# Patient Record
Sex: Male | Born: 1945 | Race: White | Hispanic: No | Marital: Married | Smoking: Former smoker
Health system: Southern US, Community
[De-identification: ages and names within clinical notes are randomized; demographics above are authoritative.]

## PROBLEM LIST (undated history)

## (undated) DIAGNOSIS — Z970 Presence of artificial eye: Secondary | ICD-10-CM

## (undated) DIAGNOSIS — Q646 Congenital diverticulum of bladder: Secondary | ICD-10-CM

## (undated) DIAGNOSIS — K759 Inflammatory liver disease, unspecified: Secondary | ICD-10-CM

## (undated) DIAGNOSIS — J309 Allergic rhinitis, unspecified: Secondary | ICD-10-CM

## (undated) DIAGNOSIS — C649 Malignant neoplasm of unspecified kidney, except renal pelvis: Secondary | ICD-10-CM

## (undated) DIAGNOSIS — L309 Dermatitis, unspecified: Secondary | ICD-10-CM

## (undated) DIAGNOSIS — Z87448 Personal history of other diseases of urinary system: Secondary | ICD-10-CM

## (undated) DIAGNOSIS — D649 Anemia, unspecified: Secondary | ICD-10-CM

## (undated) DIAGNOSIS — J189 Pneumonia, unspecified organism: Secondary | ICD-10-CM

## (undated) DIAGNOSIS — N281 Cyst of kidney, acquired: Secondary | ICD-10-CM

## (undated) DIAGNOSIS — Z8489 Family history of other specified conditions: Secondary | ICD-10-CM

## (undated) DIAGNOSIS — Z87442 Personal history of urinary calculi: Secondary | ICD-10-CM

## (undated) DIAGNOSIS — Z8719 Personal history of other diseases of the digestive system: Secondary | ICD-10-CM

## (undated) DIAGNOSIS — R51 Headache: Secondary | ICD-10-CM

## (undated) DIAGNOSIS — H5440 Blindness, one eye, unspecified eye: Secondary | ICD-10-CM

## (undated) DIAGNOSIS — T8859XA Other complications of anesthesia, initial encounter: Secondary | ICD-10-CM

## (undated) DIAGNOSIS — S42009A Fracture of unspecified part of unspecified clavicle, initial encounter for closed fracture: Secondary | ICD-10-CM

## (undated) DIAGNOSIS — T4145XA Adverse effect of unspecified anesthetic, initial encounter: Secondary | ICD-10-CM

## (undated) DIAGNOSIS — R519 Headache, unspecified: Secondary | ICD-10-CM

## (undated) DIAGNOSIS — M1990 Unspecified osteoarthritis, unspecified site: Secondary | ICD-10-CM

## (undated) DIAGNOSIS — J302 Other seasonal allergic rhinitis: Secondary | ICD-10-CM

## (undated) DIAGNOSIS — I10 Essential (primary) hypertension: Secondary | ICD-10-CM

## (undated) DIAGNOSIS — N201 Calculus of ureter: Secondary | ICD-10-CM

## (undated) HISTORY — PX: KNEE ARTHROSCOPY W/ MENISCECTOMY: SHX1879

## (undated) HISTORY — DX: Fracture of unspecified part of unspecified clavicle, initial encounter for closed fracture: S42.009A

## (undated) HISTORY — PX: FOOT FRACTURE SURGERY: SHX645

## (undated) HISTORY — PX: LUNG SURGERY: SHX703

## (undated) HISTORY — PX: CARPAL TUNNEL RELEASE: SHX101

## (undated) HISTORY — PX: LITHOTRIPSY: SUR834

## (undated) HISTORY — PX: EYE SURGERY: SHX253

## (undated) HISTORY — PX: HIP FRACTURE SURGERY: SHX118

## (undated) HISTORY — DX: Malignant neoplasm of unspecified kidney, except renal pelvis: C64.9

---

## 1993-10-03 HISTORY — PX: CERVICAL FUSION: SHX112

## 1998-08-20 ENCOUNTER — Ambulatory Visit (HOSPITAL_BASED_OUTPATIENT_CLINIC_OR_DEPARTMENT_OTHER): Admission: RE | Admit: 1998-08-20 | Discharge: 1998-08-20 | Payer: Self-pay | Admitting: General Surgery

## 1998-10-28 ENCOUNTER — Encounter: Payer: Self-pay | Admitting: Emergency Medicine

## 1998-10-28 ENCOUNTER — Emergency Department (HOSPITAL_COMMUNITY): Admission: EM | Admit: 1998-10-28 | Discharge: 1998-10-28 | Payer: Self-pay | Admitting: Emergency Medicine

## 1998-12-15 ENCOUNTER — Observation Stay (HOSPITAL_COMMUNITY): Admission: RE | Admit: 1998-12-15 | Discharge: 1998-12-16 | Payer: Self-pay | Admitting: Neurosurgery

## 1998-12-15 ENCOUNTER — Encounter: Payer: Self-pay | Admitting: Neurosurgery

## 1999-02-04 ENCOUNTER — Encounter: Payer: Self-pay | Admitting: Neurosurgery

## 1999-02-04 ENCOUNTER — Ambulatory Visit (HOSPITAL_COMMUNITY): Admission: RE | Admit: 1999-02-04 | Discharge: 1999-02-04 | Payer: Self-pay | Admitting: Neurosurgery

## 1999-06-28 ENCOUNTER — Ambulatory Visit (HOSPITAL_COMMUNITY): Admission: RE | Admit: 1999-06-28 | Discharge: 1999-06-28 | Payer: Self-pay | Admitting: Neurosurgery

## 1999-06-28 ENCOUNTER — Encounter: Payer: Self-pay | Admitting: Neurosurgery

## 1999-07-22 ENCOUNTER — Ambulatory Visit (HOSPITAL_COMMUNITY): Admission: RE | Admit: 1999-07-22 | Discharge: 1999-07-22 | Payer: Self-pay | Admitting: Neurosurgery

## 1999-07-22 ENCOUNTER — Encounter: Payer: Self-pay | Admitting: Neurosurgery

## 1999-09-21 ENCOUNTER — Ambulatory Visit (HOSPITAL_COMMUNITY): Admission: RE | Admit: 1999-09-21 | Discharge: 1999-09-21 | Payer: Self-pay | Admitting: Gastroenterology

## 2000-01-31 ENCOUNTER — Ambulatory Visit (HOSPITAL_COMMUNITY): Admission: RE | Admit: 2000-01-31 | Discharge: 2000-01-31 | Payer: Self-pay | Admitting: Orthopedic Surgery

## 2000-01-31 ENCOUNTER — Encounter: Payer: Self-pay | Admitting: Orthopedic Surgery

## 2000-02-16 ENCOUNTER — Encounter: Admission: RE | Admit: 2000-02-16 | Discharge: 2000-02-16 | Payer: Self-pay | Admitting: Urology

## 2000-02-16 ENCOUNTER — Encounter: Payer: Self-pay | Admitting: Urology

## 2000-06-09 ENCOUNTER — Encounter: Admission: RE | Admit: 2000-06-09 | Discharge: 2000-09-07 | Payer: Self-pay | Admitting: Anesthesiology

## 2001-03-07 ENCOUNTER — Encounter: Payer: Self-pay | Admitting: Sports Medicine

## 2001-03-07 ENCOUNTER — Encounter: Admission: RE | Admit: 2001-03-07 | Discharge: 2001-03-07 | Payer: Self-pay | Admitting: Family Medicine

## 2001-03-07 ENCOUNTER — Encounter: Admission: RE | Admit: 2001-03-07 | Discharge: 2001-03-07 | Payer: Self-pay | Admitting: Sports Medicine

## 2001-03-20 ENCOUNTER — Encounter: Admission: RE | Admit: 2001-03-20 | Discharge: 2001-03-20 | Payer: Self-pay | Admitting: Family Medicine

## 2001-03-27 ENCOUNTER — Encounter: Admission: RE | Admit: 2001-03-27 | Discharge: 2001-03-27 | Payer: Self-pay | Admitting: Family Medicine

## 2001-04-27 ENCOUNTER — Encounter: Admission: RE | Admit: 2001-04-27 | Discharge: 2001-04-27 | Payer: Self-pay | Admitting: Family Medicine

## 2003-01-03 ENCOUNTER — Encounter: Admission: RE | Admit: 2003-01-03 | Discharge: 2003-01-03 | Payer: Self-pay | Admitting: Family Medicine

## 2003-01-23 ENCOUNTER — Encounter: Admission: RE | Admit: 2003-01-23 | Discharge: 2003-01-23 | Payer: Self-pay | Admitting: Family Medicine

## 2003-02-27 ENCOUNTER — Ambulatory Visit (HOSPITAL_BASED_OUTPATIENT_CLINIC_OR_DEPARTMENT_OTHER): Admission: RE | Admit: 2003-02-27 | Discharge: 2003-02-28 | Payer: Self-pay | Admitting: Orthopedic Surgery

## 2003-02-27 HISTORY — PX: SHOULDER ARTHROSCOPY WITH OPEN ROTATOR CUFF REPAIR AND DISTAL CLAVICLE ACROMINECTOMY: SHX5683

## 2003-10-04 HISTORY — PX: NASAL SEPTUM SURGERY: SHX37

## 2003-11-27 ENCOUNTER — Encounter: Admission: RE | Admit: 2003-11-27 | Discharge: 2003-11-27 | Payer: Self-pay | Admitting: Sports Medicine

## 2003-11-27 ENCOUNTER — Encounter: Admission: RE | Admit: 2003-11-27 | Discharge: 2003-11-27 | Payer: Self-pay | Admitting: Family Medicine

## 2003-12-02 ENCOUNTER — Encounter: Admission: RE | Admit: 2003-12-02 | Discharge: 2003-12-02 | Payer: Self-pay | Admitting: Family Medicine

## 2004-01-01 ENCOUNTER — Encounter: Admission: RE | Admit: 2004-01-01 | Discharge: 2004-01-01 | Payer: Self-pay | Admitting: Family Medicine

## 2004-05-13 ENCOUNTER — Encounter: Admission: RE | Admit: 2004-05-13 | Discharge: 2004-05-13 | Payer: Self-pay | Admitting: Sports Medicine

## 2004-06-15 ENCOUNTER — Ambulatory Visit: Payer: Self-pay | Admitting: Family Medicine

## 2004-07-27 ENCOUNTER — Ambulatory Visit: Payer: Self-pay | Admitting: Family Medicine

## 2004-08-10 ENCOUNTER — Ambulatory Visit: Payer: Self-pay | Admitting: Family Medicine

## 2004-08-24 ENCOUNTER — Ambulatory Visit: Payer: Self-pay | Admitting: Family Medicine

## 2004-10-12 ENCOUNTER — Ambulatory Visit: Payer: Self-pay | Admitting: Family Medicine

## 2004-10-26 ENCOUNTER — Ambulatory Visit: Payer: Self-pay | Admitting: Family Medicine

## 2005-02-22 ENCOUNTER — Ambulatory Visit: Payer: Self-pay | Admitting: Family Medicine

## 2005-03-18 ENCOUNTER — Ambulatory Visit: Payer: Self-pay | Admitting: Family Medicine

## 2005-05-10 ENCOUNTER — Ambulatory Visit: Payer: Self-pay | Admitting: Family Medicine

## 2005-06-28 ENCOUNTER — Ambulatory Visit: Payer: Self-pay | Admitting: Sports Medicine

## 2005-09-13 ENCOUNTER — Ambulatory Visit: Payer: Self-pay | Admitting: Family Medicine

## 2005-12-27 ENCOUNTER — Ambulatory Visit: Payer: Self-pay | Admitting: Sports Medicine

## 2006-03-14 ENCOUNTER — Ambulatory Visit: Payer: Self-pay | Admitting: Family Medicine

## 2006-03-20 ENCOUNTER — Ambulatory Visit (HOSPITAL_COMMUNITY): Admission: RE | Admit: 2006-03-20 | Discharge: 2006-03-20 | Payer: Self-pay | Admitting: Sports Medicine

## 2006-03-22 ENCOUNTER — Ambulatory Visit: Payer: Self-pay | Admitting: Family Medicine

## 2006-05-24 ENCOUNTER — Ambulatory Visit: Payer: Self-pay | Admitting: Family Medicine

## 2006-06-26 ENCOUNTER — Ambulatory Visit: Payer: Self-pay | Admitting: Family Medicine

## 2006-08-15 ENCOUNTER — Ambulatory Visit: Payer: Self-pay | Admitting: Family Medicine

## 2006-11-30 DIAGNOSIS — J309 Allergic rhinitis, unspecified: Secondary | ICD-10-CM | POA: Insufficient documentation

## 2006-11-30 DIAGNOSIS — M899 Disorder of bone, unspecified: Secondary | ICD-10-CM | POA: Insufficient documentation

## 2006-11-30 DIAGNOSIS — M949 Disorder of cartilage, unspecified: Secondary | ICD-10-CM

## 2006-11-30 DIAGNOSIS — J45909 Unspecified asthma, uncomplicated: Secondary | ICD-10-CM

## 2006-11-30 DIAGNOSIS — M67919 Unspecified disorder of synovium and tendon, unspecified shoulder: Secondary | ICD-10-CM | POA: Insufficient documentation

## 2006-11-30 DIAGNOSIS — M719 Bursopathy, unspecified: Secondary | ICD-10-CM

## 2006-11-30 DIAGNOSIS — M479 Spondylosis, unspecified: Secondary | ICD-10-CM | POA: Insufficient documentation

## 2006-11-30 HISTORY — DX: Unspecified asthma, uncomplicated: J45.909

## 2007-02-28 ENCOUNTER — Telehealth (INDEPENDENT_AMBULATORY_CARE_PROVIDER_SITE_OTHER): Payer: Self-pay | Admitting: Family Medicine

## 2007-03-13 ENCOUNTER — Encounter (INDEPENDENT_AMBULATORY_CARE_PROVIDER_SITE_OTHER): Payer: Self-pay | Admitting: Family Medicine

## 2007-03-13 ENCOUNTER — Ambulatory Visit: Payer: Self-pay | Admitting: Family Medicine

## 2007-03-13 ENCOUNTER — Telehealth: Payer: Self-pay | Admitting: *Deleted

## 2007-03-13 ENCOUNTER — Encounter: Admission: RE | Admit: 2007-03-13 | Discharge: 2007-03-13 | Payer: Self-pay | Admitting: Sports Medicine

## 2007-03-13 DIAGNOSIS — M25579 Pain in unspecified ankle and joints of unspecified foot: Secondary | ICD-10-CM | POA: Insufficient documentation

## 2007-03-13 LAB — CONVERTED CEMR LAB
BUN: 15 mg/dL (ref 6–23)
CO2: 24 meq/L (ref 19–32)
Calcium: 8.7 mg/dL (ref 8.4–10.5)
Chloride: 110 meq/L (ref 96–112)
Creatinine, Ser: 0.71 mg/dL (ref 0.40–1.50)
Direct LDL: 106 mg/dL — ABNORMAL HIGH
Glucose, Bld: 75 mg/dL (ref 70–99)
HCT: 38.5 % — ABNORMAL LOW (ref 39.0–52.0)
Hemoglobin: 12.3 g/dL — ABNORMAL LOW (ref 13.0–17.0)
MCHC: 31.9 g/dL (ref 30.0–36.0)
MCV: 101 fL — ABNORMAL HIGH (ref 78.0–100.0)
Platelets: 370 10*3/uL (ref 150–400)
Potassium: 4.1 meq/L (ref 3.5–5.3)
RBC: 3.81 M/uL — ABNORMAL LOW (ref 4.22–5.81)
RDW: 13.7 % (ref 11.5–14.0)
Sodium: 143 meq/L (ref 135–145)
WBC: 9.6 10*3/uL (ref 4.0–10.5)

## 2007-03-14 ENCOUNTER — Encounter (INDEPENDENT_AMBULATORY_CARE_PROVIDER_SITE_OTHER): Payer: Self-pay | Admitting: Family Medicine

## 2007-03-21 ENCOUNTER — Encounter (INDEPENDENT_AMBULATORY_CARE_PROVIDER_SITE_OTHER): Payer: Self-pay | Admitting: Family Medicine

## 2007-03-21 ENCOUNTER — Ambulatory Visit: Payer: Self-pay | Admitting: Family Medicine

## 2007-03-22 ENCOUNTER — Encounter (INDEPENDENT_AMBULATORY_CARE_PROVIDER_SITE_OTHER): Payer: Self-pay | Admitting: Family Medicine

## 2007-03-22 ENCOUNTER — Ambulatory Visit (HOSPITAL_COMMUNITY): Admission: RE | Admit: 2007-03-22 | Discharge: 2007-03-22 | Payer: Self-pay | Admitting: Sports Medicine

## 2007-05-22 ENCOUNTER — Telehealth (INDEPENDENT_AMBULATORY_CARE_PROVIDER_SITE_OTHER): Payer: Self-pay | Admitting: Family Medicine

## 2007-06-05 ENCOUNTER — Ambulatory Visit: Payer: Self-pay | Admitting: Internal Medicine

## 2007-06-05 DIAGNOSIS — L821 Other seborrheic keratosis: Secondary | ICD-10-CM | POA: Insufficient documentation

## 2007-07-03 ENCOUNTER — Ambulatory Visit: Payer: Self-pay | Admitting: Family Medicine

## 2007-08-06 ENCOUNTER — Ambulatory Visit: Payer: Self-pay | Admitting: Family Medicine

## 2007-08-06 DIAGNOSIS — M818 Other osteoporosis without current pathological fracture: Secondary | ICD-10-CM | POA: Insufficient documentation

## 2007-08-06 DIAGNOSIS — M810 Age-related osteoporosis without current pathological fracture: Secondary | ICD-10-CM | POA: Insufficient documentation

## 2007-08-29 ENCOUNTER — Telehealth (INDEPENDENT_AMBULATORY_CARE_PROVIDER_SITE_OTHER): Payer: Self-pay | Admitting: Family Medicine

## 2007-10-10 ENCOUNTER — Ambulatory Visit: Payer: Self-pay | Admitting: Family Medicine

## 2007-10-11 ENCOUNTER — Encounter (INDEPENDENT_AMBULATORY_CARE_PROVIDER_SITE_OTHER): Payer: Self-pay | Admitting: Family Medicine

## 2007-10-12 ENCOUNTER — Telehealth: Payer: Self-pay | Admitting: *Deleted

## 2007-11-09 ENCOUNTER — Ambulatory Visit: Payer: Self-pay | Admitting: Family Medicine

## 2007-11-29 ENCOUNTER — Ambulatory Visit: Payer: Self-pay | Admitting: Family Medicine

## 2007-11-29 ENCOUNTER — Encounter (INDEPENDENT_AMBULATORY_CARE_PROVIDER_SITE_OTHER): Payer: Self-pay | Admitting: Family Medicine

## 2008-01-22 ENCOUNTER — Encounter: Admission: RE | Admit: 2008-01-22 | Discharge: 2008-01-22 | Payer: Self-pay | Admitting: Family Medicine

## 2008-01-22 ENCOUNTER — Ambulatory Visit: Payer: Self-pay | Admitting: Family Medicine

## 2008-01-25 ENCOUNTER — Telehealth: Payer: Self-pay | Admitting: *Deleted

## 2008-01-29 ENCOUNTER — Telehealth: Payer: Self-pay | Admitting: *Deleted

## 2008-01-31 ENCOUNTER — Telehealth: Payer: Self-pay | Admitting: *Deleted

## 2008-01-31 DIAGNOSIS — M47812 Spondylosis without myelopathy or radiculopathy, cervical region: Secondary | ICD-10-CM | POA: Insufficient documentation

## 2008-02-01 ENCOUNTER — Encounter (INDEPENDENT_AMBULATORY_CARE_PROVIDER_SITE_OTHER): Payer: Self-pay | Admitting: Family Medicine

## 2008-06-02 ENCOUNTER — Ambulatory Visit: Payer: Self-pay | Admitting: Family Medicine

## 2008-06-17 ENCOUNTER — Ambulatory Visit: Payer: Self-pay | Admitting: Family Medicine

## 2008-06-17 ENCOUNTER — Encounter: Payer: Self-pay | Admitting: Family Medicine

## 2008-06-17 DIAGNOSIS — G5600 Carpal tunnel syndrome, unspecified upper limb: Secondary | ICD-10-CM | POA: Insufficient documentation

## 2008-06-17 DIAGNOSIS — Z87448 Personal history of other diseases of urinary system: Secondary | ICD-10-CM | POA: Insufficient documentation

## 2008-06-17 DIAGNOSIS — Z87898 Personal history of other specified conditions: Secondary | ICD-10-CM

## 2008-06-17 HISTORY — DX: Personal history of other specified conditions: Z87.898

## 2008-07-03 ENCOUNTER — Ambulatory Visit (HOSPITAL_COMMUNITY): Admission: RE | Admit: 2008-07-03 | Discharge: 2008-07-03 | Payer: Self-pay | Admitting: Neurosurgery

## 2008-07-31 ENCOUNTER — Telehealth: Payer: Self-pay | Admitting: *Deleted

## 2008-08-18 ENCOUNTER — Encounter: Payer: Self-pay | Admitting: Family Medicine

## 2008-08-18 ENCOUNTER — Ambulatory Visit: Payer: Self-pay | Admitting: Family Medicine

## 2008-08-19 ENCOUNTER — Encounter: Payer: Self-pay | Admitting: Family Medicine

## 2008-08-19 LAB — CONVERTED CEMR LAB
BUN: 13 mg/dL (ref 6–23)
CO2: 23 meq/L (ref 19–32)
Calcium: 9.1 mg/dL (ref 8.4–10.5)
Chloride: 107 meq/L (ref 96–112)
Cholesterol: 156 mg/dL (ref 0–200)
Creatinine, Ser: 0.62 mg/dL (ref 0.40–1.50)
Glucose, Bld: 77 mg/dL (ref 70–99)
HDL: 47 mg/dL (ref >39)
LDL Cholesterol: 96 mg/dL (ref 0–99)
PSA: 1.33 ng/mL (ref 0.10–4.00)
Potassium: 4.3 meq/L (ref 3.5–5.3)
Sodium: 140 meq/L (ref 135–145)
Total CHOL/HDL Ratio: 3.3
Triglycerides: 63 mg/dL (ref <150)
VLDL: 13 mg/dL (ref 0–40)
Vit D, 1,25-Dihydroxy: 24 — ABNORMAL LOW (ref 30–89)

## 2008-08-21 ENCOUNTER — Ambulatory Visit (HOSPITAL_COMMUNITY): Admission: RE | Admit: 2008-08-21 | Discharge: 2008-08-21 | Payer: Self-pay | Admitting: Neurosurgery

## 2008-10-14 ENCOUNTER — Telehealth: Payer: Self-pay | Admitting: *Deleted

## 2008-11-14 ENCOUNTER — Encounter: Payer: Self-pay | Admitting: Family Medicine

## 2008-12-25 ENCOUNTER — Telehealth: Payer: Self-pay | Admitting: Family Medicine

## 2009-06-11 ENCOUNTER — Ambulatory Visit: Payer: Self-pay | Admitting: Family Medicine

## 2009-06-11 ENCOUNTER — Encounter: Payer: Self-pay | Admitting: Family Medicine

## 2009-06-11 DIAGNOSIS — E559 Vitamin D deficiency, unspecified: Secondary | ICD-10-CM

## 2009-06-11 DIAGNOSIS — L409 Psoriasis, unspecified: Secondary | ICD-10-CM | POA: Insufficient documentation

## 2009-06-11 HISTORY — DX: Vitamin D deficiency, unspecified: E55.9

## 2009-06-15 LAB — CONVERTED CEMR LAB: Vit D, 25-Hydroxy: 28 ng/mL — ABNORMAL LOW (ref 30–89)

## 2010-03-08 ENCOUNTER — Ambulatory Visit: Payer: Self-pay | Admitting: Family Medicine

## 2010-03-08 ENCOUNTER — Telehealth: Payer: Self-pay | Admitting: Family Medicine

## 2010-03-15 ENCOUNTER — Ambulatory Visit: Payer: Self-pay | Admitting: Family Medicine

## 2010-03-15 ENCOUNTER — Telehealth: Payer: Self-pay | Admitting: Family Medicine

## 2010-04-15 ENCOUNTER — Encounter: Payer: Self-pay | Admitting: *Deleted

## 2010-11-02 NOTE — Progress Notes (Signed)
Summary: refill  Phone Note Refill Request Call back at Home Phone [phone] Message from:  Patient  Refills Requested: Medication #1:  PROAIR HFA 108 (90 BASE) MCG/ACT  AERS 2 puffs 4 times daily as needed for shortness of breath.   Supply Requested: 6 months  Medication #2:  FLUTICASONE PROPIONATE 50 MCG/ACT SUSP Spray 1-2 spray into both nostrils twice a day.   Supply Requested: 6 months Initial call taken by: De Nurse,  December 25, 2008 11:18 AM  Follow-up for Phone Call        will forward message to MD. Follow-up by: Theresia Lo RN,  December 25, 2008 11:20 AM      Prescriptions: PROAIR HFA 108 (90 BASE) MCG/ACT  AERS (ALBUTEROL SULFATE) 2 puffs 4 times daily as needed for shortness of breath.  #4 x 1   Entered and Authorized by:   Ancil Boozer  MD   Signed by:   Ancil Boozer  MD on 12/25/2008   Method used:   [facility] to        [facility]  [address] [phone][facility] (retail[facility]       [address]       [place], [place]  [address]       Ph: [phone] or [phone]       Fax: [phone]   RxID:   [id number] FLUTICASONE PROPIONATE 50 MCG/ACT SUSP (FLUTICASONE PROPIONATE) Spray 1-2 spray into both nostrils twice a day.  #4 x 1   Entered and Authorized by:   Ancil Boozer  MD   Signed by:   Ancil Boozer  MD on 12/25/2008   Method used:   [facility]ronically to        [facility]  [address] [phone]* (retail)       [address]       [place], [place]  [address]       Ph: [phone] or [phone]       Fax: [phone]   RxID:   [id number]

## 2010-11-02 NOTE — Progress Notes (Signed)
Summary: Rx clarification  Phone Note From Pharmacy   Caller: [facility]  [address] [phone]* Summary of Call: Marylene Land [facility] [address] called with some questions about the Calcium with Vitamin D Rx they received.  She states the rx was written as SM calcium with Vitamin D 500/200 mg.  She says she is not sure what the SM means?  Does this mean small she asked.  Also she states the dosage that she has is 600/400 mg - she wants to know if this change in dosage would be ok and needs clarification on the SM abbreviation?  phone number [phone]. Initial call taken by: AMY MARTIN RN,  October 12, 2007 12:12 PM  Follow-up for Phone Call        Notified pharmacy.  Follow-up by: Rommie Dunn LPN,  October 15, 2007 9:50 AM      I am unaware of any SM on the script.  All pt needs is 1200 mg Ca and 800 International Units vit D daily for osteoporosis.    Maxwell Marion, MD

## 2010-11-02 NOTE — Miscellaneous (Signed)
Summary: call form pharmacy  Clinical Lists Changes  pharmacy called needing to verify an Rx that was sent in in Sept 2010 for Dovonex cream. they had misplaced the original . needed it for insurance audit. advised that RX was sent 06/11/2009 by Dr. Sandi Mealy  for Dovonex 0.005% apply to affected area twice daily. 60 gram   dispense #1 and 4 refills. Theresia Lo RN  April 15, 2010 9:26 AM

## 2010-11-02 NOTE — Assessment & Plan Note (Signed)
Summary: PFTs Rx Clinic   Vital Signs:  Patient Profile:   65 Years Old Male Height:     69.5 inches (175.26 cm) Weight:      153.8 pounds BMI:     22.47 Pulse rate:   107 / minute BP sitting:   128 / 83  (left arm)                 Asthma History:      He is now complaining of cough.  He denies nocturnal symptoms.  Immunizations:      Last Flu shot: Fluvax 3+ (07/04/2007)      Prior Medication List:  ADVAIR DISKUS 500-50 MCG/DOSE MISC (FLUTICASONE-SALMETEROL) Inhale 1 puff as directed twice a day ALBUTEROL 90 MCG/ACT AERS (ALBUTEROL) Inhale 2 puff using inhaler four times a day FLUTICASONE PROPIONATE 50 MCG/ACT SUSP (FLUTICASONE PROPIONATE) Spray 1 spray into both nostrils twice a day SM CALCIUM/VITAMIN D 500-200 MG-UNIT TABS (CALCIUM CARBONATE-VITAMIN D) Take 1 tablet by mouth twice a day ZYRTEC HIVES RELIEF 10 MG TABS (CETIRIZINE HCL) Take 1 tablet by mouth once a day PREDNISONE 5 MG  TABS (PREDNISONE) take 1 tab by mouth every other day BREATHERITE COLL SPACER ADULT   MISC (SPACER/AERO-HOLDING CHAMBERS) Use with inhaler at all times.  This helps to get more medicine in your lungs BONIVA 150 MG  TABS (IBANDRONATE SODIUM) 1 tab by mouth once monthly   Current Allergies (reviewed today): ! SULFA        Impression & Recommendations:  Problem # 1:  ASTHMA, UNSPECIFIED (ICD-493.90) Assessment: Deteriorated Previous smoker, 20 yph, quit in 1986.  Seen today to test for airway reversibility in hope of coming off from steriods for asthma care.  PFTs were noteworthy for moderate to severe obstruction with significant improvement in FVC an FEV1 after albuterol.  No change in Respiratory treatment plan today.    His updated medication list for this problem includes:    Advair Diskus 500-50 Mcg/dose Misc (Fluticasone-salmeterol) ..... Inhale 1 puff as directed twice a day    Albuterol 90 Mcg/act Aers (Albuterol) ..... Inhale 2 puff using inhaler four times a day  Prednisone 5 Mg Tabs (Prednisone) .Marland Kitchen... Take 1 tab by mouth every other day  Orders: Albuterol Sulfate Sol 1mg  unit dose (Z6109) PFT Baseline-Pre/Post Bronchodiolator (PFT Baseline-Pre/Pos)   Problem # 2:  OSTEOPENIA (ICD-733.90) Taking Boniva without complaint. Continues on prednisone at this time but is trying to taper off.  Willing to increase Calcium and Vitamin D to twice daily.   Consider Vitamin D level at next visit with primary MD.  His updated medication list for this problem includes:    Sm Calcium/vitamin D 500-200 Mg-unit Tabs (Calcium carbonate-vitamin d) .Marland Kitchen... Take 1 tablet by mouth twice a day    Boniva 150 Mg Tabs (Ibandronate sodium) .Marland Kitchen... 1 tab by mouth once monthly   Complete Medication List: 1)  Advair Diskus 500-50 Mcg/dose Misc (Fluticasone-salmeterol) .... Inhale 1 puff as directed twice a day 2)  Albuterol 90 Mcg/act Aers (Albuterol) .... Inhale 2 puff using inhaler four times a day 3)  Fluticasone Propionate 50 Mcg/act Susp (Fluticasone propionate) .... Spray 1-2 spray into both nostrils twice a day 4)  Sm Calcium/vitamin D 500-200 Mg-unit Tabs (Calcium carbonate-vitamin d) .... Take 1 tablet by mouth twice a day 5)  Zyrtec Hives Relief 10 Mg Tabs (Cetirizine hcl) .... Take 1 tablet by mouth once a day 6)  Prednisone 5 Mg Tabs (Prednisone) .... Take 1 tab by mouth  every other day 7)  Breatherite Coll Spacer Adult Misc (Spacer/aero-holding chambers) .... Use with inhaler at all times.  this helps to get more medicine in your lungs 8)  Boniva 150 Mg Tabs (Ibandronate sodium) .Marland Kitchen.. 1 tab by mouth once monthly  Asthma Management Plan:  Updated Asthma Medications:    Advair Diskus 500-50 Mcg/dose Misc (Fluticasone-salmeterol) ..... Inhale 1 puff as directed twice a day    Albuterol 90 Mcg/act Aers (Albuterol) ..... Inhale 2 puff using inhaler four times a day    Prednisone 5 Mg Tabs (Prednisone) .Marland Kitchen... Take 1 tab by mouth every other day    Patient Instructions:  1)  Follow up with Dr. Corliss Marcus in 2-4 weeks. 2)  Advair 500/50  one inhalation twice a day. 3)  Continue Flonase 1 spray each side twice daily.  Increase to 2 sprays each side as your seasonal allergies worsen.  4)  Continue to use your Albuterol prior to exercise. 5)  Continue to use your Albuterol as rescue.     ]    Pulmonary Function Test Date: 11/29/2007 Height (in.): 69.5 Gender: Male  Pre-Spirometry FVC    Value: 3.70 L/min   Pred: 4.11 L/min     % Pred: 89 % FEV1    Value: 1.90 L     Pred: 3.28 L     % Pred: 57 % FEV1/FVC  Value: 51 %     Pred: 80 %     % Pred: 64 % FEF 25-75  Value: 0.85 L/min   Pred: 3.33 L/min     % Pred: 25 %  Post-Spirometry FVC    Value: 4.71 L/min   Pred: 4.11 L/min     % Pred: 114 % FEV1    Value: 2.80 L     Pred: 3.28 L     % Pred: 85 % FEV1/FVC  Value: 59 %     Pred: 80 %     % Pred: 74 % FEF 25-75  Value: 1.23 L/min   Pred: 3.33 L/min     % Pred: 37 %  Comments: Effort - Exceptional   Evaluation: moderate obstruction with significant bronchodilator response  Recommendations: Continue current management plan.           Medication Administration  Medication # 1:    Medication: Albuterol Sulfate Sol 1mg  unit dose    Diagnosis: ASTHMA, UNSPECIFIED (ICD-493.90)    Route: inhaled    Exp Date: 04/02/2009    Lot #: [id number]    Mfr: Nephron    Comments: Pt. recieved 2.5mg /8mL vial    Patient tolerated medication without complications    Given by: Garen Grams LPN (November 29, 2007 10:06 AM)  Orders Added: 1)  Albuterol Sulfate Sol 1mg  unit dose [G2952] 2)  PFT Baseline-Pre/Post Bronchodiolator [PFT Baseline-Pre/Pos]

## 2010-11-02 NOTE — Miscellaneous (Signed)
  Clinical Lists Changes   This pt has an appt for a bone density scan on 03/22/07   please see red team box for hardcopy   03/21/07 Pt notified..........Marland Kitchen                gvasquez lpn

## 2010-11-02 NOTE — Progress Notes (Signed)
Summary: Triage  Phone Note Call from Patient Call back at Home Phone [phone]   Reason for Call: Talk to Nurse Summary of Call: pt seen last week for psorisis, not any better Initial call taken by: Knox Royalty,  March 15, 2010 9:35 AM  Follow-up for Phone Call        he is finished with the steroids wich helped but it is getting worse again. the infected areas are looking bad again. thinks he needs more prednisone & a different antibiotic. to come in now Follow-up by: Golden Circle RN,  March 15, 2010 9:42 AM      Vitals Entered By: Gladstone Pih (March 15, 2010 10:10 AM)

## 2010-11-02 NOTE — Letter (Signed)
Summary: Lipid Letter  [facility]  [address]   [place], [place] [address]   Phone: [phone]  Fax: [phone]    08/19/2008  Brian Leon [address] [place], [place]  [address]  Dear Brian Leon:  We have carefully reviewed your last lipid profile from 08/18/2008 and the results are noted below with a summary of recommendations for lipid management.    Cholesterol:       156     Goal: < 200   HDL "good" Cholesterol:   47     Goal: > 40   LDL "bad" Cholesterol:   96     Goal: < 130   Triglycerides:       63     Goal: < 150    Overall these are excellent numbers.  We also checked your kidney function, prostate test and vitamin D levels.  All of these were excellent except for your vitamin  D levels.  I am sending a prescription to your pharmacy for vitamin D for you to take once a week for the next month to help replete your vitamin D stores.  After this next month go back to taking 800 international units of vitamin D daily and we can recheck your levels at your next visit.    TLC Diet (Therapeutic Lifestyle Change): Saturated Fats & Transfatty acids should be kept < 7% of total calories ***Reduce Saturated Fats Polyunstaurated Fat can be up to 10% of total calories Monounsaturated Fat Fat can be up to 20% of total calories Total Fat should be no greater than 25-35% of total calories Carbohydrates should be 50-60% of total calories Protein should be approximately 15% of total calories Fiber should be at least 20-30 grams a day ***Increased fiber may help lower LDL Total Cholesterol should be < 200mg /day Consider adding plant stanol/sterols to diet (example: Benacol spread) ***A higher intake of unsaturated fat may reduce Triglycerides and Increase HDL    Adjunctive Measures (may lower LIPIDS and reduce risk of Heart Attack) include: Aerobic Exercise (20-30 minutes 3-4 times a week) Limit Alcohol Consumption Weight Reduction Aspirin 75-81 mg a day by mouth  (if not allergic or contraindicated) Vitamin E 400 IU a day by mouth Folic Acid 1mg  a day by mouth Dietary Fiber 20-30 grams a day by mouth     Current Medications: 1)    Advair Diskus 500-50 Mcg/dose Misc (Fluticasone-salmeterol) .... Inhale 1 puff as directed twice a day 2)    Fluticasone Propionate 50 Mcg/act Susp (Fluticasone propionate) .... Spray 1-2 spray into both nostrils twice a day.  last refill before visit with primary md 3)    Sm Calcium/vitamin D 500-200 Mg-unit Tabs (Calcium carbonate-vitamin d) .... Take 1 tablet by mouth twice a day 4)    Zyrtec Hives Relief 10 Mg Tabs (Cetirizine hcl) .... Take 1 tablet by mouth once a day 5)    Breatherite Coll Spacer Adult   Misc (Spacer/aero-holding chambers) .... Use with inhaler at all times.  this helps to get more medicine in your lungs 6)    Boniva 150 Mg  Tabs (Ibandronate sodium) .Marland Kitchen.. 1 tab by mouth once monthly 7)    Proair Hfa 108 (90 Base) Mcg/act  Aers (Albuterol sulfate) .... 2 puffs 4 times daily as needed for shortness of breath.  last refill before visit with primary md  If you have any questions, please call. We appreciate being able to work with you.   Sincerely,  [facility] Ancil Boozer  MD  Appended Document: Lipid Letter mailed

## 2010-11-02 NOTE — Progress Notes (Signed)
Summary: Returning Brian Leon's call  Phone Note Call from Patient Call back at Home Phone [phone]   Reason for Call: Talk to Nurse Summary of Call: pt is returning call to [facility] re: an appointment that was made Initial call taken by: ERIN LEVAN,  March 13, 2007 4:35 PM  Follow-up for Phone Call        Pt given information Follow-up by: [facility] CMA,  March 13, 2007 4:39 PM

## 2010-11-02 NOTE — Assessment & Plan Note (Signed)
Summary: asthma wp   Vital Signs:  Patient Profile:   65 Years Old Male Height:     69 inches (175.26 cm) Weight:      148.7 pounds Temp:     99.0 degrees F Pulse rate:   76 / minute BP sitting:   115 / 74  (left arm)  Pt. in pain?   no  Vitals Entered By: Jacki Cones RN (June 05, 2007 3:46 PM)                  Chief Complaint:  f/u asthma and bump on back.  History of Present Illness: Presents for asthma follow up on chronic pred 20 mg by mouth every other day for atleast 1-2 years, previously followed by Dr. Ilda Mori in pulm clinic here.  Pt states has not had any asthma flares in over 3-4 mos.  States using alb 3-4 times weekly and admits to nighttime coughing 1-2 nights per week.  No hx of previous intubations.  No known tobacco abuse.  Taking all meds withoun difficulty including, alb MDI, Advair 500/50 two times a day, flonase, zyrtec and pred 20 every other day.    Has OP sec to chronic steroid use.  Taking fosamax weekly.  Pt presents wanting to wean meds and ultimatley get off pred if possible.        Risk Factors:  Tobacco use:  quit    Year quit:  1986    Physical Exam  General:     Well-developed,well-nourished,in no acute distress; alert,appropriate and cooperative throughout examination Ears:     External ear exam shows no significant lesions or deformities.  Otoscopic examination reveals clear canals, tympanic membranes are intact bilaterally without bulging, retraction, inflammation or discharge. Hearing is grossly normal bilaterally. Nose:     External nasal examination shows no deformity or inflammation. Nasal mucosa are pink and moist without lesions or exudates. Neck:     No deformities, masses, or tenderness noted. Lungs:     No wheezing Heart:     Normal rate and regular rhythm. S1 and S2 normal without gallop, murmur, click, rub or other extra sounds.    Impression & Recommendations:  Problem # 1:  ASTHMA, UNSPECIFIED (ICD-493.90)  Assessment: Improved Mild/ Mod well controlled at current regimen.  See detailed pt instructions.  Last PFT FEV1 95 % predicted but FEV1/ FVC ratio 54% consistent with obstructive pattern as well.  May benefit repeat PFT in future once meds titrated.   The following medications were removed from the medication list:    Prednisone 5 Mg Tabs (Prednisone) .Marland Kitchen... 1 tab by mouth every other day.  keep scheduled follow up appt to address asthma    Prednisone 5 Mg Tabs (Prednisone) .Marland Kitchen... 3.5 tabs by mouth every other day for 2 weeks.    3 tabs by mouth for 2 weeks  His updated medication list for this problem includes:    Advair Diskus 500-50 Mcg/dose Misc (Fluticasone-salmeterol) ..... Inhale 1 puff as directed twice a day    Albuterol 90 Mcg/act Aers (Albuterol) ..... Inhale 2 puff using inhaler four times a day    Singulair 10 Mg Tabs (Montelukast sodium) .Marland Kitchen... Take 1 tablet by mouth once a day    Prednisone 5 Mg Tabs (Prednisone) .Marland Kitchen... Take 3.5 tabs by mouth every other day x 2 weeks  then decrease to 3 tabs by mouth every other day for 2 weeks.  Orders: [facility]- Est  Level 4 ([address]109)  Orders: [facility]- Est  Level 4 (11914)   Problem # 2:  OSTEOPENIA (ICD-733.90) Assessment: Deteriorated Osteoporosis T score -2.6 hip and -1.8 T- spine sec to chronic pred.  Cont Fosamax. Cont Ca/ Vit D 1200 mg 800 IU Vit D.   Attempt to wean pred as above.   His updated medication list for this problem includes:    Fosamax 70 Mg Tabs (Alendronate sodium) .Marland Kitchen... Take 1 tablet by mouth once a week    Sm Calcium/vitamin D 500-200 Mg-unit Tabs (Calcium carbonate-vitamin d) .Marland Kitchen... Take 1 tablet by mouth twice a day  Orders: [facility]- Est  Level 4 (78295)  Orders: [facility]- Est  Level 4 (62130)   Problem # 3:  RHINITIS, ALLERGIC (ICD-477.9) Assessment: Unchanged Stable His updated medication list for this problem includes:    Fluticasone Propionate 50 Mcg/act Susp (Fluticasone propionate) ..... Spray 1 spray into both nostrils  twice a day    Zyrtec Hives Relief 10 Mg Tabs (Cetirizine hcl) .Marland Kitchen... Take 1 tablet by mouth once a day  Orders: [facility]- Est  Level 4 (86578)   Problem # 4:  KERATOSIS, SEBORRHEIC NEC (ICD-702.19) Assessment: New small lesion on left back 0.5/0.5 cm.  Cont to follow.  No change in borders, no bleeding noticed.  Consider cryo in future if unchanged.   Orders: [facility]- Est  Level 4 (46962)   Problem # 5:  Preventive Health Care (ICD-V70.0) Inquire about colon CA screen on past.  Hgb borderline 12.3 in past.  Last LDL 115 03/2006.  Goal < 160.  Needs repeat next visit since > 1 yr.  Also gluc fasting 75 given chronic pred.    Complete Medication List: 1)  Advair Diskus 500-50 Mcg/dose Misc (Fluticasone-salmeterol) .... Inhale 1 puff as directed twice a day 2)  Albuterol 90 Mcg/act Aers (Albuterol) .... Inhale 2 puff using inhaler four times a day 3)  Fluticasone Propionate 50 Mcg/act Susp (Fluticasone propionate) .... Spray 1 spray into both nostrils twice a day 4)  Fosamax 70 Mg Tabs (Alendronate sodium) .... Take 1 tablet by mouth once a week 5)  Singulair 10 Mg Tabs (Montelukast sodium) .... Take 1 tablet by mouth once a day 6)  Sm Calcium/vitamin D 500-200 Mg-unit Tabs (Calcium carbonate-vitamin d) .... Take 1 tablet by mouth twice a day 7)  Zyrtec Hives Relief 10 Mg Tabs (Cetirizine hcl) .... Take 1 tablet by mouth once a day 8)  Prednisone 5 Mg Tabs (Prednisone) .... Take 3.5 tabs by mouth every other day x 2 weeks  then decrease to 3 tabs by mouth every other day for 2 weeks.   Patient Instructions: 1)  Follow up with primary physician in 3-4 weeks for asthma. 2)  Decrease prednisone dose as below.  DO NOT abruptly stop this medicine. 3)  Prednisone 5 mg take 3.5 tabs by mouth every other day x 2 weeks 4)  Then decrease to 5 mg tab 3 tabs by mouth every other day x 2 weeks. 5)  Return sooner for difficulty breathing or other concerns.   6)  Contiune all other meds as prescribed.       Prescriptions: PREDNISONE 5 MG  TABS (PREDNISONE) take 3.5 tabs by mouth every other day x 2 weeks  Then decrease to 3 tabs by mouth every other day for 2 weeks.  #90 x 0   Entered and Authorized by:   Glorianne Manchester MD   Signed by:   Glorianne Manchester MD on 06/05/2007   Method used:   Print then  Give to Patient   RxID:   [id number] PREDNISONE 5 MG  TABS (PREDNISONE) 3.5 tabs by mouth every other day for 2 weeks.    3 tabs by mouth for 2 weeks  #qs x 0   Entered and Authorized by:   Glorianne Manchester MD   Signed by:   Glorianne Manchester MD on 06/05/2007   Method used:   Print then Give to Patient   RxID:   [id number]   Appended Document: asthma wp Pt [facility] [address] [facility] we never gave him the rx at his appt on 9/2.  Appended Document: asthma wp Called in rx to pharmacy,  LMOVM informing pt.

## 2010-11-02 NOTE — Progress Notes (Signed)
Summary: triage  Phone Note Call from Patient Call back at Home Phone [phone]   Caller: Patient Summary of Call: Pt has rash and wondering if he can be seen today. Initial call taken by: Clydell Hakim,  March 08, 2010 10:40 AM  Follow-up for Phone Call        lm Follow-up by: Golden Circle RN,  March 08, 2010 12:04 PM  Additional Follow-up for Phone Call Additional follow up Details #1::        pt returned call. Additional Follow-up by: Clydell Hakim,  March 08, 2010 12:11 PM    Additional Follow-up for Phone Call Additional follow up Details #2::    has psorious very bad right now. did not like that last derm so he did not go back. states he has to have some relief or he will kill himself. worked in with Dr. Lafonda Mosses at 1:45 since she saw that pt this am... Follow-up by: Golden Circle RN,  March 08, 2010 12:19 PM

## 2010-11-02 NOTE — Assessment & Plan Note (Signed)
Summary: F/U VISIT/[facility]   Vital Signs:  Patient Profile:   65 Years Old Male Height:     69 inches (175.26 cm) Weight:      153 pounds Temp:     99.0 degrees F Pulse rate:   74 / minute BP sitting:   133 / 84  Pt. in pain?   no  Vitals Entered By: Jone Baseman CMA (August 06, 2007 10:40 AM)                  Chief Complaint:  f/u visit.  History of Present Illness: Presents for asthma follow up on chronic pred 20 mg by mouth every other day for atleast 1-2 years, previously followed by Dr. Ilda Mori in pulm clinic here.  Attempting slow pred wean since last visit 07/03/07.  Taking pred 5 mg 3 tabs by mouth every other day x 2 weeks, then decreased to Pred 5 mg 2.5 tabs every other day.   Asthma- using alb 3 times daily but not using spacer with med.  Advair 500/50 two times a day.  Night time symtpoms not bad at all per pts.  Awakened with wheezing twice weekly.  Also taking zyrtec daily. Not on singulair.  Has not felt any benefit from singulair in past.  Had annual eye exam ( on [address] [place].  pt states no evidence of glaucoma.  On chronic fosamax sec to OP sec to steroids.  Attmepted 5 mg 2 tabs every other day but unable to maintain this does sec to chronic pred.    Pt admits to increased itching since pred wean but also with dry skin noted on exam.  Recommend eucerin use.            Physical Exam  General:     Well-developed,well-nourished,in no acute distress; alert,appropriate and cooperative throughout examination Head:     Normocephalic and atraumatic without obvious abnormalities. No apparent alopecia or balding. Eyes:     No corneal or conjunctival inflammation noted. EOMI. Perrla. Funduscopic exam benign, without hemorrhages, exudates or papilledema. Vision grossly normal. Ears:     External ear exam shows no significant lesions or deformities.  Otoscopic examination reveals clear canals, tympanic membranes are intact bilaterally without bulging,  retraction, inflammation or discharge. Hearing is grossly normal bilaterally. Nose:     External nasal examination shows no deformity or inflammation. Nasal mucosa are pink and moist without lesions or exudates. Mouth:     Oral mucosa and oropharynx without lesions or exudates.  Teeth in good repair. Lungs:     Normal respiratory effort, chest expands symmetrically. Lungs are clear to auscultation, no crackles or wheezes. Heart:     Normal rate and regular rhythm. S1 and S2 normal without gallop, murmur, click, rub or other extra sounds. Skin:     SK on back smaleer < 0.3 cm    Impression & Recommendations:  Problem # 1:  ASTHMA, UNSPECIFIED (ICD-493.90) Assessment: Improved Cont slow pred wean see detailed pt instructions.  Mild/ Mod well controlled at current regimen.    Last PFT FEV1 95 % predicted but FEV1/ FVC ratio 54% consistent with obstructive pattern as well.  May benefit repeat PFT in future once meds titrated.  Very compliant pt.  Educated on importance of spacer use with MDI. Pt voiced understanding.  Can always call to be seen sooner if problems arise.  pt reassured.     The following medications were removed from the medication list:    Singulair 10  Mg Tabs (Montelukast sodium) .Marland Kitchen... Take 1 tablet by mouth once a day  His updated medication list for this problem includes:    Advair Diskus 500-50 Mcg/dose Misc (Fluticasone-salmeterol) ..... Inhale 1 puff as directed twice a day    Albuterol 90 Mcg/act Aers (Albuterol) ..... Inhale 2 puff using inhaler four times a day    Prednisone 5 Mg Tabs (Prednisone) .Marland Kitchen... Take 2.5  tabs by mouth every other day x 2 weeks  then decrease to 2  tabs by mouth every other day.  Orders: [facility]- Est  Level 4 (57846)    Problem # 2:  RHINITIS, ALLERGIC (ICD-477.9) Assessment: Unchanged Refill flonase/ Cont zyrtec His updated medication list for this problem includes:    Fluticasone Propionate 50 Mcg/act Susp (Fluticasone propionate)  ..... Spray 1 spray into both nostrils twice a day    Zyrtec Hives Relief 10 Mg Tabs (Cetirizine hcl) .Marland Kitchen... 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  Recommend sunscreen.  Pt works as Education administrator.   Orders: [facility]- Est  Level 4 (28413)   Problem # 4:  OTHER OSTEOPOROSIS (ICD-733.09) Assessment: Unchanged Osteoporosis T score -2.6 hip and -1.8 T- spine sec to chronic pred.  Cont Fosamax with Ca/ Vit D.    Attempt to wean pred as above.  Repeat screen in 1 yr at previous location for comparison 03/2008. [facility].   His updated medication list for this problem includes:    Fosamax 70 Mg Tabs (Alendronate sodium) .Marland Kitchen... Take 1 tablet by mouth once a week    Sm Calcium/vitamin D 500-200 Mg-unit Tabs (Calcium carbonate-vitamin d) .Marland Kitchen... Take 1 tablet by mouth twice a day  Orders: [facility]- Est  Level 4 (24401)   Problem # 5:  Preventive Health Care (ICD-V70.0) We still need colonoscopy reults and results from annual eye exam.  Asked pt to have results faxed again to day.  If do not have prior to next visit.  Sign consent for release of info for record completion etc.  Received flu shot last visit.    Complete Medication List: 1)  Advair Diskus 500-50 Mcg/dose Misc (Fluticasone-salmeterol) .... Inhale 1 puff as directed twice a day 2)  Albuterol 90 Mcg/act Aers (Albuterol) .... Inhale 2 puff using inhaler four times a day 3)  Fluticasone Propionate 50 Mcg/act Susp (Fluticasone propionate) .... Spray 1 spray into both nostrils twice a day 4)  Fosamax 70 Mg Tabs (Alendronate sodium) .... Take 1 tablet by mouth once a week 5)  Sm Calcium/vitamin D 500-200 Mg-unit Tabs (Calcium carbonate-vitamin d) .... Take 1 tablet by mouth twice a day 6)  Zyrtec Hives Relief 10 Mg Tabs (Cetirizine hcl) .... Take 1 tablet by mouth once a day  7)  Prednisone 5 Mg Tabs (Prednisone) .... Take 2.5  tabs by mouth every other day x 2 weeks  then decrease to 2  tabs by mouth every other day. 8)  Breatherite Coll Spacer Adult Misc (Spacer/aero-holding chambers) .... Use with inhaler at all times.  this helps to get more medicine in your lungs   Patient Instructions: 1)  Follow up with regular physician Renato GailsOkey Dupre) in 2 mos for asthma. 2)  Wean prednisone as discussed 2.5 tabs every other day for 2 weeks.  Then 2 tabs every other day. 3)  Cont  all other meds as prescribed 4)  Use spacer with your inhaler. 5)  Will refill. Advair/ flonase/ Pred throught the computer [facility]. 6)  Start eucerin for dry skin 7)  Have colonscopy results faxed to [place] @ [phone].    Prescriptions: FLUTICASONE PROPIONATE 50 MCG/ACT SUSP (FLUTICASONE PROPIONATE) Spray 1 spray into both nostrils twice a day  #1 canister x 2   Entered and Authorized by:   Glorianne Manchester MD   Signed by:   Glorianne Manchester MD on 08/06/2007   Method used:   Electronically sent to ...       [id number]       [address]       [place], [place]  [address]       Ph: [phone] or [phone]       Fax: [phone]   RxID:   [id number] BREATHERITE COLL SPACER ADULT   MISC (SPACER/AERO-HOLDING CHAMBERS) Use with inhaler at all times.  This helps to get more medicine in your lungs  #2 x 0   Entered and Authorized by:   Glorianne Manchester MD   Signed by:   Glorianne Manchester MD on 08/06/2007   Method used:   Electronically sent to ...       [id number]       [address]       [place], [place]  [address]       Ph: [phone] or [phone]       Fax: [phone]   RxID:   [id number] ADVAIR DISKUS 500-50 MCG/DOSE MISC (FLUTICASONE-SALMETEROL) Inhale 1 puff as directed twice a day  #1 x 1   Entered and Authorized by:   Glorianne Manchester MD   Signed by:   Glorianne Manchester MD on 08/06/2007   Method used:   Electronically sent to ...       [facility] [phone]*       [address]       [place], [place]  [address]       Ph: [phone] or [phone]       Fax: [phone]   RxID:   [id number] PREDNISONE 5 MG  TABS (PREDNISONE) take 2.5  tabs by mouth every other day x 2 weeks  Then decrease to 2  tabs by mouth every other day.  #31 x 1   Entered and Authorized by:   Glorianne Manchester MD   Signed by:   Glorianne Manchester MD on 08/06/2007   Method used:   Electronically sent to ...       [id number]       [address]       [place], [place]  [address]       Ph: [phone] or [phone]       Fax: [phone]   RxID:   [id number]  ]

## 2010-11-02 NOTE — Assessment & Plan Note (Signed)
Summary: psorisis is VERY bad per pt/[place]/alm   Vital Signs:  Patient profile:   65 year old male Height:      69.5 inches Weight:      140.13 pounds Temp:     98.3 degrees F oral Pulse rate:   74 / minute BP sitting:   135 / 77  (right arm)  Vitals Entered By: Terese Door (March 08, 2010 2:10 PM) CC: psorisis Is Patient Diabetic? No   Primary Care Provider:  Ancil Boozer  MD  CC:  psorisis.  History of Present Illness: 1.  rash--psoriasis for a long time.  seen by derm 2/10 and prescribed dovanex and clobetasol.  got some better after starting those, but  never completely resolved.  getting worse past 3 months.  very itchy.  scratching a lot.  never went back to derm.    Habits & Providers  Alcohol-Tobacco-Diet     Tobacco Status: quit > 6 months  Current Medications (verified): 1)  Proair Hfa 108 (90 Base) Mcg/act  Aers (Albuterol Sulfate) .... 2 Puffs 4 Times Daily As Needed For Shortness of Breath. 2)  Advair Diskus 500-50 Mcg/dose Misc (Fluticasone-Salmeterol) .... Inhale 1 Puff As Directed Twice A Day 3)  Fluticasone Propionate 50 Mcg/act Susp (Fluticasone Propionate) .... Spray 1-2 Spray Into Both Nostrils Twice A Day. 4)  Zyrtec Hives Relief 10 Mg Tabs (Cetirizine Hcl) .... Take 1 Tablet By Mouth Once A Day 5)  Breatherite Coll Spacer Adult   Misc (Spacer/aero-Holding Chambers) .... Use With Inhaler At All Times.  This Helps To Get More Medicine in Your Lungs 6)  Sm Calcium/vitamin D 500-200 Mg-Unit Tabs (Calcium Carbonate-Vitamin D) .... Take 1 Tablet By Mouth Twice A Day 7)  Clobetasol Propionate 0.05 % Crea (Clobetasol Propionate) .... Apply To Affected Area Two Times A Day.  Disp 60g 8)  Dovonex 0.005 % Soln (Calcipotriene) .... Apply To Affected Are Two Times A Day. Disp 60g 9)  Flomax 0.4 Mg Caps (Tamsulosin Hcl) .Marland Kitchen.. 1 By Mouth Once Daily For Prostate 10)  Prednisone 10 Mg Tabs (Prednisone) .... 3 Tabs By Mouth Daily For 7 Days 11)  Doxycycline Hyclate 100 Mg  Caps (Doxycycline Hyclate) .Marland Kitchen.. 1 Tab By Mouth Two Times A Day For 7 Days.  Allergies: 1)  ! Sulfa  Social History: Smoking Status:  quit > 6 months  Review of Systems  The patient denies fever and weight gain.   Derm:  Complains of itching and rash; denies hair loss.  Physical Exam  General:  Well-developed,well-nourished,in no acute distress; alert,appropriate and cooperative throughout examination Skin:  diffuse rash on legs arms, back.  red, with some mild scale.  lots of excoriated lesions.  no pus Additional Exam:  vital signs reviewed    Impression & Recommendations:  Problem # 1:  PSORIASIS (ICD-696.1) Assessment Deteriorated extensive.  examined and discussed with Dr. McDiarmid.  refer back to derm.  prednisone burst.  continue creams.  doxy for possible secondary infection Orders: Dermatology Referral (Derm[facility]) [facility]- Est Level  3 ([address])  Complete Medication List: 1)  Proair Hfa 108 (90 Base) Mcg/act Aers (Albuterol sulfate) .... 2 puffs 4 times daily as needed for shortness of breath. 2)  Advair Diskus 500-50 Mcg/dose Misc (Fluticasone-salmeterol) .... Inhale 1 puff as directed twice a day 3)  Fluticasone Propionate 50 Mcg/act Susp (Fluticasone propionate) .... Spray 1-2 spray into both nostrils twice a day. 4)  Zyrtec Hives Relief 10 Mg Tabs (Cetirizine hcl) .... Take 1 tablet by  mouth once a day 5)  Breatherite Coll Spacer Adult Misc (Spacer/aero-holding chambers) .... Use with inhaler at all times.  this helps to get more medicine in your lungs 6)  Sm Calcium/vitamin D 500-200 Mg-unit Tabs (Calcium carbonate-vitamin d) .... Take 1 tablet by mouth twice a day 7)  Clobetasol Propionate 0.05 % Crea (Clobetasol propionate) .... Apply to affected area two times a day.  disp 60g 8)  Dovonex 0.005 % Soln (Calcipotriene) .... Apply to affected are two times a day. disp 60g 9)  Flomax 0.4 Mg Caps (Tamsulosin hcl) .Marland Kitchen.. 1 by mouth once daily for prostate 10)  Prednisone 10 Mg  Tabs (Prednisone) .... 3 tabs by mouth daily for 7 days 11)  Doxycycline Hyclate 100 Mg Caps (Doxycycline hyclate) .Marland Kitchen.. 1 tab by mouth two times a day for 7 days.  Patient Instructions: 1)  It was nice to see you today. 2)  For your psoriasis, take the prednisone I prescribed you for the next 7 days. 3)  We will refer you to dermatology. 4)  Take the doxycycline I prescribed you in case of infection. 5)  Keep using your creams. 6)  You can take benedryl or hydroxyzine for itching Prescriptions: DOXYCYCLINE HYCLATE 100 MG CAPS (DOXYCYCLINE HYCLATE) 1 tab by mouth two times a day for 7 days.  #14 x 0   Entered and Authorized by:   Asher Muir MD   Signed by:   Asher Muir MD on 03/08/2010   Method used:   [facility]        [facility] [address] [phone][facility] (retail)       [address]       [place], [place]  [address]       Ph: [phone] or [phone]       Fax: [phone]   RxID:   [id number] PREDNISONE 10 MG TABS (PREDNISONE) 3 tabs by mouth daily for 7 days  #21 x 0   Entered and Authorized by:   Asher Muir MD   Signed by:   Asher Muir MD on 03/08/2010   Method used:   [facility]        [facility] [address] [phone]* (retail)       [address]       [place], [place]  [address]       Ph: [phone] or [phone]       Fax: [phone]   RxID:   [id number]

## 2010-11-02 NOTE — Progress Notes (Signed)
Summary: Prednisone  Phone Note Call from Patient   Summary of Call: pt has an appt on 6/10, but will run out of predisone before appt - has enough for 5 days [facility] Initial call taken by: Haydee Salter,  Feb 28, 2007 11:18 AM  Follow-up for Phone Call        Can we pull his chart? This is Dr.Crawfors patient so I am not familiar with him Follow-up by: Altamese Cabal MD,  Mar 01, 2007 9:32 AM  Additional Follow-up for Phone Call Additional follow up Details #1::        Renewed for 15 mg by mouth every other day .  No refills.  Please contact patient and inform Additional Follow-up by: Altamese Cabal MD,  Mar 01, 2007 9:44 AM   Additional Follow-up for Phone Call Additional follow up Details #2::    Phone call to pt -  left voicemail that Rx has been sent. Follow-up by: AMY MARTIN RN,  Mar 01, 2007 10:10 AM

## 2010-11-02 NOTE — Assessment & Plan Note (Signed)
Summary: [facility] FROM 02/20/07 [facility]   Vital Signs:  Patient Profile:   65 Years Old Male Height:     69 inches (175.26 cm) Weight:      152.7 pounds (69.41 kg) BMI:     22.63 Temp:     98.7 degrees F (37.06 degrees C) Pulse rate:   75 / minute BP sitting:   124 / 78  (left arm)  Pt. in pain?   no  Vitals Entered By: Tomasa Rand (March 13, 2007 1:56 PM)                Chief Complaint:  leg rash/ swollen ankle and asthma.  History of Present Illness: Rash lower ext- started in Feb after hotel stay.  Pruritic in nature starts as hair bump and has spread to uooer thigh and arms and lower back.  Attempted hydrocortisone cream that helps the itch , but does not remove the symptoms.  No drainage or blistering noted.  No pets.  Denies fever.    Right ankle swelling- 1 week ago.  No hx of trauma.  Fell off ladder > 20 yrs ago with injury to opposite  ankle.  No pop or crack always able to ambulate on ankle.  Swelling decreased with change in show to sneaker .  Taking ibuprofen and elevating foot.    Asthma- states had flare in april increased pred to 8 mg every other day x 1 week.  Now on 4 mg every other day.  Using alb 10 times weekly atleast 12 hr apart.  No nighttime symptoms.  No wheezing, cough symptoms improved.  FEV1 on spiro 08/2006 95% predicted.  Stable on alb MDI, advair 500/50 two times a day, flonase two times a day, zrtec and singulair.  Pt occassionally misses these doses.  No ED visits or intubations for asthma.    OP sec chronic steroid use. last DEXA T score -1.9.  03/2006.  Taking fosamax, but has missed last several doses.  States has hard time keepin track of meds.        Risk Factors:  Tobacco use:  never    Physical Exam  General:     Well-developed,well-nourished,in no acute distress; alert,appropriate and cooperative throughout examination Lungs:     Normal respiratory effort, chest expands symmetrically. Lungs are clear to auscultation, no crackles or  wheezes. Heart:     Normal rate and regular rhythm. S1 and S2 normal without gallop, murmur, click, rub or other extra sounds. Skin:     multiple papules with excoriation an erythema, some with atleast .5 cm of induration.     Foot/Ankle Exam  General:    mild swelling over lat malleolus right ankle  Gait:    Normal heel-toe gait pattern bilaterally.    Skin:    Intact with no erythema; no scarring.    Inspection:    plantigrade foot with normal alignment of leg, ankle, hindfoot, and foot.   Palpation:    non-tender to palpation over distal leg, medial and lateral ankle, distal achilles tendon, medial and lateral hindfoot, posterior heel, plantar heel, midfoot and forefoot bilaterally.   Vascular:    dorsalis pedis and posterior tibial pulses 2+ and symmetric, capillary refill < 2 seconds, normal hair pattern, no evidence of ischemia.     Impression & Recommendations:  Problem # 1:  SKIN RASH (ICD-782.1) Folliculitis- no systemic symptoms today.  Doxy 100 two times a day 7 days Orders: [facility]- Est  Level 4 ([address])  Problem # 2:  ASTHMA, UNSPECIFIED (ICD-493.90) mild well controlled.  Cont current meds alb mdi q 4 as needed, advair 500/50 two times a day and pred 4 qod.  Treat allergy component.  flonase two times a day, zyrtec 10 and sungulair 10.  Want to see pt controlled for 3 mos.  Then step down therapy Orders: [facility]- Est  Level 4 (40102)   Problem # 3:  RHINITIS, ALLERGIC (ICD-477.9) see asthma above. Orders: [facility]- Est  Level 4 (72536)   Problem # 4:  OSTEOPENIA (ICD-733.90) osteoporosis t score -1.9 03/2006.  Cont fosamax 70 mg weekly in upright position.  Repeat DEXA Cont Ca/ vit d 1000-1200 mg daily Orders: [facility]- Est  Level 4 (99214) Dexa scan (Dexa scan)   Problem # 5:  SCREENING FOR CARDIOVASCULAR CONDITION NEC (ICD-V81.2) Last LDL 115 goal < 160 with no risk factors 03/2006.  Repeat today. with CBC/BMET.  cOLON cA SCREEN 05/2006.   Orders: [facility]- Est  Level  4 ([address]) CBC-[facility] (47425) Basic Met-[facility] ([address]38-75643) Direct LDL-[facility] (32951-88416)   Other Orders: Diagnostic X-Ray/Fluoroscopy (Diagnostic X-Ray/Flu)   Patient Instructions: 1)  Please schedule a follow-up appointment in 3 months for asthma.  Call sooner for appt if needed. 2)  Take all meds as prescribed. 3)  We will set up DEXA scan for your bones to compare to previous study. 4)  Have x- ray of right foot performed within 1-2  days.  Attempt ACE wrap for ankle.  We will contact you with abnormal results. 5)  Take doxycycline 100 mg two times a day for 7 days for folliculitis.  Return if no improvement, fever or other concerns.

## 2010-11-02 NOTE — Letter (Signed)
Summary: Results Follow-up Letter  [facility]  [address]   [place], [place] [address]   Phone: [phone]  Fax:     03/14/2007  [address] [place], [place]  [address]  Dear Brian Leon,   The following are the results of your recent test(s):  Cholesterol LDL(Bad cholesterol):  106        Your goal is less than:    160     HDL (Good cholesterol):        Your goal is more than: _________________________________________________________ Other Tests: Normal chemistry panel.  Normal kidney function and glucose.  You do not have diabetes.  Your hemoglobin was low at 12.3 mg/dl.  Keep you scheduled appointment to discuss further.  Additional evaluation will be needed for this.  Seek medical advise if chest pain, short of breath, or syncope.  Foot x-rays revealed no evidence of fracture.     _________________________________________________________  Please call for an appointment Or _________________________________________________________ _________________________________________________________ _________________________________________________________  Sincerely,  Keshauna Degraffenreid Corliss Marcus MD [facility]           Appended Document: Results Follow-up Letter mailed to patient

## 2010-11-02 NOTE — Progress Notes (Signed)
Summary: Rx  Phone Note Refill Request Call back at Home Phone [phone]   proair and nasal [facility] [address], needs sent correctly this afternoon if possible w/enough refills to last him until his next f/u in 6months.  Initial call taken by: Haydee Salter,  July 31, 2008 3:08 PM  Follow-up for Phone Call        Called in Rx as before. Follow-up by: Jone Baseman CMA,  July 31, 2008 3:37 PM

## 2010-11-02 NOTE — Assessment & Plan Note (Signed)
Summary: F/U [facility]   Vital Signs:  Patient Profile:   65 Years Old Male Height:     69 inches (175.26 cm) Weight:      151.9 pounds BMI:     22.51 Temp:     99.2 degrees F Pulse rate:   82 / minute BP sitting:   113 / 72  Pt. in pain?   no  Vitals Entered By: Golden Circle RN (November 09, 2007 1:57 PM)                  PCP:  Glorianne Manchester MD  Chief Complaint:  weaning off prednisone.  History of Present Illness: Presents for asthma follow up on chronic pred 20 mg by mouth every other day for atleast 1-2 years, previously followed by Dr. Ilda Mori in pulm clinic here.  Attempting slow pred wean since last visit 07/03/07.  Taking pred 5 mg 1 by mouth every other day x 2 weeks now.   Asthma- using alb atleast bid with spacer.  Advair 500/50 two times a day.  Night time symtpoms not bad at all per pts.   Also taking zyrtec daily. Not on singulair.    Had annual eye exam ( on [address] [place].  pt states no evidence of glaucoma.  On chronic fosamax sec to OP sec to steroids.  States had productive cough about 2 weeks ago after attending inauguration and had to take 1.5 pred every other day during that time, but has since decreased back to Pred 5 mg 1 tab every other day.  Allergies-Has had allergy testing and shots in past, but states "shots did not help".  Pt is uncertain which allergist.  still using flonase and zyrtec  OP- stable on Boniva.  Had difficulty remembering fosamax last visit.  Seems to be doing better with monthly med.              Physical Exam  General:     Well-developed,well-nourished,in no acute distress; alert,appropriate and cooperative throughout examination Head:     Normocephalic and atraumatic without obvious abnormalities. No apparent alopecia or balding. Eyes:     No corneal or conjunctival inflammation noted. EOMI. Perrla. Funduscopic exam benign, without hemorrhages, exudates or papilledema. Vision grossly normal. Ears:  External ear exam shows no significant lesions or deformities.  Otoscopic examination reveals clear canals, tympanic membranes are intact bilaterally without bulging, retraction, inflammation or discharge. Hearing is grossly normal bilaterally. Nose:     External nasal examination shows no deformity or inflammation. Nasal mucosa are pink and moist without lesions or exudates. Mouth:     Oral mucosa and oropharynx without lesions or exudates.  Teeth in good repair. Lungs:     Normal respiratory effort, chest expands symmetrically. Lungs are clear to auscultation, no crackles or wheezes. Heart:     Normal rate and regular rhythm. S1 and S2 normal without gallop, murmur, click, rub or other extra sounds. Skin:     large pigmented nevi on upper back.  No change per pt.      Impression & Recommendations:  Problem # 1:  ASTHMA, UNSPECIFIED (ICD-493.90) Assessment: Improved Recheck PFT since pt doing very well with pred wean in pharm clinic.  Follow closely given this is flu/ cough season.  Pt very compliant with care and informed about dz process.  Cont slow pred wean see detailed pt instructions.  Mild/ Mod well controlled at current regimen.    Last PFT FEV1 95 % predicted but FEV1/FVC  ratio 54% consistent with obstructive pattern as well.  Well get pharm assist with Pred wean.  If pt able to take Pred 5 mg 1 tab every other day x 1.5 month, may be ready to come off.   Discuss with pharm and preceptor prior to final taper.    His updated medication list for this problem includes:    Advair Diskus 500-50 Mcg/dose Misc (Fluticasone-salmeterol) ..... Inhale 1 puff as directed twice a day    Albuterol 90 Mcg/act Aers (Albuterol) ..... Inhale 2 puff using inhaler four times a day    Prednisone 5 Mg Tabs (Prednisone) .Marland Kitchen... Take 1 tab by mouth every other day      Problem # 2:  OTHER OSTEOPOROSIS (ICD-733.09) Assessment: Unchanged  Osteoporosis T score -2.6 hip and -1.8 T- spine sec to chronic  pred.  Cont Fosamax with Ca/ Vit D.    Attempt to wean pred as above.  Repeat screen in 1 yr at previous location for comparison 03/2008. [facility].    His updated medication list for this problem includes:    Sm Calcium/vitamin D 500-200 Mg-unit Tabs (Calcium carbonate-vitamin d) .Marland Kitchen... Take 1 tablet by mouth twice a day    Boniva 150 Mg Tabs (Ibandronate sodium) .Marland Kitchen... 1 tab by mouth once monthly  Orders: [facility]- Est  Level 4 (24401)   Problem # 3:  RHINITIS, ALLERGIC (ICD-477.9) Assessment: Unchanged  His updated medication list for this problem includes:    Fluticasone Propionate 50 Mcg/act Susp (Fluticasone propionate) ..... Spray 1 spray into both nostrils twice a day    Zyrtec Hives Relief 10 Mg Tabs (Cetirizine hcl) .Marland Kitchen... Take 1 tablet by mouth once a day  Orders: [facility]- Est  Level 4 (02725)   Problem # 4:  Preventive Health Care (ICD-V70.0) UTD colon screen.  Received flu and pneumovax  Complete Medication List: 1)  Advair Diskus 500-50 Mcg/dose Misc (Fluticasone-salmeterol) .... Inhale 1 puff as directed twice a day 2)  Albuterol 90 Mcg/act Aers (Albuterol) .... Inhale 2 puff using inhaler four times a day 3)  Fluticasone Propionate 50 Mcg/act Susp (Fluticasone propionate) .... Spray 1 spray into both nostrils twice a day 4)  Sm Calcium/vitamin D 500-200 Mg-unit Tabs (Calcium carbonate-vitamin d) .... Take 1 tablet by mouth twice a day 5)  Zyrtec Hives Relief 10 Mg Tabs (Cetirizine hcl) .... Take 1 tablet by mouth once a day 6)  Prednisone 5 Mg Tabs (Prednisone) .... Take 1 tab by mouth every other day 7)  Breatherite Coll Spacer Adult Misc (Spacer/aero-holding chambers) .... Use with inhaler at all times.  this helps to get more medicine in your lungs 8)  Boniva 150 Mg Tabs (Ibandronate sodium) .Marland Kitchen.. 1 tab by mouth once monthly   Patient Instructions: 1)  Follow up with Dr. Corliss Marcus in 2 mos for asthma 2)  No changes made in meds today. 3)  Continue Pred 5 mg 1 tab every other  day. 4)  Have lung function test performed with pharmacy clinic within next 2-3 weeks. 5)  If difficulty breathing, wheezing, fever or other concerns return sooner.      Prescriptions: PREDNISONE 5 MG  TABS (PREDNISONE) take 1 tab by mouth every other day  #40 x 0   Entered and Authorized by:   Glorianne Manchester MD   Signed by:   Glorianne Manchester MD on 11/09/2007   Method used:   Print then Give to Patient   RxID:   [id number] PREDNISONE 5 MG  TABS (PREDNISONE)  take 1 tab by mouth every other day  #40 x 0   Entered and Authorized by:   Glorianne Manchester MD   Signed by:   Glorianne Manchester MD on 11/09/2007   Method used:   Print then Give to Patient   RxID:   [id number]  ]

## 2010-11-02 NOTE — Progress Notes (Signed)
Summary: question re: rx   Phone Note Call from Patient Call back at Home Phone [phone]   Reason for Call: Talk to Nurse Summary of Call: pt sts he is not to see his doctor for 3 months and was given an rx for prednisone but was only given a 20 day supply, pt needs to know why this happened and if he'll get enough until he is suppose to see MD? [facility] Initial call taken by: ERIN LEVAN,  May 22, 2007 2:21 PM  Follow-up for Phone Call        states he has been on prednisone for a long time ( Dr.Larry Slotnick who died last year) cannot be without them abruptly. Needs refill asap. The small amount ordered at last OV will not be enough. wants MD to order prednisone. message to MD Follow-up by: Golden Circle RN,  May 22, 2007 4:58 PM      Pt was seen by another provider on 6/18 and received short steroid course specifically for a rash.  That is why he only had a short term script.  However, I do agree that he has been on longterm Pred 4 every other day for > yr for chronic asthma.  Will refill Prednisone 4 mg by mouth every other day for 2 month supply until follow up visit.  Maxwell Marion, MD

## 2010-11-02 NOTE — Progress Notes (Signed)
Summary: Referral status  Phone Note Call from Patient Call back at Home Phone [phone]   Summary of Call: Pt is checking status of referral to neurosurgeon. Initial call taken by: Haydee Salter,  January 25, 2008 4:24 PM  Follow-up for Phone Call        Pt informed that Referral has been sent over.  [facility] contacted and they HAVE recieved the referral form. Follow-up by: Jone Baseman CMA,  January 25, 2008 4:28 PM         Appended Document: Referral status Checking status, wants [place] to call him today.

## 2010-11-02 NOTE — Consult Note (Signed)
Summary: Dr. Leticia Clas, Dermatology  Dr. Leticia Clas, Dermatology   Imported By: Haydee Salter 11/26/2008 14:58:46  _____________________________________________________________________  External Attachment:    Type:   Image     Comment:   External Document

## 2010-11-02 NOTE — Assessment & Plan Note (Signed)
Summary: FU/KH   Vital Signs:  Patient Profile:   65 Years Old Male Height:     69.5 inches (176.53 cm) Temp:     97.8 degrees F Pulse rate:   73 / minute BP sitting:   135 / 77  (left arm)  Pt. in pain?   yes    Location:   generalized joint discomfort    Intensity:   4    Type:       aching  Vitals Entered By: Theresia Lo RN (June 17, 2008 2:39 PM)              Is Patient Diabetic? No     PCP:  Judeth Cornfield Eugen Jeansonne  MD   History of Present Illness: [place]: doing well on medicines.  needs refills  Neck pain: turns out was carpal tunnel.  is being scheduled for release surgery on R wrist.  stable.  has been using bracing with some relief but starting to get weakness in the hand.  Asthma: off prednisone.  doing well.  using albuterol and advair.  taking albuterol as preventitive as well.  Osteopenia: taking boniva and calcium and vit D.  no pains at this point. off prednisone currently.  hard stools - was on colace for some time.  has been off for over a month and still doing well without.  hematuria: noticed blood in stool for 1.5 days and resolved.  never had an pain.  had been hiking when it occurred.  has occurred in the past and he states he was told it was nothing to worry about.    medications and past medical history reviewed with patient and no changes required except as noted.       Current Allergies: ! SULFA     Review of Systems      See HPI   Physical Exam  General:     Well-developed,well-nourished,in no acute distress; alert,appropriate and cooperative throughout examination Head:     Normocephalic and atraumatic without obvious abnormalities. No apparent alopecia or balding. Lungs:     Normal respiratory effort, chest expands symmetrically. occasional expiratory wheeze and rhonchi R>L.   Heart:     Normal rate and regular rhythm. S1 and S2 normal without gallop, murmur, click, rub or other extra sounds. Extremities:     no edema    Impression & Recommendations:  Problem # 1:  HEMATURIA, HX OF (ICD-V13.09) Assessment: New since this has resolved per patient and had no pain will check kidney function to make sure okay.  will also check psa.    Orders: [facility]- Est  Level 4 (81191)  Future Orders: Basic Met-[facility] ([id number]) ... 06/03/2009   Problem # 2:  BENIGN PROSTATIC HYPERTROPHY, HX OF (ICD-V13.8) Assessment: Unchanged check PSA at fasting lab visit.  Orders: [facility]- Est  Level 4 (08657)  Future Orders: PSA (Medicare)-[facility] ([id number]) ... 06/03/2009   Problem # 3:  Preventive Health Care (ICD-V70.0) Assessment: Unchanged given prescription for zoster vax will plan on cholesterol screen, psa screen in near future.  will also check fasting glucose given history of steroid use.  Problem # 4:  CARPAL TUNNEL SYNDROME, RIGHT (ICD-354.0) Assessment: Unchanged is working with neurosurgeon to get release date set for definitive treatment. Orders: [facility]- Est  Level 4 (29528)   Problem # 5:  OSTEOPENIA (ICD-733.90) Assessment: Unchanged off prednisone.  continue calcium, boniva, vit D.  check VitD levels.  His updated medication list for this problem includes:    Sm Calcium/vitamin D 500-200  Mg-unit Tabs (Calcium carbonate-vitamin d) .Marland Kitchen... Take 1 tablet by mouth twice a day    Boniva 150 Mg Tabs (Ibandronate sodium) .Marland Kitchen... 1 tab by mouth once monthly  Orders: [facility]- Est  Level 4 (62130)  Future Orders: Vit D, 25 [place]-[facility] ([id number]) ... 06/03/2009   Problem # 6:  RHINITIS, ALLERGIC (ICD-477.9) doing well currently.  needs refills on meds.  His updated medication list for this problem includes:    Fluticasone Propionate 50 Mcg/act Susp (Fluticasone propionate) ..... Spray 1-2 spray into both nostrils twice a day.  last refill before visit with primary md    Zyrtec Hives Relief 10 Mg Tabs (Cetirizine hcl) .Marland Kitchen... Take 1 tablet by mouth once a day  Orders: [facility]- Est  Level 4 (95284)   Problem # 7:  ASTHMA,  UNSPECIFIED (ICD-493.90) Assessment: Improved off prednisone.  needs refills of other meds.  doing well currently.  The following medications were removed from the medication list:    Prednisone 5 Mg Tabs (Prednisone) .Marland Kitchen... Take 1 tab by mouth every other day  His updated medication list for this problem includes:    Advair Diskus 500-50 Mcg/dose Misc (Fluticasone-salmeterol) ..... Inhale 1 puff as directed twice a day    Proair Hfa 108 (90 Base) Mcg/act Aers (Albuterol sulfate) .Marland Kitchen... 2 puffs 4 times daily as needed for shortness of breath.  last refill before visit with primary md  Orders: [facility]- Est  Level 4 (13244)   Complete Medication List: 1)  Advair Diskus 500-50 Mcg/dose Misc (Fluticasone-salmeterol) .... Inhale 1 puff as directed twice a day 2)  Fluticasone Propionate 50 Mcg/act Susp (Fluticasone propionate) .... Spray 1-2 spray into both nostrils twice a day.  last refill before visit with primary md 3)  Sm Calcium/vitamin D 500-200 Mg-unit Tabs (Calcium carbonate-vitamin d) .... Take 1 tablet by mouth twice a day 4)  Zyrtec Hives Relief 10 Mg Tabs (Cetirizine hcl) .... Take 1 tablet by mouth once a day 5)  Breatherite Coll Spacer Adult Misc (Spacer/aero-holding chambers) .... Use with inhaler at all times.  this helps to get more medicine in your lungs 6)  Boniva 150 Mg Tabs (Ibandronate sodium) .Marland Kitchen.. 1 tab by mouth once monthly 7)  Proair Hfa 108 (90 Base) Mcg/act Aers (Albuterol sulfate) .... 2 puffs 4 times daily as needed for shortness of breath.  last refill before visit with primary md  Other Orders: Future Orders: Lipid-[facility] ([id number]) ... 06/03/2009   Patient Instructions: 1)  Please follow up in about 6 months so we can make sure your breathing and everything is still going okay. 2)  Please make an appointment with the lab one morning to get fasting labs drawn.  We will plan on checking your cholesterol, prostate, kidney function, blood sugar and vitamin D level at  that time. 3)  Call here in early October to check to see if we have our flu shots yet and set up a nurse visit to get your flu shot. 4)  I will [facility] plenty of [facility] [address]. 5)  It was a pleasure to meet you today.   Prescriptions: PROAIR HFA 108 (90 BASE) MCG/ACT  AERS (ALBUTEROL SULFATE) 2 puffs 4 times daily as needed for shortness of breath.  last refill before visit with primary MD  #9 x 0   Entered and Authorized by:   Ancil Boozer  MD   Signed by:   Ancil Boozer  MD on 06/17/2008   Method used:  [facility]ronically to        [facility]  [address] [phone]* (retail)       [address]       [place], [place]  [address]       Ph: [phone] or [phone]       Fax: [phone]   RxID:   [id number] BONIVA 150 MG  TABS (IBANDRONATE SODIUM) 1 tab by mouth once monthly  #1 x 6   Entered and Authorized by:   Ancil Boozer  MD   Signed by:   Ancil Boozer  MD on 06/17/2008   Method used:   [facility]ically to        [facility]  [address] [phone]* (retail)       [address]       [place], [place]  [address]       Ph: [phone] or [phone]       Fax: [phone]   RxID:   [id number] FLUTICASONE PROPIONATE 50 MCG/ACT SUSP (FLUTICASONE PROPIONATE) Spray 1-2 spray into both nostrils twice a day.  last refill before visit with primary MD  #8 x 0   Entered and Authorized by:   Ancil Boozer  MD   Signed by:   Ancil Boozer  MD on 06/17/2008   Method used:   [facility]ically [facility]        [facility]  [address] [phone]* (retail)       [address]       [place], [place]  [address]       Ph: [phone] or [phone]       Fax: [phone]   RxID:   [id number] ADVAIR DISKUS 500-50 MCG/DOSE MISC (FLUTICASONE-SALMETEROL) Inhale 1 puff as directed twice a day  #1 x 6   Entered and Authorized by:   Ancil Boozer  MD   Signed by:   Ancil Boozer  MD on 06/17/2008   Method used:   [facility]ically to        [facility]   [address] [phone]* (retail)       [address]       [place], [place]  [address]       Ph: [phone] or [phone]       Fax: [phone]   RxID:   [id number]  ]  Vital Signs:  Patient Profile:   65 Years Old Male Height:     69.5 inches (176.53 cm) Temp:     97.8 degrees F Pulse rate:   73 / minute BP sitting:   135 / 77    Location:   generalized joint discomfort    Intensity:   4    Type:       aching

## 2010-11-02 NOTE — Miscellaneous (Signed)
Summary: Colonoscopy report  Clinical Lists Changes  Observations: Added new observation of COLONOSCOPY: abnormal (05/08/2006 18:04)       Preventive Care Screening  Colonoscopy:    Date:  05/08/2006    Results:  abnormal     Internal hemorrhoids and pandiverticulitis / 3 benign polyps removed no dysplasia identified. Seen by Dr. Loreta Ave.

## 2010-11-02 NOTE — Progress Notes (Signed)
Summary: Con't of prev phone note  Phone Note Outgoing Call   Call placed by: [facility] CMA,  January 31, 2008 4:41 PM Call placed to: Patient Summary of Call: I called pt to inform of MRI appt. ( see order) After MRI is performed and results recieved will fax to [facility] (Dr.Stein @ [phone])  to set up appt.  Pt informed and agreeable. Initial call taken by: [facility] CMA,  January 31, 2008 4:42 PM  Follow-up for Phone Call        Pt brought MRIs to Clinic.  Contacted Teineshia of [facility] she sts pt can bring MRI's to office now.  Pt informed and agreeable.  Dr.Reed-Crawford informed Follow-up by: Jone Baseman CMA,  Feb 01, 2008 11:41 AM

## 2010-11-02 NOTE — Assessment & Plan Note (Signed)
Summary: infection worse?/[place]/everhart   Vital Signs:  Patient profile:   65 year old male Height:      69.5 inches Weight:      144.4 pounds Temp:     98.2 degrees F oral Pulse rate:   65 / minute BP sitting:   133 / 77  (left arm) Cuff size:   regular  Vitals Entered By: Gladstone Pih (March 15, 2010 10:14 AM) CC: C/O out of Antibotic, psorisis better but not where pt wants it to be Is Patient Diabetic? No Pain Assessment Patient in pain? no        Primary Care Provider:  Ancil Boozer  MD  CC:  C/O out of Antibotic and psorisis better but not where pt wants it to be.  History of Present Illness: 1.  rash--psoriasis for a long time.  seen by derm 2/10 and prescribed dovanex and clobetasol.  got some better after starting those, but  never completely resolved.  getting worse past 3 months.  very itchy.  scratching a lot.  never went back to derm.   Following up from visit last week when givien prednisone and doxycycline.  pt states that the redness has gotten better bu it is still inflamed and is still nervous that skin is infected.  Pt denies fever, chills, nausea, vomiting, diarrhea or constipation.  Pt does have appointment scheduled for Aug 6th to see dermatolgy.  Pt thinks it may be necessary to be treated again with doxycycline due to him having this skin lesion.  Pt has never been hospitalized with skin infection.  Pt has not seen any pus or drainage from lesion and most of the lesions are non tender to touch and not warm.    Habits & Providers  Alcohol-Tobacco-Diet     Tobacco Status: quit     Tobacco Counseling: to quit use of tobacco products     Year Quit: 1985  Current Medications (verified): 1)  Proair Hfa 108 (90 Base) Mcg/act  Aers (Albuterol Sulfate) .... 2 Puffs 4 Times Daily As Needed For Shortness of Breath. 2)  Advair Diskus 500-50 Mcg/dose Misc (Fluticasone-Salmeterol) .... Inhale 1 Puff As Directed Twice A Day 3)  Fluticasone Propionate 50 Mcg/act Susp  (Fluticasone Propionate) .... Spray 1-2 Spray Into Both Nostrils Twice A Day. 4)  Zyrtec Hives Relief 10 Mg Tabs (Cetirizine Hcl) .... Take 1 Tablet By Mouth Once A Day 5)  Breatherite Coll Spacer Adult   Misc (Spacer/aero-Holding Chambers) .... Use With Inhaler At All Times.  This Helps To Get More Medicine in Your Lungs 6)  Sm Calcium/vitamin D 500-200 Mg-Unit Tabs (Calcium Carbonate-Vitamin D) .... Take 1 Tablet By Mouth Twice A Day 7)  Clobetasol Propionate 0.05 % Crea (Clobetasol Propionate) .... Apply To Affected Area Two Times A Day.  Disp 60g 8)  Dovonex 0.005 % Soln (Calcipotriene) .... Apply To Affected Are Two Times A Day. Disp 60g 9)  Flomax 0.4 Mg Caps (Tamsulosin Hcl) .Marland Kitchen.. 1 By Mouth Once Daily For Prostate 10)  Hydroxyzine Hcl 25 Mg Tabs (Hydroxyzine Hcl) .... Take 1 Tab By Mouth Three Times A Day  Allergies (verified): 1)  ! Sulfa  Past History:  Past medical, surgical, family and social histories (including risk factors) reviewed, and no changes noted (except as noted below).  Past Medical History: Reviewed history from 11/30/2006 and no changes required. 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  Social History: Reviewed history from 11/30/2006 and no changes required. divorced, 3 children.  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  Hx. Of alcohol abuse and tobacco abuse but quit 1985.; ; Hx of Prednisone Tx for Asthma  10mg  QOD X 10 yrs.; Ophtho scheduled 6/07 (?glaucoma 2/2 chronic pred use); ; [place],Mann--GI; Young--Pulmonary; Pool-Neurosurg.Smoking Status:  quit  Review of Systems       denies fever, chills, nausea, vomiting, diarrhea or constipation   Physical Exam  General:  Well-developed,well-nourished,in no acute distress; alert,appropriate and cooperative throughout examination Pt has alot of paint on her clothes Eyes:  PERRLA Mouth:  MMM Lungs:  Normal respiratory effort, chest expands symmetrically. Lungs are clear to auscultation, no crackles or wheezes. Heart:  Normal rate and regular rhythm. S1 and S2 normal without gallop, murmur, click, rub or other extra sounds. Abdomen:  NT, ND Pulses:  2+ in extrmeties  Extremities:  + 2 edema of lower extremities b/l Skin:  diffuse rash on feet, legs arms, back.  red, with some mild scale.  lots of excoriated lesions.  no pus none are colating together,  no warmth over them , non tender to palpation  Inguinal Nodes:  no lad   Impression & Recommendations:  Problem # 1:  PSORIASIS (ICD-696.1) Assessment Improved Pt was given another prescription to have on hand for pt to fill if any of the red flags pt was given occur, pt also is going to wear sunscreen if start taking them.  Pt was not given another rx for prednisone, will montior, if worsen pt should return earlier otherwise see again in 2 weeks. Continue creams given rx for hydroxizine to take three times a day to help with itch. Pt was reassured that nothing looked infected at this time.  Orders: [facility]- Est Level  3 (99213)  Complete Medication List: 1)  Proair Hfa 108 (90 Base) Mcg/act Aers (Albuterol sulfate) .... 2 puffs 4 times daily as needed for shortness of breath. 2)  Advair Diskus 500-50 Mcg/dose Misc (Fluticasone-salmeterol) .... Inhale 1 puff as directed twice a  day 3)  Fluticasone Propionate 50 Mcg/act Susp (Fluticasone propionate) .... Spray 1-2 spray into both nostrils twice a day. 4)  Zyrtec Hives Relief 10 Mg Tabs (Cetirizine hcl) .... Take 1 tablet by mouth once a day 5)  Breatherite Coll Spacer Adult Misc (Spacer/aero-holding chambers) .... Use with inhaler at all times.  this helps to get more medicine in your lungs 6)  Sm Calcium/vitamin D 500-200 Mg-unit Tabs (Calcium carbonate-vitamin d) .... Take 1 tablet by mouth twice a day 7)  Clobetasol Propionate 0.05 % Crea (Clobetasol propionate) .... Apply to affected area two times a day.  disp 60g 8)  Dovonex 0.005 % Soln (Calcipotriene) .... Apply to affected are two times a day. disp 60g 9)  Flomax 0.4 Mg Caps (Tamsulosin hcl) .Marland Kitchen.. 1 by mouth once daily for prostate 10)  Hydroxyzine Hcl 25 Mg Tabs (Hydroxyzine hcl) .... Take 1 tab by mouth three times a day  Patient  Instructions: 1)  Very nice to meet you 2)  I think you should continue to put the creams on to help with the inflammation.   3)  I will give you a prescription for doxycycline to have incase you start to become very nervous.  You should fill this and take it if skin becomes more red, warn, tender to palpation and hurts with movement. If take it use sunscreen when outside! 4)  I am also giving you another antihistamine to try to help with the itch.  Hydroxyzine take 1 tab  three times a day to help.  5)  I want to follow up with your doctor in 2 weeks.   6)    Prescriptions: HYDROXYZINE HCL 25 MG TABS (HYDROXYZINE HCL) take 1 tab by mouth three times a day  #90 x 3   Entered and Authorized by:   Antoine Primas DO   Signed by:   Antoine Primas DO on 03/15/2010   Method used:   [facility] to        [facility] [address] [phone]* (retail)       [address]       [place], [place]  [address]       Ph: [phone] or [phone]       Fax: [phone]   RxID:   [id number]

## 2010-11-02 NOTE — Progress Notes (Signed)
Summary: Mult. issues  Phone Note Call from Patient Call back at Home Phone [phone]   Summary of Call: is needing to speak with someone about his rx's, states the refills on them are not correct, also states he has another question for the rn, but would only tell her when she calls back. Initial call taken by: Haydee Salter,  October 14, 2008 2:38 PM  Follow-up for Phone Call        Pts refills were denied bc he needed an appt per Dr.Alm.  BUT MD told him to come back in 6 months @ the september appt so he is not quite due yet.  Also would like referral to Derm for his skin condition.  Pt sts " I trust Dr.Alm but this is not her specialty " Will forward to MD. Follow-up by: Jone Baseman CMA,  October 14, 2008 5:27 PM  Additional Follow-up for Phone Call Additional follow up Details #1::        refills have been made and I apologize for the error.  Please ask the patient to let us know his preference for dermatologist and we will make the referral though we will certainly be glad to take a look if he desires as well as we may be able to treat it at our office and save a copay Additional Follow-up by: Ancil Boozer  MD,  October 14, 2008 7:25 PM    Additional Follow-up for Phone Call Additional follow up Details #2::    LMOVM informing of med refill change and asked him to call back re: referral to Derm with more information Follow-up by: [facility] CMA,  October 15, 2008 11:58 AM  Additional Follow-up for Phone Call Additional follow up Details #3:: Details for Additional Follow-up Action Taken: LMOVM again     Pt returned call and informed of the above appt made...see order Additional Follow-up by: Lot Medford CMA,  October 16, 2008 11:00 AM  New/Updated Medications: FLUTICASONE PROPIONATE 50 MCG/ACT SUSP (FLUTICASONE PROPIONATE) Spray 1-2 spray into both nostrils twice a day. PROAIR HFA 108 (90 BASE) MCG/ACT  AERS (ALBUTEROL SULFATE) 2 puffs 4 times daily as  needed for shortness of breath.   Prescriptions: PROAIR HFA 108 (90 BASE) MCG/ACT  AERS (ALBUTEROL SULFATE) 2 puffs 4 times daily as needed for shortness of breath.  #4 x 1   Entered and Authorized by:   Ancil Boozer  MD   Signed by:   Ancil Boozer  MD on 10/14/2008   Method used:   [facility]        [facility] [address] [phone]* (retail)       [address]       [place], [place]  [address]       Ph: [phone] or [phone]       Fax: [phone]   RxID:   [id number] FLUTICASONE PROPIONATE 50 MCG/ACT SUSP (FLUTICASONE PROPIONATE) Spray 1-2 spray into both nostrils twice a day.  #4 x 1   Entered and Authorized by:   Ancil Boozer  MD   Signed by:   Ancil Boozer  MD on 10/14/2008   Method used:   [facility]        [facility] [address] [phone]* (retail)       [address]       [place], [place]  [address]       Ph: [phone] or [phone]       Fax: [phone]   RxID:  [id number]  

## 2010-11-02 NOTE — Assessment & Plan Note (Signed)
Summary: 6 mo f/u,df   Vital Signs:  Patient profile:   65 year old male Height:      69.5 inches Weight:      140 pounds BMI:     20.45 BSA:     1.79 Temp:     98.5 degrees F Pulse rate:   76 / minute BP sitting:   123 / 79  Vitals Entered By: Jone Baseman CMA (June 11, 2009 4:00 PM) CC: f/u asthma, vitamin D, BPH, psoriasis Is Patient Diabetic? No Pain Assessment Patient in pain? no        Primary Care Provider:  Ancil Boozer  MD  CC:  f/u asthma, vitamin D, BPH, and psoriasis.  History of Present Illness: asthma: doing well.  no longer on steroids.  taking meds as prescribed. needs refills.  rarely using albuterol.  vitamin D: s/p course.  isn't taking boniva.  is taking ca + D.  due for check to see if repleted or not  BPH: previously on avodart - stopped because caused sexual dysfunction.  curently waking up to urinate in night and sometimes cannot urinate.  psa checked 11/09 and was WNL.  psoriasis: saw Dr Margo Aye.  prescribed 2 creams which have helped.  would like refills.   Habits & Providers  Alcohol-Tobacco-Diet     Tobacco Status: never  Current Medications (verified): 1)  Proair Hfa 108 (90 Base) Mcg/act  Aers (Albuterol Sulfate) .... 2 Puffs 4 Times Daily As Needed For Shortness of Breath. 2)  Advair Diskus 500-50 Mcg/dose Misc (Fluticasone-Salmeterol) .... Inhale 1 Puff As Directed Twice A Day 3)  Fluticasone Propionate 50 Mcg/act Susp (Fluticasone Propionate) .... Spray 1-2 Spray Into Both Nostrils Twice A Day. 4)  Zyrtec Hives Relief 10 Mg Tabs (Cetirizine Hcl) .... Take 1 Tablet By Mouth Once A Day 5)  Breatherite Coll Spacer Adult   Misc (Spacer/aero-Holding Chambers) .... Use With Inhaler At All Times.  This Helps To Get More Medicine in Your Lungs 6)  Sm Calcium/vitamin D 500-200 Mg-Unit Tabs (Calcium Carbonate-Vitamin D) .... Take 1 Tablet By Mouth Twice A Day 7)  Clobetasol Propionate 0.05 % Crea (Clobetasol Propionate) .... Apply To  Affected Area Two Times A Day.  Disp 60g 8)  Dovonex 0.005 % Soln (Calcipotriene) .... Apply To Affected Are Two Times A Day. Disp 60g 9)  Flomax 0.4 Mg Caps (Tamsulosin Hcl) .Marland Kitchen.. 1 By Mouth Once Daily For Prostate  Allergies (verified): 1)  ! Sulfa  Past History:  Past medical, surgical, family and social histories (including risk factors) reviewed for relevance to current acute and chronic problems.  Past Medical History: Reviewed history from 11/30/2006 and no changes required. 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  Social History: Reviewed history from 11/30/2006 and no changes required. divorced, 3 children.  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  Hx. Of alcohol abuse and tobacco abuse but quit 1985.; ; Hx of  Prednisone Tx for Asthma  10mg  QOD X 10 yrs.; Ophtho scheduled 6/07 (?glaucoma 2/2 chronic pred use); ; [place],Mann--GI; Young--Pulmonary; Pool-Neurosurg.Smoking Status:  never  Review of Systems       per HPI  Physical Exam  General:  Well-developed,well-nourished,in no acute distress; alert,appropriate and cooperative throughout examination Lungs:  Normal respiratory effort, chest expands symmetrically. Lungs are clear to auscultation, no crackles or wheezes. Heart:  Normal rate and regular  rhythm. S1 and S2 normal without gallop, murmur, click, rub or other extra sounds. Skin:  Intact without suspicious lesions or rashes   Impression & Recommendations:  Problem # 1:  ASTHMA, UNSPECIFIED (ICD-493.90) Assessment Unchanged  refilled meds.  stable. f/u 1 yr or as needed.   His updated medication list for this problem includes:    Proair Hfa 108 (90 Base) Mcg/act Aers (Albuterol sulfate) .Marland Kitchen... 2 puffs 4 times daily as needed for shortness of breath.    Advair Diskus 500-50 Mcg/dose Misc (Fluticasone-salmeterol) ..... Inhale 1 puff as directed twice a day  Orders: [facility]- Est  Level 4 ([address]846)  Problem # 2:  PSORIASIS (ICD-696.1) Assessment: Unchanged refilled meds.  stable  Orders: [facility]- Est  Level 4 (96295)  Problem # 3:  VITAMIN D DEFICIENCY (ICD-268.9) Assessment: Unchanged f/u to see if repleted.   Orders: Vit D, 25 [facility] ([address]-24401) [facility]- Est  Level 4 (02725)  Problem # 4:  BENIGN PROSTATIC HYPERTROPHY, HX OF (ICD-V13.8) Assessment: Deteriorated start flomax. f/u symptoms Orders: [facility]- Est  Level 4 ([address]214)  Complete Medication List: 1)  Proair Hfa 108 (90 Base) Mcg/act Aers (Albuterol sulfate) .... 2 puffs 4 times daily as needed for shortness of breath. 2)  Advair Diskus 500-50 Mcg/dose Misc (Fluticasone-salmeterol) .... Inhale 1 puff as directed twice a day 3)  Fluticasone Propionate 50 Mcg/act Susp (Fluticasone propionate) .... Spray 1-2 spray into both nostrils twice a day. 4)  Zyrtec Hives Relief 10 Mg Tabs (Cetirizine hcl) .... Take 1 tablet by mouth once a day 5)  Breatherite Coll Spacer Adult Misc (Spacer/aero-holding chambers) .... Use with inhaler at all times.  this helps to get more medicine in your lungs 6)  Sm Calcium/vitamin D 500-200 Mg-unit Tabs (Calcium carbonate-vitamin d) .... Take 1 tablet by mouth twice a day 7)  Clobetasol Propionate 0.05 % Crea (Clobetasol propionate) .... Apply to affected area two times a day.  disp 60g 8)  Dovonex  0.005 % Soln (Calcipotriene) .... Apply to affected are two times a day. disp 60g 9)  Flomax 0.4 Mg Caps (Tamsulosin hcl) .Marland Kitchen.. 1 by mouth once daily for prostate  Patient Instructions: 1)  Please follow up once a year.   2)  I will call you if your vitamin D level is still low.  Prescriptions: FLOMAX 0.4 MG CAPS (TAMSULOSIN HCL) 1 by mouth once daily for prostate  #32 x 11   Entered and Authorized by:   Ancil Boozer  MD   Signed by:   Ancil Boozer  MD on 06/11/2009   Method used:   [facility]        [facility] [address] [phone]* (retail)       [address]       [place], [place]  [address]       Ph: [phone] or [phone]       Fax: [phone]   RxID:   [id number] DOVONEX 0.005 % SOLN (CALCIPOTRIENE) apply to affected are two times a day. Disp 60g  #1 x 4  Entered and Authorized by:   Ancil Boozer  MD   Signed by:   Ancil Boozer  MD on 06/11/2009   Method used:   [facility]ronically to        [facility] [address] [phone]* (retail)       [address]       [place], [place]  [address]       Ph: [phone] or [phone]       Fax: [phone]   RxID:   [id number] CLOBETASOL PROPIONATE 0.05 % CREA (CLOBETASOL PROPIONATE) apply to affected area two times a day.  Disp 60g  #1 x 4   Entered and Authorized by:   Ancil Boozer  MD   Signed by:   Ancil Boozer  MD on 06/11/2009   Method used:   [facility]ically [facility]        [facility] [address] [phone]* (retail)       [address]       [place], [place]  [address]       Ph: [phone] or [phone]       Fax: [phone]   RxID:   [id number] FLUTICASONE PROPIONATE 50 MCG/ACT SUSP (FLUTICASONE PROPIONATE) Spray 1-2 spray into both nostrils twice a day.  #1 x 11   Entered and Authorized by:   Ancil Boozer  MD   Signed by:   Ancil Boozer  MD on 06/11/2009   Method used:   [facility]ronically [facility]        [facility] [address] [phone]* (retail)       [address]       [place], [place]   [address]       Ph: [phone] or [phone]       Fax: [phone]   RxID:   [id number] ADVAIR DISKUS 500-50 MCG/DOSE MISC (FLUTICASONE-SALMETEROL) Inhale 1 puff as directed twice a day  #1 x 11   Entered and Authorized by:   Ancil Boozer  MD   Signed by:   Ancil Boozer  MD on 06/11/2009   Method used:   [facility]ronically to        [facility] [address] [phone]* (retail)       [address]       [place], [place]  [address]       Ph: [phone] or [phone]       Fax: [phone]   RxID:   [id number] PROAIR HFA 108 (90 BASE) MCG/ACT  AERS (ALBUTEROL SULFATE) 2 puffs 4 times daily as needed for shortness of breath.  #1 x 11   Entered and Authorized by:   Ancil Boozer  MD   Signed by:   Ancil Boozer  MD on 06/11/2009   Method used:   [facility]        [facility] [address] [phone]* (retail)       [address]       [place], [place]  [address]       Ph: [phone] or [phone]       Fax: [phone]   RxID:   [id number]   Prevention & Chronic Care Immunizations   Influenza vaccine: Fluvax 3+  (07/04/2007)   Influenza vaccine due: 07/03/2008    Tetanus booster: 03/09/2006: Done.   Tetanus booster due: 03/09/2016    Pneumococcal vaccine: Not documented    H. zoster vaccine: Not documented  Colorectal Screening   Hemoccult: Not documented   Hemoccult due: Not Indicated  Colonoscopy: abnormal  (05/08/2006)   Colonoscopy due: 05/08/2016  Other Screening   PSA: 1.33  (08/18/2008)   Smoking status: never  (06/11/2009)  Lipids   Total Cholesterol: 156  (08/18/2008)   LDL: 96  (08/18/2008)   LDL Direct: 106  (03/13/2007)   HDL: 47  (08/18/2008)   Triglycerides: 63  (08/18/2008)

## 2010-11-02 NOTE — Assessment & Plan Note (Signed)
Summary: f/u visit [facility]   Vital Signs:  Patient Profile:   65 Years Old Male Height:     69 inches (175.26 cm) Weight:      151 pounds Temp:     99.9 degrees F Pulse rate:   93 / minute BP sitting:   119 / 80  Pt. in pain?   no  Vitals Entered By: Jone Baseman CMA (July 03, 2007 1:58 PM)                  Chief Complaint:  f/u asthma.  History of Present Illness: Presents for asthma follow up on chronic pred 20 mg by mouth every other day for atleast 1-2 years, previously followed by Dr. Ilda Mori in pulm clinic here.  Attempting slow pred wean since last visit 9/2.  Was taking Pred 5 mg 3.5 tabs by mouth every other day x 2 weeks and not Pred 5 mg 3 tabs by mouth every other day.    Asthma- States feels allergies are flaring some with decrease in pred.  Breathing has been ok.  No acture flares.  States feels more itchy with weather change.   States using alb with MDI < 2days weekly.  Admits to nighttime cough < 2 month.          Physical Exam  General:     Well-developed,well-nourished,in no acute distress; alert,appropriate and cooperative throughout examination Eyes:     No corneal or conjunctival inflammation noted. EOMI. Perrla. Funduscopic exam benign, without hemorrhages, exudates or papilledema. Vision grossly normal. Ears:     External ear exam shows no significant lesions or deformities.  Otoscopic examination reveals clear canals, tympanic membranes are intact bilaterally without bulging, retraction, inflammation or discharge. Hearing is grossly normal bilaterally. Nose:     External nasal examination shows no deformity or inflammation. Nasal mucosa are pink and moist without lesions or exudates. Lungs:     Normal respiratory effort, chest expands symmetrically. Lungs are clear to auscultation, no crackles or wheezes. Heart:     Normal rate and regular rhythm. S1 and S2 normal without gallop, murmur, click, rub or other extra sounds.    Impression &  Recommendations:  Problem # 1:  ASTHMA, UNSPECIFIED (ICD-493.90) Assessment: Unchanged Cont slow pred wean see detailed pt instructions.  Mild/ Mod well controlled at current regimen.    Last PFT FEV1 95 % predicted but FEV1/ FVC ratio 54% consistent with obstructive pattern as well.  May benefit repeat PFT in future once meds titrated.    His updated medication list for this problem includes:    Advair Diskus 500-50 Mcg/dose Misc (Fluticasone-salmeterol) ..... Inhale 1 puff as directed twice a day    Albuterol 90 Mcg/act Aers (Albuterol) ..... Inhale 2 puff using inhaler four times a day    Singulair 10 Mg Tabs (Montelukast sodium) .Marland Kitchen... Take 1 tablet by mouth once a day    Prednisone 5 Mg Tabs (Prednisone) .Marland Kitchen... Take 3  tabs by mouth every other day x 2 weeks  then decrease to 2.5  tabs by mouth every other day for 2 weeks.    Orders: [facility]- Est  Level 4 (04540)   Problem # 2:  OSTEOPENIA (ICD-733.90) Assessment: Unchanged Osteoporosis T score -2.6 hip and -1.8 T- spine sec to chronic pred.  Cont Fosamax.  Attempt to wean pred as above.    His updated medication list for this problem includes:    Fosamax 70 Mg Tabs (Alendronate sodium) .Marland Kitchen... Take 1 tablet by  mouth once a week    Sm Calcium/vitamin D 500-200 Mg-unit Tabs (Calcium carbonate-vitamin d) .Marland Kitchen... Take 1 tablet by mouth twice a day  Orders: [facility]- Est  Level 4 (62130)  Orders: [facility]- Est  Level 4 (86578)  Orders: [facility]- Est  Level 4 (46962)   Problem # 3:  Preventive Health Care (ICD-V70.0) Had colonoscopy per pt but we do not have these records.  Asked pt to contact their office and have results faxed to [place].  FLU shot given today.    Complete Medication List: 1)  Advair Diskus 500-50 Mcg/dose Misc (Fluticasone-salmeterol) .... Inhale 1 puff as directed twice a day 2)  Albuterol 90 Mcg/act Aers (Albuterol) .... Inhale 2 puff using inhaler four times a day 3)  Fluticasone Propionate 50 Mcg/act Susp (Fluticasone propionate)  .... Spray 1 spray into both nostrils twice a day 4)  Fosamax 70 Mg Tabs (Alendronate sodium) .... Take 1 tablet by mouth once a week 5)  Singulair 10 Mg Tabs (Montelukast sodium) .... Take 1 tablet by mouth once a day 6)  Sm Calcium/vitamin D 500-200 Mg-unit Tabs (Calcium carbonate-vitamin d) .... Take 1 tablet by mouth twice a day 7)  Zyrtec Hives Relief 10 Mg Tabs (Cetirizine hcl) .... Take 1 tablet by mouth once a day 8)  Prednisone 5 Mg Tabs (Prednisone) .... Take 3  tabs by mouth every other day x 2 weeks  then decrease to 2.5  tabs by mouth every other day for 2 weeks.   Patient Instructions: 1)  Follow up with primary physician ( [facility]) in 4 weeks for asthma. 2)  Stay at Prednisone 5 mg 3 tabs by mouth every other day for 2 more weeks.   3)  Then may attempt to decrease to Pred 5 mg 2.5 tabs every other day. 4)  Return sooner for other concerns. 5)  Request for copy of colonoscopy to be faxed to [place] @ [phone].    ]

## 2010-11-02 NOTE — Miscellaneous (Signed)
Summary: preventive care update  Clinical Lists Changes  Observations: Added new observation of HEMOCULTDUE: Not Indicated (06/17/2008 8:55) Added new observation of FLEXSIGDUE: Not Indicated (06/17/2008 8:55)     Flex Sig Next Due:  Not Indicated Hemoccult Next Due:  Not Indicated

## 2010-12-08 ENCOUNTER — Ambulatory Visit: Payer: Self-pay | Admitting: Family Medicine

## 2010-12-13 ENCOUNTER — Ambulatory Visit (INDEPENDENT_AMBULATORY_CARE_PROVIDER_SITE_OTHER): Payer: Self-pay | Admitting: Family Medicine

## 2010-12-13 ENCOUNTER — Encounter: Payer: Self-pay | Admitting: Family Medicine

## 2010-12-13 VITALS — BP 112/78 | HR 72 | Temp 98.4°F | Wt 142.8 lb

## 2010-12-13 DIAGNOSIS — K4090 Unilateral inguinal hernia, without obstruction or gangrene, not specified as recurrent: Secondary | ICD-10-CM | POA: Insufficient documentation

## 2010-12-13 DIAGNOSIS — J45909 Unspecified asthma, uncomplicated: Secondary | ICD-10-CM

## 2010-12-13 DIAGNOSIS — L408 Other psoriasis: Secondary | ICD-10-CM

## 2010-12-13 DIAGNOSIS — J309 Allergic rhinitis, unspecified: Secondary | ICD-10-CM

## 2010-12-13 HISTORY — DX: Unilateral inguinal hernia, without obstruction or gangrene, not specified as recurrent: K40.90

## 2010-12-13 LAB — HEPATIC FUNCTION PANEL
ALT: 17 U/L (ref 10–40)
AST: 18 U/L (ref 14–40)
Alkaline Phosphatase: 87 U/L (ref 25–125)
Bilirubin, Direct: 0.1 mg/dL
Bilirubin, Total: 0.7 mg/dL

## 2010-12-13 MED ORDER — FLUTICASONE PROPIONATE 50 MCG/ACT NA SUSP: 1.0000 | Freq: Every day | NASAL | Status: DC

## 2010-12-13 MED ORDER — FLUTICASONE-SALMETEROL 500-50 MCG/DOSE IN AEPB: 1.0000 | INHALATION_SPRAY | Freq: Two times a day (BID) | RESPIRATORY_TRACT | Status: DC

## 2010-12-13 MED ORDER — ALBUTEROL SULFATE HFA 108 (90 BASE) MCG/ACT IN AERS: 2.0000 | INHALATION_SPRAY | Freq: Four times a day (QID) | RESPIRATORY_TRACT | Status: DC | PRN

## 2010-12-13 MED ORDER — MOMETASONE FURO-FORMOTEROL FUM 200-5 MCG/ACT IN AERO: 2.0000 | INHALATION_SPRAY | Freq: Two times a day (BID) | RESPIRATORY_TRACT | Status: DC

## 2010-12-13 NOTE — Progress Notes (Signed)
  Subjective:    Patient ID: Brian Leon, male    DOB: 09-26-1946, 65 y.o.   MRN: [id number]  HPI Asthma-on advair and albuterol, using albuterol QID for any symptoms of chest tightness but only using aedvair intermittently Zyrtec did not help him much so stopped taking, still taking flonase  Hernia-  Noticed bulge on lower right side 3 weeks ago.  Has some aching.  No changes in BMs, not very painfull.  Feels that it is fuller when he has been standing for some time.   Review of Systems    denies, CP, palpitations, HA, changes in vision Objective:   Physical Exam    Vital signs reviewed General appearance - alert, well appearing, and in no distress and oriented to person, place, and time Chest - clear to auscultation, no wheezes, rales or rhonchi, symmetric air entry, no tachypnea, retractions or cyanosis Heart - normal rate, regular rhythm, normal S1, S2, no murmurs, rubs, clicks or gallops Abdomen - soft, nontender, nondistended, no masses or organomegaly---hernia exam did not reveal a clear bulge, no bulge was elicited with cough.  Pt examined standing with RN present     Assessment & Plan:

## 2010-12-13 NOTE — Assessment & Plan Note (Signed)
Deteriorated- reviewed how pt takes meds and discovered that he is not aware that advair is a daily med- worked with Scientific laboratory technician to educate pt.  Will have pt RTC in one month after using advair daily to see if improved.  Pt has tried singulair with no benefit.  Next step would be to switch to dulera--> then to add spiriva if still necessary.

## 2010-12-13 NOTE — Assessment & Plan Note (Addendum)
New.  Unable to elicit today however hx consistent with hernia.  Pt desires surgery for this.  Will refer to [facility] for evaluation and treatment

## 2010-12-13 NOTE — Patient Instructions (Signed)
It was nice to meet you today Please come back in 1 month to see how you are doing Please take the advair every day without fail, twice a day That should decrease the chest tightness and your need for albuterol We can see how you are doing when you return I have sent in your referral to the surgeon, they will call you with an appt

## 2010-12-15 ENCOUNTER — Encounter: Payer: Self-pay | Admitting: Family Medicine

## 2010-12-20 ENCOUNTER — Ambulatory Visit: Payer: Self-pay | Admitting: Family Medicine

## 2010-12-22 ENCOUNTER — Encounter (HOSPITAL_BASED_OUTPATIENT_CLINIC_OR_DEPARTMENT_OTHER)
Admission: RE | Admit: 2010-12-22 | Discharge: 2010-12-22 | Disposition: A | Discharge status: Home / Self Care | Payer: Medicare Other | Source: Ambulatory Visit | Attending: General Surgery | Admitting: General Surgery

## 2010-12-22 ENCOUNTER — Other Ambulatory Visit: Payer: Self-pay | Admitting: Psychology

## 2010-12-22 ENCOUNTER — Ambulatory Visit
Admission: RE | Admit: 2010-12-22 | Discharge: 2010-12-22 | Disposition: A | Discharge status: Home / Self Care | Payer: Medicare Other | Source: Ambulatory Visit | Attending: Psychology | Admitting: Psychology

## 2010-12-22 DIAGNOSIS — Z01811 Encounter for preprocedural respiratory examination: Secondary | ICD-10-CM

## 2010-12-22 LAB — CBC
HCT: 40.9 % (ref 39.0–52.0)
Hemoglobin: 13.7 g/dL (ref 13.0–17.0)
MCH: 32 pg (ref 26.0–34.0)
MCHC: 33.5 g/dL (ref 30.0–36.0)
MCV: 95.6 fL (ref 78.0–100.0)
Platelets: 361 10*3/uL (ref 150–400)
RBC: 4.28 MIL/uL (ref 4.22–5.81)
RDW: 12.6 % (ref 11.5–15.5)
WBC: 10.1 10*3/uL (ref 4.0–10.5)

## 2010-12-22 LAB — BASIC METABOLIC PANEL
BUN: 9 mg/dL (ref 6–23)
CO2: 24 mEq/L (ref 19–32)
Calcium: 8.8 mg/dL (ref 8.4–10.5)
Chloride: 107 mEq/L (ref 96–112)
Creatinine, Ser: 0.72 mg/dL (ref 0.4–1.5)
GFR calc Af Amer: >60 mL/min (ref >60)
GFR calc non Af Amer: >60 mL/min (ref >60)
Glucose, Bld: 92 mg/dL (ref 70–99)
Potassium: 3.9 mEq/L (ref 3.5–5.1)
Sodium: 136 mEq/L (ref 135–145)

## 2010-12-22 LAB — DIFFERENTIAL
Basophils Absolute: 0.1 10*3/uL (ref 0.0–0.1)
Basophils Relative: 1 % (ref 0–1)
Eosinophils Absolute: 2 10*3/uL — ABNORMAL HIGH (ref 0.0–0.7)
Eosinophils Relative: 20 % — ABNORMAL HIGH (ref 0–5)
Lymphocytes Relative: 14 % (ref 12–46)
Lymphs Abs: 1.4 10*3/uL (ref 0.7–4.0)
Monocytes Absolute: 0.9 10*3/uL (ref 0.1–1.0)
Monocytes Relative: 9 % (ref 3–12)
Neutro Abs: 5.7 10*3/uL (ref 1.7–7.7)
Neutrophils Relative %: 57 % (ref 43–77)

## 2010-12-24 ENCOUNTER — Ambulatory Visit (HOSPITAL_BASED_OUTPATIENT_CLINIC_OR_DEPARTMENT_OTHER)
Admission: RE | Admit: 2010-12-24 | Discharge: 2010-12-24 | Disposition: A | Discharge status: Home / Self Care | Payer: Medicare Other | Source: Ambulatory Visit | Attending: General Surgery | Admitting: General Surgery

## 2010-12-24 DIAGNOSIS — Z01810 Encounter for preprocedural cardiovascular examination: Secondary | ICD-10-CM | POA: Insufficient documentation

## 2010-12-24 DIAGNOSIS — Z01812 Encounter for preprocedural laboratory examination: Secondary | ICD-10-CM | POA: Insufficient documentation

## 2010-12-24 DIAGNOSIS — J45909 Unspecified asthma, uncomplicated: Secondary | ICD-10-CM | POA: Insufficient documentation

## 2010-12-24 DIAGNOSIS — K4090 Unilateral inguinal hernia, without obstruction or gangrene, not specified as recurrent: Secondary | ICD-10-CM | POA: Insufficient documentation

## 2010-12-24 HISTORY — PX: INGUINAL HERNIA REPAIR: SUR1180

## 2011-01-05 NOTE — Op Note (Signed)
NAMESTEPAN, Brian Leon              ACCOUNT NO.:  [id number]  MEDICAL RECORD NO.:  [id number]            PATIENT TYPE:  LOCATION:                                 FACILITY:  PHYSICIAN:  Cherylynn Ridges, M.D.         DATE OF BIRTH:  DATE OF PROCEDURE:  12/24/2010 DATE OF DISCHARGE:                              OPERATIVE REPORT   PREOPERATIVE DIAGNOSIS:  Indirect right inguinal hernia.  POSTOPERATIVE DIAGNOSIS:  Indirect right inguinal hernia.  PROCEDURE:  Drexel Iha type of right inguinal hernia repair.  No mesh.  SURGEON:  Cherylynn Ridges, MD  ANESTHESIA:  General with a laryngeal airway.  ESTIMATED BLOOD LOSS:  Less than 20 mL.  COMPLICATIONS:  No complications.  CONDITION:  Stable.  FINDINGS:  Small indirect right inguinal hernia.  INDICATIONS FOR OPERATION:  The patient is a 65 year old gentleman with a non-right inguinal hernia who comes in now for repair.  OPERATION:  The patient was taken to the operating room, placed on table in supine position.  After an adequate general laryngeal airway anesthetic was administered, he was prepped and draped in usual sterile manner exposing his right inguinal area.  After a proper time-out was performed identifying the patient and the procedure to be performed, we went ahead and made a right transverse curvilinear incision about 5 cm long down to the subcutaneous tissue.  A subcutaneous vein was ligated with 3-0 Vicryl.  We went down to the external oblique fascia, which was then split along its fibers down through the superficial ring.  We mobilized the spermatic cord using a Penrose drain then subsequently placed it up on a work bench with the Penrose drain underneath.  We dissected out the indirect sac on the anterior medial aspect of the cord, isolated down to its base, twisted in and then subsequently excised the excess sac after suture ligating it at the base with 2 suture ligatures of 0-Ethibond suture.  Once that was done, we  placed some plicating stitches in the floor.  No mesh was used.  0-Ethibond suture was used to bring the conjoined tendon down to the reflected portion of the inguinal ligament.  These were interrupted stitches.  A total of four stitches were used.  A relaxing incision was made on the internal oblique muscle up along the confluence with the external oblique.  The ilioinguinal nerve was noted during the process, was kept out of the field and was not injured or damaged or entrapped in the process.  After we performed a relaxing incision, we placed the cord back in the canal reapproximating the external oblique fascia using running 3-0 Vicryl suture.  We then reapproximated the Scarpa fascia using interrupted 3-0 Vicryl and the skin was closed using running subcuticular stitch of 4-0 Monocryl.  All needle counts, sponge counts, and instrument counts were correct.  Dermabond, Steri-Strips, and Tegaderm used to complete the dressing.     Cherylynn Ridges, M.D.     JOW/MEDQ  D:  12/24/2010  T:  12/25/2010  Job:  [id number]  Electronically Signed by Jimmye Norman M.D. on 01/05/2011 05:25:13 PM

## 2011-01-21 ENCOUNTER — Encounter: Payer: Self-pay | Admitting: Family Medicine

## 2011-01-21 ENCOUNTER — Ambulatory Visit (INDEPENDENT_AMBULATORY_CARE_PROVIDER_SITE_OTHER): Payer: Medicare Other | Admitting: Family Medicine

## 2011-01-21 DIAGNOSIS — J309 Allergic rhinitis, unspecified: Secondary | ICD-10-CM

## 2011-01-21 DIAGNOSIS — K4090 Unilateral inguinal hernia, without obstruction or gangrene, not specified as recurrent: Secondary | ICD-10-CM

## 2011-01-21 DIAGNOSIS — J45909 Unspecified asthma, uncomplicated: Secondary | ICD-10-CM

## 2011-01-21 NOTE — Assessment & Plan Note (Signed)
Post op from simple repair.  Some pain but no complications.  To follow with surgeon.

## 2011-01-21 NOTE — Assessment & Plan Note (Signed)
He thinks this is mostly the reason he uses the albuterol.  He is trying to treat the "rattle" he feels with allergies.  He has singulair at home he is willing to try again.  He will try singulair for 2 weeks and if no improvement try zyrtec for 2 weeks.  Will continue zyrtec if helpful.

## 2011-01-21 NOTE — Progress Notes (Signed)
  Subjective:    Patient ID: Brian Leon, male    DOB: 27-Mar-1946, 65 y.o.   MRN: [id number]  HPI  Asthma- now taking adviar BID and feels much improved.  Previous used albuterol 4x/day, now using BID.  Used to have night time symptoms, now has no night time symptoms.  Uses it for coughing spells, which are triggered by seasonal allergies  Seasonal allergies- on flonase.  No itchy eye symptoms or nasal drainage.  Hernia- had simple repair, no mesh.  On cellcept for dermatitis per derm.  Has f/u with surgeon in 2 weeks and derm in one month.  Pain is improved but still local tenderness.      Review of Systems No CP, HA.  Some SOB with coughing    Objective:   Physical Exam Vital signs reviewed General appearance - alert, well appearing, and in no distress and oriented to person, place, and time Chest - clear to auscultation, no wheezes, rales or rhonchi, symmetric air entry, no tachypnea, retractions or cyanosis Heart - normal rate, regular rhythm, normal S1, S2, no murmurs, rubs, clicks or gallops Abdomen - soft, nontender except over surgical incision without evidence of infection or poor healing, nondistended, no masses or organomegaly   skin-  Irritation and excoriation on bilateral lower extremities below knee    Assessment & Plan:

## 2011-01-21 NOTE — Patient Instructions (Signed)
It was nice to see you today Please keep taking the advair twice a day every day Please try the singulair for 2 weeks, if no improvement, you can stop it If you stop the singulair, start the zyrtec 10mg  once a day and see if you get some improvement Please come back in 3 months if you have steady improvement or sooner if you get worse

## 2011-01-21 NOTE — Assessment & Plan Note (Signed)
Using advair with much improvement.  Still using albuterol for symptoms of allergies.  Discussed this at length.  He will try allergy meds and see if he can decrease his albuterol use.  Could switch to dulera if not improved.

## 2011-02-08 ENCOUNTER — Encounter (INDEPENDENT_AMBULATORY_CARE_PROVIDER_SITE_OTHER): Payer: Self-pay | Admitting: General Surgery

## 2011-02-15 NOTE — Op Note (Signed)
NAMEBARBARA, Brian Leon                ACCOUNT NO.:  [id number]   MEDICAL RECORD NO.:  [id number]          PATIENT TYPE:  AMB   LOCATION:  SDS                          FACILITY:  [facility]   PHYSICIAN:  Danae Orleans. Venetia Maxon, M.D.  DATE OF BIRTH:  [birth year]   DATE OF PROCEDURE:  08/21/2008  DATE OF DISCHARGE:  08/21/2008                               OPERATIVE REPORT   PREOPERATIVE DIAGNOSIS:  Left carpal tunnel syndrome.   POSTOPERATIVE DIAGNOSIS:  Left carpal tunnel syndrome.   PROCEDURE:  Left carpal tunnel release.   SURGEON:  Danae Orleans. Venetia Maxon, MD   ANESTHETIC:  Monitored sedation with local lidocaine.   COMPLICATIONS:  None.   DISPOSITION:  Recovery.   ESTIMATED BLOOD LOSS:  Minimal.   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  He now comes in  for left carpal tunnel release.   PROCEDURE:  [name] was brought to the operating room.  Following  satisfactory and uncomplicated establishment of intravenous sedation,  left hand and forearm were prepped and draped in the usual sterile  fashion with Betadine scrub and paint and sterile stockinette.  Incision  was marked in line with the first to fourth ray from the distal palmar  crease about 3 cm into the palm.  It was infiltrated with local  lidocaine.  Incision was made with 15 blade, flexor retinaculum was  incised sharply, and the carpal tunnel was released.  This was done more  proximally into the volar forearm and more distally into the palm and it  was felt that the carpal tunnel was well decompressed.  The incision was  then closed with interrupted 3-0 nylon vertical mattress stitches.  Sterile occlusive dressing was placed along with hand wrap with Kerlix  and Kling.  The patient was taken to recovery and tolerated the  procedure well.      Danae Orleans. Venetia Maxon, M.D.  Electronically Signed     JDS/MEDQ  D:  08/21/2008  T:  08/22/2008  Job:  [id number]

## 2011-02-15 NOTE — Op Note (Signed)
Brian Leon, KARNES                ACCOUNT NO.:  [id number]   MEDICAL RECORD NO.:  [id number]          PATIENT TYPE:  OIB   LOCATION:  3537                         FACILITY:  [facility]   PHYSICIAN:  Danae Orleans. Venetia Maxon, M.D.  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   PROCEDURES:  1. Right ulnar nerve decompression.  2. Right carpal tunnel release.   SURGEON:  Danae Orleans. Venetia Maxon, MD   ANESTHESIA:  General with LMA and local with lidocaine.   COMPLICATIONS:  None.   DISPOSITION:  Recovery.   INDICATIONS:  Woodard Perrell is a 65 year old man with right carpal tunnel  syndrome and right ulnar neuropathy demonstrated on EMG nerve conduction  velocities.  He has abductor digiti quinti weakness.  He was elected to  perform a right ulnar nerve decompression and right carpal tunnel  release.   PROCEDURE:  Mr. Dupuy is brought to the operating room.  Following  satisfactory and uncomplicated induction of laryngeal mask anesthesia,  his right hand, forearm, elbow, and upper arm were prepped with Betadine  scrub and paint.  A sterile stockinette was placed.  The right upper  extremity was placed on the extremity table.  The right medial elbow was  marked with a curvilinear incision just overlying the medial epicondyle  and the right carpal tunnel was marked in line with the fourth ray from  the distal wrist crease into the volar palm over a distance about 3 cm.  Initially, the ulnar nerve decompression was performed.  Using a 10  blade, the skin was opened.  Subcutaneous tissues were carefully  dissected.  The nerve was identified just proximal to the olecranon  fossa, and the exposure of the nerve was carried  between the 2 heads of  the flexor carpi ulnaris.  There appeared to be the septum, more  proximally was opened and the nerve was felt to be free at this point  and the course of the nerve was palpated as it went between the heads of  flexor carpi ulnaris and there appeared to be decompressed well at this  point as well.  The ulnar nerve was decompressed of investing fascia  overlying the cubital fossa.  The nerve did not appear to sublux.  It  was not felt to be necessary to dig to transpose the nerve, and there  did not appear to be any residual bony compression of the nerve.  The  wound was then irrigated and closed with 2-0 Vicryl sutures and 3-0  Vicryl subcuticular stitch.  Later, a Benzoin and Steri-Strips, Telfa  gauze, fluff, and Kerlix wrap dressings were placed.  Attention was then  turned to the right carpal tunnel release.  Incision was made with 15  blade, carried through the flexor retinaculum.  The carpal tunnel was  decompressed as they  extended distally into the palm and also more  proximally to the volar forearm, and the carpal tunnel was felt to be  well decompressed.  Hemostasis was assured with bipolar cautery.  The  wound was closed with 3-0 vertical mattress nylon stitches in  interrupted fashion and  dressed with bacitracin, Telfa gauze, fluff, and Kerlix wrap.  The  patient was extubated in the operating room and taken to the recovery in  stable and satisfactory condition having tolerating the operation well.  Counts were correct at the end of the case.      Danae Orleans. Venetia Maxon, M.D.  Electronically Signed     JDS/MEDQ  D:  07/03/2008  T:  07/03/2008  Job:  [id number]

## 2011-02-18 NOTE — Op Note (Signed)
   Brian Leon, Brian Leon                          ACCOUNT NO.:  [id number]   MEDICAL RECORD NO.:  [id number]                   PATIENT TYPE:  AMB   LOCATION:  DSC                                  FACILITY:  [facility]   PHYSICIAN:  Rodney A. Chaney Malling, M.D.           DATE OF BIRTH:  12-15-1945   DATE OF PROCEDURE:  DATE OF DISCHARGE:                                 OPERATIVE REPORT   NO DICTATION FOR THIS REPORT.                                               Rodney A. Chaney Malling, M.D.    RAM/MEDQ  D:  02/27/2003  T:  02/27/2003  Job:  [id number]

## 2011-02-18 NOTE — Procedures (Signed)
[facility]  Patient:    Brian Leon, Brian Leon                       MRN: [id number] Proc. Date: 06/09/00 Adm. Date:  [id number] Attending:  Thyra Breed CC:         Julio Sicks, M.D.   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  PROCEDURE:  Cervical epidural steroid injection.  SURGEON:  Thyra Breed, M.D.  INDICATIONS:  The patient is a very pleasant 65 year old gentleman who is sent to [place] by Dr. Jordan Likes for a cervical epidural steroid injection.  The patient stated that he was in his usual state of health up until about six months ago. He had had previous surgery on his neck two to three years ago, and had done well up until six months ago, when he developed the gradual onset of some numbness and tingling in his left upper extremity involving predominantly the hands today.  He describes more fourth and fifth digit involvement, but in general, has been more first, second, and third.  Associated with this, he has had some muscular pains in his left upper extremity.  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  He has been taking Vioxx which was initially helping, but the dose has been increased to 50 mg per day.  He describes stated as mild weakness in his left upper extremity with some achiness is the primary character of his pain.  He denied any bowel or bladder incontinence.  CURRENT MEDICATIONS:  Vioxx,  Albuterol, and prednisone, theophylline, and AeroBid.  ALLERGIES:  ________.  PAST SURGICAL HISTORY:  Significant for lumbar surgery by Dr. Jordan Likes several years ago and neck surgery two to three years ago.  He has also had his right eye enucleated.  He has had arthroscopy of his right knee and a diaphragmatic hernia repair.  SOCIAL HISTORY:  The patient quit smoking 15 years ago, as well as quit drinking.  He has a history of alcoholism.  He works as a Education administrator.  FAMILY HISTORY:  Positive for alcoholism and cirrhosis.  ACTIVE MEDICAL PROBLEMS:  Include asthma which is steroid dependent, renal calculi, diverticulitis, and prostatic enlargement.  REVIEW OF SYSTEMS:  General negative.  Head - see HPI.  Eyes - see past surgical history.  Nose, mouth, and throat with recurrent sinusitis.  Ears negative.  Pulmonary - see past surgical history.  Cardiovascular negative. GI - see active medical problems.  GU - see active medical problems. Musculoskeletal and neurologic - see HPI.  Hematologic negative.  Cutaneous negative.  Endocrine negative.  Psychiatric - see past medical history. Allergy and immunologic positive for pollen allergies and asthma.  PHYSICAL EXAMINATION:  Blood pressure is 133/73, heart rate 61, respiratory rate 10, O2 saturations 98%.  Pain level is 3 out of 10.  In general, this is a pleasant male in no acute distress.  He has no apparent hair thinning.  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  Left demonstrates intact extraocular movements with conjunctivae and sclerae clear.  Nose pink and nares clear. The oropharynx is free of lesions.  Neck demonstrates crepitus on range of motion, but fairly good range of motion overall.  He had negative Spurlings sign.  Carotids were 2+ and symmetric without bruits.  Lungs were clear. Heart was regular rate and rhythm.  Abdomen, genitalia, and rectal exams were not performed.  Straight leg raise signs are negative and gait is  intact. Extremities with no cyanosis, clubbing, or edema with evidence of previous surgical interventions with well-healed incisional scars.  Radial pulse and dorsalis pedis pulses are 2+ and symmetric with no edema.  Neurologic - the patient was oriented x 4.  Cranial nerves 2-12 were grossly intact.  Deep tendon reflexes were symmetric in the upper and lower extremities with downgoing toes.  There was no clonus.  Motor was intact except for intrinsic muscles of the left hand, especially with pincher grasped of the thumb to the little finger on the left side.  Sensory was significant for attenuated pinprick over the lateral aspect of the left hand dorsum.  Vibratory sense intact and coordination intact.  IMPRESSION: 1. Neck pain with radiation to the left upper extremity consistent with a    probable underlying cervical radiculopathy on the basis of cervical    spondylosis, status post fusion at C5-6 with no evidence of neuroforaminal    stenosis at that level. 2. Other medical problems including asthma, diverticulitis, history of    alcoholism, prostate enlargement, and kidney stones.  DISPOSITION:  I discussed the potential risks, benefits, and limitations of a cervical epidural steroid injection in detail with the patient as well as side effects of corticosteroids.  He is interested in proceeding with this.  DESCRIPTION OF PROCEDURE:  After informed consent was obtained, the patient was placed in the left lateral decubitus position and monitored.  His neck was prepped with Betadine x 3.  The skin level was raised at the C7-T1 interspace with 1% lidocaine.  A 20-gauge Tuohy needle was introduced to the cervical epidural space to a loss of resistance to preservative-free normal saline. There is no blood nor CSF.  Forty milligrams of Medrol of 4 ml of preservative-free normal saline was gently injected.  The needle was flushed with preservative-free normal saline and removed  intact.  POSTPROCEDURE CONDITION:  Stable.  DISCHARGE INSTRUCTIONS: 1. Resume previous diet.  2. Limitation and activities per instruction sheet. 3. Continue on current medications. 4. The patient plans to follow up with Dr. Jordan Likes.  I advised him that I could    see him back in six months to consider repeating epidural steroid    injection, but in general, we do not do a series of three as the literature    states that a series of three offers no advantage over one, and is probably    not indicated to do a series of three based on these findings. DD:  06/09/00 TD:  06/11/00 Job: [id number]

## 2011-02-18 NOTE — Op Note (Signed)
NAMEJAYDIN, Brian Leon                          ACCOUNT NO.:  [id number]   MEDICAL RECORD NO.:  [id number]                   PATIENT TYPE:  AMB   LOCATION:  DSC                                  FACILITY:  [facility]   PHYSICIAN:  Rodney A. Chaney Malling, M.D.           DATE OF BIRTH:  Jun 04, 1946   DATE OF PROCEDURE:  02/27/2003  DATE OF DISCHARGE:                                 OPERATIVE REPORT   PREOPERATIVE DIAGNOSIS:  Massive tear, rotator cuff, right shoulder with  retraction.   POSTOPERATIVE DIAGNOSIS:  Massive tear, rotator cuff, right shoulder with  retraction.   OPERATION:  Diagnostic arthroscopy right shoulder, open acromioplasty right  shoulder, and repair of massive rotator cuff tear using three Mitek anchors.   SURGEON:  Lenard Galloway. Chaney Malling, M.D.   ANESTHESIA:  General.   PROCEDURE:  The patient was placed on the operating table in a semi-sitting  position.  The right shoulder and upper extremity were prepped with DuraPrep  and draped out in the usual manner.  The scope was introduced into the  posterior portal and a careful examination of the shoulder was undertaken.  There was some irregularity of the humeral head and there was also some  grade 2 cartilage changes about the anterior portion of the glenoid.  There  was a partial tear of the anterior labral rim but not complete detachment.  Superiorly, there was marked synovitis and fraying of the rotator cuff and  there was a massive tear.  From the articular side of the joint, one could  look and see the subacromial area.  The rotator cuff was totally retracted.  The arthroscope was then placed in the subacromial space and, again, the  humeral head was totally exposed with massive retraction of the rotator  cuff.  The arthroscope was then removed.   A saber cut incision was made over the anterolateral aspect of the right  shoulder.  Skin edges were retracted.  Bleeders were coagulated.  The  deltoid fibers were released  off the anterior aspect of the acromion only.  A Neer acromioplasty was done, this gave excellent access to the shoulder  joint.  The supraspinatus cuff could be seen to be totally avulsed from the  humeral head and retracted proximally.  Fortunately, the rotator cuff could  be mobilized and brought down to its anatomic position.  The area of  attachment to the humeral head was then debrided with a rongeur to give [place]  bleeding bone.  The cuff was retracted distally with the arm abducted.  Three Mitek staples were inserted to the appropriate level and sutured  through the cuff until the cuff was pulled down into an almost anatomic  position.  An excellent repair was achieved with excellent stability.  A  great deal of time was spent doing this procedure but this was a fairly  complicated procedure.  Throughout the procedure, the shoulder was irrigated  with copious amounts of antibiotic solution.  There was good range of motion  of the shoulder.  Once this was accomplished, the deltoid was reattached  with 0 Vicryl, 2-0 Vicryl was used to close the subcutaneous tissue, and the  skin was closed with stainless steel staples.  A sterile dressing was  applied.  Again, this was a very complicated surgical procedure and took  extra time and precision.                                               Rodney A. Chaney Malling, M.D.    RAM/MEDQ  D:  02/27/2003  T:  02/27/2003  Job:  [id number]

## 2011-04-27 ENCOUNTER — Other Ambulatory Visit: Payer: Self-pay | Admitting: Family Medicine

## 2011-04-28 NOTE — Telephone Encounter (Signed)
Refill request

## 2011-05-09 ENCOUNTER — Other Ambulatory Visit: Payer: Self-pay | Admitting: Family Medicine

## 2011-05-09 NOTE — Telephone Encounter (Signed)
Refill request

## 2011-07-04 LAB — COMPREHENSIVE METABOLIC PANEL
ALT: 15
AST: 17
Albumin: 3.6
Alkaline Phosphatase: 85
BUN: 9
CO2: 26
Calcium: 8.7
Chloride: 109
Creatinine, Ser: 0.73
GFR calc Af Amer: >60
GFR calc non Af Amer: >60
Glucose, Bld: 92
Potassium: 4.1
Sodium: 139
Total Bilirubin: 0.8
Total Protein: 5.8 — ABNORMAL LOW

## 2011-07-04 LAB — CBC
HCT: 38.8 — ABNORMAL LOW
Hemoglobin: 13.2
MCHC: 34
MCV: 97.3
Platelets: 326
RBC: 3.99 — ABNORMAL LOW
RDW: 12.3
WBC: 8.7

## 2011-07-04 LAB — BILIRUBIN, DIRECT: Bilirubin, Direct: 0.1

## 2011-07-05 LAB — BASIC METABOLIC PANEL
BUN: 11
CO2: 27
Calcium: 9.1
Chloride: 109
Creatinine, Ser: 0.66
GFR calc Af Amer: >60
GFR calc non Af Amer: >60
Glucose, Bld: 91
Potassium: 4.3
Sodium: 142

## 2011-07-05 LAB — HEPATIC FUNCTION PANEL
ALT: 16
AST: 15
Albumin: 3.9
Alkaline Phosphatase: 86
Bilirubin, Direct: 0.2
Indirect Bilirubin: 0.9
Total Bilirubin: 1.1
Total Protein: 6.5

## 2011-07-05 LAB — CBC
HCT: 38.2 — ABNORMAL LOW
Hemoglobin: 12.9 — ABNORMAL LOW
MCHC: 33.8
MCV: 97.5
Platelets: 340
RBC: 3.91 — ABNORMAL LOW
RDW: 12.9
WBC: 9.9

## 2011-07-22 ENCOUNTER — Ambulatory Visit (INDEPENDENT_AMBULATORY_CARE_PROVIDER_SITE_OTHER): Payer: Medicare Other | Admitting: Family Medicine

## 2011-07-22 DIAGNOSIS — J45909 Unspecified asthma, uncomplicated: Secondary | ICD-10-CM

## 2011-07-22 MED ORDER — ALBUTEROL SULFATE HFA 108 (90 BASE) MCG/ACT IN AERS: 1.0000 | INHALATION_SPRAY | RESPIRATORY_TRACT | Status: DC | PRN

## 2011-07-22 MED ORDER — FLUTICASONE-SALMETEROL 500-50 MCG/DOSE IN AEPB: 1.0000 | INHALATION_SPRAY | Freq: Two times a day (BID) | RESPIRATORY_TRACT | Status: DC

## 2011-07-22 NOTE — Progress Notes (Signed)
  Subjective:    Patient ID: Brian Leon, male    DOB: [birth year], 65 y.o.   MRN: [id number]  HPI Patient is here for evaluation of his asthma. Patient is known to have secondary medical problems including psoriasis and osteopenia. Psoriasis has been managed at [facility].  Patient was seen previously seen in April this year by Dr. Hulen Luster with noted asthma exacerbation. Patient was taking 4 times a day albuterol the time as well as intermittent use of his Advair.   Today patient states that he's been religious in his Advair use,using this twice daily  and his overall respiratory symptoms are much improved. Patient does report however, that he's had a recent viral URI that he was using albuterol to 3 times an hour for respiratory relief. Today patient states that his respiratory symptoms are much improved. Cough and increased work of work of breathing is all but resolved.  Review of Systems See HPI     Objective:   Physical Exam Gen: up in chair, NAD HEENT: NCAT, EOMI, TMs clear bilaterally CV: RRR, no murmurs auscultated PULM: good overall air mov't, faint wheezes in apices, mild cough with deep inspiration ABD: S/NT/+ bowel sounds  EXT: 2+ peripheral pulses   SKIN: noted psoriatic plaques and AKs diffusely on anterior forearms  Assessment & Plan:

## 2011-07-22 NOTE — Assessment & Plan Note (Addendum)
Improved with twice a day Advair. Patient still in recovery phase for viral URI. Peak flow 290/550 of predicted. Would like to reassess respiratory status and to 4 weeks once viral URI has completely resolved. I suspect patient may benefit from a long-acting anticholinergic. Would also like to set up patient for outpatient PFTs. Will followup in 2 weeks.

## 2011-07-22 NOTE — Patient Instructions (Signed)

## 2011-08-05 ENCOUNTER — Encounter: Payer: Self-pay | Admitting: Family Medicine

## 2011-08-05 ENCOUNTER — Ambulatory Visit (INDEPENDENT_AMBULATORY_CARE_PROVIDER_SITE_OTHER): Payer: Medicare Other | Admitting: Family Medicine

## 2011-08-05 DIAGNOSIS — Z23 Encounter for immunization: Secondary | ICD-10-CM

## 2011-08-05 DIAGNOSIS — J45909 Unspecified asthma, uncomplicated: Secondary | ICD-10-CM

## 2011-08-05 MED ORDER — FLUTICASONE-SALMETEROL 500-50 MCG/DOSE IN AEPB: 1.0000 | INHALATION_SPRAY | Freq: Two times a day (BID) | RESPIRATORY_TRACT | Status: DC

## 2011-08-05 NOTE — Patient Instructions (Signed)
It was good to see you today I am glad that you are doing better Continue on your current respiratory regimen I would like for you to have formal lung studies here. Make an appt for this as your leave today.  Come back to see me in 2 months,  Call with any questions, God Bless, Doree Albee MD

## 2011-08-05 NOTE — Progress Notes (Signed)
  Subjective:    Patient ID: Brian Leon, male    DOB: 1946/04/17, 65 y.o.   MRN: [id number]  HPI Pt is here for general follow up visit on URI. Pt with baseline hx/op severe Asthma/COPD as well as severe psoriasis.  From a medication standpoint. Pt is on Advair 500/50 for lung disease. Pt is on methotrexate and cyclosporine for psoriatic disease. Pt also has an appt with [facility] in the next 2 weeks per pt.   Pt states that respiratory status is markedly improved. Prior bronchitis is all but resolved.  No cough or increased WOB. No fevers. For his respiratory disease, pt states that he was on oral prednisone for this in the past. Last PFTs were 2 years ago which showed mod-severe obstructive disease with marked albuterol induced reversibility. Pt states that he has never had a formal pulmonology referral.   Review of Systems See HPI     Objective:   Physical Exam Gen: up in chair, NAD HEENT: NCAT, EOMI, TMs clear bilaterally CV: RRR, no murmurs auscultated PULM: good overall air movement, no wheezes, rales, rhoncii SKIN: psoriatic plaques and SKs diffusely Assessment & Plan:

## 2011-08-06 NOTE — Assessment & Plan Note (Addendum)
Bronchitis clinically resolved. Will continue pt on current advair regimen. WIll also set up pt for formal PFTs .Pt with noted mod-sever obstructive lung disease in 2009. May consider formal referral to pulmonology in setting of concominant use immunosuppresant meds and severe psoriasis.

## 2011-08-31 DIAGNOSIS — L2081 Atopic neurodermatitis: Secondary | ICD-10-CM | POA: Insufficient documentation

## 2011-09-06 ENCOUNTER — Other Ambulatory Visit: Payer: Self-pay | Admitting: Family Medicine

## 2011-09-06 NOTE — Telephone Encounter (Signed)
Refill request

## 2011-10-31 DIAGNOSIS — L2089 Other atopic dermatitis: Secondary | ICD-10-CM | POA: Diagnosis not present

## 2011-10-31 DIAGNOSIS — Z79899 Other long term (current) drug therapy: Secondary | ICD-10-CM | POA: Diagnosis not present

## 2011-11-30 DIAGNOSIS — Z79899 Other long term (current) drug therapy: Secondary | ICD-10-CM | POA: Diagnosis not present

## 2011-11-30 DIAGNOSIS — L2089 Other atopic dermatitis: Secondary | ICD-10-CM | POA: Diagnosis not present

## 2011-12-05 ENCOUNTER — Other Ambulatory Visit: Payer: Self-pay | Admitting: Family Medicine

## 2011-12-05 NOTE — Telephone Encounter (Signed)
Refill request

## 2012-01-25 DIAGNOSIS — L570 Actinic keratosis: Secondary | ICD-10-CM | POA: Diagnosis not present

## 2012-01-25 DIAGNOSIS — L2089 Other atopic dermatitis: Secondary | ICD-10-CM | POA: Diagnosis not present

## 2012-01-25 DIAGNOSIS — Z79899 Other long term (current) drug therapy: Secondary | ICD-10-CM | POA: Diagnosis not present

## 2012-02-09 ENCOUNTER — Ambulatory Visit (INDEPENDENT_AMBULATORY_CARE_PROVIDER_SITE_OTHER): Payer: Medicare Other | Admitting: Family Medicine

## 2012-02-09 ENCOUNTER — Ambulatory Visit (HOSPITAL_COMMUNITY)
Admission: RE | Admit: 2012-02-09 | Discharge: 2012-02-09 | Disposition: A | Discharge status: Home / Self Care | Payer: Medicare Other | Source: Ambulatory Visit | Attending: Family Medicine | Admitting: Family Medicine

## 2012-02-09 ENCOUNTER — Encounter: Payer: Self-pay | Admitting: Family Medicine

## 2012-02-09 DIAGNOSIS — R509 Fever, unspecified: Secondary | ICD-10-CM | POA: Diagnosis not present

## 2012-02-09 DIAGNOSIS — R05 Cough: Secondary | ICD-10-CM | POA: Diagnosis not present

## 2012-02-09 DIAGNOSIS — R059 Cough, unspecified: Secondary | ICD-10-CM | POA: Diagnosis not present

## 2012-02-09 DIAGNOSIS — J45909 Unspecified asthma, uncomplicated: Secondary | ICD-10-CM | POA: Diagnosis not present

## 2012-02-09 DIAGNOSIS — J449 Chronic obstructive pulmonary disease, unspecified: Secondary | ICD-10-CM | POA: Diagnosis not present

## 2012-02-09 DIAGNOSIS — J309 Allergic rhinitis, unspecified: Secondary | ICD-10-CM

## 2012-02-09 DIAGNOSIS — R319 Hematuria, unspecified: Secondary | ICD-10-CM | POA: Insufficient documentation

## 2012-02-09 DIAGNOSIS — J4489 Other specified chronic obstructive pulmonary disease: Secondary | ICD-10-CM | POA: Insufficient documentation

## 2012-02-09 LAB — POCT URINALYSIS DIPSTICK
Bilirubin, UA: NEGATIVE
Glucose, UA: NEGATIVE
Ketones, UA: 15
Leukocytes, UA: NEGATIVE
Nitrite, UA: NEGATIVE
Protein, UA: 30
Spec Grav, UA: 1.02
Urobilinogen, UA: 0.2
pH, UA: 7

## 2012-02-09 LAB — POCT UA - MICROSCOPIC ONLY: Epithelial cells, urine per micros: <5

## 2012-02-09 MED ORDER — FLUTICASONE-SALMETEROL 500-50 MCG/DOSE IN AEPB: 1.0000 | INHALATION_SPRAY | Freq: Two times a day (BID) | RESPIRATORY_TRACT | Status: DC

## 2012-02-09 MED ORDER — FLUTICASONE PROPIONATE 50 MCG/ACT NA SUSP: 1.0000 | Freq: Every day | NASAL | Status: DC

## 2012-02-09 NOTE — Assessment & Plan Note (Signed)
Refilled advair.

## 2012-02-09 NOTE — Assessment & Plan Note (Addendum)
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 He has not taken methotrexate in 10 days and has not taken cyclosporine since illness began. Plan to not take utnil >24 hours without fever.   Patient is very well appearing and states overall illness is getting better. Believe this is likely a viral illness. Given immunocompromised state, will obtain CXR (given cough), UA, and C Diff sample. CXR has already been completed and only shows COPD/asthma changes without focal consolidation. URinalysis with 1+ bacteria but no leukocytes/nitrities. Ordered urine culture but sample emptied before obtained. Patient left C Diff sample.  These results continue to point towards viral illness. Patient to follow up if not continuing to improve by Monday or if worsens.   Precepted case with Dr. Deirdre Priest. Updated patient by phone about CXR not showing PNA and urine not showing infection.

## 2012-02-09 NOTE — Patient Instructions (Signed)
Dear Lorretta Harp,   It was great to see you today. Thank you for coming to clinic. I am sorry you aren't feeling well. Please read below regarding the issues that we discussed.   1. We think you likely have a viral illness as you suspected. Since you are on medications that weaken your immune system, we are going to be more cautious and do the following: chest x-ray, urine sample, and poop sample. I will call if any of these are abnormal. 2. I would suspect you should continue to feel better if this is a virus. If you do not feel better by Monday or start to feel "debiltated" again, I want you to come see [place] or go to an urgent care or emergency room.   Please follow up in clinic in as needed . Please call earlier if you have any questions or concerns.   Sincerely,  Dr. Tana Conch

## 2012-02-09 NOTE — Assessment & Plan Note (Signed)
Patient with a history of hematuria noted in problem list. Incidentally noted on UA today. Will forward to PCP for advisement as I am unfamiliar with patient history in this area.

## 2012-02-09 NOTE — Assessment & Plan Note (Signed)
Refilled flonase  

## 2012-02-09 NOTE — Progress Notes (Signed)
  Subjective:    Patient ID: Brian Leon, male    DOB: 11-29-1945, 66 y.o.   MRN: [id number]  HPI  Starting Sunday-fever up to 102 and chills which patient describes as constant through Tuesday. He felt miserable during this time and "debilitated". Cough and some chest tightness more than usual. Some green phlegm during this time. No body aches. Diarrhea with 7-8 small bowel movements per day. No blood. Started Fri-Saturday. No antibiotics within last month. Slight/minimal stomach discomfort. No blood in stool.   Woke up Tuesday-no fever for first time. Later in day got a fever again to 101. Overall felt better. Today, still having fevers but not debilitating. Says he feels much better today overall.   ROS-no nausea, vomiting, slightly decreased PO intake, no recent antibiotic use (4 months ago), no skin breakdown or rash, no new medications or increases in medications. Denies dysuria/polyuria.    Review of Systems -See HPI  Past Medical History-smoking status noted: former smoker 27 years ago. Patient with a type of dermatitis managed by [facility]. On immunosuppressant medications.   Reviewed problem list.  Medications- reviewed and updated Chief complaint-noted      Objective:   Physical Exam  Constitutional: He is oriented to person, place, and time. No distress.       Well appearing gentleman. Thin, elderly and appears slightly older than stated age.   HENT:  Head: Normocephalic and atraumatic.  Right Ear: Tympanic membrane normal.  Left Ear: Tympanic membrane normal.  Nose: Nose normal.  Mouth/Throat: Oropharynx is clear and moist. No oropharyngeal exudate.  Eyes: Conjunctivae and EOM are normal. Pupils are equal, round, and reactive to light.       Eye exam for left eye. 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   Neck: Normal range of motion. Neck supple.  Cardiovascular: Normal rate and regular rhythm.  Exam reveals no gallop and no friction rub.   No murmur heard. Pulmonary/Chest:  Effort normal and breath sounds normal. He has no wheezes. He has no rales.  Abdominal: Soft. Bowel sounds are normal.  Musculoskeletal: Normal range of motion. He exhibits no edema.  Neurological: He is alert and oriented to person, place, and time.  Skin: Skin is warm and dry. No rash noted. He is not diaphoretic.       No areas with erythema/induration/infected appearance. Examined from knees down and chest up. Several SKs noted in various areas. Some plaques noted as well which patient states are unchanged.        Assessment & Plan:

## 2012-02-10 LAB — CLOSTRIDIUM DIFFICILE EIA: CDIFTX: NEGATIVE

## 2012-02-14 DIAGNOSIS — Z79899 Other long term (current) drug therapy: Secondary | ICD-10-CM | POA: Diagnosis not present

## 2012-03-28 DIAGNOSIS — Z79899 Other long term (current) drug therapy: Secondary | ICD-10-CM | POA: Diagnosis not present

## 2012-03-28 DIAGNOSIS — D485 Neoplasm of uncertain behavior of skin: Secondary | ICD-10-CM | POA: Diagnosis not present

## 2012-03-28 DIAGNOSIS — B079 Viral wart, unspecified: Secondary | ICD-10-CM | POA: Diagnosis not present

## 2012-03-28 DIAGNOSIS — L2089 Other atopic dermatitis: Secondary | ICD-10-CM | POA: Diagnosis not present

## 2012-06-28 DIAGNOSIS — Z85828 Personal history of other malignant neoplasm of skin: Secondary | ICD-10-CM | POA: Insufficient documentation

## 2012-06-28 DIAGNOSIS — L255 Unspecified contact dermatitis due to plants, except food: Secondary | ICD-10-CM | POA: Diagnosis not present

## 2012-06-28 DIAGNOSIS — L570 Actinic keratosis: Secondary | ICD-10-CM | POA: Insufficient documentation

## 2012-06-28 DIAGNOSIS — L259 Unspecified contact dermatitis, unspecified cause: Secondary | ICD-10-CM | POA: Diagnosis not present

## 2012-06-28 DIAGNOSIS — C44319 Basal cell carcinoma of skin of other parts of face: Secondary | ICD-10-CM | POA: Diagnosis not present

## 2012-06-28 DIAGNOSIS — D485 Neoplasm of uncertain behavior of skin: Secondary | ICD-10-CM | POA: Diagnosis not present

## 2012-06-28 DIAGNOSIS — Z79899 Other long term (current) drug therapy: Secondary | ICD-10-CM | POA: Diagnosis not present

## 2012-07-09 DIAGNOSIS — Z23 Encounter for immunization: Secondary | ICD-10-CM | POA: Diagnosis not present

## 2012-07-23 DIAGNOSIS — C44319 Basal cell carcinoma of skin of other parts of face: Secondary | ICD-10-CM | POA: Diagnosis not present

## 2012-08-01 ENCOUNTER — Other Ambulatory Visit: Payer: Self-pay | Admitting: Family Medicine

## 2012-09-11 DIAGNOSIS — K9429 Other complications of gastrostomy: Secondary | ICD-10-CM | POA: Diagnosis not present

## 2012-09-11 DIAGNOSIS — L570 Actinic keratosis: Secondary | ICD-10-CM | POA: Diagnosis not present

## 2012-09-11 DIAGNOSIS — Z85828 Personal history of other malignant neoplasm of skin: Secondary | ICD-10-CM | POA: Diagnosis not present

## 2012-11-26 ENCOUNTER — Other Ambulatory Visit: Payer: Self-pay | Admitting: Family Medicine

## 2012-12-13 ENCOUNTER — Encounter: Payer: Self-pay | Admitting: Family Medicine

## 2012-12-13 ENCOUNTER — Ambulatory Visit: Payer: Medicare Other | Admitting: Family Medicine

## 2012-12-13 ENCOUNTER — Ambulatory Visit (INDEPENDENT_AMBULATORY_CARE_PROVIDER_SITE_OTHER): Payer: Medicare Other | Admitting: Family Medicine

## 2012-12-13 VITALS — BP 138/87 | HR 72 | Temp 98.2°F | Ht 69.5 in | Wt 140.0 lb

## 2012-12-13 DIAGNOSIS — J45909 Unspecified asthma, uncomplicated: Secondary | ICD-10-CM | POA: Diagnosis not present

## 2012-12-13 MED ORDER — LEVOFLOXACIN 500 MG PO TABS: 500.0000 mg | ORAL_TABLET | Freq: Every day | ORAL | Status: DC

## 2012-12-13 MED ORDER — PREDNISONE 50 MG PO TABS: ORAL_TABLET | ORAL | Status: DC

## 2012-12-13 MED ORDER — ALBUTEROL SULFATE HFA 108 (90 BASE) MCG/ACT IN AERS: 2.0000 | INHALATION_SPRAY | Freq: Four times a day (QID) | RESPIRATORY_TRACT | Status: DC | PRN

## 2012-12-13 NOTE — Patient Instructions (Signed)
Use the Albuterol if you need it.  Use it before bed.  Levaquin is the antibiotic, one pill a day.  Steroid dose pack.    Don't stop the Advair.

## 2012-12-13 NOTE — Assessment & Plan Note (Signed)
Treating his asthma exacerbation. Steroid burst plus refill for his albuterol. As his most recent chest x-ray in 2013 showed changes of COPD, treating as a COPD flare. Treating him with Levaquin as antibiotic. Follow-up in middle to late next week if no improvement.

## 2012-12-13 NOTE — Progress Notes (Signed)
Brian Leon is a 67 y.o. male who presents to [facility] today with complaints of cough and fatigue:  1.  Cough:  Patient has had increasing cough and shortness of breath for past week or so.  Productive of thick green sputum for the past several days.He has also had general malaise for past 2-3 days.  Subjective fever at home day before yesterday.  Worried that this will become PNA as he chronically takes cyclosporine and methotrexate. He does not have an albuterol inhaler at home but does have diagnosed asthma corroborated with PFTs in 2009.  No chills. No weight loss. No hemoptysis.  The following portions of the patient's history were reviewed and updated as appropriate: allergies, current medications, past medical history, family and social history, and problem list.  Patient is a nonsmoker.    Past Medical History  Diagnosis Date  . Allergy   . Asthma     PFT 2009 showed mod to severe with good reversibility with albuterol  . Osteoporosis   . Psoriasis    No past surgical history on file.  Medications reviewed. Current Outpatient Prescriptions  Medication Sig Dispense Refill  . ADVAIR DISKUS 500-50 MCG/DOSE AEPB TAKE 1 PUFF INTO THE LUNGS TWICE A DAY  60 each  2  . cycloSPORINE modified (NEORAL/GENGRAF) 100 MG capsule       . fluticasone (FLONASE) 50 MCG/ACT nasal spray PLACE 1 SPRAY INTO THE NOSE DAILY.  16 g  2  . methotrexate (RHEUMATREX) 2.5 MG tablet Take 15 mg by mouth once a week.       . triamcinolone (KENALOG) 0.1 % cream       . VENTOLIN HFA 108 (90 BASE) MCG/ACT inhaler INHALE 1 PUFF INTO THE LUNGS EVERY 4 (FOUR) HOURS AS NEEDED FOR WHEEZING.  8.5 g  0   No current facility-administered medications for this visit.    ROS as above otherwise neg.  No chest pain, palpitations, SOB, Fever, Chills, Abd pain, N/V/D.  Physical Exam:  BP 138/87  Pulse 72  Temp(Src) 98.2 F (36.8 C) (Oral)  Ht 5' 9.5" (1.765 m)  Wt 140 lb (63.504 kg)  BMI 20.39 kg/m2 Gen:  Alert,  cooperative patient who appears stated age in no acute distress.  Vital signs reviewed. HEENT: EOMI,  MMM Pulm:  Some scattered wheezes bilateral bases. No crackles noted. Cardiac:  Regular rate and rhythm without murmur auscultated.  Good S1/S2. Abd:  Soft/nondistended/nontender.  Good bowel sounds throughout all four quadrants.  No masses noted.  Exts: Non edematous BL  LE, warm and well perfused.   No results found for this or any previous visit (from the past 72 hour(s)).

## 2012-12-14 ENCOUNTER — Telehealth: Payer: Self-pay | Admitting: Family Medicine

## 2012-12-14 MED ORDER — PREDNISONE 10 MG PO TABS: ORAL_TABLET | ORAL | Status: DC

## 2012-12-14 NOTE — Telephone Encounter (Signed)
Please check with ordering provider.

## 2012-12-14 NOTE — Telephone Encounter (Signed)
Done

## 2012-12-14 NOTE — Telephone Encounter (Signed)
Patient is calling because [facility] would not fill the Prednisone the way it was written.  He thinks that it is because they want each dose to have the specific day next to it.  He would appreciate this being fixed today.

## 2012-12-14 NOTE — Telephone Encounter (Signed)
Left message on voicemail. Brian Leon  

## 2013-01-22 ENCOUNTER — Other Ambulatory Visit: Payer: Self-pay | Admitting: Family Medicine

## 2013-02-11 DIAGNOSIS — L2089 Other atopic dermatitis: Secondary | ICD-10-CM | POA: Diagnosis not present

## 2013-02-11 DIAGNOSIS — Z79899 Other long term (current) drug therapy: Secondary | ICD-10-CM | POA: Diagnosis not present

## 2013-02-11 DIAGNOSIS — A630 Anogenital (venereal) warts: Secondary | ICD-10-CM | POA: Diagnosis not present

## 2013-05-03 DIAGNOSIS — Z79899 Other long term (current) drug therapy: Secondary | ICD-10-CM | POA: Diagnosis not present

## 2013-05-03 DIAGNOSIS — L2089 Other atopic dermatitis: Secondary | ICD-10-CM | POA: Diagnosis not present

## 2013-05-18 ENCOUNTER — Other Ambulatory Visit: Payer: Self-pay | Admitting: Family Medicine

## 2013-06-14 ENCOUNTER — Other Ambulatory Visit: Payer: Self-pay | Admitting: Family Medicine

## 2013-06-21 DIAGNOSIS — L280 Lichen simplex chronicus: Secondary | ICD-10-CM | POA: Diagnosis not present

## 2013-06-21 DIAGNOSIS — Z79899 Other long term (current) drug therapy: Secondary | ICD-10-CM | POA: Diagnosis not present

## 2013-06-21 DIAGNOSIS — L570 Actinic keratosis: Secondary | ICD-10-CM | POA: Diagnosis not present

## 2013-06-21 DIAGNOSIS — L2089 Other atopic dermatitis: Secondary | ICD-10-CM | POA: Diagnosis not present

## 2013-08-26 DIAGNOSIS — L280 Lichen simplex chronicus: Secondary | ICD-10-CM | POA: Diagnosis not present

## 2013-08-26 DIAGNOSIS — L2089 Other atopic dermatitis: Secondary | ICD-10-CM | POA: Diagnosis not present

## 2013-08-26 DIAGNOSIS — L299 Pruritus, unspecified: Secondary | ICD-10-CM | POA: Diagnosis not present

## 2013-08-26 DIAGNOSIS — Z79899 Other long term (current) drug therapy: Secondary | ICD-10-CM | POA: Diagnosis not present

## 2013-10-14 DIAGNOSIS — L299 Pruritus, unspecified: Secondary | ICD-10-CM | POA: Diagnosis not present

## 2013-10-14 DIAGNOSIS — L280 Lichen simplex chronicus: Secondary | ICD-10-CM | POA: Diagnosis not present

## 2013-10-14 DIAGNOSIS — Z79899 Other long term (current) drug therapy: Secondary | ICD-10-CM | POA: Diagnosis not present

## 2013-10-14 DIAGNOSIS — Z85828 Personal history of other malignant neoplasm of skin: Secondary | ICD-10-CM | POA: Diagnosis not present

## 2013-10-14 DIAGNOSIS — L2089 Other atopic dermatitis: Secondary | ICD-10-CM | POA: Diagnosis not present

## 2013-11-18 DIAGNOSIS — L280 Lichen simplex chronicus: Secondary | ICD-10-CM | POA: Diagnosis not present

## 2013-11-18 DIAGNOSIS — Z79899 Other long term (current) drug therapy: Secondary | ICD-10-CM | POA: Diagnosis not present

## 2013-11-18 DIAGNOSIS — L2089 Other atopic dermatitis: Secondary | ICD-10-CM | POA: Diagnosis not present

## 2013-11-26 ENCOUNTER — Encounter (HOSPITAL_COMMUNITY): Payer: Self-pay | Admitting: Emergency Medicine

## 2013-11-26 ENCOUNTER — Encounter (HOSPITAL_COMMUNITY): Payer: Medicare Other | Admitting: Anesthesiology

## 2013-11-26 ENCOUNTER — Emergency Department (HOSPITAL_COMMUNITY): Payer: Medicare Other | Admitting: Anesthesiology

## 2013-11-26 ENCOUNTER — Encounter (HOSPITAL_COMMUNITY)
Admission: EM | Disposition: A | Discharge status: Home / Self Care | Payer: Self-pay | Source: Home / Self Care | Attending: Emergency Medicine

## 2013-11-26 ENCOUNTER — Ambulatory Visit: Payer: Medicare Other | Admitting: Family Medicine

## 2013-11-26 ENCOUNTER — Emergency Department (HOSPITAL_COMMUNITY): Payer: Medicare Other

## 2013-11-26 ENCOUNTER — Ambulatory Visit (HOSPITAL_COMMUNITY)
Admission: EM | Admit: 2013-11-26 | Discharge: 2013-11-27 | Disposition: A | Discharge status: Home / Self Care | Payer: Medicare Other | Attending: Urology | Admitting: Urology

## 2013-11-26 DIAGNOSIS — N1330 Unspecified hydronephrosis: Secondary | ICD-10-CM | POA: Diagnosis not present

## 2013-11-26 DIAGNOSIS — R109 Unspecified abdominal pain: Secondary | ICD-10-CM | POA: Insufficient documentation

## 2013-11-26 DIAGNOSIS — N289 Disorder of kidney and ureter, unspecified: Secondary | ICD-10-CM | POA: Diagnosis not present

## 2013-11-26 DIAGNOSIS — N210 Calculus in bladder: Secondary | ICD-10-CM | POA: Diagnosis not present

## 2013-11-26 DIAGNOSIS — N132 Hydronephrosis with renal and ureteral calculous obstruction: Secondary | ICD-10-CM | POA: Diagnosis present

## 2013-11-26 DIAGNOSIS — I10 Essential (primary) hypertension: Secondary | ICD-10-CM | POA: Diagnosis not present

## 2013-11-26 DIAGNOSIS — N201 Calculus of ureter: Secondary | ICD-10-CM | POA: Insufficient documentation

## 2013-11-26 DIAGNOSIS — Z85828 Personal history of other malignant neoplasm of skin: Secondary | ICD-10-CM | POA: Diagnosis not present

## 2013-11-26 DIAGNOSIS — L408 Other psoriasis: Secondary | ICD-10-CM | POA: Insufficient documentation

## 2013-11-26 DIAGNOSIS — N323 Diverticulum of bladder: Secondary | ICD-10-CM | POA: Diagnosis not present

## 2013-11-26 DIAGNOSIS — Z87891 Personal history of nicotine dependence: Secondary | ICD-10-CM | POA: Diagnosis not present

## 2013-11-26 DIAGNOSIS — R112 Nausea with vomiting, unspecified: Secondary | ICD-10-CM | POA: Diagnosis not present

## 2013-11-26 DIAGNOSIS — J45909 Unspecified asthma, uncomplicated: Secondary | ICD-10-CM | POA: Insufficient documentation

## 2013-11-26 DIAGNOSIS — R339 Retention of urine, unspecified: Secondary | ICD-10-CM | POA: Diagnosis not present

## 2013-11-26 DIAGNOSIS — M810 Age-related osteoporosis without current pathological fracture: Secondary | ICD-10-CM | POA: Diagnosis not present

## 2013-11-26 HISTORY — DX: Other complications of anesthesia, initial encounter: T88.59XA

## 2013-11-26 HISTORY — PX: CYSTOSCOPY W/ URETERAL STENT PLACEMENT: SHX1429

## 2013-11-26 HISTORY — PX: HOLMIUM LASER APPLICATION: SHX5852

## 2013-11-26 HISTORY — DX: Essential (primary) hypertension: I10

## 2013-11-26 HISTORY — DX: Adverse effect of unspecified anesthetic, initial encounter: T41.45XA

## 2013-11-26 LAB — URINALYSIS, ROUTINE W REFLEX MICROSCOPIC
Bilirubin Urine: NEGATIVE
Glucose, UA: NEGATIVE mg/dL
Ketones, ur: 15 mg/dL — AB
Nitrite: NEGATIVE
Protein, ur: 100 mg/dL — AB
Specific Gravity, Urine: 1.017 (ref 1.005–1.030)
Urobilinogen, UA: 0.2 mg/dL (ref 0.0–1.0)
pH: 7.5 (ref 5.0–8.0)

## 2013-11-26 LAB — URINE MICROSCOPIC-ADD ON

## 2013-11-26 LAB — BASIC METABOLIC PANEL
BUN: 13 mg/dL (ref 6–23)
CO2: 23 mEq/L (ref 19–32)
Calcium: 9.6 mg/dL (ref 8.4–10.5)
Chloride: 106 mEq/L (ref 96–112)
Creatinine, Ser: 0.89 mg/dL (ref 0.50–1.35)
GFR calc Af Amer: >90 mL/min (ref >90)
GFR calc non Af Amer: 85 mL/min — ABNORMAL LOW (ref >90)
Glucose, Bld: 115 mg/dL — ABNORMAL HIGH (ref 70–99)
Potassium: 3.9 mEq/L (ref 3.7–5.3)
Sodium: 145 mEq/L (ref 137–147)

## 2013-11-26 LAB — CBC WITH DIFFERENTIAL/PLATELET
Basophils Absolute: 0.1 10*3/uL (ref 0.0–0.1)
Basophils Relative: 1 % (ref 0–1)
Eosinophils Absolute: 1.5 10*3/uL — ABNORMAL HIGH (ref 0.0–0.7)
Eosinophils Relative: 9 % — ABNORMAL HIGH (ref 0–5)
HCT: 42.6 % (ref 39.0–52.0)
Hemoglobin: 14.8 g/dL (ref 13.0–17.0)
Lymphocytes Relative: 13 % (ref 12–46)
Lymphs Abs: 2.2 10*3/uL (ref 0.7–4.0)
MCH: 32.6 pg (ref 26.0–34.0)
MCHC: 34.7 g/dL (ref 30.0–36.0)
MCV: 93.8 fL (ref 78.0–100.0)
Monocytes Absolute: 1.8 10*3/uL — ABNORMAL HIGH (ref 0.1–1.0)
Monocytes Relative: 10 % (ref 3–12)
Neutro Abs: 11.7 10*3/uL — ABNORMAL HIGH (ref 1.7–7.7)
Neutrophils Relative %: 68 % (ref 43–77)
Platelets: 401 10*3/uL — ABNORMAL HIGH (ref 150–400)
RBC: 4.54 MIL/uL (ref 4.22–5.81)
RDW: 12.8 % (ref 11.5–15.5)
WBC: 17.2 10*3/uL — ABNORMAL HIGH (ref 4.0–10.5)

## 2013-11-26 SURGERY — CYSTOSCOPY, WITH RETROGRADE PYELOGRAM AND URETERAL STENT INSERTION
Anesthesia: General | Site: Ureter | Laterality: Left

## 2013-11-26 MED ORDER — ONDANSETRON HCL 4 MG/2ML IJ SOLN
4.0000 mg | Freq: Once | INTRAMUSCULAR | Status: AC
  Administered 2013-11-26: 4 mg via INTRAVENOUS
  Filled 2013-11-26: qty 2

## 2013-11-26 MED ORDER — MORPHINE SULFATE 4 MG/ML IJ SOLN
4.0000 mg | Freq: Once | INTRAMUSCULAR | Status: AC
  Administered 2013-11-26: 4 mg via INTRAVENOUS
  Filled 2013-11-26: qty 1

## 2013-11-26 MED ORDER — PROPOFOL 10 MG/ML IV BOLUS
INTRAVENOUS | Status: DC | PRN
  Administered 2013-11-26: 200 mg via INTRAVENOUS

## 2013-11-26 MED ORDER — KCL IN DEXTROSE-NACL 10-5-0.45 MEQ/L-%-% IV SOLN
INTRAVENOUS | Status: DC
  Administered 2013-11-26: 22:00:00 via INTRAVENOUS
  Filled 2013-11-26 (×3): qty 1000

## 2013-11-26 MED ORDER — MOMETASONE FURO-FORMOTEROL FUM 200-5 MCG/ACT IN AERO
2.0000 | INHALATION_SPRAY | Freq: Two times a day (BID) | RESPIRATORY_TRACT | Status: DC
  Filled 2013-11-26: qty 8.8

## 2013-11-26 MED ORDER — GENTAMICIN IN SALINE 1.6-0.9 MG/ML-% IV SOLN: 80.0000 mg | Freq: Once | INTRAVENOUS | Status: DC

## 2013-11-26 MED ORDER — ONDANSETRON HCL 4 MG/2ML IJ SOLN
INTRAMUSCULAR | Status: DC | PRN
  Administered 2013-11-26: 4 mg via INTRAVENOUS

## 2013-11-26 MED ORDER — CYCLOSPORINE MODIFIED (NEORAL) 100 MG PO CAPS
100.0000 mg | ORAL_CAPSULE | Freq: Two times a day (BID) | ORAL | Status: DC
  Administered 2013-11-26: 100 mg via ORAL
  Filled 2013-11-26 (×3): qty 1

## 2013-11-26 MED ORDER — IOHEXOL 300 MG/ML  SOLN
100.0000 mL | Freq: Once | INTRAMUSCULAR | Status: AC | PRN
  Administered 2013-11-26: 100 mL via INTRAVENOUS

## 2013-11-26 MED ORDER — HYDROMORPHONE HCL PF 1 MG/ML IJ SOLN
0.5000 mg | INTRAMUSCULAR | Status: DC | PRN
  Administered 2013-11-27 (×2): 1 mg via INTRAVENOUS
  Filled 2013-11-26 (×2): qty 1

## 2013-11-26 MED ORDER — SODIUM CHLORIDE 0.9 % IV BOLUS (SEPSIS)
1000.0000 mL | Freq: Once | INTRAVENOUS | Status: AC
  Administered 2013-11-26: 1000 mL via INTRAVENOUS

## 2013-11-26 MED ORDER — ALBUTEROL SULFATE HFA 108 (90 BASE) MCG/ACT IN AERS: 1.0000 | INHALATION_SPRAY | Freq: Four times a day (QID) | RESPIRATORY_TRACT | Status: DC | PRN

## 2013-11-26 MED ORDER — ALBUTEROL SULFATE (2.5 MG/3ML) 0.083% IN NEBU: 2.5000 mg | INHALATION_SOLUTION | Freq: Four times a day (QID) | RESPIRATORY_TRACT | Status: DC | PRN

## 2013-11-26 MED ORDER — MIDAZOLAM HCL 2 MG/2ML IJ SOLN
INTRAMUSCULAR | Status: AC
  Filled 2013-11-26: qty 2

## 2013-11-26 MED ORDER — DEXAMETHASONE SODIUM PHOSPHATE 10 MG/ML IJ SOLN
INTRAMUSCULAR | Status: DC | PRN
  Administered 2013-11-26: 10 mg via INTRAVENOUS

## 2013-11-26 MED ORDER — SODIUM CHLORIDE 0.9 % IV SOLN
INTRAVENOUS | Status: DC | PRN
  Administered 2013-11-26: 19:00:00 via INTRAVENOUS

## 2013-11-26 MED ORDER — SODIUM CHLORIDE 0.9 % IR SOLN
Status: DC | PRN
  Administered 2013-11-26: 3000 mL

## 2013-11-26 MED ORDER — IOHEXOL 300 MG/ML  SOLN
INTRAMUSCULAR | Status: DC | PRN
  Administered 2013-11-26: 10 mL via URETHRAL

## 2013-11-26 MED ORDER — PROPOFOL 10 MG/ML IV BOLUS
INTRAVENOUS | Status: AC
  Filled 2013-11-26: qty 20

## 2013-11-26 MED ORDER — LIDOCAINE HCL 2 % EX GEL
CUTANEOUS | Status: AC
  Filled 2013-11-26: qty 10

## 2013-11-26 MED ORDER — GLYCOPYRROLATE 0.2 MG/ML IJ SOLN
INTRAMUSCULAR | Status: DC | PRN
  Administered 2013-11-26: 0.2 mg via INTRAVENOUS

## 2013-11-26 MED ORDER — KETOROLAC TROMETHAMINE 30 MG/ML IJ SOLN
30.0000 mg | Freq: Once | INTRAMUSCULAR | Status: AC
  Administered 2013-11-26: 30 mg via INTRAVENOUS
  Filled 2013-11-26: qty 1

## 2013-11-26 MED ORDER — FENTANYL CITRATE 0.05 MG/ML IJ SOLN
INTRAMUSCULAR | Status: AC
  Filled 2013-11-26: qty 5

## 2013-11-26 MED ORDER — 0.9 % SODIUM CHLORIDE (POUR BTL) OPTIME
TOPICAL | Status: DC | PRN
  Administered 2013-11-26: 1000 mL

## 2013-11-26 MED ORDER — GENTAMICIN SULFATE 40 MG/ML IJ SOLN
5.0000 mg/kg | INTRAVENOUS | Status: AC
  Administered 2013-11-26: 320 mg via INTRAVENOUS
  Filled 2013-11-26: qty 8

## 2013-11-26 MED ORDER — BELLADONNA ALKALOIDS-OPIUM 16.2-60 MG RE SUPP
RECTAL | Status: AC
  Filled 2013-11-26: qty 1

## 2013-11-26 MED ORDER — ONDANSETRON HCL 4 MG/2ML IJ SOLN: 4.0000 mg | INTRAMUSCULAR | Status: DC | PRN

## 2013-11-26 MED ORDER — FENTANYL CITRATE 0.05 MG/ML IJ SOLN
INTRAMUSCULAR | Status: DC | PRN
  Administered 2013-11-26 (×2): 100 ug via INTRAVENOUS

## 2013-11-26 MED ORDER — HYDROMORPHONE HCL PF 1 MG/ML IJ SOLN: 0.2500 mg | INTRAMUSCULAR | Status: DC | PRN

## 2013-11-26 MED ORDER — LACTATED RINGERS IV SOLN
INTRAVENOUS | Status: DC
  Administered 2013-11-26: 20:00:00 via INTRAVENOUS

## 2013-11-26 MED ORDER — DOCUSATE SODIUM 100 MG PO CAPS
100.0000 mg | ORAL_CAPSULE | Freq: Two times a day (BID) | ORAL | Status: DC
  Administered 2013-11-26: 100 mg via ORAL
  Filled 2013-11-26 (×3): qty 1

## 2013-11-26 MED ORDER — IOHEXOL 300 MG/ML  SOLN
25.0000 mL | Freq: Once | INTRAMUSCULAR | Status: AC | PRN
  Administered 2013-11-26: 25 mL via ORAL

## 2013-11-26 MED ORDER — SENNA 8.6 MG PO TABS
1.0000 | ORAL_TABLET | Freq: Two times a day (BID) | ORAL | Status: DC
  Administered 2013-11-26: 8.6 mg via ORAL
  Filled 2013-11-26: qty 1

## 2013-11-26 MED ORDER — LIDOCAINE HCL (CARDIAC) 10 MG/ML IV SOLN
INTRAVENOUS | Status: DC | PRN
  Administered 2013-11-26: 50 mg via INTRAVENOUS

## 2013-11-26 MED ORDER — OXYCODONE HCL 5 MG PO TABS: 5.0000 mg | ORAL_TABLET | ORAL | Status: DC | PRN

## 2013-11-26 MED ORDER — FLUTICASONE PROPIONATE 50 MCG/ACT NA SUSP
1.0000 | Freq: Every day | NASAL | Status: DC
  Administered 2013-11-26: 1 via NASAL
  Filled 2013-11-26: qty 16

## 2013-11-26 MED ORDER — PHENYLEPHRINE HCL 10 MG/ML IJ SOLN
INTRAMUSCULAR | Status: DC | PRN
  Administered 2013-11-26 (×2): 80 ug via INTRAVENOUS

## 2013-11-26 MED ORDER — EPHEDRINE SULFATE 50 MG/ML IJ SOLN
INTRAMUSCULAR | Status: DC | PRN
  Administered 2013-11-26: 10 mg via INTRAVENOUS

## 2013-11-26 MED ORDER — DIATRIZOATE MEGLUMINE 30 % UR SOLN
URETHRAL | Status: DC | PRN
  Administered 2013-11-26: 300 mL via URETHRAL

## 2013-11-26 MED ORDER — MIDAZOLAM HCL 5 MG/5ML IJ SOLN
INTRAMUSCULAR | Status: DC | PRN
  Administered 2013-11-26: 2 mg via INTRAVENOUS

## 2013-11-26 MED ORDER — PROMETHAZINE HCL 25 MG/ML IJ SOLN: 6.2500 mg | INTRAMUSCULAR | Status: DC | PRN

## 2013-11-26 SURGICAL SUPPLY — 28 items
BAG URINE DRAINAGE (UROLOGICAL SUPPLIES) ×4 IMPLANT
BASKET LASER NITINOL 1.9FR (BASKET) IMPLANT
BASKET STNLS GEMINI 4WIRE 3FR (BASKET) IMPLANT
BASKET ZERO TIP NITINOL 2.4FR (BASKET) IMPLANT
CATH INTERMIT  6FR 70CM (CATHETERS) ×4 IMPLANT
CLOTH BEACON ORANGE TIMEOUT ST (SAFETY) ×4 IMPLANT
DRAPE CAMERA CLOSED 9X96 (DRAPES) ×4 IMPLANT
ELECT REM PT RETURN 9FT ADLT (ELECTROSURGICAL)
ELECTRODE REM PT RTRN 9FT ADLT (ELECTROSURGICAL) IMPLANT
FIBER LASER FLEXIVA 200 (UROLOGICAL SUPPLIES) ×4 IMPLANT
FIBER LASER FLEXIVA 365 (UROLOGICAL SUPPLIES) IMPLANT
GLOVE BIOGEL M STRL SZ7.5 (GLOVE) ×4 IMPLANT
GLOVE BIOGEL PI IND STRL 7.5 (GLOVE) ×2 IMPLANT
GLOVE BIOGEL PI INDICATOR 7.5 (GLOVE) ×2
GLOVE SURG SS PI 7.5 STRL IVOR (GLOVE) ×4 IMPLANT
GOWN L4 XXLG W/PAP TWL (GOWN DISPOSABLE) ×4 IMPLANT
GOWN STRL REUS W/TWL LRG LVL3 (GOWN DISPOSABLE) ×4 IMPLANT
GUIDEWIRE ANG ZIPWIRE 038X150 (WIRE) ×4 IMPLANT
GUIDEWIRE STR DUAL SENSOR (WIRE) ×4 IMPLANT
IV NS 1000ML (IV SOLUTION) ×6
IV NS 1000ML BAXH (IV SOLUTION) ×6 IMPLANT
IV NS IRRIG 3000ML ARTHROMATIC (IV SOLUTION) ×4 IMPLANT
PACK CYSTO (CUSTOM PROCEDURE TRAY) ×4 IMPLANT
STENT CONTOUR 6FRX24X.038 (STENTS) ×4 IMPLANT
STENT CONTOUR 6FRX26X.038 (STENTS) ×4 IMPLANT
SYRINGE 10CC LL (SYRINGE) IMPLANT
SYRINGE IRR TOOMEY STRL 70CC (SYRINGE) IMPLANT
TUBE FEEDING 8FR 16IN STR KANG (MISCELLANEOUS) ×4 IMPLANT

## 2013-11-26 NOTE — ED Notes (Signed)
Pt states unable to given urine sample at this time

## 2013-11-26 NOTE — ED Notes (Signed)
Bed: WA04 Expected date:  Expected time:  Means of arrival:  Comments: [facility] Pt being sent from [facility]

## 2013-11-26 NOTE — Anesthesia Preprocedure Evaluation (Signed)
Anesthesia Evaluation  Patient identified by MRN, date of birth, ID band Patient awake    Reviewed: Allergy & Precautions, H&P , NPO status , Patient's Chart, lab work & pertinent test results  Airway Mallampati: II TM Distance: >3 FB Neck ROM: Full    Dental  (+) Teeth Intact, Caps, Dental Advisory Given   Pulmonary neg pulmonary ROS, asthma , former smoker,  breath sounds clear to auscultation  Pulmonary exam normal       Cardiovascular hypertension, negative cardio ROS  Rhythm:Regular Rate:Normal     Neuro/Psych negative neurological ROS  negative psych ROS   GI/Hepatic negative GI ROS, Neg liver ROS,   Endo/Other  negative endocrine ROS  Renal/GU negative Renal ROS  negative genitourinary   Musculoskeletal negative musculoskeletal ROS (+)   Abdominal   Peds  Hematology negative hematology ROS (+)   Anesthesia Other Findings   Reproductive/Obstetrics                           Anesthesia Physical Anesthesia Plan  ASA: II and emergent  Anesthesia Plan: General   Post-op Pain Management:    Induction: Intravenous  Airway Management Planned: LMA  Additional Equipment:   Intra-op Plan:   Post-operative Plan: Extubation in OR  Informed Consent: I have reviewed the patients History and Physical, chart, labs and discussed the procedure including the risks, benefits and alternatives for the proposed anesthesia with the patient or authorized representative who has indicated his/her understanding and acceptance.   Dental advisory given  Plan Discussed with: CRNA  Anesthesia Plan Comments:         Anesthesia Quick Evaluation

## 2013-11-26 NOTE — ED Notes (Signed)
Pt transported to OR

## 2013-11-26 NOTE — H&P (Signed)
Brian Leon is an 68 y.o. male.    Chief Complaint: Bilateral Ureteral Stones, BLadder Stones, Rt Renal Cystic Mass  HPI:   1 - Bilateral Ureteral Stones with Left Severe Hydronephrosis - CT with left distal ureteral stones (1.2, 1.6cm) and severe hydro, as well as Rt distal stone (no hydro). Cr 0.85. WBC 17k. No fever, tachycardia, or sig bacteruria.  2 - Bladder Stone -   3 - Bilateral Paraureteral Diverticulae - Incidental bilateral paraureteral diverticulae by ER CT 11/2013.  4 - Rt Cystic Renal Mass - Pt with 5cm Rt lower-mid BIIF renal cyst with thick wall and calcifications incidental on ER CT 11/2013.   PMH sig for severe psoriasis (on cyclosporine), eye surgery, right inguinal hernia repair. No CV disease. No strong blood thinners.   Today Brian Leon is seen as ER consult for above.   Past Medical History  Diagnosis Date  . Allergy   . Asthma     PFT 2009 showed mod to severe with good reversibility with albuterol  . Osteoporosis   . Psoriasis   . Skin cancer   . Hypertension     Past Surgical History  Procedure Laterality Date  . Hernia repair      Family History  Problem Relation Age of Onset  . Cancer Mother    Social History:  reports that he quit smoking about 29 years ago. He has never used smokeless tobacco. He reports that he does not drink alcohol. His drug history is not on file.  Allergies:  Allergies  Allergen Reactions  . Sulfa Drugs Cross Reactors Other (See Comments)    Flu like symptoms     (Not in a hospital admission)  Results for orders placed during the hospital encounter of 11/26/13 (from the past 48 hour(s))  CBC WITH DIFFERENTIAL     Status: Abnormal   Collection Time    11/26/13 12:48 PM      Result Value Ref Range   WBC 17.2 (*) 4.0 - 10.5 K/uL   RBC 4.54  4.22 - 5.81 MIL/uL   Hemoglobin 14.8  13.0 - 17.0 g/dL   HCT 42.6  39.0 - 52.0 %   MCV 93.8  78.0 - 100.0 fL   MCH 32.6  26.0 - 34.0 pg   MCHC 34.7  30.0 - 36.0 g/dL   RDW  12.8  11.5 - 15.5 %   Platelets 401 (*) 150 - 400 K/uL   Neutrophils Relative % 68  43 - 77 %   Neutro Abs 11.7 (*) 1.7 - 7.7 K/uL   Lymphocytes Relative 13  12 - 46 %   Lymphs Abs 2.2  0.7 - 4.0 K/uL   Monocytes Relative 10  3 - 12 %   Monocytes Absolute 1.8 (*) 0.1 - 1.0 K/uL   Eosinophils Relative 9 (*) 0 - 5 %   Eosinophils Absolute 1.5 (*) 0.0 - 0.7 K/uL   Basophils Relative 1  0 - 1 %   Basophils Absolute 0.1  0.0 - 0.1 K/uL  BASIC METABOLIC PANEL     Status: Abnormal   Collection Time    11/26/13 12:48 PM      Result Value Ref Range   Sodium 145  137 - 147 mEq/L   Potassium 3.9  3.7 - 5.3 mEq/L   Chloride 106  96 - 112 mEq/L   CO2 23  19 - 32 mEq/L   Glucose, Bld 115 (*) 70 - 99 mg/dL   BUN 13  6 -  23 mg/dL   Creatinine, Ser 0.89  0.50 - 1.35 mg/dL   Calcium 9.6  8.4 - 10.5 mg/dL   GFR calc non Af Amer 85 (*) >90 mL/min   GFR calc Af Amer >90  >90 mL/min   Comment: (NOTE)     The eGFR has been calculated using the CKD EPI equation.     This calculation has not been validated in all clinical situations.     eGFR's persistently <90 mL/min signify possible Chronic Kidney     Disease.  URINALYSIS, ROUTINE W REFLEX MICROSCOPIC     Status: Abnormal   Collection Time    11/26/13  1:32 PM      Result Value Ref Range   Color, Urine YELLOW  YELLOW   APPearance CLEAR  CLEAR   Specific Gravity, Urine 1.017  1.005 - 1.030   pH 7.5  5.0 - 8.0   Glucose, UA NEGATIVE  NEGATIVE mg/dL   Hgb urine dipstick MODERATE (*) NEGATIVE   Bilirubin Urine NEGATIVE  NEGATIVE   Ketones, ur 15 (*) NEGATIVE mg/dL   Protein, ur 100 (*) NEGATIVE mg/dL   Urobilinogen, UA 0.2  0.0 - 1.0 mg/dL   Nitrite NEGATIVE  NEGATIVE   Leukocytes, UA MODERATE (*) NEGATIVE  URINE MICROSCOPIC-ADD ON     Status: Abnormal   Collection Time    11/26/13  1:32 PM      Result Value Ref Range   Squamous Epithelial / LPF RARE  RARE   WBC, UA 21-50  <3 WBC/hpf   RBC / HPF 11-20  <3 RBC/hpf   Bacteria, UA FEW (*)  RARE   Ct Abdomen Pelvis W Contrast  11/26/2013   CLINICAL DATA:  EMESIS ABDOMINAL PAIN  EXAM: CT ABDOMEN AND PELVIS WITH CONTRAST  TECHNIQUE: Multidetector CT imaging of the abdomen and pelvis was performed using the standard protocol following bolus administration of intravenous contrast.  CONTRAST:  134m OMNIPAQUE IOHEXOL 300 MG/ML  SOLN  COMPARISON:  None.  FINDINGS: Areas of ill-defined increased density projects within the posterior and lateral aspects of the left lung base. There is prominence of the interstitial markings.  Evaluation of the left kidney demonstrates moderate hydronephrosis with severe hydroureter. Within the distal left ureter just proximal to the vesicoureteral junction calculi appreciated. The smaller more proximal calculus measures 1 cm in diameter. This distal to this area a second 1.6 cm calculus is appreciated. A 7.5 mm calculus projects within the base of the urinary bladder on the left. A small bladder diverticulum is appreciated within the anterior aspect of the urinary bladder on the left. A small 3.7 mm low attenuating focus projects along the posterior cortical aspect of the kidney. This finding is too small for characterization. Left kidney is otherwise unremarkable.  Evaluation of the right kidney demonstrates minimal pelviectasis without evidence of hydronephrosis nor hydroureter. Within the distal right ureter just proximal to the vesicoureteral junction a 1.2 cm calculus is appreciated. A 5.7 x 6.1 cm cystic lesion is appreciated within the posterior lateral lower pole of the right kidney. This finding demonstrates Hounsfield units of 15 on immediate contrast imaging and 15 on delayed imaging. There is no definitive enhancing soft tissue component. The walls of the cyst are thickened and demonstrate areas of lobulation. Areas of calcification are also identified within the walls.  A 2.1 cm calculus projects within the central posterior base of the urinary bladder. A small  anterior diverticulum is identified within the urinary bladder.  The liver, spleen,  adrenals, pancreas are unremarkable.  There is no evidence of bowel obstruction, enteritis, colitis, diverticulitis, nor appendicitis.  There is no evidence of abdominal aortic aneurysm. Atherosclerotic calcifications identified within the aorta. The celiac, SMA, IMA, portal vein, SMV are opacified.  There is no evidence of abdominal or pelvic free fluid, loculated fluid collections, masses or adenopathy. Diverticulosis is appreciated within the sigmoid colon without evidence of diverticulitis.  There is no evidence of abdominal wall nor inguinal hernia.  Evaluation osseous structures demonstrates no evidence of aggressive appearing osseous lesions.  IMPRESSION: 1. A 1.6 and 1 cm calculus is identified within the distal left ureter with associated moderate to severe obstructive uropathy. 2. Calculi within the urinary bladder 3. A 1.2 cm distal right ureteral calculus without evidence of associated obstructive uropathy 4. Complex appearing cystic mass within the right kidney with indeterminate characteristics, Bosniak IIF surveillance evaluation in 4-6 months with triphasic CT is recommended. 5. Atherosclerotic calcifications are identified within the aorta. 6. Areas of increased density within the lung bases differential considerations are areas of atelectasis versus infiltrate. Correlation with patient's history is recommended and in the absence of clinical signs and symptoms of infection further evaluation with nonemergent dedicated chest CT is recommended. 7. Sigmoid diverticulosis without evidence of diverticulitis.   Electronically Signed   By: Margaree Mackintosh M.D.   On: 11/26/2013 15:55    Review of Systems  Constitutional: Negative for fever, chills and malaise/fatigue.  HENT: Negative.   Eyes: Negative.   Respiratory: Negative.   Cardiovascular: Negative.   Gastrointestinal: Positive for nausea and vomiting.   Genitourinary: Positive for flank pain.  Musculoskeletal: Negative.   Skin: Negative.   Neurological: Negative.   Endo/Heme/Allergies: Negative.   Psychiatric/Behavioral: Negative.     Blood pressure 146/91, pulse 83, temperature 97.2 F (36.2 C), temperature source Oral, height _0  (1.778 m), weight 63.504 kg (140 lb), SpO2 95.00%. Physical Exam  Constitutional: He is oriented to person, place, and time. He appears well-developed and well-nourished.  HENT:  Head: Normocephalic.  Eyes: EOM are normal. Pupils are equal, round, and reactive to light.  Neck: Normal range of motion. Neck supple.  Cardiovascular: Normal rate.   Respiratory: Effort normal.  GI: Soft.  Genitourinary: Penis normal.  Mild left CVAT  Musculoskeletal: Normal range of motion.  Neurological: He is alert and oriented to person, place, and time.  Skin: Skin is warm and dry.  Psychiatric: He has a normal mood and affect. His behavior is normal. Judgment and thought content normal.     Assessment/Plan  1 - Bilateral Ureteral Stones with Left Severe Hydronephrosis -Significant left obstruction and impending right obstruction from very large volume stone disease. Will need multi-stage surgical approach to achieve stone free status. Would rec begin with cysto, bilat retrogrades, cytolithalopexy, rt ureteroscopic stone manipulation, bilateral stents today followed by left ureteroscopic stone manipulation in several weeks.  Given appearance of left ureter, he may well even require percutaneues drainage / approach if unable to establish retrograde continuity.   He wants to proceed with cysto, bilat stents, cystolithalopexy, rt ureteroscopic stone manipulation today, will need at least one more stage surgery to get stone free.  We discussed ureteroscopic stone manipulation with basketing and laser-lithotripsy with cystolithalopexy in detail.  We discussed risks including bleeding, infection, damage to kidney / ureter   bladder, rarely loss of kidney. We discussed anesthetic risks and rare but serious surgical complications including DVT, PE, MI, and mortality. We specifically addressed that in 5-10% of cases a  staged approach is required with stenting followed by re-attempt ureteroscopy if anatomy unfavorable. The patient voiced understanding and wises to proceed.   2 - Bladder Stone - Unclear if primary or secondary for prior ureteral, either way, will address as per above.   3 - Bilateral Paraureteral Diverticulae - will assess further at surgery.   4 - Rt Cystic Renal Mass - Mass certainly warrants f/u, preferably with MRI in 20mo as best sensitivity in cystic masses.  Frimet Durfee 11/26/2013, 4:39 PM

## 2013-11-26 NOTE — Brief Op Note (Signed)
11/26/2013  8:37 PM  PATIENT:  Brian Leon  68 y.o. male  PRE-OPERATIVE DIAGNOSIS:  URETRAL STONES WITH BILATERAL STENT PLACEMENT  POST-OPERATIVE DIAGNOSIS:  * No post-op diagnosis entered *  PROCEDURE:  Procedure(s) with comments: CYSTOSCOPY WITH RETROGRADE PYELOGRAM/URETERAL BILATERAL STENT PLACEMENT (Bilateral) - ureter HOLMIUM LASER APPLICATION (Bilateral) - ureter  SURGEON:  Surgeon(s) and Role:    * Alexis Frock, MD - Primary  PHYSICIAN ASSISTANT:   ASSISTANTS: none   ANESTHESIA:   general  EBL:     BLOOD ADMINISTERED:none  DRAINS: none   LOCAL MEDICATIONS USED:  NONE  SPECIMEN:  No Specimen  DISPOSITION OF SPECIMEN:  N/A  COUNTS:  YES  TOURNIQUET:  * No tourniquets in log *  DICTATION: .Other Dictation: Dictation Number [id number]  PLAN OF CARE: Admit for overnight observation  PATIENT DISPOSITION:  PACU - hemodynamically stable.   Delay start of Pharmacological VTE agent (>24hrs) due to surgical blood loss or risk of bleeding: not applicable

## 2013-11-26 NOTE — Transfer of Care (Signed)
Immediate Anesthesia Transfer of Care Note  Patient: Brian Leon  Procedure(s) Performed: Procedure(s): CYSTOSCOPY WITH BILATERAL  RETROGRADE Justin Mend  Wyvonnia Dusky BILATERAL STENT PLACEMENT  /CYSTOGRAM / LEFT  URETER1ST STAGE URETEROSCOPY WITH LASER (Bilateral) HOLMIUM LASER APPLICATION (Left)  Patient Location: PACU  Anesthesia Type:General  Level of Consciousness: awake, alert , oriented and patient cooperative  Airway & Oxygen Therapy: Patient Spontanous Breathing and Patient connected to face mask oxygen  Post-op Assessment: Report given to PACU RN, Post -op Vital signs reviewed and stable and Patient moving all extremities X 4  Post vital signs: stable  Complications: No apparent anesthesia complications

## 2013-11-26 NOTE — ED Provider Notes (Signed)
Medical screening examination/treatment/procedure(s) were performed by non-physician practitioner and as supervising physician I was immediately available for consultation/collaboration.  EKG Interpretation   None        Threasa Beards, MD 11/26/13 [phone]

## 2013-11-26 NOTE — ED Notes (Signed)
Notified [facility] for transportation to [facility] ED

## 2013-11-26 NOTE — ED Notes (Signed)
Pt reports vomiting for 3 hours, LLQ abdominal pain. Also constipation for several weeks. Dry heaving at current.

## 2013-11-26 NOTE — ED Provider Notes (Signed)
Patient stable on arrival to [facility] ED. Dr. Tresa Moore evaluated, will take to OR. Will give pain meds at this time, keep NPO.  Ephraim Hamburger, MD 11/26/13 [phone]

## 2013-11-26 NOTE — ED Provider Notes (Signed)
CSN: [id number]     Arrival date & time 11/26/13  1221 History   First MD Initiated Contact with Patient 11/26/13 1224     Chief Complaint  Patient presents with  . Emesis  . Abdominal Pain     (Consider location/radiation/quality/duration/timing/severity/associated sxs/prior Treatment) HPI Comments: Patient is a 68 yo M PMHx significant for osteoporosis, asthma, psoriasis presenting to the ED for acute onset of moderate throbbing non-radiating LLQ abdominal pain with associated nausea and non-bloody non-bilious emesis that began this morning. Patient states prior to onset of pain he felt constipated despite trying an enema this AM with minimal relief. He denies any alleviating or aggravating factors. Patient's abdominal surgical history includes hernia repair.    Past Medical History  Diagnosis Date  . Allergy   . Asthma     PFT 2009 showed mod to severe with good reversibility with albuterol  . Osteoporosis   . Psoriasis   . Skin cancer   . Hypertension    Past Surgical History  Procedure Laterality Date  . Hernia repair     Family History  Problem Relation Age of Onset  . Cancer Mother    History  Substance Use Topics  . Smoking status: Former Smoker    Quit date: 12/13/1983  . Smokeless tobacco: Never Used  . Alcohol Use: No    Review of Systems  Constitutional: Positive for chills. Negative for fever.  Respiratory: Negative for shortness of breath.   Cardiovascular: Negative for chest pain.  Gastrointestinal: Positive for nausea, vomiting, abdominal pain and constipation. Negative for diarrhea, blood in stool, anal bleeding and rectal pain.  Genitourinary: Positive for urgency (baseline) and frequency (baseline). Negative for hematuria, decreased urine volume, discharge, penile swelling, scrotal swelling, penile pain and testicular pain.  All other systems reviewed and are negative.      Allergies  Sulfa drugs cross reactors  Home Medications   Current  Outpatient Rx  Name  Route  Sig  Dispense  Refill  . albuterol (PROVENTIL HFA;VENTOLIN HFA) 108 (90 BASE) MCG/ACT inhaler   Inhalation   Inhale 1-2 puffs into the lungs every 6 (six) hours as needed for wheezing or shortness of breath.         . cycloSPORINE modified (NEORAL/GENGRAF) 100 MG capsule   Oral   Take 100 mg by mouth 2 (two) times daily.          . fluocinonide cream (LIDEX) 0.05 %   Topical   Apply 1 application topically 2 (two) times daily.         . fluticasone (FLONASE) 50 MCG/ACT nasal spray   Each Nare   Place 1 spray into both nostrils daily.         . Fluticasone-Salmeterol (ADVAIR) 500-50 MCG/DOSE AEPB   Inhalation   Inhale 1 puff into the lungs 2 (two) times daily.         Marland Kitchen leflunomide (ARAVA) 10 MG tablet   Oral   Take 10 mg by mouth daily.          BP 154/93  Pulse 52  Temp(Src) 97.2 F (36.2 C) (Oral)  Ht 5\' 10"  (1.778 m)  Wt 140 lb (63.504 kg)  BMI 20.09 kg/m2  SpO2 97% Physical Exam  Constitutional: He is oriented to person, place, and time. He appears well-developed and well-nourished. No distress.  HENT:  Head: Normocephalic and atraumatic.  Right Ear: External ear normal.  Left Ear: External ear normal.  Nose: Nose normal.  Mouth/Throat: Oropharynx is  clear and moist. No oropharyngeal exudate.  Eyes: Conjunctivae are normal.  Neck: Neck supple.  Cardiovascular: Normal rate, regular rhythm, normal heart sounds and intact distal pulses.   Pulmonary/Chest: Effort normal and breath sounds normal. No respiratory distress.  Abdominal: Soft. Bowel sounds are normal. He exhibits no distension. There is tenderness in the left lower quadrant. There is no rigidity, no rebound and no guarding.  Musculoskeletal: Normal range of motion. He exhibits no edema.  Lymphadenopathy:    He has no cervical adenopathy.  Neurological: He is alert and oriented to person, place, and time.  Skin: Skin is warm and dry. He is not diaphoretic.    ED  Course  Procedures (including critical care time) Medications  ketorolac (TORADOL) 30 MG/ML injection 30 mg (not administered)  ondansetron (ZOFRAN) injection 4 mg (not administered)  morphine 4 MG/ML injection 4 mg (4 mg Intravenous Given 11/26/13 1343)  ondansetron (ZOFRAN) injection 4 mg (4 mg Intravenous Given 11/26/13 1343)  sodium chloride 0.9 % bolus 1,000 mL (1,000 mLs Intravenous New Bag/Given 11/26/13 1344)  iohexol (OMNIPAQUE) 300 MG/ML solution 25 mL (25 mLs Oral Contrast Given 11/26/13 1402)  iohexol (OMNIPAQUE) 300 MG/ML solution 100 mL (100 mLs Intravenous Contrast Given 11/26/13 1511)    Labs Review Labs Reviewed  CBC WITH DIFFERENTIAL - Abnormal; Notable for the following:    WBC 17.2 (*)    Platelets 401 (*)    Neutro Abs 11.7 (*)    Monocytes Absolute 1.8 (*)    Eosinophils Relative 9 (*)    Eosinophils Absolute 1.5 (*)    All other components within normal limits  BASIC METABOLIC PANEL - Abnormal; Notable for the following:    Glucose, Bld 115 (*)    GFR calc non Af Amer 85 (*)    All other components within normal limits  URINALYSIS, ROUTINE W REFLEX MICROSCOPIC - Abnormal; Notable for the following:    Hgb urine dipstick MODERATE (*)    Ketones, ur 15 (*)    Protein, ur 100 (*)    Leukocytes, UA MODERATE (*)    All other components within normal limits  URINE MICROSCOPIC-ADD ON - Abnormal; Notable for the following:    Bacteria, UA FEW (*)    All other components within normal limits  URINE CULTURE   Imaging Review Ct Abdomen Pelvis W Contrast  11/26/2013   CLINICAL DATA:  EMESIS ABDOMINAL PAIN  EXAM: CT ABDOMEN AND PELVIS WITH CONTRAST  TECHNIQUE: Multidetector CT imaging of the abdomen and pelvis was performed using the standard protocol following bolus administration of intravenous contrast.  CONTRAST:  134mL OMNIPAQUE IOHEXOL 300 MG/ML  SOLN  COMPARISON:  None.  FINDINGS: Areas of ill-defined increased density projects within the posterior and lateral  aspects of the left lung base. There is prominence of the interstitial markings.  Evaluation of the left kidney demonstrates moderate hydronephrosis with severe hydroureter. Within the distal left ureter just proximal to the vesicoureteral junction calculi appreciated. The smaller more proximal calculus measures 1 cm in diameter. This distal to this area a second 1.6 cm calculus is appreciated. A 7.5 mm calculus projects within the base of the urinary bladder on the left. A small bladder diverticulum is appreciated within the anterior aspect of the urinary bladder on the left. A small 3.7 mm low attenuating focus projects along the posterior cortical aspect of the kidney. This finding is too small for characterization. Left kidney is otherwise unremarkable.  Evaluation of the right kidney demonstrates minimal pelviectasis without  evidence of hydronephrosis nor hydroureter. Within the distal right ureter just proximal to the vesicoureteral junction a 1.2 cm calculus is appreciated. A 5.7 x 6.1 cm cystic lesion is appreciated within the posterior lateral lower pole of the right kidney. This finding demonstrates Hounsfield units of 15 on immediate contrast imaging and 15 on delayed imaging. There is no definitive enhancing soft tissue component. The walls of the cyst are thickened and demonstrate areas of lobulation. Areas of calcification are also identified within the walls.  A 2.1 cm calculus projects within the central posterior base of the urinary bladder. A small anterior diverticulum is identified within the urinary bladder.  The liver, spleen, adrenals, pancreas are unremarkable.  There is no evidence of bowel obstruction, enteritis, colitis, diverticulitis, nor appendicitis.  There is no evidence of abdominal aortic aneurysm. Atherosclerotic calcifications identified within the aorta. The celiac, SMA, IMA, portal vein, SMV are opacified.  There is no evidence of abdominal or pelvic free fluid, loculated fluid  collections, masses or adenopathy. Diverticulosis is appreciated within the sigmoid colon without evidence of diverticulitis.  There is no evidence of abdominal wall nor inguinal hernia.  Evaluation osseous structures demonstrates no evidence of aggressive appearing osseous lesions.  IMPRESSION: 1. A 1.6 and 1 cm calculus is identified within the distal left ureter with associated moderate to severe obstructive uropathy. 2. Calculi within the urinary bladder 3. A 1.2 cm distal right ureteral calculus without evidence of associated obstructive uropathy 4. Complex appearing cystic mass within the right kidney with indeterminate characteristics, Bosniak IIF surveillance evaluation in 4-6 months with triphasic CT is recommended. 5. Atherosclerotic calcifications are identified within the aorta. 6. Areas of increased density within the lung bases differential considerations are areas of atelectasis versus infiltrate. Correlation with patient's history is recommended and in the absence of clinical signs and symptoms of infection further evaluation with nonemergent dedicated chest CT is recommended. 7. Sigmoid diverticulosis without evidence of diverticulitis.   Electronically Signed   By: Margaree Mackintosh M.D.   On: 11/26/2013 15:55    EKG Interpretation   None       MDM   Final diagnoses:  Ureteral calculus, right    Filed Vitals:   11/26/13 1535  BP: 154/93  Pulse:   Temp:     Afebrile, NAD, non-toxic appearing, AAOx4.   Abdomen soft, tender in LLQ, no distention, guarding, rebound, or rigidity. Leukocytosis noted at 17.2. CT abd/pelv w/ contrast ordered for further evaluation of LLQ abdominal pain. UA returned with many leukocytes and rbcs, and few bacteria. BMP wnl. CT abd/pelv revealed multiple renal calculus, with 1.6cm and 1cm renal ureteral calculus noted at L distal ureter with moderate to severe obstructive uropathy and 1.2 cm right distal ureteral calculus w/o evidence of obstruction. Will  consult urology given leukocytosis, UA results, and several 1+ cm stones.   4:16 PM Discussed with Dr. Tresa Moore who will see patient in ED at [facility] for further management.   Will transfer to [facility] ED for Dr. Tresa Moore. EMTALA filled out. Pain and symptoms managed before transfer. Patient d/w with Dr. Canary Brim, agrees with plan.    Harlow Mares, PA-C 11/26/13 1625

## 2013-11-27 ENCOUNTER — Encounter (HOSPITAL_COMMUNITY): Payer: Self-pay | Admitting: Urology

## 2013-11-27 LAB — URINE CULTURE
Colony Count: 65000
Special Requests: NORMAL

## 2013-11-27 MED ORDER — TRAMADOL HCL 50 MG PO TABS: 50.0000 mg | ORAL_TABLET | Freq: Four times a day (QID) | ORAL | Status: DC | PRN

## 2013-11-27 MED ORDER — CEPHALEXIN 500 MG PO CAPS: 500.0000 mg | ORAL_CAPSULE | Freq: Two times a day (BID) | ORAL | Status: DC

## 2013-11-27 MED ORDER — SENNOSIDES-DOCUSATE SODIUM 8.6-50 MG PO TABS: 1.0000 | ORAL_TABLET | Freq: Two times a day (BID) | ORAL | Status: DC

## 2013-11-27 MED ORDER — TAMSULOSIN HCL 0.4 MG PO CAPS: 0.4000 mg | ORAL_CAPSULE | Freq: Every day | ORAL | Status: DC

## 2013-11-27 NOTE — Op Note (Signed)
NAMETRUST, LEH NO.:  [id number]  MEDICAL RECORD NO.:  [id number]  LOCATION:  0623                         FACILITY:  [facility]  PHYSICIAN:  Alexis Frock, MD     DATE OF BIRTH:  20-Jul-1946  DATE OF PROCEDURE: 11/26/2013 DATE OF DISCHARGE:                              OPERATIVE REPORT   DIAGNOSES: 1. Left greater than right large ureteral stones. 2. Bladder stone. 3. Severe left hydronephrosis with refractory flank pain, nausea,     vomiting.  PROCEDURES: 1. Cystoscopy with bilateral retrograde pyelogram interpretation. 2. Insertion of bilateral ureteral stents, left 6 x 24, right 6 x 26,     no tether. 3. Left first stage ureteroscopic stone manipulation with laser     Lithotripsy. 4. Cystogram with interpretation  ESTIMATED BLOOD LOSS:  Nil.  COMPLICATIONS:  None.  FINDINGS: 1. Bilateral periureteral diverticulum. 2. Massive left hydroureteronephrosis to distal large left ureteral     stones. 3. Large bladder stone. 4. Right unremarkable retrograde pyelogram. 5. Complete impaction of left distal ureteral stone at the level     ureteral orifice necessitating laser fragmentation to obtain wire     access to the left kidney in retrograde fashion. 6. Unremarkable cystogram, no evidence of bladder perforation. 7. Retained enteric contrast and delayed left nephrogram.  INDICATION:  Brian Leon is a pleasant 68 year old gentleman with history of prior nephrolithiasis status post shockwave lithotripsy Several times.  He presented to [facility] ER earlier today with refractory nausea, vomiting, and left flank pain.  He was found on CT imaging to have left greater than right distal ureteral stones, bilateral periureteral diverticula, large bladder stone, severe left hydroureteronephrosis as well as a right cystic renal mass.  He is transferred to the [facility] Emergency Room for urgent urologic consultation and evaluation.  The patient was found to  have refractory flank pain with nausea and emesis.  Options were discussed including bilateral stenting as it means to bilateral renal drainage given that he has bilateral impending obstruction and he wished to proceed.  We mentioned that he would likely require a multi-stage approach to his large volume urolithiasis.  We did mention that we would try to make some progress on his stone burden today if it appeared safe.  Informed consent was obtained and placed in medical record.  PROCEDURE IN DETAIL:  The patient being, Brian Leon, procedure being cystoscopy with bilateral pyelograms, bilateral stents and possible ureteroscopic stone manipulation was confirmed, procedure was carried out.  Time-out was performed.  Intravenous antibiotics were administered.  General LMA anesthesia induced.  The patient was placed into a low lithotomy position.  Sterile field was created prepping the patient's penis, perineum, proximal thighs using iodine x3.  Next, cystourethroscopy was performed using 22-French rigid scope with 12- degree offset lens.  Inspection of the anterior and posterior urethra unremarkable.  Inspection of urinary bladder revealed large bladder stone and bilateral periureteral diverticula.  There was a large crowning stone at the left ureteral orifice.  Attention was directed at the right side.  The right ureteral orifice was cannulated with a 6- French Foley catheter and right retrograde pyelogram obtained.  Right retrograde pyelogram  demonstrated a single right ureter, single system, right kidney.  There was a questionable filling defect in the right distal ureter representing possible right distal ureteral stone. It was very difficult to tell whether this represented a stone in the actual ureter versus a stone in periureteral diverticula.  A 0.038 Glidewire was advanced at the level of the upper pole over which a new 6 x 26 double-J stent was placed.  Good proximal distal curl  was noted. The area of the periureteral diverticulum was carefully inspected and no stone was noted, therefore, suggesting that likely the right filling defect, where the right side of the stone was impacted in the ureter. Attention was directed to the left side.  Attempt was made to cannulate the left ureteral orifice using multiple angulations with an angled tip Glidewire; however, due to distal stone impaction, this was not successful.  As such, holmium laser energy was used with settings of 0.5 joules and 5 hertz fragmenting the distal crowning aspect of the distal stone, thus allowing what appeared to be a visual tract lateral to the stone but within the ureteral lumen.  The 0.038 Glidewire was successfully navigated through this to the level of the upper pole. Notably, the patient had a significant delayed left nephrogram on the right side which corroborated adequate wire placement within the ureter and coiled in the upper pole.  Next, a 6 x 24 double-J stent was placed over this using cystoscopic and fluoroscopic guidance and good proximal distal curl was noted.  There was prompt efflux of contrast run into the distal end of the stent.  There was some contrast material within the peritoneal cavity.  It was very difficult to discern whether this was within the bowel given that the patient was given oral contrast versus this could possibly represent a small bladder perforation.  Very careful visual inspection was performed.  There was no evidence visually of the bladder perforation.  Nevertheless, it was felt that cystogram was warranted to help verify.  As such, 300 mL of Cystografin were instilled via cystoscope and there was normal bladder contour noted.  Normal bladder contour was noted.  There was no evidence of defect of perforation.  Bladder was emptied per cystoscope and again there was no pooling of contrast, this further ruled out any evidence of perforation. As such, the  bladder was emptied per cystoscope and procedure was then terminated.  The patient tolerated the procedure well.  There were no immediate periprocedural complications.  The patient was taken to postanesthesia care unit in stable condition.          ______________________________ Alexis Frock, MD     TM/MEDQ  D:  11/26/2013  T:  11/27/2013  Job:  [id number]

## 2013-11-27 NOTE — Discharge Summary (Signed)
Physician Discharge Summary  Patient ID: Brian Leon MRN: [id number] DOB/AGE: Sep 28, 1946 68 y.o.  Admit date: 11/26/2013 Discharge date: 11/27/2013  Admission Diagnoses: Bilateral Ureteral Stones, Bladder Stones, Hydronephrosis  Discharge Diagnoses:  Bilateral Ureteral Stones, Bladder Stones, Hydronephrosis, Urinary Retention  Discharged Condition: good  Hospital Course:    1 - Bilateral Ureteral Stones with Left Severe Hydronephrosis - CT with left distal ureteral stones (1.2, 1.6cm) and severe hydro, as well as Rt distal stone (no hydro). Cr 0.85. WBC 17k. No fever, tachycardia, or sig bacteruria. Taked to OR 2/214 for urgent cysto, bilateral stents, left first stage ureteroscopic stone manipulation.  2 - Bladder Stone - 2-3 cm bladder stone on imaging and corroborated by cysto 2/24, admits to some modest baseline LUTS.   3 - Bilateral Paraureteral Diverticulae - Incidental bilateral paraureteral diverticulae by ER CT 11/2013.   4 - Rt Cystic Renal Mass - Pt with 5cm Rt lower-mid BIIF renal cyst with thick wall and calcifications incidental on ER CT 11/2013. Will need repeat imaging in about 60mos.  5 - Urinary Retention - Pt developed retention after cysto / ureteroscopy, foley placed with 800cc immediate efflux. Has moderate bilobar prostatic hypertrophy, not on any baseline meds. Started tamsulosin at DC with plan for trial of void in abott 2 days in Urology office.   6 - Disposition - By POD 1, the day of discharge, pt ambulatory, pain controlled, foley working well, and felt to be adequate for discharge.     Consults: None  Significant Diagnostic Studies: radiology: bilateral retrograde pyelograms  Treatments: surgery: 2/214 for urgent cysto, bilateral stents, left first stage ureteroscopic stone manipulation.   Discharge Exam: Blood pressure 140/92, pulse 88, temperature 97.4 F (36.3 C), temperature source Oral, resp. rate 18, height 5\' 10"  (1.778 m), weight 64.819 kg  (142 lb 14.4 oz), SpO2 99.00%. 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 Neck: no adenopathy and thyroid not enlarged, symmetric, no tenderness/mass/nodules Back: symmetric, no curvature. ROM normal. No CVA tenderness. Resp: no labored breathing Cardio: regular rate and rhythm, S1, S2 normal, no murmur, click, rub or gallop GI: soft, non-tender; bowel sounds normal; no masses,  no organomegaly Male genitalia: normal, foley c/d/i with very light pink urine. Extremities: extremities normal, atraumatic, no cyanosis or edema Pulses: 2+ and symmetric Skin: Skin color, texture, turgor normal. No rashes or lesions Lymph nodes: Cervical, supraclavicular, and axillary nodes normal. Neurologic: Grossly normal  Disposition: 01-Home or Self Care     Medication List         albuterol 108 (90 BASE) MCG/ACT inhaler  Commonly known as:  PROVENTIL HFA;VENTOLIN HFA  Inhale 1-2 puffs into the lungs every 6 (six) hours as needed for wheezing or shortness of breath.     cephALEXin 500 MG capsule  Commonly known as:  KEFLEX  Take 1 capsule (500 mg total) by mouth 2 (two) times daily. X 3 days to prevent catheter related infection     cycloSPORINE modified 100 MG capsule  Commonly known as:  NEORAL  Take 100 mg by mouth 2 (two) times daily.     fluocinonide cream 0.05 %  Commonly known as:  LIDEX  Apply 1 application topically 2 (two) times daily.     fluticasone 50 MCG/ACT nasal spray  Commonly known as:  FLONASE  Place 1 spray into both nostrils daily.     Fluticasone-Salmeterol 500-50 MCG/DOSE Aepb  Commonly  known as:  ADVAIR  Inhale 1 puff into the lungs 2 (two) times daily.     leflunomide 10 MG tablet  Commonly known as:  ARAVA  Take 10 mg by mouth daily.     senna-docusate 8.6-50 MG per tablet  Commonly known as:  Senokot-S  Take 1 tablet by mouth 2  (two) times daily. While taking pain meds to prevent constipation     tamsulosin 0.4 MG Caps capsule  Commonly known as:  FLOMAX  Take 1 capsule (0.4 mg total) by mouth daily. For Urination.     traMADol 50 MG tablet  Commonly known as:  ULTRAM  Take 1 tablet (50 mg total) by mouth every 6 (six) hours as needed for moderate pain. Post-operatively           Follow-up Information   Follow up with Alexis Frock, MD. (We will call to arrange catheter removal / nurse visit later this week. We will also contact you to arrange next stage surgery in about 2 weeks.)    Specialty:  Urology   Contact information:   [facility]  [place] [address] [phone]       Signed: Alexis Frock 11/27/2013, 6:52 AM

## 2013-11-27 NOTE — Progress Notes (Addendum)
Pt was complaining of unable to void and pressure on his bladder. 45ml was scanned. Oncall N.P. Called and ordered a catheter. Catheter was inserted aseptically done. 529ml of urine was collected and few small clots was found. Pt. was relieved. Will continue to monitor.

## 2013-11-27 NOTE — Discharge Instructions (Signed)
1 - You may have urinary urgency (bladder spasms) and bloody urine on / off with stent in place. This is normal. ° °2 - Call MD or go to ER for fever >102, severe pain / nausea / vomiting not relieved by medications, or acute change in medical status ° °

## 2013-11-28 NOTE — Anesthesia Postprocedure Evaluation (Signed)
Anesthesia Post Note  Patient: Brian Leon  Procedure(s) Performed: Procedure(s) (LRB): CYSTOSCOPY WITH BILATERAL  RETROGRADE PYELOGRAM  Wyvonnia Dusky BILATERAL STENT PLACEMENT  /CYSTOGRAM / LEFT  URETER1ST STAGE URETEROSCOPY WITH LASER (Bilateral) HOLMIUM LASER APPLICATION (Left)  Anesthesia type: General  Patient location: PACU  Post pain: Pain level controlled  Post assessment: Post-op Vital signs reviewed  Last Vitals:  Filed Vitals:   11/27/13 0540  BP: 140/92  Pulse: 88  Temp: 36.3 C  Resp: 18    Post vital signs: Reviewed  Level of consciousness: sedated  Complications: No apparent anesthesia complications

## 2013-11-29 DIAGNOSIS — R339 Retention of urine, unspecified: Secondary | ICD-10-CM | POA: Diagnosis not present

## 2013-11-29 DIAGNOSIS — R82998 Other abnormal findings in urine: Secondary | ICD-10-CM | POA: Diagnosis not present

## 2013-11-29 DIAGNOSIS — N200 Calculus of kidney: Secondary | ICD-10-CM | POA: Diagnosis not present

## 2013-11-29 DIAGNOSIS — R350 Frequency of micturition: Secondary | ICD-10-CM | POA: Diagnosis not present

## 2013-12-02 ENCOUNTER — Other Ambulatory Visit: Payer: Self-pay | Admitting: Urology

## 2013-12-12 ENCOUNTER — Encounter (HOSPITAL_BASED_OUTPATIENT_CLINIC_OR_DEPARTMENT_OTHER): Payer: Self-pay | Admitting: *Deleted

## 2013-12-13 ENCOUNTER — Encounter (HOSPITAL_BASED_OUTPATIENT_CLINIC_OR_DEPARTMENT_OTHER): Payer: Self-pay | Admitting: *Deleted

## 2013-12-16 ENCOUNTER — Encounter (HOSPITAL_BASED_OUTPATIENT_CLINIC_OR_DEPARTMENT_OTHER): Payer: Self-pay | Admitting: *Deleted

## 2013-12-16 NOTE — Progress Notes (Signed)
NPO AFTER MN. ARRIVE AT 0700. NEEDS ISTAT 8.  WILL DO ADVAIR AM DOS AND MAY TAKE TRAMADOL IF NEEDED W/ SIPS OF WATER.

## 2013-12-17 NOTE — Anesthesia Preprocedure Evaluation (Addendum)
Anesthesia Evaluation  Patient identified by MRN, date of birth, ID band Patient awake    Reviewed: Allergy & Precautions, H&P , NPO status , Patient's Chart, lab work & pertinent test results  Airway Mallampati: II TM Distance: >3 FB Neck ROM: full    Dental  (+) Caps, Dental Advisory Given Bridge upper front:   Pulmonary asthma , COPDformer smoker,  PFT 2009 moderate to severe with good reversibility breath sounds clear to auscultation  Pulmonary exam normal       Cardiovascular hypertension, Rhythm:regular Rate:Normal     Neuro/Psych Cervical fusion negative neurological ROS  negative psych ROS   GI/Hepatic negative GI ROS, Neg liver ROS,   Endo/Other  negative endocrine ROS  Renal/GU negative Renal ROS  negative genitourinary   Musculoskeletal   Abdominal   Peds  Hematology negative hematology ROS (+)   Anesthesia Other Findings   Reproductive/Obstetrics negative OB ROS                          Anesthesia Physical Anesthesia Plan  ASA: III  Anesthesia Plan: General   Post-op Pain Management:    Induction: Intravenous  Airway Management Planned: LMA  Additional Equipment:   Intra-op Plan:   Post-operative Plan:   Informed Consent: I have reviewed the patients History and Physical, chart, labs and discussed the procedure including the risks, benefits and alternatives for the proposed anesthesia with the patient or authorized representative who has indicated his/her understanding and acceptance.   Dental Advisory Given  Plan Discussed with: CRNA and Surgeon  Anesthesia Plan Comments:         Anesthesia Quick Evaluation

## 2013-12-18 ENCOUNTER — Ambulatory Visit (HOSPITAL_BASED_OUTPATIENT_CLINIC_OR_DEPARTMENT_OTHER): Payer: Medicare Other | Admitting: Anesthesiology

## 2013-12-18 ENCOUNTER — Encounter (HOSPITAL_BASED_OUTPATIENT_CLINIC_OR_DEPARTMENT_OTHER): Payer: Self-pay | Admitting: *Deleted

## 2013-12-18 ENCOUNTER — Encounter (HOSPITAL_BASED_OUTPATIENT_CLINIC_OR_DEPARTMENT_OTHER): Payer: Medicare Other | Admitting: Anesthesiology

## 2013-12-18 ENCOUNTER — Encounter (HOSPITAL_COMMUNITY): Payer: Self-pay | Admitting: Emergency Medicine

## 2013-12-18 ENCOUNTER — Ambulatory Visit (HOSPITAL_BASED_OUTPATIENT_CLINIC_OR_DEPARTMENT_OTHER)
Admission: RE | Admit: 2013-12-18 | Discharge: 2013-12-18 | Disposition: A | Discharge status: Home / Self Care | Payer: Medicare Other | Source: Ambulatory Visit | Attending: Urology | Admitting: Urology

## 2013-12-18 ENCOUNTER — Emergency Department (HOSPITAL_COMMUNITY)
Admission: EM | Admit: 2013-12-18 | Discharge: 2013-12-18 | Disposition: A | Discharge status: Home / Self Care | Payer: Medicare Other | Attending: Emergency Medicine | Admitting: Emergency Medicine

## 2013-12-18 ENCOUNTER — Encounter (HOSPITAL_BASED_OUTPATIENT_CLINIC_OR_DEPARTMENT_OTHER)
Admission: RE | Disposition: A | Discharge status: Home / Self Care | Payer: Self-pay | Source: Ambulatory Visit | Attending: Urology

## 2013-12-18 DIAGNOSIS — L408 Other psoriasis: Secondary | ICD-10-CM | POA: Insufficient documentation

## 2013-12-18 DIAGNOSIS — Z79899 Other long term (current) drug therapy: Secondary | ICD-10-CM | POA: Insufficient documentation

## 2013-12-18 DIAGNOSIS — M129 Arthropathy, unspecified: Secondary | ICD-10-CM | POA: Insufficient documentation

## 2013-12-18 DIAGNOSIS — J4489 Other specified chronic obstructive pulmonary disease: Secondary | ICD-10-CM | POA: Insufficient documentation

## 2013-12-18 DIAGNOSIS — J449 Chronic obstructive pulmonary disease, unspecified: Secondary | ICD-10-CM | POA: Insufficient documentation

## 2013-12-18 DIAGNOSIS — I10 Essential (primary) hypertension: Secondary | ICD-10-CM | POA: Insufficient documentation

## 2013-12-18 DIAGNOSIS — Q619 Cystic kidney disease, unspecified: Secondary | ICD-10-CM | POA: Insufficient documentation

## 2013-12-18 DIAGNOSIS — N1330 Unspecified hydronephrosis: Secondary | ICD-10-CM | POA: Diagnosis not present

## 2013-12-18 DIAGNOSIS — N323 Diverticulum of bladder: Secondary | ICD-10-CM | POA: Insufficient documentation

## 2013-12-18 DIAGNOSIS — M810 Age-related osteoporosis without current pathological fracture: Secondary | ICD-10-CM | POA: Diagnosis not present

## 2013-12-18 DIAGNOSIS — N289 Disorder of kidney and ureter, unspecified: Secondary | ICD-10-CM | POA: Diagnosis not present

## 2013-12-18 DIAGNOSIS — N201 Calculus of ureter: Secondary | ICD-10-CM | POA: Diagnosis not present

## 2013-12-18 DIAGNOSIS — R339 Retention of urine, unspecified: Secondary | ICD-10-CM | POA: Diagnosis not present

## 2013-12-18 DIAGNOSIS — Z87891 Personal history of nicotine dependence: Secondary | ICD-10-CM | POA: Diagnosis not present

## 2013-12-18 DIAGNOSIS — Z981 Arthrodesis status: Secondary | ICD-10-CM | POA: Insufficient documentation

## 2013-12-18 DIAGNOSIS — N210 Calculus in bladder: Secondary | ICD-10-CM | POA: Diagnosis not present

## 2013-12-18 DIAGNOSIS — Z872 Personal history of diseases of the skin and subcutaneous tissue: Secondary | ICD-10-CM | POA: Insufficient documentation

## 2013-12-18 DIAGNOSIS — IMO0002 Reserved for concepts with insufficient information to code with codable children: Secondary | ICD-10-CM | POA: Insufficient documentation

## 2013-12-18 DIAGNOSIS — Q649 Congenital malformation of urinary system, unspecified: Secondary | ICD-10-CM | POA: Insufficient documentation

## 2013-12-18 DIAGNOSIS — N200 Calculus of kidney: Secondary | ICD-10-CM | POA: Diagnosis not present

## 2013-12-18 DIAGNOSIS — Z87448 Personal history of other diseases of urinary system: Secondary | ICD-10-CM | POA: Insufficient documentation

## 2013-12-18 HISTORY — DX: Congenital diverticulum of bladder: Q64.6

## 2013-12-18 HISTORY — DX: Cyst of kidney, acquired: N28.1

## 2013-12-18 HISTORY — PX: CYSTOSCOPY WITH LITHOLAPAXY: SHX1425

## 2013-12-18 HISTORY — DX: Allergic rhinitis, unspecified: J30.9

## 2013-12-18 HISTORY — PX: CYSTOSCOPY WITH RETROGRADE PYELOGRAM, URETEROSCOPY AND STENT PLACEMENT: SHX5789

## 2013-12-18 HISTORY — DX: Calculus of ureter: N20.1

## 2013-12-18 HISTORY — DX: Blindness, one eye, unspecified eye: H54.40

## 2013-12-18 HISTORY — PX: HOLMIUM LASER APPLICATION: SHX5852

## 2013-12-18 HISTORY — DX: Unspecified osteoarthritis, unspecified site: M19.90

## 2013-12-18 LAB — POCT I-STAT, CHEM 8
BUN: 10 mg/dL (ref 6–23)
Calcium, Ion: 1.11 mmol/L — ABNORMAL LOW (ref 1.13–1.30)
Chloride: 100 mEq/L (ref 96–112)
Creatinine, Ser: 1.2 mg/dL (ref 0.50–1.35)
Glucose, Bld: 82 mg/dL (ref 70–99)
HCT: 38 % — ABNORMAL LOW (ref 39.0–52.0)
Hemoglobin: 12.9 g/dL — ABNORMAL LOW (ref 13.0–17.0)
Potassium: 4.4 mEq/L (ref 3.7–5.3)
Sodium: 136 mEq/L — ABNORMAL LOW (ref 137–147)
TCO2: 28 mmol/L (ref 0–100)

## 2013-12-18 LAB — URINALYSIS, ROUTINE W REFLEX MICROSCOPIC
Bilirubin Urine: NEGATIVE
Glucose, UA: NEGATIVE mg/dL
Ketones, ur: NEGATIVE mg/dL
Nitrite: NEGATIVE
Protein, ur: 100 mg/dL — AB
Specific Gravity, Urine: 1.007 (ref 1.005–1.030)
Urobilinogen, UA: 0.2 mg/dL (ref 0.0–1.0)
pH: 7 (ref 5.0–8.0)

## 2013-12-18 LAB — BASIC METABOLIC PANEL
BUN: 11 mg/dL (ref 6–23)
CO2: 22 mEq/L (ref 19–32)
Calcium: 9.1 mg/dL (ref 8.4–10.5)
Chloride: 97 mEq/L (ref 96–112)
Creatinine, Ser: 1.12 mg/dL (ref 0.50–1.35)
GFR calc Af Amer: 76 mL/min — ABNORMAL LOW (ref >90)
GFR calc non Af Amer: 65 mL/min — ABNORMAL LOW (ref >90)
Glucose, Bld: 148 mg/dL — ABNORMAL HIGH (ref 70–99)
Potassium: 4.4 mEq/L (ref 3.7–5.3)
Sodium: 134 mEq/L — ABNORMAL LOW (ref 137–147)

## 2013-12-18 LAB — URINE MICROSCOPIC-ADD ON

## 2013-12-18 SURGERY — CYSTOSCOPY, WITH BLADDER CALCULUS LITHOLAPAXY
Anesthesia: General | Site: Ureter

## 2013-12-18 MED ORDER — LACTATED RINGERS IV SOLN
INTRAVENOUS | Status: DC
  Filled 2013-12-18: qty 1000

## 2013-12-18 MED ORDER — DEXAMETHASONE SODIUM PHOSPHATE 4 MG/ML IJ SOLN
INTRAMUSCULAR | Status: DC | PRN
  Administered 2013-12-18: 8 mg via INTRAVENOUS

## 2013-12-18 MED ORDER — SODIUM CHLORIDE 0.9 % IR SOLN
Status: DC | PRN
  Administered 2013-12-18: 13000 mL

## 2013-12-18 MED ORDER — EPHEDRINE SULFATE 50 MG/ML IJ SOLN
INTRAMUSCULAR | Status: DC | PRN
  Administered 2013-12-18 (×4): 10 mg via INTRAVENOUS

## 2013-12-18 MED ORDER — TRAMADOL HCL 50 MG PO TABS
ORAL_TABLET | ORAL | Status: AC
  Filled 2013-12-18: qty 1

## 2013-12-18 MED ORDER — MIDAZOLAM HCL 5 MG/5ML IJ SOLN
INTRAMUSCULAR | Status: DC | PRN
  Administered 2013-12-18 (×2): 1 mg via INTRAVENOUS

## 2013-12-18 MED ORDER — KETOROLAC TROMETHAMINE 30 MG/ML IJ SOLN
INTRAMUSCULAR | Status: AC
  Filled 2013-12-18: qty 1

## 2013-12-18 MED ORDER — TAMSULOSIN HCL 0.4 MG PO CAPS
ORAL_CAPSULE | ORAL | Status: AC
  Filled 2013-12-18: qty 1

## 2013-12-18 MED ORDER — TAMSULOSIN HCL 0.4 MG PO CAPS
0.4000 mg | ORAL_CAPSULE | Freq: Every day | ORAL | Status: DC
  Administered 2013-12-18: 0.4 mg via ORAL
  Filled 2013-12-18: qty 1

## 2013-12-18 MED ORDER — GLYCOPYRROLATE 0.2 MG/ML IJ SOLN
INTRAMUSCULAR | Status: DC | PRN
  Administered 2013-12-18: 0.2 mg via INTRAVENOUS

## 2013-12-18 MED ORDER — FENTANYL CITRATE 0.05 MG/ML IJ SOLN
INTRAMUSCULAR | Status: AC
  Filled 2013-12-18: qty 6

## 2013-12-18 MED ORDER — URELLE 81 MG PO TABS
ORAL_TABLET | ORAL | Status: AC
  Filled 2013-12-18: qty 1

## 2013-12-18 MED ORDER — ONDANSETRON HCL 4 MG/2ML IJ SOLN
INTRAMUSCULAR | Status: DC | PRN
  Administered 2013-12-18: 4 mg via INTRAVENOUS

## 2013-12-18 MED ORDER — MIDAZOLAM HCL 2 MG/2ML IJ SOLN
INTRAMUSCULAR | Status: AC
  Filled 2013-12-18: qty 2

## 2013-12-18 MED ORDER — PROPOFOL 10 MG/ML IV BOLUS
INTRAVENOUS | Status: DC | PRN
  Administered 2013-12-18: 160 mg via INTRAVENOUS

## 2013-12-18 MED ORDER — TRAMADOL HCL 50 MG PO TABS: 50.0000 mg | ORAL_TABLET | Freq: Four times a day (QID) | ORAL | Status: DC | PRN

## 2013-12-18 MED ORDER — LACTATED RINGERS IV SOLN
INTRAVENOUS | Status: DC
  Administered 2013-12-18: 07:00:00 via INTRAVENOUS
  Filled 2013-12-18: qty 1000

## 2013-12-18 MED ORDER — KETOROLAC TROMETHAMINE 30 MG/ML IJ SOLN
INTRAMUSCULAR | Status: DC | PRN
  Administered 2013-12-18: 30 mg via INTRAVENOUS

## 2013-12-18 MED ORDER — TAMSULOSIN HCL 0.4 MG PO CAPS: 0.4000 mg | ORAL_CAPSULE | Freq: Every day | ORAL | Status: DC

## 2013-12-18 MED ORDER — IOHEXOL 300 MG/ML  SOLN
INTRAMUSCULAR | Status: DC | PRN
  Administered 2013-12-18: 10 mL

## 2013-12-18 MED ORDER — URELLE 81 MG PO TABS
1.0000 | ORAL_TABLET | Freq: Four times a day (QID) | ORAL | Status: DC
  Administered 2013-12-18: 81 mg via ORAL
  Filled 2013-12-18: qty 1

## 2013-12-18 MED ORDER — LACTATED RINGERS IV SOLN
INTRAVENOUS | Status: DC | PRN
  Administered 2013-12-18 (×3): via INTRAVENOUS

## 2013-12-18 MED ORDER — GENTAMICIN IN SALINE 1.6-0.9 MG/ML-% IV SOLN
80.0000 mg | INTRAVENOUS | Status: DC
  Filled 2013-12-18: qty 50

## 2013-12-18 MED ORDER — TRAMADOL HCL 50 MG PO TABS
50.0000 mg | ORAL_TABLET | Freq: Four times a day (QID) | ORAL | Status: DC | PRN
  Administered 2013-12-18 (×2): 50 mg via ORAL
  Filled 2013-12-18: qty 1

## 2013-12-18 MED ORDER — GENTAMICIN SULFATE 40 MG/ML IJ SOLN
92.2500 mg | INTRAVENOUS | Status: DC | PRN
  Administered 2013-12-18: 310 mg via INTRAVENOUS

## 2013-12-18 MED ORDER — FENTANYL CITRATE 0.05 MG/ML IJ SOLN
25.0000 ug | INTRAMUSCULAR | Status: DC | PRN
  Administered 2013-12-18: 25 ug via INTRAVENOUS
  Filled 2013-12-18: qty 1

## 2013-12-18 MED ORDER — FENTANYL CITRATE 0.05 MG/ML IJ SOLN
INTRAMUSCULAR | Status: DC | PRN
  Administered 2013-12-18: 25 ug via INTRAVENOUS
  Administered 2013-12-18: 50 ug via INTRAVENOUS
  Administered 2013-12-18 (×5): 25 ug via INTRAVENOUS

## 2013-12-18 MED ORDER — GENTAMICIN SULFATE 40 MG/ML IJ SOLN
5.0000 mg/kg | Freq: Once | INTRAMUSCULAR | Status: DC
  Filled 2013-12-18: qty 7.75

## 2013-12-18 MED ORDER — LIDOCAINE HCL (CARDIAC) 20 MG/ML IV SOLN
INTRAVENOUS | Status: DC | PRN
  Administered 2013-12-18: 80 mg via INTRAVENOUS

## 2013-12-18 MED ORDER — BELLADONNA ALKALOIDS-OPIUM 16.2-60 MG RE SUPP
RECTAL | Status: AC
  Filled 2013-12-18: qty 1

## 2013-12-18 MED ORDER — FENTANYL CITRATE 0.05 MG/ML IJ SOLN
INTRAMUSCULAR | Status: AC
  Filled 2013-12-18: qty 2

## 2013-12-18 SURGICAL SUPPLY — 46 items
BAG DRAIN URO-CYSTO SKYTR STRL (DRAIN) ×4 IMPLANT
BASKET LASER NITINOL 1.9FR (BASKET) ×8 IMPLANT
BASKET STNLS GEMINI 4WIRE 3FR (BASKET) IMPLANT
BASKET ZERO TIP NITINOL 2.4FR (BASKET) IMPLANT
CANISTER SUCT LVC 12 LTR MEDI- (MISCELLANEOUS) ×8 IMPLANT
CATH INTERMIT  6FR 70CM (CATHETERS) ×4 IMPLANT
CATH URET 5FR 28IN CONE TIP (BALLOONS)
CATH URET 5FR 28IN OPEN ENDED (CATHETERS) IMPLANT
CATH URET 5FR 70CM CONE TIP (BALLOONS) IMPLANT
CLOTH BEACON ORANGE TIMEOUT ST (SAFETY) ×4 IMPLANT
DRAPE CAMERA CLOSED 9X96 (DRAPES) ×4 IMPLANT
ELECT REM PT RETURN 9FT ADLT (ELECTROSURGICAL)
ELECTRODE REM PT RTRN 9FT ADLT (ELECTROSURGICAL) IMPLANT
FIBER LASER FLEXIVA 200 (UROLOGICAL SUPPLIES) IMPLANT
FIBER LASER FLEXIVA 365 (UROLOGICAL SUPPLIES) ×4 IMPLANT
GLOVE BIO SURGEON STRL SZ7.5 (GLOVE) ×4 IMPLANT
GLOVE BIOGEL M 6.5 STRL (GLOVE) ×4 IMPLANT
GLOVE BIOGEL PI IND STRL 6.5 (GLOVE) ×4 IMPLANT
GLOVE BIOGEL PI INDICATOR 6.5 (GLOVE) ×4
GOWN PREVENTION PLUS LG XLONG (DISPOSABLE) IMPLANT
GOWN STRL REIN XL XLG (GOWN DISPOSABLE) IMPLANT
GOWN STRL REUS W/TWL LRG LVL3 (GOWN DISPOSABLE) ×4 IMPLANT
GOWN STRL REUS W/TWL XL LVL3 (GOWN DISPOSABLE) ×4 IMPLANT
GUIDEWIRE 0.038 PTFE COATED (WIRE) IMPLANT
GUIDEWIRE ANG ZIPWIRE 038X150 (WIRE) ×4 IMPLANT
GUIDEWIRE STR DUAL SENSOR (WIRE) ×4 IMPLANT
IV NS 1000ML (IV SOLUTION) ×26
IV NS 1000ML BAXH (IV SOLUTION) ×26 IMPLANT
IV NS IRRIG 3000ML ARTHROMATIC (IV SOLUTION) IMPLANT
KIT BALLIN UROMAX 15FX10 (LABEL) IMPLANT
KIT BALLN UROMAX 15FX4 (MISCELLANEOUS) IMPLANT
KIT BALLN UROMAX 26 75X4 (MISCELLANEOUS)
PACK CYSTOSCOPY (CUSTOM PROCEDURE TRAY) ×4 IMPLANT
SET HIGH PRES BAL DIL (LABEL)
SET IRRIG Y TYPE TUR BLADDER L (SET/KITS/TRAYS/PACK) ×4 IMPLANT
SHEATH ACCESS URETERAL 38CM (SHEATH) ×4 IMPLANT
SHEATH URET ACCESS 12FR/35CM (UROLOGICAL SUPPLIES) IMPLANT
SHEATH URET ACCESS 12FR/55CM (UROLOGICAL SUPPLIES) IMPLANT
STENT 6X24 (STENTS) IMPLANT
STENT 6X26 (STENTS) IMPLANT
STENT URET 6FRX24 CONTOUR (STENTS) ×4 IMPLANT
STENT URET 6FRX26 CONTOUR (STENTS) ×4 IMPLANT
SYR 20CC LL (SYRINGE) ×4 IMPLANT
SYRINGE 10CC LL (SYRINGE) ×4 IMPLANT
SYRINGE IRR TOOMEY STRL 70CC (SYRINGE) ×4 IMPLANT
TUBE FEEDING 8FR 16IN STR KANG (MISCELLANEOUS) ×4 IMPLANT

## 2013-12-18 NOTE — Anesthesia Procedure Notes (Addendum)
Procedure Name: LMA Insertion Date/Time: 12/18/2013 8:33 AM Performed by: Justice Rocher Pre-anesthesia Checklist: Patient identified, Emergency Drugs available, Suction available and Patient being monitored Patient Re-evaluated:Patient Re-evaluated prior to inductionOxygen Delivery Method: Circle System Utilized Preoxygenation: Pre-oxygenation with 100% oxygen Intubation Type: IV induction Ventilation: Mask ventilation without difficulty LMA: LMA inserted LMA Size: 4.0 Number of attempts: 1 Airway Equipment and Method: bite block Placement Confirmation: positive ETCO2 Tube secured with: Tape Dental Injury: Teeth and Oropharynx as per pre-operative assessment  Comments: Frontal dental bridge / caps all intact with placement of LMA carefully. No complications. Taped BBS equal + ETCO2. - LB

## 2013-12-18 NOTE — ED Notes (Signed)
Pt reports urinary retention occurred before with prior stent placement. Foley catheter in place and patient would like to go.

## 2013-12-18 NOTE — Progress Notes (Signed)
Voided x 2, 30 ml and the 50 ml pink clear urine, Dr. Tresa Moore paged and okayed to be discharged home.

## 2013-12-18 NOTE — Discharge Instructions (Signed)
Return to the ED with any concerns including decreased urine in foley bag, fever/chills, abdominal pain, vomiting, decreased level of alertness/lethargy, or any other alarming symptoms

## 2013-12-18 NOTE — ED Notes (Signed)
Pt c/o urinary retention since 0800 this morning.  Had a surgery this morning for kidney stones.

## 2013-12-18 NOTE — Brief Op Note (Signed)
12/18/2013  10:28 AM  PATIENT:  Brian Leon  68 y.o. male  PRE-OPERATIVE DIAGNOSIS:  BILATERAL URETERAL STONES, BLADDER STONE  POST-OPERATIVE DIAGNOSIS:  BILATERAL URETERAL STONES, BLADDER STONE  PROCEDURE:  Procedure(s): CYSTOSCOPY WITH LITHOLAPAXY BLADDER STONE/ SECOND STAGE (N/A) CYSTOSCOPY WITH RETROGRADE PYELOGRAM, URETEROSCOPY AND STENT EXCHANGE/ SECOND STAGE (Bilateral) HOLMIUM LASER APPLICATION (Bilateral)  SURGEON:  Surgeon(s) and Role:    * Alexis Frock, MD - Primary  PHYSICIAN ASSISTANT:   ASSISTANTS: none   ANESTHESIA:   general  EBL:  Total I/O In: 1000 [I.V.:1000] Out: -   BLOOD ADMINISTERED:none  DRAINS: none   LOCAL MEDICATIONS USED:  NONE  SPECIMEN:  Source of Specimen:  1 - Left Ureteral Stone, 2 - Bladder Stone  DISPOSITION OF SPECIMEN:  [facility] for compositional analysis  COUNTS:  YES  TOURNIQUET:  * No tourniquets in log *  DICTATION: .Other Dictation: Dictation Number [id number]  PLAN OF CARE: Discharge to home after PACU  PATIENT DISPOSITION:  PACU - hemodynamically stable.   Delay start of Pharmacological VTE agent (>24hrs) due to surgical blood loss or risk of bleeding: not applicable

## 2013-12-18 NOTE — Anesthesia Postprocedure Evaluation (Signed)
  Anesthesia Post-op Note  Patient: Brian Leon  Procedure(s) Performed: Procedure(s) (LRB): CYSTOSCOPY WITH LITHOLAPAXY BLADDER STONE/ SECOND STAGE (N/A) CYSTOSCOPY WITH RETROGRADE PYELOGRAM, URETEROSCOPY AND STENT EXCHANGE/ SECOND STAGE (Bilateral) HOLMIUM LASER APPLICATION (Bilateral)  Patient Location: PACU  Anesthesia Type: General  Level of Consciousness: awake and alert   Airway and Oxygen Therapy: Patient Spontanous Breathing  Post-op Pain: mild  Post-op Assessment: Post-op Vital signs reviewed, Patient's Cardiovascular Status Stable, Respiratory Function Stable, Patent Airway and No signs of Nausea or vomiting  Last Vitals:  Filed Vitals:   12/18/13 1130  BP: 147/77  Pulse: 79  Temp:   Resp: 15    Post-op Vital Signs: stable   Complications: No apparent anesthesia complications
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
## 2013-12-18 NOTE — Transfer of Care (Signed)
Immediate Anesthesia Transfer of Care Note  Patient: Brian Leon  Procedure(s) Performed: Procedure(s) (LRB): CYSTOSCOPY WITH LITHOLAPAXY BLADDER STONE/ SECOND STAGE (N/A) CYSTOSCOPY WITH RETROGRADE PYELOGRAM, URETEROSCOPY AND STENT EXCHANGE/ SECOND STAGE (Bilateral) HOLMIUM LASER APPLICATION (Bilateral)  Patient Location: PACU  Anesthesia Type: General  Level of Consciousness: awake, sedated, patient cooperative and responds to stimulation  Airway & Oxygen Therapy: Patient Spontanous Breathing and Patient connected to face mask oxygen  Post-op Assessment: Report given to PACU RN, Post -op Vital signs reviewed and stable and Patient moving all extremities  Post vital signs: Reviewed and stable  Complications: No apparent anesthesia complications

## 2013-12-18 NOTE — Discharge Instructions (Signed)
1 - You may have urinary urgency (bladder spasms) and bloody urine on / off with stent in place. This is normal. ° °2 - Call MD or go to ER for fever >102, severe pain / nausea / vomiting not relieved by medications, or acute change in medical status °[facility] °[phone] °Post Ureteroscopy With or Without Stent Instructions ° °Definitions: ° °Ureter: The duct that transports urine from the kidney to the bladder. °Stent:   A plastic hollow tube that is placed into the ureter, from the kidney to the                 bladder to prevent the ureter from swelling shut. ° °GENERAL INSTRUCTIONS: ° °Despite the fact that no skin incisions were used, the area around the ureter and bladder is raw and irritated. The stent is a foreign body which will further irritate the bladder wall. This irritation is manifested by increased frequency of urination, both day and night, and by an increase in the urge to urinate. In some, the urge to urinate is present almost always. Sometimes the urge is strong enough that you may not be able to stop yourself from urinating. The only real cure is to remove the stent and then give time for the bladder wall to heal which can't be done until the danger of the ureter swelling shut has passed, which varies. ° °You may see some blood in your urine while the stent is in place and a few days afterwards. Do not be alarmed, even if the urine was clear for a while. Get off your feet and drink lots of fluids until clearing occurs. If you start to pass clots or don't improve, call us. ° °DIET: °You may return to your normal diet immediately. Because of the raw surface of your bladder, alcohol, spicy foods, acid type foods and drinks with caffeine may cause irritation or frequency and should be used in moderation. To keep your urine flowing freely and to avoid constipation, drink plenty of fluids during the day ( 8-10 glasses ). °Tip: Avoid cranberry juice because it is very  acidic. ° °ACTIVITY: °Your physical activity doesn't need to be restricted. However, if you are very active, you may see some blood in your urine. We suggest that you reduce your activity under these circumstances until the bleeding has stopped. ° °BOWELS: °It is important to keep your bowels regular during the postoperative period. Straining with bowel movements can cause bleeding. A bowel movement every other day is reasonable. Use a mild laxative if needed, such as Milk of Magnesia 2-3 tablespoons, or 2 Dulcolax tablets. Call if you continue to have problems. If you have been taking narcotics for pain, before, during or after your surgery, you may be constipated. Take a laxative if necessary. ° ° °MEDICATION: °You should resume your pre-surgery medications unless told not to. In addition you will often be given an antibiotic to prevent infection. These should be taken as prescribed until the bottles are finished unless you are having an unusual reaction to one of the drugs. ° °PROBLEMS YOU SHOULD REPORT TO US: °· Fevers over 100.5 Fahrenheit. °· Heavy bleeding, or clots ( See above notes about blood in urine ). °· Inability to urinate. °· Drug reactions ( hives, rash, nausea, vomiting, diarrhea ). °· Severe burning or pain with urination that is not improving. ° °FOLLOW-UP: °You will need a follow-up appointment to monitor your progress. Call for this appointment at the number listed above.   Usually the first appointment will be about three to fourteen days after your surgery.      Post Anesthesia Home Care Instructions  Activity: Get plenty of rest for the remainder of the day. A responsible adult should stay with you for 24 hours following the procedure.  For the next 24 hours, DO NOT: -Drive a car -Paediatric nurse -Drink alcoholic beverages -Take any medication unless instructed by your physician -Make any legal decisions or sign important papers.  Meals: Start with liquid foods such as  gelatin or soup. Progress to regular foods as tolerated. Avoid greasy, spicy, heavy foods. If nausea and/or vomiting occur, drink only clear liquids until the nausea and/or vomiting subsides. Call your physician if vomiting continues.  Special Instructions/Symptoms: Your throat may feel dry or sore from the anesthesia or the breathing tube placed in your throat during surgery. If this causes discomfort, gargle with warm salt water. The discomfort should disappear within 24 hours.

## 2013-12-18 NOTE — ED Notes (Signed)
Pt refused VS  

## 2013-12-19 LAB — URINE CULTURE

## 2013-12-19 NOTE — Op Note (Deleted)
NAMEGREGG, WINCHELL NO.:  [id number]  MEDICAL RECORD NO.:  [id number]  LOCATION:  5409                         FACILITY:  [facility]  PHYSICIAN:  Alexis Frock, MD     DATE OF BIRTH:  1945-10-18  DATE OF PROCEDURE: 12/18/2013 DATE OF DISCHARGE:                                OPERATIVE REPORT   PREOPERATIVE DIAGNOSES: 1. Bilateral large ureteral stones. 2. Large bladder stone.  POSTOPERATIVE DIAGNOSES: 1. Bilateral large ureteral stones. 2. Large bladder stone. 3. Large bilateral periureteral diverticulum.  PROCEDURE: 1. Cystoscopy with bilateral retrograde pyelogram interpretation. 2. Exchange of bilateral ureteral stents, left 6 x 26 double-J, right 6x24 JJ. 3. Bilateral second-stage ureteroscopy with laser lithotripsy. 4. Cystolithalopexy stone >2.5cm.  ESTIMATED BLOOD LOSS:  Nil.  COMPLICATIONS:  None.  SPECIMENS: 1. Left ureteral stone fragment for compositional analysis. 2. Bladder stone fragments.  FINDINGS: 1 - Large bladder stone, estimated 4cm 2 - Left > Right Ureteral Stones 3 - Bilateral large stones in paraureteral diverticulae with very narrow neck, only accessible via ureteroscopic technique.  INDICATIONS:  Mr. Nicoson is a pleasant 68 year old gentleman with history of recurrent nephrolithiasis.  He underwent urgent cystoscopy with bilateral stenting in first stage left ureteroscopic stone manipulation on November 26, 2013, for bilateral ureteral stones and refractory pain.  He also had a very large bladder stone as well.  He presents today for second-stage procedure.  We discussed previously that he very well may require multistage approach including even third stage giving and is very large volume of stones.  Informed consent was obtained and placed in medical record.  PROCEDURE IN DETAIL:  The patient being, Erick Stranahan, procedure being bilateral ureteroscopic stimulation and cystolitholapaxy was confirmed. Procedure was carried  out.  Time-out was performed.  Intravenous antibiotics were administered.  General LMA anesthesia was introduced. The patient was into a low lithotomy position.  Sterile field was created, prepping the patient's penis, perineum, proximal thighs using iodine x3.  Next, cystourethroscopy was performed using a 22-French rigid scope with 12-degree offset lens.  Inspection of the bladder revealed a large dominant bladder stone posterior inferior bladder. Distal end of bilateral stents were seen in situ.  The distal on the left stent was grasped, brought to the level of the urethral meatus through which a 0.03 Glidewire was advanced to the level of the upper pole.  It was exchanged for a 6-French Foley catheter.  Left retrograde pyelogram demonstrated  left ureter, single system, left kidney.  There was a very large tortuosity and dilation consistent with known chronic left hydronephrosis, large filling defect in distal ureter consistent with large volume distal stone.  The zip wire was once again advanced, set aside as a safety wire.  Next, semi-rigid ureteroscopy was on the distal left ureter alongside a separate Sensor working wire and distal ureter the very large stone in question was indeed found. Holmium laser energy applied to the stone using settings of 0.5 joules and 10 hertz fragmenting into pieces approximately 4 mm or less in diameter.  These were sequentially grasped and brought to the level of the urinary bladder.  A separate component was then brought out its entirety and  set aside for compositional analysis.  Semi-rigid ureteroscopy was then performed at the distal 2/3 left ureter nodes with additional calcifications or mucosal abnormalities were found.  Again the left ureter was quite tortuous and dilated consistent with chronic hydronephrosis to verify no residual left renal stone fragments.  The semi-rigid ureteroscope was exchanged for the 38-cm 12/14 ureteral access sheath  over the sensor working wire to level the proximal ureter and flexible digital ureteroscopy was performed of the left kidney. Systematic inspection revealed complete resolution of all stone fragments.  There again were chronic changes of left kidney consistent with chronic hydronephrosis.  There was some proteinaceous debris, but no residual stones found on this location.  The access sheath was removed under continuous ureteroscopic vision, no mucosal abnormalities were found.  A new 6 x 24 stent was placed on the left side using cystoscopic and fluoroscopic guidance.  Attention was then directed at the cystolitholapaxy.  Using the [employer] 26-French continuous flow resectoscope sheath and a 365 nanometer laser fiber, holmium laser energy was applied to the large dominant bladder stone, the settings of 4 joules and 5 hertz fragmenting the stone into pieces approximately 6-8 mm in diameter or less.  It was then irrigated via the resectoscope sheath and set aside and labeled bladder stone.  Inspection following these maneuvers revealed complete resolution of all visible bladder stone.  Attention was then directed at the right ureteroscopy.  The distal right ureteral stent was grasped, brought to the level of the urethral meatus through which a 0.03 Glidewire was advanced at the level of the upper pole and set aside as a safety wire.  Next, semi-rigid ureteroscopy was performed of the distal two-thirds of the right ureter alongside a separate Sensor working wire.  Several small stone fragments were encountered, grasped and brought out in the entirety, set aside for compositional analysis.  Inspection was performed up to the proximal third.  The semi-rigid ureteroscope was exchanged for the 12/14 38-cm ureteral access sheath over the sensor working wire, which was placed under continuous fluoroscopy to level of proximal ureter.  Next, a flexible digital ureteroscopy was performed of the proximal  right ureter and right kidney.  No additional fragments, mucosal abnormalities were found.  The access sheath was removed under continuous ureteroscopic vision and a new 626 double-J stent was placed on the right side using cystoscopic and fluoroscopic guidance.  Final fluoroscopic images revealed what appeared to be persistence of distal stones on both sides. This was worrisome for possible bilateral ureteral duplication versus large periureteral diverticulum, which had not been previously completely visualized.  Using a semi-rigid ureteroscope, and fluoroscopic guidance indeed a fold lateral to each ureteral orifice was encountered and there appeared to be a separate large calcification within the periureteral diverticula on each side.  Retrograde pyelogram was performed via the semi-rigid ureteroscope to help confirm, no ureteral duplication and this was not detected.  Also an angle tipped Glidewire was used to cannulate this pocket and again no additional complete patent ureters were found again suggesting bilateral large periureteral diverticulum with stone within them.  The necks of these were sufficiently small such that none of the ureteroscope would provide sufficient access to these areas.  At this point, the patient had been in lithotomy position for greater than two hours.  It was clearly felt that given these new findings of residual large volume stone that a third-stage procedure would clearly be warranted and be the safest way to proceed.  As such, the  bladder was emptied per cystoscope.  Procedure was then terminated.  We will plan for third-stage procedure in several weeks addressing the stones in the periureteral diverticulum likely with a ureteroscopic technique bilaterally.  The patient tolerated the procedure well.  There were no immediate periprocedural complications.  The patient was taken to postanesthesia care unit in stable condition.           ______________________________ Alexis Frock, MD     TM/MEDQ  D:  12/18/2013  T:  12/18/2013  Job:  [id number]

## 2013-12-19 NOTE — Op Note (Signed)
NAMEGREGG, WINCHELL NO.:  [id number]  MEDICAL RECORD NO.:  [id number]  LOCATION:  5409                         FACILITY:  [facility]  PHYSICIAN:  Alexis Frock, MD     DATE OF BIRTH:  1945-10-18  DATE OF PROCEDURE: 12/18/2013 DATE OF DISCHARGE:                                OPERATIVE REPORT   PREOPERATIVE DIAGNOSES: 1. Bilateral large ureteral stones. 2. Large bladder stone.  POSTOPERATIVE DIAGNOSES: 1. Bilateral large ureteral stones. 2. Large bladder stone. 3. Large bilateral periureteral diverticulum.  PROCEDURE: 1. Cystoscopy with bilateral retrograde pyelogram interpretation. 2. Exchange of bilateral ureteral stents, left 6 x 26 double-J, right 6x24 JJ. 3. Bilateral second-stage ureteroscopy with laser lithotripsy. 4. Cystolithalopexy stone >2.5cm.  ESTIMATED BLOOD LOSS:  Nil.  COMPLICATIONS:  None.  SPECIMENS: 1. Left ureteral stone fragment for compositional analysis. 2. Bladder stone fragments.  FINDINGS: 1 - Large bladder stone, estimated 4cm 2 - Left > Right Ureteral Stones 3 - Bilateral large stones in paraureteral diverticulae with very narrow neck, only accessible via ureteroscopic technique.  INDICATIONS:  Mr. Nicoson is a pleasant 68 year old gentleman with history of recurrent nephrolithiasis.  He underwent urgent cystoscopy with bilateral stenting in first stage left ureteroscopic stone manipulation on November 26, 2013, for bilateral ureteral stones and refractory pain.  He also had a very large bladder stone as well.  He presents today for second-stage procedure.  We discussed previously that he very well may require multistage approach including even third stage giving and is very large volume of stones.  Informed consent was obtained and placed in medical record.  PROCEDURE IN DETAIL:  The patient being, Erick Stranahan, procedure being bilateral ureteroscopic stimulation and cystolitholapaxy was confirmed. Procedure was carried  out.  Time-out was performed.  Intravenous antibiotics were administered.  General LMA anesthesia was introduced. The patient was into a low lithotomy position.  Sterile field was created, prepping the patient's penis, perineum, proximal thighs using iodine x3.  Next, cystourethroscopy was performed using a 22-French rigid scope with 12-degree offset lens.  Inspection of the bladder revealed a large dominant bladder stone posterior inferior bladder. Distal end of bilateral stents were seen in situ.  The distal on the left stent was grasped, brought to the level of the urethral meatus through which a 0.03 Glidewire was advanced to the level of the upper pole.  It was exchanged for a 6-French Foley catheter.  Left retrograde pyelogram demonstrated  left ureter, single system, left kidney.  There was a very large tortuosity and dilation consistent with known chronic left hydronephrosis, large filling defect in distal ureter consistent with large volume distal stone.  The zip wire was once again advanced, set aside as a safety wire.  Next, semi-rigid ureteroscopy was on the distal left ureter alongside a separate Sensor working wire and distal ureter the very large stone in question was indeed found. Holmium laser energy applied to the stone using settings of 0.5 joules and 10 hertz fragmenting into pieces approximately 4 mm or less in diameter.  These were sequentially grasped and brought to the level of the urinary bladder.  A separate component was then brought out its entirety and  set aside for compositional analysis.  Semi-rigid ureteroscopy was then performed at the distal 2/3 left ureter nodes with additional calcifications or mucosal abnormalities were found.  Again the left ureter was quite tortuous and dilated consistent with chronic hydronephrosis to verify no residual left renal stone fragments.  The semi-rigid ureteroscope was exchanged for the 38-cm 12/14 ureteral access sheath  over the sensor working wire to level the proximal ureter and flexible digital ureteroscopy was performed of the left kidney. Systematic inspection revealed complete resolution of all stone fragments.  There again were chronic changes of left kidney consistent with chronic hydronephrosis.  There was some proteinaceous debris, but no residual stones found on this location.  The access sheath was removed under continuous ureteroscopic vision, no mucosal abnormalities were found.  A new 6 x 24 stent was placed on the left side using cystoscopic and fluoroscopic guidance.  Attention was then directed at the cystolitholapaxy.  Using the ACMI 26-French continuous flow resectoscope sheath and a 365 nanometer laser fiber, holmium laser energy was applied to the large dominant bladder stone, the settings of 4 joules and 5 hertz fragmenting the stone into pieces approximately 6-8 mm in diameter or less.  It was then irrigated via the resectoscope sheath and set aside and labeled bladder stone.  Inspection following these maneuvers revealed complete resolution of all visible bladder stone.  Attention was then directed at the right ureteroscopy.  The distal right ureteral stent was grasped, brought to the level of the urethral meatus through which a 0.03 Glidewire was advanced at the level of the upper pole and set aside as a safety wire.  Next, semi-rigid ureteroscopy was performed of the distal two-thirds of the right ureter alongside a separate Sensor working wire.  Several small stone fragments were encountered, grasped and brought out in the entirety, set aside for compositional analysis.  Inspection was performed up to the proximal third.  The semi-rigid ureteroscope was exchanged for the 12/14 38-cm ureteral access sheath over the sensor working wire, which was placed under continuous fluoroscopy to level of proximal ureter.  Next, a flexible digital ureteroscopy was performed of the proximal  right ureter and right kidney.  No additional fragments, mucosal abnormalities were found.  The access sheath was removed under continuous ureteroscopic vision and a new 626 double-J stent was placed on the right side using cystoscopic and fluoroscopic guidance.  Final fluoroscopic images revealed what appeared to be persistence of distal stones on both sides. This was worrisome for possible bilateral ureteral duplication versus large periureteral diverticulum, which had not been previously completely visualized.  Using a semi-rigid ureteroscope, and fluoroscopic guidance indeed a fold lateral to each ureteral orifice was encountered and there appeared to be a separate large calcification within the periureteral diverticula on each side.  Retrograde pyelogram was performed via the semi-rigid ureteroscope to help confirm, no ureteral duplication and this was not detected.  Also an angle tipped Glidewire was used to cannulate this pocket and again no additional complete patent ureters were found again suggesting bilateral large periureteral diverticulum with stone within them.  The necks of these were sufficiently small such that none of the ureteroscope would provide sufficient access to these areas.  At this point, the patient had been in lithotomy position for greater than two hours.  It was clearly felt that given these new findings of residual large volume stone that a third-stage procedure would clearly be warranted and be the safest way to proceed.  As such, the  bladder was emptied per cystoscope.  Procedure was then terminated.  We will plan for third-stage procedure in several weeks addressing the stones in the periureteral diverticulum likely with a ureteroscopic technique bilaterally.  The patient tolerated the procedure well.  There were no immediate periprocedural complications.  The patient was taken to postanesthesia care unit in stable condition.           ______________________________ Alexis Frock, MD     TM/MEDQ  D:  12/18/2013  T:  12/18/2013  Job:  [id number]

## 2013-12-20 ENCOUNTER — Encounter (HOSPITAL_BASED_OUTPATIENT_CLINIC_OR_DEPARTMENT_OTHER): Payer: Self-pay | Admitting: Urology

## 2013-12-23 ENCOUNTER — Other Ambulatory Visit: Payer: Self-pay | Admitting: Urology

## 2013-12-23 DIAGNOSIS — N200 Calculus of kidney: Secondary | ICD-10-CM | POA: Diagnosis not present

## 2013-12-23 DIAGNOSIS — Q649 Congenital malformation of urinary system, unspecified: Secondary | ICD-10-CM | POA: Diagnosis not present

## 2013-12-23 DIAGNOSIS — R339 Retention of urine, unspecified: Secondary | ICD-10-CM | POA: Diagnosis not present

## 2013-12-23 DIAGNOSIS — N210 Calculus in bladder: Secondary | ICD-10-CM | POA: Diagnosis not present

## 2013-12-31 ENCOUNTER — Encounter (HOSPITAL_BASED_OUTPATIENT_CLINIC_OR_DEPARTMENT_OTHER): Payer: Self-pay | Admitting: *Deleted

## 2013-12-31 NOTE — Progress Notes (Addendum)
NPO AFTER MN. ARRIVE AT 0915.  NEEDS ISTAT 8.  WILL DO ADVAIR INHALER AM DOS W/ SIPS OF WATER.

## 2014-01-08 ENCOUNTER — Encounter (HOSPITAL_BASED_OUTPATIENT_CLINIC_OR_DEPARTMENT_OTHER): Payer: Medicare Other | Admitting: Anesthesiology

## 2014-01-08 ENCOUNTER — Ambulatory Visit (HOSPITAL_BASED_OUTPATIENT_CLINIC_OR_DEPARTMENT_OTHER): Payer: Medicare Other | Admitting: Anesthesiology

## 2014-01-08 ENCOUNTER — Encounter (HOSPITAL_BASED_OUTPATIENT_CLINIC_OR_DEPARTMENT_OTHER)
Admission: RE | Disposition: A | Discharge status: Home / Self Care | Payer: Self-pay | Source: Ambulatory Visit | Attending: Urology

## 2014-01-08 ENCOUNTER — Encounter (HOSPITAL_BASED_OUTPATIENT_CLINIC_OR_DEPARTMENT_OTHER): Payer: Self-pay

## 2014-01-08 ENCOUNTER — Ambulatory Visit (HOSPITAL_BASED_OUTPATIENT_CLINIC_OR_DEPARTMENT_OTHER)
Admission: RE | Admit: 2014-01-08 | Discharge: 2014-01-08 | Disposition: A | Discharge status: Home / Self Care | Payer: Medicare Other | Source: Ambulatory Visit | Attending: Urology | Admitting: Urology

## 2014-01-08 DIAGNOSIS — Z87891 Personal history of nicotine dependence: Secondary | ICD-10-CM | POA: Insufficient documentation

## 2014-01-08 DIAGNOSIS — N201 Calculus of ureter: Secondary | ICD-10-CM | POA: Insufficient documentation

## 2014-01-08 DIAGNOSIS — N1330 Unspecified hydronephrosis: Secondary | ICD-10-CM | POA: Insufficient documentation

## 2014-01-08 DIAGNOSIS — J449 Chronic obstructive pulmonary disease, unspecified: Secondary | ICD-10-CM | POA: Insufficient documentation

## 2014-01-08 DIAGNOSIS — N289 Disorder of kidney and ureter, unspecified: Secondary | ICD-10-CM | POA: Insufficient documentation

## 2014-01-08 DIAGNOSIS — L408 Other psoriasis: Secondary | ICD-10-CM | POA: Diagnosis not present

## 2014-01-08 DIAGNOSIS — J4489 Other specified chronic obstructive pulmonary disease: Secondary | ICD-10-CM | POA: Insufficient documentation

## 2014-01-08 DIAGNOSIS — N200 Calculus of kidney: Secondary | ICD-10-CM | POA: Diagnosis not present

## 2014-01-08 DIAGNOSIS — Z981 Arthrodesis status: Secondary | ICD-10-CM | POA: Insufficient documentation

## 2014-01-08 DIAGNOSIS — I10 Essential (primary) hypertension: Secondary | ICD-10-CM | POA: Insufficient documentation

## 2014-01-08 HISTORY — PX: CYSTOSCOPY W/ URETERAL STENT REMOVAL: SHX1430

## 2014-01-08 HISTORY — PX: CYSTOSCOPY WITH RETROGRADE PYELOGRAM, URETEROSCOPY AND STENT PLACEMENT: SHX5789

## 2014-01-08 HISTORY — DX: Personal history of other diseases of urinary system: Z87.448

## 2014-01-08 HISTORY — PX: HOLMIUM LASER APPLICATION: SHX5852

## 2014-01-08 LAB — POCT I-STAT, CHEM 8
BUN: 15 mg/dL (ref 6–23)
Calcium, Ion: 1.03 mmol/L — ABNORMAL LOW (ref 1.13–1.30)
Chloride: 106 mEq/L (ref 96–112)
Creatinine, Ser: 1 mg/dL (ref 0.50–1.35)
Glucose, Bld: 89 mg/dL (ref 70–99)
HCT: 33 % — ABNORMAL LOW (ref 39.0–52.0)
Hemoglobin: 11.2 g/dL — ABNORMAL LOW (ref 13.0–17.0)
Potassium: 3.6 mEq/L — ABNORMAL LOW (ref 3.7–5.3)
Sodium: 140 mEq/L (ref 137–147)
TCO2: 23 mmol/L (ref 0–100)

## 2014-01-08 SURGERY — CYSTOURETEROSCOPY, WITH RETROGRADE PYELOGRAM AND STENT INSERTION
Anesthesia: General | Site: Ureter | Laterality: Bilateral

## 2014-01-08 MED ORDER — FENTANYL CITRATE 0.05 MG/ML IJ SOLN
INTRAMUSCULAR | Status: AC
  Filled 2014-01-08: qty 6

## 2014-01-08 MED ORDER — LIDOCAINE HCL (CARDIAC) 20 MG/ML IV SOLN
INTRAVENOUS | Status: DC | PRN
  Administered 2014-01-08: 60 mg via INTRAVENOUS

## 2014-01-08 MED ORDER — LACTATED RINGERS IV SOLN
INTRAVENOUS | Status: DC | PRN
  Administered 2014-01-08 (×2): via INTRAVENOUS

## 2014-01-08 MED ORDER — FENTANYL CITRATE 0.05 MG/ML IJ SOLN
INTRAMUSCULAR | Status: DC | PRN
  Administered 2014-01-08 (×4): 25 ug via INTRAVENOUS

## 2014-01-08 MED ORDER — LACTATED RINGERS IV SOLN
INTRAVENOUS | Status: DC
  Administered 2014-01-08: 10:00:00 via INTRAVENOUS
  Filled 2014-01-08: qty 1000

## 2014-01-08 MED ORDER — KETOROLAC TROMETHAMINE 30 MG/ML IJ SOLN
INTRAMUSCULAR | Status: DC | PRN
  Administered 2014-01-08: 30 mg via INTRAVENOUS

## 2014-01-08 MED ORDER — GENTAMICIN IN SALINE 1.6-0.9 MG/ML-% IV SOLN
80.0000 mg | INTRAVENOUS | Status: DC
  Filled 2014-01-08: qty 50

## 2014-01-08 MED ORDER — DEXTROSE 5 % IV SOLN
5.0000 mg/kg | INTRAVENOUS | Status: DC
  Administered 2014-01-08: 290.5 mg via INTRAVENOUS
  Filled 2014-01-08: qty 7.25

## 2014-01-08 MED ORDER — CEPHALEXIN 500 MG PO CAPS: 500.0000 mg | ORAL_CAPSULE | Freq: Two times a day (BID) | ORAL | Status: DC

## 2014-01-08 MED ORDER — MIDAZOLAM HCL 2 MG/2ML IJ SOLN
INTRAMUSCULAR | Status: AC
  Filled 2014-01-08: qty 2

## 2014-01-08 MED ORDER — FLUCONAZOLE IN SODIUM CHLORIDE 200-0.9 MG/100ML-% IV SOLN
INTRAVENOUS | Status: DC | PRN
Start: 2014-01-08 — End: 2014-01-08
  Administered 2014-01-08: 100 mg via INTRAVENOUS

## 2014-01-08 MED ORDER — BELLADONNA ALKALOIDS-OPIUM 16.2-60 MG RE SUPP
RECTAL | Status: AC
  Filled 2014-01-08: qty 1

## 2014-01-08 MED ORDER — DEXAMETHASONE SODIUM PHOSPHATE 4 MG/ML IJ SOLN
INTRAMUSCULAR | Status: DC | PRN
Start: 2014-01-08 — End: 2014-01-08
  Administered 2014-01-08: 10 mg via INTRAVENOUS

## 2014-01-08 MED ORDER — EPHEDRINE SULFATE 50 MG/ML IJ SOLN
INTRAMUSCULAR | Status: DC | PRN
Start: 2014-01-08 — End: 2014-01-08
  Administered 2014-01-08: 10 mg via INTRAVENOUS

## 2014-01-08 MED ORDER — SENNOSIDES-DOCUSATE SODIUM 8.6-50 MG PO TABS: 1.0000 | ORAL_TABLET | Freq: Two times a day (BID) | ORAL | Status: DC

## 2014-01-08 MED ORDER — PROPOFOL 10 MG/ML IV BOLUS
INTRAVENOUS | Status: DC | PRN
  Administered 2014-01-08: 160 mg via INTRAVENOUS
  Administered 2014-01-08: 40 mg via INTRAVENOUS

## 2014-01-08 MED ORDER — ONDANSETRON HCL 4 MG/2ML IJ SOLN
INTRAMUSCULAR | Status: DC | PRN
  Administered 2014-01-08: 4 mg via INTRAVENOUS

## 2014-01-08 MED ORDER — IOHEXOL 350 MG/ML SOLN
INTRAVENOUS | Status: DC | PRN
  Administered 2014-01-08: 10 mL

## 2014-01-08 MED ORDER — OXYCODONE HCL 5 MG PO TABS: 5.0000 mg | ORAL_TABLET | ORAL | Status: DC | PRN

## 2014-01-08 MED ORDER — SODIUM CHLORIDE 0.9 % IR SOLN
Status: DC | PRN
  Administered 2014-01-08: 4000 mL

## 2014-01-08 SURGICAL SUPPLY — 40 items
BAG DRAIN URO-CYSTO SKYTR STRL (DRAIN) ×3 IMPLANT
BASKET LASER NITINOL 1.9FR (BASKET) ×3 IMPLANT
BASKET STNLS GEMINI 4WIRE 3FR (BASKET) IMPLANT
BASKET ZERO TIP NITINOL 2.4FR (BASKET) IMPLANT
CANISTER SUCT LVC 12 LTR MEDI- (MISCELLANEOUS) ×3 IMPLANT
CATH COUDE FOLEY 2W 5CC 18FR (CATHETERS) ×3 IMPLANT
CATH INTERMIT  6FR 70CM (CATHETERS) ×3 IMPLANT
CATH URET 5FR 28IN CONE TIP (BALLOONS)
CATH URET 5FR 28IN OPEN ENDED (CATHETERS) IMPLANT
CATH URET 5FR 70CM CONE TIP (BALLOONS) IMPLANT
CLOTH BEACON ORANGE TIMEOUT ST (SAFETY) ×3 IMPLANT
DRAPE CAMERA CLOSED 9X96 (DRAPES) ×3 IMPLANT
ELECT REM PT RETURN 9FT ADLT (ELECTROSURGICAL)
ELECTRODE REM PT RTRN 9FT ADLT (ELECTROSURGICAL) IMPLANT
FIBER LASER FLEXIVA 200 (UROLOGICAL SUPPLIES) ×3 IMPLANT
FIBER LASER FLEXIVA 365 (UROLOGICAL SUPPLIES) IMPLANT
GLOVE BIO SURGEON STRL SZ7.5 (GLOVE) ×3 IMPLANT
GLOVE BIOGEL M STER SZ 6 (GLOVE) ×3 IMPLANT
GLOVE BIOGEL PI IND STRL 6.5 (GLOVE) ×2 IMPLANT
GLOVE BIOGEL PI INDICATOR 6.5 (GLOVE) ×4
GOWN PREVENTION PLUS LG XLONG (DISPOSABLE) ×6 IMPLANT
GOWN STRL REIN XL XLG (GOWN DISPOSABLE) ×3 IMPLANT
GOWN STRL REUS W/TWL LRG LVL3 (GOWN DISPOSABLE) ×3 IMPLANT
GOWN STRL REUS W/TWL XL LVL3 (GOWN DISPOSABLE) ×3 IMPLANT
GUIDEWIRE 0.038 PTFE COATED (WIRE) IMPLANT
GUIDEWIRE ANG ZIPWIRE 038X150 (WIRE) ×3 IMPLANT
GUIDEWIRE STR DUAL SENSOR (WIRE) ×3 IMPLANT
IV NS 1000ML (IV SOLUTION) ×8
IV NS 1000ML BAXH (IV SOLUTION) ×4 IMPLANT
IV NS IRRIG 3000ML ARTHROMATIC (IV SOLUTION) IMPLANT
KIT BALLIN UROMAX 15FX10 (LABEL) IMPLANT
KIT BALLN UROMAX 15FX4 (MISCELLANEOUS) IMPLANT
KIT BALLN UROMAX 26 75X4 (MISCELLANEOUS)
PACK CYSTOSCOPY (CUSTOM PROCEDURE TRAY) ×3 IMPLANT
SET HIGH PRES BAL DIL (LABEL)
SHEATH URET ACCESS 12FR/35CM (UROLOGICAL SUPPLIES) IMPLANT
SHEATH URET ACCESS 12FR/55CM (UROLOGICAL SUPPLIES) IMPLANT
SYRINGE 10CC LL (SYRINGE) ×3 IMPLANT
SYRINGE IRR TOOMEY STRL 70CC (SYRINGE) ×3 IMPLANT
TUBE FEEDING 8FR 16IN STR KANG (MISCELLANEOUS) IMPLANT
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
## 2014-01-08 NOTE — Anesthesia Preprocedure Evaluation (Signed)
Anesthesia Evaluation  Patient identified by MRN, date of birth, ID band Patient awake    Reviewed: Allergy & Precautions, H&P , NPO status , Patient's Chart, lab work & pertinent test results  History of Anesthesia Complications (+) history of anesthetic complications  Airway Mallampati: II TM Distance: >3 FB Neck ROM: full    Dental  (+) Caps, Dental Advisory Given Bridge upper front:   Pulmonary asthma , COPD COPD inhaler, former smoker,  PFT 2009 moderate to severe with good reversibility breath sounds clear to auscultation  Pulmonary exam normal       Cardiovascular hypertension, Rhythm:regular Rate:Normal     Neuro/Psych Cervical fusion negative neurological ROS  negative psych ROS   GI/Hepatic negative GI ROS, Neg liver ROS,   Endo/Other  negative endocrine ROS  Renal/GU Renal disease     Musculoskeletal   Abdominal   Peds  Hematology negative hematology ROS (+)   Anesthesia Other Findings   Reproductive/Obstetrics                           Anesthesia Physical  Anesthesia Plan  ASA: III  Anesthesia Plan: General   Post-op Pain Management:    Induction: Intravenous  Airway Management Planned: LMA  Additional Equipment:   Intra-op Plan:   Post-operative Plan: Extubation in OR  Informed Consent: I have reviewed the patients History and Physical, chart, labs and discussed the procedure including the risks, benefits and alternatives for the proposed anesthesia with the patient or authorized representative who has indicated his/her understanding and acceptance.   Dental Advisory Given  Plan Discussed with: CRNA and Surgeon  Anesthesia Plan Comments:         Anesthesia Quick Evaluation

## 2014-01-08 NOTE — Brief Op Note (Signed)
01/08/2014  11:10 AM  PATIENT:  Lacey P [place]  68 y.o. male  PRE-OPERATIVE DIAGNOSIS:  LARGE VOLUME BILATERAL URETERAL STONES  POST-OPERATIVE DIAGNOSIS:  LARGE VOLUME BILATERAL URETERAL STONES  PROCEDURE:  Procedure(s): CYSTOSCOPY WITH RETROGRADE PYELOGRAM, 3RD STAGE URETEROSCOPY WITH STONE EXTRACTION (Bilateral) HOLMIUM LASER APPLICATION (Bilateral) CYSTOSCOPY WITH STENT REMOVAL (Bilateral)  SURGEON:  Surgeon(s) and Role:    * Alexis Frock, MD - Primary  PHYSICIAN ASSISTANT:   ASSISTANTS: none   ANESTHESIA:   general  EBL:  Total I/O In: 1000 [I.V.:1000] Out: -   BLOOD ADMINISTERED:none  DRAINS: 57F foley to straight drain   LOCAL MEDICATIONS USED:  NONE  SPECIMEN:  Source of Specimen:  Rt and Lt Paraureteral Stones  DISPOSITION OF SPECIMEN:  given to patient  COUNTS:  YES  TOURNIQUET:  * No tourniquets in log *  DICTATION: .Other Dictation: Dictation Number [id number]  PLAN OF CARE: Discharge to home after PACU  PATIENT DISPOSITION:  PACU - hemodynamically stable.   Delay start of Pharmacological VTE agent (>24hrs) due to surgical blood loss or risk of bleeding: yes

## 2014-01-08 NOTE — Anesthesia Procedure Notes (Signed)
Procedure Name: LMA Insertion Date/Time: 01/08/2014 9:58 AM Performed by: Justice Rocher Pre-anesthesia Checklist: Patient identified, Emergency Drugs available, Suction available and Patient being monitored Patient Re-evaluated:Patient Re-evaluated prior to inductionOxygen Delivery Method: Circle System Utilized Preoxygenation: Pre-oxygenation with 100% oxygen Intubation Type: IV induction Ventilation: Mask ventilation without difficulty LMA: LMA inserted LMA Size: 4.0 Number of attempts: 1 Airway Equipment and Method: bite block Placement Confirmation: positive ETCO2 Tube secured with: Tape Dental Injury: Teeth and Oropharynx as per pre-operative assessment

## 2014-01-08 NOTE — Discharge Instructions (Signed)
1 - You may have urinary urgency (bladder spasms) and bloody urine on / off with catheter in place. This is normal.  2 - Call MD or go to ER for fever >102, severe pain / nausea / vomiting not relieved by medications, or acute change in medical status   Post Anesthesia Home Care Instructions  Activity: Get plenty of rest for the remainder of the day. A responsible adult should stay with you for 24 hours following the procedure.  For the next 24 hours, DO NOT: -Drive a car -Paediatric nurse -Drink alcoholic beverages -Take any medication unless instructed by your physician -Make any legal decisions or sign important papers.  Meals: Start with liquid foods such as gelatin or soup. Progress to regular foods as tolerated. Avoid greasy, spicy, heavy foods. If nausea and/or vomiting occur, drink only clear liquids until the nausea and/or vomiting subsides. Call your physician if vomiting continues.  Special Instructions/Symptoms: Your throat may feel dry or sore from the anesthesia or the breathing tube placed in your throat during surgery. If this causes discomfort, gargle with warm salt water. The discomfort should disappear within 24 hours.

## 2014-01-08 NOTE — Transfer of Care (Signed)
Immediate Anesthesia Transfer of Care Note  Patient: Brian Leon  Procedure(s) Performed: Procedure(s) (LRB): CYSTOSCOPY WITH RETROGRADE PYELOGRAM, 3RD STAGE URETEROSCOPY WITH STONE EXTRACTION (Bilateral) HOLMIUM LASER APPLICATION (Bilateral) CYSTOSCOPY WITH STENT REMOVAL (Bilateral)  Patient Location: PACU  Anesthesia Type: General  Level of Consciousness: awake, sedated, patient cooperative and responds to stimulation  Airway & Oxygen Therapy: Patient Spontanous Breathing and Patient connected to face mask oxygen  Post-op Assessment: Report given to PACU RN, Post -op Vital signs reviewed and stable and Patient moving all extremities  Post vital signs: Reviewed and stable  Complications: No apparent anesthesia complications

## 2014-01-08 NOTE — Anesthesia Postprocedure Evaluation (Signed)
Anesthesia Post Note  Patient: Brian Leon  Procedure(s) Performed: Procedure(s) (LRB): CYSTOSCOPY WITH RETROGRADE PYELOGRAM, 3RD STAGE URETEROSCOPY WITH STONE EXTRACTION (Bilateral) HOLMIUM LASER APPLICATION (Bilateral) CYSTOSCOPY WITH STENT REMOVAL (Bilateral)  Anesthesia type: General  Patient location: PACU  Post pain: Pain level controlled  Post assessment: Post-op Vital signs reviewed  Last Vitals: BP 157/86  Pulse 82  Temp(Src) 36.3 C (Oral)  Resp 18  Wt 128 lb (58.06 kg)  SpO2 98%  Post vital signs: Reviewed  Level of consciousness: sedated  Complications: No apparent anesthesia complications

## 2014-01-09 NOTE — Op Note (Signed)
Brian Leon, JENTZ NO.:  [id number]  MEDICAL RECORD NO.:  [id number]  LOCATION:                                 FACILITY:  PHYSICIAN:  Alexis Frock, MD     DATE OF BIRTH:  1946/03/29  DATE OF PROCEDURE:  01/08/2014 DATE OF DISCHARGE:  01/08/2014                              OPERATIVE REPORT   DIAGNOSIS:  Massive volume, bilateral ureteral and renal stones.  PROCEDURES: 1. Third stage bilateral ureteroscopy with laser lithotripsy. 2. Intraoperative fluoroscopy with interpretation. 3. Removal of bilateral ureteral stents.  ESTIMATED BLOOD LOSS:  Nil.  COMPLICATIONS:  None.  SPECIMEN:  Bilateral periureteral diverticular stones given the patient.  FINDINGS: 1. Very large, ovoid bilateral periureteral stones each end     diverticulum just lateral to the ureteral orifices with very narrow     neck, total stone volume approximately 3 cm total. 2. Otherwise unremarkable urinary bladder.  INDICATIONS:  Mr. Begue is an unfortunate 68 year old gentleman with recent history of massive volume bilateral urolithiasis with renal stones and ureteral stones.  He underwent first-stage procedure on November 26, 2013, at which point, left ureteral stone was addressed and bilateral stents were placed.  He had bilateral obstruction.  He underwent second-stage procedure on December 18, 2013, and remaining component of his bilateral ureteral and renal stones were addressed.  On spot fluoroscopic imaging at the conclusion of his final procedure, it appeared that he had still large volume distal ureteral stones.  Further investigation revealed large volume bilateral periureteral diverticulum with a very narrow neck and it was felt that third-stage procedure was warranted to get him stone free, and he wished to proceed.  Informed consent was obtained and placed in the medical record.  PROCEDURE IN DETAIL:  The patient being, [employer], was verified. Procedure being  third-stage bilateral ureteroscopic stone manipulation was confirmed.  Procedure was carried out.  Time-out was performed. Intravenous antibiotics were administered.  General LMA anesthesia was introduced.  The patient was placed into a low lithotomy position, and sterile field was created by prepping and draping the patient's penis, perineum, and proximal thighs using iodine x3.  Next, cystourethroscopy was performed using a 22-French rigid scope with 12-degree offset lens. Inspection of the urinary bladder revealed no calcifications, the distal end of stents were seen in situ.  There were no papillary lesions.  The neck of the periureteral diverticulum was visible bilaterally; however, could not be entered via the cystoscope and so the ureteroscopic technique would clearly be warranted.  The in situ ureteral stents were left and at this point, act as markers and provide rigidity to the ureters while working in the periureteral diverticulum.  There was somewhat cloudy appearance of the urine and an additional 100 mL of Diflucan was given at this point, however, there was no erythema at the bladder that was concerning for significant infection.  An 8-French feeding tube was placed in the urinary bladder for pressure release and a semi-rigid ureteroscope was used first on the right side to inspect the right periurethral diverticulum, indeed there was a large cavity with significant stone, approximately 1.5 cm or greater in diameter, this was apparently  much too large for simple basketing given the caliber of the neck of the diverticulum.  As such, holmium laser energy was applied to the stone, fragmenting it approximately four smaller pieces, which were then grasped with escape basket and brought to the level of the urinary bladder.  Next, using a 28-French resectoscope sheath, these individual fragments were irrigated out and set aside to given to the patient.  Attention was then directed  at the left side. Again using semi-rigid ureteroscope with 8-French feeding tube in the urinary bladder for pressure release, the left periureteral diverticulum was carefully inspected and even larger stone was found in this location, again appeared to be much too large for simple basketing.  As such, holmium laser energy was applied to the stone using settings of 10 hertz and 1 joule, fragmenting the stone approximately 3-4 smaller pieces, which were then grasped and brought to the level of the urinary bladder, which were again irrigated via the 28-French resectoscope sheath.  Following these maneuvers, spot fluoroscopic imaging revealed complete resolution of all visible urolithiasis.  The ureters themselves were uninjured.  There was no evidence of bladder perforation.  It was felt that the present ureteral stents could safely be removed and as such, using rigid graspers, they were both grasped and brought out in their entirety sequentially and found to be completely intact.  The patient has a significant history of postop urinary retention after his most two recent procedures.  Given he required bilateral procedures and the use of 28-French cystoscope sheath, it was felt that the safest way to proceed would be to place a Foley catheter at this point and allow for decreased filling of urinary tract and trial void in several days. As such, a new 18-French Coude Foley catheter was placed per urethra to straight drain, 10 mL sterile water in the balloon.  The procedure was then terminated.  The patient tolerated the procedure well.  There were no immediate periprocedural complications.  The patient was taken to the postanesthesia care unit in stable condition.          ______________________________ Alexis Frock, MD     TM/MEDQ  D:  01/08/2014  T:  01/08/2014  Job:  [id number]

## 2014-01-10 ENCOUNTER — Encounter (HOSPITAL_BASED_OUTPATIENT_CLINIC_OR_DEPARTMENT_OTHER): Payer: Self-pay | Admitting: Urology

## 2014-01-10 DIAGNOSIS — R339 Retention of urine, unspecified: Secondary | ICD-10-CM | POA: Diagnosis not present

## 2014-01-23 DIAGNOSIS — N200 Calculus of kidney: Secondary | ICD-10-CM | POA: Diagnosis not present

## 2014-01-23 DIAGNOSIS — N281 Cyst of kidney, acquired: Secondary | ICD-10-CM | POA: Diagnosis not present

## 2014-01-23 DIAGNOSIS — Q649 Congenital malformation of urinary system, unspecified: Secondary | ICD-10-CM | POA: Diagnosis not present

## 2014-01-23 DIAGNOSIS — N210 Calculus in bladder: Secondary | ICD-10-CM | POA: Diagnosis not present

## 2014-01-28 DIAGNOSIS — Q649 Congenital malformation of urinary system, unspecified: Secondary | ICD-10-CM | POA: Diagnosis not present

## 2014-01-28 DIAGNOSIS — N281 Cyst of kidney, acquired: Secondary | ICD-10-CM | POA: Diagnosis not present

## 2014-01-28 DIAGNOSIS — N200 Calculus of kidney: Secondary | ICD-10-CM | POA: Diagnosis not present

## 2014-01-28 DIAGNOSIS — N210 Calculus in bladder: Secondary | ICD-10-CM | POA: Diagnosis not present

## 2014-01-30 DIAGNOSIS — Z79899 Other long term (current) drug therapy: Secondary | ICD-10-CM | POA: Diagnosis not present

## 2014-01-30 DIAGNOSIS — L299 Pruritus, unspecified: Secondary | ICD-10-CM | POA: Diagnosis not present

## 2014-01-30 DIAGNOSIS — L2089 Other atopic dermatitis: Secondary | ICD-10-CM | POA: Diagnosis not present

## 2014-01-30 DIAGNOSIS — L280 Lichen simplex chronicus: Secondary | ICD-10-CM | POA: Diagnosis not present

## 2014-01-30 DIAGNOSIS — L570 Actinic keratosis: Secondary | ICD-10-CM | POA: Diagnosis not present

## 2014-02-03 DIAGNOSIS — N200 Calculus of kidney: Secondary | ICD-10-CM | POA: Diagnosis not present

## 2014-02-04 DIAGNOSIS — N200 Calculus of kidney: Secondary | ICD-10-CM | POA: Diagnosis not present

## 2014-02-19 ENCOUNTER — Encounter: Payer: Self-pay | Admitting: Family Medicine

## 2014-02-19 ENCOUNTER — Ambulatory Visit (INDEPENDENT_AMBULATORY_CARE_PROVIDER_SITE_OTHER): Payer: Medicare Other | Admitting: Family Medicine

## 2014-02-19 VITALS — BP 168/90 | HR 63 | Temp 99.0°F | Ht 70.0 in | Wt 138.5 lb

## 2014-02-19 DIAGNOSIS — I10 Essential (primary) hypertension: Secondary | ICD-10-CM

## 2014-02-19 DIAGNOSIS — Z0000 Encounter for general adult medical examination without abnormal findings: Secondary | ICD-10-CM | POA: Diagnosis not present

## 2014-02-19 DIAGNOSIS — Z1211 Encounter for screening for malignant neoplasm of colon: Secondary | ICD-10-CM | POA: Insufficient documentation

## 2014-02-19 DIAGNOSIS — J45909 Unspecified asthma, uncomplicated: Secondary | ICD-10-CM | POA: Diagnosis not present

## 2014-02-19 HISTORY — DX: Encounter for screening for malignant neoplasm of colon: Z12.11

## 2014-02-19 LAB — LIPID PANEL
Cholesterol: 207 mg/dL — ABNORMAL HIGH (ref 0–200)
HDL: 39 mg/dL — ABNORMAL LOW (ref >39)
LDL Cholesterol: 147 mg/dL — ABNORMAL HIGH (ref 0–99)
Total CHOL/HDL Ratio: 5.3 Ratio
Triglycerides: 104 mg/dL (ref <150)
VLDL: 21 mg/dL (ref 0–40)

## 2014-02-19 MED ORDER — HYDROCHLOROTHIAZIDE 12.5 MG PO CAPS: 12.5000 mg | ORAL_CAPSULE | Freq: Two times a day (BID) | ORAL | Status: DC

## 2014-02-19 MED ORDER — ALBUTEROL SULFATE HFA 108 (90 BASE) MCG/ACT IN AERS: 1.0000 | INHALATION_SPRAY | Freq: Four times a day (QID) | RESPIRATORY_TRACT | Status: DC | PRN

## 2014-02-19 MED ORDER — FLUTICASONE PROPIONATE 50 MCG/ACT NA SUSP: 1.0000 | Freq: Every day | NASAL | Status: DC

## 2014-02-19 MED ORDER — FLUTICASONE-SALMETEROL 500-50 MCG/DOSE IN AEPB: 1.0000 | INHALATION_SPRAY | Freq: Two times a day (BID) | RESPIRATORY_TRACT | Status: DC

## 2014-02-19 NOTE — Assessment & Plan Note (Signed)
Patient controlled when on medications of Advair and Flonase. Will refill both today. Including albuterol rescue inhaler

## 2014-02-19 NOTE — Assessment & Plan Note (Addendum)
Patiently currently up-to-date on colonoscopy. All Immunizations are up-to-date except for shingles vaccination; patient currently on cyclosporine and cannot have vaccination today. Will consider after he discontinues cyclosporine. All lab work is up-to-date with the exception of lipids. Will obtain a cholesterol panel today.

## 2014-02-19 NOTE — Progress Notes (Signed)
   Subjective:    Patient ID: ARTH NICASTRO, male    DOB: [birth year], 68 y.o.   MRN: [id number]  HPI Brian Leon is a 68 y.o. male with complaints of HTN, ankle swelling, and medication refill for asthma.  New onset hypertension: Patient has a skin condition causing him to be on cyclosporine and thalamine. Since the start of cyclosporine his blood pressure has increased. He has 4 elevated blood pressure readings over the last couple months. He likely will be on cyclosporine for at least a year. Patient denies chest pain, shortness of breath, headache, palpitations or dizziness. He has noticed some mild lower extremity swelling.   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 He is on cyclosporine and now thalidomide which she has noticed some improvement on. States most of his labs are taken every month and likely will not need repeat labs today  Review of Systems Negative, with the exception of above mentioned in HPI    Objective:   Physical Exam BP 168/90  Pulse 63  Temp(Src) 99 F (37.2 C) (Oral)  Ht 5\' 10"  (1.778 m)  Wt 138 lb 8 oz (62.823 kg)  BMI 19.87 kg/m2 Gen: Pleasant, thin male, no acute distress nontoxic in appearance. HEENT: AT. [place]. Bilateral tympanic membranes are normal. Bilateral eyes without injections or icterus. MMM. Bilateral nares without erythema or swelling. Throat without erythema or exudates.  CV: RRR, murmurs clicks gallops or rubs Chest: CTAB, no wheeze or crackles Abd: Soft. thin. NTND. BS present. No Masses palpated.  Ext: No erythema. Trace edema bilateral LE. Multiple nodules over shins Skin: No purpura or petechiae. Multiple excoriated nodules over shins, elbows neck and scalp. Neuro: Normal gait. PERLA. EOMi. Alert. Grossly intact.

## 2014-02-19 NOTE — Patient Instructions (Signed)
Sodium-Controlled Diet Sodium is a mineral. It is found in many foods. Sodium may be found naturally or added during the making of a food. The most common form of sodium is salt, which is made up of sodium and chloride. Reducing your sodium intake involves changing your eating habits. The following guidelines will help you reduce the sodium in your diet:  Stop using the salt shaker.  Use salt sparingly in cooking and baking.  Substitute with sodium-free seasonings and spices.  Do not use a salt substitute (potassium chloride) without your caregiver's permission.  Include a variety of fresh, unprocessed foods in your diet.  Limit the use of processed and convenience foods that are high in sodium. USE THE FOLLOWING FOODS SPARINGLY: Breads/Starches  Commercial bread stuffing, commercial pancake or waffle mixes, coating mixes. Waffles. Croutons. Prepared (boxed or frozen) potato, rice, or noodle mixes that contain salt or sodium. Salted [place] fries or hash browns. Salted popcorn, breads, crackers, chips, or snack foods. Vegetables  Vegetables canned with salt or prepared in cream, butter, or cheese sauces. Sauerkraut. Tomato or vegetable juices canned with salt.  Fresh vegetables are allowed if rinsed thoroughly. Fruit  Fruit is okay to eat. Meat and Meat Substitutes  Salted or smoked meats, such as bacon or Canadian bacon, chipped or corned beef, hot dogs, salt pork, luncheon meats, pastrami, ham, or sausage. Canned or smoked fish, poultry, or meat. Processed cheese or cheese spreads, blue or Roquefort cheese. Battered or frozen fish products. Prepared spaghetti sauce. Baked beans. Reuben sandwiches. Salted nuts. Caviar. Milk  Limit buttermilk to 1 cup per week. Soups and Combination Foods  Bouillon cubes, canned or dried soups, broth, consomm. Convenience (frozen or packaged) dinners with more than 600 mg sodium. Pot pies, pizza, Asian food, fast food cheeseburgers, and specialty  sandwiches. Desserts and Sweets  Regular (salted) desserts, pie, commercial fruit snack pies, commercial snack cakes, canned puddings.  Eat desserts and sweets in moderation. Fats and Oils  Gravy mixes or canned gravy. No more than 1 to 2 tbs of salad dressing. Chip dips.  Eat fats and oils in moderation. Beverages  See those listed under the vegetables and milk groups. Condiments  Ketchup, mustard, meat sauces, salsa, regular (salted) and lite soy sauce or mustard. Dill pickles, olives, meat tenderizer. Prepared horseradish or pickle relish. Dutch-processed cocoa. Baking powder or baking soda used medicinally. Worcestershire sauce. "Light" salt. Salt substitute, unless approved by your caregiver. Document Released: 03/11/2002 Document Revised: 12/12/2011 Document Reviewed: 10/12/2009 [facility] Patient Information 2014 [place], [place].   Reading Food Labels Foods that are in packaging or containers will often have a Nutrition Facts panel on its side or back. The Nutrition Facts panel provides the nutritional value of the food. This information is helpful when determining healthy food choices. By reading food labels, you will find out the serving size of a food and how many servings the package has. You will also find information about the calorie and fat content, as well as the amount of carbohydrate, and vitamins and minerals. Food labels are a great reference for you to use to learn about the food you are eating. BREAKING DOWN THE FOOD LABEL Serving Size: The serving size is an amount of food and is often listed in cups, weight, or units. All of the nutrition information about the food is listed according to the serving size. If you double the serving size, you must double the amounts on the label.  Servings per [employer] or Package: The number of  servings in the container is listed here.  Calories: The number of calories in one serving is listed here. Everyone needs a different amount of  calories each day. Having calories listed on the label is helpful information for people who would like to keep track of the number of calories they eat to stay at a healthy weight. Calories from Fat:  The number of calories that come from fat in one serving are listed here.  NUTRIENTS THAT ARE LISTED ON THE FOOD LABEL.   Percent Daily Value: The food label helps you know if you are getting the amounts of nutrients you need each day by the percent daily value. It tells you how much of your daily values of each nutrient are provided by one serving of the food. The percent daily value is based on a 2000 calorie diet. You may need more or less than 2000 calories each day.  Total Fat: The total amount of fat in one serving is listed here. The number is shown in grams (g). This information is important for people who want to keep track of the amount of fat in their diet. Foods with high amounts of fat usually have higher calories and may lead to weight gain.  Saturated Fat:  The amount of saturated fat in one serving is listed here. It is also shown in grams. Saturated fat is one type of fat that is found in food. It increases the amount of blood cholesterol more than other types of fat found in food. So saturated fat should be limited in the diet to less than 7 percent of total calories each day for most people. This means that if a person eats 2000 calories each day, they should eat less than 140 calories from saturated fat.  Trans Fat: The amount of trans fat in one serving is listed here. It is also shown in grams. Trans fat is another type of fat that is found in food. It should also be limited to less than 2 grams per day because it increases blood cholesterol.  Cholesterol: The amount of cholesterol in one serving is listed here. It is shown in milligrams (mg). Cholesterol should be limited to no more than 200 mg each day.  Sodium: The amount of sodium in one serving is listed here. It is shown in  milligrams. American Heart Association recommends that sodium should be limited to 1500mg /day. This recommended level of sodium was recently lowered from 2400mg /day.  Total Carbohydrate: The amount of carbohydrate in one serving is listed here. It is shown in grams. This information is important for people with diabetes because they need to manage the amount of carbohydrate they eat. Carbohydrate changes the amount of glucose or sugar in the blood and diabetics do not want that amount to be too high or too low.  Dietary Fiber:  The amount of dietary fiber in one serving is listed here. It is shown in grams. Fiber is a type of carbohydrate. Most people should eat 25 grams of dietary fiber each day.  Sugars: The amount of sugar in one serving is listed here. It is shown in grams. Sugars are also a type of carbohydrate. This value includes both naturally occurring sugars from fruit and milk and added sugars such as honey or table sugar.  Protein: The amount of protein in one serving is listed here. It is shown in grams.  Vitamins and Minerals: Food labels list vitamin A, vitamin C, calcium and iron. They are all shown  as a percent of the daily need one serving of the food provides. For example, if 15% is listed next to iron it means that one serving of that food will give you 15% of the total amount of iron you need for one day.  Calories per Gram: Some food labels will list the number of calories that are in each gram or protein, carbohydrate and fat. Protein has four calories per gram, carbohydrate has four calories per gram, and fat has 9 calories per gram.  Ingredients: Food labels will list each ingredient in the food. The first ingredient listed is the ingredient that the food has the most of. The ingredients are listed in the order of their amount from highest to lowest.  Contains: Food labels may also include this portion of the label as a food allergen warning. Listed here are ingredients that  can cause allergies in some people. Examples of ingredients that are listed are wheat, dairy, eggs, soy and nuts. If a person knows that are allergic to one of these ingredients they will know not to eat the food in the container. Information from www.eatright.Samuel [employer]che Nutritional Analysis Database, ADA Nutrition Care Manual. Document Released: 09/19/2005 Document Revised: 12/12/2011 Document Reviewed: 02/02/2009 [facility] Patient Information 2014 [place], [place].

## 2014-02-19 NOTE — Assessment & Plan Note (Addendum)
Patient with 4 document of elevated blood pressures above goal.  Start HCTZ 12.5 twice a day Salt controlled diet information and food reading label Nurse appointment for BP check after he gets home from [place]. Will decide then upon time of next appt.

## 2014-02-20 ENCOUNTER — Telehealth: Payer: Self-pay | Admitting: Family Medicine

## 2014-02-20 ENCOUNTER — Encounter: Payer: Self-pay | Admitting: Family Medicine

## 2014-02-20 NOTE — Telephone Encounter (Signed)
Sent patient a copy of his labs today. He is to discuss statin use, with use of cyclosporin and thalidomide. CV 10 year risk score 30%.

## 2014-03-10 DIAGNOSIS — L299 Pruritus, unspecified: Secondary | ICD-10-CM | POA: Diagnosis not present

## 2014-03-10 DIAGNOSIS — Z79899 Other long term (current) drug therapy: Secondary | ICD-10-CM | POA: Diagnosis not present

## 2014-03-10 DIAGNOSIS — L280 Lichen simplex chronicus: Secondary | ICD-10-CM | POA: Diagnosis not present

## 2014-03-11 ENCOUNTER — Telehealth: Payer: Self-pay | Admitting: Family Medicine

## 2014-03-11 ENCOUNTER — Ambulatory Visit (INDEPENDENT_AMBULATORY_CARE_PROVIDER_SITE_OTHER): Payer: Medicare Other | Admitting: *Deleted

## 2014-03-11 VITALS — BP 138/78

## 2014-03-11 DIAGNOSIS — Z136 Encounter for screening for cardiovascular disorders: Secondary | ICD-10-CM

## 2014-03-11 DIAGNOSIS — Z0130 Encounter for examination of blood pressure without abnormal findings: Secondary | ICD-10-CM

## 2014-03-11 NOTE — Telephone Encounter (Signed)
Relayed message,patient voiced understanding.[place]

## 2014-03-11 NOTE — Telephone Encounter (Signed)
Please call Brian Leon and tell him I would like to see him in 3 months for hypertension. We will stay where we are with current medications. Continue to watch salt in the diet. Thanks.

## 2014-03-11 NOTE — Progress Notes (Signed)
   Pt in nurse clinic for blood pressure check.  Blood pressure 138/78 manually.  Pt denies any other symptoms today. Pt took medications as prescribed.  Derl Barrow, RN

## 2014-03-19 ENCOUNTER — Other Ambulatory Visit: Payer: Self-pay | Admitting: *Deleted

## 2014-03-19 MED ORDER — FLUTICASONE-SALMETEROL 500-50 MCG/DOSE IN AEPB: 1.0000 | INHALATION_SPRAY | Freq: Two times a day (BID) | RESPIRATORY_TRACT | Status: DC

## 2014-03-20 MED ORDER — FLUTICASONE PROPIONATE 50 MCG/ACT NA SUSP: 1.0000 | Freq: Every day | NASAL | Status: DC

## 2014-03-26 DIAGNOSIS — L280 Lichen simplex chronicus: Secondary | ICD-10-CM | POA: Diagnosis not present

## 2014-03-26 DIAGNOSIS — L281 Prurigo nodularis: Secondary | ICD-10-CM | POA: Insufficient documentation

## 2014-03-26 DIAGNOSIS — Z79899 Other long term (current) drug therapy: Secondary | ICD-10-CM | POA: Diagnosis not present

## 2014-03-26 DIAGNOSIS — Z85828 Personal history of other malignant neoplasm of skin: Secondary | ICD-10-CM | POA: Diagnosis not present

## 2014-04-24 ENCOUNTER — Other Ambulatory Visit: Payer: Self-pay | Admitting: Family Medicine

## 2014-05-06 DIAGNOSIS — L570 Actinic keratosis: Secondary | ICD-10-CM | POA: Diagnosis not present

## 2014-05-06 DIAGNOSIS — Z79899 Other long term (current) drug therapy: Secondary | ICD-10-CM | POA: Diagnosis not present

## 2014-05-06 DIAGNOSIS — L280 Lichen simplex chronicus: Secondary | ICD-10-CM | POA: Diagnosis not present

## 2014-05-12 ENCOUNTER — Other Ambulatory Visit: Payer: Self-pay | Admitting: Family Medicine

## 2014-05-22 ENCOUNTER — Other Ambulatory Visit: Payer: Self-pay | Admitting: Family Medicine

## 2014-06-11 DIAGNOSIS — L280 Lichen simplex chronicus: Secondary | ICD-10-CM | POA: Diagnosis not present

## 2014-06-11 DIAGNOSIS — Z79899 Other long term (current) drug therapy: Secondary | ICD-10-CM | POA: Diagnosis not present

## 2014-07-06 ENCOUNTER — Other Ambulatory Visit: Payer: Self-pay | Admitting: Family Medicine

## 2014-07-09 DIAGNOSIS — L570 Actinic keratosis: Secondary | ICD-10-CM | POA: Diagnosis not present

## 2014-07-09 DIAGNOSIS — Z85828 Personal history of other malignant neoplasm of skin: Secondary | ICD-10-CM | POA: Diagnosis not present

## 2014-07-09 DIAGNOSIS — L209 Atopic dermatitis, unspecified: Secondary | ICD-10-CM | POA: Diagnosis not present

## 2014-07-09 DIAGNOSIS — Z79899 Other long term (current) drug therapy: Secondary | ICD-10-CM | POA: Diagnosis not present

## 2014-07-09 DIAGNOSIS — L281 Prurigo nodularis: Secondary | ICD-10-CM | POA: Diagnosis not present

## 2014-07-09 DIAGNOSIS — L298 Other pruritus: Secondary | ICD-10-CM | POA: Diagnosis not present

## 2014-07-09 DIAGNOSIS — L853 Xerosis cutis: Secondary | ICD-10-CM | POA: Diagnosis not present

## 2014-07-16 ENCOUNTER — Other Ambulatory Visit: Payer: Self-pay | Admitting: Family Medicine

## 2014-07-28 DIAGNOSIS — N281 Cyst of kidney, acquired: Secondary | ICD-10-CM | POA: Diagnosis not present

## 2014-07-28 DIAGNOSIS — N29 Other disorders of kidney and ureter in diseases classified elsewhere: Secondary | ICD-10-CM | POA: Diagnosis not present

## 2014-07-28 DIAGNOSIS — N200 Calculus of kidney: Secondary | ICD-10-CM | POA: Diagnosis not present

## 2014-07-29 ENCOUNTER — Other Ambulatory Visit: Payer: Self-pay | Admitting: Urology

## 2014-07-29 DIAGNOSIS — D495 Neoplasm of unspecified behavior of other genitourinary organs: Secondary | ICD-10-CM | POA: Diagnosis not present

## 2014-07-29 DIAGNOSIS — N200 Calculus of kidney: Secondary | ICD-10-CM | POA: Diagnosis not present

## 2014-07-29 DIAGNOSIS — R339 Retention of urine, unspecified: Secondary | ICD-10-CM | POA: Diagnosis not present

## 2014-08-06 DIAGNOSIS — L281 Prurigo nodularis: Secondary | ICD-10-CM | POA: Diagnosis not present

## 2014-08-06 DIAGNOSIS — L209 Atopic dermatitis, unspecified: Secondary | ICD-10-CM | POA: Diagnosis not present

## 2014-08-06 DIAGNOSIS — Z79899 Other long term (current) drug therapy: Secondary | ICD-10-CM | POA: Diagnosis not present

## 2014-08-14 ENCOUNTER — Other Ambulatory Visit: Payer: Self-pay | Admitting: Family Medicine

## 2014-08-20 ENCOUNTER — Encounter: Payer: Self-pay | Admitting: Family Medicine

## 2014-08-20 ENCOUNTER — Ambulatory Visit (INDEPENDENT_AMBULATORY_CARE_PROVIDER_SITE_OTHER): Payer: Medicare Other | Admitting: Family Medicine

## 2014-08-20 VITALS — BP 134/83 | HR 80 | Temp 98.4°F | Ht 69.5 in | Wt 143.0 lb

## 2014-08-20 DIAGNOSIS — J4530 Mild persistent asthma, uncomplicated: Secondary | ICD-10-CM

## 2014-08-20 DIAGNOSIS — S298XXA Other specified injuries of thorax, initial encounter: Secondary | ICD-10-CM | POA: Diagnosis not present

## 2014-08-20 DIAGNOSIS — I10 Essential (primary) hypertension: Secondary | ICD-10-CM

## 2014-08-20 MED ORDER — OXYCODONE HCL 5 MG PO TABS: 5.0000 mg | ORAL_TABLET | ORAL | Status: DC | PRN

## 2014-08-20 NOTE — Assessment & Plan Note (Signed)
Negative exam today for concerns. Percocet 5 mg immediate release given for discomfort. She advised this is a one-time medication prescription only, and is  for acute pain only.

## 2014-08-20 NOTE — Assessment & Plan Note (Signed)
Patient well controlled on Advair, with little need of albuterol rescue inhaler. Continue current therapy Follow-up as needed

## 2014-08-20 NOTE — Patient Instructions (Signed)
Continue taking your HCTZ as directed twice a day. Continue to watch the salt content in her diet. These have your doctor forwarded your lab use to our clinic so that we may keep record of the blood work you have completed, and therefore no other blood work we need to draw. I will prescribe you Percocet medication for your rib pain, if your pain becomes worse or you have trouble breathing, please make an appointment to see [place] right away or go to your nearest urgent care. It was a pleasure seeing you again.

## 2014-08-20 NOTE — Assessment & Plan Note (Signed)
Patient continues to have hypertension with use of cyclosporine. Appears to be controlled on low-dose HCTZ. Patient to continue 12.5 mg of HCTZ twice a day. He needs to call monitor his blood pressures frequently once off cyclosporine and to assess need of medication. Follow-up 6 months
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
## 2014-08-21 ENCOUNTER — Ambulatory Visit (INDEPENDENT_AMBULATORY_CARE_PROVIDER_SITE_OTHER): Payer: Medicare Other | Admitting: Family Medicine

## 2014-08-21 ENCOUNTER — Encounter: Payer: Self-pay | Admitting: Family Medicine

## 2014-08-21 VITALS — BP 123/71 | HR 80 | Temp 98.1°F | Ht 69.5 in | Wt 143.0 lb

## 2014-08-21 DIAGNOSIS — N63 Unspecified lump in unspecified breast: Secondary | ICD-10-CM | POA: Insufficient documentation

## 2014-08-21 NOTE — Assessment & Plan Note (Signed)
Patient presents today with palpable right breast mass just beneath the nipple. Mild tenderness associated with exam. Patient has no family history of breast cancer to his knowledge. Will obtain mammogram of right breast, call patient with results once becomes available.

## 2014-08-21 NOTE — Patient Instructions (Signed)
I have scheduled you and mammogram to be completed on the area, this could be just fatty tissue. It will need to be evaluated.  I will call you once the results are available.

## 2014-08-21 NOTE — Progress Notes (Signed)
   Subjective:    Patient ID: Brian Leon, male    DOB: 1946-03-06, 68 y.o.   MRN: [id number]  HPI  Right breast mass: Patient presents to family medicine clinic today with a right breast mass just below his nipple. This approximately one month code he noticed it being mildly "tingly "numb. Approximately 1 week after the numbness appeared, he noticed a breast mass. He states that he has not noticed any type of masses or swelling in his left breast. He has no family of breast cancer to his knowledge.  Past Medical History  Diagnosis Date  . Asthma     PFT 2009 showed mod to severe with good reversibility with albuterol  . Osteoporosis   . Psoriasis   . Hypertension   . COPD (chronic obstructive pulmonary disease)   . Diverticulosis of sigmoid colon   . Renal cyst, right     COMPLEX  . Bilateral ureteral calculi   . Blind right eye     WEARS PROSTHESIS  . Allergic rhinitis   . Paraureteric diverticulum     BILATERAL  . Arthritis   . Complication of anesthesia     "woke up before hernia surgery complete" 2012  . History of bladder stone    Allergies  Allergen Reactions  . Acetaminophen Other (See Comments)    "upsets my stomach"  . Sulfa Drugs Cross Reactors Other (See Comments)    Flu like symptoms   Former smoker   Review of Systems Per history of present illness    Objective:   Physical Exam BP 123/71 mmHg  Pulse 80  Temp(Src) 98.1 F (36.7 C) (Oral)  Ht 5' 9.5" (1.765 m)  Wt 143 lb (64.864 kg)  BMI 20.82 kg/m2 Gen: Well-developed, well-nourished, Caucasian male, no acute distress, nontoxic in appearance, thin Neck: Supple, no lymphadenopathy. Chest: No erythema, no swelling. Approximately 7-8 cm surgical scar located beneath right breast. Approximately 2 cm mobile, mildly tender breast mass beneath right areola. No axillary lymphadenopathy noted.     Assessment & Plan:

## 2014-08-26 DIAGNOSIS — N200 Calculus of kidney: Secondary | ICD-10-CM | POA: Diagnosis not present

## 2014-08-26 DIAGNOSIS — Q646 Congenital diverticulum of bladder: Secondary | ICD-10-CM | POA: Diagnosis not present

## 2014-08-26 DIAGNOSIS — D495 Neoplasm of unspecified behavior of other genitourinary organs: Secondary | ICD-10-CM | POA: Diagnosis not present

## 2014-08-27 ENCOUNTER — Other Ambulatory Visit: Payer: Self-pay | Admitting: Family Medicine

## 2014-09-01 ENCOUNTER — Other Ambulatory Visit: Payer: Self-pay | Admitting: Family Medicine

## 2014-09-01 ENCOUNTER — Telehealth: Payer: Self-pay | Admitting: *Deleted

## 2014-09-01 DIAGNOSIS — N63 Unspecified lump in unspecified breast: Secondary | ICD-10-CM

## 2014-09-01 MED ORDER — ALBUTEROL SULFATE HFA 108 (90 BASE) MCG/ACT IN AERS: 1.0000 | INHALATION_SPRAY | Freq: Four times a day (QID) | RESPIRATORY_TRACT | Status: DC | PRN

## 2014-09-01 NOTE — Telephone Encounter (Signed)
Pt states that he never heard about his referral to get a mammogram.  I see where this is ordered but has not been scheduled.  Will forward to Dr. Dierdre Highman team to schedule.  Also pt needs a refill on his albuterol. [place], Salome Spotted

## 2014-09-01 NOTE — Telephone Encounter (Signed)
Made appointment at the breast center for Friday 12/11 @ 10am arrive at 9:45am. Patient notified and agreed to appointment

## 2014-09-12 ENCOUNTER — Ambulatory Visit
Admission: RE | Admit: 2014-09-12 | Discharge: 2014-09-12 | Disposition: A | Discharge status: Home / Self Care | Payer: Medicare Other | Source: Ambulatory Visit | Attending: Family Medicine | Admitting: Family Medicine

## 2014-09-12 DIAGNOSIS — N62 Hypertrophy of breast: Secondary | ICD-10-CM | POA: Diagnosis not present

## 2014-09-12 DIAGNOSIS — N63 Unspecified lump in unspecified breast: Secondary | ICD-10-CM

## 2014-10-01 ENCOUNTER — Encounter (HOSPITAL_COMMUNITY)
Admission: RE | Admit: 2014-10-01 | Discharge: 2014-10-01 | Disposition: A | Discharge status: Home / Self Care | Payer: Medicare Other | Source: Ambulatory Visit | Attending: Urology | Admitting: Urology

## 2014-10-01 ENCOUNTER — Other Ambulatory Visit: Payer: Self-pay

## 2014-10-01 ENCOUNTER — Ambulatory Visit (HOSPITAL_COMMUNITY)
Admission: RE | Admit: 2014-10-01 | Discharge: 2014-10-01 | Disposition: A | Discharge status: Home / Self Care | Payer: Medicare Other | Source: Ambulatory Visit | Attending: Anesthesiology | Admitting: Anesthesiology

## 2014-10-01 ENCOUNTER — Encounter (HOSPITAL_COMMUNITY): Payer: Self-pay

## 2014-10-01 DIAGNOSIS — Z981 Arthrodesis status: Secondary | ICD-10-CM | POA: Insufficient documentation

## 2014-10-01 DIAGNOSIS — I10 Essential (primary) hypertension: Secondary | ICD-10-CM | POA: Diagnosis not present

## 2014-10-01 DIAGNOSIS — Z87891 Personal history of nicotine dependence: Secondary | ICD-10-CM | POA: Diagnosis not present

## 2014-10-01 DIAGNOSIS — N281 Cyst of kidney, acquired: Secondary | ICD-10-CM | POA: Insufficient documentation

## 2014-10-01 DIAGNOSIS — J45909 Unspecified asthma, uncomplicated: Secondary | ICD-10-CM | POA: Diagnosis not present

## 2014-10-01 DIAGNOSIS — Z01818 Encounter for other preprocedural examination: Secondary | ICD-10-CM | POA: Insufficient documentation

## 2014-10-01 DIAGNOSIS — J449 Chronic obstructive pulmonary disease, unspecified: Secondary | ICD-10-CM | POA: Insufficient documentation

## 2014-10-01 HISTORY — DX: Inflammatory liver disease, unspecified: K75.9

## 2014-10-01 HISTORY — DX: Dermatitis, unspecified: L30.9

## 2014-10-01 HISTORY — DX: Other seasonal allergic rhinitis: J30.2

## 2014-10-01 HISTORY — DX: Personal history of urinary calculi: Z87.442

## 2014-10-01 LAB — BASIC METABOLIC PANEL
Anion gap: 8 (ref 5–15)
BUN: 18 mg/dL (ref 6–23)
CO2: 30 mmol/L (ref 19–32)
Calcium: 9.2 mg/dL (ref 8.4–10.5)
Chloride: 99 mEq/L (ref 96–112)
Creatinine, Ser: 1.12 mg/dL (ref 0.50–1.35)
GFR calc Af Amer: 75 mL/min — ABNORMAL LOW (ref >90)
GFR calc non Af Amer: 65 mL/min — ABNORMAL LOW (ref >90)
Glucose, Bld: 93 mg/dL (ref 70–99)
Potassium: 3.3 mmol/L — ABNORMAL LOW (ref 3.5–5.1)
Sodium: 137 mmol/L (ref 135–145)

## 2014-10-01 LAB — CBC
HCT: 33.9 % — ABNORMAL LOW (ref 39.0–52.0)
Hemoglobin: 11.2 g/dL — ABNORMAL LOW (ref 13.0–17.0)
MCH: 31.7 pg (ref 26.0–34.0)
MCHC: 33 g/dL (ref 30.0–36.0)
MCV: 96 fL (ref 78.0–100.0)
Platelets: 386 10*3/uL (ref 150–400)
RBC: 3.53 MIL/uL — ABNORMAL LOW (ref 4.22–5.81)
RDW: 13.8 % (ref 11.5–15.5)
WBC: 7.5 10*3/uL (ref 4.0–10.5)

## 2014-10-01 NOTE — Progress Notes (Signed)
Blood Bank stated tube of blood for type and screen had incorrect labelling per Blood Bank.  New order put in for type and screen in EPIC .  Patient called and was informed he could remove blue bracelet and that new tube of blood would be obtained day of surgery and new bracelet placed on day of surgery.  Patient voiced understanding.

## 2014-10-01 NOTE — Patient Instructions (Signed)
Rizwan KALADIN NOSEWORTHY  10/01/2014   Your procedure is scheduled on: Wednesday 10/08/14  Report to [facility]  Entrance and follow signs to               [place] at 10:45 AM.  Call this number if you have problems the morning of surgery [phone]   Remember: Please bring inhaler on day of surgery.   Do not eat food or drink liquids :After Midnight.     Take these medicines the morning of surgery with A SIP OF WATER: inhaler, flonase                               You may not have any metal on your body including hair pins and              piercings  Do not wear jewelry, make-up, lotions, powders or perfumes.             Do not wear nail polish.  Do not shave  48 hours prior to surgery.              Men may shave face and neck.  Do not bring valuables to the hospital. [place].  Contacts, dentures or bridgework may not be worn into surgery.  Leave suitcase in the car. After surgery it may be brought to your room.   _____________________________________________________________________             [facility] - Preparing for Surgery Before surgery, you can play an important role.  Because skin is not sterile, your skin needs to be as free of germs as possible.  You can reduce the number of germs on your skin by washing with CHG (chlorahexidine gluconate) soap before surgery.  CHG is an antiseptic cleaner which kills germs and bonds with the skin to continue killing germs even after washing. Please DO NOT use if you have an allergy to CHG or antibacterial soaps.  If your skin becomes reddened/irritated stop using the CHG and inform your nurse when you arrive at Short Stay. Do not shave (including legs and underarms) for at least 48 hours prior to the first CHG shower.  You may shave your face/neck. Please follow these instructions carefully:  1.  Shower with CHG Soap the night before surgery and the  morning of  Surgery.  2.  If you choose to wash your hair, wash your hair first as usual with your  normal  shampoo.  3.  After you shampoo, rinse your hair and body thoroughly to remove the  shampoo.                            4.  Use CHG as you would any other liquid soap.  You can apply chg directly  to the skin and wash                       Gently with a scrungie or clean washcloth.  5.  Apply the CHG Soap to your body ONLY FROM THE NECK DOWN.   Do not use on face/ open  Wound or open sores. Avoid contact with eyes, ears mouth and genitals (private parts).                       Wash face,  Genitals (private parts) with your normal soap.             6.  Wash thoroughly, paying special attention to the area where your surgery  will be performed.  7.  Thoroughly rinse your body with warm water from the neck down.  8.  DO NOT shower/wash with your normal soap after using and rinsing off  the CHG Soap.                9.  Pat yourself dry with a clean towel.            10.  Wear clean pajamas.            11.  Place clean sheets on your bed the night of your first shower and do not  sleep with pets. Day of Surgery : Do not apply any lotions/deodorants the morning of surgery.  Please wear clean clothes to the hospital/surgery center.  FAILURE TO FOLLOW THESE INSTRUCTIONS MAY RESULT IN THE CANCELLATION OF YOUR SURGERY PATIENT SIGNATURE_________________________________  NURSE SIGNATURE__________________________________  ________________________________________________________________________

## 2014-10-01 NOTE — Progress Notes (Signed)
BMP results done 10/01/2014 faxed via EPIC to Dr Tresa Moore.

## 2014-10-06 DIAGNOSIS — L209 Atopic dermatitis, unspecified: Secondary | ICD-10-CM | POA: Diagnosis not present

## 2014-10-06 DIAGNOSIS — L281 Prurigo nodularis: Secondary | ICD-10-CM | POA: Diagnosis not present

## 2014-10-08 ENCOUNTER — Inpatient Hospital Stay (HOSPITAL_COMMUNITY): Payer: Medicare Other | Admitting: Anesthesiology

## 2014-10-08 ENCOUNTER — Inpatient Hospital Stay (HOSPITAL_COMMUNITY)
Admission: RE | Admit: 2014-10-08 | Discharge: 2014-10-10 | DRG: 661 | Disposition: A | Discharge status: Home / Self Care | Payer: Medicare Other | Source: Ambulatory Visit | Attending: Urology | Admitting: Urology

## 2014-10-08 ENCOUNTER — Encounter (HOSPITAL_COMMUNITY): Payer: Self-pay | Admitting: *Deleted

## 2014-10-08 ENCOUNTER — Encounter (HOSPITAL_COMMUNITY)
Admission: RE | Disposition: A | Discharge status: Home / Self Care | Payer: Self-pay | Source: Ambulatory Visit | Attending: Urology

## 2014-10-08 DIAGNOSIS — C641 Malignant neoplasm of right kidney, except renal pelvis: Secondary | ICD-10-CM | POA: Diagnosis not present

## 2014-10-08 DIAGNOSIS — Z882 Allergy status to sulfonamides status: Secondary | ICD-10-CM

## 2014-10-08 DIAGNOSIS — J45909 Unspecified asthma, uncomplicated: Secondary | ICD-10-CM | POA: Diagnosis present

## 2014-10-08 DIAGNOSIS — F1290 Cannabis use, unspecified, uncomplicated: Secondary | ICD-10-CM | POA: Diagnosis present

## 2014-10-08 DIAGNOSIS — L409 Psoriasis, unspecified: Secondary | ICD-10-CM | POA: Diagnosis present

## 2014-10-08 DIAGNOSIS — L309 Dermatitis, unspecified: Secondary | ICD-10-CM | POA: Diagnosis present

## 2014-10-08 DIAGNOSIS — C649 Malignant neoplasm of unspecified kidney, except renal pelvis: Secondary | ICD-10-CM | POA: Diagnosis not present

## 2014-10-08 DIAGNOSIS — I10 Essential (primary) hypertension: Secondary | ICD-10-CM | POA: Diagnosis present

## 2014-10-08 DIAGNOSIS — Z9889 Other specified postprocedural states: Secondary | ICD-10-CM | POA: Diagnosis not present

## 2014-10-08 DIAGNOSIS — M810 Age-related osteoporosis without current pathological fracture: Secondary | ICD-10-CM | POA: Diagnosis present

## 2014-10-08 DIAGNOSIS — N2889 Other specified disorders of kidney and ureter: Secondary | ICD-10-CM | POA: Diagnosis present

## 2014-10-08 DIAGNOSIS — Z888 Allergy status to other drugs, medicaments and biological substances status: Secondary | ICD-10-CM | POA: Diagnosis not present

## 2014-10-08 DIAGNOSIS — R338 Other retention of urine: Secondary | ICD-10-CM | POA: Diagnosis present

## 2014-10-08 DIAGNOSIS — M1990 Unspecified osteoarthritis, unspecified site: Secondary | ICD-10-CM | POA: Diagnosis present

## 2014-10-08 DIAGNOSIS — N289 Disorder of kidney and ureter, unspecified: Secondary | ICD-10-CM | POA: Diagnosis present

## 2014-10-08 DIAGNOSIS — H5441 Blindness, right eye, normal vision left eye: Secondary | ICD-10-CM | POA: Diagnosis present

## 2014-10-08 DIAGNOSIS — K759 Inflammatory liver disease, unspecified: Secondary | ICD-10-CM | POA: Diagnosis present

## 2014-10-08 DIAGNOSIS — Z87891 Personal history of nicotine dependence: Secondary | ICD-10-CM

## 2014-10-08 DIAGNOSIS — Z885 Allergy status to narcotic agent status: Secondary | ICD-10-CM

## 2014-10-08 DIAGNOSIS — N401 Enlarged prostate with lower urinary tract symptoms: Secondary | ICD-10-CM | POA: Diagnosis present

## 2014-10-08 DIAGNOSIS — Z87442 Personal history of urinary calculi: Secondary | ICD-10-CM

## 2014-10-08 DIAGNOSIS — N281 Cyst of kidney, acquired: Secondary | ICD-10-CM | POA: Diagnosis not present

## 2014-10-08 HISTORY — PX: ROBOTIC ASSITED PARTIAL NEPHRECTOMY: SHX6087

## 2014-10-08 LAB — ABO/RH: ABO/RH(D): O POS

## 2014-10-08 LAB — HEMOGLOBIN AND HEMATOCRIT, BLOOD
HCT: 31.1 % — ABNORMAL LOW (ref 39.0–52.0)
Hemoglobin: 10.3 g/dL — ABNORMAL LOW (ref 13.0–17.0)

## 2014-10-08 SURGERY — ROBOTIC ASSITED PARTIAL NEPHRECTOMY
Anesthesia: General | Laterality: Right

## 2014-10-08 MED ORDER — FINASTERIDE 5 MG PO TABS
5.0000 mg | ORAL_TABLET | Freq: Every day | ORAL | Status: DC
  Administered 2014-10-08 – 2014-10-10 (×3): 5 mg via ORAL
  Filled 2014-10-08 (×3): qty 1

## 2014-10-08 MED ORDER — HYDROCHLOROTHIAZIDE 12.5 MG PO CAPS
12.5000 mg | ORAL_CAPSULE | Freq: Two times a day (BID) | ORAL | Status: DC
  Administered 2014-10-08 – 2014-10-10 (×4): 12.5 mg via ORAL
  Filled 2014-10-08 (×5): qty 1

## 2014-10-08 MED ORDER — LACTATED RINGERS IV SOLN: INTRAVENOUS | Status: DC

## 2014-10-08 MED ORDER — DEXAMETHASONE SODIUM PHOSPHATE 10 MG/ML IJ SOLN
INTRAMUSCULAR | Status: DC | PRN
  Administered 2014-10-08: 10 mg via INTRAVENOUS

## 2014-10-08 MED ORDER — PROPOFOL 10 MG/ML IV BOLUS
INTRAVENOUS | Status: AC
  Filled 2014-10-08: qty 20

## 2014-10-08 MED ORDER — LACTATED RINGERS IV SOLN
INTRAVENOUS | Status: DC | PRN
  Administered 2014-10-08: 13:00:00 via INTRAVENOUS

## 2014-10-08 MED ORDER — SODIUM CHLORIDE 0.9 % IJ SOLN
INTRAMUSCULAR | Status: AC
  Filled 2014-10-08: qty 20

## 2014-10-08 MED ORDER — CEFAZOLIN SODIUM-DEXTROSE 2-3 GM-% IV SOLR
INTRAVENOUS | Status: AC
  Filled 2014-10-08: qty 50

## 2014-10-08 MED ORDER — DEXAMETHASONE SODIUM PHOSPHATE 10 MG/ML IJ SOLN
INTRAMUSCULAR | Status: AC
  Filled 2014-10-08: qty 1

## 2014-10-08 MED ORDER — METOCLOPRAMIDE HCL 5 MG/ML IJ SOLN
INTRAMUSCULAR | Status: AC
  Filled 2014-10-08: qty 2

## 2014-10-08 MED ORDER — HYDROMORPHONE HCL 1 MG/ML IJ SOLN
0.5000 mg | INTRAMUSCULAR | Status: DC | PRN
  Administered 2014-10-08: 0.5 mg via INTRAVENOUS
  Administered 2014-10-08 – 2014-10-10 (×9): 1 mg via INTRAVENOUS
  Filled 2014-10-08 (×10): qty 1

## 2014-10-08 MED ORDER — ALBUTEROL SULFATE (2.5 MG/3ML) 0.083% IN NEBU: 2.5000 mg | INHALATION_SOLUTION | Freq: Four times a day (QID) | RESPIRATORY_TRACT | Status: DC | PRN

## 2014-10-08 MED ORDER — HYDROMORPHONE HCL 1 MG/ML IJ SOLN
0.2500 mg | INTRAMUSCULAR | Status: DC | PRN
  Administered 2014-10-08 (×2): 0.5 mg via INTRAVENOUS

## 2014-10-08 MED ORDER — FLUTICASONE PROPIONATE 50 MCG/ACT NA SUSP
1.0000 | Freq: Every day | NASAL | Status: DC
  Filled 2014-10-08: qty 16

## 2014-10-08 MED ORDER — GLYCOPYRROLATE 0.2 MG/ML IJ SOLN
INTRAMUSCULAR | Status: AC
  Filled 2014-10-08: qty 1

## 2014-10-08 MED ORDER — ONDANSETRON HCL 4 MG/2ML IJ SOLN
INTRAMUSCULAR | Status: AC
  Filled 2014-10-08: qty 2

## 2014-10-08 MED ORDER — HYDROMORPHONE HCL 2 MG PO TABS
2.0000 mg | ORAL_TABLET | ORAL | Status: DC | PRN
  Administered 2014-10-09 – 2014-10-10 (×4): 2 mg via ORAL
  Filled 2014-10-08 (×4): qty 1

## 2014-10-08 MED ORDER — GLYCOPYRROLATE 0.2 MG/ML IJ SOLN
INTRAMUSCULAR | Status: DC | PRN
  Administered 2014-10-08: 0.6 mg via INTRAVENOUS

## 2014-10-08 MED ORDER — HYDROMORPHONE HCL 1 MG/ML IJ SOLN
INTRAMUSCULAR | Status: AC
  Filled 2014-10-08: qty 1

## 2014-10-08 MED ORDER — CETYLPYRIDINIUM CHLORIDE 0.05 % MT LIQD
7.0000 mL | Freq: Two times a day (BID) | OROMUCOSAL | Status: DC
  Administered 2014-10-08 – 2014-10-09 (×2): 7 mL via OROMUCOSAL

## 2014-10-08 MED ORDER — MANNITOL 25 % IV SOLN
25.0000 g | Freq: Once | INTRAVENOUS | Status: AC
  Administered 2014-10-08 (×2): 12.5 g via INTRAVENOUS
  Filled 2014-10-08: qty 100

## 2014-10-08 MED ORDER — DEXTROSE-NACL 5-0.45 % IV SOLN
INTRAVENOUS | Status: DC
  Administered 2014-10-08 – 2014-10-09 (×3): via INTRAVENOUS

## 2014-10-08 MED ORDER — GLYCOPYRROLATE 0.2 MG/ML IJ SOLN
INTRAMUSCULAR | Status: AC
  Filled 2014-10-08: qty 3

## 2014-10-08 MED ORDER — CISATRACURIUM BESYLATE (PF) 10 MG/5ML IV SOLN
INTRAVENOUS | Status: DC | PRN
  Administered 2014-10-08: 4 mg via INTRAVENOUS
  Administered 2014-10-08: 9 mg via INTRAVENOUS
  Administered 2014-10-08: 2 mg via INTRAVENOUS

## 2014-10-08 MED ORDER — BUPIVACAINE LIPOSOME 1.3 % IJ SUSP
20.0000 mL | Freq: Once | INTRAMUSCULAR | Status: AC
  Administered 2014-10-08: 20 mL
  Filled 2014-10-08: qty 20

## 2014-10-08 MED ORDER — PROPOFOL 10 MG/ML IV BOLUS
INTRAVENOUS | Status: DC | PRN
  Administered 2014-10-08: 160 mg via INTRAVENOUS

## 2014-10-08 MED ORDER — HYDROMORPHONE HCL 1 MG/ML IJ SOLN
INTRAMUSCULAR | Status: DC | PRN
  Administered 2014-10-08 (×2): 1 mg via INTRAVENOUS

## 2014-10-08 MED ORDER — MOMETASONE FURO-FORMOTEROL FUM 200-5 MCG/ACT IN AERO
2.0000 | INHALATION_SPRAY | Freq: Two times a day (BID) | RESPIRATORY_TRACT | Status: DC
  Filled 2014-10-08: qty 8.8

## 2014-10-08 MED ORDER — CISATRACURIUM BESYLATE 20 MG/10ML IV SOLN
INTRAVENOUS | Status: AC
  Filled 2014-10-08: qty 10

## 2014-10-08 MED ORDER — PNEUMOCOCCAL VAC POLYVALENT 25 MCG/0.5ML IJ INJ
0.5000 mL | INJECTION | INTRAMUSCULAR | Status: DC
  Filled 2014-10-08 (×2): qty 0.5

## 2014-10-08 MED ORDER — LACTATED RINGERS IR SOLN
Status: DC | PRN
  Administered 2014-10-08: 1000 mL

## 2014-10-08 MED ORDER — NEOSTIGMINE METHYLSULFATE 10 MG/10ML IV SOLN
INTRAVENOUS | Status: DC | PRN
  Administered 2014-10-08: 4 mg via INTRAVENOUS

## 2014-10-08 MED ORDER — FENTANYL CITRATE 0.05 MG/ML IJ SOLN
INTRAMUSCULAR | Status: AC
  Filled 2014-10-08: qty 5

## 2014-10-08 MED ORDER — CYCLOSPORINE MODIFIED (NEORAL) 100 MG PO CAPS
100.0000 mg | ORAL_CAPSULE | Freq: Two times a day (BID) | ORAL | Status: DC
  Administered 2014-10-08 – 2014-10-10 (×2): 100 mg via ORAL
  Filled 2014-10-08 (×5): qty 1

## 2014-10-08 MED ORDER — HYDROMORPHONE HCL 2 MG/ML IJ SOLN
INTRAMUSCULAR | Status: AC
  Filled 2014-10-08: qty 1

## 2014-10-08 MED ORDER — ONDANSETRON HCL 4 MG/2ML IJ SOLN
INTRAMUSCULAR | Status: DC | PRN
  Administered 2014-10-08: 4 mg via INTRAVENOUS

## 2014-10-08 MED ORDER — METOCLOPRAMIDE HCL 5 MG/ML IJ SOLN
INTRAMUSCULAR | Status: DC | PRN
  Administered 2014-10-08: 10 mg via INTRAVENOUS

## 2014-10-08 MED ORDER — CEFAZOLIN SODIUM-DEXTROSE 2-3 GM-% IV SOLR
2.0000 g | INTRAVENOUS | Status: AC
  Administered 2014-10-08: 2 g via INTRAVENOUS

## 2014-10-08 MED ORDER — LACTATED RINGERS IV SOLN
INTRAVENOUS | Status: DC
  Administered 2014-10-08: 1000 mL via INTRAVENOUS
  Administered 2014-10-08 (×2): via INTRAVENOUS

## 2014-10-08 MED ORDER — FENTANYL CITRATE 0.05 MG/ML IJ SOLN
INTRAMUSCULAR | Status: DC | PRN
  Administered 2014-10-08 (×2): 100 ug via INTRAVENOUS
  Administered 2014-10-08: 150 ug via INTRAVENOUS

## 2014-10-08 MED ORDER — SUFENTANIL CITRATE 50 MCG/ML IV SOLN
INTRAVENOUS | Status: AC
  Filled 2014-10-08: qty 1

## 2014-10-08 SURGICAL SUPPLY — 72 items
APPLICATOR SURGIFLO ENDO (HEMOSTASIS) ×3 IMPLANT
CABLE HIGH FREQUENCY MONO STRZ (ELECTRODE) IMPLANT
CHLORAPREP W/TINT 26ML (MISCELLANEOUS) ×3 IMPLANT
CLIP LIGATING HEM O LOK PURPLE (MISCELLANEOUS) ×6 IMPLANT
CLIP LIGATING HEMO LOK XL GOLD (MISCELLANEOUS) IMPLANT
CLIP LIGATING HEMO O LOK GREEN (MISCELLANEOUS) ×9 IMPLANT
CLIP SUT LAPRA TY ABSORB (SUTURE) ×9 IMPLANT
CORDS BIPOLAR (ELECTRODE) IMPLANT
COVER SURGICAL LIGHT HANDLE (MISCELLANEOUS) IMPLANT
CUTTER ECHEON FLEX ENDO 45 340 (ENDOMECHANICALS) IMPLANT
DECANTER SPIKE VIAL GLASS SM (MISCELLANEOUS) ×3 IMPLANT
DRAIN CHANNEL 15F RND FF 3/16 (WOUND CARE) IMPLANT
DRAPE ARM DVNC X/XI (DISPOSABLE) ×4 IMPLANT
DRAPE COLUMN DVNC XI (DISPOSABLE) ×1 IMPLANT
DRAPE DA VINCI XI ARM (DISPOSABLE) ×8
DRAPE DA VINCI XI COLUMN (DISPOSABLE) ×2
DRAPE INCISE IOBAN 66X45 STRL (DRAPES) ×3 IMPLANT
DRAPE LAPAROSCOPIC ABDOMINAL (DRAPES) ×3 IMPLANT
DRAPE SHEET LG 3/4 BI-LAMINATE (DRAPES) ×6 IMPLANT
DRAPE TABLE BACK 44X90 PK DISP (DRAPES) IMPLANT
DRAPE WARM FLUID 44X44 (DRAPE) ×3 IMPLANT
ELECT REM PT RETURN 9FT ADLT (ELECTROSURGICAL) ×3
ELECTRODE REM PT RTRN 9FT ADLT (ELECTROSURGICAL) ×1 IMPLANT
EVACUATOR SILICONE 100CC (DRAIN) ×3 IMPLANT
GLOVE BIO SURGEON STRL SZ 6.5 (GLOVE) ×2 IMPLANT
GLOVE BIO SURGEONS STRL SZ 6.5 (GLOVE) ×1
GLOVE BIOGEL M STRL SZ7.5 (GLOVE) ×9 IMPLANT
GOWN STRL REUS W/TWL LRG LVL3 (GOWN DISPOSABLE) ×9 IMPLANT
GOWN STRL REUS W/TWL XL LVL3 (GOWN DISPOSABLE) ×3 IMPLANT
HEMOSTAT SURGICEL 4X8 (HEMOSTASIS) ×3 IMPLANT
KIT ACCESSORY DA VINCI DISP (KITS)
KIT ACCESSORY DVNC DISP (KITS) IMPLANT
KIT BASIN OR (CUSTOM PROCEDURE TRAY) ×3 IMPLANT
LIQUID BAND (GAUZE/BANDAGES/DRESSINGS) ×3 IMPLANT
LOOP VESSEL MAXI BLUE (MISCELLANEOUS) ×3 IMPLANT
MARKER SKIN SURG 5.25 VIO NS (MISCELLANEOUS) ×3 IMPLANT
NEEDLE INSUFFLATION 14GA 120MM (NEEDLE) ×3 IMPLANT
NS IRRIG 1000ML POUR BTL (IV SOLUTION) ×3 IMPLANT
PENCIL BUTTON HOLSTER BLD 10FT (ELECTRODE) ×3 IMPLANT
POSITIONER SURGICAL ARM (MISCELLANEOUS) ×3 IMPLANT
POUCH SPECIMEN RETRIEVAL 10MM (ENDOMECHANICALS) ×3 IMPLANT
RELOAD WH ECHELON 45 (STAPLE) IMPLANT
SCISSORS LAP 5X35 DISP (ENDOMECHANICALS) ×3 IMPLANT
SEAL CANN UNIV 5-8 DVNC XI (MISCELLANEOUS) ×4 IMPLANT
SEAL XI 5MM-8MM UNIVERSAL (MISCELLANEOUS) ×8
SET TUBE IRRIG SUCTION NO TIP (IRRIGATION / IRRIGATOR) ×3 IMPLANT
SOLUTION ANTI FOG 6CC (MISCELLANEOUS) ×3 IMPLANT
SOLUTION ELECTROLUBE (MISCELLANEOUS) ×3 IMPLANT
SPONGE LAP 4X18 X RAY DECT (DISPOSABLE) IMPLANT
SURGIFLO W/THROMBIN 8M KIT (HEMOSTASIS) IMPLANT
SUT ETHILON 3 0 PS 1 (SUTURE) ×3 IMPLANT
SUT MNCRL AB 4-0 PS2 18 (SUTURE) ×6 IMPLANT
SUT PDS AB 1 CT1 27 (SUTURE) ×9 IMPLANT
SUT V-LOC BARB 180 2/0GR6 GS22 (SUTURE)
SUT VIC AB 1 CT1 27 (SUTURE) ×2
SUT VIC AB 1 CT1 27XBRD ANTBC (SUTURE) ×1 IMPLANT
SUT VIC AB 1 CT1 36 (SUTURE) ×15 IMPLANT
SUT VIC AB 2-0 SH 27 (SUTURE) ×4
SUT VIC AB 2-0 SH 27X BRD (SUTURE) ×2 IMPLANT
SUT VLOC BARB 180 ABS3/0GR12 (SUTURE) ×15
SUTURE V-LC BRB 180 2/0GR6GS22 (SUTURE) IMPLANT
SUTURE VLOC BRB 180 ABS3/0GR12 (SUTURE) ×5 IMPLANT
TOWEL OR 17X26 10 PK STRL BLUE (TOWEL DISPOSABLE) ×6 IMPLANT
TOWEL OR NON WOVEN STRL DISP B (DISPOSABLE) ×6 IMPLANT
TRAY FOLEY CATH 14FRSI W/METER (CATHETERS) ×3 IMPLANT
TRAY LAPAROSCOPIC (CUSTOM PROCEDURE TRAY) ×3 IMPLANT
TROCAR 12M 150ML BLUNT (TROCAR) IMPLANT
TROCAR BLADELESS OPT 5 75 (ENDOMECHANICALS) ×3 IMPLANT
TROCAR UNIVERSAL OPT 12M 100M (ENDOMECHANICALS) ×3 IMPLANT
TROCAR XCEL 12X100 BLDLESS (ENDOMECHANICALS) ×3 IMPLANT
TUBING INSUFFLATION 10FT LAP (TUBING) ×3 IMPLANT
WATER STERILE IRR 1500ML POUR (IV SOLUTION) IMPLANT

## 2014-10-08 NOTE — Transfer of Care (Signed)
Immediate Anesthesia Transfer of Care Note  Patient: Brian Leon  Procedure(s) Performed: Procedure(s) (LRB): ROBOTIC ASSITED PARTIAL NEPHRECTOMY  (Right)  Patient Location: PACU  Anesthesia Type: General  Level of Consciousness: sedated, patient cooperative and responds to stimulation  Airway & Oxygen Therapy: Patient Spontanous Breathing and Patient connected to face mask oxgen  Post-op Assessment: Report given to PACU RN and Post -op Vital signs reviewed and stable  Post vital signs: Reviewed and stable  Complications: No apparent anesthesia complications

## 2014-10-08 NOTE — Discharge Instructions (Signed)

## 2014-10-08 NOTE — Brief Op Note (Signed)
10/08/2014  5:00 PM  PATIENT:  Brian Leon  69 y.o. male  PRE-OPERATIVE DIAGNOSIS:  LARGE RIGHT RENAL MASS  POST-OPERATIVE DIAGNOSIS:  LARGE RIGHT RENAL MASS  PROCEDURE:  Procedure(s): ROBOTIC ASSITED PARTIAL NEPHRECTOMY  (Right)  SURGEON:  Surgeon(s) and Role:    * Alexis Frock, MD - Primary  PHYSICIAN ASSISTANT:   ASSISTANTS: 1 - Clemetine Marker PA, 2 - Park Liter MD   ANESTHESIA:   general  EBL:  Total I/O In: 2200 [I.V.:2200] Out: 200 [Urine:200]  BLOOD ADMINISTERED:none  DRAINS: 1 - JP to bulb suction, 2- foley to straight drain   LOCAL MEDICATIONS USED:  MARCAINE     SPECIMEN:  Source of Specimen:  Rt renal mass  DISPOSITION OF SPECIMEN:  PATHOLOGY  COUNTS:  YES  TOURNIQUET:  * No tourniquets in log *  DICTATION: .Other Dictation: Dictation Number [id number]  PLAN OF CARE: Admit to inpatient   PATIENT DISPOSITION:  PACU - hemodynamically stable.   Delay start of Pharmacological VTE agent (>24hrs) due to surgical blood loss or risk of bleeding: yes

## 2014-10-08 NOTE — Anesthesia Procedure Notes (Signed)
Procedure Name: Intubation Date/Time: 10/08/2014 1:48 PM Performed by: Johnathan Hausen A Pre-anesthesia Checklist: Patient identified, Timeout performed, Emergency Drugs available, Suction available and Patient being monitored Patient Re-evaluated:Patient Re-evaluated prior to inductionOxygen Delivery Method: Circle system utilized and Simple face mask Preoxygenation: Pre-oxygenation with 100% oxygen Intubation Type: IV induction Ventilation: Mask ventilation without difficulty Laryngoscope Size: Mac and 4 Grade View: Grade II Tube type: Oral Tube size: 8.0 mm Number of attempts: 1 Placement Confirmation: ETT inserted through vocal cords under direct vision,  breath sounds checked- equal and bilateral and positive ETCO2 Secured at: 22 cm Tube secured with: Tape Dental Injury: Teeth and Oropharynx as per pre-operative assessment

## 2014-10-08 NOTE — Progress Notes (Signed)
MEDICATION RELATED CONSULT NOTE - INITIAL   Pharmacy Consult for Assistance with cyclosporine dose adjustment and monitoring after partial nephrectomy Indication: Severe psoriasis  Allergies  Allergen Reactions  . Acetaminophen Other (See Comments)    "upsets my stomach"  . Oxycodone Nausea Only  . Oxycodone-Acetaminophen Nausea Only  . Sulfa Drugs Cross Reactors Other (See Comments)    Flu like symptoms    Patient Measurements:   Recent weight: 64 kg on 10/02/15  Vital Signs: Temp: 98 F (36.7 C) (01/06 1715) Temp Source: Oral (01/06 1050) BP: 149/85 mmHg (01/06 1730) Pulse Rate: 96 (01/06 1735) Intake/Output from previous day:   Intake/Output from this shift: Total I/O In: 2600 [I.V.:2600] Out: 200 [Urine:200]  Labs: No results for input(s): WBC, HGB, HCT, PLT, APTT, CREATININE, LABCREA, CREATININE, CREAT24HRUR, MG, PHOS, ALBUMIN, PROT, ALBUMIN, AST, ALT, ALKPHOS, BILITOT, BILIDIR, IBILI in the last 72 hours. Estimated Creatinine Clearance: 55.9 mL/min (by C-G formula based on Cr of 1.12).   Microbiology: No results found for this or any previous visit (from the past 720 hour(s)).  Medical History: Past Medical History  Diagnosis Date  . Asthma     PFT 2009 showed mod to severe with good reversibility with albuterol  . Osteoporosis   . Hypertension   . Renal cyst, right     COMPLEX  . Bilateral ureteral calculi   . Blind right eye     WEARS PROSTHESIS  . Allergic rhinitis   . Paraureteric diverticulum     BILATERAL  . Arthritis   . History of bladder stone   . Seasonal allergies   . Eczema   . History of kidney stones   . Hepatitis     hx of  . Complication of anesthesia     "woke up before hernia surgery complete" 2012    Medications:  Prescriptions prior to admission  Medication Sig Dispense Refill Last Dose  . albuterol (PROVENTIL HFA;VENTOLIN HFA) 108 (90 BASE) MCG/ACT inhaler Inhale 1-2 puffs into the lungs every 6 (six) hours as needed for  wheezing or shortness of breath. 8.5 g 2 Past Week at Unknown time  . cycloSPORINE modified (NEORAL/GENGRAF) 100 MG capsule Take 100 mg by mouth 2 (two) times daily.    10/07/2014 at 2200  . finasteride (PROSCAR) 5 MG tablet Take 5 mg by mouth daily.   10/07/2014 at 1000  . fluticasone (FLONASE) 50 MCG/ACT nasal spray Place 1 spray into both nostrils daily.   10/08/2014 at 0900  . Fluticasone-Salmeterol (ADVAIR) 500-50 MCG/DOSE AEPB Inhale 1 puff into the lungs 2 (two) times daily. 60 each 11 10/08/2014 at 0900  . hydrochlorothiazide (MICROZIDE) 12.5 MG capsule Take 12.5 mg by mouth 2 (two) times daily.   10/07/2014 at 2200  . trifluoperazine (STELAZINE) 1 MG tablet Take 1-2 mg by mouth as needed.    10/07/2014 at 2200  . ADVAIR DISKUS 500-50 MCG/DOSE AEPB INHALE 1 PUFF INTO THE LUNGS TWICE A DAY (Patient not taking: Reported on 09/24/2014) 60 each 2   . fluticasone (FLONASE) 50 MCG/ACT nasal spray PLACE 1 SPRAY INTO BOTH NOSTRILS DAILY. (Patient not taking: Reported on 09/24/2014) 16 g 6   . hydrochlorothiazide (MICROZIDE) 12.5 MG capsule TAKE 1 CAPSULE (12.5 MG TOTAL) BY MOUTH 2 (TWO) TIMES DAILY. (Patient not taking: Reported on 09/24/2014) 60 capsule 1   . oxyCODONE (OXY IR/ROXICODONE) 5 MG immediate release tablet Take 1 tablet (5 mg total) by mouth every 4 (four) hours as needed for severe pain. (Patient not taking:  Reported on 09/24/2014) 15 tablet 0   . tamsulosin (FLOMAX) 0.4 MG CAPS capsule Take 1 capsule (0.4 mg total) by mouth daily. For Urination. (Patient not taking: Reported on 09/24/2014) 30 capsule 11 Taking    Assessment: 69 yo male s/p partial nephrectomy on 10/08/14.  He takes modified cyclosporine (Neoral brand) 100mg  PO BID for severe psoriasis.  Pharmacy has been consulted by Urology to assist with dosing modification after surgery.  Because the terminal half life is 8.4 hours (range: 5-18 hours) - would not make any adjustments immediately. Recommendation would be to recheck level at new  steady state (within 5 days) and decrease dose if necessary.  Goal of Therapy:  General range of 100-400 ng/mL Avoidance of toxicity   Plan:   Continue current dosage of cyclosporine (Neoral brand) 100mg  BID  Obtain cyclosporine level in AM to establish baseline  Recheck level at new post-op steady state within 5 days and decrease dose if necessary - can be made in 25mg  increments  It is also recommended to increase the frequency of blood pressure monitoring after each alteration in dosage of cyclosporine.  Peggyann Juba, PharmD, BCPS Pager: [phone] 10/08/2014,5:39 PM
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
## 2014-10-08 NOTE — Anesthesia Preprocedure Evaluation (Signed)
Anesthesia Evaluation  Patient identified by MRN, date of birth, ID band Patient awake    Reviewed: Allergy & Precautions, NPO status , Patient's Chart, lab work & pertinent test results  History of Anesthesia Complications Negative for: history of anesthetic complications  Airway Mallampati: II  TM Distance: >3 FB Neck ROM: Full    Dental  (+) Teeth Intact, Implants,    Pulmonary asthma , neg sleep apnea, neg recent URI, former smoker,  breath sounds clear to auscultation        Cardiovascular hypertension, Pt. on medications - Past MI, - CHF and - DOE Rhythm:Regular     Neuro/Psych  Neuromuscular disease negative psych ROS   GI/Hepatic negative GI ROS, (+) Hepatitis -, Unspecified  Endo/Other  negative endocrine ROS  Renal/GU Renal disease     Musculoskeletal   Abdominal   Peds  Hematology  (+) anemia ,   Anesthesia Other Findings   Reproductive/Obstetrics                             Anesthesia Physical Anesthesia Plan  ASA: III  Anesthesia Plan: General   Post-op Pain Management:    Induction: Intravenous  Airway Management Planned: Oral ETT  Additional Equipment: None  Intra-op Plan:   Post-operative Plan: Extubation in OR  Informed Consent: I have reviewed the patients History and Physical, chart, labs and discussed the procedure including the risks, benefits and alternatives for the proposed anesthesia with the patient or authorized representative who has indicated his/her understanding and acceptance.   Dental advisory given  Plan Discussed with: CRNA and Surgeon  Anesthesia Plan Comments:         Anesthesia Quick Evaluation

## 2014-10-09 ENCOUNTER — Encounter (HOSPITAL_COMMUNITY): Payer: Self-pay | Admitting: Urology

## 2014-10-09 LAB — BASIC METABOLIC PANEL
Anion gap: 7 (ref 5–15)
BUN: 25 mg/dL — ABNORMAL HIGH (ref 6–23)
CO2: 27 mmol/L (ref 19–32)
Calcium: 8.5 mg/dL (ref 8.4–10.5)
Chloride: 103 mEq/L (ref 96–112)
Creatinine, Ser: 1.5 mg/dL — ABNORMAL HIGH (ref 0.50–1.35)
GFR calc Af Amer: 53 mL/min — ABNORMAL LOW (ref >90)
GFR calc non Af Amer: 45 mL/min — ABNORMAL LOW (ref >90)
Glucose, Bld: 190 mg/dL — ABNORMAL HIGH (ref 70–99)
Potassium: 4.2 mmol/L (ref 3.5–5.1)
Sodium: 137 mmol/L (ref 135–145)

## 2014-10-09 LAB — PREPARE RBC (CROSSMATCH)

## 2014-10-09 LAB — CYCLOSPORINE LEVEL: Cyclosporine: 110 ng/mL — ABNORMAL LOW (ref 140–330)

## 2014-10-09 LAB — HEMOGLOBIN AND HEMATOCRIT, BLOOD
HCT: 26.4 % — ABNORMAL LOW (ref 39.0–52.0)
Hemoglobin: 8.9 g/dL — ABNORMAL LOW (ref 13.0–17.0)

## 2014-10-09 MED ORDER — ONDANSETRON HCL 4 MG/2ML IJ SOLN
4.0000 mg | INTRAMUSCULAR | Status: DC | PRN
  Administered 2014-10-09 (×3): 4 mg via INTRAVENOUS
  Filled 2014-10-09 (×3): qty 2

## 2014-10-09 NOTE — Care Management Note (Addendum)
    Page 1 of 1   10/10/2014     1:54:41 PM CARE MANAGEMENT NOTE 10/10/2014  Patient:  Brian Leon, Brian Leon   Account Number:  [id number]  Date Initiated:  10/09/2014  Documentation initiated by:  Dessa Phi  Subjective/Objective Assessment:   69 y/o m admitted w/renal mass.     Action/Plan:   From home.   Anticipated DC Date:  10/10/2014   Anticipated DC Plan:  [place]  CM consult      Choice offered to / List presented to:             Status of service:  In process, will continue to follow Medicare Important Message given?  YES (If response is "NO", the following Medicare IM given date fields will be blank) Date Medicare IM given:  10/10/2014 Medicare IM given by:  [facility] Date Additional Medicare IM given:   Additional Medicare IM given by:    Discharge Disposition:    Per UR Regulation:  Reviewed for med. necessity/level of care/duration of stay  If discussed at [place] of Stay Meetings, dates discussed:    Comments:  10/09/14 Dessa Phi RN BSN NCM [phone] Pod#1 Partial nephrectomy.No anticipated d/c needs.

## 2014-10-09 NOTE — Op Note (Signed)
NAMEESMERALDA, BLANFORD NO.:  [id number]  MEDICAL RECORD NO.:  [id number]  LOCATION:  67                         FACILITY:  [facility]  PHYSICIAN:  Alexis Frock, MD     DATE OF BIRTH:  [birth year]  DATE OF PROCEDURE: 10/08/2014 DATE OF DISCHARGE:                              OPERATIVE REPORT   DIAGNOSIS: 1. Large right renal mass. 2. History of nephrolithiasis.  PROCEDURE:  Robotic-assisted laparoscopic right partial nephrectomy.  ESTIMATED BLOOD LOSS:  100 mL.  ASSISTANTS: 1. Clemetine Marker, PA. 2. Jeneen Rinks "Jed" Glo Herring, MD.  DRAINS:  Jackson-Pratt drain to bulb suction.  FINDINGS: 1. Single artery, single vein, right renovascular anatomy. 2. Right mixed cystic solid inferior lateral renal mass.  INDICATION:  Mr. Becvar is a pleasant 69 year old gentleman with history of multiple urologic problems including outlet obstruction, significant bilateral recurrent nephrolithiasis who was found on axial imaging to have a complex right cystic renal mass.  This was initially managed with surveillance; however, on followup imaging, he was found to have significant interval growth as well as more enhancement of nodules within the mass. This was worrisome for cystic renal cell carcinoma.  He was stone free on most recent imaging.  Options were discussed with the patient including further surveillance versus ablative therapies versus surgical extirpation with and without nephron sparing and given his history nephrolithiasis bilaterally, we agreed to proceed with attempt at robotic partial nephrectomy versus radical if it does not look feasible.  Informed consent was obtained and placed in medical record.  PROCEDURE IN DETAIL:  The patient being Camden Guillette was verified. Procedure being right robotic partial versus radical nephrectomy was confirmed.  Procedure was carried out.  Time-out was performed. Intravenous antibiotics administered.  General endotracheal  anesthesia was introduced. The patient was placed into a right side up full flank position applying 20 degrees stable flexion, superior arm elevator, bean bag, and compression devices.  Bottom leg was bent, top leg was straight.  He was further fashioned on the operative table using 3-inch tape over foam padding.  A sterile field was created by prepping and draping the patient's entire right flank using chlorhexidine gluconate.  Next, a high-flow low pressure pneumoperitoneum was obtained using Veress technique in the right lower quadrant having passed the aspiration and drop test.  Next, a 12-mm robotic camera port was placed in a paramedian location, approximately 4 fingerbreadths superior lateral to the umbilicus.  Laparoscopic examination of peritoneal cavity revealed no significant adhesions and no visceral injury.  Liver was quite large. Next, a 5-mm subxiphoid port was placed through which a self-locking retractor was used to the place the liver in gentle superior traction to raise it off the anterior superior surface of the kidney.  An 8 mm robotic port was placed in the paramedian subxiphoid location superiorly.  An 8-mm robotic port in the inferior paramedian location and possible handbreadth inferior to the umbilicus, an 8-mm robotic port in a right far lateral position approximately 3 fingerbreadths superior to the anterior iliac spine, and a 12 mm assistant port in the midline 2 fingerbreadths above the umbilicus, and a 44-YJ assistant port in the midline 2 fingerbreadths below the umbilicus.  Robot was docked and passed through electronic checks.  Initial attention was directed to development of retroperitoneum.  Incision was made lateral to the ascending colon from the area of the internal ring towards the area of hepatic flexure.  The colon was carefully swept medially and lower pole of the kidney was found and placed on gentle lateral traction.  There was clear large mass  associated with this.  Section medial to this down the ureter which was also positively identified and placed on gentle lateral traction with the kidney.  The duodenum was encountered and Kocherized medially.  It was exposed with inferior vena cava which was carefully followed superiorly to the level of the right renal vein which was a Singleton in the right renal artery which was early branching but also Singleton, both these were marked with vessel loops.  The area of the presumed tumor was then de-fatted circumferentially allowing better demarcation of the tumor and normal parenchymal junction. The mass did appear amenable to attempt to the partial nephrectomy.  Mannitol was given intravenously.  The artery was doubly clamped and under warm ischemia.  Partial nephrectomy was performed using cold scissors giving what appeared to be a rim of normal renal parenchyma with the cystic renal mass specimen.  This was quite aggressive resection and resulted in significant inferior lateral renal defect.  First layer renography was performed using running 3-0 V-loc over-sewing several obvious injuries to the renal collecting system and several small obvious vessels; this was performed x2.  The artery was unclamped for total warm ischemia time of 20 minutes. Additionally renography was performed by placing a bolster into the partial nephrectomy bed and placing 1 parenchymal apposition suture in an interrupted fashion sandwiched between Hem-o-lok and Lapra-Ty x4 resulted in excellent hemostasis. Next, parenchymal apposition around the bolster and needles were all removed.  Sponge and needle counts were correct.  The Gerota's fascia was reapproximated using a running 3-0 V-loc x2 after Surgiflo was applied to the renal artery bed which again appeared to be of excellent hemostasis.  The ureter was inspected and found to be uninjured, thus loops were removed.  A closed suction drain was brought through  the previous right lateral most robotic port site near area of peritoneal cavity.  The specimen was placed into an EndoCatch bag for later retrieval.  Robot was then undocked.  Specimen retrieved by connecting the 2 previous 12 mm midline assistant port sites removing the partial nephrectomy specimen and setting it aside for pathology. This site was closed with fascia using figure-of-eight PDS x6 and Scarpa's using running Vicryl.  All incision sites were infiltrated with dilute lyophilized Marcaine and closed with skin using subcuticular Monocryl followed by Dermabond.  Procedure was terminated. The patient tolerated procedure well.  There were no immediate periprocedural complications. The patient was taken to the Postanesthesia Care Unit in stable condition.          ______________________________ Alexis Frock, MD     TM/MEDQ  D:  10/08/2014  T:  10/09/2014  Job:  [id number]

## 2014-10-09 NOTE — Progress Notes (Signed)
1 Day Post-Op  Subjective:   1 - Rt Cystic Renal Mass - s/p right robotic partial nephrectomy 10/08/14 for increasing size enhancing cystic / nodular renal mass. Non stented. Path pending. JP in place.  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 Now s/p partial nephrecotmy. Pre-op Cr 1.1 / GFR 65. POD 1 Cr 1.5 / GFR 45. Pharmacy consult assisting with dose modification and DC planning.   Today Brian Leon is w/o complaints. Foley removed this AM. One episode small emesis last PM, no nausea at present.   Objective: Vital signs in last 24 hours: Temp:  [97.5 F (36.4 C)-98.9 F (37.2 C)] 98.9 F (37.2 C) (01/07 0504) Pulse Rate:  [69-131] 72 (01/07 0504) Resp:  [5-20] 20 (01/07 0504) BP: (124-157)/(61-108) 124/73 mmHg (01/07 0504) SpO2:  [93 %-100 %] 97 % (01/07 0504) Weight:  [64.4 kg (141 lb 15.6 oz)] 64.4 kg (141 lb 15.6 oz) (01/06 1851)    Intake/Output from previous day: 01/06 0701 - 01/07 0700 In: 4489.6 [I.V.:4489.6] Out: 1070 [Urine:875; Drains:195] Intake/Output this shift:    General appearance: alert, cooperative and appears stated age Head: Normocephalic, without obvious abnormality, atraumatic Nose: Nares normal. Septum midline. Mucosa normal. No drainage or sinus tenderness. Throat: lips, mucosa, and tongue normal; teeth and gums normal Back: symmetric, no curvature. ROM normal. No CVA tenderness. Resp: non-labored on room air Cardio: Nl rate GI: soft, non-tender; bowel sounds normal; no masses,  no organomegaly Male genitalia: normal Extremities: extremities normal, atraumatic, no cyanosis or edema Pulses: 2+ and symmetric Skin: Skin color, texture, turgor normal. No rashes or lesions Lymph nodes: Cervical, supraclavicular, and axillary nodes normal. Neurologic: Grossly normal Incision/Wound: recent port / extraction sites c/d/i. Mild ecchymoses as expected w/o hematoma. JP with scant serosanguinous output.  Lab  Results:   Recent Labs  10/08/14 1730 10/09/14 0400  HGB 10.3* 8.9*  HCT 31.1* 26.4*   BMET  Recent Labs  10/09/14 0400  NA 137  K 4.2  CL 103  CO2 27  GLUCOSE 190*  BUN 25*  CREATININE 1.50*  CALCIUM 8.5   PT/INR No results for input(s): LABPROT, INR in the last 72 hours. ABG No results for input(s): PHART, HCO3 in the last 72 hours.  Invalid input(s): PCO2, PO2  Studies/Results: No results found.  Anti-infectives: Anti-infectives    Start     Dose/Rate Route Frequency Ordered Stop   10/08/14 1054  ceFAZolin (ANCEF) IVPB 2 g/50 mL premix     2 g100 mL/hr over 30 Minutes Intravenous 30 min pre-op 10/08/14 1054 10/08/14 1335      Assessment/Plan:  1 - Rt Cystic Renal Mass - Doing well POD 1. Ambulated, Adv to reg diet. T+C 2 u pRBC to have avail, but hgb drop slight and at this point appropriate and no s/s anemia. Recheck Hgb tomorrow.   2 - Renal Insufficiency / Cyclosporine Use - continue daily BMP, appreciate pharmacy help for in house dosing / monitoring and for DC planning.  [facility], [place]bold 10/09/2014

## 2014-10-10 LAB — BASIC METABOLIC PANEL
Anion gap: 8 (ref 5–15)
BUN: 19 mg/dL (ref 6–23)
CO2: 26 mmol/L (ref 19–32)
Calcium: 8.4 mg/dL (ref 8.4–10.5)
Chloride: 96 mEq/L (ref 96–112)
Creatinine, Ser: 1.38 mg/dL — ABNORMAL HIGH (ref 0.50–1.35)
GFR calc Af Amer: 58 mL/min — ABNORMAL LOW (ref >90)
GFR calc non Af Amer: 50 mL/min — ABNORMAL LOW (ref >90)
Glucose, Bld: 106 mg/dL — ABNORMAL HIGH (ref 70–99)
Potassium: 2.9 mmol/L — ABNORMAL LOW (ref 3.5–5.1)
Sodium: 130 mmol/L — ABNORMAL LOW (ref 135–145)

## 2014-10-10 LAB — HEMOGLOBIN AND HEMATOCRIT, BLOOD
HCT: 23 % — ABNORMAL LOW (ref 39.0–52.0)
Hemoglobin: 7.9 g/dL — ABNORMAL LOW (ref 13.0–17.0)

## 2014-10-10 LAB — CREATININE, FLUID (PLEURAL, PERITONEAL, JP DRAINAGE): Creat, Fluid: 1.4 mg/dL

## 2014-10-10 MED ORDER — HYDROMORPHONE HCL 2 MG PO TABS: 2.0000 mg | ORAL_TABLET | Freq: Four times a day (QID) | ORAL | Status: DC | PRN

## 2014-10-10 MED ORDER — POTASSIUM CHLORIDE CRYS ER 20 MEQ PO TBCR
20.0000 meq | EXTENDED_RELEASE_TABLET | Freq: Two times a day (BID) | ORAL | Status: DC
  Administered 2014-10-10: 20 meq via ORAL
  Filled 2014-10-10: qty 1

## 2014-10-10 MED ORDER — SENNOSIDES-DOCUSATE SODIUM 8.6-50 MG PO TABS: 1.0000 | ORAL_TABLET | Freq: Two times a day (BID) | ORAL | Status: DC

## 2014-10-10 NOTE — Anesthesia Postprocedure Evaluation (Signed)
  Anesthesia Post-op Note  Patient: Brian Leon  Procedure(s) Performed: Procedure(s): ROBOTIC ASSITED PARTIAL NEPHRECTOMY  (Right)  Patient Location: PACU  Anesthesia Type:General  Level of Consciousness: awake  Airway and Oxygen Therapy: Patient Spontanous Breathing and Patient connected to nasal cannula oxygen  Post-op Pain: mild  Post-op Assessment: Post-op Vital signs reviewed, Patient's Cardiovascular Status Stable, Respiratory Function Stable, Patent Airway, No signs of Nausea or vomiting and Pain level controlled  Post-op Vital Signs: Reviewed and stable  Last Vitals:  Filed Vitals:   10/10/14 0518  BP: 116/69  Pulse: 83  Temp: 37.1 C  Resp: 18    Complications: No apparent anesthesia complications

## 2014-10-10 NOTE — Progress Notes (Addendum)
MEDICATION RELATED CONSULT NOTE - INITIAL   Pharmacy Consult for Assistance with cyclosporine dose adjustment and monitoring after partial nephrectomy Indication: Severe psoriasis  Allergies  Allergen Reactions  . Acetaminophen Other (See Comments)    "upsets my stomach"  . Oxycodone Nausea Only  . Oxycodone-Acetaminophen Nausea Only  . Sulfa Drugs Cross Reactors Other (See Comments)    Flu like symptoms    Patient Measurements: Height: 5\' 9"  (175.3 cm) Weight: 141 lb 15.6 oz (64.4 kg) IBW/kg (Calculated) : 70.7 Recent weight: 64 kg on 10/02/15  Vital Signs: Temp: 98.7 F (37.1 C) (01/08 0518) Temp Source: Oral (01/08 0518) BP: 116/69 mmHg (01/08 0518) Pulse Rate: 83 (01/08 0518) Intake/Output from previous day: 01/07 0701 - 01/08 0700 In: 1911.3 [P.O.:1040; I.V.:871.3] Out: 5732 [Urine:1725; Drains:105] Intake/Output from this shift: Total I/O In: -  Out: 795 [Urine:775; Drains:20]  Labs:  Recent Labs  10/08/14 1730 10/09/14 0400 10/10/14 0400  HGB 10.3* 8.9* 7.9*  HCT 31.1* 26.4* 23.0*  CREATININE  --  1.50* 1.38*   Estimated Creatinine Clearance: 45.4 mL/min (by C-G formula based on Cr of 1.38).   Microbiology: No results found for this or any previous visit (from the past 720 hour(s)).  Medical History: Past Medical History  Diagnosis Date  . Asthma     PFT 2009 showed mod to severe with good reversibility with albuterol  . Osteoporosis   . Hypertension   . Renal cyst, right     COMPLEX  . Bilateral ureteral calculi   . Blind right eye     WEARS PROSTHESIS  . Allergic rhinitis   . Paraureteric diverticulum     BILATERAL  . Arthritis   . History of bladder stone   . Seasonal allergies   . Eczema   . History of kidney stones   . Hepatitis     hx of  . Complication of anesthesia     "woke up before hernia surgery complete" 2012    Medications:  Prescriptions prior to admission  Medication Sig Dispense Refill Last Dose  . albuterol  (PROVENTIL HFA;VENTOLIN HFA) 108 (90 BASE) MCG/ACT inhaler Inhale 1-2 puffs into the lungs every 6 (six) hours as needed for wheezing or shortness of breath. 8.5 g 2 Past Week at Unknown time  . cycloSPORINE modified (NEORAL/GENGRAF) 100 MG capsule Take 100 mg by mouth 2 (two) times daily.    10/07/2014 at 2200  . finasteride (PROSCAR) 5 MG tablet Take 5 mg by mouth daily.   10/07/2014 at 1000  . fluticasone (FLONASE) 50 MCG/ACT nasal spray Place 1 spray into both nostrils daily.   10/08/2014 at 0900  . Fluticasone-Salmeterol (ADVAIR) 500-50 MCG/DOSE AEPB Inhale 1 puff into the lungs 2 (two) times daily. 60 each 11 10/08/2014 at 0900  . hydrochlorothiazide (MICROZIDE) 12.5 MG capsule Take 12.5 mg by mouth 2 (two) times daily.   10/07/2014 at 2200  . trifluoperazine (STELAZINE) 1 MG tablet Take 1-2 mg by mouth as needed.    10/07/2014 at 2200  . ADVAIR DISKUS 500-50 MCG/DOSE AEPB INHALE 1 PUFF INTO THE LUNGS TWICE A DAY (Patient not taking: Reported on 09/24/2014) 60 each 2   . fluticasone (FLONASE) 50 MCG/ACT nasal spray PLACE 1 SPRAY INTO BOTH NOSTRILS DAILY. (Patient not taking: Reported on 09/24/2014) 16 g 6   . hydrochlorothiazide (MICROZIDE) 12.5 MG capsule TAKE 1 CAPSULE (12.5 MG TOTAL) BY MOUTH 2 (TWO) TIMES DAILY. (Patient not taking: Reported on 09/24/2014) 60 capsule 1   . oxyCODONE (OXY  IR/ROXICODONE) 5 MG immediate release tablet Take 1 tablet (5 mg total) by mouth every 4 (four) hours as needed for severe pain. (Patient not taking: Reported on 09/24/2014) 15 tablet 0   . tamsulosin (FLOMAX) 0.4 MG CAPS capsule Take 1 capsule (0.4 mg total) by mouth daily. For Urination. (Patient not taking: Reported on 09/24/2014) 30 capsule 11 Taking    Assessment: 69 yo male s/p partial nephrectomy on 10/08/14.  He takes modified cyclosporine (Neoral brand) 100 mg PO BID for severe psoriasis.  Pharmacy has been consulted by Urology to assist with dosing modification after surgery.  1/7: Baseline CsA level 110 is  wnl based on standard reference ranges, although slightly low for this institution's normal range of 140-330.  Note that this level was drawn after 6 hrs, as opposed to a true trough drawn at 10-12 hrs.  True level is likely to be slightly lower than determined.   Goal of Therapy:  General range of 100-400 ng/mL Avoidance of toxicity   Plan:   Continue current dosage of cyclosporine (Neoral brand) 100mg  BID. As baseline serum CsA level is low-normal and clearance is expected to decrease due to nephrectomy, would not make any adjustments at this time  Recommend rechecking level at new steady state (approx 10/13/14) and adjust dose as necessary - can be made in 25 mg increments  It is also recommended to increase the frequency of blood pressure monitoring after each alteration in dosage of cyclosporine.  Reuel Boom, PharmD Pager: [phone] 10/10/2014, 8:16 AM   ADDENDUM: patient noted to have refused both doses 1/7; will need to establish whether or not patient will continue medication before beginning count to steady state concentration (~5 days)

## 2014-10-10 NOTE — Discharge Summary (Signed)
Physician Discharge Summary  Patient ID: Brian Leon MRN: [id number] DOB/AGE: Aug 14, 1946 69 y.o.  Admit date: 10/08/2014 Discharge date: 10/10/2014  Admission Diagnoses: Rt Renal Mass  Discharge Diagnoses:  Active Problems:   Renal mass   Discharged Condition: good  Hospital Course:    1 - Rt Cystic Renal Mass - s/p right robotic partial nephrectomy 10/08/14 for increasing size enhancing cystic / nodular renal mass. Non stented. Path pending. JP removed POD 2 as output minimal and Cr same as serum 24 hours after foley removed. Dc Hgb 7.9 (10 pre-op) and no s/s anemia.   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 Now s/p partial nephrecotmy. Pre-op Cr 1.1 / GFR 65. POD 2 / discharge Cr 1.4 / GFR 50. Pharmacy consult assisting with dose modification and DC planning and recs DC on home dose as levels remain on low side.  Today Brian Leon is w/o complaints. Foley removed this AM. One episode small emesis last PM, no nausea at present.  Consults: pharmacy  Significant Diagnostic Studies: labs: hgb, cr, as per above.  Treatments: surgery:  right robotic partial nephrectomy 10/08/14  Discharge Exam: Blood pressure 140/79, pulse 82, temperature 99.4 F (37.4 C), temperature source Oral, resp. rate 18, height 5\' 9"  (1.753 m), weight 64.4 kg (141 lb 15.6 oz), SpO2 98 %. General appearance: alert, cooperative and appears stated age Head: Normocephalic, without obvious abnormality, atraumatic Nose: Nares normal. Septum midline. Mucosa normal. No drainage or sinus tenderness. Throat: lips, mucosa, and tongue normal; teeth and gums normal Neck: supple, symmetrical, trachea midline Back: symmetric, no curvature. ROM normal. No CVA tenderness. Resp: non-labored on room air Cardio: Nl rate GI: soft, non-tender; bowel sounds normal; no masses,  no organomegaly Male genitalia: normal Extremities: extremities normal, atraumatic, no cyanosis or  edema Pulses: 2+ and symmetric Skin: Skin color, texture, turgor normal. No rashes or lesions Lymph nodes: Cervical, supraclavicular, and axillary nodes normal. Neurologic: Grossly normal Incision/Wound: Recent port sites / extraction sites c/d/i. Mild skn bruising, no flank hematomas.   Disposition: 01-Home or Self Care     Medication List    ASK your doctor about these medications        albuterol 108 (90 BASE) MCG/ACT inhaler  Commonly known as:  PROVENTIL HFA;VENTOLIN HFA  Inhale 1-2 puffs into the lungs every 6 (six) hours as needed for wheezing or shortness of breath.     cycloSPORINE modified 100 MG capsule  Commonly known as:  NEORAL  Take 100 mg by mouth 2 (two) times daily.     finasteride 5 MG tablet  Commonly known as:  PROSCAR  Take 5 mg by mouth daily.     fluticasone 50 MCG/ACT nasal spray  Commonly known as:  FLONASE  Place 1 spray into both nostrils daily.     fluticasone 50 MCG/ACT nasal spray  Commonly known as:  FLONASE  PLACE 1 SPRAY INTO BOTH NOSTRILS DAILY.     Fluticasone-Salmeterol 500-50 MCG/DOSE Aepb  Commonly known as:  ADVAIR  Inhale 1 puff into the lungs 2 (two) times daily.     ADVAIR DISKUS 500-50 MCG/DOSE Aepb  Generic drug:  Fluticasone-Salmeterol  INHALE 1 PUFF INTO THE LUNGS TWICE A DAY     hydrochlorothiazide 12.5 MG capsule  Commonly known as:  MICROZIDE  Take 12.5 mg by mouth 2 (two) times daily.     hydrochlorothiazide 12.5 MG capsule  Commonly known as:  MICROZIDE  TAKE 1 CAPSULE (  12.5 MG TOTAL) BY MOUTH 2 (TWO) TIMES DAILY.     oxyCODONE 5 MG immediate release tablet  Commonly known as:  Oxy IR/ROXICODONE  Take 1 tablet (5 mg total) by mouth every 4 (four) hours as needed for severe pain.     tamsulosin 0.4 MG Caps capsule  Commonly known as:  FLOMAX  Take 1 capsule (0.4 mg total) by mouth daily. For Urination.     trifluoperazine 1 MG tablet  Commonly known as:  STELAZINE  Take 1-2 mg by mouth as needed.            Follow-up Information    Follow up with Alexis Frock, MD On 10/23/2014.   Specialty:  Urology   Why:  at 9:30   Contact information:   [place] [address] [phone]       Signed: Alexis Frock 10/10/2014, 4:37 PM

## 2014-10-12 LAB — TYPE AND SCREEN
ABO/RH(D): O POS
Antibody Screen: NEGATIVE
Unit division: 0
Unit division: 0

## 2014-10-23 DIAGNOSIS — R339 Retention of urine, unspecified: Secondary | ICD-10-CM | POA: Diagnosis not present

## 2014-10-23 DIAGNOSIS — C641 Malignant neoplasm of right kidney, except renal pelvis: Secondary | ICD-10-CM | POA: Diagnosis not present

## 2014-10-26 ENCOUNTER — Other Ambulatory Visit: Payer: Self-pay | Admitting: Family Medicine

## 2014-11-12 DIAGNOSIS — L821 Other seborrheic keratosis: Secondary | ICD-10-CM | POA: Diagnosis not present

## 2014-11-12 DIAGNOSIS — L209 Atopic dermatitis, unspecified: Secondary | ICD-10-CM | POA: Diagnosis not present

## 2014-11-12 DIAGNOSIS — L281 Prurigo nodularis: Secondary | ICD-10-CM | POA: Diagnosis not present

## 2014-11-12 DIAGNOSIS — Z85828 Personal history of other malignant neoplasm of skin: Secondary | ICD-10-CM | POA: Diagnosis not present

## 2014-11-12 DIAGNOSIS — L570 Actinic keratosis: Secondary | ICD-10-CM | POA: Diagnosis not present

## 2014-11-12 DIAGNOSIS — Z79899 Other long term (current) drug therapy: Secondary | ICD-10-CM | POA: Diagnosis not present

## 2014-11-19 ENCOUNTER — Other Ambulatory Visit: Payer: Self-pay | Admitting: Family Medicine

## 2014-11-25 ENCOUNTER — Ambulatory Visit (INDEPENDENT_AMBULATORY_CARE_PROVIDER_SITE_OTHER): Payer: Medicare Other | Admitting: Family Medicine

## 2014-11-25 DIAGNOSIS — N200 Calculus of kidney: Secondary | ICD-10-CM | POA: Diagnosis not present

## 2014-11-25 DIAGNOSIS — I10 Essential (primary) hypertension: Secondary | ICD-10-CM | POA: Diagnosis not present

## 2014-11-25 DIAGNOSIS — M79602 Pain in left arm: Secondary | ICD-10-CM | POA: Insufficient documentation

## 2014-11-25 DIAGNOSIS — C641 Malignant neoplasm of right kidney, except renal pelvis: Secondary | ICD-10-CM | POA: Diagnosis not present

## 2014-11-25 DIAGNOSIS — R600 Localized edema: Secondary | ICD-10-CM | POA: Diagnosis not present

## 2014-11-25 DIAGNOSIS — D6489 Other specified anemias: Secondary | ICD-10-CM

## 2014-11-25 DIAGNOSIS — M25519 Pain in unspecified shoulder: Secondary | ICD-10-CM | POA: Insufficient documentation

## 2014-11-25 DIAGNOSIS — N5201 Erectile dysfunction due to arterial insufficiency: Secondary | ICD-10-CM | POA: Diagnosis not present

## 2014-11-25 DIAGNOSIS — N210 Calculus in bladder: Secondary | ICD-10-CM | POA: Diagnosis not present

## 2014-11-25 LAB — CBC WITH DIFFERENTIAL/PLATELET
Basophils Absolute: 0 10*3/uL (ref 0.0–0.1)
Basophils Relative: 0 % (ref 0–1)
Eosinophils Absolute: 0.7 10*3/uL (ref 0.0–0.7)
Eosinophils Relative: 7 % — ABNORMAL HIGH (ref 0–5)
HCT: 33.2 % — ABNORMAL LOW (ref 39.0–52.0)
Hemoglobin: 11.2 g/dL — ABNORMAL LOW (ref 13.0–17.0)
Lymphocytes Relative: 19 % (ref 12–46)
Lymphs Abs: 1.8 10*3/uL (ref 0.7–4.0)
MCH: 31.7 pg (ref 26.0–34.0)
MCHC: 33.7 g/dL (ref 30.0–36.0)
MCV: 94.1 fL (ref 78.0–100.0)
MPV: 8.6 fL (ref 8.6–12.4)
Monocytes Absolute: 0.8 10*3/uL (ref 0.1–1.0)
Monocytes Relative: 8 % (ref 3–12)
Neutro Abs: 6.3 10*3/uL (ref 1.7–7.7)
Neutrophils Relative %: 66 % (ref 43–77)
Platelets: 404 10*3/uL — ABNORMAL HIGH (ref 150–400)
RBC: 3.53 MIL/uL — ABNORMAL LOW (ref 4.22–5.81)
RDW: 13.2 % (ref 11.5–15.5)
WBC: 9.6 10*3/uL (ref 4.0–10.5)

## 2014-11-25 LAB — COMPLETE METABOLIC PANEL WITH GFR
ALT: 8 U/L (ref 0–53)
AST: 13 U/L (ref 0–37)
Albumin: 4.2 g/dL (ref 3.5–5.2)
Alkaline Phosphatase: 76 U/L (ref 39–117)
BUN: 17 mg/dL (ref 6–23)
CO2: 25 mEq/L (ref 19–32)
Calcium: 9.5 mg/dL (ref 8.4–10.5)
Chloride: 97 mEq/L (ref 96–112)
Creat: 1.4 mg/dL — ABNORMAL HIGH (ref 0.50–1.35)
GFR, Est African American: 58 mL/min — ABNORMAL LOW
GFR, Est Non African American: 51 mL/min — ABNORMAL LOW
Glucose, Bld: 82 mg/dL (ref 70–99)
Potassium: 3.3 mEq/L — ABNORMAL LOW (ref 3.5–5.3)
Sodium: 134 mEq/L — ABNORMAL LOW (ref 135–145)
Total Bilirubin: 0.5 mg/dL (ref 0.2–1.2)
Total Protein: 6.7 g/dL (ref 6.0–8.3)

## 2014-11-25 MED ORDER — DICLOFENAC SODIUM 1 % TD GEL: 4.0000 g | Freq: Four times a day (QID) | TRANSDERMAL | Status: DC | PRN

## 2014-11-25 MED ORDER — OXYCODONE-ACETAMINOPHEN 5-325 MG PO TABS: 1.0000 | ORAL_TABLET | Freq: Three times a day (TID) | ORAL | Status: DC | PRN

## 2014-11-25 MED ORDER — OXYCODONE HCL 5 MG PO CAPS
5.0000 mg | ORAL_CAPSULE | Freq: Three times a day (TID) | ORAL | Status: DC | PRN
Start: 2014-11-25 — End: 2016-03-01

## 2014-11-25 NOTE — Assessment & Plan Note (Addendum)
Patient endorses pain at several sites along his left arm. Physical exam is unremarkable except for mild tenderness over the lateral epicondyle.  Given patient's CKD and partial nephrectomy, I advised him to not continue to use over-the-counter NSAIDs. Etiology is unclear at this time.  Will treat with topical Voltaren.  I also gave the patient a prescription for oxycodone 5 mg # (which he states has worked in the past)

## 2014-11-25 NOTE — Assessment & Plan Note (Signed)
No historical findings or physical exam findings to suggest heart failure. Significant anemia, however, was noted upon review of recent labs. This in addition to NSAID use is likely the etiology/ Hb was 7.9 in January 2016. Checking CBC and CMP today. Patient to follow up closely with PCP regarding anemia.

## 2014-11-25 NOTE — Progress Notes (Signed)
   Subjective:    Patient ID: Brian Leon, male    DOB: 02-02-1946, 69 y.o.   MRN: [id number]  HPI 69 year old male with past medical history of Osteoporosis presents for a same-day appointment with complaints of left arm pain.  1) Left arm pain  Patient reports that he has been experiencing left arm pain for the past 7 days.  He states that the pain is located in his elbow, tricep area, wrist and posterior shoulder.  He reports that the pain is mild to moderate and occasionally gets more severe.  Pain is particularly bothersome at night and interferes with his ability to sleep.  He does not recall any recent fall, trauma, injury. No change in his physical activity. No recent heavy lifting.  He has been using frequent NSAIDs (Advil) for the pain with mild improvement.    2) LE edema  Patient reports increasing lower extremity swelling for the past week.  Of note, he had a recent partial nephrectomy in January.   LE edema worse at the end of the day.  Patient denies shortness of breath, PND, orthopnea.  No calf tenderness or swelling.   PMH reviewed.  Patient w/ recent partial nephrectomy.   Social Hx - former smoker.  Review of Systems Per HPI    Objective:   Physical Exam  Exam: General: well-appearing thin gentleman in no acute distress.  Shoulder: Left Inspection reveals no abnormalities, atrophy or asymmetry. Palpation is normal with no tenderness over AC joint or bicipital groove. ROM is full in all planes. Rotator cuff strength normal throughout. No signs of impingement. No painful arc and no drop arm sign.  Wrist: Left Inspection normal with no visible erythema or swelling. ROM smooth and normal with good flexion and extension and ulnar/radial deviation that is symmetrical with opposite wrist.  Elbow: Left Unremarkable to inspection. Range of motion full pronation, supination, flexion, extension. Stable to varus, valgus stress. Slightly tender to  palpation over the lateral epicondyle.     Assessment & Plan:  See Problem List

## 2014-11-25 NOTE — Patient Instructions (Signed)
It was nice to see you today.  Use the topical medication as prescribed for your pain. If it worsens or fails to improve please follow-up with your primary.  We will let you know about your lab results. In the meantime please be sure to eat iron rich foods and elevate her legs when at rest.  Take care  Dr. Lacinda Axon

## 2014-11-26 ENCOUNTER — Encounter: Payer: Self-pay | Admitting: Family Medicine

## 2014-11-27 ENCOUNTER — Telehealth: Payer: Self-pay | Admitting: Family Medicine

## 2014-11-27 NOTE — Telephone Encounter (Signed)
Called patient. No answer. What are his concerns?

## 2014-11-27 NOTE — Telephone Encounter (Signed)
Pt missed the doctors call and is returning it. Brian Leon

## 2014-11-27 NOTE — Telephone Encounter (Signed)
Pt seen Dr. Lacinda Axon 2 days ago and was told by Dr. Lacinda Axon that if he wasn't feeling better to call him and let him know. Well he is not feeling better and would like to speak to Dr. Lacinda Axon. jw

## 2014-11-28 MED ORDER — PREDNISONE 50 MG PO TABS: 50.0000 mg | ORAL_TABLET | Freq: Every day | ORAL | Status: DC

## 2014-11-28 NOTE — Telephone Encounter (Signed)
Spoke with patient.  Concerns addressed.

## 2014-11-28 NOTE — Telephone Encounter (Signed)
Pt calling again, would like to speak with Dr. Lacinda Axon, did not want to give details, just said his arm isn't any better.

## 2014-12-02 ENCOUNTER — Encounter: Payer: Self-pay | Admitting: Family Medicine

## 2014-12-02 ENCOUNTER — Ambulatory Visit (INDEPENDENT_AMBULATORY_CARE_PROVIDER_SITE_OTHER): Payer: Medicare Other | Admitting: Family Medicine

## 2014-12-02 VITALS — BP 128/69 | HR 64 | Temp 97.8°F | Ht 69.0 in | Wt 139.6 lb

## 2014-12-02 DIAGNOSIS — M79602 Pain in left arm: Secondary | ICD-10-CM | POA: Diagnosis not present

## 2014-12-02 MED ORDER — NORTRIPTYLINE HCL 50 MG PO CAPS: 50.0000 mg | ORAL_CAPSULE | Freq: Every day | ORAL | Status: DC

## 2014-12-02 NOTE — Assessment & Plan Note (Signed)
Etiology unclear. I discussed this with attending Dr. Nori Riis. She also evaluated the patient and did not have an explanation for his complaints. Will refer to Orthopedics. Patient started on Nortriptyline for pain (also will help with sleep).

## 2014-12-02 NOTE — Patient Instructions (Signed)
It was nice to see you today.  Use the Nortriptyline as indicated.  Follow up closely with Dr. Raoul Pitch.  Take care  Dr. Lacinda Axon

## 2014-12-02 NOTE — Progress Notes (Signed)
   Subjective:    Patient ID: Brian Leon, male    DOB: 06-25-1946, 69 y.o.   MRN: [id number]  HPI 69 year old male presents for follow up regarding L arm pain.  1) Left arm pain  Patient continues to report elbow, wrist, hand, tricep and shoulder pain.  He has had little improvement with pain medication (Oxycodone), topical anti-inflammatory (Voltaren), Prednisone.  Today, he states he continues to have severe pain that is particularly troublesome at night and interferes with sleep.  No known exacerbating factors. No relieving factors (as above).  No reports of numbness, tingling.   He is requesting pain medication today.  Review of Systems Per HPI     Objective:   Physical Exam Filed Vitals:   12/02/14 1525  BP: 128/69  Pulse: 64  Temp: 97.8 F (36.6 C)  Exam: General: anxious appearing male, NAD.   Shoulder: Left Inspection reveals no abnormalities, atrophy or asymmetry. Palpation is normal with no tenderness. Exam unchanged from prior.  Wrist: Left Inspection normal with no visible erythema or swelling. ROM smooth and normal with good flexion and extension and ulnar/radial deviation that is symmetrical with opposite wrist. Stable to varus/valgus stress. Exam unchanged from prior.  Elbow: Left Unremarkable to inspection. Nontender to palpation. Exam unchanged from prior.     Assessment & Plan:  See Problem List

## 2014-12-08 ENCOUNTER — Encounter: Payer: Self-pay | Admitting: Family Medicine

## 2014-12-08 ENCOUNTER — Ambulatory Visit (INDEPENDENT_AMBULATORY_CARE_PROVIDER_SITE_OTHER): Payer: Medicare Other | Admitting: Family Medicine

## 2014-12-08 VITALS — BP 134/80 | HR 83 | Temp 98.3°F | Ht 69.0 in | Wt 138.5 lb

## 2014-12-08 DIAGNOSIS — R208 Other disturbances of skin sensation: Secondary | ICD-10-CM

## 2014-12-08 DIAGNOSIS — R200 Anesthesia of skin: Secondary | ICD-10-CM

## 2014-12-08 MED ORDER — GABAPENTIN 300 MG PO CAPS
300.0000 mg | ORAL_CAPSULE | Freq: Two times a day (BID) | ORAL | Status: DC
Start: 2014-12-08 — End: 2015-01-05

## 2014-12-08 NOTE — Progress Notes (Signed)
    Subjective    Brian Leon is a 69 y.o. male that presents for an office visit.   1. Left arm numbness: Pain improved. Now has some tingling/numbness in hand that is mostly in thumb, index and middle. Has a history of cervical fusion for headaches about 25 years ago. Has a history of carpal tunnel with surgery 3 years ago by a neurosurgeon. No alleviating or aggravating factors.  History  Substance Use Topics  . Smoking status: Former Smoker -- 20 years    Types: Cigarettes    Quit date: 12/13/1983  . Smokeless tobacco: Never Used  . Alcohol Use: No     Comment: quit drinking 30 years ago    Allergies  Allergen Reactions  . Acetaminophen Other (See Comments)    "upsets my stomach"  . Oxycodone Nausea Only  . Oxycodone-Acetaminophen Nausea Only  . Sulfa Drugs Cross Reactors Other (See Comments)    Flu like symptoms    No orders of the defined types were placed in this encounter.    ROS  Per HPI   Objective   BP 134/80 mmHg  Pulse 83  Temp(Src) 98.3 F (36.8 C) (Oral)  Ht 5\' 9"  (1.753 m)  Wt 138 lb 8 oz (62.823 kg)  BMI 20.44 kg/m2  General: Fair appearing, appears stated age Musculoskeletal: No tenderness along left arm Neuro:  Left upper extremity: brachial, triceps and biceps reflexes not illicited. Positive Tinnel. Negative Phalen's. 4/5 UE strength, 4/5 grip strength  Right upper extremity: brachial and biceps reflexes not illicited 2+ triceps reflex. 5/5 UE and grip strength  Assessment and Plan    Left hand numbness: probably carpal tunnel, but with cervical neck history, could be a more proximal lesion  Referral to neurology for nerve conduction testing  Cock up splint qHS  Gabapentin 300mg  BID  Follow-up with PCP

## 2014-12-08 NOTE — Patient Instructions (Addendum)
Thank you for coming to see me today. It was a pleasure. Today we talked about:   Left hand numbness: this seems like a median nerve issue. Since I am unsure if this is from your neck or your wrist, I will refer you to neurology to get nerve conduction testing. This will help in the diagnosis. We can also try gabapentin which is a nerve pain medication. This may make you a little sleepy so I would try it at home to see how it affects you. Take Gabapentin 300mg  (1 capsule) once today, then take it twice per day starting tomorrow.  Please make an appointment to see Dr. Raoul Pitch in 2-4 weeks for follow-up.  If you have any questions or concerns, please do not hesitate to call the office at [phone].  Sincerely,  Cordelia Poche, MD

## 2014-12-18 ENCOUNTER — Other Ambulatory Visit: Payer: Self-pay | Admitting: Family Medicine

## 2014-12-18 DIAGNOSIS — N5201 Erectile dysfunction due to arterial insufficiency: Secondary | ICD-10-CM | POA: Diagnosis not present

## 2014-12-22 ENCOUNTER — Telehealth: Payer: Self-pay | Admitting: Family Medicine

## 2014-12-22 NOTE — Telephone Encounter (Signed)
Pt called and was checking the status of his referral to a Neuro doctor. jw

## 2014-12-22 NOTE — Telephone Encounter (Signed)
Spoke with patient and he had spoken with [facility] and his appointment is 01/01/2015

## 2014-12-30 ENCOUNTER — Encounter: Payer: Self-pay | Admitting: Family Medicine

## 2014-12-30 ENCOUNTER — Ambulatory Visit (INDEPENDENT_AMBULATORY_CARE_PROVIDER_SITE_OTHER): Payer: Medicare Other | Admitting: Family Medicine

## 2014-12-30 VITALS — BP 128/82 | HR 84 | Temp 98.1°F | Ht 69.0 in | Wt 141.1 lb

## 2014-12-30 DIAGNOSIS — K5900 Constipation, unspecified: Secondary | ICD-10-CM | POA: Diagnosis not present

## 2014-12-30 DIAGNOSIS — I10 Essential (primary) hypertension: Secondary | ICD-10-CM | POA: Diagnosis not present

## 2014-12-30 DIAGNOSIS — M79602 Pain in left arm: Secondary | ICD-10-CM | POA: Diagnosis not present

## 2014-12-30 MED ORDER — ALBUTEROL SULFATE HFA 108 (90 BASE) MCG/ACT IN AERS: 1.0000 | INHALATION_SPRAY | Freq: Four times a day (QID) | RESPIRATORY_TRACT | Status: DC | PRN

## 2014-12-30 MED ORDER — POLYETHYLENE GLYCOL 3350 17 G PO PACK: 17.0000 g | PACK | Freq: Every day | ORAL | Status: DC

## 2014-12-30 NOTE — Assessment & Plan Note (Signed)
Patient experiencing constipation since partial nephrectomy and morphine use postoperatively. Has been taking Colace 3 times a day for the last few weeks, and now it is working "too well for him." - Have prescribed him Miralax for him to use daily, and adjust according to the consistency of his bowel movements. Follow-up as needed

## 2014-12-30 NOTE — Assessment & Plan Note (Signed)
Pain seems to be mildly improved, he has a neurologist appointment in 2 days. He is taking his gabapentin 300 mg twice a day. He has stopped using the nortriptyline, but he states he will restart it. Follow-up after neurologist if needed.

## 2014-12-30 NOTE — Patient Instructions (Addendum)
Use Miralax, a cap a day, to help with constipation.  You can increase or decrease this amount depending on your BM. Hypertension: Take your BP 3-4 times a week and record these readings. If you are dizzy, feel weak or have chest pain, please DC the medication and be seen immediately. If your BP is below 100 on the top number, stop your medication and make an appointment.  Watch the salt content in your diet, this can elevate your BP Make an appointment to follow up on chronic issues for 3 months.  Low-Sodium Eating Plan Sodium raises blood pressure and causes water to be held in the body. Getting less sodium from food will help lower your blood pressure, reduce any swelling, and protect your heart, liver, and kidneys. We get sodium by adding salt (sodium chloride) to food. Most of our sodium comes from canned, boxed, and frozen foods. Restaurant foods, fast foods, and pizza are also very high in sodium. Even if you take medicine to lower your blood pressure or to reduce fluid in your body, getting less sodium from your food is important. WHAT IS MY PLAN? Most people should limit their sodium intake to 2,300 mg a day. Your health care provider recommends that you limit your sodium intake to __________ a day.  WHAT DO I NEED TO KNOW ABOUT THIS EATING PLAN? For the low-sodium eating plan, you will follow these general guidelines:  Choose foods with a % Daily Value for sodium of less than 5% (as listed on the food label).   Use salt-free seasonings or herbs instead of table salt or sea salt.   Check with your health care provider or pharmacist before using salt substitutes.   Eat fresh foods.  Eat more vegetables and fruits.  Limit canned vegetables. If you do use them, rinse them well to decrease the sodium.   Limit cheese to 1 oz (28 g) per day.   Eat lower-sodium products, often labeled as "lower sodium" or "no salt added."  Avoid foods that contain monosodium glutamate (MSG). MSG is  sometimes added to [place] food and some canned foods.  Check food labels (Nutrition Facts labels) on foods to learn how much sodium is in one serving.  Eat more home-cooked food and less restaurant, buffet, and fast food.  When eating at a restaurant, ask that your food be prepared with less salt or none, if possible.  HOW DO I READ FOOD LABELS FOR SODIUM INFORMATION? The Nutrition Facts label lists the amount of sodium in one serving of the food. If you eat more than one serving, you must multiply the listed amount of sodium by the number of servings. Food labels may also identify foods as:  Sodium free--Less than 5 mg in a serving.  Very low sodium--35 mg or less in a serving.  Low sodium--140 mg or less in a serving.  Light in sodium--50% less sodium in a serving. For example, if a food that usually has 300 mg of sodium is changed to become light in sodium, it will have 150 mg of sodium.  Reduced sodium--25% less sodium in a serving. For example, if a food that usually has 400 mg of sodium is changed to reduced sodium, it will have 300 mg of sodium. WHAT FOODS CAN I EAT? Grains Low-sodium cereals, including oats, puffed wheat and rice, and shredded wheat cereals. Low-sodium crackers. Unsalted rice and pasta. Lower-sodium bread.  Vegetables Frozen or fresh vegetables. Low-sodium or reduced-sodium canned vegetables. Low-sodium or reduced-sodium tomato  sauce and paste. Low-sodium or reduced-sodium tomato and vegetable juices.  Fruits Fresh, frozen, and canned fruit. Fruit juice.  Meat and Other Protein Products Low-sodium canned tuna and salmon. Fresh or frozen meat, poultry, seafood, and fish. Lamb. Unsalted nuts. Dried beans, peas, and lentils without added salt. Unsalted canned beans. Homemade soups without salt. Eggs.  Dairy Milk. Soy milk. Ricotta cheese. Low-sodium or reduced-sodium cheeses. Yogurt.  Condiments Fresh and dried herbs and spices. Salt-free seasonings.  Onion and garlic powders. Low-sodium varieties of mustard and ketchup. Lemon juice.  Fats and Oils Reduced-sodium salad dressings. Unsalted butter.  Other Unsalted popcorn and pretzels.  The items listed above may not be a complete list of recommended foods or beverages. Contact your dietitian for more options. WHAT FOODS ARE NOT RECOMMENDED? Grains Instant hot cereals. Bread stuffing, pancake, and biscuit mixes. Croutons. Seasoned rice or pasta mixes. Noodle soup cups. Boxed or frozen macaroni and cheese. Self-rising flour. Regular salted crackers. Vegetables Regular canned vegetables. Regular canned tomato sauce and paste. Regular tomato and vegetable juices. Frozen vegetables in sauces. Salted french fries. Olives. Angie Fava. Relishes. Sauerkraut. Salsa. Meat and Other Protein Products Salted, canned, smoked, spiced, or pickled meats, seafood, or fish. Bacon, ham, sausage, hot dogs, corned beef, chipped beef, and packaged luncheon meats. Salt pork. Jerky. Pickled herring. Anchovies, regular canned tuna, and sardines. Salted nuts. Dairy Processed cheese and cheese spreads. Cheese curds. Blue cheese and cottage cheese. Buttermilk.  Condiments Onion and garlic salt, seasoned salt, table salt, and sea salt. Canned and packaged gravies. Worcestershire sauce. Tartar sauce. Barbecue sauce. Teriyaki sauce. Soy sauce, including reduced sodium. Steak sauce. Fish sauce. Oyster sauce. Cocktail sauce. Horseradish. Regular ketchup and mustard. Meat flavorings and tenderizers. Bouillon cubes. Hot sauce. Tabasco sauce. Marinades. Taco seasonings. Relishes. Fats and Oils Regular salad dressings. Salted butter. Margarine. Ghee. Bacon fat.  Other Potato and tortilla chips. Corn chips and puffs. Salted popcorn and pretzels. Canned or dried soups. Pizza. Frozen entrees and pot pies.  The items listed above may not be a complete list of foods and beverages to avoid. Contact your dietitian for more  information. Document Released: 03/11/2002 Document Revised: 09/24/2013 Document Reviewed: 07/24/2013 [facility] Patient Information 2015 [place], [place]. This information is not intended to replace advice given to you by your health care provider. Make sure you discuss any questions you have with your health care provider.

## 2014-12-30 NOTE — Progress Notes (Signed)
   Subjective:    Patient ID: Brian Leon, male    DOB: 1946-06-26, 69 y.o.   MRN: [id number]  HPI   Left arm pain: Patient states that his left arm pain is mildly improved. He is still experiencing some numbness in his thumb and index fingers. He has taken his gabapentin 2 times a day. He had stopped the nortriptyline.  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 He has been taking Colace 3 times a day, and now is working too well. He is asking if there is something that he can take daily to keep his bowel movements are regular. Without Colace use, he states that he was going maybe once every 5 days. He is no longer taking any narcotic medications.  Hypertension: Patient states that he has tapered down his cyclosporine. Therefore he had started tapering down his HCTZ, now only takes it 1 time a day instead of twice a day. His blood pressure has been out goal at home. He denies any symptoms of low or high blood pressures at this time. No dizziness, changes in vision or chest pain.  Former smoker  Past Medical History  Diagnosis Date  . Asthma     PFT 2009 showed mod to severe with good reversibility with albuterol  . Osteoporosis   . Hypertension   . Renal cyst, right     COMPLEX  . Bilateral ureteral calculi   . Blind right eye     WEARS PROSTHESIS  . Allergic rhinitis   . Paraureteric diverticulum     BILATERAL  . Arthritis   . History of bladder stone   . Seasonal allergies   . Eczema   . History of kidney stones   . Hepatitis     hx of  . Complication of anesthesia     "woke up before hernia surgery complete" 2012    Allergies  Allergen Reactions  . Acetaminophen Other (See Comments)    "upsets my stomach"  . Oxycodone Nausea Only  . Oxycodone-Acetaminophen Nausea Only  . Sulfa Drugs Cross Reactors Other (See Comments)    Flu like symptoms    Review of Systems Per HPI    Objective:   Physical Exam BP 128/82 mmHg  Pulse 84   Temp(Src) 98.1 F (36.7 C) (Oral)  Ht 5\' 9"  (1.753 m)  Wt 141 lb 1.6 oz (64.003 kg)  BMI 20.83 kg/m2 Gen: NAD. Nontoxic in appearance, well-developed, well-nourished Caucasian male. HEENT: AT. [place]. Bilateral eyes with mild injections, no icterus. CV: RRR  Chest: CTAB, no wheeze or crackles Neuro: Negative Tinel sign at wrist and left elbow.    Assessment & Plan:

## 2014-12-30 NOTE — Assessment & Plan Note (Addendum)
Patient's BP is controlled today. He is now only taking HCTZ QD, instead of twice a day. He is being tapered off of his cyclosporine, which is likely the cause of his hypertension. Patient was instructed to take his blood pressure 3-4 times a week and record these. If his blood pressure is below 154 systolic or he becomes symptomatic she is to stop his medications and be seen immediately. Encouraged him to maintain a low-salt diet, AVS on low-sodium diet supplied. Follow-up in 3 months

## 2015-01-01 ENCOUNTER — Encounter: Payer: Self-pay | Admitting: Neurology

## 2015-01-01 ENCOUNTER — Ambulatory Visit (INDEPENDENT_AMBULATORY_CARE_PROVIDER_SITE_OTHER): Payer: Medicare Other | Admitting: Neurology

## 2015-01-01 ENCOUNTER — Encounter (INDEPENDENT_AMBULATORY_CARE_PROVIDER_SITE_OTHER): Payer: Self-pay | Admitting: Radiology

## 2015-01-01 DIAGNOSIS — G5601 Carpal tunnel syndrome, right upper limb: Secondary | ICD-10-CM

## 2015-01-01 DIAGNOSIS — M5010 Cervical disc disorder with radiculopathy, unspecified cervical region: Secondary | ICD-10-CM

## 2015-01-01 DIAGNOSIS — Z0289 Encounter for other administrative examinations: Secondary | ICD-10-CM

## 2015-01-01 DIAGNOSIS — M79602 Pain in left arm: Secondary | ICD-10-CM

## 2015-01-01 NOTE — Procedures (Signed)
     HISTORY:  Brian Leon is a 69 year old gentleman with a history of cervical spine surgery that was done approximately 30 years ago. The patient has a two-week history of onset of left hand numbness and discomfort that is associated with pain that goes up to the shoulder level. The patient denies any actual neck pain. He is being evaluated for a possible neuropathy or a cervical radiculopathy. The patient has a history of bilateral carpal tunnel syndrome surgery.   NERVE CONDUCTION STUDIES:  Nerve conduction studies were performed on both upper extremities. The distal motor latencies for the median nerves were prolonged on the right, normal on the left, with normal motor amplitudes for these nerves bilaterally. The distal motor latencies and motor amplitudes for the ulnar nerves were within normal limits bilaterally. The F wave latencies and nerve conduction velocities for the median and ulnar nerves were normal bilaterally. The sensory latencies for the median nerves were prolonged bilaterally, normal for the ulnar nerves and for the radial nerves bilaterally.  EMG STUDIES:  EMG study was performed on the left upper extremity:  The first dorsal interosseous muscle reveals 2 to 4 K units with full recruitment. No fibrillations or positive waves were noted. The abductor pollicis brevis muscle reveals 2 to 4 K units with full recruitment. No fibrillations or positive waves were noted. The extensor indicis proprius muscle reveals 1 to 3 K units with full recruitment. No fibrillations or positive waves were noted. The pronator teres muscle reveals 2 to 6 K units with decreased recruitment. No fibrillations or positive waves were noted. The biceps muscle reveals 1 to 2 K units with full recruitment. No fibrillations or positive waves were noted. The triceps muscle reveals 2 to 7 K units with moderately decreased recruitment. No fibrillations or positive waves were noted. The anterior deltoid  muscle reveals 2 to 3 K units with full recruitment. No fibrillations or positive waves were noted. The cervical paraspinal muscles were tested at 2 levels. No abnormalities of insertional activity were seen at the lower level tested. At the upper level, 2+ positive waves were noted. There was good relaxation.   IMPRESSION:  Nerve conduction studies done on both upper extremities shows evidence of a mild right carpal tunnel syndrome. There is evidence of a borderline left carpal tunnel syndrome. Otherwise no significant abnormalities were seen. On EMG evaluation of the left upper extremity, there is evidence of a primarily chronic C7 radiculopathy. Active denervation is seen in the left cervical paraspinal muscles, however, suggesting an acute component to the radiculopathy as well.  Jill Alexanders MD 01/01/2015 4:26 PM  [facility] [address] [place], [place] [address]  Phone [phone] Fax [phone]

## 2015-01-05 ENCOUNTER — Other Ambulatory Visit: Payer: Self-pay | Admitting: *Deleted

## 2015-01-05 ENCOUNTER — Telehealth: Payer: Self-pay | Admitting: Family Medicine

## 2015-01-05 MED ORDER — GABAPENTIN 300 MG PO CAPS: 300.0000 mg | ORAL_CAPSULE | Freq: Two times a day (BID) | ORAL | Status: DC

## 2015-01-05 NOTE — Telephone Encounter (Signed)
Called patient and he stated that neurology faxed over reports from tests and was wanting to know if we have received yet and what should he do next.

## 2015-01-05 NOTE — Telephone Encounter (Signed)
Would like to speak to someone about his recent neurology appt. He wants to know "What are we going to do about it." / thanks [employer], ASA

## 2015-01-06 NOTE — Telephone Encounter (Signed)
Patient needs to follow up with Dr. Raoul Pitch once she is back.

## 2015-01-08 NOTE — Telephone Encounter (Signed)
Pt calling back, is very upset because no one has called him, advised pt he needs an appt with Dr. Raoul Pitch per Dr. Lacinda Axon, but Dr. Raoul Pitch is booked up until 4/28. Pt said he wants Dr. Lacinda Axon to call him because he cannot wait until Monday, he needs to know what to do.

## 2015-01-10 NOTE — Telephone Encounter (Signed)
I have seen this patient a few times prior regarding his arm pain. Our interactions have been very difficult.  Our last interaction did not go particularly well.  Dr. Nori Riis evaluated the patient with me and he stated that if I didn't give him any pain medication he would "get it on the street".  I do not feel comfortable talking with him given our previous encounters.  You can inform him of the following results which are directly from the neurologist.  Nerve conduction studies done on both upper extremities shows evidence of a mild right carpal tunnel syndrome. There is evidence of a borderline left carpal tunnel syndrome. Otherwise no significant abnormalities were seen. On EMG evaluation of the left upper extremity, there is evidence of a primarily chronic C7 radiculopathy. Active denervation is seen in the left cervical paraspinal muscles, however, suggesting an acute component to the radiculopathy as well.  His problem is with the Left arm and as indicated this is from radiculopathy.   He needs to follow up with his PCP.

## 2015-01-12 ENCOUNTER — Telehealth: Payer: Self-pay | Admitting: Family Medicine

## 2015-01-12 DIAGNOSIS — M5412 Radiculopathy, cervical region: Secondary | ICD-10-CM

## 2015-01-12 NOTE — Telephone Encounter (Signed)
Called and spoke with patient today about his nerve conduction study. Discussed result in full detail with patient, patient has a C7 chronic radiculopathy with an active denervation seen at the left cervical paraspinal muscle, suggesting an acute component to his radiculopathy. In addition he did show evidence of having mild right carpal tunnel and borderline left carpal tunnel syndrome as well. Patient has a history of C6-C7 cervical fusion. He has had carpal tunnel release in the past. Patient is tolerating his renally dosed gabapentin, and feels it is taking the edge off of his pain. - Continue gabapentin - Cervical spine MRI ordered, patient will be contacted tomorrow from our office to set this up. - Placed neurosurgery referral, urgently, for Dr. Vertell Limber at [facility] neurosurgery. Patient has seen him in the distant past. Howard Pouch DO PGY3 [facility]   [place] team, please call and schedule MRI as soon as possible, order has been placed. Also neurosurgery referral has been placed, please call as soon as possible to push this through not emergency,  but urgently if possible.

## 2015-01-12 NOTE — Telephone Encounter (Signed)
Pt would like to speak to Dr. Raoul Pitch about his test results on his shoulder. jw

## 2015-01-13 NOTE — Telephone Encounter (Signed)
LVM for patient to call back to find out from him where and when he would like to get MRI done

## 2015-01-13 NOTE — Telephone Encounter (Signed)
Pt called back. Says he will do it anywhere and as soon as possible

## 2015-01-13 NOTE — Telephone Encounter (Signed)
Informed patient Set up appointment at [place] ap[facility] Friday 29th@ 5 pm arrive at 4:45pm  Gave patient [facility] number to see if he can get an appointment there sooner.   Called GI and they need to speak with patient directly to schedule

## 2015-01-30 ENCOUNTER — Ambulatory Visit (HOSPITAL_COMMUNITY)
Admission: RE | Admit: 2015-01-30 | Discharge: 2015-01-30 | Disposition: A | Discharge status: Home / Self Care | Payer: Medicare Other | Source: Ambulatory Visit | Attending: Family Medicine | Admitting: Family Medicine

## 2015-01-30 DIAGNOSIS — M9971 Connective tissue and disc stenosis of intervertebral foramina of cervical region: Secondary | ICD-10-CM | POA: Diagnosis not present

## 2015-01-30 DIAGNOSIS — M4722 Other spondylosis with radiculopathy, cervical region: Secondary | ICD-10-CM | POA: Diagnosis not present

## 2015-01-30 DIAGNOSIS — Z981 Arthrodesis status: Secondary | ICD-10-CM | POA: Diagnosis not present

## 2015-01-30 DIAGNOSIS — M4802 Spinal stenosis, cervical region: Secondary | ICD-10-CM | POA: Diagnosis not present

## 2015-01-30 DIAGNOSIS — M5412 Radiculopathy, cervical region: Secondary | ICD-10-CM

## 2015-02-03 ENCOUNTER — Encounter: Payer: Self-pay | Admitting: Family Medicine

## 2015-02-03 ENCOUNTER — Telehealth: Payer: Self-pay | Admitting: Family Medicine

## 2015-02-03 NOTE — Telephone Encounter (Signed)
Will to MD for MRI results. Jazmin Hartsell,CMA

## 2015-02-03 NOTE — Telephone Encounter (Signed)
Pt called and would like to get his MRI results and also some prednisone in case his shoulder acts up since he will be going out of town. Sports administrator

## 2015-02-03 NOTE — Telephone Encounter (Signed)
Patient's MRI results have been mailed to him, since he had his neuro surgery referral placed on his last appointment and the MRI was ordered so that the neurosurgeon would have the information at his appointment. Briefly, as details would be explained to him by his neurosurgeon:  he has multiple levels of progressive degeneration, multiple disc bulges and foraminal stenosis, including at  the level he feels the symptoms in his arm. Again, the surgeon will go into detail explanation with him at his appointment. Patient requested prednisone pack to be prescribed in the event that he may have pain while he's on vacation. It would not be appropriate to prescribe a prednisone burst, without being evaluated prior for flare. Please check on his neurosurgical referral as well as it appears it has gone through, but I do not see where an appointment has been scheduled as of yet. Please make sure we can facilitate with scheduling this appointment is done as soon as possible.

## 2015-02-04 ENCOUNTER — Telehealth: Payer: Self-pay | Admitting: Family Medicine

## 2015-02-04 NOTE — Telephone Encounter (Signed)
Will forward to referral coordinator. Brian Leon, Brian Leon

## 2015-02-04 NOTE — Telephone Encounter (Signed)
Pt wants to talk to dr Raoul Pitch about his results  Is leaving town on monday

## 2015-02-04 NOTE — Telephone Encounter (Signed)
Will forward to [facility] to check status of referral.  I don't schedule referrals and don't have access to the referral work que.  Derl Barrow, RN

## 2015-02-04 NOTE — Telephone Encounter (Signed)
Received another message from patient that he is leaving Monday and [place] the results of his MRI. Message was generated yesterday for patient to be called with brief results to MRI, as the MRI was only ordered from [place] so his neurosurgeon would have it at the time of his appointment. Patient had not been call back as requested. He is leaving for [place] on Monday for one month. I called patient back, discussed briefly his MRI results, explained that his MRI results has been mailed to him a few days ago and he will have a copy for his records. Explained that his MRI will be explained in more detail by his neurosurgeon, once he has an appointment with them. It appears his insurance/referral has gone through, but I do not see where an appointment is made, and patient states that he has not been called back with an appointment.  - I will forward this to the referral coordinator so that hopefully she can get this scheduled before Friday. The appointment will have to be after he gets back from [place], which he states he will be gone for 30 days.  - Patient also asking for prophylactic prednisone in the event of flare while in [place]. As I explained this would not be appropriate for [place] to do, he states that he's having a mild flare now. I have asked him to make same-day appointment tomorrow to address this issue.

## 2015-02-05 ENCOUNTER — Ambulatory Visit (INDEPENDENT_AMBULATORY_CARE_PROVIDER_SITE_OTHER): Payer: Medicare Other | Admitting: Family Medicine

## 2015-02-05 VITALS — BP 141/87 | HR 60 | Temp 97.7°F | Wt 139.0 lb

## 2015-02-05 DIAGNOSIS — M5412 Radiculopathy, cervical region: Secondary | ICD-10-CM

## 2015-02-05 MED ORDER — PREDNISONE 50 MG PO TABS: 50.0000 mg | ORAL_TABLET | Freq: Every day | ORAL | Status: DC

## 2015-02-05 NOTE — Patient Instructions (Addendum)
Take prednisone 50mg  daily for 5 days Follow up if not getting better See neurosurgeon as soon as you are able Follow up with Dr. Raoul Pitch after you return Enjoy [place]!  Be well, Dr. Ardelia Mems

## 2015-02-05 NOTE — Progress Notes (Signed)
Patient ID: Brian Leon, male   DOB: [birth year], 69 y.o.   MRN: [id number]  HPI:  Pt presents for a same day appointment to discuss flare of L arm pain.  Feels sore in neck radiating down to L arm along back of arm. Previously has improved with course of prednisone during prior flares and requests prednisone rx today. No fevers. Normal ROM of neck. Leaving town soon for [place]. No weakness. Planning to f/u with neurosurgery after he returns.  ROS: See HPI  [place]: hx HTN, allergic rhinitis, asthma, constipation, BPH  PHYSICAL EXAM: BP 141/87 mmHg  Pulse 60  Temp(Src) 97.7 F (36.5 C) (Oral)  Wt 139 lb (63.05 kg) Gen: NAD HEENT: NCAT, full ROM of neck. No deformity Ext: full strength bilat UE. Sensation intact to light touch over bilat UE. Gait normal  ASSESSMENT/PLAN:  1. Radicular L arm pain flare: pt requesting steroid treatment, has worked for him in the past. No red flags. Will rx prednisone. I printed off his MRI results to give him since he had not yet received the copy in the mail. He will f/u with neurosurgery and with PCP after he returns from [place]: F/u with PCP & NSG after returns from [place]. Ardelia Mems, [place]

## 2015-02-16 ENCOUNTER — Other Ambulatory Visit: Payer: Self-pay | Admitting: Family Medicine

## 2015-03-16 ENCOUNTER — Other Ambulatory Visit: Payer: Self-pay | Admitting: Family Medicine

## 2015-03-27 DIAGNOSIS — M5412 Radiculopathy, cervical region: Secondary | ICD-10-CM | POA: Diagnosis not present

## 2015-03-27 DIAGNOSIS — M5020 Other cervical disc displacement, unspecified cervical region: Secondary | ICD-10-CM | POA: Insufficient documentation

## 2015-03-27 DIAGNOSIS — M5022 Other cervical disc displacement, mid-cervical region: Secondary | ICD-10-CM | POA: Diagnosis not present

## 2015-03-27 DIAGNOSIS — M5416 Radiculopathy, lumbar region: Secondary | ICD-10-CM | POA: Insufficient documentation

## 2015-03-27 DIAGNOSIS — R030 Elevated blood-pressure reading, without diagnosis of hypertension: Secondary | ICD-10-CM | POA: Diagnosis not present

## 2015-03-27 DIAGNOSIS — M542 Cervicalgia: Secondary | ICD-10-CM | POA: Diagnosis not present

## 2015-03-27 HISTORY — DX: Other cervical disc displacement, unspecified cervical region: M50.20

## 2015-05-05 DIAGNOSIS — M5032 Other cervical disc degeneration, mid-cervical region: Secondary | ICD-10-CM | POA: Diagnosis not present

## 2015-05-05 DIAGNOSIS — M4722 Other spondylosis with radiculopathy, cervical region: Secondary | ICD-10-CM | POA: Diagnosis not present

## 2015-05-05 DIAGNOSIS — Z981 Arthrodesis status: Secondary | ICD-10-CM | POA: Diagnosis not present

## 2015-05-05 DIAGNOSIS — M5012 Cervical disc disorder with radiculopathy, mid-cervical region: Secondary | ICD-10-CM | POA: Diagnosis not present

## 2015-05-05 DIAGNOSIS — M5022 Other cervical disc displacement, mid-cervical region: Secondary | ICD-10-CM | POA: Diagnosis not present

## 2015-05-07 ENCOUNTER — Other Ambulatory Visit: Payer: Self-pay | Admitting: Family Medicine

## 2015-05-07 ENCOUNTER — Other Ambulatory Visit: Payer: Self-pay | Admitting: *Deleted

## 2015-05-07 MED ORDER — HYDROCHLOROTHIAZIDE 12.5 MG PO CAPS: ORAL_CAPSULE | ORAL | Status: DC

## 2015-05-11 ENCOUNTER — Other Ambulatory Visit: Payer: Self-pay | Admitting: *Deleted

## 2015-05-12 MED ORDER — FLUTICASONE-SALMETEROL 500-50 MCG/DOSE IN AEPB: INHALATION_SPRAY | RESPIRATORY_TRACT | Status: DC

## 2015-05-13 DIAGNOSIS — L2081 Atopic neurodermatitis: Secondary | ICD-10-CM | POA: Diagnosis not present

## 2015-05-13 DIAGNOSIS — Z79899 Other long term (current) drug therapy: Secondary | ICD-10-CM | POA: Diagnosis not present

## 2015-05-13 DIAGNOSIS — Z85828 Personal history of other malignant neoplasm of skin: Secondary | ICD-10-CM | POA: Diagnosis not present

## 2015-05-13 DIAGNOSIS — L281 Prurigo nodularis: Secondary | ICD-10-CM | POA: Diagnosis not present

## 2015-05-13 DIAGNOSIS — L570 Actinic keratosis: Secondary | ICD-10-CM | POA: Diagnosis not present

## 2015-05-20 DIAGNOSIS — M5412 Radiculopathy, cervical region: Secondary | ICD-10-CM | POA: Diagnosis not present

## 2015-05-20 DIAGNOSIS — M542 Cervicalgia: Secondary | ICD-10-CM | POA: Diagnosis not present

## 2015-06-11 ENCOUNTER — Ambulatory Visit (HOSPITAL_COMMUNITY)
Admission: RE | Admit: 2015-06-11 | Discharge: 2015-06-11 | Disposition: A | Discharge status: Home / Self Care | Payer: Medicare Other | Source: Ambulatory Visit | Attending: Urology | Admitting: Urology

## 2015-06-11 ENCOUNTER — Other Ambulatory Visit: Payer: Self-pay | Admitting: Urology

## 2015-06-11 DIAGNOSIS — C641 Malignant neoplasm of right kidney, except renal pelvis: Secondary | ICD-10-CM

## 2015-06-11 DIAGNOSIS — J449 Chronic obstructive pulmonary disease, unspecified: Secondary | ICD-10-CM | POA: Diagnosis not present

## 2015-06-11 DIAGNOSIS — Z87891 Personal history of nicotine dependence: Secondary | ICD-10-CM | POA: Insufficient documentation

## 2015-06-11 DIAGNOSIS — J45909 Unspecified asthma, uncomplicated: Secondary | ICD-10-CM | POA: Insufficient documentation

## 2015-06-11 DIAGNOSIS — Z905 Acquired absence of kidney: Secondary | ICD-10-CM | POA: Insufficient documentation

## 2015-06-11 DIAGNOSIS — I10 Essential (primary) hypertension: Secondary | ICD-10-CM | POA: Insufficient documentation

## 2015-06-11 DIAGNOSIS — N2889 Other specified disorders of kidney and ureter: Secondary | ICD-10-CM | POA: Diagnosis not present

## 2015-06-11 DIAGNOSIS — Z9889 Other specified postprocedural states: Secondary | ICD-10-CM | POA: Diagnosis not present

## 2015-06-18 DIAGNOSIS — N200 Calculus of kidney: Secondary | ICD-10-CM | POA: Diagnosis not present

## 2015-06-18 DIAGNOSIS — N3943 Post-void dribbling: Secondary | ICD-10-CM | POA: Diagnosis not present

## 2015-06-18 DIAGNOSIS — C641 Malignant neoplasm of right kidney, except renal pelvis: Secondary | ICD-10-CM | POA: Diagnosis not present

## 2015-06-18 DIAGNOSIS — N401 Enlarged prostate with lower urinary tract symptoms: Secondary | ICD-10-CM | POA: Diagnosis not present

## 2015-06-18 DIAGNOSIS — Q646 Congenital diverticulum of bladder: Secondary | ICD-10-CM | POA: Diagnosis not present

## 2015-07-15 DIAGNOSIS — M542 Cervicalgia: Secondary | ICD-10-CM | POA: Diagnosis not present

## 2015-07-15 DIAGNOSIS — M5022 Other cervical disc displacement, mid-cervical region, unspecified level: Secondary | ICD-10-CM | POA: Diagnosis not present

## 2015-07-15 DIAGNOSIS — M5412 Radiculopathy, cervical region: Secondary | ICD-10-CM | POA: Diagnosis not present

## 2015-07-15 DIAGNOSIS — R030 Elevated blood-pressure reading, without diagnosis of hypertension: Secondary | ICD-10-CM | POA: Diagnosis not present

## 2015-08-19 DIAGNOSIS — L298 Other pruritus: Secondary | ICD-10-CM | POA: Diagnosis not present

## 2015-08-19 DIAGNOSIS — Z79899 Other long term (current) drug therapy: Secondary | ICD-10-CM | POA: Diagnosis not present

## 2015-08-19 DIAGNOSIS — L2081 Atopic neurodermatitis: Secondary | ICD-10-CM | POA: Diagnosis not present

## 2015-08-19 DIAGNOSIS — Z85828 Personal history of other malignant neoplasm of skin: Secondary | ICD-10-CM | POA: Diagnosis not present

## 2015-08-19 DIAGNOSIS — Z872 Personal history of diseases of the skin and subcutaneous tissue: Secondary | ICD-10-CM | POA: Diagnosis not present

## 2015-08-19 DIAGNOSIS — L281 Prurigo nodularis: Secondary | ICD-10-CM | POA: Diagnosis not present

## 2015-11-09 ENCOUNTER — Other Ambulatory Visit: Payer: Self-pay | Admitting: *Deleted

## 2015-11-09 MED ORDER — FLUTICASONE-SALMETEROL 500-50 MCG/DOSE IN AEPB: INHALATION_SPRAY | RESPIRATORY_TRACT | Status: DC

## 2015-11-18 DIAGNOSIS — I10 Essential (primary) hypertension: Secondary | ICD-10-CM | POA: Diagnosis not present

## 2015-11-18 DIAGNOSIS — L2081 Atopic neurodermatitis: Secondary | ICD-10-CM | POA: Diagnosis not present

## 2015-11-18 DIAGNOSIS — Z79899 Other long term (current) drug therapy: Secondary | ICD-10-CM | POA: Diagnosis not present

## 2015-11-18 DIAGNOSIS — L281 Prurigo nodularis: Secondary | ICD-10-CM | POA: Diagnosis not present

## 2015-11-18 DIAGNOSIS — L570 Actinic keratosis: Secondary | ICD-10-CM | POA: Diagnosis not present

## 2015-12-22 ENCOUNTER — Other Ambulatory Visit: Payer: Self-pay | Admitting: Urology

## 2015-12-22 ENCOUNTER — Ambulatory Visit (HOSPITAL_COMMUNITY)
Admission: RE | Admit: 2015-12-22 | Discharge: 2015-12-22 | Disposition: A | Discharge status: Home / Self Care | Payer: Medicare Other | Source: Ambulatory Visit | Attending: Urology | Admitting: Urology

## 2015-12-22 ENCOUNTER — Other Ambulatory Visit (HOSPITAL_COMMUNITY): Payer: Medicare Other

## 2015-12-22 DIAGNOSIS — N202 Calculus of kidney with calculus of ureter: Secondary | ICD-10-CM | POA: Diagnosis not present

## 2015-12-22 DIAGNOSIS — N281 Cyst of kidney, acquired: Secondary | ICD-10-CM | POA: Diagnosis not present

## 2015-12-22 DIAGNOSIS — C641 Malignant neoplasm of right kidney, except renal pelvis: Secondary | ICD-10-CM | POA: Diagnosis not present

## 2015-12-22 DIAGNOSIS — J45909 Unspecified asthma, uncomplicated: Secondary | ICD-10-CM | POA: Diagnosis not present

## 2016-01-04 DIAGNOSIS — C641 Malignant neoplasm of right kidney, except renal pelvis: Secondary | ICD-10-CM | POA: Diagnosis not present

## 2016-01-04 DIAGNOSIS — Z0000 Encounter for general adult medical examination without abnormal findings: Secondary | ICD-10-CM | POA: Diagnosis not present

## 2016-01-04 DIAGNOSIS — N200 Calculus of kidney: Secondary | ICD-10-CM | POA: Diagnosis not present

## 2016-01-04 DIAGNOSIS — N210 Calculus in bladder: Secondary | ICD-10-CM | POA: Diagnosis not present

## 2016-01-04 DIAGNOSIS — N5201 Erectile dysfunction due to arterial insufficiency: Secondary | ICD-10-CM | POA: Diagnosis not present

## 2016-01-21 ENCOUNTER — Other Ambulatory Visit: Payer: Self-pay | Admitting: *Deleted

## 2016-01-21 MED ORDER — FLUTICASONE-SALMETEROL 500-50 MCG/DOSE IN AEPB: INHALATION_SPRAY | RESPIRATORY_TRACT | Status: DC

## 2016-01-21 NOTE — Telephone Encounter (Signed)
Will approve one refill. Patient requires a follow-up visit for future refills.

## 2016-01-22 NOTE — Telephone Encounter (Signed)
LVM for pt to call the office. If he calls, please schedule him an appt for further refills. Ottis Stain, CMA

## 2016-02-02 NOTE — Telephone Encounter (Signed)
Contacted pt and scheduled an appointment for 03/01/2016. Katharina Caper, April D, [place]

## 2016-03-01 ENCOUNTER — Ambulatory Visit (INDEPENDENT_AMBULATORY_CARE_PROVIDER_SITE_OTHER): Payer: Medicare Other | Admitting: Family Medicine

## 2016-03-01 ENCOUNTER — Other Ambulatory Visit: Payer: Self-pay | Admitting: Family Medicine

## 2016-03-01 ENCOUNTER — Encounter: Payer: Self-pay | Admitting: Family Medicine

## 2016-03-01 VITALS — BP 137/67 | HR 60 | Temp 98.2°F | Ht 70.0 in | Wt 145.2 lb

## 2016-03-01 DIAGNOSIS — I10 Essential (primary) hypertension: Secondary | ICD-10-CM

## 2016-03-01 DIAGNOSIS — Z23 Encounter for immunization: Secondary | ICD-10-CM | POA: Diagnosis not present

## 2016-03-01 DIAGNOSIS — Z1159 Encounter for screening for other viral diseases: Secondary | ICD-10-CM

## 2016-03-01 DIAGNOSIS — R739 Hyperglycemia, unspecified: Secondary | ICD-10-CM

## 2016-03-01 LAB — CBC WITH DIFFERENTIAL/PLATELET
Basophils Absolute: 0 cells/uL (ref 0–200)
Basophils Relative: 0 %
Eosinophils Absolute: 1416 cells/uL — ABNORMAL HIGH (ref 15–500)
Eosinophils Relative: 12 %
HCT: 36.6 % — ABNORMAL LOW (ref 38.5–50.0)
Hemoglobin: 11.9 g/dL — ABNORMAL LOW (ref 13.2–17.1)
Lymphocytes Relative: 13 %
Lymphs Abs: 1534 cells/uL (ref 850–3900)
MCH: 30.3 pg (ref 27.0–33.0)
MCHC: 32.5 g/dL (ref 32.0–36.0)
MCV: 93.1 fL (ref 80.0–100.0)
MPV: 9.2 fL (ref 7.5–12.5)
Monocytes Absolute: 944 cells/uL (ref 200–950)
Monocytes Relative: 8 %
Neutro Abs: 7906 cells/uL — ABNORMAL HIGH (ref 1500–7800)
Neutrophils Relative %: 67 %
Platelets: 475 10*3/uL — ABNORMAL HIGH (ref 140–400)
RBC: 3.93 MIL/uL — ABNORMAL LOW (ref 4.20–5.80)
RDW: 13.3 % (ref 11.0–15.0)
WBC: 11.8 10*3/uL — ABNORMAL HIGH (ref 3.8–10.8)

## 2016-03-01 LAB — COMPLETE METABOLIC PANEL WITH GFR
ALT: 10 U/L (ref 9–46)
AST: 12 U/L (ref 10–35)
Albumin: 4 g/dL (ref 3.6–5.1)
Alkaline Phosphatase: 105 U/L (ref 40–115)
BUN: 20 mg/dL (ref 7–25)
CO2: 25 mmol/L (ref 20–31)
Calcium: 9.4 mg/dL (ref 8.6–10.3)
Chloride: 105 mmol/L (ref 98–110)
Creat: 1.03 mg/dL (ref 0.70–1.18)
GFR, Est African American: 84 mL/min (ref >=60)
GFR, Est Non African American: 72 mL/min (ref >=60)
Glucose, Bld: 87 mg/dL (ref 65–99)
Potassium: 4.3 mmol/L (ref 3.5–5.3)
Sodium: 139 mmol/L (ref 135–146)
Total Bilirubin: 0.5 mg/dL (ref 0.2–1.2)
Total Protein: 7 g/dL (ref 6.1–8.1)

## 2016-03-01 LAB — TSH: TSH: 1.22 mIU/L (ref 0.40–4.50)

## 2016-03-01 LAB — POCT GLYCOSYLATED HEMOGLOBIN (HGB A1C): Hemoglobin A1C: 5.3

## 2016-03-01 MED ORDER — HYDROCHLOROTHIAZIDE 12.5 MG PO CAPS: 12.5000 mg | ORAL_CAPSULE | Freq: Every day | ORAL | Status: DC

## 2016-03-01 MED ORDER — FLUTICASONE-SALMETEROL 500-50 MCG/DOSE IN AEPB: INHALATION_SPRAY | RESPIRATORY_TRACT | Status: DC

## 2016-03-01 MED ORDER — PNEUMOCOCCAL 13-VAL CONJ VACC IM SUSP: 0.5000 mL | INTRAMUSCULAR | Status: DC

## 2016-03-01 MED ORDER — FLUTICASONE PROPIONATE 50 MCG/ACT NA SUSP: 2.0000 | Freq: Every day | NASAL | Status: DC

## 2016-03-01 MED ORDER — ALBUTEROL SULFATE HFA 108 (90 BASE) MCG/ACT IN AERS: 1.0000 | INHALATION_SPRAY | Freq: Four times a day (QID) | RESPIRATORY_TRACT | Status: DC | PRN

## 2016-03-01 MED ORDER — POLYETHYLENE GLYCOL 3350 17 G PO PACK: 17.0000 g | PACK | Freq: Every day | ORAL | Status: DC

## 2016-03-01 NOTE — Addendum Note (Signed)
Addended by: Londell Moh T on: 03/01/2016 05:16 PM   Modules accepted: Orders, SmartSet

## 2016-03-01 NOTE — Addendum Note (Signed)
Addended by: Londell Moh T on: 03/01/2016 04:58 PM   Modules accepted: Orders, SmartSet
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
## 2016-03-01 NOTE — Patient Instructions (Signed)
Thank you for coming to see me today. It was a pleasure. Today we talked about:   I am getting lab work. Please expect a letter in the mail.  Please make an appointment to see your new doctor for follow-up.  If you have any questions or concerns, please do not hesitate to call the office at [phone].  Sincerely,  Cordelia Poche, MD

## 2016-03-02 LAB — HEPATITIS C ANTIBODY: HCV Ab: NEGATIVE

## 2016-03-07 ENCOUNTER — Other Ambulatory Visit: Payer: Self-pay | Admitting: *Deleted

## 2016-03-07 LAB — PATHOLOGIST SMEAR REVIEW

## 2016-03-08 MED ORDER — FLUTICASONE PROPIONATE 50 MCG/ACT NA SUSP: 2.0000 | Freq: Every day | NASAL | Status: DC

## 2016-03-08 MED ORDER — FLUTICASONE-SALMETEROL 500-50 MCG/DOSE IN AEPB: INHALATION_SPRAY | RESPIRATORY_TRACT | Status: DC

## 2016-03-09 ENCOUNTER — Encounter: Payer: Self-pay | Admitting: Family Medicine

## 2016-03-09 DIAGNOSIS — D649 Anemia, unspecified: Secondary | ICD-10-CM

## 2016-03-11 DIAGNOSIS — K432 Incisional hernia without obstruction or gangrene: Secondary | ICD-10-CM | POA: Diagnosis not present

## 2016-03-31 ENCOUNTER — Ambulatory Visit: Payer: Self-pay | Admitting: General Surgery

## 2016-05-03 DIAGNOSIS — Z872 Personal history of diseases of the skin and subcutaneous tissue: Secondary | ICD-10-CM | POA: Diagnosis not present

## 2016-05-03 DIAGNOSIS — Z79899 Other long term (current) drug therapy: Secondary | ICD-10-CM | POA: Diagnosis not present

## 2016-05-03 DIAGNOSIS — L281 Prurigo nodularis: Secondary | ICD-10-CM | POA: Diagnosis not present

## 2016-05-03 DIAGNOSIS — Z85828 Personal history of other malignant neoplasm of skin: Secondary | ICD-10-CM | POA: Diagnosis not present

## 2016-05-03 DIAGNOSIS — L209 Atopic dermatitis, unspecified: Secondary | ICD-10-CM | POA: Diagnosis not present

## 2016-05-03 DIAGNOSIS — L2081 Atopic neurodermatitis: Secondary | ICD-10-CM | POA: Diagnosis not present

## 2016-05-09 ENCOUNTER — Other Ambulatory Visit: Payer: Self-pay | Admitting: *Deleted

## 2016-05-09 DIAGNOSIS — K432 Incisional hernia without obstruction or gangrene: Secondary | ICD-10-CM | POA: Diagnosis not present

## 2016-05-09 MED ORDER — FLUTICASONE PROPIONATE 50 MCG/ACT NA SUSP: 2.0000 | Freq: Every day | NASAL | 12 refills | Status: DC

## 2016-05-25 DIAGNOSIS — M542 Cervicalgia: Secondary | ICD-10-CM | POA: Diagnosis not present

## 2016-05-25 DIAGNOSIS — M5412 Radiculopathy, cervical region: Secondary | ICD-10-CM | POA: Diagnosis not present

## 2016-05-25 DIAGNOSIS — M5022 Other cervical disc displacement, mid-cervical region, unspecified level: Secondary | ICD-10-CM | POA: Diagnosis not present

## 2016-06-01 DIAGNOSIS — M5022 Other cervical disc displacement, mid-cervical region, unspecified level: Secondary | ICD-10-CM | POA: Diagnosis not present

## 2016-06-01 DIAGNOSIS — M4802 Spinal stenosis, cervical region: Secondary | ICD-10-CM | POA: Diagnosis not present

## 2016-06-30 DIAGNOSIS — S62346A Nondisplaced fracture of base of fifth metacarpal bone, right hand, initial encounter for closed fracture: Secondary | ICD-10-CM | POA: Diagnosis not present

## 2016-06-30 DIAGNOSIS — M79641 Pain in right hand: Secondary | ICD-10-CM | POA: Diagnosis not present

## 2016-06-30 DIAGNOSIS — S01111A Laceration without foreign body of right eyelid and periocular area, initial encounter: Secondary | ICD-10-CM | POA: Diagnosis not present

## 2016-06-30 DIAGNOSIS — S62316A Displaced fracture of base of fifth metacarpal bone, right hand, initial encounter for closed fracture: Secondary | ICD-10-CM | POA: Diagnosis not present

## 2016-06-30 DIAGNOSIS — S80211A Abrasion, right knee, initial encounter: Secondary | ICD-10-CM | POA: Diagnosis not present

## 2016-06-30 DIAGNOSIS — S0081XA Abrasion of other part of head, initial encounter: Secondary | ICD-10-CM | POA: Diagnosis not present

## 2016-07-01 ENCOUNTER — Telehealth: Payer: Self-pay | Admitting: Family Medicine

## 2016-07-01 ENCOUNTER — Encounter: Payer: Self-pay | Admitting: Family Medicine

## 2016-07-01 NOTE — Telephone Encounter (Signed)
Pt also stated it is ok to leave a voicemail. Thanks! ep

## 2016-07-01 NOTE — Telephone Encounter (Signed)
[place], broke [place]. Went to ER there, needs a referral for ortho. Please call [phone] to reach pt. Please advise. Thanks! ep

## 2016-07-04 ENCOUNTER — Other Ambulatory Visit: Payer: Self-pay | Admitting: Family Medicine

## 2016-07-04 ENCOUNTER — Telehealth: Payer: Self-pay | Admitting: Family Medicine

## 2016-07-04 DIAGNOSIS — S6990XD Unspecified injury of unspecified wrist, hand and finger(s), subsequent encounter: Secondary | ICD-10-CM

## 2016-07-04 DIAGNOSIS — S6991XS Unspecified injury of right wrist, hand and finger(s), sequela: Secondary | ICD-10-CM

## 2016-07-04 NOTE — Telephone Encounter (Signed)
Sister called to check the status of the referral for her brother to see ortho. [place]'t work. Please call his sister at [phone] with the date and time of the appointment. jw

## 2016-07-04 NOTE — Telephone Encounter (Signed)
This was done this morning  

## 2016-07-04 NOTE — Telephone Encounter (Signed)
Referral placed.

## 2016-07-05 NOTE — Telephone Encounter (Signed)
LVM for pt to call back to inform him of below. Zimmerman Rumple, April D, CMA  

## 2016-07-06 NOTE — Telephone Encounter (Signed)
This is being handled in another phone note. Zimmerman Rumple, Lavonda Thal D, CMA  

## 2016-07-08 DIAGNOSIS — S62316A Displaced fracture of base of fifth metacarpal bone, right hand, initial encounter for closed fracture: Secondary | ICD-10-CM | POA: Diagnosis not present

## 2016-07-11 DIAGNOSIS — Z6821 Body mass index (BMI) 21.0-21.9, adult: Secondary | ICD-10-CM | POA: Diagnosis not present

## 2016-07-11 DIAGNOSIS — M542 Cervicalgia: Secondary | ICD-10-CM | POA: Diagnosis not present

## 2016-07-11 DIAGNOSIS — M4712 Other spondylosis with myelopathy, cervical region: Secondary | ICD-10-CM | POA: Diagnosis not present

## 2016-07-11 DIAGNOSIS — I10 Essential (primary) hypertension: Secondary | ICD-10-CM | POA: Diagnosis not present

## 2016-07-11 DIAGNOSIS — M5023 Other cervical disc displacement, cervicothoracic region: Secondary | ICD-10-CM | POA: Diagnosis not present

## 2016-07-11 DIAGNOSIS — M5412 Radiculopathy, cervical region: Secondary | ICD-10-CM | POA: Diagnosis not present

## 2016-07-11 DIAGNOSIS — M5022 Other cervical disc displacement, mid-cervical region, unspecified level: Secondary | ICD-10-CM | POA: Diagnosis not present

## 2016-07-12 ENCOUNTER — Other Ambulatory Visit: Payer: Self-pay | Admitting: Neurosurgery

## 2016-07-14 ENCOUNTER — Inpatient Hospital Stay (HOSPITAL_COMMUNITY): Admission: RE | Admit: 2016-07-14 | Payer: Medicare Other | Source: Ambulatory Visit

## 2016-07-15 ENCOUNTER — Other Ambulatory Visit: Payer: Self-pay

## 2016-07-15 ENCOUNTER — Encounter (HOSPITAL_COMMUNITY): Payer: Self-pay

## 2016-07-15 ENCOUNTER — Encounter (HOSPITAL_COMMUNITY)
Admission: RE | Admit: 2016-07-15 | Discharge: 2016-07-15 | Disposition: A | Discharge status: Home / Self Care | Payer: Medicare Other | Source: Ambulatory Visit | Attending: Neurosurgery | Admitting: Neurosurgery

## 2016-07-15 DIAGNOSIS — Z79899 Other long term (current) drug therapy: Secondary | ICD-10-CM | POA: Insufficient documentation

## 2016-07-15 DIAGNOSIS — Z01812 Encounter for preprocedural laboratory examination: Secondary | ICD-10-CM | POA: Insufficient documentation

## 2016-07-15 DIAGNOSIS — Z01818 Encounter for other preprocedural examination: Secondary | ICD-10-CM | POA: Diagnosis not present

## 2016-07-15 DIAGNOSIS — Z888 Allergy status to other drugs, medicaments and biological substances status: Secondary | ICD-10-CM | POA: Diagnosis not present

## 2016-07-15 DIAGNOSIS — Z882 Allergy status to sulfonamides status: Secondary | ICD-10-CM | POA: Diagnosis not present

## 2016-07-15 DIAGNOSIS — M4802 Spinal stenosis, cervical region: Secondary | ICD-10-CM | POA: Insufficient documentation

## 2016-07-15 DIAGNOSIS — Z0183 Encounter for blood typing: Secondary | ICD-10-CM | POA: Diagnosis not present

## 2016-07-15 DIAGNOSIS — R29898 Other symptoms and signs involving the musculoskeletal system: Secondary | ICD-10-CM | POA: Diagnosis not present

## 2016-07-15 HISTORY — DX: Headache: R51

## 2016-07-15 HISTORY — DX: Pneumonia, unspecified organism: J18.9

## 2016-07-15 HISTORY — DX: Family history of other specified conditions: Z84.89

## 2016-07-15 HISTORY — DX: Anemia, unspecified: D64.9

## 2016-07-15 HISTORY — DX: Personal history of other diseases of the digestive system: Z87.19

## 2016-07-15 HISTORY — DX: Headache, unspecified: R51.9

## 2016-07-15 HISTORY — DX: Presence of artificial eye: Z97.0

## 2016-07-15 LAB — TYPE AND SCREEN
ABO/RH(D): O POS
Antibody Screen: NEGATIVE

## 2016-07-15 LAB — CBC
HCT: 36.2 % — ABNORMAL LOW (ref 39.0–52.0)
Hemoglobin: 11.6 g/dL — ABNORMAL LOW (ref 13.0–17.0)
MCH: 30.7 pg (ref 26.0–34.0)
MCHC: 32 g/dL (ref 30.0–36.0)
MCV: 95.8 fL (ref 78.0–100.0)
Platelets: 435 10*3/uL — ABNORMAL HIGH (ref 150–400)
RBC: 3.78 MIL/uL — ABNORMAL LOW (ref 4.22–5.81)
RDW: 13.1 % (ref 11.5–15.5)
WBC: 14.3 10*3/uL — ABNORMAL HIGH (ref 4.0–10.5)

## 2016-07-15 LAB — BASIC METABOLIC PANEL
Anion gap: 8 (ref 5–15)
BUN: 15 mg/dL (ref 6–20)
CO2: 26 mmol/L (ref 22–32)
Calcium: 9.6 mg/dL (ref 8.9–10.3)
Chloride: 104 mmol/L (ref 101–111)
Creatinine, Ser: 1.23 mg/dL (ref 0.61–1.24)
GFR calc Af Amer: >60 mL/min (ref >60)
GFR calc non Af Amer: 57 mL/min — ABNORMAL LOW (ref >60)
Glucose, Bld: 96 mg/dL (ref 65–99)
Potassium: 3.7 mmol/L (ref 3.5–5.1)
Sodium: 138 mmol/L (ref 135–145)

## 2016-07-15 LAB — SURGICAL PCR SCREEN
MRSA, PCR: NEGATIVE
Staphylococcus aureus: POSITIVE — AB

## 2016-07-15 LAB — ABO/RH: ABO/RH(D): O POS

## 2016-07-15 NOTE — Progress Notes (Signed)
Mupirocin Ointment Rx called into [facility] on [address] for positive PCR of Staph. Pt notified and voiced understanding.

## 2016-07-15 NOTE — Pre-Procedure Instructions (Signed)
Brian Leon  07/15/2016     Your procedure is scheduled on Tuesday, July 19, 2016 at 10:40 AM.   Report to [facility] Entrance "A" Admitting Office at 7:40 AM.   Call this number if you have problems the morning of surgery: [phone]   Questions prior to day of surgery, please call [phone] between 8 & 4 PM.   Remember:  Do not eat food or drink liquids after midnight Monday, 07/18/16.  Take these medicines the morning of surgery with A SIP OF WATER: Cyclosporine, Advair inhaler, Flonase, Albuterol inhaler - if needed (bring this inhaler with you morning of surgery)  Do not use Aspirin products or NSAIDS (Ibuprofen, Aleve, etc.) prior to surgery.   Do not wear jewelry.  Do not wear lotions, powders, or cologne.  Men may shave face and neck.  Do not bring valuables to the hospital.  [facility] is not responsible for any belongings or valuables.  Contacts, dentures or bridgework may not be worn into surgery.  Leave your suitcase in the car.  After surgery it may be brought to your room.  For patients admitted to the hospital, discharge time will be determined by your treatment team.  Special instructions:  [facility] - Preparing for Surgery  Before surgery, you can play an important role.  Because skin is not sterile, your skin needs to be as free of germs as possible.  You can reduce the number of germs on you skin by washing with CHG (chlorahexidine gluconate) soap before surgery.  CHG is an antiseptic cleaner which kills germs and bonds with the skin to continue killing germs even after washing.  Please DO NOT use if you have an allergy to CHG or antibacterial soaps.  If your skin becomes reddened/irritated stop using the CHG and inform your nurse when you arrive at Short Stay.  Do not shave (including legs and underarms) for at least 48 hours prior to the first CHG shower.  You may shave your face.  Please follow these instructions carefully:   1.   Shower with CHG Soap the night before surgery and the                    morning of Surgery.  2.  If you choose to wash your hair, wash your hair first as usual with your       normal shampoo.  3.  After you shampoo, rinse your hair and body thoroughly to remove the shampoo.  4.  Use CHG as you would any other liquid soap.  You can apply chg directly       to the skin and wash gently with scrungie or a clean washcloth.  5.  Apply the CHG Soap to your body ONLY FROM THE NECK DOWN.        Do not use on open wounds or open sores.  Avoid contact with your eyes, ears, mouth and genitals (private parts).  Wash genitals (private parts) with your normal soap.  6.  Wash thoroughly, paying special attention to the area where your surgery        will be performed.  7.  Thoroughly rinse your body with warm water from the neck down.  8.  DO NOT shower/wash with your normal soap after using and rinsing off       the CHG Soap.  9.  Pat yourself dry with a clean towel.  10.  Wear clean pajamas.            11.  Place clean sheets on your bed the night of your first shower and do not        sleep with pets.  Day of Surgery  Do not apply any lotions the morning of surgery.  Please wear clean clothes to the hospital.   Please read over the following fact sheets that you were given. Pain Booklet, Coughing and Deep Breathing, MRSA Information and Surgical Site Infection Prevention

## 2016-07-15 NOTE — Progress Notes (Signed)
Pt denies cardiac history, chest pain or sob. Pt's PCP is [facility].

## 2016-07-18 MED ORDER — CEFAZOLIN SODIUM-DEXTROSE 2-4 GM/100ML-% IV SOLN
2.0000 g | INTRAVENOUS | Status: AC
  Administered 2016-07-19: 2 g via INTRAVENOUS
  Filled 2016-07-18: qty 100

## 2016-07-19 ENCOUNTER — Inpatient Hospital Stay (HOSPITAL_COMMUNITY): Payer: Medicare Other | Admitting: Anesthesiology

## 2016-07-19 ENCOUNTER — Inpatient Hospital Stay (HOSPITAL_COMMUNITY): Payer: Medicare Other

## 2016-07-19 ENCOUNTER — Encounter (HOSPITAL_COMMUNITY): Payer: Self-pay | Admitting: *Deleted

## 2016-07-19 ENCOUNTER — Encounter (HOSPITAL_COMMUNITY)
Admission: RE | Disposition: A | Discharge status: Home / Self Care | Payer: Self-pay | Source: Ambulatory Visit | Attending: Neurosurgery

## 2016-07-19 ENCOUNTER — Inpatient Hospital Stay (HOSPITAL_COMMUNITY)
Admission: RE | Admit: 2016-07-19 | Discharge: 2016-07-20 | DRG: 460 | Disposition: A | Discharge status: Home / Self Care | Payer: Medicare Other | Source: Ambulatory Visit | Attending: Neurosurgery | Admitting: Neurosurgery

## 2016-07-19 DIAGNOSIS — M4712 Other spondylosis with myelopathy, cervical region: Secondary | ICD-10-CM | POA: Diagnosis not present

## 2016-07-19 DIAGNOSIS — J45909 Unspecified asthma, uncomplicated: Secondary | ICD-10-CM | POA: Diagnosis present

## 2016-07-19 DIAGNOSIS — Z419 Encounter for procedure for purposes other than remedying health state, unspecified: Secondary | ICD-10-CM

## 2016-07-19 DIAGNOSIS — M4804 Spinal stenosis, thoracic region: Secondary | ICD-10-CM | POA: Diagnosis present

## 2016-07-19 DIAGNOSIS — D649 Anemia, unspecified: Secondary | ICD-10-CM | POA: Diagnosis not present

## 2016-07-19 DIAGNOSIS — M5001 Cervical disc disorder with myelopathy,  high cervical region: Secondary | ICD-10-CM | POA: Diagnosis present

## 2016-07-19 DIAGNOSIS — I10 Essential (primary) hypertension: Secondary | ICD-10-CM | POA: Diagnosis present

## 2016-07-19 DIAGNOSIS — M4722 Other spondylosis with radiculopathy, cervical region: Secondary | ICD-10-CM | POA: Diagnosis present

## 2016-07-19 DIAGNOSIS — M4802 Spinal stenosis, cervical region: Secondary | ICD-10-CM | POA: Diagnosis present

## 2016-07-19 DIAGNOSIS — M4803 Spinal stenosis, cervicothoracic region: Secondary | ICD-10-CM | POA: Diagnosis not present

## 2016-07-19 DIAGNOSIS — M5104 Intervertebral disc disorders with myelopathy, thoracic region: Secondary | ICD-10-CM | POA: Diagnosis present

## 2016-07-19 DIAGNOSIS — Z981 Arthrodesis status: Secondary | ICD-10-CM | POA: Diagnosis not present

## 2016-07-19 DIAGNOSIS — Z87891 Personal history of nicotine dependence: Secondary | ICD-10-CM | POA: Diagnosis not present

## 2016-07-19 DIAGNOSIS — M4323 Fusion of spine, cervicothoracic region: Secondary | ICD-10-CM | POA: Diagnosis not present

## 2016-07-19 DIAGNOSIS — M542 Cervicalgia: Secondary | ICD-10-CM | POA: Diagnosis not present

## 2016-07-19 HISTORY — PX: ANTERIOR CERVICAL DECOMP/DISCECTOMY FUSION: SHX1161

## 2016-07-19 HISTORY — DX: Other spondylosis with radiculopathy, cervical region: M47.22

## 2016-07-19 SURGERY — ANTERIOR CERVICAL DECOMPRESSION/DISCECTOMY FUSION 3 LEVEL/HARDWARE REMOVAL
Anesthesia: General | Site: Spine Cervical | Laterality: Right

## 2016-07-19 MED ORDER — SODIUM CHLORIDE 0.9% FLUSH: 3.0000 mL | Freq: Two times a day (BID) | INTRAVENOUS | Status: DC

## 2016-07-19 MED ORDER — ROCURONIUM BROMIDE 100 MG/10ML IV SOLN
INTRAVENOUS | Status: DC | PRN
  Administered 2016-07-19: 50 mg via INTRAVENOUS

## 2016-07-19 MED ORDER — MOMETASONE FURO-FORMOTEROL FUM 200-5 MCG/ACT IN AERO
2.0000 | INHALATION_SPRAY | Freq: Two times a day (BID) | RESPIRATORY_TRACT | Status: DC
  Filled 2016-07-19: qty 8.8

## 2016-07-19 MED ORDER — ONDANSETRON HCL 4 MG/2ML IJ SOLN
4.0000 mg | INTRAMUSCULAR | Status: DC | PRN
  Administered 2016-07-19: 4 mg via INTRAVENOUS
  Filled 2016-07-19: qty 2

## 2016-07-19 MED ORDER — FINASTERIDE 5 MG PO TABS
5.0000 mg | ORAL_TABLET | Freq: Every day | ORAL | Status: DC
  Administered 2016-07-19: 5 mg via ORAL
  Filled 2016-07-19: qty 1

## 2016-07-19 MED ORDER — DOCUSATE SODIUM 100 MG PO CAPS
100.0000 mg | ORAL_CAPSULE | Freq: Two times a day (BID) | ORAL | Status: DC
  Administered 2016-07-19: 100 mg via ORAL
  Filled 2016-07-19: qty 1

## 2016-07-19 MED ORDER — 0.9 % SODIUM CHLORIDE (POUR BTL) OPTIME
TOPICAL | Status: DC | PRN
  Administered 2016-07-19: 1000 mL

## 2016-07-19 MED ORDER — SODIUM CHLORIDE 0.9% FLUSH: 3.0000 mL | INTRAVENOUS | Status: DC | PRN

## 2016-07-19 MED ORDER — SUCCINYLCHOLINE CHLORIDE 200 MG/10ML IV SOSY
PREFILLED_SYRINGE | INTRAVENOUS | Status: AC
  Filled 2016-07-19: qty 20

## 2016-07-19 MED ORDER — HYDROMORPHONE HCL 1 MG/ML IJ SOLN
0.2500 mg | INTRAMUSCULAR | Status: DC | PRN
  Administered 2016-07-19: 0.5 mg via INTRAVENOUS

## 2016-07-19 MED ORDER — PHENYLEPHRINE HCL 10 MG/ML IJ SOLN
INTRAMUSCULAR | Status: DC | PRN
  Administered 2016-07-19: 80 ug via INTRAVENOUS
  Administered 2016-07-19 (×2): 40 ug via INTRAVENOUS

## 2016-07-19 MED ORDER — CHLORHEXIDINE GLUCONATE CLOTH 2 % EX PADS: 6.0000 | MEDICATED_PAD | Freq: Once | CUTANEOUS | Status: DC

## 2016-07-19 MED ORDER — DEXAMETHASONE SODIUM PHOSPHATE 4 MG/ML IJ SOLN
INTRAMUSCULAR | Status: DC | PRN
  Administered 2016-07-19: 10 mg via INTRAVENOUS

## 2016-07-19 MED ORDER — FENTANYL CITRATE (PF) 100 MCG/2ML IJ SOLN
INTRAMUSCULAR | Status: AC
  Filled 2016-07-19: qty 2

## 2016-07-19 MED ORDER — ROCURONIUM BROMIDE 10 MG/ML (PF) SYRINGE
PREFILLED_SYRINGE | INTRAVENOUS | Status: AC
  Filled 2016-07-19: qty 10

## 2016-07-19 MED ORDER — KCL IN DEXTROSE-NACL 20-5-0.45 MEQ/L-%-% IV SOLN: INTRAVENOUS | Status: DC

## 2016-07-19 MED ORDER — LIDOCAINE-EPINEPHRINE 1 %-1:100000 IJ SOLN
INTRAMUSCULAR | Status: DC | PRN
  Administered 2016-07-19: 3.5 mL

## 2016-07-19 MED ORDER — HYDROMORPHONE HCL 1 MG/ML IJ SOLN
INTRAMUSCULAR | Status: AC
  Filled 2016-07-19: qty 1

## 2016-07-19 MED ORDER — THROMBIN 5000 UNITS EX SOLR
CUTANEOUS | Status: AC
  Filled 2016-07-19: qty 5000

## 2016-07-19 MED ORDER — PROPOFOL 10 MG/ML IV BOLUS
INTRAVENOUS | Status: DC | PRN
  Administered 2016-07-19: 120 mg via INTRAVENOUS

## 2016-07-19 MED ORDER — LIDOCAINE HCL (CARDIAC) 20 MG/ML IV SOLN
INTRAVENOUS | Status: DC | PRN
  Administered 2016-07-19: 60 mg via INTRAVENOUS

## 2016-07-19 MED ORDER — HYDROCHLOROTHIAZIDE 12.5 MG PO CAPS
12.5000 mg | ORAL_CAPSULE | Freq: Two times a day (BID) | ORAL | Status: DC
  Administered 2016-07-19: 12.5 mg via ORAL
  Filled 2016-07-19: qty 1

## 2016-07-19 MED ORDER — POLYETHYLENE GLYCOL 3350 17 G PO PACK: 17.0000 g | PACK | Freq: Every day | ORAL | Status: DC | PRN

## 2016-07-19 MED ORDER — MENTHOL 3 MG MT LOZG: 1.0000 | LOZENGE | OROMUCOSAL | Status: DC | PRN

## 2016-07-19 MED ORDER — METHOCARBAMOL 500 MG PO TABS
500.0000 mg | ORAL_TABLET | Freq: Four times a day (QID) | ORAL | Status: DC | PRN
  Administered 2016-07-19 – 2016-07-20 (×2): 500 mg via ORAL
  Filled 2016-07-19 (×3): qty 1

## 2016-07-19 MED ORDER — LIDOCAINE-EPINEPHRINE 1 %-1:100000 IJ SOLN
INTRAMUSCULAR | Status: AC
  Filled 2016-07-19: qty 1

## 2016-07-19 MED ORDER — BUPIVACAINE HCL (PF) 0.5 % IJ SOLN
INTRAMUSCULAR | Status: DC | PRN
  Administered 2016-07-19: 3.5 mL

## 2016-07-19 MED ORDER — ALBUTEROL SULFATE (2.5 MG/3ML) 0.083% IN NEBU: 3.0000 mL | INHALATION_SOLUTION | Freq: Four times a day (QID) | RESPIRATORY_TRACT | Status: DC | PRN

## 2016-07-19 MED ORDER — THROMBIN 5000 UNITS EX SOLR
OROMUCOSAL | Status: DC | PRN
  Administered 2016-07-19: 13:00:00 via TOPICAL

## 2016-07-19 MED ORDER — LACTATED RINGERS IV SOLN
INTRAVENOUS | Status: DC
  Administered 2016-07-19 (×3): via INTRAVENOUS

## 2016-07-19 MED ORDER — ONDANSETRON HCL 4 MG/2ML IJ SOLN
INTRAMUSCULAR | Status: DC | PRN
  Administered 2016-07-19: 4 mg via INTRAVENOUS

## 2016-07-19 MED ORDER — HYDROMORPHONE HCL 1 MG/ML IJ SOLN
0.5000 mg | INTRAMUSCULAR | Status: DC | PRN
  Administered 2016-07-19 (×2): 1 mg via INTRAVENOUS
  Filled 2016-07-19 (×2): qty 1

## 2016-07-19 MED ORDER — SUGAMMADEX SODIUM 200 MG/2ML IV SOLN
INTRAVENOUS | Status: DC | PRN
  Administered 2016-07-19: 200 mg via INTRAVENOUS

## 2016-07-19 MED ORDER — CEFAZOLIN IN D5W 1 GM/50ML IV SOLN
1.0000 g | Freq: Three times a day (TID) | INTRAVENOUS | Status: AC
  Administered 2016-07-19 – 2016-07-20 (×2): 1 g via INTRAVENOUS
  Filled 2016-07-19 (×2): qty 50

## 2016-07-19 MED ORDER — THROMBIN 20000 UNITS EX SOLR
CUTANEOUS | Status: AC
  Filled 2016-07-19: qty 20000

## 2016-07-19 MED ORDER — MIDAZOLAM HCL 5 MG/5ML IJ SOLN
INTRAMUSCULAR | Status: DC | PRN
  Administered 2016-07-19 (×2): 1 mg via INTRAVENOUS

## 2016-07-19 MED ORDER — FAMOTIDINE 20 MG PO TABS
20.0000 mg | ORAL_TABLET | Freq: Two times a day (BID) | ORAL | Status: DC
  Administered 2016-07-19: 20 mg via ORAL
  Filled 2016-07-19: qty 1

## 2016-07-19 MED ORDER — DEXTROSE 5 % IV SOLN
INTRAVENOUS | Status: DC | PRN
  Administered 2016-07-19: 20 ug/min via INTRAVENOUS

## 2016-07-19 MED ORDER — ONDANSETRON HCL 4 MG/2ML IJ SOLN
4.0000 mg | Freq: Once | INTRAMUSCULAR | Status: AC
  Administered 2016-07-19: 4 mg via INTRAVENOUS

## 2016-07-19 MED ORDER — ONDANSETRON HCL 4 MG/2ML IJ SOLN
INTRAMUSCULAR | Status: AC
Start: 2016-07-19 — End: 2016-07-20
  Filled 2016-07-19: qty 2

## 2016-07-19 MED ORDER — LIDOCAINE 2% (20 MG/ML) 5 ML SYRINGE
INTRAMUSCULAR | Status: AC
  Filled 2016-07-19: qty 10

## 2016-07-19 MED ORDER — OXYCODONE HCL 5 MG PO TABS
5.0000 mg | ORAL_TABLET | ORAL | Status: DC | PRN
  Administered 2016-07-19 – 2016-07-20 (×3): 10 mg via ORAL
  Filled 2016-07-19 (×3): qty 2

## 2016-07-19 MED ORDER — BISACODYL 10 MG RE SUPP: 10.0000 mg | Freq: Every day | RECTAL | Status: DC | PRN

## 2016-07-19 MED ORDER — FLUTICASONE PROPIONATE 50 MCG/ACT NA SUSP
2.0000 | Freq: Every day | NASAL | Status: DC
  Filled 2016-07-19: qty 16

## 2016-07-19 MED ORDER — FENTANYL CITRATE (PF) 100 MCG/2ML IJ SOLN
INTRAMUSCULAR | Status: DC | PRN
  Administered 2016-07-19: 100 ug via INTRAVENOUS
  Administered 2016-07-19: 50 ug via INTRAVENOUS
  Administered 2016-07-19: 100 ug via INTRAVENOUS
  Administered 2016-07-19 (×3): 50 ug via INTRAVENOUS
  Administered 2016-07-19: 100 ug via INTRAVENOUS

## 2016-07-19 MED ORDER — METHOCARBAMOL 1000 MG/10ML IJ SOLN
500.0000 mg | Freq: Four times a day (QID) | INTRAVENOUS | Status: DC | PRN
  Filled 2016-07-19: qty 5

## 2016-07-19 MED ORDER — THROMBIN 20000 UNITS EX SOLR
CUTANEOUS | Status: DC | PRN
  Administered 2016-07-19: 13:00:00 via TOPICAL

## 2016-07-19 MED ORDER — SUGAMMADEX SODIUM 200 MG/2ML IV SOLN
INTRAVENOUS | Status: AC
  Filled 2016-07-19: qty 2

## 2016-07-19 MED ORDER — PHENYLEPHRINE 40 MCG/ML (10ML) SYRINGE FOR IV PUSH (FOR BLOOD PRESSURE SUPPORT)
PREFILLED_SYRINGE | INTRAVENOUS | Status: AC
  Filled 2016-07-19: qty 10

## 2016-07-19 MED ORDER — PHENOL 1.4 % MT LIQD
1.0000 | OROMUCOSAL | Status: DC | PRN
  Administered 2016-07-19: 1 via OROMUCOSAL
  Filled 2016-07-19: qty 177

## 2016-07-19 MED ORDER — FENTANYL CITRATE (PF) 100 MCG/2ML IJ SOLN
INTRAMUSCULAR | Status: AC
Start: 2016-07-19 — End: 2016-07-19
  Filled 2016-07-19: qty 4

## 2016-07-19 MED ORDER — CYCLOSPORINE MODIFIED (NEORAL) 100 MG PO CAPS
100.0000 mg | ORAL_CAPSULE | Freq: Every day | ORAL | Status: DC
  Filled 2016-07-19: qty 1

## 2016-07-19 MED ORDER — GLYCOPYRROLATE 0.2 MG/ML IJ SOLN
INTRAMUSCULAR | Status: DC | PRN
  Administered 2016-07-19: 0.4 mg via INTRAVENOUS

## 2016-07-19 MED ORDER — ALUM & MAG HYDROXIDE-SIMETH 200-200-20 MG/5ML PO SUSP: 30.0000 mL | Freq: Four times a day (QID) | ORAL | Status: DC | PRN

## 2016-07-19 MED ORDER — MIDAZOLAM HCL 2 MG/2ML IJ SOLN
INTRAMUSCULAR | Status: AC
  Filled 2016-07-19: qty 2

## 2016-07-19 MED ORDER — FAMOTIDINE IN NACL 20-0.9 MG/50ML-% IV SOLN: 20.0000 mg | Freq: Two times a day (BID) | INTRAVENOUS | Status: DC

## 2016-07-19 MED ORDER — FLEET ENEMA 7-19 GM/118ML RE ENEM: 1.0000 | ENEMA | Freq: Once | RECTAL | Status: DC | PRN

## 2016-07-19 SURGICAL SUPPLY — 69 items
BIT DRILL 14X2.5XNS TI ANT (BIT) ×1 IMPLANT
BIT DRILL AVIATOR 14 (BIT) ×1
BIT DRILL AVIATOR 14MM (BIT) ×1
BIT DRILL NEURO 2X3.1 SFT TUCH (MISCELLANEOUS) ×1 IMPLANT
BIT DRL 14X2.5XNS TI ANT (BIT) ×1
BUR BARREL STRAIGHT FLUTE 4.0 (BURR) ×6 IMPLANT
CANISTER SUCT 3000ML PPV (MISCELLANEOUS) ×3 IMPLANT
COVER MAYO STAND STRL (DRAPES) ×3 IMPLANT
DERMABOND ADVANCED (GAUZE/BANDAGES/DRESSINGS) ×2
DERMABOND ADVANCED .7 DNX12 (GAUZE/BANDAGES/DRESSINGS) ×1 IMPLANT
DRAIN JACKSON PRT FLT 10 (DRAIN) ×3 IMPLANT
DRAPE HALF SHEET 40X57 (DRAPES) ×6 IMPLANT
DRAPE LAPAROTOMY 100X72 PEDS (DRAPES) ×3 IMPLANT
DRAPE MICROSCOPE LEICA (MISCELLANEOUS) ×3 IMPLANT
DRAPE POUCH INSTRU U-SHP 10X18 (DRAPES) ×3 IMPLANT
DRILL NEURO 2X3.1 SOFT TOUCH (MISCELLANEOUS) ×3
DRSG OPSITE POSTOP 3X4 (GAUZE/BANDAGES/DRESSINGS) ×3 IMPLANT
DURAPREP 6ML APPLICATOR 50/CS (WOUND CARE) ×3 IMPLANT
ELECT COATED BLADE 2.86 ST (ELECTRODE) ×3 IMPLANT
ELECT REM PT RETURN 9FT ADLT (ELECTROSURGICAL) ×3
ELECTRODE REM PT RTRN 9FT ADLT (ELECTROSURGICAL) ×1 IMPLANT
EVACUATOR SILICONE 100CC (DRAIN) ×3 IMPLANT
GAUZE SPONGE 4X4 12PLY STRL (GAUZE/BANDAGES/DRESSINGS) IMPLANT
GLOVE BIO SURGEON STRL SZ8 (GLOVE) ×6 IMPLANT
GLOVE BIOGEL PI IND STRL 7.0 (GLOVE) ×2 IMPLANT
GLOVE BIOGEL PI IND STRL 7.5 (GLOVE) ×3 IMPLANT
GLOVE BIOGEL PI IND STRL 8 (GLOVE) ×4 IMPLANT
GLOVE BIOGEL PI IND STRL 8.5 (GLOVE) ×2 IMPLANT
GLOVE BIOGEL PI INDICATOR 7.0 (GLOVE) ×4
GLOVE BIOGEL PI INDICATOR 7.5 (GLOVE) ×6
GLOVE BIOGEL PI INDICATOR 8 (GLOVE) ×8
GLOVE BIOGEL PI INDICATOR 8.5 (GLOVE) ×4
GLOVE ECLIPSE 7.5 STRL STRAW (GLOVE) ×6 IMPLANT
GLOVE ECLIPSE 8.0 STRL XLNG CF (GLOVE) ×9 IMPLANT
GLOVE SS N UNI LF 6.5 STRL (GLOVE) ×9 IMPLANT
GOWN STRL REUS W/ TWL LRG LVL3 (GOWN DISPOSABLE) ×1 IMPLANT
GOWN STRL REUS W/ TWL XL LVL3 (GOWN DISPOSABLE) ×2 IMPLANT
GOWN STRL REUS W/TWL 2XL LVL3 (GOWN DISPOSABLE) ×6 IMPLANT
GOWN STRL REUS W/TWL LRG LVL3 (GOWN DISPOSABLE) ×2
GOWN STRL REUS W/TWL XL LVL3 (GOWN DISPOSABLE) ×4
HALTER HD/CHIN CERV TRACTION D (MISCELLANEOUS) ×3 IMPLANT
HEMOSTAT POWDER KIT SURGIFOAM (HEMOSTASIS) ×3 IMPLANT
KIT BASIN OR (CUSTOM PROCEDURE TRAY) ×3 IMPLANT
KIT ROOM TURNOVER OR (KITS) ×3 IMPLANT
NEEDLE HYPO 25X1 1.5 SAFETY (NEEDLE) ×3 IMPLANT
NEEDLE SPNL 22GX3.5 QUINCKE BK (NEEDLE) ×3 IMPLANT
NS IRRIG 1000ML POUR BTL (IV SOLUTION) ×3 IMPLANT
PACK LAMINECTOMY NEURO (CUSTOM PROCEDURE TRAY) ×3 IMPLANT
PAD ARMBOARD 7.5X6 YLW CONV (MISCELLANEOUS) ×9 IMPLANT
PEEK SPACER AVS AS 6X14X16 4D (Cage) ×6 IMPLANT
PIN DISTRACTION 14MM (PIN) ×6 IMPLANT
PLATE AVIATOR ASSY 1LVL SZ 16 (Plate) ×3 IMPLANT
PLATE AVIATOR ASSY 2LVL SZ 30 (Plate) ×3 IMPLANT
PUTTY DBX 1CC (Putty) ×6 IMPLANT
PUTTY DBX 1CC DEPUY (Putty) ×2 IMPLANT
RUBBERBAND STERILE (MISCELLANEOUS) ×6 IMPLANT
SCREW AVIAT VAR SLFTAP 4.35X14 (Screw) ×6 IMPLANT
SCREW AVIATOR VAR SELFTAP 4X14 (Screw) ×9 IMPLANT
SPACER AVS AS 12X14X5X0 (Orthopedic Implant) ×3 IMPLANT
SPACER CERV AVS 14X16X5MM 4D (Spacer) IMPLANT
SPONGE INTESTINAL PEANUT (DISPOSABLE) ×3 IMPLANT
SPONGE SURGIFOAM ABS GEL 100 (HEMOSTASIS) ×3 IMPLANT
STAPLER SKIN PROX WIDE 3.9 (STAPLE) ×3 IMPLANT
SUT VIC AB 3-0 SH 8-18 (SUTURE) ×6 IMPLANT
TOWEL OR 17X24 6PK STRL BLUE (TOWEL DISPOSABLE) ×3 IMPLANT
TOWEL OR 17X26 10 PK STRL BLUE (TOWEL DISPOSABLE) ×3 IMPLANT
TRAP SPECIMEN MUCOUS 40CC (MISCELLANEOUS) ×3 IMPLANT
TRAY FOLEY W/METER SILVER 16FR (SET/KITS/TRAYS/PACK) ×3 IMPLANT
WATER STERILE IRR 1000ML POUR (IV SOLUTION) ×3 IMPLANT

## 2016-07-19 NOTE — Progress Notes (Signed)
Awake, alert, conversant.  MAEW with full strength, bilateral D/B/T/HI.  Doing well.

## 2016-07-19 NOTE — H&P (Signed)
  Patient ID:   [id number] Patient: Brian Leon  Date of Birth: Dec 11, 1945 Visit Type: Office Visit   Date: 07/11/2016 10:45 AM Provider: Marchia Meiers. Vertell Limber MD   This 70 year old male presents for neck pain.  History of Present Illness: 1.  neck pain    Patient returns to review his MRI. He continues to have neck and shoulder pain with numbness in his hands.   Physical Exam: Bilateral hand intrinsics weakness, deltoid strength is normal bilaterally  06/01/16 MRI: Interval C6-7 ACDF with improved thecal sac patency. Stable to improved C7 foraminal patency. Stable postoperative appearance of C5-6 other than C5 level hardware removal. Other cervical levels are stable since 2016, including multifactorial spinal stenosis with up to severe foraminal stenosis at C3-4 and C4-5, and up to severe foraminal stenosis at C7-T1. No spinal cord signal abnormality.      Medical/Surgical/Interim History Reviewed, no change.  Last detailed document date:05/25/2016.   PAST MEDICAL HISTORY, SURGICAL HISTORY, FAMILY HISTORY, SOCIAL HISTORY AND REVIEW OF SYSTEMS  05/25/2016, which I have signed.  Family History: Reviewed, no changes.  Last detailed document: 05/25/2016.   Social History: Tobacco use reviewed. Reviewed, no changes. Last detailed document date: 05/25/2016.      MEDICATIONS(added, continued or stopped this visit): Started Medication Directions Instruction Stopped   Advair Diskus 500 mcg-50 mcg/dose powder for inhalation inhale 1 puff by inhalation route 2 times every day in the morning and evening approximately 12 hours apart     cyclosporine 100 mg capsule Take as directed     gabapentin 300 mg capsule take 1 capsule by oral route 2 times every day     hydrochlorothiazide 12.5 mg tablet Take as directed       ALLERGIES: Ingredient Reaction Medication Name Comment  SULFA (SULFONAMIDE ANTIBIOTICS) Unknown    ACETAMINOPHEN Nausea Tylenol       Vitals Date Temp F BP Pulse Ht  In Wt Lb BMI BSA Pain Score  07/11/2016  131/81 66 69    3/10      IMPRESSION The patient's MRI reveals spinal stenosis and severe foraminal stenosis at C3-4, C4-5 and severe foraminal stenosis at C7-T1. He also has degeneration and disc height loss at those levels. These findings are causing multilevel nerve compression. On confrontational testing, he has bilateral hand intrinsics weakness due to compression of the C7 nerve, but his bilateral deltoid strength is normal. Since he is in miserable pain and is weak in his hands, I recommend a C3-4, C4-5, and C7-T1 ACDF for alleviation of his neck and bilateral arm symptoms.   Schedule C3-4, C4-5, and C7-T1 ACDF with exploration of fusion C 67 level. Nurse education given. Fitted for soft cervical collar.              Provider:  Marchia Meiers. Vertell Limber MD  07/11/2016 11:23 AM Dictation edited by: Johnella Moloney    CC Providers: [facility] [address] [place], [place] [id number]              Electronically signed by Marchia Meiers. Vertell Limber MD on 07/11/2016 11:54 AM

## 2016-07-19 NOTE — Anesthesia Postprocedure Evaluation (Signed)
Anesthesia Post Note  Patient: Brian Leon  Procedure(s) Performed: Procedure(s) (LRB): Cervical Three-Four, Cervical Four-Five, Cervical Seven-Thoracic One Anterior cervical decompression/diskectomy/fusion with  removal of Cervical Six-Cervical Seven Plate (Right)  Patient location during evaluation: PACU Anesthesia Type: General Level of consciousness: awake and alert Pain management: pain level controlled Vital Signs Assessment: post-procedure vital signs reviewed and stable Respiratory status: spontaneous breathing, nonlabored ventilation and respiratory function stable Cardiovascular status: blood pressure returned to baseline and stable Postop Assessment: no signs of nausea or vomiting Anesthetic complications: no    Last Vitals:  Vitals:   07/19/16 1615 07/19/16 1630  BP: 132/77 122/82  Pulse: 84 82  Resp: 13 19  Temp:      Last Pain:  Vitals:   07/19/16 0921  TempSrc:   PainSc: 4                  [name],W. EDMOND

## 2016-07-19 NOTE — Interval H&P Note (Signed)
History and Physical Interval Note:  07/19/2016 11:16 AM  Brian Leon  has presented today for surgery, with the diagnosis of Stenosis of cervical spine with myelopathy  The various methods of treatment have been discussed with the patient and family. After consideration of risks, benefits and other options for treatment, the patient has consented to  Procedure(s) with comments: Right sided C3-4 C4-5 C7-T1 Anterior cervical decompression/diskectomy/fusion with exploration and possible removal of nuvasive plate C6-7 (Right) - Right sided C3-4 C4-5 C7-T1 Anterior cervical decompression/diskectomy/fusion with exploration and possible removal of nuvasive plate C6-7 as a surgical intervention .  The patient's history has been reviewed, patient examined, no change in status, stable for surgery.  I have reviewed the patient's chart and labs.  Questions were answered to the patient's satisfaction.     [name] D

## 2016-07-19 NOTE — Anesthesia Preprocedure Evaluation (Addendum)
Anesthesia Evaluation  Patient identified by MRN, date of birth, ID band Patient awake    Reviewed: Allergy & Precautions, H&P , NPO status , Patient's Chart, lab work & pertinent test results  Airway Mallampati: II  TM Distance: >3 FB Neck ROM: Full    Dental no notable dental hx. (+) Teeth Intact, Dental Advisory Given   Pulmonary asthma , former smoker,    Pulmonary exam normal breath sounds clear to auscultation       Cardiovascular hypertension, Pt. on medications  Rhythm:Regular Rate:Normal     Neuro/Psych  Headaches, negative psych ROS   GI/Hepatic negative GI ROS, Neg liver ROS,   Endo/Other  negative endocrine ROS  Renal/GU Renal disease  negative genitourinary   Musculoskeletal  (+) Arthritis , Osteoarthritis,    Abdominal   Peds  Hematology negative hematology ROS (+) anemia ,   Anesthesia Other Findings   Reproductive/Obstetrics negative OB ROS                            Anesthesia Physical Anesthesia Plan  ASA: II  Anesthesia Plan: General   Post-op Pain Management:    Induction: Intravenous  Airway Management Planned: Oral ETT  Additional Equipment:   Intra-op Plan:   Post-operative Plan: Extubation in OR  Informed Consent: I have reviewed the patients History and Physical, chart, labs and discussed the procedure including the risks, benefits and alternatives for the proposed anesthesia with the patient or authorized representative who has indicated his/her understanding and acceptance.   Dental advisory given  Plan Discussed with: CRNA  Anesthesia Plan Comments:         Anesthesia Quick Evaluation

## 2016-07-19 NOTE — Brief Op Note (Signed)
07/19/2016  3:43 PM  PATIENT:  Brian Leon  69 y.o. male  PRE-OPERATIVE DIAGNOSIS:  Stenosis of cervical spine with myelopathy, herniated cervical disc, cervicalgia  C 34, C 45, C 7 T 1 levels  POST-OPERATIVE DIAGNOSIS: Stenosis of cervical spine with myelopathy, herniated cervical disc, cervicalgia  C 34, C 45, C 7 T 1 levels   PROCEDURE:  Procedure(s) with comments: Cervical Three-Four, Cervical Four-Five, Cervical Seven-Thoracic One Anterior cervical decompression/diskectomy/fusion with  removal of Cervical Six-Cervical Seven Plate (Right) - Right sided C3-4 C4-5 C7-T1 Anterior cervical decompression/diskectomy/fusion with exploration and possible removal of nuvasive plate C6-7 with exploration of C 67 fusion  SURGEON:  Surgeon(s) and Role:    * Erline Levine, MD - Primary  PHYSICIAN ASSISTANT: Ditty, MD  ASSISTANTS: Poteat, RN   ANESTHESIA:   general  EBL:  Total I/O In: 1600 [I.V.:1600] Out: 100 [Blood:100]  BLOOD ADMINISTERED:none  DRAINS: (10) Jackson-Pratt drain(s) with closed bulb suction in the prevertebral space   LOCAL MEDICATIONS USED:  MARCAINE    and LIDOCAINE   SPECIMEN:  No Specimen  DISPOSITION OF SPECIMEN:  N/A  COUNTS:  YES  TOURNIQUET:  * No tourniquets in log *  DICTATION: Patient was brought to operating room and following the smooth and uncomplicated induction of general endotracheal anesthesia his head was placed on a horseshoe head holder he was placed in 5 pounds of Holter traction and his anterior neck was prepped and draped in usual sterile fashion. An incision was made on the right side of midline after infiltrating the skin and subcutaneous tissues with local lidocaine. The platysmal layer was incised and subplatysmal dissection was performed exposing the anterior border sternocleidomastoid muscle. Using blunt dissection the carotid sheath was kept lateral and trachea and esophagus kept medial exposing the anterior cervical spine. The  previously placed plate was identified and removed.  The previous site of fusion was inspected and there appeared to be solid arthrodesis without suggestion of pseudoarthrosis. Longus colli muscles were taken down and self-retaining Shadowline retractor was placed.  The interspace at C 7 T 1 was incised and a thorough discectomy was performed. Distraction pins were placed. Uncinate spurs and central spondylitic ridges were drilled down with a high-speed drill. The spinal cord dura and both C 8 nerve roots were widely decompressed. Hemostasis was assured. After trial sizing a 6 mm peek interbody cage was selected and packed with local autograft and DBM. The graft was tamped into position and countersunk appropriately. A 16 mm Aviator plate was affixed to the cervical spine at C 7 - T 1 levels with 14 mm screws.  Exposure was then carried cephalad to the C 34 and C 45 levels.  A bent spinal needle was placed it was felt to be the C 34 level and this was found to be C 23.  Subsequent X ray showed a needle at C 34 and C 45 levels and this was confirmed on intraoperative x-ray. Longus coli muscles were taken down from the anterior cervical spine using electrocautery and key elevator and self-retaining retractor was placed. The interspace at C 34 was incised and a thorough discectomy was performed. Distraction pins were placed. Uncinate spurs and central spondylitic ridges were drilled down with a high-speed drill. The spinal cord dura and both C4 nerve roots were widely decompressed. Hemostasis was assured. After trial sizing a 5 mm peek interbody cage was selected and packed with autograft and DBM. The graft was tamped into position and countersunk appropriately. The  retractor was moved and the interspace at C 45 was incised and a thorough discectomy was performed. Distraction pins were placed. Uncinate spurs and central spondylitic ridges were drilled down with a high-speed drill. The spinal cord dura and both C 5  nerve roots were widely decompressed. Hemostasis was assured. After trial sizing a 6 mm peek interbody cage was selected and packed in a similar fashion. The graft was tamped into position and countersunk appropriately. A 30 mm [employer]ryker Aviator anterior cervical plate was affixed to the cervical spine with 14 mm variable-angle screws 2 at C3, 2 at C4, 2 at C5. All screws were well-positioned and locking mechanisms were engaged. Soft tissues were inspected and found to be in good repair. The wound was irrigated. A final x-ray was obtained with good visualization to C 7 with the interbody grafts well visualized. The platysma layer was closed with 3-0 Vicryl stitches and the skin was reapproximated with 3-0 Vicryl subcuticular stitches. The wound was dressed with Dermabond and an occlusive dressing. Counts were correct at the end of the case. Patient was extubated and taken to recovery in stable and satisfactory condition.    PLAN OF CARE: Admit to inpatient   PATIENT DISPOSITION:  PACU - hemodynamically stable.   Delay start of Pharmacological VTE agent (>24hrs) due to surgical blood loss or risk of bleeding: yes

## 2016-07-19 NOTE — Anesthesia Procedure Notes (Signed)
Procedure Name: Intubation Date/Time: 07/19/2016 11:58 AM Performed by: Roderic Palau Pre-anesthesia Checklist: Patient identified Patient Re-evaluated:Patient Re-evaluated prior to inductionOxygen Delivery Method: Circle system utilized and Simple face mask Preoxygenation: Pre-oxygenation with 100% oxygen Intubation Type: IV induction Ventilation: Mask ventilation without difficulty Laryngoscope Size: Miller and 3 Grade View: Grade I Tube type: Oral Tube size: 7.5 mm Number of attempts: 1 Airway Equipment and Method: Stylet Placement Confirmation: ETT inserted through vocal cords under direct vision,  positive ETCO2 and breath sounds checked- equal and bilateral Secured at: 22 cm Tube secured with: Tape Dental Injury: Teeth and Oropharynx as per pre-operative assessment

## 2016-07-19 NOTE — Transfer of Care (Signed)
Immediate Anesthesia Transfer of Care Note  Patient: Brian Leon  Procedure(s) Performed: Procedure(s) with comments: Cervical Three-Four, Cervical Four-Five, Cervical Seven-Thoracic One Anterior cervical decompression/diskectomy/fusion with  removal of Cervical Six-Cervical Seven Plate (Right) - Right sided C3-4 C4-5 C7-T1 Anterior cervical decompression/diskectomy/fusion with exploration and possible removal of nuvasive plate C6-7  Patient Location: PACU  Anesthesia Type:General  Level of Consciousness: awake, alert  and oriented  Airway & Oxygen Therapy: Patient Spontanous Breathing and Patient connected to nasal cannula oxygen  Post-op Assessment: Report given to RN, Post -op Vital signs reviewed and stable and Patient moving all extremities X 4  Post vital signs: Reviewed and stable  Last Vitals:  Vitals:   07/19/16 0835 07/19/16 1549  BP: (!) 143/80   Pulse: 72   Resp: 18   Temp: 36.8 C (P) 36.4 C    Last Pain:  Vitals:   07/19/16 0921  TempSrc:   PainSc: 4       Patients Stated Pain Goal: 4 ([id number])  Complications: No apparent anesthesia complications

## 2016-07-19 NOTE — Op Note (Signed)
07/19/2016  3:43 PM  PATIENT:  Brian Leon  70 y.o. male  PRE-OPERATIVE DIAGNOSIS:  Stenosis of cervical spine with myelopathy, herniated cervical disc, cervicalgia  C 34, C 45, C 7 T 1 levels  POST-OPERATIVE DIAGNOSIS: Stenosis of cervical spine with myelopathy, herniated cervical disc, cervicalgia  C 34, C 45, C 7 T 1 levels   PROCEDURE:  Procedure(s) with comments: Cervical Three-Four, Cervical Four-Five, Cervical Seven-Thoracic One Anterior cervical decompression/diskectomy/fusion with  removal of Cervical Six-Cervical Seven Plate (Right) - Right sided C3-4 C4-5 C7-T1 Anterior cervical decompression/diskectomy/fusion with exploration and possible removal of nuvasive plate C6-7 with exploration of C 67 fusion  SURGEON:  Surgeon(s) and Role:    * Erline Levine, MD - Primary  PHYSICIAN ASSISTANT: Ditty, MD  ASSISTANTS: Poteat, RN   ANESTHESIA:   general  EBL:  Total I/O In: 1600 [I.V.:1600] Out: 100 [Blood:100]  BLOOD ADMINISTERED:none  DRAINS: (10) Jackson-Pratt drain(s) with closed bulb suction in the prevertebral space   LOCAL MEDICATIONS USED:  MARCAINE    and LIDOCAINE   SPECIMEN:  No Specimen  DISPOSITION OF SPECIMEN:  N/A  COUNTS:  YES  TOURNIQUET:  * No tourniquets in log *  DICTATION: Patient was brought to operating room and following the smooth and uncomplicated induction of general endotracheal anesthesia his head was placed on a horseshoe head holder he was placed in 5 pounds of Holter traction and his anterior neck was prepped and draped in usual sterile fashion. An incision was made on the right side of midline after infiltrating the skin and subcutaneous tissues with local lidocaine. The platysmal layer was incised and subplatysmal dissection was performed exposing the anterior border sternocleidomastoid muscle. Using blunt dissection the carotid sheath was kept lateral and trachea and esophagus kept medial exposing the anterior cervical spine. The  previously placed plate was identified and removed.  The previous site of fusion was inspected and there appeared to be solid arthrodesis without suggestion of pseudoarthrosis. Longus colli muscles were taken down and self-retaining Shadowline retractor was placed.  The interspace at C 7 T 1 was incised and a thorough discectomy was performed. Distraction pins were placed. Uncinate spurs and central spondylitic ridges were drilled down with a high-speed drill. The spinal cord dura and both C 8 nerve roots were widely decompressed. Hemostasis was assured. After trial sizing a 6 mm peek interbody cage was selected and packed with local autograft and DBM. The graft was tamped into position and countersunk appropriately. A 16 mm Aviator plate was affixed to the cervical spine at C 7 - T 1 levels with 14 mm screws.  Exposure was then carried cephalad to the C 34 and C 45 levels.  A bent spinal needle was placed it was felt to be the C 34 level and this was found to be C 23.  Subsequent X ray showed a needle at C 34 and C 45 levels and this was confirmed on intraoperative x-ray. Longus coli muscles were taken down from the anterior cervical spine using electrocautery and key elevator and self-retaining retractor was placed. The interspace at C 34 was incised and a thorough discectomy was performed. Distraction pins were placed. Uncinate spurs and central spondylitic ridges were drilled down with a high-speed drill. The spinal cord dura and both C4 nerve roots were widely decompressed. Hemostasis was assured. After trial sizing a 5 mm peek interbody cage was selected and packed with autograft and DBM. The graft was tamped into position and countersunk appropriately. The  retractor was moved and the interspace at C 45 was incised and a thorough discectomy was performed. Distraction pins were placed. Uncinate spurs and central spondylitic ridges were drilled down with a high-speed drill. The spinal cord dura and both C 5  nerve roots were widely decompressed. Hemostasis was assured. After trial sizing a 6 mm peek interbody cage was selected and packed in a similar fashion. The graft was tamped into position and countersunk appropriately. A 30 mm Stryker Aviator anterior cervical plate was affixed to the cervical spine with 14 mm variable-angle screws 2 at C3, 2 at C4, 2 at C5. All screws were well-positioned and locking mechanisms were engaged. Soft tissues were inspected and found to be in good repair. The wound was irrigated. A final x-ray was obtained with good visualization to C 7 with the interbody grafts well visualized. The platysma layer was closed with 3-0 Vicryl stitches and the skin was reapproximated with 3-0 Vicryl subcuticular stitches. The wound was dressed with Dermabond and an occlusive dressing. Counts were correct at the end of the case. Patient was extubated and taken to recovery in stable and satisfactory condition.    PLAN OF CARE: Admit to inpatient   PATIENT DISPOSITION:  PACU - hemodynamically stable.   Delay start of Pharmacological VTE agent (>24hrs) due to surgical blood loss or risk of bleeding: yes

## 2016-07-20 MED ORDER — METHOCARBAMOL 500 MG PO TABS: 500.0000 mg | ORAL_TABLET | Freq: Four times a day (QID) | ORAL | 1 refills | Status: DC | PRN

## 2016-07-20 MED ORDER — ONDANSETRON HCL 4 MG PO TABS
4.0000 mg | ORAL_TABLET | ORAL | Status: DC | PRN
  Administered 2016-07-20: 4 mg via ORAL
  Filled 2016-07-20: qty 1

## 2016-07-20 MED ORDER — OXYCODONE HCL 5 MG PO TABS: 5.0000 mg | ORAL_TABLET | ORAL | 0 refills | Status: DC | PRN

## 2016-07-20 NOTE — Discharge Summary (Signed)
Physician Discharge Summary  Patient ID: KATRON SCHERZER MRN: [id number] DOB/AGE: [birth year] 70 y.o.  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  He was doing well postoperatively and was discharged home on POD 1.  Consults: None  Significant Diagnostic Studies: None  Treatments: surgery: Patient underwent exploration of fusion C 67 with ACDF C 34, C 45, C 7 T 1 levels  Discharge Exam: Blood pressure 128/67, pulse 68, temperature 98.5 F (36.9 C), resp. rate 18, height 5\' 9"  (1.753 m), weight 67.8 kg (149 lb 8 oz), SpO2 99 %. Neurologic: Alert and oriented X 3, normal strength and tone. Normal symmetric reflexes. Normal coordination and gait Wound:CDI  Disposition: Home     Medication List    TAKE these medications   albuterol 108 (90 Base) MCG/ACT inhaler Commonly known as:  PROVENTIL HFA;VENTOLIN HFA Inhale 1-2 puffs into the lungs every 6 (six) hours as needed for wheezing or shortness of breath.   cycloSPORINE modified 100 MG capsule Commonly known as:  NEORAL Take 100 mg by mouth daily.   finasteride 5 MG tablet Commonly known as:  PROSCAR Take 5 mg by mouth daily.   fluticasone 50 MCG/ACT nasal spray Commonly known as:  FLONASE Place 2 sprays into both nostrils daily. What changed:  how much to take   Fluticasone-Salmeterol 500-50 MCG/DOSE Aepb Commonly known as:  ADVAIR DISKUS INHALE 1 PUFF INTO THE LUNGS TWICE A DAY   hydrochlorothiazide 12.5 MG capsule Commonly known as:  MICROZIDE Take 1 capsule (12.5 mg total) by mouth daily. TAKE 1 CAPSULE BY MOUTH  2 TIMES DAILY.   methocarbamol 500 MG tablet Commonly known as:  ROBAXIN Take 1 tablet (500 mg total) by mouth every 6 (six) hours as needed for muscle spasms.   oxyCODONE 5 MG immediate release tablet Commonly known as:  Oxy IR/ROXICODONE Take 1-2 tablets (5-10 mg total) by mouth every 4 (four) hours as needed for moderate pain or severe pain.   polyethylene glycol packet Commonly known as:  MIRALAX Take 17 g by mouth daily. What changed:  when to take this  reasons to take this        Signed: Peggyann Shoals, MD 07/20/2016, 8:38 AM

## 2016-07-20 NOTE — Progress Notes (Signed)
Patient alert and oriented, mae's well, voiding adequate amount of urine, swallowing without difficulty, no c/o pain. Patient discharged home with family. Script and discharged instructions given to patient. Patient and family stated understanding of d/c instructions given and has an appointment with MD. 

## 2016-07-20 NOTE — Evaluation (Signed)
Occupational Therapy Evaluation Patient Details Name: Brian Leon MRN: [id number] DOB: 12/24/1945 Today's Date: 07/20/2016    History of Present Illness Patient is a 70 yo male s/p Cervical Three-Four, Cervical Four-Five, Cervical Seven-Thoracic One Anterior cervical decompression/diskectomy/fusion with  removal of Cervical Six-Cervical Seven Plate (Right).   Clinical Impression   Patient evaluated by Occupational Therapy with no further acute OT needs identified. All education has been completed and the patient has no further questions. See below for any follow-up Occupational Therapy or equipment needs. OT to sign off. Thank you for referral.      Follow Up Recommendations  No OT follow up    Equipment Recommendations  None recommended by OT    Recommendations for Other Services       Precautions / Restrictions Precautions Precautions: Cervical Required Braces or Orthoses: Cervical Brace Cervical Brace: Hard collar;At all times Restrictions Weight Bearing Restrictions: No      Mobility Bed Mobility Overal bed mobility: Independent             General bed mobility comments: recevied in chair  Transfers Overall transfer level: Modified independent Equipment used: None             General transfer comment: increased time to coem to standing, no assist or cues needed    Balance Overall balance assessment: No apparent balance deficits (not formally assessed)                                          ADL Overall ADL's : Modified independent                                              Vision     Perception     Praxis      Pertinent Vitals/Pain Pain Assessment: 0-10 Pain Score: 5  Pain Location: neck Pain Descriptors / Indicators: Operative site guarding Pain Intervention(s): Limited activity within patient's tolerance;Premedicated before session;Repositioned;Monitored during session     Hand  Dominance Right   Extremity/Trunk Assessment Upper Extremity Assessment Upper Extremity Assessment: Generalized weakness   Lower Extremity Assessment Lower Extremity Assessment: Defer to PT evaluation   Cervical / Trunk Assessment Cervical / Trunk Assessment: Other exceptions (s/p surg)   Communication Communication Communication: No difficulties   Cognition Arousal/Alertness: Awake/alert Behavior During Therapy: WFL for tasks assessed/performed Overall Cognitive Status: Within Functional Limits for tasks assessed                     General Comments       Exercises       Shoulder Instructions      Home Living Family/patient expects to be discharged to:: Private residence Living Arrangements: Spouse/significant other Available Help at Discharge: Family;Available 24 hours/day Type of Home: House Home Access: Stairs to enter [employer] of Steps: 11 Entrance Stairs-Rails: Left;Right Home Layout: Two level;Bed/bath upstairs     Bathroom Shower/Tub: Teacher, early years/pre: Standard     Home Equipment: None          Prior Functioning/Environment Level of Independence: Independent                 OT Problem List:     OT Treatment/Interventions:  OT Goals(Current goals can be found in the care plan section)    OT Frequency:     Barriers to D/C:            Co-evaluation              End of Session Equipment Utilized During Treatment: Cervical collar Nurse Communication: Mobility status;Precautions  Activity Tolerance: Patient tolerated treatment well Patient left: in chair;with call bell/phone within reach   Time: 0739-0758 OT Time Calculation (min): 19 min Charges:  OT General Charges $OT Visit: 1 Procedure OT Evaluation $OT Eval Moderate Complexity: 1 Procedure G-Codes:    Parke Poisson B 16-Aug-2016, 8:35 AM   Jeri Modena   OTR/L [phone] Office: [phone] .

## 2016-07-20 NOTE — Evaluation (Signed)
Physical Therapy Evaluation Patient Details Name: Brian Leon MRN: [id number] DOB: 09/14/1946 Today's Date: 07/20/2016   History of Present Illness  Patient is a 70 yo male s/p Cervical Three-Four, Cervical Four-Five, Cervical Seven-Thoracic One Anterior cervical decompression/diskectomy/fusion with  removal of Cervical Six-Cervical Seven Plate (Right).  Clinical Impression  Patient seen for mobility assessment and education s/p spinal surgery. Pt receptive to education, mobilizing well, performed stair negotiaton without difficulty. No further acute PT needs, will sign off.    Follow Up Recommendations No PT follow up;Supervision - Intermittent    Equipment Recommendations  None recommended by PT    Recommendations for Other Services       Precautions / Restrictions Precautions Precautions: Cervical Required Braces or Orthoses: Cervical Brace Cervical Brace: Hard collar;At all times Restrictions Weight Bearing Restrictions: No      Mobility  Bed Mobility               General bed mobility comments: recevied in chair  Transfers Overall transfer level: Modified independent Equipment used: None             General transfer comment: increased time to coem to standing, no assist or cues needed  Ambulation/Gait Ambulation/Gait assistance: Independent Ambulation Distance (Feet): 180 Feet Assistive device: None Gait Pattern/deviations: WFL(Within Functional Limits)   Gait velocity interpretation: at or above normal speed for age/gender General Gait Details: steady with ambulation  Stairs Stairs: Yes Stairs assistance: Supervision Stair Management: One rail Left;Step to pattern Number of Stairs: 12 General stair comments: VCs for sequencing and technique for blind negotation  Wheelchair Mobility    Modified Rankin (Stroke Patients Only)       Balance Overall balance assessment: No apparent balance deficits (not formally assessed)                                            Pertinent Vitals/Pain Pain Assessment: 0-10 Pain Score: 5  Pain Location: neck Pain Descriptors / Indicators: Operative site guarding Pain Intervention(s): Limited activity within patient's tolerance;Premedicated before session;Repositioned;Monitored during session    Home Living Family/patient expects to be discharged to:: Private residence Living Arrangements: Spouse/significant other Available Help at Discharge: Family;Available 24 hours/day Type of Home: House Home Access: Stairs to enter Entrance Stairs-Rails: Chemical engineer of Steps: 11 Home Layout: Two level;Bed/bath upstairs Home Equipment: None      Prior Function Level of Independence: Independent               Hand Dominance   Dominant Hand: Right    Extremity/Trunk Assessment               Lower Extremity Assessment: Overall WFL for tasks assessed      Cervical / Trunk Assessment:  (s/p cervical surgery)  Communication   Communication: No difficulties  Cognition Arousal/Alertness: Awake/alert Behavior During Therapy: WFL for tasks assessed/performed Overall Cognitive Status: Within Functional Limits for tasks assessed                      General Comments      Exercises     Assessment/Plan    PT Assessment Patent does not need any further PT services  PT Problem List            PT Treatment Interventions      PT Goals (Current goals can be found in the Care  Plan section)  Acute Rehab PT Goals PT Goal Formulation: All assessment and education complete, DC therapy    Frequency     Barriers to discharge        Co-evaluation               End of Session Equipment Utilized During Treatment: Cervical collar Activity Tolerance: Patient tolerated treatment well Patient left: in bed (sitting EOB) Nurse Communication: Mobility status    Functional Assessment Tool Used: clinical judgement Functional  Limitation: Mobility: Walking and moving around Mobility: Walking and Moving Around Current Status [id number]): At least 1 percent but less than 20 percent impaired, limited or restricted Mobility: Walking and Moving Around Goal Status [phone]): At least 1 percent but less than 20 percent impaired, limited or restricted Mobility: Walking and Moving Around Discharge Status [phone]): At least 1 percent but less than 20 percent impaired, limited or restricted    Time: 0758-0812 PT Time Calculation (min) (ACUTE ONLY): 14 min   Charges:   PT Evaluation $PT Eval Low Complexity: 1 Procedure     PT G Codes:   PT G-Codes **NOT FOR INPATIENT CLASS** Functional Assessment Tool Used: clinical judgement Functional Limitation: Mobility: Walking and moving around Mobility: Walking and Moving Around Current Status [id number]): At least 1 percent but less than 20 percent impaired, limited or restricted Mobility: Walking and Moving Around Goal Status [phone]): At least 1 percent but less than 20 percent impaired, limited or restricted Mobility: Walking and Moving Around Discharge Status [phone]): At least 1 percent but less than 20 percent impaired, limited or restricted    Duncan Dull 07/20/2016, 8:29 AM Alben Deeds, PT DPT  [phone]

## 2016-07-20 NOTE — Progress Notes (Signed)
Subjective: Patient reports doing well  Objective: Vital signs in last 24 hours: Temp:  [97.6 F (36.4 C)-98.5 F (36.9 C)] 98.5 F (36.9 C) (10/18 0743) Pulse Rate:  [58-97] 68 (10/18 0743) Resp:  [13-18] 18 (10/18 0743) BP: (122-155)/(67-96) 128/67 (10/18 0743) SpO2:  [92 %-99 %] 99 % (10/18 0743)  Intake/Output from previous day: 10/17 0701 - 10/18 0700 In: 1600 [I.V.:1600] Out: 160 [Drains:60; Blood:100] Intake/Output this shift: No intake/output data recorded.  Physical Exam: Strength full bilateral upper extremities  Lab Results: No results for input(s): WBC, HGB, HCT, PLT in the last 72 hours. BMET No results for input(s): NA, K, CL, CO2, GLUCOSE, BUN, CREATININE, CALCIUM in the last 72 hours.  Studies/Results: Dg Cervical Spine Complete  Result Date: 07/19/2016 CLINICAL DATA:  ACDF C3 through C5. EXAM: CERVICAL SPINE - COMPLETE 4+ VIEW COMPARISON:  Cervical MRI 06/01/2016 FINDINGS: Based on the prior MRI, there is interbody bone graft at C5-6. Anterior plate and interbody bone graft at C7-T1 Image number 1 reveals a needle overlying the C2-3 disc space from an anterior approach. Image number 2 reveals needles overlying the C3-4 and C4-5 disc spaces from anterior approach Image number 3 demonstrates interbody bone graft at C3-4 and C4-5. Anterior plate C3 through C5 without screws placed Image number 4 demonstrates ACDF with anterior plate and screws and interbody bone plug in good position at C3-4 and C4-5. No change in the fusion at C6-7 and C7-T1 IMPRESSION: ACDF C3 through C5 in the operating room. Electronically Signed   By: Franchot Gallo M.D.   On: 07/19/2016 15:50    Assessment/Plan: Patient doing well.  Discharge home.    LOS: 1 day    Peggyann Shoals, MD 07/20/2016, 8:36 AM

## 2016-07-20 NOTE — Discharge Instructions (Signed)

## 2016-07-21 ENCOUNTER — Encounter (HOSPITAL_COMMUNITY): Payer: Self-pay | Admitting: Neurosurgery

## 2016-07-29 DIAGNOSIS — S62316D Displaced fracture of base of fifth metacarpal bone, right hand, subsequent encounter for fracture with routine healing: Secondary | ICD-10-CM | POA: Diagnosis not present

## 2016-08-10 DIAGNOSIS — M5023 Other cervical disc displacement, cervicothoracic region: Secondary | ICD-10-CM | POA: Diagnosis not present

## 2016-08-10 DIAGNOSIS — M5412 Radiculopathy, cervical region: Secondary | ICD-10-CM | POA: Diagnosis not present

## 2016-08-10 DIAGNOSIS — M542 Cervicalgia: Secondary | ICD-10-CM | POA: Diagnosis not present

## 2016-08-10 DIAGNOSIS — M5022 Other cervical disc displacement, mid-cervical region, unspecified level: Secondary | ICD-10-CM | POA: Diagnosis not present

## 2016-08-10 DIAGNOSIS — Z681 Body mass index (BMI) 19 or less, adult: Secondary | ICD-10-CM | POA: Diagnosis not present

## 2016-09-13 ENCOUNTER — Other Ambulatory Visit: Payer: Self-pay | Admitting: *Deleted

## 2016-09-13 MED ORDER — FLUTICASONE-SALMETEROL 500-50 MCG/DOSE IN AEPB: INHALATION_SPRAY | RESPIRATORY_TRACT | 5 refills | Status: DC

## 2016-09-19 DIAGNOSIS — M542 Cervicalgia: Secondary | ICD-10-CM | POA: Diagnosis not present

## 2016-09-19 DIAGNOSIS — I10 Essential (primary) hypertension: Secondary | ICD-10-CM | POA: Diagnosis not present

## 2016-09-19 DIAGNOSIS — M4712 Other spondylosis with myelopathy, cervical region: Secondary | ICD-10-CM | POA: Diagnosis not present

## 2016-09-19 DIAGNOSIS — M5022 Other cervical disc displacement, mid-cervical region, unspecified level: Secondary | ICD-10-CM | POA: Diagnosis not present

## 2016-09-19 DIAGNOSIS — M19019 Primary osteoarthritis, unspecified shoulder: Secondary | ICD-10-CM | POA: Diagnosis not present

## 2016-10-06 DIAGNOSIS — M7581 Other shoulder lesions, right shoulder: Secondary | ICD-10-CM | POA: Diagnosis not present

## 2016-10-06 DIAGNOSIS — M7582 Other shoulder lesions, left shoulder: Secondary | ICD-10-CM | POA: Diagnosis not present

## 2016-10-26 ENCOUNTER — Telehealth: Payer: Self-pay | Admitting: Family Medicine

## 2016-10-26 ENCOUNTER — Other Ambulatory Visit: Payer: Self-pay | Admitting: Family Medicine

## 2016-10-26 DIAGNOSIS — Z20828 Contact with and (suspected) exposure to other viral communicable diseases: Secondary | ICD-10-CM

## 2016-10-26 MED ORDER — OSELTAMIVIR PHOSPHATE 75 MG PO CAPS: 75.0000 mg | ORAL_CAPSULE | Freq: Every day | ORAL | 0 refills | Status: DC

## 2016-10-26 NOTE — Telephone Encounter (Signed)
Spoke to patient.  Tamiflu 75mg  daily sent in.  See my separate note for detail.

## 2016-10-26 NOTE — Progress Notes (Signed)
Patient reports that he was exposed to flu in last 24 hours.  He reports he is on Cyclosporine and is concerned about his exposure.  He would like to initiate prx treatment.  Denies fevers, chills, myalgia, cough.  He has NOT had his flu shot this year.  We discussed possible side effects of medication.  He voices good understanding.  Will treat with Tamiflu per the CDC guidelines once daily x7 days.  Meds ordered this encounter  Medications  . oseltamivir (TAMIFLU) 75 MG capsule    Sig: Take 1 capsule (75 mg total) by mouth daily.    Dispense:  7 capsule    Refill:  0    Influenza prophylaxis   Pamela Intrieri M. Lajuana Ripple, DO PGY-3, [facility]

## 2016-10-26 NOTE — Telephone Encounter (Signed)
Pt was exposed to the flu yesterday  and hasnt had a flu shot. What should he do? Please advise

## 2016-11-01 DIAGNOSIS — Z23 Encounter for immunization: Secondary | ICD-10-CM | POA: Diagnosis not present

## 2016-11-10 DIAGNOSIS — M7581 Other shoulder lesions, right shoulder: Secondary | ICD-10-CM | POA: Diagnosis not present

## 2016-11-10 DIAGNOSIS — M7582 Other shoulder lesions, left shoulder: Secondary | ICD-10-CM | POA: Diagnosis not present

## 2016-11-16 DIAGNOSIS — L298 Other pruritus: Secondary | ICD-10-CM | POA: Diagnosis not present

## 2016-11-16 DIAGNOSIS — L308 Other specified dermatitis: Secondary | ICD-10-CM | POA: Diagnosis not present

## 2016-11-16 DIAGNOSIS — Z85820 Personal history of malignant melanoma of skin: Secondary | ICD-10-CM | POA: Diagnosis not present

## 2016-11-16 DIAGNOSIS — L281 Prurigo nodularis: Secondary | ICD-10-CM | POA: Diagnosis not present

## 2016-11-16 DIAGNOSIS — Z79899 Other long term (current) drug therapy: Secondary | ICD-10-CM | POA: Diagnosis not present

## 2016-11-16 DIAGNOSIS — L570 Actinic keratosis: Secondary | ICD-10-CM | POA: Diagnosis not present

## 2016-11-30 DIAGNOSIS — M4712 Other spondylosis with myelopathy, cervical region: Secondary | ICD-10-CM | POA: Diagnosis not present

## 2016-11-30 DIAGNOSIS — M19019 Primary osteoarthritis, unspecified shoulder: Secondary | ICD-10-CM | POA: Diagnosis not present

## 2016-11-30 DIAGNOSIS — M25519 Pain in unspecified shoulder: Secondary | ICD-10-CM | POA: Diagnosis not present

## 2016-11-30 DIAGNOSIS — R030 Elevated blood-pressure reading, without diagnosis of hypertension: Secondary | ICD-10-CM | POA: Diagnosis not present

## 2016-11-30 DIAGNOSIS — M542 Cervicalgia: Secondary | ICD-10-CM | POA: Diagnosis not present

## 2017-01-23 ENCOUNTER — Ambulatory Visit (INDEPENDENT_AMBULATORY_CARE_PROVIDER_SITE_OTHER): Payer: Medicare Other | Admitting: Student

## 2017-01-23 ENCOUNTER — Encounter: Payer: Self-pay | Admitting: Student

## 2017-01-23 VITALS — BP 118/78 | HR 84 | Temp 99.3°F | Wt 150.0 lb

## 2017-01-23 DIAGNOSIS — M25571 Pain in right ankle and joints of right foot: Secondary | ICD-10-CM | POA: Diagnosis present

## 2017-01-23 NOTE — Assessment & Plan Note (Signed)
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 Exam with tenderness to palpation over peroneal tendons below his lateral malleolus with plantar flexion and resisted abduction at ankle joint. No tenderness over bony parts. Can't rule out osteoarthritis. No signs of septic joints or gout. Recommended using ibuprofen 600 mg up to 3 times a day as needed for pain. Also recommended ankle sleeve with good fitting shoe. Also recommended icing after walking for up to 15 minutes. Follow-up in 2-3 weeks if no improvement in his pain. We will consider getting an x-ray or referral to sport medicine.  I also encouraged him to follow-up with PCP on his bilateral leg swelling.

## 2017-01-23 NOTE — Progress Notes (Signed)
Subjective:    Brian Leon is a 71 y.o. old male here for ankle pain on SDA  HPI Right ankle pain: for three weeks. No specific inciting factor. He reports walking daily. He was unable to walk due to pain recently. Pain is over the lateral aspect of his ankle below his lateral malleolus. Pain is achy and worse with walking. Denies injury or trauma. However he reports history of hip fracture in 1992. He had external fixation. He also reports bilateral leg swelling. He has tried ibuprofen which has helped with the pain. He denies fever or chills.   PMH/Problem List: has VITAMIN D DEFICIENCY; CARPAL TUNNEL SYNDROME, RIGHT; RHINITIS, ALLERGIC; Asthma, chronic; PSORIASIS; SPONDYLOSIS, CERVICAL; OSTEOARTHRITIS OF SPINE, NOS; OTHER OSTEOPOROSIS; OSTEOPENIA; BENIGN PROSTATIC HYPERTROPHY, HX OF; Inguinal hernia; Ureteral stone with hydronephrosis; Essential hypertension, benign; Healthcare maintenance; Breast mass in male; Renal mass; Left arm pain; Lower extremity edema; Constipation; Cervical radiculopathy at C7; Herniated cervical disc without myelopathy; and Acute right ankle pain on his problem list.   has a past medical history of Allergic rhinitis; Anemia; Arthritis; Asthma; Bilateral ureteral calculi; Blind right eye; Cancer ([place]); Complication of anesthesia; Eczema; Family history of adverse reaction to anesthesia; Headache; Hepatitis; History of bladder stone; History of irritable bowel syndrome; History of kidney stones; Hypertension; Osteoporosis; Paraureteric diverticulum; Pneumonia; Prosthetic eye globe; Renal cyst, right; and Seasonal allergies.  FH:  Family History  Problem Relation Age of Onset  . Cancer Mother     [facility] Social History  Substance Use Topics  . Smoking status: Former Smoker    Years: 20.00    Types: Cigarettes    Quit date: 12/13/1983  . Smokeless tobacco: Never Used  . Alcohol use No     Comment: quit drinking 30 years ago    Review of Systems  Constitutional: Negative  for chills, diaphoresis, fatigue, fever and unexpected weight change.  Respiratory: Negative for cough and shortness of breath.   Cardiovascular: Positive for leg swelling. Negative for chest pain.  Gastrointestinal: Negative for abdominal pain and blood in stool.  Genitourinary: Negative for dysuria.  Musculoskeletal: Positive for arthralgias.  Skin: Negative for color change.  Hematological: Negative for adenopathy. Does not bruise/bleed easily.      Objective:     Vitals:   01/23/17 1422  BP: 118/78  Pulse: 84  Temp: 99.3 F (37.4 C)  TempSrc: Oral  SpO2: 97%  Weight: 150 lb (68 kg)    Physical Exam GEN: appears well, no apparent distress. HEM: negative for cervical or periauricular lymphadenopathies CVS: RRR, nl S1&S2,  1+ edema bilaterally,  2+ DP & PT pulses bilaterally RESP: speaks in full sentence, no IWOB, good air movement bilaterally, CTAB GI: BS present & normal, soft, NTND MSK:  Right Ankle:  No visible erythema or deformity. Trace edema bilaterally. Significant valgus deformity bilaterally with both feet point laterally.   Some Tenderness to palpation over peroneal tendons below his lateral malleolus with plantar flexion and resisted abduction at ankle joint  Range of motion is full in all directions.  Strength is 5/5 in all directions (dorsal & plantar flexion, abduction & adduction, eversion and inversion). Light sensation intact in all dermatomes.  Stable lateral and medial ligaments; squeeze test unremarkable  Able to walk, no obvious pronation or supination.    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 Exam with tenderness to palpation over peroneal tendons below his lateral malleolus with plantar flexion  and resisted abduction at ankle joint. No tenderness over bony parts. Can't rule out osteoarthritis. No signs of septic joints or gout. Recommended using ibuprofen 600  mg up to 3 times a day as needed for pain. Also recommended ankle sleeve with good fitting shoe. Also recommended icing after walking for up to 15 minutes. Follow-up in 2-3 weeks if no improvement in his pain. We will consider getting an x-ray or referral to sport medicine.  I also encouraged him to follow-up with PCP on his bilateral leg swelling.   Return if symptoms worsen or fail to improve.  Mercy Riding, MD 01/23/17 Pager: [phone]

## 2017-01-23 NOTE — Patient Instructions (Signed)
It was great seeing you today! We have addressed the following issues today 1. Ankle pain: This is likely due to inflammation of the tendons (peroneal tendinitis). You can continue Advil 600 mg 3 times a day as needed for pain. I also recommend an ankle sleeve with a good fitting shoe. You can continue walking with pain being to guide. I recommend icing for at least 15-20 minutes after walking. If no improvement with the above measures in the next 3-4 weeks, I recommend coming back for evaluation.  2.  Leg swelling: I recommend you follow up with your primary care doctor on this.    If we did any lab work today, and the results require attention, either me or my nurse will get in touch with you. If everything is normal, you will get a letter in mail and a message via . If you don't hear from [place] in two weeks, please give [place] a call. Otherwise, we look forward to seeing you again at your next visit. If you have any questions or concerns before then, please call the clinic at [phone].  Please bring all your medications to every doctors visit  Sign up for My Chart to have easy access to your labs results, and communication with your Primary care physician.    Please check-out at the front desk before leaving the clinic.    Take Care,   Dr. Cyndia Skeeters  Peroneal Tendinopathy Peroneal tendinopathy is irritation of the tendons that pass behind your ankle (peroneal tendons). These tendons attach muscles in your foot to a bone on the side of your foot and underneath the arch of your foot. This condition can cause your peroneal tendons to get bigger and swell. What are the causes? This condition may be caused by:  Putting stress on your ankle over and over again (overuse injury).  A sudden injury that puts stress on your tendons, such as an ankle sprain. What increases the risk? This condition is more likely to develop in:  People who have high arches.  Athletes who play sports that involve  putting stress on the ankle over and over again. These sports include:  Running.  Dancing.  Soccer.  Basketball. What are the signs or symptoms? Symptoms of this condition can start suddenly or develop gradually. Symptoms include:  Pain in the back of the ankle, on the side of the foot, or in the arch of the foot.  Pain that gets worse with activity and better with rest.  Swelling.  Warmth.  Weakness in your foot or ankle. How is this diagnosed? This condition may be diagnosed based on:  Your symptoms.  Your medical history.  A physical exam.  Imaging tests, such as:  An X-ray or CT scan to check for bone injury.  MRI or ultrasound to check for muscle or tendon injury. During your physical exam, your health care provider may move your foot and ankle and test the strength of your leg muscles. How is this treated? This condition may be treated by:  Keeping your body weight off your ankle for several days.  Returning gradually to full activity gradually.  Putting ice on your ankle to reduce swelling.  Taking an anti-inflammatory pain medicine (NSAID).  Having medicine injected into your tendon to reduce swelling.  Wearing a removable boot or brace for ankle support.  Doing range-of-motion exercises and strengthening exercises (physical therapy) when pain and swelling improve. If the condition does not improve with treatment, or if a tendon  or muscle is damaged, surgery may be needed. Follow these instructions at home: If you have a boot or brace:   Wear it as told by your health care provider. Remove it only as told by your health care provider.  Loosen it if your toes tingle, become numb, or turn cold and blue.  Do not let it get wet if it is not waterproof.  Keep it clean. Managing pain, stiffness, and swelling   If directed, apply ice to the injured area:  Put ice in a plastic bag.  Place a towel between your skin and the bag.  Leave the ice on  for 20 minutes, 2-3 times a day.  Take over-the-counter and prescription medicines only as told by your health care provider.  Raise (elevate) your ankle above the level of your heart when resting if you have swelling. Activity   Do not use your ankle to support (bear) your full body weight until your health care provider says that you can.  Do not do activities that make pain or swelling worse.  Return to your normal activities as told by your health care provider. General instructions   Keep all follow-up visits as told by your health care provider. This is important. How is this prevented?  Wear supportive footwear that is appropriate for your athletic activity.  Avoid athletic activities that cause swelling or pain in your ankle or foot.  See your health care provider if you have pain or swelling that does not improve after a few days of rest.  Stop training if you develop pain or swelling.  If you start a new athletic activity, start gradually to build up your strength, endurance, and flexibility. Contact a health care provider if:  Your symptoms get worse.  Your symptoms do not improve in 2-4 weeks.  You develop new, unexplained symptoms. This information is not intended to replace advice given to you by your health care provider. Make sure you discuss any questions you have with your health care provider. Document Released: 09/19/2005 Document Revised: 05/24/2016 Document Reviewed: 08/08/2015 Elsevier Interactive Patient Education  2017 [employer].

## 2017-01-25 DIAGNOSIS — M5412 Radiculopathy, cervical region: Secondary | ICD-10-CM | POA: Diagnosis not present

## 2017-01-25 DIAGNOSIS — R1310 Dysphagia, unspecified: Secondary | ICD-10-CM | POA: Diagnosis not present

## 2017-01-25 DIAGNOSIS — M542 Cervicalgia: Secondary | ICD-10-CM | POA: Diagnosis not present

## 2017-01-25 DIAGNOSIS — M19019 Primary osteoarthritis, unspecified shoulder: Secondary | ICD-10-CM | POA: Diagnosis not present

## 2017-01-25 DIAGNOSIS — M4712 Other spondylosis with myelopathy, cervical region: Secondary | ICD-10-CM | POA: Diagnosis not present

## 2017-02-09 DIAGNOSIS — M7581 Other shoulder lesions, right shoulder: Secondary | ICD-10-CM | POA: Diagnosis not present

## 2017-02-09 DIAGNOSIS — M7582 Other shoulder lesions, left shoulder: Secondary | ICD-10-CM | POA: Diagnosis not present

## 2017-03-21 ENCOUNTER — Encounter: Payer: Self-pay | Admitting: Family Medicine

## 2017-03-21 ENCOUNTER — Ambulatory Visit (INDEPENDENT_AMBULATORY_CARE_PROVIDER_SITE_OTHER): Payer: Medicare Other | Admitting: Family Medicine

## 2017-03-21 VITALS — BP 112/62 | HR 69 | Temp 98.6°F | Wt 141.0 lb

## 2017-03-21 DIAGNOSIS — R05 Cough: Secondary | ICD-10-CM

## 2017-03-21 DIAGNOSIS — R059 Cough, unspecified: Secondary | ICD-10-CM

## 2017-03-21 MED ORDER — GUAIFENESIN-CODEINE 100-10 MG/5ML PO SYRP: 5.0000 mL | ORAL_SOLUTION | Freq: Three times a day (TID) | ORAL | 0 refills | Status: DC | PRN

## 2017-03-21 MED ORDER — AZITHROMYCIN 250 MG PO TABS: ORAL_TABLET | ORAL | 0 refills | Status: AC

## 2017-03-21 NOTE — Progress Notes (Signed)
    Subjective:  Brian Leon is a 71 y.o. male who presents to the [facility] today with a chief complaint of cough.   HPI:  Cough Symptoms started about 2.5 weeks. Symptoms include rhinorrhea, mild diarrhea, watery eyes, some vomiting. Patient was recently in [place] for his vacation. Took advil which helped some with the fever. Feeling better today. Cough is keeping the patient awake at night. Max temperature of 101F.   ROS: Per HPI  PMH: Smoking history reviewed.   Objective:  Physical Exam: BP 112/62   Pulse 69   Temp 98.6 F (37 C) (Oral)   Wt 141 lb (64 kg)   SpO2 99%   BMI 20.82 kg/m   Gen: NAD, resting comfortably CV: RRR with no murmurs appreciated Pulm: NWOB, Occasional ronchi noted with no wheezes. MSK: no edema, cyanosis, or clubbing noted Skin: warm, dry Neuro: grossly normal, moves all extremities Psych: Normal affect and thought content  Assessment/Plan:  Cough Given that he has had symptoms for over 2 weeks, it is reasonable to treat him with a course of antibiotics. Will give him a zpac. Given the severity of cough that is keeping him up at night, will also send in guaifenesin with codeine. Return precautions reviewed. Follow up as needed.   Algis Greenhouse. Jerline Pain, [place] Resident PGY-3 03/21/2017 10:44 AM

## 2017-03-28 ENCOUNTER — Telehealth: Payer: Self-pay | Admitting: *Deleted

## 2017-03-28 ENCOUNTER — Ambulatory Visit (INDEPENDENT_AMBULATORY_CARE_PROVIDER_SITE_OTHER): Payer: Medicare Other | Admitting: Family Medicine

## 2017-03-28 ENCOUNTER — Encounter: Payer: Self-pay | Admitting: Family Medicine

## 2017-03-28 ENCOUNTER — Telehealth: Payer: Self-pay | Admitting: Family Medicine

## 2017-03-28 ENCOUNTER — Ambulatory Visit
Admission: RE | Admit: 2017-03-28 | Discharge: 2017-03-28 | Disposition: A | Discharge status: Home / Self Care | Payer: Medicare Other | Source: Ambulatory Visit | Attending: Family Medicine | Admitting: Family Medicine

## 2017-03-28 VITALS — BP 108/68 | HR 69 | Temp 97.9°F | Ht 69.0 in | Wt 144.2 lb

## 2017-03-28 DIAGNOSIS — R0600 Dyspnea, unspecified: Secondary | ICD-10-CM

## 2017-03-28 DIAGNOSIS — R5383 Other fatigue: Secondary | ICD-10-CM

## 2017-03-28 DIAGNOSIS — R05 Cough: Secondary | ICD-10-CM | POA: Diagnosis not present

## 2017-03-28 DIAGNOSIS — R0609 Other forms of dyspnea: Secondary | ICD-10-CM

## 2017-03-28 DIAGNOSIS — R1900 Intra-abdominal and pelvic swelling, mass and lump, unspecified site: Secondary | ICD-10-CM

## 2017-03-28 DIAGNOSIS — M2550 Pain in unspecified joint: Secondary | ICD-10-CM

## 2017-03-28 LAB — POCT SEDIMENTATION RATE: POCT SED RATE: 53 mm/hr — AB (ref 0–22)

## 2017-03-28 MED ORDER — ALBUTEROL SULFATE HFA 108 (90 BASE) MCG/ACT IN AERS: 1.0000 | INHALATION_SPRAY | Freq: Four times a day (QID) | RESPIRATORY_TRACT | 0 refills | Status: DC | PRN

## 2017-03-28 MED ORDER — FLUTICASONE-SALMETEROL 500-50 MCG/DOSE IN AEPB: INHALATION_SPRAY | RESPIRATORY_TRACT | 4 refills | Status: DC

## 2017-03-28 MED ORDER — FLUTICASONE PROPIONATE 50 MCG/ACT NA SUSP: 2.0000 | Freq: Every day | NASAL | 4 refills | Status: DC

## 2017-03-28 NOTE — Progress Notes (Signed)
Subjective: CC: hand pain HPI: Brian Leon is a 71 y.o. male presenting to clinic today for:  1. Hand pain Patient reports bilateral hand pain at the MCP and ulnar aspects of hands.  He reports that sometimes he has difficulty buttoning his shirt.  He saw Dr Alfonso Ramus and had shoulder injections in May before he went to [place].  He is getting associated tingling in the hands.  He had bilateral carpal tunnel surgery in the past.  He reports malaise and decreased ability to get out of a chair.  He reports feeling achy.  He did have a cold w/ cough in [place].  no imaging was done there.  He did see a physician there and had labs done.  He was told he had anemia.  He notes that this lasted for about 3 weeks.  He has not yet returned to baseline. Denies temporal headache, visual changes.  He notes he has intermittent headaches/ neck aches at the base of his skull which has been chronic since his cervical spine surgeries.    2. Left flank/ abdominal mass Reports has been there about 6 months.  He denies pain or enlargement.  Had partial nephrectomy on the right.  He notes he has abdominal mesh in place.  Has h/o inguinal hernia repair as well.  Social Hx reviewed. MedHx, medications and allergies reviewed.  Please see EMR. ROS: Per HPI  Objective: Office vital signs reviewed. BP 108/68   Pulse 69   Temp 97.9 F (36.6 C) (Oral)   Ht 5\' 9"  (1.753 m)   Wt 144 lb 3.2 oz (65.4 kg)   SpO2 97%   BMI 21.29 kg/m   Physical Examination:  General: Awake, alert, frail looking male, No acute distress HEENT: right eye with deformity, MMM; no JVD Cardio: regular rate and rhythm, S1S2 heard, no murmurs appreciated Pulm: clear to auscultation bilaterally, no wheezes, rhonchi or rales; normal work of breathing on room air GI: soft, non-tender, non-distended, bowel sounds present x4, no hepatomegaly, no splenomegaly; left anterio/flank with baseball sized mass that reduces in the supine position.   Area is nontender/ nonfluctuant.  No palpable muscular defect. Well healed midline abdominal scar. Extremities: warm, well perfused, No edema, cyanosis or clubbing; +2 pulses bilaterally MSK: no joint swelling appreciated in the hands. No erythema or deformity.  Has full AROM.  Left bicep with marble sized well circumscribed rubbery mass.  Nontender. No surrounding fluctuance or erythema.  Ambulating independently.  5/5 UE and LE strength.  Results for orders placed or performed in visit on 03/28/17 (from the past 24 hour(s))  POCT SEDIMENTATION RATE     Status: Abnormal   Collection Time: 03/28/17 10:15 AM  Result Value Ref Range   POCT SED RATE 53 (A) 0 - 22 mm/hr   Assessment/ Plan: 71 y.o. male   1. Polyarthralgia.  Associated intermittent numbness in hands.  Could definitely be OA.  No swelling or deformity to suggest RA but will evaluate further for autoimmune component in the setting of associated fatigue, DOE and sensation of girdle weakness.  DDx SLE, Polymyalgia rheumatica, RA, OA.  Nothing on exam to suggest septic joints.  ?viral mediated process, given recent travel to [place].  He had a cold there.  CXR ordered to evaluate further.  Additionally, nothing on exam or in hx to suggest temporal arteritis.  - ANA - POCT SEDIMENTATION RATE - Rheumatoid factor - C-reactive protein - Basic metabolic panel - Will contact with results.  2. Fatigue, unspecified type. ?post viral vs immune d/o.  Could consider TSH at next visit if other labs unremarkable and patient having persistent symptoms.  Some concern w/ report of DOE.  Could also consider w/u for CHF though no evidence of CHF/ fluid overload on exam.  Cardiopulmonary exam unremarkable. - ANA - POCT SEDIMENTATION RATE - Rheumatoid factor - C-reactive protein - Basic metabolic panel - CBC  3. Dyspnea on exertion, see above - DG Chest 2 View; Future  4. Left flank mass.  ?incisional hernia vs spigellian hernia (though somewhat  lateral to be this) vs soft tissue mass. - CT ABDOMEN WO CONTRAST; Future.  Scheduled for 04/03/17. - Will contact with results.  Patient to follow up in 1 week for review of labs and imaging study.   Janora Norlander, DO PGY-3, [facility]

## 2017-03-28 NOTE — Telephone Encounter (Signed)
Thanks!  Referral placed

## 2017-03-28 NOTE — Telephone Encounter (Signed)
LVM for pt to call back to give him the below information, if he calls back please inform him of this. Brian Leon, April D, [place]

## 2017-03-28 NOTE — Telephone Encounter (Signed)
Dr. Kieth Brightly w/[facility] is the doctor who did his hernia surgery. (he said you needed to know)

## 2017-03-28 NOTE — Addendum Note (Signed)
Addended by: Janora Norlander on: 03/28/2017 02:44 PM   Modules accepted: Orders

## 2017-03-28 NOTE — Telephone Encounter (Signed)
-----   Message from Janora Norlander, DO sent at 03/28/2017  1:36 PM EDT ----- CXR unremarkable.  Please call patient to inform.  Will contact with results of labs once they come back.

## 2017-03-28 NOTE — Patient Instructions (Addendum)
I will contact you will the results of your labs.  If anything is abnormal, I will call you.  Otherwise, expect a copy to be mailed to you.

## 2017-03-29 ENCOUNTER — Telehealth: Payer: Self-pay | Admitting: Family Medicine

## 2017-03-29 LAB — CBC
Hematocrit: 34.1 % — ABNORMAL LOW (ref 37.5–51.0)
Hemoglobin: 10.5 g/dL — ABNORMAL LOW (ref 13.0–17.7)
MCH: 29.5 pg (ref 26.6–33.0)
MCHC: 30.8 g/dL — ABNORMAL LOW (ref 31.5–35.7)
MCV: 96 fL (ref 79–97)
Platelets: 544 10*3/uL — ABNORMAL HIGH (ref 150–379)
RBC: 3.56 x10E6/uL — ABNORMAL LOW (ref 4.14–5.80)
RDW: 16.8 % — ABNORMAL HIGH (ref 12.3–15.4)
WBC: 11.1 10*3/uL — ABNORMAL HIGH (ref 3.4–10.8)

## 2017-03-29 LAB — BASIC METABOLIC PANEL
BUN/Creatinine Ratio: 18 (ref 10–24)
BUN: 17 mg/dL (ref 8–27)
CO2: 23 mmol/L (ref 20–29)
Calcium: 9.4 mg/dL (ref 8.6–10.2)
Chloride: 107 mmol/L — ABNORMAL HIGH (ref 96–106)
Creatinine, Ser: 0.95 mg/dL (ref 0.76–1.27)
GFR calc Af Amer: 91 mL/min/{1.73_m2} (ref >59)
GFR calc non Af Amer: 79 mL/min/{1.73_m2} (ref >59)
Glucose: 72 mg/dL (ref 65–99)
Potassium: 4.4 mmol/L (ref 3.5–5.2)
Sodium: 141 mmol/L (ref 134–144)

## 2017-03-29 LAB — RHEUMATOID FACTOR: Rhuematoid fact SerPl-aCnc: <10 IU/mL (ref 0.0–13.9)

## 2017-03-29 LAB — ANA: Anti Nuclear Antibody(ANA): NEGATIVE

## 2017-03-29 LAB — C-REACTIVE PROTEIN: CRP: 14.7 mg/L — ABNORMAL HIGH (ref 0.0–4.9)

## 2017-03-29 NOTE — Telephone Encounter (Signed)
I attempted to reach patient this morning regarding labs so far.  I am still awaiting ANA result but CRP, ESR are elevated, suggesting an inflammatory/possible autoimmune process.  I also see that his WBC is slightly elevated and that his Platelets are very elevated.  It seems as though he has had a thrombocytosis in the past, and for this reason, I recommend a referral to hematology.  I would like him to schedule an appointment with our office for complete review of his labs and referrals to hematology and possibly rheumatology.  This appointment should be sometime in the next week.  His new PCP is Dr Juanito Doom, who I will send a message to as well.   Ashly M. Lajuana Ripple, DO PGY-3, [facility]

## 2017-03-31 ENCOUNTER — Encounter: Payer: Self-pay | Admitting: Family Medicine

## 2017-04-03 ENCOUNTER — Ambulatory Visit (HOSPITAL_COMMUNITY): Payer: Medicare Other

## 2017-04-11 ENCOUNTER — Ambulatory Visit (HOSPITAL_COMMUNITY)
Admission: RE | Admit: 2017-04-11 | Discharge: 2017-04-11 | Disposition: A | Discharge status: Home / Self Care | Payer: Medicare Other | Source: Ambulatory Visit | Attending: Family Medicine | Admitting: Family Medicine

## 2017-04-11 DIAGNOSIS — I700 Atherosclerosis of aorta: Secondary | ICD-10-CM | POA: Insufficient documentation

## 2017-04-11 DIAGNOSIS — N281 Cyst of kidney, acquired: Secondary | ICD-10-CM | POA: Insufficient documentation

## 2017-04-11 DIAGNOSIS — R1900 Intra-abdominal and pelvic swelling, mass and lump, unspecified site: Secondary | ICD-10-CM | POA: Diagnosis not present

## 2017-04-11 DIAGNOSIS — M4856XA Collapsed vertebra, not elsewhere classified, lumbar region, initial encounter for fracture: Secondary | ICD-10-CM | POA: Diagnosis not present

## 2017-04-11 DIAGNOSIS — R590 Localized enlarged lymph nodes: Secondary | ICD-10-CM | POA: Insufficient documentation

## 2017-04-11 NOTE — Telephone Encounter (Signed)
Patient has an appointment on 04-12-17 with new PCP. Cas Tracz,CMA

## 2017-04-12 ENCOUNTER — Encounter: Payer: Self-pay | Admitting: Family Medicine

## 2017-04-12 ENCOUNTER — Ambulatory Visit (INDEPENDENT_AMBULATORY_CARE_PROVIDER_SITE_OTHER): Payer: Medicare Other | Admitting: Family Medicine

## 2017-04-12 VITALS — BP 124/78 | HR 75 | Temp 98.9°F | Ht 69.0 in | Wt 147.0 lb

## 2017-04-12 DIAGNOSIS — M2550 Pain in unspecified joint: Secondary | ICD-10-CM | POA: Insufficient documentation

## 2017-04-12 DIAGNOSIS — R0600 Dyspnea, unspecified: Secondary | ICD-10-CM

## 2017-04-12 DIAGNOSIS — R609 Edema, unspecified: Secondary | ICD-10-CM | POA: Diagnosis not present

## 2017-04-12 DIAGNOSIS — D473 Essential (hemorrhagic) thrombocythemia: Secondary | ICD-10-CM

## 2017-04-12 DIAGNOSIS — D75839 Thrombocytosis, unspecified: Secondary | ICD-10-CM | POA: Insufficient documentation

## 2017-04-12 DIAGNOSIS — R5383 Other fatigue: Secondary | ICD-10-CM | POA: Diagnosis not present

## 2017-04-12 DIAGNOSIS — R600 Localized edema: Secondary | ICD-10-CM | POA: Diagnosis not present

## 2017-04-12 DIAGNOSIS — D649 Anemia, unspecified: Secondary | ICD-10-CM | POA: Diagnosis not present

## 2017-04-12 HISTORY — DX: Thrombocytosis, unspecified: D75.839

## 2017-04-12 NOTE — Assessment & Plan Note (Signed)
Possibly secondary to anemia. However patient also has polyarthralgia, lower extremity edema, and self-reported worsening dyspnea from baseline. No signs of depression on today's exam -Check TSH, BNP, and anemia panel

## 2017-04-12 NOTE — Assessment & Plan Note (Addendum)
New problem as of a few months ago which is associated with fatigue, weakness, headaches, subjective fevers. ANA and rheumatoid factor negative. Elevated CRP and ESR. Unclear etiology. Concerning for worsening osteoarthritis (all osteoarthritis typically does not cause the other symptoms he is having) versus autoimmune process (although slightly unusual that ANA and rheumatoid factor are negative). Discussed patient with Dr. Nori Riis on the phone. The plan is as follows; -We will draw a few more autoimmune labs including C3 and C4,  SPEP, antiphospholipid panel -Continue Advil when necessary -We will make more solid plan once the follow-up labs return, may need to refer to rheumatology

## 2017-04-12 NOTE — Progress Notes (Signed)
Subjective:    Patient ID: Brian Leon , male   DOB: 01-20-1946 , 71 y.o..   MRN: [id number]  HPI  Brian Leon is A 71 year old male with history of psoriasis, cervical spine fusion, hypertension here for  Chief Complaint  Patient presents with  . Follow Up Labs   1. Follow up for polyarthralgia and fatigue: Patient noted to have polyarthralgia and fatigue and previous visit on 03/28/17. This has been going on for a couple months now with associated tingling in his hands. Difficulty buttoning his shirt and weakness. Autoimmune labs were drawn and showed elevated ESR and CRP. ANA and rheumatic factor were both negative. Patient notes that he thinks that his symptoms are getting worse. He also endorses edema of his upper and lower extremities. Also endorses some shortness of breath or so than usual. He does have baseline asthma the patient notes that he is unable to walk hills like he was before. No shortness of breath at baseline. Also endorsing intermittent headaches and subjective fevers. He has tried Advil for the headaches but that doesn't seem to help much. Denies any chest pain, palpitations.  Review of Systems: Per HPI.   Past Medical History: Patient Active Problem List   Diagnosis Date Noted  . Polyarthralgia 04/12/2017  . Thrombocytosis ([place]) 04/12/2017  . Anemia 04/12/2017  . Fatigue 04/12/2017  . Acute right ankle pain 01/23/2017  . Herniated cervical disc without myelopathy 07/19/2016  . Cervical radiculopathy at C7 01/12/2015  . Constipation 12/30/2014  . Left arm pain 11/25/2014  . Lower extremity edema 11/25/2014  . Renal mass 10/08/2014  . Breast mass in male 08/21/2014  . Essential hypertension, benign 02/19/2014  . Healthcare maintenance 02/19/2014  . Ureteral stone with hydronephrosis 11/26/2013  . Inguinal hernia 12/13/2010  . VITAMIN D DEFICIENCY 06/11/2009  . PSORIASIS 06/11/2009  . CARPAL TUNNEL SYNDROME, RIGHT 06/17/2008  . BENIGN PROSTATIC  HYPERTROPHY, HX OF 06/17/2008  . SPONDYLOSIS, CERVICAL 01/31/2008  . OTHER OSTEOPOROSIS 08/06/2007  . RHINITIS, ALLERGIC 11/30/2006  . Asthma, chronic 11/30/2006  . OSTEOARTHRITIS OF SPINE, NOS 11/30/2006  . OSTEOPENIA 11/30/2006    Medications: reviewed  Current Outpatient Prescriptions  Medication Sig Dispense Refill  . albuterol (PROVENTIL HFA;VENTOLIN HFA) 108 (90 Base) MCG/ACT inhaler Inhale 1-2 puffs into the lungs every 6 (six) hours as needed for wheezing or shortness of breath. 25.5 g 0  . cycloSPORINE modified (NEORAL/GENGRAF) 100 MG capsule Take 100 mg by mouth daily.     . finasteride (PROSCAR) 5 MG tablet Take 5 mg by mouth daily.    . fluticasone (FLONASE) 50 MCG/ACT nasal spray Place 2 sprays into both nostrils daily. 48 g 4  . Fluticasone-Salmeterol (ADVAIR DISKUS) 500-50 MCG/DOSE AEPB INHALE 1 PUFF INTO THE LUNGS TWICE A DAY 180 each 4   No current facility-administered medications for this visit.     Social Hx:  reports that he quit smoking about 33 years ago. His smoking use included Cigarettes. He quit after 20.00 years of use. He has never used smokeless tobacco.   Objective:   BP 124/78   Pulse 75   Temp 98.9 F (37.2 C) (Oral)   Ht '5\' 9"'  (1.753 m)   Wt 147 lb (66.7 kg)   SpO2 98%   BMI 21.71 kg/m  Physical Exam  Gen: NAD, alert, cooperative with exam, well-appearing, slender HEENT: NCAT, PERRL, clear conjunctiva, oropharynx clear, supple neck Cardiac: Regular rate and rhythm, normal S1/S2, no murmur, capillary refill brisk, 2+ pitting  edema from knee down bilaterally  Respiratory: Clear to auscultation bilaterally, no wheezes, non-labored breathing Neurological: Antalgic gait, alert and oriented 3, 4 out of 5 strength throughout in bilateral upper extremities, 5 out of 5 strength in bilateral lower extremities MSK: Joint tenderness to palpation in bilateral wrists, no bony protrusions noted in hand joints Psych: good insight, normal mood and  affect  Assessment & Plan:  Polyarthralgia New problem as of a few months ago which is associated with fatigue, weakness, headaches, subjective fevers. ANA and rheumatoid factor negative. Elevated CRP and ESR. Unclear etiology. Concerning for worsening osteoarthritis (all osteoarthritis typically does not cause the other symptoms he is having) versus autoimmune process (although slightly unusual that ANA and rheumatoid factor are negative). Discussed patient with Dr. Nori Riis on the phone. The plan is as follows; -We will draw a few more autoimmune labs including C3 and C4,  SPEP, antiphospholipid panel -Continue Advil when necessary -We will make more solid plan once the follow-up labs return, may need to refer to rheumatology    Lower extremity edema Unclear etiology, has coincided with polyarthralgia as above. Less likely just dependent edema. Differentials include autoimmune process versus renal pathology (although creatinine reassuringly normal) versus cirrhosis versus new CHF (did endorse some increased shortness of breath that is more than his baseline asthma) -We will order CMP, TSH, BNP, and autoimmune labs as mentioned previously -Discussed compression stockings -Follow-up after labs return  Thrombocytosis ([place]) Platelets elevated to 544 on recent labs. Unclear etiology. -We will order repeat CMP as well as peripheral smear -May need referral to hematology based off of results   Anemia Hemoglobin 10.5 with MCV of 96. Looks like normocytic anemia and possibly macrocytic based off of borderline high MCV. No bloody urine or stools. Patient is overdue for his colonoscopy. -Order anemia panel -Follow-up after results -Can consider doing colonoscopy versus stool card in future  Fatigue Possibly secondary to anemia. However patient also has polyarthralgia, lower extremity edema, and self-reported worsening dyspnea from baseline. No signs of depression on today's exam -Check TSH, BNP, and  anemia panel  Orders Placed This Encounter  Procedures  . TSH    Standing Status:   Future    Standing Expiration Date:   04/12/2018  . Hepatic Function Panel    Standing Status:   Future    Standing Expiration Date:   04/12/2018  . CK    Standing Status:   Future    Standing Expiration Date:   04/12/2018  . Save smear    Standing Status:   Future    Standing Expiration Date:   04/12/2018  . C3 and C4    Standing Status:   Future    Standing Expiration Date:   04/12/2018  . Protein electrophoresis, serum    Standing Status:   Future    Standing Expiration Date:   04/12/2018  . Brain natriuretic peptide    Standing Status:   Future    Standing Expiration Date:   04/12/2018  . Anemia panel    Standing Status:   Future    Standing Expiration Date:   04/12/2018  . Antiphospholipid Syndrome Diagnostic Panel    Standing Status:   Future    Standing Expiration Date:   04/12/2018    Smitty Cords, MD [place], PGY-3

## 2017-04-12 NOTE — Assessment & Plan Note (Signed)
Hemoglobin 10.5 with MCV of 96. Looks like normocytic anemia and possibly macrocytic based off of borderline high MCV. No bloody urine or stools. Patient is overdue for his colonoscopy. -Order anemia panel -Follow-up after results -Can consider doing colonoscopy versus stool card in future

## 2017-04-12 NOTE — Assessment & Plan Note (Signed)
Unclear etiology, has coincided with polyarthralgia as above. Less likely just dependent edema. Differentials include autoimmune process versus renal pathology (although creatinine reassuringly normal) versus cirrhosis versus new CHF (did endorse some increased shortness of breath that is more than his baseline asthma) -We will order CMP, TSH, BNP, and autoimmune labs as mentioned previously -Discussed compression stockings -Follow-up after labs return

## 2017-04-12 NOTE — Assessment & Plan Note (Addendum)
Platelets elevated to 544 on recent labs. Unclear etiology. -We will order repeat CMP as well as peripheral smear -May need referral to hematology based off of results

## 2017-04-13 ENCOUNTER — Other Ambulatory Visit: Payer: Medicare Other

## 2017-04-13 DIAGNOSIS — D649 Anemia, unspecified: Secondary | ICD-10-CM | POA: Diagnosis not present

## 2017-04-13 DIAGNOSIS — R0600 Dyspnea, unspecified: Secondary | ICD-10-CM | POA: Diagnosis not present

## 2017-04-13 DIAGNOSIS — M2550 Pain in unspecified joint: Secondary | ICD-10-CM | POA: Diagnosis not present

## 2017-04-13 DIAGNOSIS — D473 Essential (hemorrhagic) thrombocythemia: Secondary | ICD-10-CM

## 2017-04-13 DIAGNOSIS — D75839 Thrombocytosis, unspecified: Secondary | ICD-10-CM

## 2017-04-13 DIAGNOSIS — R609 Edema, unspecified: Secondary | ICD-10-CM | POA: Diagnosis not present

## 2017-04-15 LAB — ANEMIA PANEL
Ferritin: 105 ng/mL (ref 30–400)
Folate, Hemolysate: 347.1 ng/mL
Folate, RBC: 1078 ng/mL (ref >498)
Hematocrit: 32.2 % — ABNORMAL LOW (ref 37.5–51.0)
Iron Saturation: 12 % — ABNORMAL LOW (ref 15–55)
Iron: 29 ug/dL — ABNORMAL LOW (ref 38–169)
Retic Ct Pct: 1.1 % (ref 0.6–2.6)
Total Iron Binding Capacity: 240 ug/dL — ABNORMAL LOW (ref 250–450)
UIBC: 211 ug/dL (ref 111–343)
Vitamin B-12: 265 pg/mL (ref 232–1245)

## 2017-04-15 LAB — TSH: TSH: 1.51 u[IU]/mL (ref 0.450–4.500)

## 2017-04-15 LAB — HEPATIC FUNCTION PANEL
ALT: 11 IU/L (ref 0–44)
AST: 15 IU/L (ref 0–40)
Albumin: 3.6 g/dL (ref 3.5–4.8)
Alkaline Phosphatase: 109 IU/L (ref 39–117)
Bilirubin Total: 0.3 mg/dL (ref 0.0–1.2)
Bilirubin, Direct: 0.08 mg/dL (ref 0.00–0.40)

## 2017-04-15 LAB — PROTEIN ELECTROPHORESIS, SERUM
A/G Ratio: 0.9 (ref 0.7–1.7)
Albumin ELP: 3 g/dL (ref 2.9–4.4)
Alpha 1: 0.3 g/dL (ref 0.0–0.4)
Alpha 2: 0.8 g/dL (ref 0.4–1.0)
Beta: 0.9 g/dL (ref 0.7–1.3)
Gamma Globulin: 1.5 g/dL (ref 0.4–1.8)
Globulin, Total: 3.4 g/dL (ref 2.2–3.9)
Total Protein: 6.4 g/dL (ref 6.0–8.5)

## 2017-04-15 LAB — C3 AND C4
Complement C3, Serum: 114 mg/dL (ref 82–167)
Complement C4, Serum: 25 mg/dL (ref 14–44)

## 2017-04-15 LAB — CK: Total CK: 38 U/L (ref 24–204)

## 2017-04-15 LAB — BRAIN NATRIURETIC PEPTIDE: BNP: 55.8 pg/mL (ref 0.0–100.0)

## 2017-04-19 ENCOUNTER — Other Ambulatory Visit: Payer: Self-pay | Admitting: Family Medicine

## 2017-04-19 ENCOUNTER — Telehealth: Payer: Self-pay | Admitting: Family Medicine

## 2017-04-19 DIAGNOSIS — D72829 Elevated white blood cell count, unspecified: Secondary | ICD-10-CM

## 2017-04-19 DIAGNOSIS — D75839 Thrombocytosis, unspecified: Secondary | ICD-10-CM

## 2017-04-19 DIAGNOSIS — R5383 Other fatigue: Secondary | ICD-10-CM

## 2017-04-19 DIAGNOSIS — D509 Iron deficiency anemia, unspecified: Secondary | ICD-10-CM

## 2017-04-19 DIAGNOSIS — R9431 Abnormal electrocardiogram [ECG] [EKG]: Secondary | ICD-10-CM

## 2017-04-19 DIAGNOSIS — R911 Solitary pulmonary nodule: Secondary | ICD-10-CM

## 2017-04-19 DIAGNOSIS — D473 Essential (hemorrhagic) thrombocythemia: Secondary | ICD-10-CM

## 2017-04-19 DIAGNOSIS — M7989 Other specified soft tissue disorders: Secondary | ICD-10-CM

## 2017-04-19 NOTE — Telephone Encounter (Signed)
Will forward to Dr. Juanito Doom. Jazmin Hartsell,CMA

## 2017-04-19 NOTE — Telephone Encounter (Signed)
Called patient back. Discussed his lab results that have come back so far:   Additionally, I have discussed the patient's lab findings and imaging with Dr. Nori Riis prior to calling patient.  1. Iron deficiency anemia: Hemoglobin of 10.1 with a decreased iron of 29 and a decreased total iron binding capacity of 240. Offered to send an prescription for iron supplementation but he would like to buy his own as he wants to make sure it is vegan 2. Normal thyroid, C3-C4, CK, BNP 3. Would like to get an echocardiogram to rule out any cardiac pathology for his swelling 4. Would also like to get a CT scan of his chest to further characterize a 8.6 mm nodular density in the right middle lobe of his lung 5. CT scan of the abdomen was showing a Fatty lesion on his abdomen that may represent a lipoma versus liposarcoma. He has an appointment already with general surgery in early August for this. 6. Some labs are still pending for this patient and will follow-up with them 7. Patient is nervous about his health, understandably so. Explained to patient that we are still doing more work up as we're unsure what is going on with him.  Smitty Cords, MD [place], PGY-3

## 2017-04-19 NOTE — Telephone Encounter (Signed)
Pt would like PCP to call with lab results, pt states he is in a lot of pain. ep

## 2017-04-20 NOTE — Progress Notes (Signed)
CT scheduled for 04/26/2017 with an arrival time of 4:15pm. I will call Echo lab tomorrow to schedule the Echo. Ottis Stain, CMA

## 2017-04-20 NOTE — Addendum Note (Signed)
Addended by: Carlyle Dolly on: 04/20/2017 05:45 PM   Modules accepted: Orders

## 2017-04-20 NOTE — Telephone Encounter (Signed)
Additionally, will refer patient to hematology for thrombocytosis, leukocytosis, and anemia. Discussed this with him on the phone.   Smitty Cords, MD [place], PGY-3

## 2017-04-21 NOTE — Progress Notes (Signed)
Spoke to pt. Informed him of both the CT appt and the Echo appt. Ottis Stain, CMA

## 2017-04-21 NOTE — Progress Notes (Signed)
Echo scheduled at [facility], 7/26 at 1:00 pt will need to go to admissions to register. Ottis Stain, CMA

## 2017-04-22 LAB — PATHOLOGIST SMEAR REVIEW
Basophils Absolute: 0 10*3/uL (ref 0.0–0.2)
Basos: 0 %
EOS (ABSOLUTE): 1.7 10*3/uL — ABNORMAL HIGH (ref 0.0–0.4)
Eos: 18 %
Hematocrit: 31.8 % — ABNORMAL LOW (ref 37.5–51.0)
Hemoglobin: 10.1 g/dL — ABNORMAL LOW (ref 13.0–17.7)
Immature Grans (Abs): 0 10*3/uL (ref 0.0–0.1)
Immature Granulocytes: 0 %
Lymphocytes Absolute: 1.6 10*3/uL (ref 0.7–3.1)
Lymphs: 16 %
MCH: 29.5 pg (ref 26.6–33.0)
MCHC: 31.8 g/dL (ref 31.5–35.7)
MCV: 93 fL (ref 79–97)
Monocytes Absolute: 0.8 10*3/uL (ref 0.1–0.9)
Monocytes: 9 %
Neutrophils Absolute: 5.4 10*3/uL (ref 1.4–7.0)
Neutrophils: 57 %
Platelets: 529 10*3/uL — ABNORMAL HIGH (ref 150–379)
RBC: 3.42 x10E6/uL — ABNORMAL LOW (ref 4.14–5.80)
RDW: 15.7 % — ABNORMAL HIGH (ref 12.3–15.4)
WBC: 9.5 10*3/uL (ref 3.4–10.8)

## 2017-04-24 ENCOUNTER — Telehealth: Payer: Self-pay | Admitting: Family Medicine

## 2017-04-24 MED ORDER — PREDNISONE 10 MG PO TABS: 10.0000 mg | ORAL_TABLET | Freq: Every day | ORAL | 0 refills | Status: DC

## 2017-04-24 NOTE — Telephone Encounter (Signed)
Called patient to check in and see how he is doing. He is still having multiple joint pains and states they haven't changed since I saw him last. There are concerns for possible polymyalgia rheumatica based over elevated ESR and CRP. Discussed this with patient. Suggested starting a low dose Prednisone of 10 mg daily and seeing if that helps him. He was agreeable to that. Asked patient to follow up with me after his CT chest, echo, and appt with his nephrologist so we can go over his results as well as see how his symptoms are doing. This should be in about 3 weeks. He was agreeable and appreciative of the call.   Smitty Cords, MD [place], PGY-3

## 2017-04-25 DIAGNOSIS — N200 Calculus of kidney: Secondary | ICD-10-CM | POA: Diagnosis not present

## 2017-04-25 DIAGNOSIS — C641 Malignant neoplasm of right kidney, except renal pelvis: Secondary | ICD-10-CM | POA: Diagnosis not present

## 2017-04-25 DIAGNOSIS — K5730 Diverticulosis of large intestine without perforation or abscess without bleeding: Secondary | ICD-10-CM | POA: Diagnosis not present

## 2017-04-25 LAB — ANTIPHOSPHOLIPID SYNDROME COMP
APTT: 29.2 s
Anticardiolipin Ab, IgA: <10 [APL'U]
Anticardiolipin Ab, IgG: <10 [GPL'U]
Anticardiolipin Ab, IgM: 62 [MPL'U] — ABNORMAL HIGH
Antiphosphatidylserine IgG: 10 {GPS'U}
Antiphosphatidylserine IgM: >80 {MPS'U} — ABNORMAL HIGH
Antiprothrombin Antibody, IgG: 0 G units
Beta-2 Glycoprotein I, IgA: <10 SAU
Beta-2 Glycoprotein I, IgG: <10 SGU
Beta-2 Glycoprotein I, IgM: 27 SMU
DRVVT Screen Seconds: 38.4 s
Hexagonal Phospholipid Neutral: 9 s
Platelet Neutralization: 1.4 s

## 2017-04-26 ENCOUNTER — Ambulatory Visit (HOSPITAL_COMMUNITY)
Admission: RE | Admit: 2017-04-26 | Discharge: 2017-04-26 | Disposition: A | Discharge status: Home / Self Care | Payer: Medicare Other | Source: Ambulatory Visit | Attending: Family Medicine | Admitting: Family Medicine

## 2017-04-26 DIAGNOSIS — R911 Solitary pulmonary nodule: Secondary | ICD-10-CM | POA: Diagnosis not present

## 2017-04-26 DIAGNOSIS — R918 Other nonspecific abnormal finding of lung field: Secondary | ICD-10-CM | POA: Diagnosis not present

## 2017-04-26 DIAGNOSIS — I2510 Atherosclerotic heart disease of native coronary artery without angina pectoris: Secondary | ICD-10-CM | POA: Insufficient documentation

## 2017-04-26 DIAGNOSIS — I700 Atherosclerosis of aorta: Secondary | ICD-10-CM | POA: Insufficient documentation

## 2017-04-26 DIAGNOSIS — I712 Thoracic aortic aneurysm, without rupture: Secondary | ICD-10-CM | POA: Diagnosis not present

## 2017-04-27 ENCOUNTER — Ambulatory Visit (HOSPITAL_COMMUNITY)
Admission: RE | Admit: 2017-04-27 | Discharge: 2017-04-27 | Disposition: A | Discharge status: Home / Self Care | Payer: Medicare Other | Source: Ambulatory Visit | Attending: Family Medicine | Admitting: Family Medicine

## 2017-04-27 ENCOUNTER — Telehealth: Payer: Self-pay | Admitting: Family Medicine

## 2017-04-27 DIAGNOSIS — I340 Nonrheumatic mitral (valve) insufficiency: Secondary | ICD-10-CM | POA: Diagnosis not present

## 2017-04-27 DIAGNOSIS — M7989 Other specified soft tissue disorders: Secondary | ICD-10-CM

## 2017-04-27 DIAGNOSIS — R5383 Other fatigue: Secondary | ICD-10-CM

## 2017-04-27 DIAGNOSIS — R9431 Abnormal electrocardiogram [ECG] [EKG]: Secondary | ICD-10-CM | POA: Diagnosis not present

## 2017-04-27 DIAGNOSIS — I700 Atherosclerosis of aorta: Secondary | ICD-10-CM | POA: Diagnosis not present

## 2017-04-27 NOTE — Progress Notes (Signed)
  Echocardiogram 2D Echocardiogram has been performed.  Brian Leon 04/27/2017, 1:40 PM

## 2017-04-27 NOTE — Telephone Encounter (Signed)
Called patient to discuss CT scan of chest and echo. He was appreciative of the call.

## 2017-05-10 DIAGNOSIS — D171 Benign lipomatous neoplasm of skin and subcutaneous tissue of trunk: Secondary | ICD-10-CM | POA: Diagnosis not present

## 2017-05-18 ENCOUNTER — Ambulatory Visit (INDEPENDENT_AMBULATORY_CARE_PROVIDER_SITE_OTHER): Payer: Medicare Other | Admitting: Family Medicine

## 2017-05-18 ENCOUNTER — Encounter: Payer: Self-pay | Admitting: Family Medicine

## 2017-05-18 VITALS — BP 124/58 | HR 81 | Temp 98.7°F | Ht 70.0 in | Wt 148.4 lb

## 2017-05-18 DIAGNOSIS — M353 Polymyalgia rheumatica: Secondary | ICD-10-CM

## 2017-05-18 DIAGNOSIS — R911 Solitary pulmonary nodule: Secondary | ICD-10-CM | POA: Diagnosis not present

## 2017-05-18 DIAGNOSIS — D509 Iron deficiency anemia, unspecified: Secondary | ICD-10-CM | POA: Diagnosis not present

## 2017-05-18 DIAGNOSIS — I712 Thoracic aortic aneurysm, without rupture, unspecified: Secondary | ICD-10-CM

## 2017-05-18 MED ORDER — FERROUS SULFATE 325 (65 FE) MG PO TABS: 325.0000 mg | ORAL_TABLET | Freq: Every day | ORAL | 2 refills | Status: DC

## 2017-05-18 MED ORDER — PREDNISONE 10 MG PO TABS: 10.0000 mg | ORAL_TABLET | Freq: Every day | ORAL | 3 refills | Status: DC

## 2017-05-18 NOTE — Patient Instructions (Signed)
Polymyalgia Rheumatica Polymyalgia rheumatica (PMR) is an inflammatory disorder that causes aching and stiffness in your muscles and joints. Sometimes, PMR leads to a more dangerous condition (temporal arteritis or giant cell arteritis), which can cause vision loss. What are the causes? The exact cause of PMR is not known. What increases the risk? This condition is more likely to develop in:  Females.  People who are 73 years of age or older.  Caucasians.  What are the signs or symptoms?  Pain and stiffness are the main symptoms of PMR. Symptoms may start slowly or suddenly. The symptoms:  May be worse after inactivity and in the morning.  May affect your: ? Hips, buttocks, and thighs. ? Neck, arms, and shoulders. This can make it hard to raise your arms above your head. ? Hands and wrists.  Other symptoms include:  Fever.  Tiredness.  Weakness.  Decreased appetite. This may lead to weight loss.  How is this diagnosed? This condition is diagnosed with a medical history and physical exam. You may need to see a health care provider who specializes in diseases of the joint, muscles, and bones (rheumatologist). You may also have tests, including:  Blood tests.  X-rays.  How is this treated? PMR usually goes away without treatment, but it may take years for that to happen. In the meantime, your health care provider may recommend low-dose steroids to help manage your symptoms of pain and stiffness. Regular exercise and rest will also help your symptoms. Follow these instructions at home:  Take over-the-counter and prescription medicines only as told by your health care provider.  Make sure to get enough rest and sleep.  Eat a healthy and nutritious diet.  Try to exercise most days of the week. Ask your health care provider what type of exercise is best for you.  Keep all follow-up visits as told by your health are provider. This is important. Contact a health care  provider if:  Your symptoms are not controlled with medicine.  You have side effects from steroids. These may include: ? Weight gain. ? Swelling. ? Insomnia. ? Mood changes. ? Bruising. ? High blood sugar readings, if you have diabetes. ? Higher than normal blood pressure readings, if you monitor your blood pressure. Get help right away if:  You develop symptoms of temporal arteritis, such as: ? A change in vision. ? Severe headache. ? Scalp pain. ? Jaw pain. This information is not intended to replace advice given to you by your health care provider. Make sure you discuss any questions you have with your health care provider. Document Released: 10/27/2004 Document Revised: 02/25/2016 Document Reviewed: 04/01/2015 Elsevier Interactive Patient Education  2018 Elsevier Inc.  

## 2017-05-18 NOTE — Progress Notes (Signed)
Subjective:    Patient ID: Brian Leon , male   DOB: Oct 21, 1945 , 71 y.o..   MRN: [id number]  HPI  Brian Leon is a 71 yo male with PMH of history of psoriasis, cervical spine fusion, hypertension here for  Chief Complaint  Patient presents with  . go over test results    1. Follow up for polyarthralgia and fatigue: Possibly polymyalgia rheumatica. He was started on Prednisone a couple weeks ago and states it changed his life for the better. He notes that his symptoms are mostly resolved and he is back to his old self again. No fevers, chills, leg swelling, joint aches.   Review of Systems: Per HPI.   Past Medical History: Patient Active Problem List   Diagnosis Date Noted  . Polymyalgia rheumatica ([place]) 05/21/2017  . Thoracic aortic aneurysm ([place]) 05/21/2017  . Lung nodule 05/21/2017  . Polyarthralgia 04/12/2017  . Thrombocytosis ([place]) 04/12/2017  . Anemia 04/12/2017  . Fatigue 04/12/2017  . Acute right ankle pain 01/23/2017  . Herniated cervical disc without myelopathy 07/19/2016  . Cervical radiculopathy at C7 01/12/2015  . Constipation 12/30/2014  . Left arm pain 11/25/2014  . Lower extremity edema 11/25/2014  . Renal mass 10/08/2014  . Breast mass in male 08/21/2014  . Essential hypertension, benign 02/19/2014  . Healthcare maintenance 02/19/2014  . Ureteral stone with hydronephrosis 11/26/2013  . Inguinal hernia 12/13/2010  . VITAMIN D DEFICIENCY 06/11/2009  . PSORIASIS 06/11/2009  . CARPAL TUNNEL SYNDROME, RIGHT 06/17/2008  . BENIGN PROSTATIC HYPERTROPHY, HX OF 06/17/2008  . SPONDYLOSIS, CERVICAL 01/31/2008  . OTHER OSTEOPOROSIS 08/06/2007  . RHINITIS, ALLERGIC 11/30/2006  . Asthma, chronic 11/30/2006  . OSTEOARTHRITIS OF SPINE, NOS 11/30/2006  . OSTEOPENIA 11/30/2006    Medications: reviewed   Social Hx:  reports that he quit smoking about 33 years ago. His smoking use included Cigarettes. He quit after 20.00 years of use. He has never used  smokeless tobacco.   Objective:   BP (!) 124/58   Pulse 81   Temp 98.7 F (37.1 C) (Oral)   Ht '5\' 10"'  (1.778 m)   Wt 148 lb 6.4 oz (67.3 kg)   SpO2 95%   BMI 21.29 kg/m  Physical Exam  Gen: NAD, alert, cooperative with exam, well-appearing Psych: good insight, normal mood and affect  Assessment & Plan:  Polymyalgia rheumatica (HCC) Previous elevated CRP and ESR in June when shoulder, hip, and knee pain with LE edema and fatigue were present. All symptoms resolved with prednisone 10 mg daily suggests that he has PMR. Other autoimmune work up has been unrevealing thus far with the exception of an elevated Antiphosphatidylserine IgM >80 (unsure of the relevance of this lab). Discussed with patient the possibility of seeing a rheumatology specialist and he was interested in this. Referral to rheumatology placed. Will continue 10 mg of Prednisone daily for now as it is helping patient. May consider tapering down in the next 3 months or per rheumatology recommendations.   Anemia Iron deficiency anemia. Patient has been taking ferrous sulfate for ~1 month. Refilled this medication for him. Can consider rechecking iron next month.   Thoracic aortic aneurysm (HCC) Incidental finding on 7.25.18 chest CT showing 4.3 cm ascending thoracic aortic aneurysm. Needs follow up chest CT in 1 year  Lung nodule Incidental finding on 04/26/17 Chest CT. 6 mm left upper lobe nodule is noted. Non-contrast chest CT at 6-12 months is recommended.   Orders Placed This Encounter  Procedures  .  Ambulatory referral to Rheumatology    Referral Priority:   Routine    Referral Type:   Consultation    Referral Reason:   Specialty Services Required    Requested Specialty:   Rheumatology    Number of Visits Requested:   1   Meds ordered this encounter  Medications  . predniSONE (DELTASONE) 10 MG tablet    Sig: Take 1 tablet (10 mg total) by mouth daily with breakfast.    Dispense:  30 tablet    Refill:  3  .  ferrous sulfate 325 (65 FE) MG tablet    Sig: Take 1 tablet (325 mg total) by mouth daily.    Dispense:  30 tablet    Refill:  2    Smitty Cords, MD [place], PGY-3

## 2017-05-21 DIAGNOSIS — I712 Thoracic aortic aneurysm, without rupture, unspecified: Secondary | ICD-10-CM

## 2017-05-21 DIAGNOSIS — M353 Polymyalgia rheumatica: Secondary | ICD-10-CM | POA: Insufficient documentation

## 2017-05-21 DIAGNOSIS — R911 Solitary pulmonary nodule: Secondary | ICD-10-CM | POA: Insufficient documentation

## 2017-05-21 HISTORY — DX: Thoracic aortic aneurysm, without rupture, unspecified: I71.20

## 2017-05-21 HISTORY — DX: Polymyalgia rheumatica: M35.3

## 2017-05-21 HISTORY — DX: Solitary pulmonary nodule: R91.1

## 2017-05-21 HISTORY — DX: Thoracic aortic aneurysm, without rupture: I71.2

## 2017-05-21 NOTE — Assessment & Plan Note (Signed)
Incidental finding on 04/26/17 Chest CT. 6 mm left upper lobe nodule is noted. Non-contrast chest CT at 6-12 months is recommended.

## 2017-05-21 NOTE — Assessment & Plan Note (Signed)
Iron deficiency anemia. Patient has been taking ferrous sulfate for ~1 month. Refilled this medication for him. Can consider rechecking iron next month.

## 2017-05-21 NOTE — Assessment & Plan Note (Signed)
Previous elevated CRP and ESR in June when shoulder, hip, and knee pain with LE edema and fatigue were present. All symptoms resolved with prednisone 10 mg daily suggests that he has PMR. Other autoimmune work up has been unrevealing thus far with the exception of an elevated Antiphosphatidylserine IgM >80 (unsure of the relevance of this lab). Discussed with patient the possibility of seeing a rheumatology specialist and he was interested in this. Referral to rheumatology placed. Will continue 10 mg of Prednisone daily for now as it is helping patient. May consider tapering down in the next 3 months or per rheumatology recommendations.

## 2017-05-21 NOTE — Assessment & Plan Note (Signed)
Incidental finding on 7.25.18 chest CT showing 4.3 cm ascending thoracic aortic aneurysm. Needs follow up chest CT in 1 year

## 2017-05-25 DIAGNOSIS — Z23 Encounter for immunization: Secondary | ICD-10-CM | POA: Diagnosis not present

## 2017-07-06 DIAGNOSIS — N5201 Erectile dysfunction due to arterial insufficiency: Secondary | ICD-10-CM | POA: Diagnosis not present

## 2017-07-06 DIAGNOSIS — N200 Calculus of kidney: Secondary | ICD-10-CM | POA: Diagnosis not present

## 2017-07-06 DIAGNOSIS — Q646 Congenital diverticulum of bladder: Secondary | ICD-10-CM | POA: Diagnosis not present

## 2017-07-06 DIAGNOSIS — C641 Malignant neoplasm of right kidney, except renal pelvis: Secondary | ICD-10-CM | POA: Diagnosis not present

## 2017-07-06 DIAGNOSIS — N210 Calculus in bladder: Secondary | ICD-10-CM | POA: Diagnosis not present

## 2017-08-09 DIAGNOSIS — L280 Lichen simplex chronicus: Secondary | ICD-10-CM | POA: Diagnosis not present

## 2017-08-09 DIAGNOSIS — L570 Actinic keratosis: Secondary | ICD-10-CM | POA: Diagnosis not present

## 2017-08-09 DIAGNOSIS — L2081 Atopic neurodermatitis: Secondary | ICD-10-CM | POA: Diagnosis not present

## 2017-08-09 DIAGNOSIS — Z79899 Other long term (current) drug therapy: Secondary | ICD-10-CM | POA: Diagnosis not present

## 2017-08-09 DIAGNOSIS — L281 Prurigo nodularis: Secondary | ICD-10-CM | POA: Diagnosis not present

## 2017-08-09 DIAGNOSIS — L209 Atopic dermatitis, unspecified: Secondary | ICD-10-CM | POA: Diagnosis not present

## 2017-08-17 ENCOUNTER — Other Ambulatory Visit: Payer: Self-pay | Admitting: Family Medicine

## 2017-08-18 ENCOUNTER — Telehealth: Payer: Self-pay | Admitting: Family Medicine

## 2017-08-18 NOTE — Telephone Encounter (Signed)
We will likely be able to do that for him. He will need to schedule an appointment to be seen first.   [facility] team please call patient and ask him to schedule an appt with [facility] at his earliest convenience. Thank you.   Smitty Cords, MD [place], PGY-3

## 2017-08-18 NOTE — Telephone Encounter (Signed)
Pt called and said the prednisone doesn't seem to be working very well anymore. He said some of the pains have started to come back. He wants to know if he could get a bigger dosage or take a bigger quantity. Please advise.

## 2017-08-21 ENCOUNTER — Other Ambulatory Visit: Payer: Self-pay | Admitting: Family Medicine

## 2017-08-23 NOTE — Telephone Encounter (Signed)
Tried to contact pt to inform him of this and phone only rang.  In reviewing pt appointments he has an upcoming appointment on 08/29/2017 with Dr. Juanito Doom.  Just remind him to be sure to mention this about his prednisone at that visit as well so that they can figure out what they need to do for this medication. Katharina Caper, April D, [place]

## 2017-08-29 ENCOUNTER — Ambulatory Visit (INDEPENDENT_AMBULATORY_CARE_PROVIDER_SITE_OTHER): Payer: Medicare Other | Admitting: Family Medicine

## 2017-08-29 ENCOUNTER — Other Ambulatory Visit: Payer: Self-pay

## 2017-08-29 ENCOUNTER — Encounter: Payer: Self-pay | Admitting: Family Medicine

## 2017-08-29 VITALS — BP 132/78 | HR 79 | Temp 98.8°F | Ht 70.0 in | Wt 150.8 lb

## 2017-08-29 DIAGNOSIS — Z1211 Encounter for screening for malignant neoplasm of colon: Secondary | ICD-10-CM

## 2017-08-29 DIAGNOSIS — D509 Iron deficiency anemia, unspecified: Secondary | ICD-10-CM | POA: Insufficient documentation

## 2017-08-29 DIAGNOSIS — M353 Polymyalgia rheumatica: Secondary | ICD-10-CM

## 2017-08-29 MED ORDER — PREDNISONE 10 MG PO TABS: 10.0000 mg | ORAL_TABLET | Freq: Every day | ORAL | 3 refills | Status: DC

## 2017-08-29 MED ORDER — ESOMEPRAZOLE MAGNESIUM 20 MG PO PACK: 20.0000 mg | PACK | Freq: Every day | ORAL | 6 refills | Status: DC

## 2017-08-29 NOTE — Assessment & Plan Note (Signed)
Not taking ferrous sulfate as prescribed.  Does endorse some intermittent numbness tingling of fingers and toes.  No hematochezia and stools.  He is due for his colonoscopy. -Continue ferrous sulfate as prescribed -Recheck anemia panel today -Referral for GI for colonoscopy placed

## 2017-08-29 NOTE — Assessment & Plan Note (Addendum)
Symptoms mostly controlled with prednisone 10 mg daily which was started around late July 2018.  For the last couple weeks has had some slight joint pain in the shoulders and neck.  Discussed risks and benefits of increasing prednisone dose versus trying NSAIDs or Tylenol.  Patient would like to try NSAIDs as needed for pain instead of go up on prednisone. -Continue prednisone 10 mg daily -Start PPI for gastric protection while on chronic steroids -Ibuprofen as needed, he will schedule an appoint with me if he is needing to take ibuprofen daily -Establish care with rheumatology on November 17, 2016 -Follow-up with me in 3 months after seeing rheumatology

## 2017-08-29 NOTE — Patient Instructions (Signed)
Thank you for coming in today, it was so nice to see you! Today we talked about:    We will keep you on the same dose of Prednisone for now, 10 mg daily. If you find that you are needing lots of ibuprofen daily please let me know. You can take up to 800 mg of Ibuprofen twice a day if needed for pain  We are going to recheck your iron, your blood count, and your electrolytes today  Try to take a multivitamin with B12 daily. Take iron as prescribed  I have placed a referral for the GI doctor for a colonoscopy, someone will call you and schedule that.   Please follow up with me in 3 months.   If we ordered any tests today, you will be notified via telephone of any abnormalities. If everything is normal you will get a letter in the mail.   If you have any questions or concerns, please do not hesitate to call the office at [phone]. You can also message me directly via MyChart.   Sincerely,  Smitty Cords, MD

## 2017-08-29 NOTE — Progress Notes (Addendum)
Subjective:    Patient ID: Brian Leon , male   DOB: 12-26-1945 , 71 y.o..   MRN: [id number]  HPI  Brian Leon is a 71 yo male with PMH of history of psoriasis, cervical spine fusion, hypertensionhere for  Chief Complaint  Patient presents with  . Polymyalgia rheumatica    1.  Polymyalgia rheumatica: Patient is here for follow-up today for his polymyalgia rheumatica.  He was last seen in August.  He has been taking 10 mg of prednisone daily which she states initially helped his joint pains for a couple months but then his pain started to return around 2 weeks ago slightly.  Patient notes that his pain is mostly in his neck and shoulders.  He has been taking 600 mg of Ibuprofen about 3 times a week which provides little relief.  He has been referred to rheumatology has an appointment on November 17, 2016.  Patient also endorses some slight numbness and tingling in his fingers and toes which is intermittent in nature.  He notes that this is been going on for a year or so now and he thought it was normal.  2.  Iron deficiency anemia: Patient notes that he has not been faithfully taking his iron supplementation as it caused him some constipation.  He denies any dizziness, lightheadedness, headaches.  Denies any hematochezia.  Review of Systems: Per HPI.   Past Medical History: Patient Active Problem List   Diagnosis Date Noted  . Iron deficiency anemia 08/29/2017  . Polymyalgia rheumatica ([place]) 05/21/2017  . Thoracic aortic aneurysm ([place]) 05/21/2017  . Lung nodule 05/21/2017  . Polyarthralgia 04/12/2017  . Thrombocytosis ([place]) 04/12/2017  . Anemia 04/12/2017  . Fatigue 04/12/2017  . Acute right ankle pain 01/23/2017  . Herniated cervical disc without myelopathy 07/19/2016  . Cervical radiculopathy at C7 01/12/2015  . Constipation 12/30/2014  . Left arm pain 11/25/2014  . Lower extremity edema 11/25/2014  . Renal mass 10/08/2014  . Breast mass in male 08/21/2014  . Healthcare  maintenance 02/19/2014  . Ureteral stone with hydronephrosis 11/26/2013  . Inguinal hernia 12/13/2010  . VITAMIN D DEFICIENCY 06/11/2009  . PSORIASIS 06/11/2009  . CARPAL TUNNEL SYNDROME, RIGHT 06/17/2008  . BENIGN PROSTATIC HYPERTROPHY, HX OF 06/17/2008  . SPONDYLOSIS, CERVICAL 01/31/2008  . OTHER OSTEOPOROSIS 08/06/2007  . RHINITIS, ALLERGIC 11/30/2006  . Asthma, chronic 11/30/2006  . OSTEOARTHRITIS OF SPINE, NOS 11/30/2006  . OSTEOPENIA 11/30/2006    Medications: reviewed   Social Hx:  reports that he quit smoking about 33 years ago. His smoking use included cigarettes. He quit after 20.00 years of use. he has never used smokeless tobacco.   Objective:   BP 132/78   Pulse 79   Temp 98.8 F (37.1 C) (Oral)   Ht 5\' 10"  (1.778 m)   Wt 150 lb 12.8 oz (68.4 kg)   SpO2 96%   BMI 21.64 kg/m  Physical Exam  Gen: NAD, alert, cooperative with exam, well-appearing Psych: good insight, normal mood and affect  Assessment & Plan:  Polymyalgia rheumatica ([place]) Symptoms mostly controlled with prednisone 10 mg daily which was started around late July 2018.  For the last couple weeks has had some slight joint pain in the shoulders and neck.  Discussed risks and benefits of increasing prednisone dose versus trying NSAIDs or Tylenol.  Patient would like to try NSAIDs as needed for pain instead of go up on prednisone. -Continue prednisone 10 mg daily -Start PPI for gastric protection while  on chronic steroids -Ibuprofen as needed, he will schedule an appoint with me if he is needing to take ibuprofen daily -Establish care with rheumatology on November 17, 2016 -Follow-up with me in 3 months after seeing rheumatology  Iron deficiency anemia Not taking ferrous sulfate as prescribed.  Does endorse some intermittent numbness tingling of fingers and toes.  No hematochezia and stools.  He is due for his colonoscopy. -Continue ferrous sulfate as prescribed -Recheck anemia panel today -Referral  for GI for colonoscopy placed  Orders Placed This Encounter  Procedures  . CBC with Differential  . Anemia panel  . Basic Metabolic Panel  . Ambulatory referral to Gastroenterology    Referral Priority:   Routine    Referral Type:   Consultation    Referral Reason:   Specialty Services Required    Number of Visits Requested:   1   Meds ordered this encounter  Medications  . predniSONE (DELTASONE) 10 MG tablet    Sig: Take 1 tablet (10 mg total) by mouth daily with breakfast.    Dispense:  30 tablet    Refill:  3  . esomeprazole (NEXIUM) 20 MG packet    Sig: Take 20 mg by mouth daily before breakfast.    Dispense:  30 each    Refill:  6    Smitty Cords, MD [place], PGY-3

## 2017-08-30 ENCOUNTER — Other Ambulatory Visit: Payer: Self-pay | Admitting: Family Medicine

## 2017-08-30 ENCOUNTER — Encounter: Payer: Self-pay | Admitting: Family Medicine

## 2017-08-30 LAB — BASIC METABOLIC PANEL
BUN/Creatinine Ratio: 19 (ref 10–24)
BUN: 19 mg/dL (ref 8–27)
CO2: 21 mmol/L (ref 20–29)
Calcium: 9.2 mg/dL (ref 8.6–10.2)
Chloride: 106 mmol/L (ref 96–106)
Creatinine, Ser: 1.02 mg/dL (ref 0.76–1.27)
GFR calc Af Amer: 84 mL/min/{1.73_m2} (ref >59)
GFR calc non Af Amer: 73 mL/min/{1.73_m2} (ref >59)
Glucose: 102 mg/dL — ABNORMAL HIGH (ref 65–99)
Potassium: 4 mmol/L (ref 3.5–5.2)
Sodium: 142 mmol/L (ref 134–144)

## 2017-08-30 LAB — CBC WITH DIFFERENTIAL/PLATELET
Basophils Absolute: 0 10*3/uL (ref 0.0–0.2)
Basos: 0 %
EOS (ABSOLUTE): 0.1 10*3/uL (ref 0.0–0.4)
Eos: 1 %
Hemoglobin: 12.5 g/dL — ABNORMAL LOW (ref 13.0–17.7)
Immature Grans (Abs): 0.1 10*3/uL (ref 0.0–0.1)
Immature Granulocytes: 1 %
Lymphocytes Absolute: 1.3 10*3/uL (ref 0.7–3.1)
Lymphs: 11 %
MCH: 31.9 pg (ref 26.6–33.0)
MCHC: 33.1 g/dL (ref 31.5–35.7)
MCV: 96 fL (ref 79–97)
Monocytes Absolute: 0.8 10*3/uL (ref 0.1–0.9)
Monocytes: 7 %
Neutrophils Absolute: 9.4 10*3/uL — ABNORMAL HIGH (ref 1.4–7.0)
Neutrophils: 80 %
Platelets: 361 10*3/uL (ref 150–379)
RBC: 3.92 x10E6/uL — ABNORMAL LOW (ref 4.14–5.80)
RDW: 14.3 % (ref 12.3–15.4)
WBC: 11.7 10*3/uL — ABNORMAL HIGH (ref 3.4–10.8)

## 2017-08-30 LAB — ANEMIA PANEL
Ferritin: 81 ng/mL (ref 30–400)
Folate, Hemolysate: 384 ng/mL
Folate, RBC: 1016 ng/mL (ref >498)
Hematocrit: 37.8 % (ref 37.5–51.0)
Iron Saturation: 37 % (ref 15–55)
Iron: 94 ug/dL (ref 38–169)
Retic Ct Pct: 1.2 % (ref 0.6–2.6)
Total Iron Binding Capacity: 257 ug/dL (ref 250–450)
UIBC: 163 ug/dL (ref 111–343)
Vitamin B-12: 247 pg/mL (ref 232–1245)

## 2017-08-30 MED ORDER — PANTOPRAZOLE SODIUM 20 MG PO TBEC: 20.0000 mg | DELAYED_RELEASE_TABLET | Freq: Every day | ORAL | 3 refills | Status: DC

## 2017-10-08 NOTE — Progress Notes (Signed)
   Subjective:    Patient ID: Brian Leon , male   DOB: June 28, 1946 , 72 y.o..   MRN: [id number]  HPI  Brian Leon is here for  Chief Complaint  Patient presents with  . Shoulder Weakness    leg and back    1.  Polymyalgia rheumatica; patient notes that over the last week his symptoms have been returning.  This includes insidious pain in his back, knees, right ankle, and shoulders.  The shoulders are where he is experiencing most of his pain and it is worse with movement.  He has tried taking 800 mg ibuprofen tablets which did not provide much relief.  He notes that he has had decreased grip strength as well.  He has had some trouble going up and down the stairs at times.  Denies any falls.  He endorses that he is more tired but sleeping the same as before.  His rheumatologist rescheduled his appointment to May 2019  Review of Systems: Per HPI.   Medications: reviewed   Social Hx:  reports that he quit smoking about 33 years ago. His smoking use included cigarettes. He quit after 20.00 years of use. he has never used smokeless tobacco.   Objective:   BP 136/72   Pulse 67   Temp 99 F (37.2 C) (Oral)   Ht 5\' 10"  (1.778 m)   Wt 148 lb 6.4 oz (67.3 kg)   SpO2 98%   BMI 21.29 kg/m  Physical Exam  Gen: NAD, alert, cooperative with exam, well-appearing MSK: Range of motion intact throughout, 4-5 grip strength bilaterally but otherwise 5 out of 5 strength in bilateral upper upper extremities, 4-5 hip flexion of right hip but otherwise 5 out of 5 strength in bilateral lower extremities.  No edema Neurological: no gross deficits.  Sensation grossly intact.  Normal gait.  2+ reflexes throughout Psych: good insight, normal mood and affect  Assessment & Plan:  Polymyalgia rheumatica (HCC) Symptoms returning.  Was supposed to see rheumatology in February but appointment was pushed back to the summer - Increase prednisone to 20 mg daily -Continue daily PPI for gastric protection while  on chronic steroid -Ibuprofen as needed - Will place second referral to rheumatology in the event that there is another rheumatologist I can see him sooner than this summer, this was per patient request -Follow-up in 2 weeks if no improvement otherwise we will see in 3 months  Orders Placed This Encounter  Procedures  . Pneumococcal polysaccharide vaccine 23-valent greater than or equal to 2yo subcutaneous/IM  . Ambulatory referral to Rheumatology    Referral Priority:   Routine    Referral Type:   Consultation    Referral Reason:   Specialty Services Required    Requested Specialty:   Rheumatology    Number of Visits Requested:   1   Meds ordered this encounter  Medications  . predniSONE (DELTASONE) 10 MG tablet    Sig: Take 2 tablets (20 mg total) by mouth daily with breakfast.    Dispense:  60 tablet    Refill:  3  . Tdap (BOOSTRIX) 5-2.5-18.5 LF-MCG/0.5 injection    Sig: Inject 0.5 mLs into the muscle once for 1 dose.    Dispense:  0.5 mL    Refill:  0    Smitty Cords, MD [place], PGY-3

## 2017-10-09 ENCOUNTER — Ambulatory Visit (INDEPENDENT_AMBULATORY_CARE_PROVIDER_SITE_OTHER): Payer: Medicare Other | Admitting: Family Medicine

## 2017-10-09 ENCOUNTER — Encounter: Payer: Self-pay | Admitting: Family Medicine

## 2017-10-09 ENCOUNTER — Other Ambulatory Visit: Payer: Self-pay

## 2017-10-09 VITALS — BP 136/72 | HR 67 | Temp 99.0°F | Ht 70.0 in | Wt 148.4 lb

## 2017-10-09 DIAGNOSIS — M353 Polymyalgia rheumatica: Secondary | ICD-10-CM

## 2017-10-09 DIAGNOSIS — M069 Rheumatoid arthritis, unspecified: Secondary | ICD-10-CM

## 2017-10-09 DIAGNOSIS — Z23 Encounter for immunization: Secondary | ICD-10-CM

## 2017-10-09 MED ORDER — PREDNISONE 10 MG PO TABS: 20.0000 mg | ORAL_TABLET | Freq: Every day | ORAL | 3 refills | Status: DC

## 2017-10-09 MED ORDER — TETANUS-DIPHTH-ACELL PERTUSSIS 5-2.5-18.5 LF-MCG/0.5 IM SUSP: 0.5000 mL | Freq: Once | INTRAMUSCULAR | 0 refills | Status: AC

## 2017-10-09 NOTE — Patient Instructions (Signed)
Thank you for coming in today, it was so nice to see you! Today we talked about:    Joint pains and muscle weakness coming back: We have increased your Prednisone to 20 mg daily. Come back in 1 month if no improvement, if you find improvement with that you can see me in 3 months  I have placed another referral for rheumatology, we will see if there is someone else in the area who can see you sooner  You will need repeat CT Chest scan in July 2019 to follow up on a small lung nodule and your aorta diameter  You received a pneumonia vaccine today  Take the tetanus vaccine to any pharmacy to get this   If you have any questions or concerns, please do not hesitate to call the office at [phone]. You can also message me directly via MyChart.   Sincerely,  Smitty Cords, MD

## 2017-10-10 NOTE — Assessment & Plan Note (Signed)
Symptoms returning.  Was supposed to see rheumatology in February but appointment was pushed back to the summer - Increase prednisone to 20 mg daily -Continue daily PPI for gastric protection while on chronic steroid -Ibuprofen as needed - Will place second referral to rheumatology in the event that there is another rheumatologist I can see him sooner than this summer, this was per patient request -Follow-up in 2 weeks if no improvement otherwise we will see in 3 months

## 2017-11-10 ENCOUNTER — Ambulatory Visit: Payer: Self-pay | Admitting: Rheumatology

## 2017-11-15 DIAGNOSIS — Z85828 Personal history of other malignant neoplasm of skin: Secondary | ICD-10-CM | POA: Diagnosis not present

## 2017-11-15 DIAGNOSIS — L209 Atopic dermatitis, unspecified: Secondary | ICD-10-CM | POA: Diagnosis not present

## 2017-11-15 DIAGNOSIS — L570 Actinic keratosis: Secondary | ICD-10-CM | POA: Diagnosis not present

## 2017-11-15 DIAGNOSIS — Z79899 Other long term (current) drug therapy: Secondary | ICD-10-CM | POA: Diagnosis not present

## 2017-11-15 DIAGNOSIS — L281 Prurigo nodularis: Secondary | ICD-10-CM | POA: Diagnosis not present

## 2017-11-15 DIAGNOSIS — L2081 Atopic neurodermatitis: Secondary | ICD-10-CM | POA: Diagnosis not present

## 2017-11-17 ENCOUNTER — Ambulatory Visit: Payer: Self-pay | Admitting: Rheumatology

## 2017-11-27 DIAGNOSIS — Z7952 Long term (current) use of systemic steroids: Secondary | ICD-10-CM | POA: Insufficient documentation
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
## 2017-11-30 ENCOUNTER — Encounter: Payer: Self-pay | Admitting: Family Medicine

## 2017-11-30 ENCOUNTER — Other Ambulatory Visit: Payer: Self-pay

## 2017-11-30 ENCOUNTER — Ambulatory Visit (INDEPENDENT_AMBULATORY_CARE_PROVIDER_SITE_OTHER): Payer: Medicare Other | Admitting: Family Medicine

## 2017-11-30 VITALS — BP 128/64 | HR 92 | Temp 99.5°F | Ht 70.0 in | Wt 146.4 lb

## 2017-11-30 DIAGNOSIS — L0390 Cellulitis, unspecified: Secondary | ICD-10-CM

## 2017-11-30 MED ORDER — CEPHALEXIN 500 MG PO CAPS: 500.0000 mg | ORAL_CAPSULE | Freq: Two times a day (BID) | ORAL | 0 refills | Status: AC

## 2017-11-30 NOTE — Patient Instructions (Signed)
Thank you for coming in today, it was so nice to see you! Today we talked about:    The wounds on your arms may take longer than expected to heal because of the prednisone. Continue to try and protect your skin while working by wearing long sleeve shirts.   Please take the Keflex antibiotic 1 pill in the morning and 1 at night for the next 7 days  Please come back if you are having fevers, chills, the wounds look more red, inflamed, or painful  If you have any questions or concerns, please do not hesitate to call the office at [phone]. You can also message me directly via MyChart.   Sincerely,  Smitty Cords, MD

## 2017-11-30 NOTE — Progress Notes (Signed)
Subjective:    Patient ID: Brian Leon , male   DOB: [birth year] , 72 y.o..   MRN: [id number]  HPI  Brian Leon is here for  Chief Complaint  Patient presents with  . Wound Infection    1. Wound infection: Patient is being seen today for scratches on his arms that he thinks have gotten infected.  He notes that he has a large one on his right elbow that had some pus drainage yesterday.  Today there has been no drainage.  He notes that he was working on a project with wiring and when the wires scratched his skin it caused him to itch.  He denies any fevers chills, nausea, vomiting.  No change in appetite.   Review of Systems: Per HPI.   Past Medical History: Patient Active Problem List   Diagnosis Date Noted  . On prednisone therapy 11/27/2017  . Iron deficiency anemia 08/29/2017  . Polymyalgia rheumatica ([place]) 05/21/2017  . Thoracic aortic aneurysm ([place]) 05/21/2017  . Lung nodule 05/21/2017  . Polyarthralgia 04/12/2017  . Thrombocytosis ([place]) 04/12/2017  . Anemia 04/12/2017  . Fatigue 04/12/2017  . Acute right ankle pain 01/23/2017  . Osteoarthritis of spine with radiculopathy, 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  . Cervical radiculopathy at C7 01/12/2015  . Constipation 12/30/2014  . Left arm pain 11/25/2014  . Lower extremity edema 11/25/2014  . Renal mass 10/08/2014  . Breast mass in male 08/21/2014  . Healthcare maintenance 02/19/2014  . Ureteral stone with hydronephrosis 11/26/2013  . Inguinal hernia 12/13/2010  . Vitamin D deficiency 06/11/2009  . Psoriasis 06/11/2009  . CARPAL TUNNEL SYNDROME, RIGHT 06/17/2008  . BENIGN PROSTATIC HYPERTROPHY, HX OF 06/17/2008  . SPONDYLOSIS, CERVICAL 01/31/2008  . OTHER OSTEOPOROSIS 08/06/2007  . RHINITIS, ALLERGIC 11/30/2006  . Asthma, chronic 11/30/2006  . OSTEOARTHRITIS OF SPINE, NOS 11/30/2006  . OSTEOPENIA 11/30/2006    Medications: reviewed   Social Hx:  reports that he quit smoking about 33 years ago. His smoking use  included cigarettes. He quit after 20.00 years of use. he has never used smokeless tobacco.   Objective:   BP 128/64   Pulse 92   Temp 99.5 F (37.5 C) (Oral)   Ht 5\' 10"  (1.778 m)   Wt 146 lb 6.4 oz (66.4 kg)   SpO2 95%   BMI 21.01 kg/m  Physical Exam  Gen: NAD, alert, cooperative with exam, well-appearing Skin: Multiple areas of solar lentigo, 1cm wound on R elbow, no drainage, no surrounding erythema, induration or fluctuance.  Multiple other 2-57mm scratches on left arm that do not appear to have any drainage, erythema, induration or fluctuance Neurological: no gross deficits.  Psych: good insight, normal mood and affect      Assessment & Plan:   1. Cellulitis: Mild cellulitis on right elbow, no abscess.  Other areas of wounds on arms that patient is concerned about do not appear infected.  Discussed that it is expected with chronic prednisone use to have poor wound healing when he injures his skin.  Patient expressed good understanding. -Keflex 500 mg twice daily for 7 days - Discussed washing the wounds with soap and water daily -Return precautions discussed    Meds ordered this encounter  Medications  . cephALEXin (KEFLEX) 500 MG capsule    Sig: Take 1 capsule (500 mg total) by mouth 2 (two) times daily for 7 days.    Dispense:  14 capsule    Refill:  0    Smitty Cords, MD  [place], PGY-3

## 2017-12-06 ENCOUNTER — Ambulatory Visit (INDEPENDENT_AMBULATORY_CARE_PROVIDER_SITE_OTHER): Payer: Medicare Other | Admitting: Rheumatology

## 2017-12-06 ENCOUNTER — Encounter: Payer: Self-pay | Admitting: Rheumatology

## 2017-12-06 VITALS — BP 164/89 | HR 70 | Resp 17 | Ht 70.0 in | Wt 149.0 lb

## 2017-12-06 DIAGNOSIS — Z9889 Other specified postprocedural states: Secondary | ICD-10-CM

## 2017-12-06 DIAGNOSIS — M5030 Other cervical disc degeneration, unspecified cervical region: Secondary | ICD-10-CM | POA: Diagnosis not present

## 2017-12-06 DIAGNOSIS — Z970 Presence of artificial eye: Secondary | ICD-10-CM

## 2017-12-06 DIAGNOSIS — Z7952 Long term (current) use of systemic steroids: Secondary | ICD-10-CM

## 2017-12-06 DIAGNOSIS — Z872 Personal history of diseases of the skin and subcutaneous tissue: Secondary | ICD-10-CM | POA: Diagnosis not present

## 2017-12-06 DIAGNOSIS — M353 Polymyalgia rheumatica: Secondary | ICD-10-CM | POA: Diagnosis not present

## 2017-12-06 DIAGNOSIS — E559 Vitamin D deficiency, unspecified: Secondary | ICD-10-CM | POA: Diagnosis not present

## 2017-12-06 DIAGNOSIS — Z8679 Personal history of other diseases of the circulatory system: Secondary | ICD-10-CM

## 2017-12-06 DIAGNOSIS — I712 Thoracic aortic aneurysm, without rupture, unspecified: Secondary | ICD-10-CM

## 2017-12-06 DIAGNOSIS — Z79899 Other long term (current) drug therapy: Secondary | ICD-10-CM | POA: Diagnosis not present

## 2017-12-06 DIAGNOSIS — R911 Solitary pulmonary nodule: Secondary | ICD-10-CM

## 2017-12-06 DIAGNOSIS — D473 Essential (hemorrhagic) thrombocythemia: Secondary | ICD-10-CM

## 2017-12-06 DIAGNOSIS — R899 Unspecified abnormal finding in specimens from other organs, systems and tissues: Secondary | ICD-10-CM | POA: Diagnosis not present

## 2017-12-06 DIAGNOSIS — Z8719 Personal history of other diseases of the digestive system: Secondary | ICD-10-CM

## 2017-12-06 DIAGNOSIS — Z87442 Personal history of urinary calculi: Secondary | ICD-10-CM | POA: Diagnosis not present

## 2017-12-06 DIAGNOSIS — Z862 Personal history of diseases of the blood and blood-forming organs and certain disorders involving the immune mechanism: Secondary | ICD-10-CM

## 2017-12-06 DIAGNOSIS — D75839 Thrombocytosis, unspecified: Secondary | ICD-10-CM

## 2017-12-06 HISTORY — DX: Presence of artificial eye: Z97.0

## 2017-12-08 ENCOUNTER — Ambulatory Visit: Payer: Self-pay | Admitting: Rheumatology

## 2017-12-08 NOTE — Progress Notes (Signed)
Please call in vitamin D 50,000 units twice a week for 3 months.  Recheck labs in 3 months.  I will discuss rest of the labs at follow-up visit.

## 2017-12-11 ENCOUNTER — Telehealth: Payer: Self-pay | Admitting: *Deleted

## 2017-12-11 DIAGNOSIS — E559 Vitamin D deficiency, unspecified: Secondary | ICD-10-CM

## 2017-12-11 MED ORDER — VITAMIN D (ERGOCALCIFEROL) 1.25 MG (50000 UNIT) PO CAPS: 50000.0000 [IU] | ORAL_CAPSULE | ORAL | 0 refills | Status: DC

## 2017-12-11 NOTE — Telephone Encounter (Signed)
-----   Message from Bo Merino, MD sent at 12/08/2017  3:06 PM EST ----- Please call in vitamin D 50,000 units twice a week for 3 months.  Recheck labs in 3 months.  I will discuss rest of the labs at follow-up visit.

## 2017-12-12 LAB — LUPUS ANTICOAGULANT EVAL W/ REFLEX
PTT LA SCREEN: 35 s (ref <=40)
dRVVT Screen: 37 s (ref <=45)

## 2017-12-12 LAB — IGG, IGA, IGM
IgG (Immunoglobin G), Serum: 706 mg/dL (ref 694–1618)
IgM, Serum: 217 mg/dL (ref 48–271)
Immunoglobulin A: 109 mg/dL (ref 81–463)

## 2017-12-12 LAB — QUANTIFERON-TB GOLD PLUS
Mitogen-NIL: >10 IU/mL
NIL: 0.03 IU/mL
QuantiFERON-TB Gold Plus: NEGATIVE
TB1-NIL: 0 IU/mL
TB2-NIL: <0 IU/mL

## 2017-12-12 LAB — BASIC METABOLIC PANEL WITH GFR
BUN/Creatinine Ratio: 15 (calc) (ref 6–22)
BUN: 18 mg/dL (ref 7–25)
CO2: 27 mmol/L (ref 20–32)
Calcium: 9.3 mg/dL (ref 8.6–10.3)
Chloride: 107 mmol/L (ref 98–110)
Creat: 1.2 mg/dL — ABNORMAL HIGH (ref 0.70–1.18)
GFR, Est African American: 69 mL/min/{1.73_m2} (ref >=60)
GFR, Est Non African American: 60 mL/min/{1.73_m2} (ref >=60)
Glucose, Bld: 88 mg/dL (ref 65–99)
Potassium: 4.1 mmol/L (ref 3.5–5.3)
Sodium: 141 mmol/L (ref 135–146)

## 2017-12-12 LAB — SJOGRENS SYNDROME-A EXTRACTABLE NUCLEAR ANTIBODY: SSA (Ro) (ENA) Antibody, IgG: <1 AI

## 2017-12-12 LAB — CBC WITH DIFFERENTIAL/PLATELET
Basophils Absolute: 32 cells/uL (ref 0–200)
Basophils Relative: 0.3 %
Eosinophils Absolute: 119 cells/uL (ref 15–500)
Eosinophils Relative: 1.1 %
HCT: 35.4 % — ABNORMAL LOW (ref 38.5–50.0)
Hemoglobin: 12.2 g/dL — ABNORMAL LOW (ref 13.2–17.1)
Lymphs Abs: 1879 cells/uL (ref 850–3900)
MCH: 33.3 pg — ABNORMAL HIGH (ref 27.0–33.0)
MCHC: 34.5 g/dL (ref 32.0–36.0)
MCV: 96.7 fL (ref 80.0–100.0)
MPV: 9.3 fL (ref 7.5–12.5)
Monocytes Relative: 8.7 %
Neutro Abs: 7830 cells/uL — ABNORMAL HIGH (ref 1500–7800)
Neutrophils Relative %: 72.5 %
Platelets: 345 10*3/uL (ref 140–400)
RBC: 3.66 10*6/uL — ABNORMAL LOW (ref 4.20–5.80)
RDW: 12.3 % (ref 11.0–15.0)
Total Lymphocyte: 17.4 %
WBC mixed population: 940 cells/uL (ref 200–950)
WBC: 10.8 10*3/uL (ref 3.8–10.8)

## 2017-12-12 LAB — HIV ANTIBODY (ROUTINE TESTING W REFLEX): HIV 1&2 Ab, 4th Generation: NONREACTIVE

## 2017-12-12 LAB — PHOSPHATIDYLSERINE IGG, IGA, IGM
PHOSPHATIDYLSERINE AB (IGA): <20 U/mL (ref <20)
Phosphatidylserine (IgG): <10 U/mL (ref <10)
Phosphatidylserine (IgM): 51 U/mL — ABNORMAL HIGH (ref <25)

## 2017-12-12 LAB — CARDIOLIPIN ANTIBODIES, IGG, IGM, IGA
Anticardiolipin IgA: <11 [APL'U]
Anticardiolipin IgG: <14 [GPL'U]
Anticardiolipin IgM: 47 [MPL'U] — ABNORMAL HIGH

## 2017-12-12 LAB — ANTI-DNA ANTIBODY, DOUBLE-STRANDED: ds DNA Ab: <1 IU/mL

## 2017-12-12 LAB — HEPATITIS B CORE ANTIBODY, IGM: Hep B C IgM: NONREACTIVE

## 2017-12-12 LAB — SJOGRENS SYNDROME-B EXTRACTABLE NUCLEAR ANTIBODY: SSB (La) (ENA) Antibody, IgG: <1 AI

## 2017-12-12 LAB — HEPATITIS B SURFACE ANTIGEN: Hepatitis B Surface Ag: NONREACTIVE

## 2017-12-12 LAB — SEDIMENTATION RATE: Sed Rate: 6 mm/h (ref 0–20)

## 2017-12-12 LAB — VITAMIN D 25 HYDROXY (VIT D DEFICIENCY, FRACTURES): Vit D, 25-Hydroxy: 11 ng/mL — ABNORMAL LOW (ref 30–100)

## 2017-12-12 LAB — CYCLIC CITRUL PEPTIDE ANTIBODY, IGG: Cyclic Citrullin Peptide Ab: <16 UNITS

## 2017-12-12 LAB — RNP ANTIBODY: Ribonucleic Protein(ENA) Antibody, IgG: <1 AI

## 2017-12-12 LAB — ANTI-SMITH ANTIBODY: ENA SM Ab Ser-aCnc: <1 AI

## 2017-12-25 ENCOUNTER — Other Ambulatory Visit: Payer: Self-pay | Admitting: Family Medicine

## 2018-01-04 DIAGNOSIS — Z872 Personal history of diseases of the skin and subcutaneous tissue: Secondary | ICD-10-CM | POA: Insufficient documentation

## 2018-01-04 DIAGNOSIS — R899 Unspecified abnormal finding in specimens from other organs, systems and tissues: Secondary | ICD-10-CM | POA: Insufficient documentation

## 2018-01-04 DIAGNOSIS — Z7952 Long term (current) use of systemic steroids: Secondary | ICD-10-CM | POA: Insufficient documentation

## 2018-01-04 DIAGNOSIS — M5030 Other cervical disc degeneration, unspecified cervical region: Secondary | ICD-10-CM | POA: Insufficient documentation

## 2018-01-04 DIAGNOSIS — Z8719 Personal history of other diseases of the digestive system: Secondary | ICD-10-CM | POA: Insufficient documentation

## 2018-01-04 HISTORY — DX: Long term (current) use of systemic steroids: Z79.52
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
## 2018-01-10 ENCOUNTER — Encounter: Payer: Self-pay | Admitting: Rheumatology

## 2018-01-10 ENCOUNTER — Ambulatory Visit (INDEPENDENT_AMBULATORY_CARE_PROVIDER_SITE_OTHER): Payer: Medicare Other | Admitting: Rheumatology

## 2018-01-10 VITALS — BP 137/88 | HR 98 | Resp 14 | Wt 147.0 lb

## 2018-01-10 DIAGNOSIS — Z8719 Personal history of other diseases of the digestive system: Secondary | ICD-10-CM | POA: Diagnosis not present

## 2018-01-10 DIAGNOSIS — Z9889 Other specified postprocedural states: Secondary | ICD-10-CM | POA: Diagnosis not present

## 2018-01-10 DIAGNOSIS — Z970 Presence of artificial eye: Secondary | ICD-10-CM

## 2018-01-10 DIAGNOSIS — M5030 Other cervical disc degeneration, unspecified cervical region: Secondary | ICD-10-CM

## 2018-01-10 DIAGNOSIS — Z872 Personal history of diseases of the skin and subcutaneous tissue: Secondary | ICD-10-CM

## 2018-01-10 DIAGNOSIS — E559 Vitamin D deficiency, unspecified: Secondary | ICD-10-CM

## 2018-01-10 DIAGNOSIS — M353 Polymyalgia rheumatica: Secondary | ICD-10-CM | POA: Diagnosis not present

## 2018-01-10 DIAGNOSIS — I712 Thoracic aortic aneurysm, without rupture, unspecified: Secondary | ICD-10-CM

## 2018-01-10 DIAGNOSIS — Z7952 Long term (current) use of systemic steroids: Secondary | ICD-10-CM | POA: Diagnosis not present

## 2018-01-10 DIAGNOSIS — D473 Essential (hemorrhagic) thrombocythemia: Secondary | ICD-10-CM

## 2018-01-10 DIAGNOSIS — N132 Hydronephrosis with renal and ureteral calculous obstruction: Secondary | ICD-10-CM | POA: Diagnosis not present

## 2018-01-10 DIAGNOSIS — R899 Unspecified abnormal finding in specimens from other organs, systems and tissues: Secondary | ICD-10-CM

## 2018-01-10 DIAGNOSIS — I10 Essential (primary) hypertension: Secondary | ICD-10-CM

## 2018-01-10 DIAGNOSIS — R911 Solitary pulmonary nodule: Secondary | ICD-10-CM

## 2018-01-10 DIAGNOSIS — D75839 Thrombocytosis, unspecified: Secondary | ICD-10-CM

## 2018-01-10 MED ORDER — PREDNISONE 5 MG PO TABS: ORAL_TABLET | ORAL | 0 refills | Status: DC

## 2018-01-10 NOTE — Patient Instructions (Signed)
Please reduce prednisone to 15 mg p.o. daily for 1 month Reduce prednisone to 10 mg p.o. daily for the second month Reduce prednisone by 1 mg every month thereafter until you reach 5 mg p.o. daily

## 2018-02-01 ENCOUNTER — Other Ambulatory Visit: Payer: Self-pay | Admitting: Family Medicine

## 2018-02-07 ENCOUNTER — Other Ambulatory Visit: Payer: Self-pay | Admitting: Rheumatology

## 2018-02-13 ENCOUNTER — Other Ambulatory Visit: Payer: Self-pay | Admitting: Family Medicine

## 2018-02-27 ENCOUNTER — Encounter: Payer: Self-pay | Admitting: Internal Medicine

## 2018-02-27 ENCOUNTER — Ambulatory Visit (INDEPENDENT_AMBULATORY_CARE_PROVIDER_SITE_OTHER): Payer: Medicare Other | Admitting: Internal Medicine

## 2018-02-27 VITALS — BP 126/80 | HR 94 | Temp 98.2°F | Wt 139.0 lb

## 2018-02-27 DIAGNOSIS — R058 Other specified cough: Secondary | ICD-10-CM | POA: Insufficient documentation

## 2018-02-27 DIAGNOSIS — R05 Cough: Secondary | ICD-10-CM | POA: Diagnosis present

## 2018-02-27 MED ORDER — BENZONATATE 100 MG PO CAPS: 100.0000 mg | ORAL_CAPSULE | Freq: Three times a day (TID) | ORAL | 0 refills | Status: DC | PRN

## 2018-02-27 MED ORDER — ALBUTEROL SULFATE HFA 108 (90 BASE) MCG/ACT IN AERS
1.0000 | INHALATION_SPRAY | Freq: Four times a day (QID) | RESPIRATORY_TRACT | 0 refills | Status: DC | PRN
Start: 2018-02-27 — End: 2018-06-01

## 2018-02-27 NOTE — Progress Notes (Signed)
   [facility] Kerrin Mo, MD Phone: [phone]  Reason For Visit: SDA for  Cough   #Cough  Patient states that while in [place] he became very ill.  He had cough, fevers, chills.  He was feeling terrible.  He states he had pneumonia before and this felt like pneumonia.  He went to see a physician over there in [place].  The physician prescribed Augmentin, and expectorant.  Patient took Augmentin for 7 days.  He states that on Thursday he returned from [place] and was still feeling a little bit under the weather, therefore made an appointment. He is now doing much better. He was able to go outside and do his lawn yesterday. Patient's states at this point he only has a cough. Patient does have a hx of asthma and immunosuppression. No wheezing or SOB since Friday. Has not needed his albuterol inhaler for the last two days.   Past Medical History Reviewed problem list.  Medications- reviewed and updated No additions to family history Social history- patient is a  non smoker  Objective: BP 126/80   Pulse 94   Temp 98.2 F (36.8 C) (Oral)   Wt 139 lb (63 kg)   SpO2 98%   BMI 19.94 kg/m  Gen: NAD, alert, cooperative with exam HEENT: Normal    Neck: No masses palpated. No lymphadenopathy    Nose: nasal turbinates moist    Throat: moist mucus membranes, no erythema Cardio: regular rate and rhythm, S1S2 heard, no murmurs appreciated Pulm: clear to auscultation bilaterally, no wheezes, rhonchi or rales Skin: dry, intact, no rashes or lesions   Assessment/Plan: See problem based a/p  Post-viral cough syndrome Post infectious cough - patient looks good today, normal physical exam  - benzonatate (TESSALON) 100 MG capsule; Take 1 capsule (100 mg total) by mouth 3 (three) times daily as needed for cough.  Dispense: 20 capsule; Refill: 0 - albuterol (PROVENTIL HFA;VENTOLIN HFA) 108 (90 Base) MCG/ACT inhaler; Inhale 1-2 puffs into the lungs every 6 (six) hours as  needed for wheezing or shortness of breath.  Dispense: 25.5 g; Refill: 0 - Follow up as needed

## 2018-02-27 NOTE — Assessment & Plan Note (Addendum)
Post infectious cough - patient looks good today, normal physical exam, no complaints - feels close to his normal self.  - benzonatate (TESSALON) 100 MG capsule; Take 1 capsule (100 mg total) by mouth 3 (three) times daily as needed for cough.  Dispense: 20 capsule; Refill: 0 - albuterol (PROVENTIL HFA;VENTOLIN HFA) 108 (90 Base) MCG/ACT inhaler; Inhale 1-2 puffs into the lungs every 6 (six) hours as needed for wheezing or shortness of breath.  Dispense: 25.5 g; Refill: 0 - Follow up as needed

## 2018-02-27 NOTE — Patient Instructions (Signed)
I am happy to see your doing better. Please follow up if things change.

## 2018-03-14 ENCOUNTER — Telehealth: Payer: Self-pay | Admitting: Rheumatology

## 2018-03-14 ENCOUNTER — Ambulatory Visit (INDEPENDENT_AMBULATORY_CARE_PROVIDER_SITE_OTHER): Payer: Medicare Other | Admitting: Family Medicine

## 2018-03-14 ENCOUNTER — Other Ambulatory Visit: Payer: Self-pay

## 2018-03-14 ENCOUNTER — Encounter: Payer: Self-pay | Admitting: Family Medicine

## 2018-03-14 VITALS — BP 118/68 | HR 69 | Temp 98.6°F | Ht 70.0 in | Wt 140.6 lb

## 2018-03-14 DIAGNOSIS — R918 Other nonspecific abnormal finding of lung field: Secondary | ICD-10-CM

## 2018-03-14 DIAGNOSIS — D473 Essential (hemorrhagic) thrombocythemia: Secondary | ICD-10-CM

## 2018-03-14 DIAGNOSIS — R911 Solitary pulmonary nodule: Secondary | ICD-10-CM

## 2018-03-14 DIAGNOSIS — R1900 Intra-abdominal and pelvic swelling, mass and lump, unspecified site: Secondary | ICD-10-CM | POA: Insufficient documentation

## 2018-03-14 DIAGNOSIS — L03115 Cellulitis of right lower limb: Secondary | ICD-10-CM | POA: Diagnosis not present

## 2018-03-14 DIAGNOSIS — I712 Thoracic aortic aneurysm, without rupture, unspecified: Secondary | ICD-10-CM

## 2018-03-14 DIAGNOSIS — D509 Iron deficiency anemia, unspecified: Secondary | ICD-10-CM | POA: Diagnosis not present

## 2018-03-14 DIAGNOSIS — D75839 Thrombocytosis, unspecified: Secondary | ICD-10-CM

## 2018-03-14 HISTORY — DX: Intra-abdominal and pelvic swelling, mass and lump, unspecified site: R19.00

## 2018-03-14 MED ORDER — DOXYCYCLINE HYCLATE 100 MG PO TABS: 100.0000 mg | ORAL_TABLET | Freq: Two times a day (BID) | ORAL | 0 refills | Status: AC

## 2018-03-14 NOTE — Patient Instructions (Addendum)
Thank you for coming in today, it was so nice to see you! Today we talked about:    Skin infection; we are going to put you on a antibiotic called doxycycline.  Please take 1 pill every morning and every night for the next 5 days.  We will need to see you in 1 week to make sure that the healing is going in the right direction  We have scheduled you for a CT chest to monitor the nodule on your lung and the aortic aneurysm your your heart.   Dr. Kieth Brightly at [facility]: [phone] you can call to schedule an appointment.   Please follow up in 1 week . You can schedule this appointment at the front desk before you leave or call the clinic.  If we ordered any tests today, you will be notified via telephone of any abnormalities. If everything is normal you will get a letter in the mail.   If you have any questions or concerns, please do not hesitate to call the office at [phone]. You can also message me directly via MyChart.   Sincerely,  Smitty Cords, MD

## 2018-03-14 NOTE — Telephone Encounter (Signed)
Patient would like to know when he is due for repeat Vit D lab levels? Patient also has a question about decreasing Prednisone. Patient thinks he is to decrease by 1 mg each time, but has 5mg  tabs only. Please call to advise.
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
## 2018-03-14 NOTE — Telephone Encounter (Signed)
Attempted to contact the patient and left message for patient to call the office.  

## 2018-03-15 LAB — CBC
Hematocrit: 35.9 % — ABNORMAL LOW (ref 37.5–51.0)
Hemoglobin: 11.8 g/dL — ABNORMAL LOW (ref 13.0–17.7)
MCH: 33.2 pg — ABNORMAL HIGH (ref 26.6–33.0)
MCHC: 32.9 g/dL (ref 31.5–35.7)
MCV: 101 fL — ABNORMAL HIGH (ref 79–97)
Platelets: 359 10*3/uL (ref 150–450)
RBC: 3.55 x10E6/uL — ABNORMAL LOW (ref 4.14–5.80)
RDW: 14.1 % (ref 12.3–15.4)
WBC: 12.5 10*3/uL — ABNORMAL HIGH (ref 3.4–10.8)

## 2018-03-15 LAB — BASIC METABOLIC PANEL
BUN/Creatinine Ratio: 18 (ref 10–24)
BUN: 19 mg/dL (ref 8–27)
CO2: 25 mmol/L (ref 20–29)
Calcium: 9.6 mg/dL (ref 8.6–10.2)
Chloride: 107 mmol/L — ABNORMAL HIGH (ref 96–106)
Creatinine, Ser: 1.06 mg/dL (ref 0.76–1.27)
GFR calc Af Amer: 80 mL/min/{1.73_m2} (ref >59)
GFR calc non Af Amer: 69 mL/min/{1.73_m2} (ref >59)
Glucose: 77 mg/dL (ref 65–99)
Potassium: 4.3 mmol/L (ref 3.5–5.2)
Sodium: 144 mmol/L (ref 134–144)

## 2018-03-15 MED ORDER — PREDNISONE 1 MG PO TABS: 4.0000 mg | ORAL_TABLET | Freq: Every day | ORAL | 0 refills | Status: DC

## 2018-03-15 NOTE — Addendum Note (Signed)
Addended by: Carole Binning on: 03/15/2018 01:07 PM   Modules accepted: Orders

## 2018-03-15 NOTE — Telephone Encounter (Signed)
Reviewed prednisone taper schedule with patient and he verbalized understanding. Patient will have Vitamin D rechecked on 03/19/18.

## 2018-03-19 ENCOUNTER — Ambulatory Visit (HOSPITAL_COMMUNITY): Payer: Medicare Other

## 2018-03-19 ENCOUNTER — Telehealth: Payer: Self-pay | Admitting: *Deleted

## 2018-03-19 NOTE — Telephone Encounter (Signed)
Pt calls nurse line.  Wants to let the provider know  that he has finished his abx and does not think that his skin is any better.  He has an appt on wed am. Ruthella Kirchman, Salome Spotted, [place]

## 2018-03-21 ENCOUNTER — Ambulatory Visit (INDEPENDENT_AMBULATORY_CARE_PROVIDER_SITE_OTHER): Payer: Medicare Other | Admitting: Family Medicine

## 2018-03-21 ENCOUNTER — Other Ambulatory Visit: Payer: Self-pay

## 2018-03-21 ENCOUNTER — Encounter: Payer: Self-pay | Admitting: Family Medicine

## 2018-03-21 DIAGNOSIS — L0390 Cellulitis, unspecified: Secondary | ICD-10-CM | POA: Insufficient documentation

## 2018-03-21 DIAGNOSIS — L03119 Cellulitis of unspecified part of limb: Secondary | ICD-10-CM | POA: Diagnosis present

## 2018-03-21 NOTE — Progress Notes (Signed)
   Subjective:    Patient ID: Brian Leon , male   DOB: 1946-08-25 , 72 y.o..   MRN: [id number]  HPI  Brian Leon is here for   1. Skin infection follow-up: Patient notes that since taking the doxycycline his lesions have gotten better.  He notes that he still has some areas of wounds that are healing but overall the itchiness, redness and pain is better.  He denies any fevers.  He continues to take prednisone chronically.  Review of Systems: Per HPI.  Past Medical History: Patient Active Problem List   Diagnosis Date Noted  . Abdominal mass 03/14/2018  . Long term (current) use of systemic steroids 01/04/2018  . Abnormal laboratory test 01/04/2018  . History of IBS 01/04/2018  . History of bilateral carpal tunnel release 12/06/2017  . 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 On prednisone therapy 11/27/2017  . Iron deficiency anemia 08/29/2017  . Polymyalgia rheumatica ([place]) 05/21/2017  . Thoracic aortic aneurysm ([place]) 05/21/2017  . Lung nodule 05/21/2017  . Polyarthralgia 04/12/2017  . Thrombocytosis ([place]) 04/12/2017  . Osteoarthritis of spine with radiculopathy, 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  . Cervical radiculopathy at C7 01/12/2015  . Breast mass in male 08/21/2014  . Vitamin D deficiency 06/11/2009  . Psoriasis 06/11/2009  . BENIGN PROSTATIC HYPERTROPHY, HX OF 06/17/2008  . RHINITIS, ALLERGIC 11/30/2006  . Asthma, chronic 11/30/2006    Medications: reviewed   Social Hx:  reports that he quit smoking about 34 years ago. His smoking use included cigarettes. He quit after 20.00 years of use. He has never used smokeless tobacco.   Objective:   BP 110/78   Pulse 73   Temp 98.2 F (36.8 C) (Oral)   Wt 138 lb (62.6 kg)   SpO2 97%   BMI 19.80 kg/m  Physical Exam  Gen: NAD, alert, cooperative with exam, well-appearing Skin: multiple scabs on arms, 1x1cm open ulcerative lesion on right calf with no significant erythema or drainage Psych: good insight, normal mood and  affect       Assessment & Plan:   1. Cellulitis of leg, right: Improved after 5-day course of doxycycline.  Continues to have an ulcerative lesion about the same size as before but erythema is now resolved.  Discussed with patient that we could potentially send him to wound care, he states he will be fine which is using triple antibiotic ointment and Band-Aids at this time.  Complicated wound healing secondary to chronic prednisone. -Continue using triple antibiotic ointment on the lesion and covering with Band-Aid -Discussed potential further wound care if he continues not to heal properly -Return precautions discussed   Smitty Cords, MD [place], PGY-3

## 2018-03-26 ENCOUNTER — Other Ambulatory Visit: Payer: Self-pay | Admitting: Rheumatology

## 2018-03-27 NOTE — Telephone Encounter (Signed)
Patient advised we will need to recheck vitamin d before we can refill his vitamin d.

## 2018-04-12 ENCOUNTER — Other Ambulatory Visit: Payer: Self-pay | Admitting: Rheumatology

## 2018-04-12 ENCOUNTER — Other Ambulatory Visit: Payer: Self-pay

## 2018-04-12 DIAGNOSIS — E559 Vitamin D deficiency, unspecified: Secondary | ICD-10-CM | POA: Diagnosis not present

## 2018-04-12 NOTE — Telephone Encounter (Signed)
Patient is decreasing to 8 mg.

## 2018-04-12 NOTE — Telephone Encounter (Signed)
Last Visit: 01/10/18 Next Visit: 05/02/18  Left message for patient to call the office.

## 2018-04-13 ENCOUNTER — Telehealth: Payer: Self-pay | Admitting: *Deleted

## 2018-04-13 LAB — VITAMIN D 25 HYDROXY (VIT D DEFICIENCY, FRACTURES): Vit D, 25-Hydroxy: 68 ng/mL (ref 30–100)

## 2018-04-13 MED ORDER — VITAMIN D (ERGOCALCIFEROL) 1.25 MG (50000 UNIT) PO CAPS: 50000.0000 [IU] | ORAL_CAPSULE | ORAL | 1 refills | Status: DC

## 2018-04-13 NOTE — Progress Notes (Signed)
Vitamin D is normal now.  Patient can take 50,000 units once a month or 2000 units daily over-the-counter.

## 2018-04-13 NOTE — Telephone Encounter (Signed)
-----   Message from Bo Merino, MD sent at 04/13/2018 10:34 AM EDT ----- Vitamin D is normal now.  Patient can take 50,000 units once a month or 2000 units daily over-the-counter.

## 2018-04-15 ENCOUNTER — Other Ambulatory Visit: Payer: Self-pay | Admitting: Family Medicine
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
## 2018-04-20 ENCOUNTER — Other Ambulatory Visit: Payer: Self-pay | Admitting: *Deleted

## 2018-04-20 MED ORDER — FLUTICASONE-SALMETEROL 500-50 MCG/DOSE IN AEPB: INHALATION_SPRAY | RESPIRATORY_TRACT | 4 refills | Status: DC

## 2018-04-24 DIAGNOSIS — L2081 Atopic neurodermatitis: Secondary | ICD-10-CM | POA: Diagnosis not present

## 2018-04-24 DIAGNOSIS — Z79899 Other long term (current) drug therapy: Secondary | ICD-10-CM | POA: Diagnosis not present

## 2018-04-24 DIAGNOSIS — L280 Lichen simplex chronicus: Secondary | ICD-10-CM | POA: Diagnosis not present

## 2018-04-29 ENCOUNTER — Other Ambulatory Visit: Payer: Self-pay | Admitting: Family Medicine

## 2018-05-02 ENCOUNTER — Ambulatory Visit (INDEPENDENT_AMBULATORY_CARE_PROVIDER_SITE_OTHER): Payer: Medicare Other | Admitting: Rheumatology

## 2018-05-02 ENCOUNTER — Encounter: Payer: Self-pay | Admitting: Rheumatology

## 2018-05-02 VITALS — BP 136/88 | HR 72 | Resp 14 | Ht 69.0 in | Wt 137.0 lb

## 2018-05-02 DIAGNOSIS — Z970 Presence of artificial eye: Secondary | ICD-10-CM

## 2018-05-02 DIAGNOSIS — M353 Polymyalgia rheumatica: Secondary | ICD-10-CM | POA: Diagnosis not present

## 2018-05-02 DIAGNOSIS — Z872 Personal history of diseases of the skin and subcutaneous tissue: Secondary | ICD-10-CM

## 2018-05-02 DIAGNOSIS — N132 Hydronephrosis with renal and ureteral calculous obstruction: Secondary | ICD-10-CM | POA: Diagnosis not present

## 2018-05-02 DIAGNOSIS — Z8719 Personal history of other diseases of the digestive system: Secondary | ICD-10-CM

## 2018-05-02 DIAGNOSIS — I712 Thoracic aortic aneurysm, without rupture, unspecified: Secondary | ICD-10-CM

## 2018-05-02 DIAGNOSIS — Z7952 Long term (current) use of systemic steroids: Secondary | ICD-10-CM

## 2018-05-02 DIAGNOSIS — M5030 Other cervical disc degeneration, unspecified cervical region: Secondary | ICD-10-CM | POA: Diagnosis not present

## 2018-05-02 DIAGNOSIS — Z9889 Other specified postprocedural states: Secondary | ICD-10-CM | POA: Diagnosis not present

## 2018-05-02 DIAGNOSIS — D473 Essential (hemorrhagic) thrombocythemia: Secondary | ICD-10-CM | POA: Diagnosis not present

## 2018-05-02 DIAGNOSIS — R911 Solitary pulmonary nodule: Secondary | ICD-10-CM

## 2018-05-02 DIAGNOSIS — E559 Vitamin D deficiency, unspecified: Secondary | ICD-10-CM | POA: Diagnosis not present

## 2018-05-02 DIAGNOSIS — D75839 Thrombocytosis, unspecified: Secondary | ICD-10-CM

## 2018-05-02 DIAGNOSIS — I10 Essential (primary) hypertension: Secondary | ICD-10-CM | POA: Diagnosis not present

## 2018-05-02 MED ORDER — PREDNISONE 5 MG PO TABS: ORAL_TABLET | ORAL | 0 refills | Status: DC

## 2018-05-02 NOTE — Patient Instructions (Signed)
Alendronate tablets  What is this medicine?  ALENDRONATE (a LEN droe nate) slows calcium loss from bones. It helps to make normal healthy bone and to slow bone loss in people with Paget's disease and osteoporosis. It may be used in others at risk for bone loss.  This medicine may be used for other purposes; ask your health care provider or pharmacist if you have questions.  COMMON BRAND NAME(S): Fosamax  What should I tell my health care provider before I take this medicine?  They need to know if you have any of these conditions:  -dental disease  -esophagus, stomach, or intestine problems, like acid reflux or GERD  -kidney disease  -low blood calcium  -low vitamin D  -problems sitting or standing 30 minutes  -trouble swallowing  -an unusual or allergic reaction to alendronate, other medicines, foods, dyes, or preservatives  -pregnant or trying to get pregnant  -breast-feeding  How should I use this medicine?  You must take this medicine exactly as directed or you will lower the amount of the medicine you absorb into your body or you may cause yourself harm. Take this medicine by mouth first thing in the morning, after you are up for the day. Do not eat or drink anything before you take your medicine. Swallow the tablet with a full glass (6 to 8 fluid ounces) of plain water. Do not take this medicine with any other drink. Do not chew or crush the tablet. After taking this medicine, do not eat breakfast, drink, or take any medicines or vitamins for at least 30 minutes. Sit or stand up for at least 30 minutes after you take this medicine; do not lie down. Do not take your medicine more often than directed.  Talk to your pediatrician regarding the use of this medicine in children. Special care may be needed.  Overdosage: If you think you have taken too much of this medicine contact a poison control center or emergency room at once.  NOTE: This medicine is only for you. Do not share this medicine with others.  What if I  miss a dose?  If you miss a dose, do not take it later in the day. Continue your normal schedule starting the next morning. Do not take double or extra doses.  What may interact with this medicine?  -aluminum hydroxide  -antacids  -aspirin  -calcium supplements  -drugs for inflammation like ibuprofen, naproxen, and others  -iron supplements  -magnesium supplements  -vitamins with minerals  This list may not describe all possible interactions. Give your health care provider a list of all the medicines, herbs, non-prescription drugs, or dietary supplements you use. Also tell them if you smoke, drink alcohol, or use illegal drugs. Some items may interact with your medicine.  What should I watch for while using this medicine?  Visit your doctor or health care professional for regular checks ups. It may be some time before you see benefit from this medicine. Do not stop taking your medicine except on your doctor's advice. Your doctor or health care professional may order blood tests and other tests to see how you are doing.  You should make sure you get enough calcium and vitamin D while you are taking this medicine, unless your doctor tells you not to. Discuss the foods you eat and the vitamins you take with your health care professional.  Some people who take this medicine have severe bone, joint, and/or muscle pain. This medicine may also increase your risk   for a broken thigh bone. Tell your doctor right away if you have pain in your upper leg or groin. Tell your doctor if you have any pain that does not go away or that gets worse.  This medicine can make you more sensitive to the sun. If you get a rash while taking this medicine, sunlight may cause the rash to get worse. Keep out of the sun. If you cannot avoid being in the sun, wear protective clothing and use sunscreen. Do not use sun lamps or tanning beds/booths.  What side effects may I notice from receiving this medicine?  Side effects that you should report to  your doctor or health care professional as soon as possible:  -allergic reactions like skin rash, itching or hives, swelling of the face, lips, or tongue  -black or tarry stools  -bone, muscle or joint pain  -changes in vision  -chest pain  -heartburn or stomach pain  -jaw pain, especially after dental work  -pain or trouble when swallowing  -redness, blistering, peeling or loosening of the skin, including inside the mouth  Side effects that usually do not require medical attention (report to your doctor or health care professional if they continue or are bothersome):  -changes in taste  -diarrhea or constipation  -eye pain or itching  -headache  -nausea or vomiting  -stomach gas or fullness  This list may not describe all possible side effects. Call your doctor for medical advice about side effects. You may report side effects to FDA at 1-800-FDA-1088.  Where should I keep my medicine?  Keep out of the reach of children.  Store at room temperature of 15 and 30 degrees C (59 and 86 degrees F). Throw away any unused medicine after the expiration date.  NOTE: This sheet is a summary. It may not cover all possible information. If you have questions about this medicine, talk to your doctor, pharmacist, or health care provider.   2018 Elsevier/Gold Standard (2011-03-18 08:56:09)

## 2018-05-07 ENCOUNTER — Ambulatory Visit (INDEPENDENT_AMBULATORY_CARE_PROVIDER_SITE_OTHER): Payer: Medicare Other | Admitting: Student in an Organized Health Care Education/Training Program

## 2018-05-07 ENCOUNTER — Other Ambulatory Visit: Payer: Self-pay

## 2018-05-07 VITALS — BP 108/66 | HR 68 | Temp 98.5°F | Ht 70.0 in | Wt 135.4 lb

## 2018-05-07 DIAGNOSIS — W540XXA Bitten by dog, initial encounter: Secondary | ICD-10-CM

## 2018-05-07 DIAGNOSIS — L539 Erythematous condition, unspecified: Secondary | ICD-10-CM | POA: Diagnosis not present

## 2018-05-07 DIAGNOSIS — S91012A Laceration without foreign body, left ankle, initial encounter: Secondary | ICD-10-CM | POA: Diagnosis not present

## 2018-05-07 DIAGNOSIS — S91011A Laceration without foreign body, right ankle, initial encounter: Secondary | ICD-10-CM

## 2018-05-07 DIAGNOSIS — Z23 Encounter for immunization: Secondary | ICD-10-CM | POA: Diagnosis not present

## 2018-05-07 MED ORDER — MUPIROCIN 2 % EX OINT: 1.0000 "application " | TOPICAL_OINTMENT | Freq: Two times a day (BID) | CUTANEOUS | 0 refills | Status: DC

## 2018-05-07 MED ORDER — DOXYCYCLINE HYCLATE 100 MG PO TABS: 100.0000 mg | ORAL_TABLET | Freq: Two times a day (BID) | ORAL | 0 refills | Status: DC

## 2018-05-07 NOTE — Patient Instructions (Signed)
It was a pleasure seeing you today in our clinic.   Our clinic's number is [phone]. Please call with questions or concerns about what we discussed today.  Be well, Dr. Charley Miske   

## 2018-05-08 ENCOUNTER — Other Ambulatory Visit: Payer: Self-pay | Admitting: General Surgery

## 2018-05-08 DIAGNOSIS — D171 Benign lipomatous neoplasm of skin and subcutaneous tissue of trunk: Secondary | ICD-10-CM

## 2018-05-10 ENCOUNTER — Ambulatory Visit
Admission: RE | Admit: 2018-05-10 | Discharge: 2018-05-10 | Disposition: A | Discharge status: Home / Self Care | Payer: Medicare Other | Source: Ambulatory Visit | Attending: Rheumatology | Admitting: Rheumatology

## 2018-05-10 DIAGNOSIS — Z7952 Long term (current) use of systemic steroids: Secondary | ICD-10-CM

## 2018-05-10 DIAGNOSIS — M85832 Other specified disorders of bone density and structure, left forearm: Secondary | ICD-10-CM | POA: Diagnosis not present

## 2018-05-10 DIAGNOSIS — M810 Age-related osteoporosis without current pathological fracture: Secondary | ICD-10-CM | POA: Diagnosis not present

## 2018-05-10 NOTE — Progress Notes (Signed)
DXA c/w Op. Please call in Fosamax 70 mg po q wk.Call in 90 d rx1

## 2018-05-11 ENCOUNTER — Telehealth: Payer: Self-pay | Admitting: Rheumatology

## 2018-05-11 MED ORDER — ALENDRONATE SODIUM 70 MG PO TABS: 70.0000 mg | ORAL_TABLET | ORAL | 1 refills | Status: DC

## 2018-05-11 NOTE — Telephone Encounter (Signed)
Patient was returning Brian Leon's call, but does not know what it was concerning. Please call to advise.

## 2018-05-11 NOTE — Telephone Encounter (Signed)
Returned patient's call and advised patient of bone density results. Patient verbalized understanding and fosamax has been sent to the pharmacy.

## 2018-05-12 NOTE — Progress Notes (Signed)
   CC: Laceration of the right ankle  HPI: Brian Leon is a 72 y.o. male on chronic steroids for polymyalgia rheumatica, presenting with a laceration of the right ankle.  Patient presents for a laceration of his right ankle that has been present for the last 3 weeks.  Patient reports that injured his ankle on a rusty screw.  He has been cleaning the wound with alcohol, and placing antibiotic cream over the injury.  He has noticed that the injury is not healing, which he attributed to chronic steroid taper for history of polymyalgia rheumatica. He has noticed erythema and warmth surrounding the lesion over the past several days. He does endorse yellow drainage from the laceration. No fevers No hx of diabetes. His tetanus vaccine is outdated.  Review of Symptoms:  See HPI for ROS.   CC, SH/smoking status, and VS noted.  Objective: BP 108/66   Pulse 68   Temp 98.5 F (36.9 C) (Oral)   Ht 5\' 10"  (1.778 m)   Wt 135 lb 6.4 oz (61.4 kg)   SpO2 95%   BMI 19.43 kg/m  GEN: NAD, alert, cooperative, and pleasant. RESPIRATORY: comfortable work of breathing CV: regular rate noted GI: soft, non-distended SKIN: right ankle: +2-3cm long shallow laceration (no puncture wound) with yellow granulation tissue. 2-3cm surrounding erythema and warmth.  NEURO: peripheral sensation intact PSYCH: alert and oriented, appropriate affect   Assessment and plan:  Laceration  Patient at higher risk for cellulitis given chronic steroid use. There is mild surrounding erythema today. Will treat with doxycycline and bactroban.  - nonstick bandage with ace wrap applied in the office - discontinue cleaning with alcohol - doxycycline (VIBRA-TABS) 100 MG tablet; Take 1 tablet (100 mg total) by mouth 2 (two) times daily.  Dispense: 20 tablet; Refill: 0 - mupirocin ointment (BACTROBAN) 2 %; Apply 1 application topically 2 (two) times daily.  Dispense: 22 g; Refill: 0 - Tdap vaccine  - follow up in 1 week or sooner if  symptoms worsen or fail to improve  Orders Placed This Encounter  Procedures  . Tdap vaccine greater than or equal to 7yo IM    Meds ordered this encounter  Medications  . doxycycline (VIBRA-TABS) 100 MG tablet    Sig: Take 1 tablet (100 mg total) by mouth 2 (two) times daily.    Dispense:  20 tablet    Refill:  0  . mupirocin ointment (BACTROBAN) 2 %    Sig: Apply 1 application topically 2 (two) times daily.    Dispense:  22 g    Refill:  0     Everrett Coombe, MD,MS,  PGY3 05/12/2018 10:25 AM

## 2018-05-14 ENCOUNTER — Other Ambulatory Visit: Payer: Self-pay

## 2018-05-14 ENCOUNTER — Ambulatory Visit (INDEPENDENT_AMBULATORY_CARE_PROVIDER_SITE_OTHER): Payer: Medicare Other | Admitting: Student in an Organized Health Care Education/Training Program

## 2018-05-14 ENCOUNTER — Encounter: Payer: Self-pay | Admitting: Student in an Organized Health Care Education/Training Program

## 2018-05-14 VITALS — BP 130/76 | HR 66 | Temp 99.1°F | Ht 70.0 in | Wt 137.2 lb

## 2018-05-14 DIAGNOSIS — S91011A Laceration without foreign body, right ankle, initial encounter: Secondary | ICD-10-CM

## 2018-05-14 NOTE — Patient Instructions (Signed)
Please schedule follow up to be seen in 3 weeks  Our clinic's number is [phone]. Please call with questions or concerns about what we discussed today.  Be well, Dr. Burr Medico

## 2018-05-14 NOTE — Progress Notes (Signed)
   CC: right leg laceration  HPI: Brian Leon is a 72 y.o. male presenting for follow up right shin laceration.  He does have poor wound healing due to chronic steroid use in setting of polymyalgia rheumatica.   Since his last visit he has taken doxycycline, 2.5 days left in his 10 day course. He has been using mupirocin ointment and nonstick dressing with ACE wrap since his last visit.  He endorses continued pain over the shin but decreased redness and warmth. He continues to see granulation tissue and the wound is still open.  Tetanus shot given at last visit.  Review of Symptoms:  See HPI for ROS.   CC, SH/smoking status, and VS noted.  Objective: BP 130/76   Pulse 66   Temp 99.1 F (37.3 C) (Oral)   Ht 5\' 10"  (1.778 m)   Wt 137 lb 3.2 oz (62.2 kg)   SpO2 97%   BMI 19.69 kg/m  GEN: NAD, alert, cooperative, and pleasant. RESPIRATORY: comfortable work of breathing CV: RRR, SKIN: right ankle: +2-3cm long shallow laceration (no puncture wound) with yellow granulation tissue. NO surrunding erythema and warmth. o NEURO: II-XII grossly intact PSYCH: AAOx3, appropriate affect   Assessment and plan:  Right lower extremity laceration - complete 10 day doxycycline course - leg does NOT look infected at today's visit - open wound continues to exhibit slow healing - continue bactroban - follow up in 3 weeks to ensure continued healing - return precautions for cellulitis were discussed  Everrett Coombe, MD,MS,  PGY3 05/14/2018 3:15 PM

## 2018-05-18 ENCOUNTER — Ambulatory Visit
Admission: RE | Admit: 2018-05-18 | Discharge: 2018-05-18 | Disposition: A | Discharge status: Home / Self Care | Payer: Medicare Other | Source: Ambulatory Visit | Attending: General Surgery | Admitting: General Surgery

## 2018-05-18 DIAGNOSIS — K5730 Diverticulosis of large intestine without perforation or abscess without bleeding: Secondary | ICD-10-CM | POA: Diagnosis not present

## 2018-05-18 DIAGNOSIS — D171 Benign lipomatous neoplasm of skin and subcutaneous tissue of trunk: Secondary | ICD-10-CM

## 2018-05-18 MED ORDER — IOPAMIDOL (ISOVUE-300) INJECTION 61%
100.0000 mL | Freq: Once | INTRAVENOUS | Status: AC | PRN
  Administered 2018-05-18: 100 mL via INTRAVENOUS

## 2018-05-31 ENCOUNTER — Telehealth: Payer: Self-pay | Admitting: Rheumatology

## 2018-05-31 MED ORDER — PREDNISONE 1 MG PO TABS: 2.0000 mg | ORAL_TABLET | Freq: Every day | ORAL | 0 refills | Status: DC

## 2018-05-31 NOTE — Telephone Encounter (Signed)
Patient is down to Prednisone 7mg , and needs rx for 1mg  sent into [facility] on [address].

## 2018-05-31 NOTE — Telephone Encounter (Signed)
Last Visit: 05/02/18 Next Visit: 08/02/18  Okay to refill per Dr. Estanislado Pandy

## 2018-06-01 ENCOUNTER — Other Ambulatory Visit: Payer: Self-pay | Admitting: Internal Medicine

## 2018-06-01 DIAGNOSIS — R058 Other specified cough: Secondary | ICD-10-CM

## 2018-06-01 DIAGNOSIS — R05 Cough: Secondary | ICD-10-CM

## 2018-06-03 ENCOUNTER — Other Ambulatory Visit: Payer: Self-pay | Admitting: Rheumatology

## 2018-06-05 ENCOUNTER — Encounter: Payer: Self-pay | Admitting: Family Medicine

## 2018-06-05 ENCOUNTER — Other Ambulatory Visit: Payer: Self-pay

## 2018-06-05 ENCOUNTER — Ambulatory Visit (INDEPENDENT_AMBULATORY_CARE_PROVIDER_SITE_OTHER): Payer: Medicare Other | Admitting: Family Medicine

## 2018-06-05 VITALS — BP 136/74 | HR 78 | Temp 98.2°F | Ht 70.0 in | Wt 136.0 lb

## 2018-06-05 DIAGNOSIS — L03119 Cellulitis of unspecified part of limb: Secondary | ICD-10-CM

## 2018-06-05 DIAGNOSIS — Z23 Encounter for immunization: Secondary | ICD-10-CM

## 2018-06-05 NOTE — Patient Instructions (Addendum)
Keep up the good wound care. You can come back in 3-4 weeks to make sure this continues to do well.  Things to watch out for are increasing redness, fever, pus-like drainage, worsening pain. I see none of these signs today.

## 2018-06-05 NOTE — Telephone Encounter (Signed)
Last Visit: 05/02/18 Next Visit: 08/02/18  Okay to refill per Dr. Estanislado Pandy

## 2018-06-05 NOTE — Progress Notes (Signed)
    Subjective:  Brian Leon is a 72 y.o. male who presents to the [facility] today for follow up R shin laceration.   HPI:  Patient has a history of poor wound healing due to chronic steroid use in the setting of polymyalgia rheumatica.  He has been seen for multiple leg lacerations over the last 2 months.  Is here for follow-up today of his right shin wound and another one on his right calf that he was last seen for this on 05/14/2018. He finished his antibiotics couple of weeks ago.  He states that he has been noticing improved pain and swelling since.  No fever or chills.  No redness or increased warmth.  No significant drainage.  Has been using warm water and soap to clean it twice a day.  He has been using Vaseline to keep a moist wound environment and has been keeping the areas covered.   ROS: Per HPI   Objective:  Physical Exam: BP 136/74   Pulse 78   Temp 98.2 F (36.8 C) (Oral)   Ht 5\' 10"  (1.778 m)   Wt 136 lb (61.7 kg)   SpO2 97%   BMI 19.51 kg/m   Gen: NAD, resting comfortably Pulm: NWOB on room air MSK: no edema, cyanosis, or clubbing noted Skin: 1 x 2 cm wound on right shin with yellow granulation tissue without surrounding erythema or warmth.  Another very shallow thin laceration on right upper calf approximately 2 3 cm in length showing good wound healing.  No drainage. Neuro: grossly normal, moves all extremities Psych: Normal affect and thought content   Assessment/Plan:  Cellulitis Doing well off of antibiotics.  He has completed a 10-day course of doxycycline earlier this month.  He does have a history of poor wound healing because of chronic steroids for polymyalgia rheumatica.  His wounds appear to be healing well without any signs or symptoms of infection today.  Discussed follow-up in 3 to 4 weeks to monitor for continued healing.  Reinforced good wound care techniques at home.   Bufford Lope, DO PGY-3, [facility] 06/05/2018 1:42 PM

## 2018-06-05 NOTE — Assessment & Plan Note (Signed)
Doing well off of antibiotics.  He has completed a 10-day course of doxycycline earlier this month.  He does have a history of poor wound healing because of chronic steroids for polymyalgia rheumatica.  His wounds appear to be healing well without any signs or symptoms of infection today.  Discussed follow-up in 3 to 4 weeks to monitor for continued healing.  Reinforced good wound care techniques at home.

## 2018-06-28 ENCOUNTER — Other Ambulatory Visit: Payer: Self-pay | Admitting: Rheumatology

## 2018-06-29 ENCOUNTER — Ambulatory Visit: Payer: Self-pay | Admitting: General Surgery

## 2018-06-29 NOTE — Telephone Encounter (Signed)
Last Visit: 05/02/18 Next Visit: 08/02/18  Okay to refill per Dr. Estanislado Pandy

## 2018-07-03 ENCOUNTER — Encounter: Payer: Self-pay | Admitting: Family Medicine

## 2018-07-03 ENCOUNTER — Ambulatory Visit (INDEPENDENT_AMBULATORY_CARE_PROVIDER_SITE_OTHER): Payer: Medicare Other | Admitting: Family Medicine

## 2018-07-03 ENCOUNTER — Other Ambulatory Visit: Payer: Self-pay

## 2018-07-03 VITALS — BP 118/70 | HR 69 | Temp 99.2°F | Wt 133.0 lb

## 2018-07-03 DIAGNOSIS — T148XXA Other injury of unspecified body region, initial encounter: Secondary | ICD-10-CM | POA: Insufficient documentation

## 2018-07-03 HISTORY — DX: Other injury of unspecified body region, initial encounter: T14.8XXA

## 2018-07-03 NOTE — Progress Notes (Signed)
    Subjective:  Brian Leon is a 72 y.o. male who presents to the [facility] today with a chief complaint of nonhealing wound.   HPI:  Patient is here for follow-up today of his right shin wound because despite using Vaseline at home and consistent good wound care with bandages, his right shin wound has not been healing.  Over the last week he has noticed a new nodule on the lower part of it. No fevers.  No drainage No increased redness or warmth  ROS: Per HPI   Objective:  Physical Exam: BP 118/70   Pulse 69   Temp 99.2 F (37.3 C) (Oral)   Wt 133 lb (60.3 kg)   SpO2 98%   BMI 19.08 kg/m   Gen: NAD, resting comfortably MSK: no edema, cyanosis, or clubbing noted Skin: 0.8 x 2 cm R shin laceration with yellow eschar  Neuro: grossly normal, moves all extremities Psych: Normal affect and thought content      Assessment/Plan:  Nonhealing nonsurgical wound Patient has a nonhealing right shin laceration in the setting of poor wound healing due to chronic steroid use.  He was seen for this on 06/05/2018 as well as on 05/14/2018.  He had previously had a course of antibiotic doxycycline 10 days in August but no recent antibiotic use. Discussed treat with replicare duoderm patches, which was applied today, and follow up in 2 weeks.   Bufford Lope, DO PGY-3, [facility] 07/03/2018 3:08 PM

## 2018-07-03 NOTE — Assessment & Plan Note (Addendum)
Patient has a nonhealing right shin laceration in the setting of poor wound healing due to chronic steroid use.  He was seen for this on 06/05/2018 as well as on 05/14/2018.  He had previously had a course of antibiotic doxycycline 10 days in August but no recent antibiotic use. Discussed treat with replicare duoderm patches, which was applied today, and follow up in 2 weeks.

## 2018-07-03 NOTE — Patient Instructions (Addendum)
Please keep the replicare patch covering the laceration, it should last 1 week.  Come back in 2 weeks for recheck  Start a multivitamin and vitamin C

## 2018-07-16 ENCOUNTER — Other Ambulatory Visit: Payer: Self-pay

## 2018-07-16 ENCOUNTER — Ambulatory Visit (INDEPENDENT_AMBULATORY_CARE_PROVIDER_SITE_OTHER): Payer: Medicare Other | Admitting: Family Medicine

## 2018-07-16 ENCOUNTER — Encounter: Payer: Self-pay | Admitting: Family Medicine

## 2018-07-16 VITALS — BP 130/80 | HR 84 | Temp 98.3°F | Wt 135.0 lb

## 2018-07-16 DIAGNOSIS — T148XXA Other injury of unspecified body region, initial encounter: Secondary | ICD-10-CM

## 2018-07-16 NOTE — Progress Notes (Signed)
    Subjective:    Patient ID: Brian Leon, male    DOB: 1946-01-02, 72 y.o.   MRN: [id number]   CC: follow up wound on R shin  HPI: patient reports he is continuing to dress his shin wound with bandages [facility] that are similar to the ones we used in clinic, they form a skin-like seal and keep out water. He will change this every few days. He reports there is some thick yellow substance when he changes the dressing but once he cleanses the wound it looks clean and without any drainage. There is no surrounding redness. He has very thin skin and has to be careful when removing bandage to not tear skin. He also recently got another cut on his left wrist while kayaking and states "the skin just tore" so he has been using the bandages on that as well. He states it has already healed more than the wound on his leg. He denies fevers or chills. He is pleased with some progress in healing today.  Review of Systems- see HPI   Objective:  BP 130/80   Pulse 84   Temp 98.3 F (36.8 C) (Oral)   Wt 135 lb (61.2 kg)   SpO2 99%   BMI 19.37 kg/m  Vitals and nursing note reviewed  General: well nourished, in no acute distress HEENT: normocephalic, TM's visualized bilaterally, no scleral icterus or conjunctival pallor, no nasal discharge, moist mucous membranes, good dentition without erythema or discharge noted in posterior oropharynx Neck: supple, non-tender, without lymphadenopathy Cardiac: RRR, clear S1 and S2, no murmurs, rubs, or gallops Respiratory: clear to auscultation bilaterally, no increased work of breathing Abdomen: soft, nontender, nondistended, no masses or organomegaly. Bowel sounds present Extremities: no edema or cyanosis. Warm, well perfused. 2+ radial and PT pulses bilaterally Skin: warm and dry, no rashes noted Neuro: alert and oriented, no focal deficits   Assessment & Plan:    Nonhealing nonsurgical wound Wound cleansed in office gently with soap and water, new  dressing applied. Overall the wound appears improved from last visit's picture. It is slightly smaller in diameter. There is a healthy bed of pink granulation tissue. There is no drainage or odor. There is no surrounding erythema. No signs of infection. Advised patient to continue wound dressing as he has done at home and follow up in 10 days with PCP to reassess. Did discuss with him that this may take a long time to fully heal but I believe we are on the right track. Return precautions reviewed. Patient verbalized understanding and agreement with plan.     Return in about 10 days (around 07/26/2018).   Lucila Maine, DO Family Medicine Resident PGY-3

## 2018-07-16 NOTE — Patient Instructions (Signed)
  Keep up the diligent work! You can wash gently with soap and water then pat dry before putting on new bandage.  Please call us if you have worsening pain or spreading redness.  We'll see you in about 10 days  If you have questions or concerns please do not hesitate to call at [phone].  Lucila Maine, DO PGY-3, [facility] 07/16/2018 3:24 PM

## 2018-07-17 NOTE — Assessment & Plan Note (Signed)
Wound cleansed in office gently with soap and water, new dressing applied. Overall the wound appears improved from last visit's picture. It is slightly smaller in diameter. There is a healthy bed of pink granulation tissue. There is no drainage or odor. There is no surrounding erythema. No signs of infection. Advised patient to continue wound dressing as he has done at home and follow up in 10 days with PCP to reassess. Did discuss with him that this may take a long time to fully heal but I believe we are on the right track. Return precautions reviewed. Patient verbalized understanding and agreement with plan.
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
## 2018-07-26 ENCOUNTER — Encounter: Payer: Self-pay | Admitting: Family Medicine

## 2018-07-26 ENCOUNTER — Other Ambulatory Visit: Payer: Self-pay

## 2018-07-26 ENCOUNTER — Other Ambulatory Visit: Payer: Self-pay | Admitting: Rheumatology

## 2018-07-26 ENCOUNTER — Ambulatory Visit (INDEPENDENT_AMBULATORY_CARE_PROVIDER_SITE_OTHER): Payer: Medicare Other | Admitting: Family Medicine

## 2018-07-26 VITALS — BP 105/68 | HR 73 | Temp 98.7°F | Wt 136.0 lb

## 2018-07-26 DIAGNOSIS — T148XXA Other injury of unspecified body region, initial encounter: Secondary | ICD-10-CM

## 2018-07-26 NOTE — Progress Notes (Signed)
    Subjective:  Brian Leon is a 72 y.o. male who presents to the [facility] today with a chief complaint of nonhealing wound.   HPI:  Patient is here for follow up his R shin wound that had been slow to heal. He has been using nonstick bandages and keeping the area clean. He states the wounds is now much smaller. No drainage. No pain or redness. No fever/chills  ROS: Per HPI   Objective:  Physical Exam: BP 105/68   Pulse 73   Temp 98.7 F (37.1 C) (Oral)   Wt 136 lb (61.7 kg)   SpO2 97%   BMI 19.51 kg/m   Gen: NAD, resting comfortably Pulm: NWOB, CTAB with no crackles, wheezes, or rhonchi GI: Normal bowel sounds present. Soft, Nontender, Nondistended. MSK: no edema, cyanosis, or clubbing noted Skin: healing R shin wound with intact bridge of skin centrally, appears much shallower in appearance. See picture below Neuro: grossly normal, moves all extremities Psych: Normal affect and thought content      Assessment/Plan:  Nonhealing nonsurgical wound Doing well with signs of wound healing. No signs or symptoms of infection. Recheck in 2 weeks.   Brian Lope, DO PGY-3, [facility] 07/26/2018 4:21 PM

## 2018-07-26 NOTE — Patient Instructions (Addendum)
Come back for recheck in 2 weeks.

## 2018-07-26 NOTE — Assessment & Plan Note (Addendum)
Doing well with signs of wound healing. No signs or symptoms of infection. Recheck in 2 weeks.

## 2018-08-02 ENCOUNTER — Ambulatory Visit (INDEPENDENT_AMBULATORY_CARE_PROVIDER_SITE_OTHER): Payer: Medicare Other | Admitting: Rheumatology

## 2018-08-02 ENCOUNTER — Encounter: Payer: Self-pay | Admitting: Rheumatology

## 2018-08-02 VITALS — BP 146/85 | HR 69 | Resp 14 | Ht 70.0 in | Wt 136.6 lb

## 2018-08-02 DIAGNOSIS — M353 Polymyalgia rheumatica: Secondary | ICD-10-CM | POA: Diagnosis not present

## 2018-08-02 DIAGNOSIS — I712 Thoracic aortic aneurysm, without rupture, unspecified: Secondary | ICD-10-CM

## 2018-08-02 DIAGNOSIS — R911 Solitary pulmonary nodule: Secondary | ICD-10-CM

## 2018-08-02 DIAGNOSIS — Z872 Personal history of diseases of the skin and subcutaneous tissue: Secondary | ICD-10-CM | POA: Diagnosis not present

## 2018-08-02 DIAGNOSIS — E559 Vitamin D deficiency, unspecified: Secondary | ICD-10-CM | POA: Diagnosis not present

## 2018-08-02 DIAGNOSIS — Z9889 Other specified postprocedural states: Secondary | ICD-10-CM

## 2018-08-02 DIAGNOSIS — Z8719 Personal history of other diseases of the digestive system: Secondary | ICD-10-CM

## 2018-08-02 DIAGNOSIS — N132 Hydronephrosis with renal and ureteral calculous obstruction: Secondary | ICD-10-CM | POA: Diagnosis not present

## 2018-08-02 DIAGNOSIS — D75839 Thrombocytosis, unspecified: Secondary | ICD-10-CM

## 2018-08-02 DIAGNOSIS — Z970 Presence of artificial eye: Secondary | ICD-10-CM

## 2018-08-02 DIAGNOSIS — Z7952 Long term (current) use of systemic steroids: Secondary | ICD-10-CM

## 2018-08-02 DIAGNOSIS — M5030 Other cervical disc degeneration, unspecified cervical region: Secondary | ICD-10-CM

## 2018-08-02 DIAGNOSIS — I10 Essential (primary) hypertension: Secondary | ICD-10-CM | POA: Diagnosis not present

## 2018-08-02 DIAGNOSIS — D473 Essential (hemorrhagic) thrombocythemia: Secondary | ICD-10-CM

## 2018-08-09 ENCOUNTER — Encounter: Payer: Self-pay | Admitting: Family Medicine

## 2018-08-09 ENCOUNTER — Other Ambulatory Visit: Payer: Self-pay

## 2018-08-09 ENCOUNTER — Ambulatory Visit (INDEPENDENT_AMBULATORY_CARE_PROVIDER_SITE_OTHER): Payer: Medicare Other | Admitting: Family Medicine

## 2018-08-09 VITALS — BP 125/70 | HR 82 | Temp 99.1°F | Wt 137.0 lb

## 2018-08-09 DIAGNOSIS — T148XXA Other injury of unspecified body region, initial encounter: Secondary | ICD-10-CM

## 2018-08-09 NOTE — Patient Instructions (Addendum)
Please let me know if you wound gets worse.   I will see you back whenever is convenient for you for your physical annual.

## 2018-08-09 NOTE — Assessment & Plan Note (Signed)
Doing well and nearly entire healed. No signs or symptoms of infection. Recommended continued home wound care and discussed precautions.

## 2018-08-09 NOTE — Progress Notes (Signed)
    Subjective:  Brian Leon is a 72 y.o. male who presents to the [facility] today with a chief complaint of for wound recheck.   HPI:  States has been doing well, using the nonstick dressings.  Wound seems to be improving. No fever, drainage, redness, pain  ROS: Per HPI   Objective:  Physical Exam: BP 125/70   Pulse 82   Temp 99.1 F (37.3 C) (Oral)   Wt 137 lb (62.1 kg)   SpO2 95%   BMI 19.66 kg/m   Gen: NAD, resting comfortably Pulm: NWOB  Skin: R shin wound with continued evidence of wound healing, small remaining areas of granulation tissue. See picture below. Neuro: grossly normal, moves all extremities Psych: Normal affect and thought content      Assessment/Plan:  Nonhealing nonsurgical wound Doing well and nearly entire healed. No signs or symptoms of infection. Recommended continued home wound care and discussed precautions.   Bufford Lope, DO PGY-3, [facility] 08/09/2018 1:41 PM

## 2018-08-15 ENCOUNTER — Telehealth: Payer: Self-pay | Admitting: Family Medicine

## 2018-08-15 NOTE — Telephone Encounter (Signed)
Left message for patient that the repeat imaging he had asked me about the last visit was actually a 1 year follow-up that he needed is for a incidental ascending thoracic aortic aneurysm found in 04/26/17.  There is a future order in for this.  Can patient please be scheduled for CTA and informed.  If patient has any questions I would be happy to discuss with him over the phone.

## 2018-08-16 NOTE — Telephone Encounter (Signed)
CT scheduled for 11/25 @3 :30p. Will attempt to call patient.   Had to LVM.

## 2018-08-16 NOTE — Telephone Encounter (Signed)
Pt informed of CT.

## 2018-08-22 ENCOUNTER — Ambulatory Visit (INDEPENDENT_AMBULATORY_CARE_PROVIDER_SITE_OTHER): Payer: Medicare Other | Admitting: Family Medicine

## 2018-08-22 ENCOUNTER — Encounter: Payer: Self-pay | Admitting: Family Medicine

## 2018-08-22 ENCOUNTER — Other Ambulatory Visit: Payer: Self-pay

## 2018-08-22 VITALS — BP 128/80 | HR 60 | Temp 98.3°F | Ht 70.0 in | Wt 138.0 lb

## 2018-08-22 DIAGNOSIS — D649 Anemia, unspecified: Secondary | ICD-10-CM | POA: Diagnosis not present

## 2018-08-22 DIAGNOSIS — Z1211 Encounter for screening for malignant neoplasm of colon: Secondary | ICD-10-CM

## 2018-08-22 DIAGNOSIS — K4090 Unilateral inguinal hernia, without obstruction or gangrene, not specified as recurrent: Secondary | ICD-10-CM

## 2018-08-22 DIAGNOSIS — Z0000 Encounter for general adult medical examination without abnormal findings: Secondary | ICD-10-CM | POA: Diagnosis not present

## 2018-08-22 DIAGNOSIS — D509 Iron deficiency anemia, unspecified: Secondary | ICD-10-CM | POA: Diagnosis not present

## 2018-08-22 DIAGNOSIS — E7800 Pure hypercholesterolemia, unspecified: Secondary | ICD-10-CM | POA: Diagnosis not present

## 2018-08-22 HISTORY — DX: Pure hypercholesterolemia, unspecified: E78.00

## 2018-08-22 MED ORDER — PREDNISONE 1 MG PO TABS: 4.0000 mg | ORAL_TABLET | Freq: Every day | ORAL | Status: DC

## 2018-08-22 NOTE — Patient Instructions (Addendum)
It was good to see you today!  Referral to [facility] gastroenterology placed today.  Referral to [facility] placed today.  We are checking some labs today. If results require attention, either myself or my nurse will get in touch with you. If everything is normal, you will get a letter in the mail or a message in My Chart. Please give [place] a call if you do not hear from [place] after 2 weeks.   Please bring all of your medications with you to each visit.   Sign up for My Chart to have easy access to your labs results, and communication with your primary care physician.  Feel free to call with any questions or concerns at any time, at [phone].   Take care,  Dr. Bufford Lope, [employer]

## 2018-08-22 NOTE — Assessment & Plan Note (Signed)
Recheck CBC and iron panel today.

## 2018-08-22 NOTE — Assessment & Plan Note (Signed)
Referred to GI for screening colonoscopy.  

## 2018-08-22 NOTE — Assessment & Plan Note (Signed)
Per chart review had hypercholesteremia in 2015. Recheck lipid panel today.
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
## 2018-08-22 NOTE — Assessment & Plan Note (Signed)
L indirect inguinal hernia causing discomfort. No concern for strangulation. Referred back to [facility] today.

## 2018-08-23 ENCOUNTER — Encounter: Payer: Self-pay | Admitting: Family Medicine

## 2018-08-23 LAB — LIPID PANEL
Chol/HDL Ratio: 3 ratio (ref 0.0–5.0)
Cholesterol, Total: 183 mg/dL (ref 100–199)
HDL: 62 mg/dL
LDL Calculated: 107 mg/dL — ABNORMAL HIGH (ref 0–99)
Triglycerides: 72 mg/dL (ref 0–149)
VLDL Cholesterol Cal: 14 mg/dL (ref 5–40)

## 2018-08-23 LAB — CBC WITH DIFFERENTIAL/PLATELET
Basophils Absolute: 0.1 10*3/uL (ref 0.0–0.2)
Basos: 1 %
EOS (ABSOLUTE): 0.6 10*3/uL — ABNORMAL HIGH (ref 0.0–0.4)
Eos: 5 %
Hematocrit: 37.9 % (ref 37.5–51.0)
Hemoglobin: 13 g/dL (ref 13.0–17.7)
Immature Grans (Abs): 0.1 10*3/uL (ref 0.0–0.1)
Immature Granulocytes: 1 %
Lymphocytes Absolute: 2.4 10*3/uL (ref 0.7–3.1)
Lymphs: 21 %
MCH: 32.8 pg (ref 26.6–33.0)
MCHC: 34.3 g/dL (ref 31.5–35.7)
MCV: 96 fL (ref 79–97)
Monocytes Absolute: 1.1 10*3/uL — ABNORMAL HIGH (ref 0.1–0.9)
Monocytes: 9 %
Neutrophils Absolute: 7.5 10*3/uL — ABNORMAL HIGH (ref 1.4–7.0)
Neutrophils: 63 %
Platelets: 443 10*3/uL (ref 150–450)
RBC: 3.96 x10E6/uL — ABNORMAL LOW (ref 4.14–5.80)
RDW: 12.1 % — ABNORMAL LOW (ref 12.3–15.4)
WBC: 11.8 10*3/uL — ABNORMAL HIGH (ref 3.4–10.8)

## 2018-08-23 LAB — IRON,TIBC AND FERRITIN PANEL
Ferritin: 103 ng/mL (ref 30–400)
Iron Saturation: 41 % (ref 15–55)
Iron: 110 ug/dL (ref 38–169)
Total Iron Binding Capacity: 270 ug/dL (ref 250–450)
UIBC: 160 ug/dL (ref 111–343)

## 2018-08-24 ENCOUNTER — Telehealth: Payer: Self-pay | Admitting: Rheumatology

## 2018-08-24 MED ORDER — PREDNISONE 1 MG PO TABS: 4.0000 mg | ORAL_TABLET | Freq: Every day | ORAL | 0 refills | Status: DC

## 2018-08-24 NOTE — Telephone Encounter (Signed)
Patient called stating he is tapering his prescription of Prednisone from 5 mg tablets to 4 mg today.  Patient is requesting prescription of Prednisone 1 mg tablets to be sent to [facility] on [address].

## 2018-08-24 NOTE — Telephone Encounter (Signed)
Last visit: 08/02/2018 Next visit: 11/08/2018  Patient is now on prednisone 4mg  po daily.   Okay to refill per Dr. Estanislado Pandy.   Left message on machine to advise patient prescription has been sent to the pharmacy.

## 2018-08-27 ENCOUNTER — Ambulatory Visit (HOSPITAL_COMMUNITY)
Admission: RE | Admit: 2018-08-27 | Discharge: 2018-08-27 | Disposition: A | Discharge status: Home / Self Care | Payer: Medicare Other | Source: Ambulatory Visit | Attending: Family Medicine | Admitting: Family Medicine

## 2018-08-27 DIAGNOSIS — I712 Thoracic aortic aneurysm, without rupture, unspecified: Secondary | ICD-10-CM

## 2018-08-27 DIAGNOSIS — R911 Solitary pulmonary nodule: Secondary | ICD-10-CM | POA: Diagnosis not present

## 2018-08-27 DIAGNOSIS — R918 Other nonspecific abnormal finding of lung field: Secondary | ICD-10-CM

## 2018-08-29 ENCOUNTER — Encounter: Payer: Self-pay | Admitting: Family Medicine

## 2018-08-29 NOTE — Progress Notes (Signed)
Called patient and left a message stating that his thoracic aortic aneurysm was stable without any concerns and follow-up annually.

## 2018-09-05 DIAGNOSIS — L2081 Atopic neurodermatitis: Secondary | ICD-10-CM | POA: Diagnosis not present

## 2018-09-05 DIAGNOSIS — H0011 Chalazion right upper eyelid: Secondary | ICD-10-CM | POA: Diagnosis not present

## 2018-09-05 DIAGNOSIS — Z79899 Other long term (current) drug therapy: Secondary | ICD-10-CM | POA: Diagnosis not present

## 2018-09-05 DIAGNOSIS — L281 Prurigo nodularis: Secondary | ICD-10-CM | POA: Diagnosis not present

## 2018-09-15 ENCOUNTER — Other Ambulatory Visit: Payer: Self-pay | Admitting: Rheumatology

## 2018-09-18 ENCOUNTER — Other Ambulatory Visit: Payer: Self-pay | Admitting: Rheumatology

## 2018-09-18 NOTE — Telephone Encounter (Signed)
Last Visit: 08/02/18 Next Visit: 11/08/18  Attempted to contact the patient and left message for patient to call the office to verify the dose of Prednisone.

## 2018-09-19 NOTE — Telephone Encounter (Signed)
Patient states he had a flare in which he had increased back to Prednisone 5 mg  And will decrease back to Prednisone 4 mg at the first of the year. Patient will call the office if he continues to flare to schedule a follow up appointment.

## 2018-10-05 DIAGNOSIS — N5201 Erectile dysfunction due to arterial insufficiency: Secondary | ICD-10-CM | POA: Diagnosis not present

## 2018-10-05 DIAGNOSIS — N210 Calculus in bladder: Secondary | ICD-10-CM | POA: Diagnosis not present

## 2018-10-05 DIAGNOSIS — N200 Calculus of kidney: Secondary | ICD-10-CM | POA: Diagnosis not present

## 2018-10-05 DIAGNOSIS — C641 Malignant neoplasm of right kidney, except renal pelvis: Secondary | ICD-10-CM | POA: Diagnosis not present

## 2018-10-05 DIAGNOSIS — R350 Frequency of micturition: Secondary | ICD-10-CM | POA: Diagnosis not present

## 2018-10-10 ENCOUNTER — Encounter: Payer: Self-pay | Admitting: Family Medicine

## 2018-10-10 DIAGNOSIS — N529 Male erectile dysfunction, unspecified: Secondary | ICD-10-CM

## 2018-10-10 HISTORY — DX: Male erectile dysfunction, unspecified: N52.9

## 2018-10-11 ENCOUNTER — Other Ambulatory Visit: Payer: Self-pay | Admitting: Rheumatology

## 2018-10-11 NOTE — Telephone Encounter (Addendum)
Last Visit: 08/02/18 Next Visit: 11/08/18 Labs: 03/14/18 WBC 12.5, Hgb 11.8  Patient advised he is due for labs. Patient will update 10/15/18.   Okay to refill 30 day supply per Dr. Estanislado Pandy

## 2018-10-15 ENCOUNTER — Other Ambulatory Visit: Payer: Self-pay | Admitting: *Deleted

## 2018-10-15 DIAGNOSIS — Z79899 Other long term (current) drug therapy: Secondary | ICD-10-CM | POA: Diagnosis not present

## 2018-10-16 LAB — CBC WITH DIFFERENTIAL/PLATELET
Absolute Monocytes: 891 cells/uL (ref 200–950)
Basophils Absolute: 55 cells/uL (ref 0–200)
Basophils Relative: 0.4 %
Eosinophils Absolute: 521 cells/uL — ABNORMAL HIGH (ref 15–500)
Eosinophils Relative: 3.8 %
HCT: 34.6 % — ABNORMAL LOW (ref 38.5–50.0)
Hemoglobin: 11.6 g/dL — ABNORMAL LOW (ref 13.2–17.1)
Lymphs Abs: 891 cells/uL (ref 850–3900)
MCH: 32.1 pg (ref 27.0–33.0)
MCHC: 33.5 g/dL (ref 32.0–36.0)
MCV: 95.8 fL (ref 80.0–100.0)
MPV: 9.2 fL (ref 7.5–12.5)
Monocytes Relative: 6.5 %
Neutro Abs: 11344 cells/uL — ABNORMAL HIGH (ref 1500–7800)
Neutrophils Relative %: 82.8 %
Platelets: 461 10*3/uL — ABNORMAL HIGH (ref 140–400)
RBC: 3.61 10*6/uL — ABNORMAL LOW (ref 4.20–5.80)
RDW: 11.6 % (ref 11.0–15.0)
Total Lymphocyte: 6.5 %
WBC: 13.7 10*3/uL — ABNORMAL HIGH (ref 3.8–10.8)

## 2018-10-16 LAB — COMPLETE METABOLIC PANEL WITH GFR
AG Ratio: 1.7 (calc) (ref 1.0–2.5)
ALT: 12 U/L (ref 9–46)
AST: 15 U/L (ref 10–35)
Albumin: 3.8 g/dL (ref 3.6–5.1)
Alkaline phosphatase (APISO): 67 U/L (ref 40–115)
BUN: 13 mg/dL (ref 7–25)
CO2: 24 mmol/L (ref 20–32)
Calcium: 9 mg/dL (ref 8.6–10.3)
Chloride: 106 mmol/L (ref 98–110)
Creat: 1.05 mg/dL (ref 0.70–1.18)
GFR, Est African American: 81 mL/min/{1.73_m2} (ref >=60)
GFR, Est Non African American: 70 mL/min/{1.73_m2} (ref >=60)
Globulin: 2.3 g/dL (calc) (ref 1.9–3.7)
Glucose, Bld: 82 mg/dL (ref 65–99)
Potassium: 4.2 mmol/L (ref 3.5–5.3)
Sodium: 137 mmol/L (ref 135–146)
Total Bilirubin: 0.4 mg/dL (ref 0.2–1.2)
Total Protein: 6.1 g/dL (ref 6.1–8.1)

## 2018-10-16 NOTE — Progress Notes (Signed)
Labs are stable.  WBC count is slightly elevated we will continue to monitor it.

## 2018-10-19 IMAGING — DX DG CHEST 2V
2 series · 2 of 2 positions shown · non-contrast
Comparison: Chest x-ray of 12/22/2015

CLINICAL DATA: Shortness of breath with exertion, cough, history of
prior right lung surgery

EXAM:
CHEST  2 VIEW

[dg chest 2 view (1 of 2)]
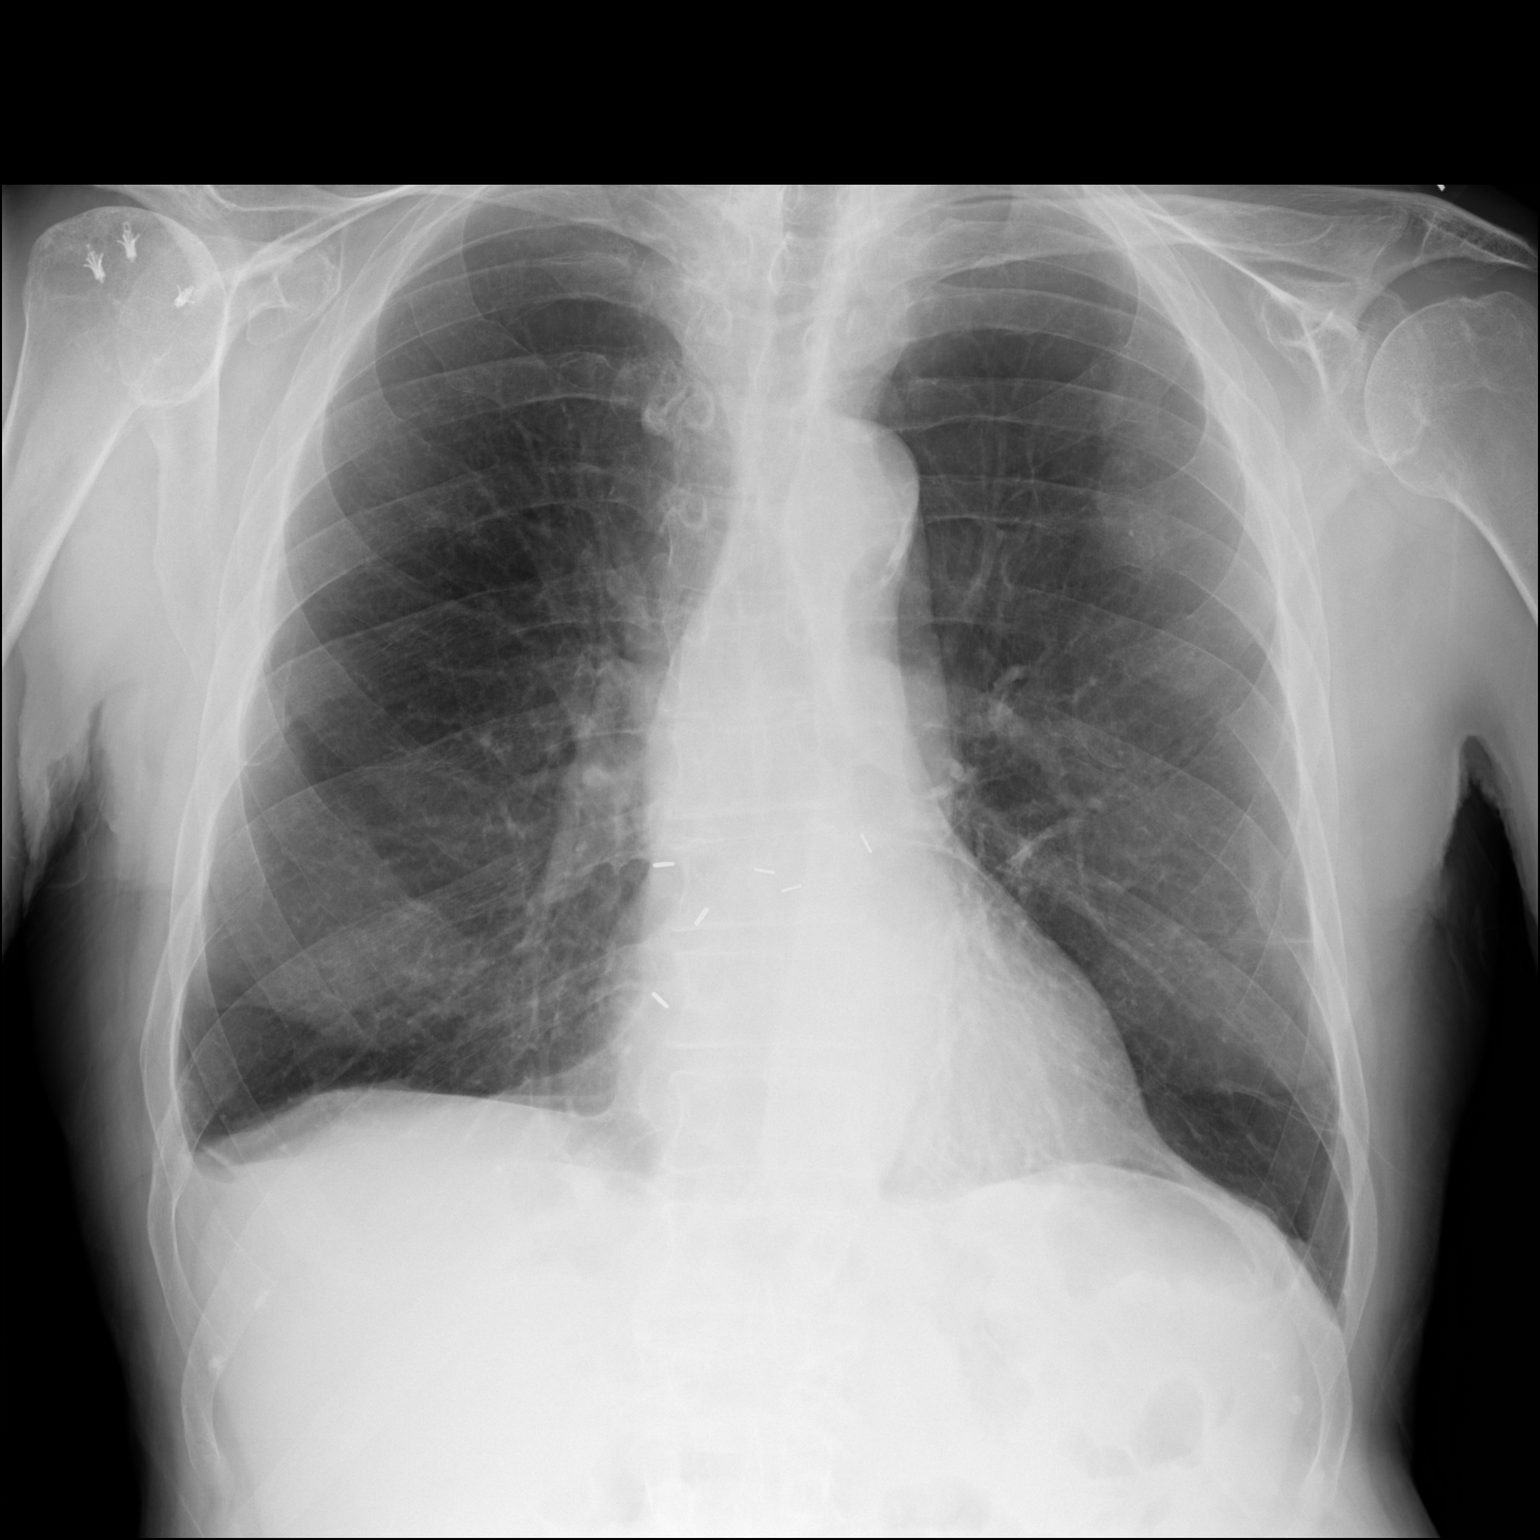

[dg chest 2 view (2 of 2)]
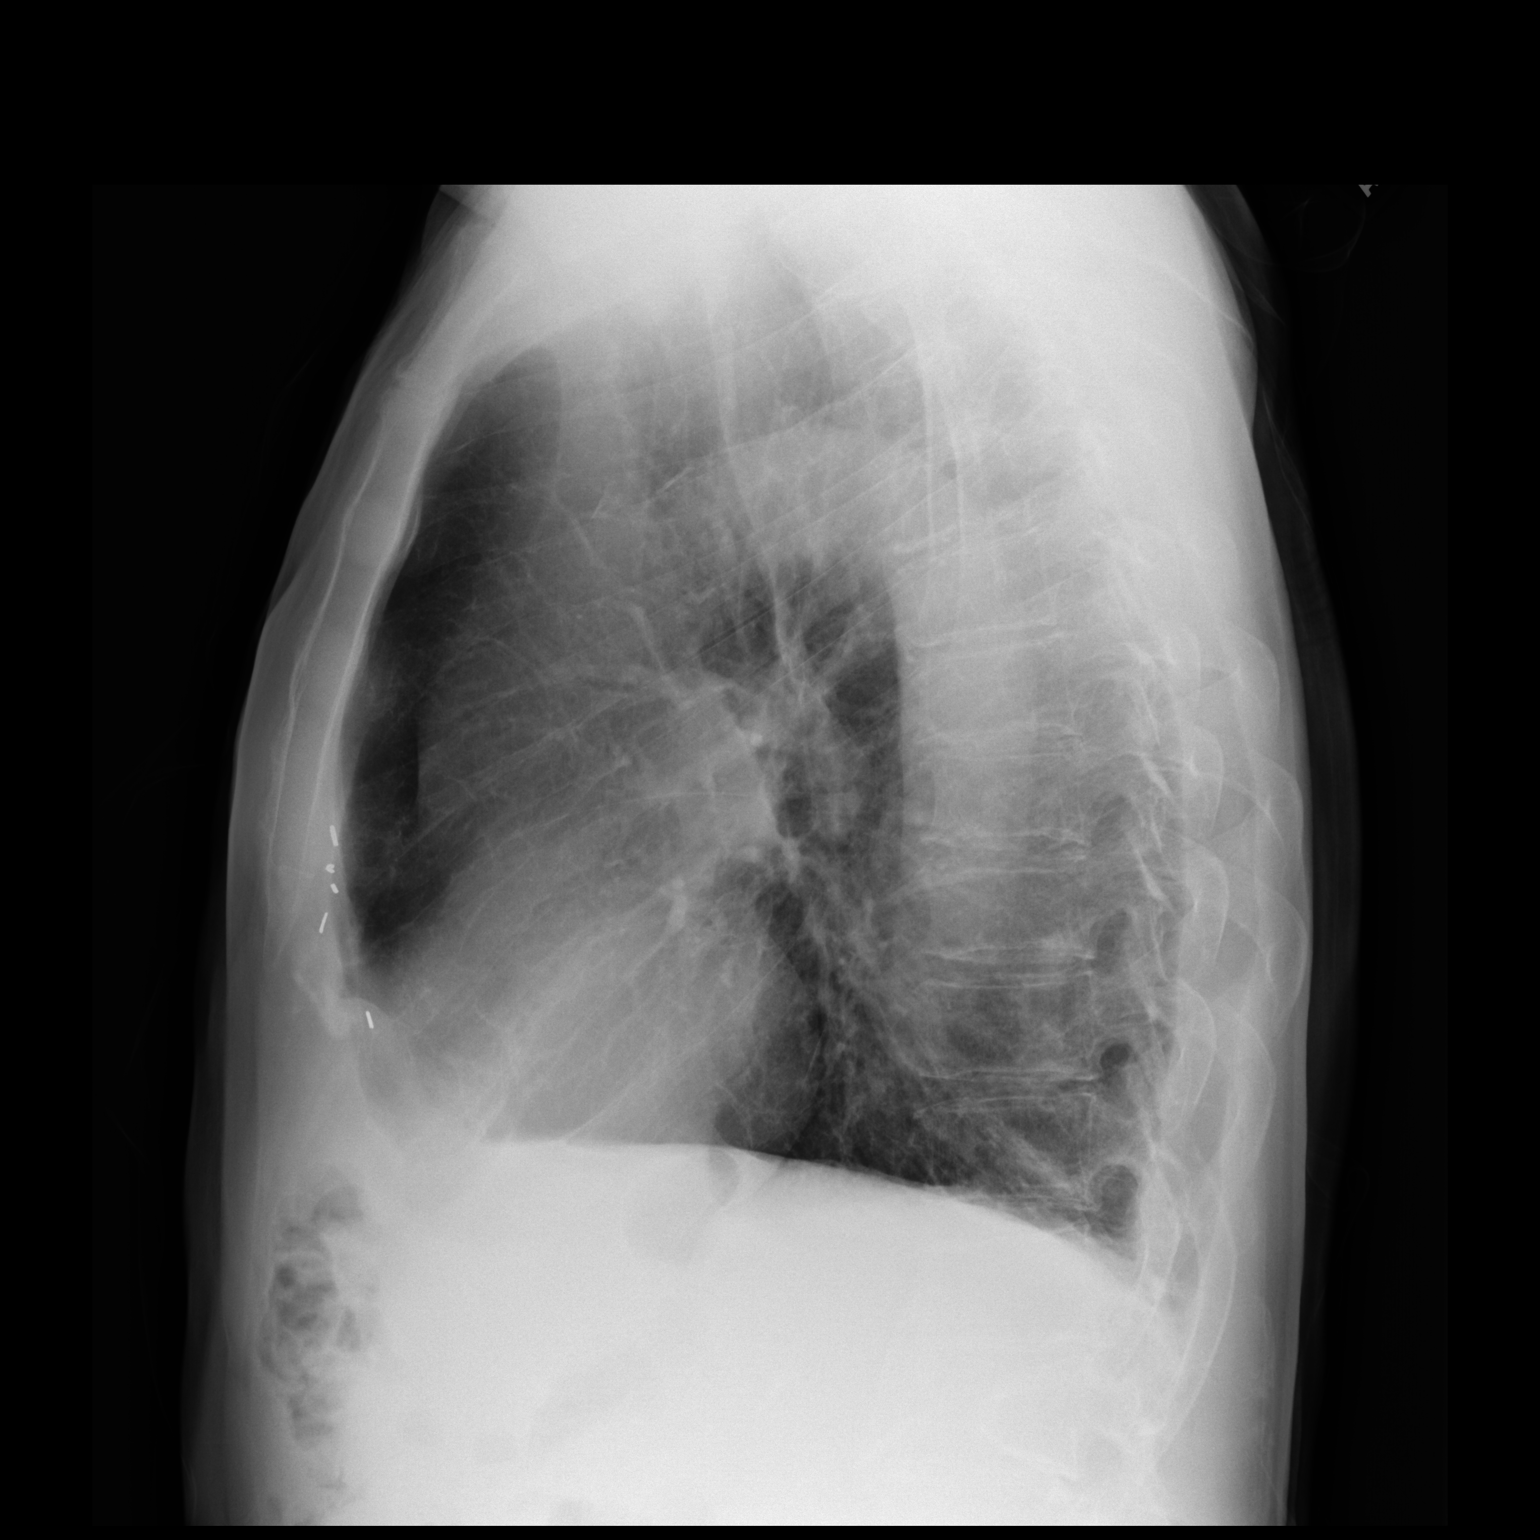

[2 of 2 positions shown; findings below may reference images not displayed]

FINDINGS: The lungs remain clear and somewhat hyperaerated. Mediastinal and
hilar contours are unremarkable. The heart is within normal limits
in size. Surgical clips overlie the lower anterior chest adjacent to
the pleura of questionable significance. No bony abnormality is
seen.
IMPRESSION: Hyperaeration.  No active lung disease.
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
## 2018-10-31 ENCOUNTER — Other Ambulatory Visit: Payer: Self-pay | Admitting: Family Medicine

## 2018-11-01 ENCOUNTER — Telehealth: Payer: Self-pay | Admitting: Rheumatology

## 2018-11-01 MED ORDER — PREDNISONE 5 MG PO TABS: 5.0000 mg | ORAL_TABLET | Freq: Every day | ORAL | 0 refills | Status: DC

## 2018-11-01 NOTE — Telephone Encounter (Signed)
Patient called requesting to talk to you directly.  Patient states he has questions regarding his medication.  Patient requested a return call.

## 2018-11-01 NOTE — Addendum Note (Signed)
Addended by: Earnestine Mealing on: 11/01/2018 04:50 PM   Modules accepted: Orders

## 2018-11-01 NOTE — Telephone Encounter (Signed)
Attempted to contact the patient and left message for patient to call the office.  

## 2018-11-01 NOTE — Telephone Encounter (Signed)
Patient states he went to 4 mg of Prednisone on 10/23/18 and the last few days he has noticed a decrease in strength, stamnia and has more trouble getting from sitting to standing position. Patient states he has noticed aching in his body. Patient would like to know if he should continue on the 4 mg of Prednisone or increase back to 5 mg. Patient was last seen on 08/02/18 and due to follow up on 11/08/18. Please advise.

## 2018-11-01 NOTE — Telephone Encounter (Signed)
Advised patient to increase prednisone back to 5mg  po daily and to contact the office if symptoms persist. Patient verbalized understanding and requested refill of 5mg  tablets. Prescription has been sent to the pharmacy.

## 2018-11-01 NOTE — Telephone Encounter (Signed)
He may increase prednisone back to 5 mg p.o. daily.  If his symptoms persist he should notify us.

## 2018-11-08 ENCOUNTER — Ambulatory Visit (INDEPENDENT_AMBULATORY_CARE_PROVIDER_SITE_OTHER): Payer: Medicare Other | Admitting: Rheumatology

## 2018-11-08 ENCOUNTER — Encounter: Payer: Self-pay | Admitting: Rheumatology

## 2018-11-08 VITALS — BP 149/88 | HR 62 | Resp 13 | Ht 69.5 in | Wt 138.0 lb

## 2018-11-08 DIAGNOSIS — Z872 Personal history of diseases of the skin and subcutaneous tissue: Secondary | ICD-10-CM

## 2018-11-08 DIAGNOSIS — Z8719 Personal history of other diseases of the digestive system: Secondary | ICD-10-CM | POA: Diagnosis not present

## 2018-11-08 DIAGNOSIS — R911 Solitary pulmonary nodule: Secondary | ICD-10-CM

## 2018-11-08 DIAGNOSIS — Z970 Presence of artificial eye: Secondary | ICD-10-CM

## 2018-11-08 DIAGNOSIS — M810 Age-related osteoporosis without current pathological fracture: Secondary | ICD-10-CM

## 2018-11-08 DIAGNOSIS — I10 Essential (primary) hypertension: Secondary | ICD-10-CM | POA: Diagnosis not present

## 2018-11-08 DIAGNOSIS — I712 Thoracic aortic aneurysm, without rupture, unspecified: Secondary | ICD-10-CM

## 2018-11-08 DIAGNOSIS — M5030 Other cervical disc degeneration, unspecified cervical region: Secondary | ICD-10-CM | POA: Diagnosis not present

## 2018-11-08 DIAGNOSIS — D473 Essential (hemorrhagic) thrombocythemia: Secondary | ICD-10-CM

## 2018-11-08 DIAGNOSIS — Z9889 Other specified postprocedural states: Secondary | ICD-10-CM | POA: Diagnosis not present

## 2018-11-08 DIAGNOSIS — Z7952 Long term (current) use of systemic steroids: Secondary | ICD-10-CM | POA: Diagnosis not present

## 2018-11-08 DIAGNOSIS — N132 Hydronephrosis with renal and ureteral calculous obstruction: Secondary | ICD-10-CM

## 2018-11-08 DIAGNOSIS — E559 Vitamin D deficiency, unspecified: Secondary | ICD-10-CM | POA: Diagnosis not present

## 2018-11-08 DIAGNOSIS — D75839 Thrombocytosis, unspecified: Secondary | ICD-10-CM

## 2018-11-08 DIAGNOSIS — M353 Polymyalgia rheumatica: Secondary | ICD-10-CM | POA: Diagnosis not present

## 2018-11-09 ENCOUNTER — Other Ambulatory Visit: Payer: Self-pay | Admitting: Rheumatology

## 2018-11-09 NOTE — Telephone Encounter (Signed)
Last Visit: 11/08/18 Next Visit: 04/11/19 Labs: 10/15/18 stable WBC count is slightly   Okay to refill per Dr. Estanislado Pandy

## 2018-12-10 ENCOUNTER — Telehealth: Payer: Self-pay | Admitting: Rheumatology

## 2018-12-10 MED ORDER — PREDNISONE 5 MG PO TABS: 5.0000 mg | ORAL_TABLET | Freq: Every day | ORAL | 0 refills | Status: DC

## 2018-12-10 NOTE — Telephone Encounter (Signed)
Patient called requesting prescription refill of Prednisone 5 mg tablets (one month supply) sent to [facility] at [address] in [place].

## 2018-12-10 NOTE — Telephone Encounter (Signed)
Last Visit: 11/08/18 Next Visit: 04/11/19   Okay to refill per Dr. Estanislado Pandy

## 2018-12-17 ENCOUNTER — Other Ambulatory Visit: Payer: Self-pay

## 2018-12-17 MED ORDER — FLUTICASONE PROPIONATE 50 MCG/ACT NA SUSP: 2.0000 | Freq: Every day | NASAL | 4 refills | Status: DC

## 2019-01-14 ENCOUNTER — Telehealth: Payer: Self-pay | Admitting: Rheumatology

## 2019-01-14 MED ORDER — PREDNISONE 5 MG PO TABS: 5.0000 mg | ORAL_TABLET | Freq: Every day | ORAL | 0 refills | Status: DC

## 2019-01-14 NOTE — Telephone Encounter (Signed)
Patient called requesting prescription refill of Prednisone 5 mg tablets to [facility] [address]  Patient states Dr. Estanislado Pandy discussed decreasing his Prednisone from 5 to 4 mg, but would like to stay with 5 mg until the Coronavirus is over.

## 2019-01-14 NOTE — Telephone Encounter (Signed)
Spoke directly with Dr. Estanislado Pandy about this and she is okay with patient staying on 5 mg until the coronavirus is over. Patient is aware and requested a refill of 5mg  tablets.   Last Visit: 11/08/2018 Next Visit: 04/11/2019  Okay to refill per Dr. Estanislado Pandy.

## 2019-01-29 ENCOUNTER — Other Ambulatory Visit: Payer: Self-pay | Admitting: Family Medicine

## 2019-01-29 DIAGNOSIS — R05 Cough: Secondary | ICD-10-CM

## 2019-01-29 DIAGNOSIS — R058 Other specified cough: Secondary | ICD-10-CM

## 2019-02-11 ENCOUNTER — Other Ambulatory Visit: Payer: Self-pay

## 2019-02-11 ENCOUNTER — Encounter: Payer: Self-pay | Admitting: Family Medicine

## 2019-02-11 ENCOUNTER — Telehealth: Payer: Medicare Other | Admitting: Family Medicine

## 2019-02-11 ENCOUNTER — Ambulatory Visit (INDEPENDENT_AMBULATORY_CARE_PROVIDER_SITE_OTHER): Payer: Medicare Other | Admitting: Family Medicine

## 2019-02-11 VITALS — BP 134/68 | Temp 98.8°F

## 2019-02-11 DIAGNOSIS — T148XXA Other injury of unspecified body region, initial encounter: Secondary | ICD-10-CM

## 2019-02-11 MED ORDER — CEPHALEXIN 500 MG PO CAPS: 500.0000 mg | ORAL_CAPSULE | Freq: Four times a day (QID) | ORAL | 0 refills | Status: AC

## 2019-02-11 NOTE — Assessment & Plan Note (Addendum)
Unclear etiology. No physical or systemic signs of infection, does not tunnel. Does not appear to overlay bone, no need for imaging at this time. History of difficulty healing 2/2 chronic immunosuppression. Also with prurigo nodularis, could be related although not pruritic and no reported scratching. Unlikely cancerous given prior h/o trauma. No current red flags. Encouraged to f/u with dermatology, may benefit from biopsy. Rx for antibiotic given for worsened redness, swelling, pain.

## 2019-02-11 NOTE — Progress Notes (Addendum)
  Subjective:   Patient ID: Brian Leon    DOB: [birth year], 73 y.o. male   MRN: [id number]  Brian Leon is a 73 y.o. male with a history of polymyalgia rheumatica, prurigo nodularis, IDA, BPH, ED, HLD, thoracic aortic aneurysm, DDD here for   Sore on shin Discussed in telemedicine visit earlier today, see for more extensive HPI. Essentially, h/o R shin wound sustained from yard work previously healed, states it is now back for the past 5 weeks with swelling. Previously had overlying scab.  Review of Systems:  Per HPI.  [place], medications and smoking status reviewed.  Objective:   BP 134/68   Temp 98.8 F (37.1 C) (Oral)  Vitals and nursing note reviewed.  General: well nourished, well developed, in no acute distress with non-toxic appearance Skin: warm, dry. ~1cm in diameter dry cratered lesion to anterior shin without bleeding, purulence, overlying scab. Does not tunnel. Tender with deep palpation. Not over bone. Palpable DP pulse. Extremities: warm and well perfused, normal tone MSK: ROM grossly intact, strength intact, gait normal Neuro: Alert and oriented, speech normal  Assessment & Plan:   Nonhealing nonsurgical wound Unclear etiology. No physical or systemic signs of infection, does not tunnel. Does not appear to overlay bone, no need for imaging at this time. History of difficulty healing 2/2 chronic immunosuppression. Also with prurigo nodularis, could be related although not pruritic and no reported scratching. Unlikely cancerous given prior h/o trauma. No current red flags. Encouraged to f/u with dermatology, may benefit from biopsy. Rx for antibiotic given for worsened redness, swelling, pain.  No orders of the defined types were placed in this encounter.  Meds ordered this encounter  Medications  . cephALEXin (KEFLEX) 500 MG capsule    Sig: Take 1 capsule (500 mg total) by mouth 4 (four) times daily for 5 days.    Dispense:  20 capsule    Refill:  0    Rory Percy, DO PGY-2, [facility] 02/11/2019 2:58 PM

## 2019-02-11 NOTE — Patient Instructions (Addendum)
It was great to see you!  Our plans for today:  - Don't take the antibiotic unless the tenderness, redness, or swelling gets worse or if it gets purulence. If you do take the antibiotic, let us know. - Make an appointment with your Dermatologist. - Continue to use bandages as you have been doing.  Take care and seek immediate care sooner if you develop any concerns.   Dr. [facility]

## 2019-02-11 NOTE — Progress Notes (Signed)
[place] Telemedicine Visit  Patient consented to have virtual visit. Method of visit: Video was attempted, but technology challenges prevented patient from using video, so visit was conducted via telephone.  Encounter participants: Patient: Brian Leon - located at home Provider: Rory Percy - located at [facility] Others (if applicable): n/a  Chief Complaint: sore on shin  HPI:  Patient with h/o R shin postsurgical wound previously healed, states it is now back for the past 3 weeks.. It is about the size of a dime with surrounding erythema about 2-3 inches across. Previously had some swelling around it but has gone down some with bandaging. He is concerned it might be infected. Denies fevers, discharge, odor. He has not tried medication for this but states mupirocin is "like putting ice cream on it." It is painful, so much that he cannot put a blanket on it. It does not itch. No rashes. He has been cleaning it with soap and water. No bruising or bleeding. Denies recent injury or trauma. Still taking prednisone and dupixent.  ROS: per HPI  Pertinent PMHx: prurigo nodularis, polymyalgia rheumatica w/ chronic steroid use, DDD, osteoporosis  Exam:  Respiratory: no resp distress, speaks in full sentences  Assessment/Plan:  Nonhealing nonsurgical wound Reemerged per patient with what sounds to be surrounding cellulitis. Hard to fully appreciate, diagnose, and manage without ability to see wound. Given chronic immunosuppressant therapy and h/o poor wound healing, instructed to come in for an in-person visit, appointment made this afternoon.    Time spent during visit with patient: 18 minutes

## 2019-02-11 NOTE — Assessment & Plan Note (Signed)
Reemerged per patient with what sounds to be surrounding cellulitis. Hard to fully appreciate, diagnose, and manage without ability to see wound. Given chronic immunosuppressant therapy and h/o poor wound healing, instructed to come in for an in-person visit, appointment made this afternoon.

## 2019-02-18 ENCOUNTER — Telehealth: Payer: Self-pay | Admitting: Rheumatology

## 2019-02-18 MED ORDER — PREDNISONE 5 MG PO TABS: 5.0000 mg | ORAL_TABLET | Freq: Every day | ORAL | 0 refills | Status: DC

## 2019-02-18 NOTE — Telephone Encounter (Signed)
Patient left a voicemail requesting prescription refill of Prednisone and to speak with you directly regarding the medication.

## 2019-02-18 NOTE — Telephone Encounter (Signed)
Patient requesting a refill on Prednisone.   Last Visit: 11/08/2018 Next Visit: 04/11/2019  Okay to refill per Dr. Estanislado Pandy

## 2019-03-05 DIAGNOSIS — Z79899 Other long term (current) drug therapy: Secondary | ICD-10-CM | POA: Diagnosis not present

## 2019-03-05 DIAGNOSIS — L2081 Atopic neurodermatitis: Secondary | ICD-10-CM | POA: Diagnosis not present

## 2019-03-05 DIAGNOSIS — C44722 Squamous cell carcinoma of skin of right lower limb, including hip: Secondary | ICD-10-CM | POA: Diagnosis not present

## 2019-03-05 DIAGNOSIS — L281 Prurigo nodularis: Secondary | ICD-10-CM | POA: Diagnosis not present

## 2019-03-05 DIAGNOSIS — L570 Actinic keratosis: Secondary | ICD-10-CM | POA: Diagnosis not present

## 2019-03-05 DIAGNOSIS — D492 Neoplasm of unspecified behavior of bone, soft tissue, and skin: Secondary | ICD-10-CM | POA: Diagnosis not present

## 2019-03-20 ENCOUNTER — Ambulatory Visit (INDEPENDENT_AMBULATORY_CARE_PROVIDER_SITE_OTHER): Payer: Medicare Other | Admitting: Physician Assistant

## 2019-03-20 ENCOUNTER — Ambulatory Visit (INDEPENDENT_AMBULATORY_CARE_PROVIDER_SITE_OTHER): Payer: Medicare Other

## 2019-03-20 ENCOUNTER — Other Ambulatory Visit: Payer: Self-pay

## 2019-03-20 ENCOUNTER — Encounter: Payer: Self-pay | Admitting: Physician Assistant

## 2019-03-20 ENCOUNTER — Ambulatory Visit: Payer: Self-pay

## 2019-03-20 VITALS — BP 171/97 | HR 82 | Resp 12 | Ht 69.5 in | Wt 135.0 lb

## 2019-03-20 DIAGNOSIS — M79641 Pain in right hand: Secondary | ICD-10-CM

## 2019-03-20 DIAGNOSIS — I712 Thoracic aortic aneurysm, without rupture, unspecified: Secondary | ICD-10-CM

## 2019-03-20 DIAGNOSIS — M810 Age-related osteoporosis without current pathological fracture: Secondary | ICD-10-CM | POA: Diagnosis not present

## 2019-03-20 DIAGNOSIS — Z872 Personal history of diseases of the skin and subcutaneous tissue: Secondary | ICD-10-CM

## 2019-03-20 DIAGNOSIS — M79642 Pain in left hand: Secondary | ICD-10-CM

## 2019-03-20 DIAGNOSIS — Z7952 Long term (current) use of systemic steroids: Secondary | ICD-10-CM

## 2019-03-20 DIAGNOSIS — I10 Essential (primary) hypertension: Secondary | ICD-10-CM | POA: Diagnosis not present

## 2019-03-20 DIAGNOSIS — M353 Polymyalgia rheumatica: Secondary | ICD-10-CM

## 2019-03-20 DIAGNOSIS — D473 Essential (hemorrhagic) thrombocythemia: Secondary | ICD-10-CM | POA: Diagnosis not present

## 2019-03-20 DIAGNOSIS — Z8719 Personal history of other diseases of the digestive system: Secondary | ICD-10-CM

## 2019-03-20 DIAGNOSIS — D75839 Thrombocytosis, unspecified: Secondary | ICD-10-CM

## 2019-03-20 DIAGNOSIS — Z9889 Other specified postprocedural states: Secondary | ICD-10-CM

## 2019-03-20 DIAGNOSIS — E559 Vitamin D deficiency, unspecified: Secondary | ICD-10-CM

## 2019-03-20 DIAGNOSIS — R911 Solitary pulmonary nodule: Secondary | ICD-10-CM

## 2019-03-20 DIAGNOSIS — M5030 Other cervical disc degeneration, unspecified cervical region: Secondary | ICD-10-CM

## 2019-03-20 MED ORDER — PREDNISONE 5 MG PO TABS: 5.0000 mg | ORAL_TABLET | Freq: Every day | ORAL | 0 refills | Status: DC
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
## 2019-03-28 LAB — RHEUMATOID FACTOR: Rheumatoid fact SerPl-aCnc: <14 IU/mL (ref <14)

## 2019-03-28 LAB — SEDIMENTATION RATE: Sed Rate: 9 mm/h (ref 0–20)

## 2019-03-28 LAB — 14-3-3 ETA PROTEIN: 14-3-3 eta Protein: <0.2 ng/mL (ref <0.2)

## 2019-03-28 LAB — URIC ACID: Uric Acid, Serum: 4.4 mg/dL (ref 4.0–8.0)

## 2019-03-28 LAB — CYCLIC CITRUL PEPTIDE ANTIBODY, IGG: Cyclic Citrullin Peptide Ab: <16 UNITS
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
## 2019-03-29 NOTE — Progress Notes (Signed)
Uric acid within desirable range.  CCP, 14-3-3 eta, and RF are negative.  Sed rate WNL.

## 2019-04-01 ENCOUNTER — Telehealth: Payer: Self-pay | Admitting: Rheumatology

## 2019-04-01 NOTE — Telephone Encounter (Signed)
See lab results for note.  

## 2019-04-01 NOTE — Telephone Encounter (Signed)
Patient left a voicemail stating he was returning your call. °

## 2019-04-02 ENCOUNTER — Ambulatory Visit: Payer: Self-pay | Admitting: General Surgery

## 2019-04-02 DIAGNOSIS — K4090 Unilateral inguinal hernia, without obstruction or gangrene, not specified as recurrent: Secondary | ICD-10-CM | POA: Diagnosis not present

## 2019-04-02 NOTE — Progress Notes (Signed)
We can schedule an ultrasound of both hands to assess for synovitis if his hand pain and swelling is persistent or worsening.

## 2019-04-03 ENCOUNTER — Ambulatory Visit (INDEPENDENT_AMBULATORY_CARE_PROVIDER_SITE_OTHER): Payer: Medicare Other | Admitting: Rheumatology

## 2019-04-03 ENCOUNTER — Other Ambulatory Visit: Payer: Self-pay

## 2019-04-03 ENCOUNTER — Ambulatory Visit: Payer: Self-pay

## 2019-04-03 DIAGNOSIS — M79641 Pain in right hand: Secondary | ICD-10-CM | POA: Diagnosis not present

## 2019-04-03 DIAGNOSIS — M79642 Pain in left hand: Secondary | ICD-10-CM

## 2019-04-04 DIAGNOSIS — C44722 Squamous cell carcinoma of skin of right lower limb, including hip: Secondary | ICD-10-CM | POA: Diagnosis not present

## 2019-04-11 ENCOUNTER — Ambulatory Visit: Payer: Self-pay | Admitting: Rheumatology

## 2019-04-18 DIAGNOSIS — Z4802 Encounter for removal of sutures: Secondary | ICD-10-CM | POA: Diagnosis not present

## 2019-04-22 ENCOUNTER — Telehealth: Payer: Self-pay | Admitting: Rheumatology

## 2019-04-22 MED ORDER — PREDNISONE 5 MG PO TABS: 5.0000 mg | ORAL_TABLET | Freq: Every day | ORAL | 0 refills | Status: DC

## 2019-04-22 NOTE — Telephone Encounter (Signed)
Patient called requesting prescription refill of Prednisone to be sent to [facility] [address] in [place].

## 2019-04-22 NOTE — Telephone Encounter (Signed)
Last Visit: 04/03/2019  Next Visit: message sent to the front desk to schedule.   Okay to refill per Dr. Estanislado Pandy.

## 2019-05-06 ENCOUNTER — Other Ambulatory Visit (HOSPITAL_COMMUNITY)
Admission: RE | Admit: 2019-05-06 | Discharge: 2019-05-06 | Disposition: A | Discharge status: Home / Self Care | Payer: Medicare Other | Source: Ambulatory Visit | Attending: General Surgery | Admitting: General Surgery

## 2019-05-06 ENCOUNTER — Other Ambulatory Visit (HOSPITAL_COMMUNITY): Payer: Self-pay | Admitting: *Deleted

## 2019-05-06 DIAGNOSIS — Z01812 Encounter for preprocedural laboratory examination: Secondary | ICD-10-CM | POA: Insufficient documentation

## 2019-05-06 DIAGNOSIS — Z20828 Contact with and (suspected) exposure to other viral communicable diseases: Secondary | ICD-10-CM | POA: Diagnosis not present

## 2019-05-06 LAB — SARS CORONAVIRUS 2 (TAT 6-24 HRS): SARS Coronavirus 2: NEGATIVE

## 2019-05-06 NOTE — Patient Instructions (Addendum)
YOU HAD  A COVID 19 TEST ON 05-06-2019. ONCE YOUR COVID TEST IS COMPLETED, PLEASE BEGIN THE QUARANTINE INSTRUCTIONS AS OUTLINED IN YOUR HANDOUT.                Brian Leon    Your procedure is scheduled on: 05-09-2019   Report to [facility] Main  Entrance    Report to admitting at 7:45 AM   1 VISITOR IS ALLOWED TO WAIT IN WAITING ROOM  ONLY DAY OF YOUR SURGERY.    Call this number if you have problems the morning of surgery [phone]    Remember:  NO SOLID FOOD AFTER MIDNIGHT THE NIGHT PRIOR TO SURGERY. NOTHING BY MOUTH EXCEPT CLEAR LIQUIDS UNTIL 6:45 AM. PLEASE FINISH ENSURE DRINK PER SURGEON ORDER 3 HOURS PRIOR TO SCHEDULED SURGERY TIME WHICH NEEDS TO BE COMPLETED AT 6:45 AM   CLEAR LIQUID DIET   Foods Allowed                                                                     Foods Excluded  Coffee and tea, regular and decaf                             liquids that you cannot  Plain Jell-O any favor except red or purple                                           see through such as: Fruit ices (not with fruit pulp)                                     milk, soups, orange juice  Iced Popsicles                                    All solid food Carbonated beverages, regular and diet                                    Cranberry, grape and apple juices Sports drinks like Gatorade Lightly seasoned clear broth or consume(fat free) Sugar, honey syrup  Sample Menu Breakfast                                Lunch                                     Supper Cranberry juice                    Beef broth                            Chicken broth Jell-O  Grape juice                           Apple juice Coffee or tea                        Jell-O                                      Popsicle                                                Coffee or tea                        Coffee or  tea  _____________________________________________________________________     Take these medicines the morning of surgery with A SIP OF WATER: Pantoprazole (Protonix). You can also use and bring your inhaler.            BRUSH YOUR TEETH MORNING OF SURGERY AND RINSE YOUR MOUTH OUT, NO CHEWING GUM CANDY OR MINTS.                      You may not have any metal on your body including hair pins and              piercings     Do not wear jewelry, cologne, lotions, powders or deodorant                          Men may shave face and neck.   Do not bring valuables to the hospital. [place].  Contacts, dentures or bridgework may not be worn into surgery.       _____________________________________________________________________             [facility] - Preparing for Surgery Before surgery, you can play an important role.  Because skin is not sterile, your skin needs to be as free of germs as possible.  You can reduce the number of germs on your skin by washing with CHG (chlorahexidine gluconate) soap before surgery.  CHG is an antiseptic cleaner which kills germs and bonds with the skin to continue killing germs even after washing. Please DO NOT use if you have an allergy to CHG or antibacterial soaps.  If your skin becomes reddened/irritated stop using the CHG and inform your nurse when you arrive at Short Stay. Do not shave (including legs and underarms) for at least 48 hours prior to the first CHG shower.  You may shave your face/neck. Please follow these instructions carefully:  1.  Shower with CHG Soap the night before surgery and the  morning of Surgery.  2.  If you choose to wash your hair, wash your hair first as usual with your  normal  shampoo.  3.  After you shampoo, rinse your hair and body thoroughly to remove the  shampoo.                           4.  Use  CHG as you would any other liquid soap.  You can apply chg directly   to the skin and wash                       Gently with a scrungie or clean washcloth.  5.  Apply the CHG Soap to your body ONLY FROM THE NECK DOWN.   Do not use on face/ open                           Wound or open sores. Avoid contact with eyes, ears mouth and genitals (private parts).                       Wash face,  Genitals (private parts) with your normal soap.             6.  Wash thoroughly, paying special attention to the area where your surgery  will be performed.  7.  Thoroughly rinse your body with warm water from the neck down.  8.  DO NOT shower/wash with your normal soap after using and rinsing off  the CHG Soap.                9.  Pat yourself dry with a clean towel.            10.  Wear clean pajamas.            11.  Place clean sheets on your bed the night of your first shower and do not  sleep with pets. Day of Surgery : Do not apply any lotions/deodorants the morning of surgery.  Please wear clean clothes to the hospital/surgery center.  FAILURE TO FOLLOW THESE INSTRUCTIONS MAY RESULT IN THE CANCELLATION OF YOUR SURGERY PATIENT SIGNATURE_________________________________  NURSE SIGNATURE__________________________________  ________________________________________________________________________   Brian Leon  An incentive spirometer is a tool that can help keep your lungs clear and active. This tool measures how well you are filling your lungs with each breath. Taking long deep breaths may help reverse or decrease the chance of developing breathing (pulmonary) problems (especially infection) following:  A long period of time when you are unable to move or be active. BEFORE THE PROCEDURE   If the spirometer includes an indicator to show your best effort, your nurse or respiratory therapist will set it to a desired goal.  If possible, sit up straight or lean slightly forward. Try not to slouch.  Hold the incentive spirometer in an upright  position. INSTRUCTIONS FOR USE  1. Sit on the edge of your bed if possible, or sit up as far as you can in bed or on a chair. 2. Hold the incentive spirometer in an upright position. 3. Breathe out normally. 4. Place the mouthpiece in your mouth and seal your lips tightly around it. 5. Breathe in slowly and as deeply as possible, raising the piston or the ball toward the top of the column. 6. Hold your breath for 3-5 seconds or for as long as possible. Allow the piston or ball to fall to the bottom of the column. 7. Remove the mouthpiece from your mouth and breathe out normally. 8. Rest for a few seconds and repeat Steps 1 through 7 at least 10 times every 1-2 hours when you are awake. Take your time and take a few normal breaths between deep breaths. 9. The spirometer may include an indicator to show your best  effort. Use the indicator as a goal to work toward during each repetition. 10. After each set of 10 deep breaths, practice coughing to be sure your lungs are clear. If you have an incision (the cut made at the time of surgery), support your incision when coughing by placing a pillow or rolled up towels firmly against it. Once you are able to get out of bed, walk around indoors and cough well. You may stop using the incentive spirometer when instructed by your caregiver.  RISKS AND COMPLICATIONS  Take your time so you do not get dizzy or light-headed.  If you are in pain, you may need to take or ask for pain medication before doing incentive spirometry. It is harder to take a deep breath if you are having pain. AFTER USE  Rest and breathe slowly and easily.  It can be helpful to keep track of a log of your progress. Your caregiver can provide you with a simple table to help with this. If you are using the spirometer at home, follow these instructions: Brian Leon IF:   You are having difficultly using the spirometer.  You have trouble using the spirometer as often as  instructed.  Your pain medication is not giving enough relief while using the spirometer.  You develop fever of 100.5 F (38.1 C) or higher. SEEK IMMEDIATE MEDICAL CARE IF:   You cough up bloody sputum that had not been present before.  You develop fever of 102 F (38.9 C) or greater.  You develop worsening pain at or near the incision site. MAKE SURE YOU:   Understand these instructions.  Will watch your condition.  Will get help right away if you are not doing well or get worse. Document Released: 01/30/2007 Document Revised: 12/12/2011 Document Reviewed: 04/02/2007 [facility] Patient Information 2014 [place], [place].   ________________________________________________________________________

## 2019-05-07 ENCOUNTER — Encounter (HOSPITAL_COMMUNITY)
Admission: RE | Admit: 2019-05-07 | Discharge: 2019-05-07 | Disposition: A | Discharge status: Home / Self Care | Payer: Medicare Other | Source: Ambulatory Visit | Attending: General Surgery | Admitting: General Surgery

## 2019-05-07 ENCOUNTER — Encounter (HOSPITAL_COMMUNITY): Payer: Self-pay

## 2019-05-07 ENCOUNTER — Other Ambulatory Visit: Payer: Self-pay

## 2019-05-07 DIAGNOSIS — K219 Gastro-esophageal reflux disease without esophagitis: Secondary | ICD-10-CM | POA: Diagnosis not present

## 2019-05-07 DIAGNOSIS — Z981 Arthrodesis status: Secondary | ICD-10-CM | POA: Diagnosis not present

## 2019-05-07 DIAGNOSIS — M1990 Unspecified osteoarthritis, unspecified site: Secondary | ICD-10-CM | POA: Diagnosis not present

## 2019-05-07 DIAGNOSIS — Z85528 Personal history of other malignant neoplasm of kidney: Secondary | ICD-10-CM | POA: Diagnosis not present

## 2019-05-07 DIAGNOSIS — Z882 Allergy status to sulfonamides status: Secondary | ICD-10-CM | POA: Diagnosis not present

## 2019-05-07 DIAGNOSIS — K4090 Unilateral inguinal hernia, without obstruction or gangrene, not specified as recurrent: Secondary | ICD-10-CM | POA: Diagnosis not present

## 2019-05-07 DIAGNOSIS — Z905 Acquired absence of kidney: Secondary | ICD-10-CM | POA: Diagnosis not present

## 2019-05-07 DIAGNOSIS — Z886 Allergy status to analgesic agent status: Secondary | ICD-10-CM | POA: Diagnosis not present

## 2019-05-07 DIAGNOSIS — J45909 Unspecified asthma, uncomplicated: Secondary | ICD-10-CM | POA: Diagnosis not present

## 2019-05-07 DIAGNOSIS — Z87891 Personal history of nicotine dependence: Secondary | ICD-10-CM | POA: Diagnosis not present

## 2019-05-07 DIAGNOSIS — M810 Age-related osteoporosis without current pathological fracture: Secondary | ICD-10-CM | POA: Diagnosis not present

## 2019-05-07 DIAGNOSIS — Z7983 Long term (current) use of bisphosphonates: Secondary | ICD-10-CM | POA: Diagnosis not present

## 2019-05-07 DIAGNOSIS — Z7951 Long term (current) use of inhaled steroids: Secondary | ICD-10-CM | POA: Diagnosis not present

## 2019-05-07 DIAGNOSIS — Z79899 Other long term (current) drug therapy: Secondary | ICD-10-CM | POA: Diagnosis not present

## 2019-05-07 DIAGNOSIS — I10 Essential (primary) hypertension: Secondary | ICD-10-CM | POA: Diagnosis not present

## 2019-05-07 DIAGNOSIS — Z7952 Long term (current) use of systemic steroids: Secondary | ICD-10-CM | POA: Diagnosis not present

## 2019-05-07 LAB — BASIC METABOLIC PANEL
Anion gap: 7 (ref 5–15)
BUN: 16 mg/dL (ref 8–23)
CO2: 25 mmol/L (ref 22–32)
Calcium: 9 mg/dL (ref 8.9–10.3)
Chloride: 107 mmol/L (ref 98–111)
Creatinine, Ser: 0.97 mg/dL (ref 0.61–1.24)
GFR calc Af Amer: >60 mL/min (ref >60)
GFR calc non Af Amer: >60 mL/min (ref >60)
Glucose, Bld: 89 mg/dL (ref 70–99)
Potassium: 3.9 mmol/L (ref 3.5–5.1)
Sodium: 139 mmol/L (ref 135–145)

## 2019-05-07 LAB — CBC
HCT: 37.9 % — ABNORMAL LOW (ref 39.0–52.0)
Hemoglobin: 12.2 g/dL — ABNORMAL LOW (ref 13.0–17.0)
MCH: 32 pg (ref 26.0–34.0)
MCHC: 32.2 g/dL (ref 30.0–36.0)
MCV: 99.5 fL (ref 80.0–100.0)
Platelets: 355 10*3/uL (ref 150–400)
RBC: 3.81 MIL/uL — ABNORMAL LOW (ref 4.22–5.81)
RDW: 13.3 % (ref 11.5–15.5)
WBC: 10.7 10*3/uL — ABNORMAL HIGH (ref 4.0–10.5)
nRBC: 0 % (ref 0.0–0.2)

## 2019-05-07 MED ORDER — ENSURE PRE-SURGERY PO LIQD
296.0000 mL | Freq: Once | ORAL | Status: DC
  Filled 2019-05-07: qty 296

## 2019-05-07 NOTE — Progress Notes (Signed)
04-27-17 ( Epic) ECHO

## 2019-05-09 ENCOUNTER — Other Ambulatory Visit: Payer: Self-pay

## 2019-05-09 ENCOUNTER — Ambulatory Visit (HOSPITAL_COMMUNITY)
Admission: RE | Admit: 2019-05-09 | Discharge: 2019-05-09 | Disposition: A | Discharge status: Home / Self Care | Payer: Medicare Other | Attending: General Surgery | Admitting: General Surgery

## 2019-05-09 ENCOUNTER — Ambulatory Visit (HOSPITAL_COMMUNITY): Payer: Medicare Other | Admitting: Physician Assistant

## 2019-05-09 ENCOUNTER — Encounter (HOSPITAL_COMMUNITY)
Admission: RE | Disposition: A | Discharge status: Home / Self Care | Payer: Self-pay | Source: Home / Self Care | Attending: General Surgery

## 2019-05-09 ENCOUNTER — Ambulatory Visit (HOSPITAL_COMMUNITY): Payer: Medicare Other | Admitting: Certified Registered Nurse Anesthetist

## 2019-05-09 ENCOUNTER — Encounter (HOSPITAL_COMMUNITY): Payer: Self-pay | Admitting: Anesthesiology

## 2019-05-09 DIAGNOSIS — Z7983 Long term (current) use of bisphosphonates: Secondary | ICD-10-CM | POA: Insufficient documentation

## 2019-05-09 DIAGNOSIS — Z905 Acquired absence of kidney: Secondary | ICD-10-CM | POA: Insufficient documentation

## 2019-05-09 DIAGNOSIS — Z85528 Personal history of other malignant neoplasm of kidney: Secondary | ICD-10-CM | POA: Diagnosis not present

## 2019-05-09 DIAGNOSIS — K219 Gastro-esophageal reflux disease without esophagitis: Secondary | ICD-10-CM | POA: Insufficient documentation

## 2019-05-09 DIAGNOSIS — Z7951 Long term (current) use of inhaled steroids: Secondary | ICD-10-CM | POA: Diagnosis not present

## 2019-05-09 DIAGNOSIS — M1990 Unspecified osteoarthritis, unspecified site: Secondary | ICD-10-CM | POA: Insufficient documentation

## 2019-05-09 DIAGNOSIS — K4090 Unilateral inguinal hernia, without obstruction or gangrene, not specified as recurrent: Secondary | ICD-10-CM | POA: Insufficient documentation

## 2019-05-09 DIAGNOSIS — Z87891 Personal history of nicotine dependence: Secondary | ICD-10-CM | POA: Insufficient documentation

## 2019-05-09 DIAGNOSIS — I10 Essential (primary) hypertension: Secondary | ICD-10-CM | POA: Insufficient documentation

## 2019-05-09 DIAGNOSIS — J45909 Unspecified asthma, uncomplicated: Secondary | ICD-10-CM | POA: Diagnosis not present

## 2019-05-09 DIAGNOSIS — Z882 Allergy status to sulfonamides status: Secondary | ICD-10-CM | POA: Insufficient documentation

## 2019-05-09 DIAGNOSIS — G8918 Other acute postprocedural pain: Secondary | ICD-10-CM | POA: Diagnosis not present

## 2019-05-09 DIAGNOSIS — Z79899 Other long term (current) drug therapy: Secondary | ICD-10-CM | POA: Insufficient documentation

## 2019-05-09 DIAGNOSIS — Z7952 Long term (current) use of systemic steroids: Secondary | ICD-10-CM | POA: Insufficient documentation

## 2019-05-09 DIAGNOSIS — Z886 Allergy status to analgesic agent status: Secondary | ICD-10-CM | POA: Insufficient documentation

## 2019-05-09 DIAGNOSIS — M810 Age-related osteoporosis without current pathological fracture: Secondary | ICD-10-CM | POA: Diagnosis not present

## 2019-05-09 DIAGNOSIS — Z981 Arthrodesis status: Secondary | ICD-10-CM | POA: Insufficient documentation

## 2019-05-09 HISTORY — PX: INGUINAL HERNIA REPAIR: SHX194

## 2019-05-09 SURGERY — REPAIR, HERNIA, INGUINAL, ADULT
Anesthesia: General | Site: Abdomen | Laterality: Left

## 2019-05-09 MED ORDER — MIDAZOLAM HCL 2 MG/2ML IJ SOLN
INTRAMUSCULAR | Status: AC
  Filled 2019-05-09: qty 2

## 2019-05-09 MED ORDER — ONDANSETRON HCL 4 MG/2ML IJ SOLN
INTRAMUSCULAR | Status: AC
  Filled 2019-05-09: qty 2

## 2019-05-09 MED ORDER — CEFAZOLIN SODIUM-DEXTROSE 2-4 GM/100ML-% IV SOLN
2.0000 g | INTRAVENOUS | Status: AC
  Administered 2019-05-09: 2 g via INTRAVENOUS
  Filled 2019-05-09: qty 100

## 2019-05-09 MED ORDER — ONDANSETRON HCL 4 MG/2ML IJ SOLN
INTRAMUSCULAR | Status: DC | PRN
  Administered 2019-05-09: 4 mg via INTRAVENOUS

## 2019-05-09 MED ORDER — MEPERIDINE HCL 50 MG/ML IJ SOLN: 6.2500 mg | INTRAMUSCULAR | Status: DC | PRN

## 2019-05-09 MED ORDER — BUPIVACAINE HCL (PF) 0.5 % IJ SOLN
INTRAMUSCULAR | Status: DC | PRN
  Administered 2019-05-09: 20 mL

## 2019-05-09 MED ORDER — BUPIVACAINE HCL (PF) 0.5 % IJ SOLN
INTRAMUSCULAR | Status: AC
  Filled 2019-05-09: qty 30

## 2019-05-09 MED ORDER — CHLORHEXIDINE GLUCONATE CLOTH 2 % EX PADS: 6.0000 | MEDICATED_PAD | Freq: Once | CUTANEOUS | Status: DC

## 2019-05-09 MED ORDER — 0.9 % SODIUM CHLORIDE (POUR BTL) OPTIME
TOPICAL | Status: DC | PRN
  Administered 2019-05-09: 1000 mL

## 2019-05-09 MED ORDER — PROPOFOL 10 MG/ML IV BOLUS
INTRAVENOUS | Status: DC | PRN
  Administered 2019-05-09: 200 mg via INTRAVENOUS

## 2019-05-09 MED ORDER — OXYCODONE HCL 5 MG PO TABS
5.0000 mg | ORAL_TABLET | Freq: Once | ORAL | Status: AC
  Administered 2019-05-09: 5 mg via ORAL

## 2019-05-09 MED ORDER — PROMETHAZINE HCL 25 MG/ML IJ SOLN: 6.2500 mg | INTRAMUSCULAR | Status: DC | PRN

## 2019-05-09 MED ORDER — LACTATED RINGERS IV SOLN: INTRAVENOUS | Status: DC

## 2019-05-09 MED ORDER — DEXAMETHASONE SODIUM PHOSPHATE 10 MG/ML IJ SOLN
INTRAMUSCULAR | Status: DC | PRN
  Administered 2019-05-09: 5 mg via INTRAVENOUS

## 2019-05-09 MED ORDER — LIDOCAINE 2% (20 MG/ML) 5 ML SYRINGE
INTRAMUSCULAR | Status: AC
  Filled 2019-05-09: qty 5

## 2019-05-09 MED ORDER — DEXAMETHASONE SODIUM PHOSPHATE 10 MG/ML IJ SOLN
INTRAMUSCULAR | Status: AC
  Filled 2019-05-09: qty 1

## 2019-05-09 MED ORDER — LIDOCAINE 2% (20 MG/ML) 5 ML SYRINGE
INTRAMUSCULAR | Status: DC | PRN
  Administered 2019-05-09: 60 mg via INTRAVENOUS

## 2019-05-09 MED ORDER — LACTATED RINGERS IV SOLN
INTRAVENOUS | Status: DC
  Administered 2019-05-09: 08:00:00 via INTRAVENOUS

## 2019-05-09 MED ORDER — BUPIVACAINE LIPOSOME 1.3 % IJ SUSP
INTRAMUSCULAR | Status: DC | PRN
  Administered 2019-05-09: 10 mL

## 2019-05-09 MED ORDER — LIDOCAINE HCL 2 % IJ SOLN
INTRAMUSCULAR | Status: AC
  Filled 2019-05-09: qty 20

## 2019-05-09 MED ORDER — ACETAMINOPHEN 160 MG/5ML PO SOLN: 325.0000 mg | Freq: Once | ORAL | Status: DC | PRN

## 2019-05-09 MED ORDER — SUCCINYLCHOLINE CHLORIDE 200 MG/10ML IV SOSY
PREFILLED_SYRINGE | INTRAVENOUS | Status: AC
  Filled 2019-05-09: qty 10

## 2019-05-09 MED ORDER — KETAMINE HCL 10 MG/ML IJ SOLN: INTRAMUSCULAR | Status: DC | PRN

## 2019-05-09 MED ORDER — OXYCODONE HCL 5 MG PO TABS
ORAL_TABLET | ORAL | Status: AC
  Filled 2019-05-09: qty 1

## 2019-05-09 MED ORDER — MIDAZOLAM HCL 2 MG/2ML IJ SOLN
1.0000 mg | INTRAMUSCULAR | Status: DC
  Filled 2019-05-09: qty 2

## 2019-05-09 MED ORDER — PROPOFOL 10 MG/ML IV BOLUS
INTRAVENOUS | Status: AC
  Filled 2019-05-09: qty 20

## 2019-05-09 MED ORDER — FENTANYL CITRATE (PF) 100 MCG/2ML IJ SOLN
50.0000 ug | INTRAMUSCULAR | Status: DC
  Administered 2019-05-09: 75 ug via INTRAVENOUS
  Filled 2019-05-09: qty 2

## 2019-05-09 MED ORDER — HEPARIN SODIUM (PORCINE) 5000 UNIT/ML IJ SOLN
5000.0000 [IU] | Freq: Once | INTRAMUSCULAR | Status: AC
  Administered 2019-05-09: 5000 [IU] via SUBCUTANEOUS
  Filled 2019-05-09: qty 1

## 2019-05-09 MED ORDER — LIDOCAINE 2% (20 MG/ML) 5 ML SYRINGE
INTRAMUSCULAR | Status: DC | PRN
  Administered 2019-05-09: 1.5 mg/kg/h via INTRAVENOUS

## 2019-05-09 MED ORDER — FENTANYL CITRATE (PF) 250 MCG/5ML IJ SOLN
INTRAMUSCULAR | Status: DC | PRN
  Administered 2019-05-09 (×3): 50 ug via INTRAVENOUS

## 2019-05-09 MED ORDER — MIDAZOLAM HCL 5 MG/5ML IJ SOLN
INTRAMUSCULAR | Status: DC | PRN
  Administered 2019-05-09: 1 mg via INTRAVENOUS

## 2019-05-09 MED ORDER — BUPIVACAINE HCL 0.5 % IJ SOLN
INTRAMUSCULAR | Status: DC | PRN
  Administered 2019-05-09: 30 mL

## 2019-05-09 MED ORDER — FENTANYL CITRATE (PF) 250 MCG/5ML IJ SOLN
INTRAMUSCULAR | Status: AC
  Filled 2019-05-09: qty 5

## 2019-05-09 MED ORDER — ACETAMINOPHEN 325 MG PO TABS: 325.0000 mg | ORAL_TABLET | Freq: Once | ORAL | Status: DC | PRN

## 2019-05-09 MED ORDER — OXYCODONE HCL 5 MG PO TABS: 5.0000 mg | ORAL_TABLET | Freq: Four times a day (QID) | ORAL | 0 refills | Status: DC | PRN

## 2019-05-09 MED ORDER — ACETAMINOPHEN 10 MG/ML IV SOLN
INTRAVENOUS | Status: AC
  Filled 2019-05-09: qty 100

## 2019-05-09 MED ORDER — PHENYLEPHRINE HCL (PRESSORS) 10 MG/ML IV SOLN
INTRAVENOUS | Status: AC
  Filled 2019-05-09: qty 1

## 2019-05-09 MED ORDER — ACETAMINOPHEN 10 MG/ML IV SOLN: 1000.0000 mg | Freq: Once | INTRAVENOUS | Status: DC | PRN

## 2019-05-09 MED ORDER — FENTANYL CITRATE (PF) 100 MCG/2ML IJ SOLN: 25.0000 ug | INTRAMUSCULAR | Status: DC | PRN

## 2019-05-09 MED ORDER — ROCURONIUM BROMIDE 10 MG/ML (PF) SYRINGE
PREFILLED_SYRINGE | INTRAVENOUS | Status: AC
  Filled 2019-05-09: qty 10

## 2019-05-09 MED ORDER — EPHEDRINE SULFATE-NACL 50-0.9 MG/10ML-% IV SOSY
PREFILLED_SYRINGE | INTRAVENOUS | Status: DC | PRN
  Administered 2019-05-09 (×2): 10 mg via INTRAVENOUS

## 2019-05-09 SURGICAL SUPPLY — 46 items
BENZOIN TINCTURE PRP APPL 2/3 (GAUZE/BANDAGES/DRESSINGS) IMPLANT
BLADE SURG 15 STRL LF DISP TIS (BLADE) ×1 IMPLANT
BLADE SURG 15 STRL SS (BLADE) ×2
CELLS DAT CNTRL 66122 CELL SVR (MISCELLANEOUS) IMPLANT
CHLORAPREP W/TINT 26 (MISCELLANEOUS) ×3 IMPLANT
CLOSURE WOUND 1/2 X4 (GAUZE/BANDAGES/DRESSINGS)
COVER SURGICAL LIGHT HANDLE (MISCELLANEOUS) ×3 IMPLANT
COVER WAND RF STERILE (DRAPES) ×3 IMPLANT
DECANTER SPIKE VIAL GLASS SM (MISCELLANEOUS) ×3 IMPLANT
DERMABOND ADVANCED (GAUZE/BANDAGES/DRESSINGS) ×2
DERMABOND ADVANCED .7 DNX12 (GAUZE/BANDAGES/DRESSINGS) ×1 IMPLANT
DRAIN PENROSE 18X1/2 LTX STRL (DRAIN) ×3 IMPLANT
DRAPE LAPAROTOMY TRNSV 102X78 (DRAPES) ×3 IMPLANT
DRSG TEGADERM 4X4.75 (GAUZE/BANDAGES/DRESSINGS) IMPLANT
DRSG TELFA PLUS 4X6 ADH ISLAND (GAUZE/BANDAGES/DRESSINGS) IMPLANT
ELECT PENCIL ROCKER SW 15FT (MISCELLANEOUS) ×3 IMPLANT
ELECT REM PT RETURN 15FT ADLT (MISCELLANEOUS) ×3 IMPLANT
GAUZE SPONGE 4X4 12PLY STRL (GAUZE/BANDAGES/DRESSINGS) IMPLANT
GLOVE BIOGEL PI IND STRL 7.0 (GLOVE) IMPLANT
GLOVE BIOGEL PI INDICATOR 7.0 (GLOVE)
GLOVE SURG SS PI 7.0 STRL IVOR (GLOVE) ×3 IMPLANT
GOWN STRL REUS W/TWL LRG LVL3 (GOWN DISPOSABLE) ×3 IMPLANT
GOWN STRL REUS W/TWL XL LVL3 (GOWN DISPOSABLE) ×3 IMPLANT
KIT BASIN OR (CUSTOM PROCEDURE TRAY) ×3 IMPLANT
KIT TURNOVER KIT A (KITS) IMPLANT
MESH HERNIA 3X6 (Mesh General) ×3 IMPLANT
NEEDLE HYPO 22GX1.5 SAFETY (NEEDLE) ×3 IMPLANT
PACK BASIC VI WITH GOWN DISP (CUSTOM PROCEDURE TRAY) ×3 IMPLANT
RETRACTOR WND ALEXIS 25 LRG (MISCELLANEOUS) IMPLANT
RTRCTR WOUND ALEXIS 18CM MED (MISCELLANEOUS)
RTRCTR WOUND ALEXIS 25CM LRG (MISCELLANEOUS)
SPONGE LAP 18X18 RF (DISPOSABLE) IMPLANT
SPONGE LAP 4X18 RFD (DISPOSABLE) ×3 IMPLANT
STRIP CLOSURE SKIN 1/2X4 (GAUZE/BANDAGES/DRESSINGS) IMPLANT
SUT MNCRL AB 4-0 PS2 18 (SUTURE) ×3 IMPLANT
SUT PROLENE 2 0 CT2 30 (SUTURE) ×6 IMPLANT
SUT SILK 2 0 SH CR/8 (SUTURE) IMPLANT
SUT VIC AB 2-0 CT1 27 (SUTURE) ×4
SUT VIC AB 2-0 CT1 TAPERPNT 27 (SUTURE) ×2 IMPLANT
SUT VIC AB 3-0 SH 27 (SUTURE) ×4
SUT VIC AB 3-0 SH 27XBRD (SUTURE) ×2 IMPLANT
SYR BULB IRRIGATION 50ML (SYRINGE) ×3 IMPLANT
SYR CONTROL 10ML LL (SYRINGE) ×3 IMPLANT
TOWEL OR 17X26 10 PK STRL BLUE (TOWEL DISPOSABLE) ×3 IMPLANT
TOWEL OR NON WOVEN STRL DISP B (DISPOSABLE) ×3 IMPLANT
YANKAUER SUCT BULB TIP 10FT TU (MISCELLANEOUS) ×3 IMPLANT

## 2019-05-09 NOTE — Anesthesia Postprocedure Evaluation (Signed)
Anesthesia Post Note  Patient: Jeffey P Monnin  Procedure(s) Performed: OPEN LEFT INGUINAL HERNIA REPAIR WITH MESH, EPIGASTRIC SUTURE REMOVAL (Left Abdomen)     Patient location during evaluation: PACU Anesthesia Type: General Level of consciousness: awake and alert Pain management: pain level controlled Vital Signs Assessment: post-procedure vital signs reviewed and stable Respiratory status: spontaneous breathing, nonlabored ventilation, respiratory function stable and patient connected to nasal cannula oxygen Cardiovascular status: blood pressure returned to baseline and stable Postop Assessment: no apparent nausea or vomiting Anesthetic complications: no    Last Vitals:  Vitals:   05/09/19 1100 05/09/19 1106  BP: (!) 152/88 (!) 158/85  Pulse: 72 62  Resp: 15   Temp: (!) 36.3 C 36.4 C  SpO2: 98% 96%    Last Pain:  Vitals:   05/09/19 1106  TempSrc:   PainSc: (P) 3                  Effie Berkshire

## 2019-05-09 NOTE — Discharge Instructions (Signed)
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
## 2019-05-09 NOTE — Anesthesia Preprocedure Evaluation (Addendum)
Anesthesia Evaluation  Patient identified by MRN, date of birth, ID band Patient awake    Reviewed: Allergy & Precautions, NPO status , Patient's Chart, lab work & pertinent test results  Airway Mallampati: II  TM Distance: >3 FB Neck ROM: Full    Dental  (+) Teeth Intact, Dental Advisory Given   Pulmonary asthma , former smoker,    breath sounds clear to auscultation       Cardiovascular hypertension,  Rhythm:Regular Rate:Normal     Neuro/Psych  Headaches,  Neuromuscular disease    GI/Hepatic GERD  Medicated,(+) Hepatitis -  Endo/Other    Renal/GU      Musculoskeletal   Abdominal Normal abdominal exam  (+)   Peds  Hematology   Anesthesia Other Findings   Reproductive/Obstetrics                            Anesthesia Physical Anesthesia Plan  ASA: III  Anesthesia Plan: General   Post-op Pain Management: GA combined w/ Regional for post-op pain   Induction: Intravenous  PONV Risk Score and Plan: 3 and Ondansetron, Dexamethasone and Treatment may vary due to age or medical condition  Airway Management Planned: LMA  Additional Equipment: None  Intra-op Plan:   Post-operative Plan: Extubation in OR  Informed Consent: I have reviewed the patients History and Physical, chart, labs and discussed the procedure including the risks, benefits and alternatives for the proposed anesthesia with the patient or authorized representative who has indicated his/her understanding and acceptance.     Dental advisory given  Plan Discussed with: CRNA  Anesthesia Plan Comments:        Anesthesia Quick Evaluation

## 2019-05-09 NOTE — Anesthesia Procedure Notes (Signed)
Anesthesia Regional Block: TAP block   Pre-Anesthetic Checklist: ,, timeout performed, Correct Patient, Correct Site, Correct Laterality, Correct Procedure, Correct Position, site marked, Risks and benefits discussed,  Surgical consent,  Pre-op evaluation,  At surgeon's request and post-op pain management  Laterality: Left  Prep: chloraprep       Needles:  Injection technique: Single-shot  Needle Type: Echogenic Stimulator Needle     Needle Length: 9cm  Needle Gauge: 21     Additional Needles:   Procedures:,,,, ultrasound used (permanent image in chart),,,,  Narrative:  Start time: 05/09/2019 8:35 AM End time: 05/09/2019 8:45 AM Injection made incrementally with aspirations every 5 mL.  Performed by: Personally  Anesthesiologist: Effie Berkshire, MD  Additional Notes: Patient tolerated the procedure well. Local anesthetic introduced in an incremental fashion under minimal resistance after negative aspirations. No paresthesias were elicited. After completion of the procedure, no acute issues were identified and patient continued to be monitored by RN.

## 2019-05-09 NOTE — Anesthesia Procedure Notes (Signed)
Procedure Name: LMA Insertion Date/Time: 05/09/2019 9:15 AM Performed by: Mitzie Na, CRNA Pre-anesthesia Checklist: Patient identified, Timeout performed, Suction available, Emergency Drugs available and Patient being monitored Patient Re-evaluated:Patient Re-evaluated prior to induction Oxygen Delivery Method: Circle system utilized Preoxygenation: Pre-oxygenation with 100% oxygen Induction Type: IV induction LMA: LMA inserted LMA Size: 4.0 Tube secured with: Tape Dental Injury: Teeth and Oropharynx as per pre-operative assessment

## 2019-05-09 NOTE — Op Note (Signed)
Preop diagnosis: left inguinal hernia  Postop diagnosis: left indirect inguinal hernia  Procedure: open Left inguinal hernia repair with mesh, excision of epigastric suture  Surgeon: Gurney Maxin, M.D.  Asst: none  Anesthesia: Gen.   Indications for procedure: Brian Leon is a 73 y.o. male with symptoms of pain and enlarging Left inguinal hernia(s). After discussing risks, alternatives and benefits he decided on open repair and was brought to day surgery for repair.  Description of procedure: The patient was brought into the operative suite, placed supine. Anesthesia was administered with endotracheal tube. Patient was strapped in place. The patient was prepped and draped in the usual sterile fashion.  A small incision was made over the underlying stitch. Blunt dissection was used to identify the stitch. The stitch was cut free. The wound was closed with 4-0 monocryl.  The anterior superior iliac spine and pubic tubercle were identified on the Left side. An incision was made 1cm above the connecting line, representative of the location of the inguinal ligament. The subcutaneous tissue was bluntly dissected, scarpa's fascia was dissected away. The external abdominal oblique fascia was identified and sharply opened down to the external inguinal ring. The conjoint tendon and inguinal ligament were identified. The cord structures and sac were dissected free of the surrounding tissue in 360 degrees. A penrose drain was used to encircle the contents. The cremasteric fibers were dissected free of the contents of the cord and hernia sac. The cord structures (vessels and vas deferens) were identified and carefully dissected away from the hernia sac. The hernia sac was dissected down to the internal inguinal ring. Preperitoneal fat was identified showing appropriate dissection. Hernia was indirect and the sac was already reduced. The cord had some fatty tissue containing vessels and so fat tissue was  left in place. The sac was then reduced into the preperitoneal space. A 3x6 Bard mesh was then used to close the defect and reinforce the floor. The mesh was sutured to the lacunar ligament and inguinal ligament using a 2-0 prolene in running fashion. Next the superior edge of the mesh was sutured to the conjoined tendon using a 2-0 running Prolene. An additional 2-0 Prolene was used to suture the tail ends of the mesh together re-creating the deep ring. Cord structures are running in a neutral position through the mesh. Next the external abdominal oblique fascia was closed with a 2-0 Vicryl in running fashion to re-create the external inguinal ring. Scarpa's fascia was closed with 3-0 Vicryl in running fashion. Skin was closed with a 4-0 Monocryl subcuticular stitch in running fashion. Dermabond place for dressing. Patient woke from anesthesia and brought to PACU in stable condition. All counts are correct.  Findings: left indirect inguinal hernia  Specimen: none  Blood loss: minimal  Local anesthesia: 30 ml Marcaine  Complications: none  Implant: 3 x 6 bard mesh  Gurney Maxin, M.D. General, Bariatric, & Minimally Invasive Surgery [facility], [place] 10:16 AM 05/09/2019

## 2019-05-09 NOTE — Transfer of Care (Signed)
Immediate Anesthesia Transfer of Care Note  Patient: Brian Leon  Procedure(s) Performed: OPEN LEFT INGUINAL HERNIA REPAIR WITH MESH, EPIGASTRIC SUTURE REMOVAL (Left Abdomen)  Patient Location: PACU  Anesthesia Type:General  Level of Consciousness: awake, alert , oriented and patient cooperative  Airway & Oxygen Therapy: Patient Spontanous Breathing and Patient connected to face mask  Post-op Assessment: Report given to RN  Post vital signs: Reviewed and stable  Last Vitals:  Vitals Value Taken Time  BP 168/87 05/09/19 1030  Temp    Pulse 65 05/09/19 1033  Resp 18 05/09/19 1033  SpO2 100 % 05/09/19 1033  Vitals shown include unvalidated device data.  Last Pain:  Vitals:   05/09/19 0823  TempSrc:   PainSc: 4       Patients Stated Pain Goal: 4 (22/24/11 4643)  Complications: No apparent anesthesia complications

## 2019-05-09 NOTE — Progress Notes (Signed)
Assisted Dr. Smith Robert with left, ultrasound guided, transabdominal plane block. Side rails up, monitors on throughout procedure. See vital signs in flow sheet. Tolerated Procedure well.

## 2019-05-10 ENCOUNTER — Encounter (HOSPITAL_COMMUNITY): Payer: Self-pay | Admitting: General Surgery

## 2019-05-24 ENCOUNTER — Telehealth: Payer: Self-pay | Admitting: Rheumatology

## 2019-05-24 MED ORDER — PREDNISONE 5 MG PO TABS: 5.0000 mg | ORAL_TABLET | Freq: Every day | ORAL | 0 refills | Status: DC

## 2019-05-24 NOTE — Telephone Encounter (Signed)
Last Visit: 03/20/2019  Next Visit: message sent to the front desk to schedule.   Okay to refill per Dr. Estanislado Pandy.

## 2019-05-24 NOTE — Telephone Encounter (Signed)
Patient called requesting prescription refill of Prednisone (3 month supply) to be sent to [facility] at [address].

## 2019-06-25 ENCOUNTER — Other Ambulatory Visit: Payer: Self-pay | Admitting: *Deleted

## 2019-06-25 MED ORDER — IBUPROFEN 200 MG PO TABS: 400.0000 mg | ORAL_TABLET | Freq: Three times a day (TID) | ORAL | 0 refills | Status: DC | PRN

## 2019-06-26 ENCOUNTER — Other Ambulatory Visit: Payer: Self-pay | Admitting: *Deleted

## 2019-06-28 NOTE — Telephone Encounter (Signed)
Patient calling to check the status of prescription.

## 2019-06-29 MED ORDER — FLUTICASONE-SALMETEROL 500-50 MCG/DOSE IN AEPB: INHALATION_SPRAY | RESPIRATORY_TRACT | 4 refills | Status: DC

## 2019-07-02 ENCOUNTER — Other Ambulatory Visit: Payer: Self-pay | Admitting: Rheumatology

## 2019-07-02 NOTE — Telephone Encounter (Addendum)
Last Visit: 03/20/19 Next Visit: 08/12/19 Labs: 10/15/18 Labs are stable. WBC count is slightly elevated we will continue to monitor it.   Patient reminded he is due to update labs. Patient will update on 07/05/19.   Okay to refill 30 day supply Fosamax?

## 2019-07-03 NOTE — Telephone Encounter (Signed)
We can send in a refill after lab work returns if he is planning on obtaining labs on 07/05/19 since he will not need another tablet of fosamax for 1 week.

## 2019-07-03 NOTE — Telephone Encounter (Signed)
Patient states he is still taking the Fosamax. Patient states he has not been taking it like he should. Patient states he just took it this morning.

## 2019-07-03 NOTE — Telephone Encounter (Signed)
Is he still taking fosamax? It has not been refilled since 11/09/18

## 2019-07-19 ENCOUNTER — Encounter: Payer: Self-pay | Admitting: Rheumatology

## 2019-07-19 ENCOUNTER — Ambulatory Visit (INDEPENDENT_AMBULATORY_CARE_PROVIDER_SITE_OTHER): Payer: Medicare Other | Admitting: Rheumatology

## 2019-07-19 ENCOUNTER — Other Ambulatory Visit: Payer: Self-pay

## 2019-07-19 VITALS — BP 146/90 | HR 71 | Resp 14 | Ht 69.5 in | Wt 133.8 lb

## 2019-07-19 DIAGNOSIS — E559 Vitamin D deficiency, unspecified: Secondary | ICD-10-CM | POA: Diagnosis not present

## 2019-07-19 DIAGNOSIS — M79641 Pain in right hand: Secondary | ICD-10-CM

## 2019-07-19 DIAGNOSIS — Z7952 Long term (current) use of systemic steroids: Secondary | ICD-10-CM | POA: Diagnosis not present

## 2019-07-19 DIAGNOSIS — Z8719 Personal history of other diseases of the digestive system: Secondary | ICD-10-CM | POA: Diagnosis not present

## 2019-07-19 DIAGNOSIS — I712 Thoracic aortic aneurysm, without rupture, unspecified: Secondary | ICD-10-CM

## 2019-07-19 DIAGNOSIS — Z872 Personal history of diseases of the skin and subcutaneous tissue: Secondary | ICD-10-CM | POA: Diagnosis not present

## 2019-07-19 DIAGNOSIS — D75839 Thrombocytosis, unspecified: Secondary | ICD-10-CM

## 2019-07-19 DIAGNOSIS — M5030 Other cervical disc degeneration, unspecified cervical region: Secondary | ICD-10-CM

## 2019-07-19 DIAGNOSIS — D473 Essential (hemorrhagic) thrombocythemia: Secondary | ICD-10-CM

## 2019-07-19 DIAGNOSIS — I10 Essential (primary) hypertension: Secondary | ICD-10-CM | POA: Diagnosis not present

## 2019-07-19 DIAGNOSIS — M353 Polymyalgia rheumatica: Secondary | ICD-10-CM

## 2019-07-19 DIAGNOSIS — Z87442 Personal history of urinary calculi: Secondary | ICD-10-CM

## 2019-07-19 DIAGNOSIS — Z970 Presence of artificial eye: Secondary | ICD-10-CM

## 2019-07-19 DIAGNOSIS — Z9889 Other specified postprocedural states: Secondary | ICD-10-CM

## 2019-07-19 DIAGNOSIS — M810 Age-related osteoporosis without current pathological fracture: Secondary | ICD-10-CM | POA: Diagnosis not present

## 2019-07-19 DIAGNOSIS — R911 Solitary pulmonary nodule: Secondary | ICD-10-CM | POA: Diagnosis not present

## 2019-07-19 DIAGNOSIS — Z862 Personal history of diseases of the blood and blood-forming organs and certain disorders involving the immune mechanism: Secondary | ICD-10-CM

## 2019-07-19 DIAGNOSIS — M79642 Pain in left hand: Secondary | ICD-10-CM

## 2019-07-19 DIAGNOSIS — N132 Hydronephrosis with renal and ureteral calculous obstruction: Secondary | ICD-10-CM

## 2019-07-19 MED ORDER — PREDNISONE 1 MG PO TABS: ORAL_TABLET | ORAL | 1 refills | Status: DC

## 2019-07-19 MED ORDER — ALENDRONATE SODIUM 70 MG PO TABS: ORAL_TABLET | ORAL | 0 refills | Status: DC
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
## 2019-07-26 ENCOUNTER — Other Ambulatory Visit: Payer: Self-pay

## 2019-07-26 DIAGNOSIS — R05 Cough: Secondary | ICD-10-CM

## 2019-07-26 DIAGNOSIS — R058 Other specified cough: Secondary | ICD-10-CM

## 2019-07-26 MED ORDER — ALBUTEROL SULFATE HFA 108 (90 BASE) MCG/ACT IN AERS: 1.0000 | INHALATION_SPRAY | Freq: Four times a day (QID) | RESPIRATORY_TRACT | 0 refills | Status: DC | PRN

## 2019-07-26 NOTE — Telephone Encounter (Signed)
Pt has an appt on 08/05/19 but needs this medication before the appointment. Ottis Stain, CMA

## 2019-08-05 ENCOUNTER — Other Ambulatory Visit: Payer: Self-pay

## 2019-08-05 ENCOUNTER — Encounter: Payer: Self-pay | Admitting: Family Medicine

## 2019-08-05 ENCOUNTER — Ambulatory Visit (INDEPENDENT_AMBULATORY_CARE_PROVIDER_SITE_OTHER): Payer: Medicare Other | Admitting: Family Medicine

## 2019-08-05 VITALS — BP 125/60 | Temp 96.7°F | Wt 135.2 lb

## 2019-08-05 DIAGNOSIS — I712 Thoracic aortic aneurysm, without rupture, unspecified: Secondary | ICD-10-CM

## 2019-08-05 DIAGNOSIS — D649 Anemia, unspecified: Secondary | ICD-10-CM

## 2019-08-05 DIAGNOSIS — Z23 Encounter for immunization: Secondary | ICD-10-CM

## 2019-08-05 DIAGNOSIS — Z0000 Encounter for general adult medical examination without abnormal findings: Secondary | ICD-10-CM | POA: Diagnosis not present

## 2019-08-05 HISTORY — DX: Encounter for immunization: Z23

## 2019-08-05 NOTE — Assessment & Plan Note (Signed)
Iron/Anemia studies normal. Hgb 05/07/2019 12.2. -Referral for colonoscopy

## 2019-08-05 NOTE — Patient Instructions (Signed)
It was very nice to meet you today. Please enjoy the rest of your week. Today you were seen for a check up. Follow up in 6 months-1 year or sooner if needed.   Please call the clinic at [phone] if your symptoms worsen or you have any concerns. It was our pleasure to serve you.

## 2019-08-05 NOTE — Assessment & Plan Note (Addendum)
Needs colonoscopy. Overdue for 3 years but has been referred to GI multiple times. Has been given  Could be the source of anemia. -Referral for colonoscopy

## 2019-08-05 NOTE — Progress Notes (Signed)
   Subjective:    Patient ID: Brian Leon, male    DOB: April 08, 1946, 73 y.o.   MRN: [id number]   CC: Meet new pcp  HPI:  Brian Leon is presenting today to meet his new PCP. He is doing well. He would like 3 months supply on his medications when it is time for his next refill.   6 months to a year ago he started noticing losing hair on his lower limbs . It does not bother him he just wanted to mention it. He also noticed that his sister lost her leg hair at 26 years old.  He had a recent episode of increased work of breathing that has resolved. He feels as if he has developed emphysema. Was having continual coughing spells before his hernia repair surgery that was 2 months ago. He is a former smoker. 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   Smoking status reviewed  Review of Systems Per HPI, also denies recent illness, fever, headache, changes in vision, chest pain, shortness of breath, abdominal pain, N/V/D, weakness   Patient Active Problem List   Diagnosis Date Noted  . Erectile dysfunction 10/10/2018  . Hypercholesteremia 08/22/2018  . Nonhealing nonsurgical wound 07/03/2018  . Abdominal mass 03/14/2018  . Long term (current) use of systemic steroids 01/04/2018  . 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 Iron deficiency anemia 08/29/2017  . Polymyalgia rheumatica ([place]) 05/21/2017  . Thoracic aortic aneurysm ([place]) 05/21/2017  . Lung nodule 05/21/2017  . Thrombocytosis ([place]) 04/12/2017  . Osteoarthritis of spine with radiculopathy, 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  . 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 Actinic keratoses 06/28/2012  . 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 Non-recurrent unilateral inguinal hernia without obstruction or gangrene 12/13/2010  . Vitamin D deficiency 06/11/2009  . BENIGN PROSTATIC HYPERTROPHY, HX OF 06/17/2008     Objective:  Temp (!) 96.7 F (35.9 C) (Axillary)   Wt 135 lb 3.2 oz (61.3 kg)   SpO2 99%   BMI 19.68 kg/m  Vitals and nursing note  reviewed  General: Appears well, no acute distress. Age appropriate. Cardiac: RRR, normal heart sounds, no murmurs Respiratory: CTAB, normal effort Abdomen: soft, nontender, nondistended Extremities: No edema or cyanosis. Skin: Warm and dry, no rashes noted. Wispy blonde hair present scarcely on the lower extremities bilaterally. Neuro: alert and oriented, no focal deficits Psych: normal affect  Assessment & Plan:    Thoracic aortic aneurysm ([place]) Incidental finding 04/26/2017 on chest CT. Ascending thoracic aortic aneurysm 4.3cm. Was supposed to have CT follow up in 1 year but did not happened due to the [place] pandemic since he did not want to go into the hospital at that time. Could be a possible source of recent increase of WOB if aneurysm has expanded in size. -CTA chest w/ contrast 11/6 -CMP -Follow up in 76months to 1 year pending tests and/or sooner if needed  Need for immunization against influenza Received high dose flu shot today.  Healthcare maintenance Needs colonoscopy. Overdue for 3 years but has been referred to GI multiple times. Has been given  Could be the source of anemia. -Referral for colonoscopy  Anemia Iron/Anemia studies normal. Hgb 05/07/2019 12.2. -Referral for colonoscopy   Gerlene Fee, [place] PGY-1

## 2019-08-05 NOTE — Assessment & Plan Note (Addendum)
Incidental finding 04/26/2017 on chest CT. Ascending thoracic aortic aneurysm 4.3cm. Was supposed to have CT follow up in 1 year but did not happened due to the [place] pandemic since he did not want to go into the hospital at that time. Could be a possible source of recent increase of WOB if aneurysm has expanded in size. -CTA chest w/ contrast 11/6 -CMP -Follow up in 49months to 1 year pending tests and/or sooner if needed

## 2019-08-05 NOTE — Assessment & Plan Note (Signed)
Received high dose flu shot today.

## 2019-08-06 ENCOUNTER — Encounter: Payer: Self-pay | Admitting: Physician Assistant

## 2019-08-06 LAB — COMPREHENSIVE METABOLIC PANEL
ALT: 11 IU/L (ref 0–44)
AST: 13 IU/L (ref 0–40)
Albumin/Globulin Ratio: 1.9 (ref 1.2–2.2)
Albumin: 3.9 g/dL (ref 3.7–4.7)
Alkaline Phosphatase: 72 IU/L (ref 39–117)
BUN/Creatinine Ratio: 9 — ABNORMAL LOW (ref 10–24)
BUN: 9 mg/dL (ref 8–27)
Bilirubin Total: 0.5 mg/dL (ref 0.0–1.2)
CO2: 23 mmol/L (ref 20–29)
Calcium: 9.2 mg/dL (ref 8.6–10.2)
Chloride: 108 mmol/L — ABNORMAL HIGH (ref 96–106)
Creatinine, Ser: 1 mg/dL (ref 0.76–1.27)
GFR calc Af Amer: 85 mL/min/{1.73_m2} (ref >59)
GFR calc non Af Amer: 73 mL/min/{1.73_m2} (ref >59)
Globulin, Total: 2.1 g/dL (ref 1.5–4.5)
Glucose: 77 mg/dL (ref 65–99)
Potassium: 3.9 mmol/L (ref 3.5–5.2)
Sodium: 144 mmol/L (ref 134–144)
Total Protein: 6 g/dL (ref 6.0–8.5)

## 2019-08-09 ENCOUNTER — Other Ambulatory Visit: Payer: Self-pay

## 2019-08-09 ENCOUNTER — Ambulatory Visit (HOSPITAL_COMMUNITY)
Admission: RE | Admit: 2019-08-09 | Discharge: 2019-08-09 | Disposition: A | Discharge status: Home / Self Care | Payer: Medicare Other | Source: Ambulatory Visit | Attending: Family Medicine | Admitting: Family Medicine

## 2019-08-09 ENCOUNTER — Other Ambulatory Visit: Payer: Self-pay | Admitting: Family Medicine

## 2019-08-09 DIAGNOSIS — I712 Thoracic aortic aneurysm, without rupture, unspecified: Secondary | ICD-10-CM

## 2019-08-09 MED ORDER — IOHEXOL 350 MG/ML SOLN
80.0000 mL | Freq: Once | INTRAVENOUS | Status: AC | PRN
  Administered 2019-08-09: 10:00:00 80 mL via INTRAVENOUS

## 2019-08-12 ENCOUNTER — Ambulatory Visit: Payer: Medicare Other | Admitting: Rheumatology

## 2019-08-16 ENCOUNTER — Ambulatory Visit: Payer: Medicare Other | Admitting: Physician Assistant

## 2019-08-20 ENCOUNTER — Telehealth: Payer: Self-pay | Admitting: *Deleted

## 2019-08-20 MED ORDER — PREDNISONE 1 MG PO TABS: 3.0000 mg | ORAL_TABLET | Freq: Every day | ORAL | 1 refills | Status: DC
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
## 2019-09-04 DIAGNOSIS — L281 Prurigo nodularis: Secondary | ICD-10-CM | POA: Diagnosis not present

## 2019-09-04 DIAGNOSIS — L2081 Atopic neurodermatitis: Secondary | ICD-10-CM | POA: Diagnosis not present

## 2019-09-04 DIAGNOSIS — C44219 Basal cell carcinoma of skin of left ear and external auricular canal: Secondary | ICD-10-CM | POA: Diagnosis not present

## 2019-09-04 DIAGNOSIS — Z85828 Personal history of other malignant neoplasm of skin: Secondary | ICD-10-CM | POA: Diagnosis not present

## 2019-09-04 DIAGNOSIS — Z5181 Encounter for therapeutic drug level monitoring: Secondary | ICD-10-CM | POA: Diagnosis not present

## 2019-09-04 DIAGNOSIS — Z79899 Other long term (current) drug therapy: Secondary | ICD-10-CM | POA: Diagnosis not present

## 2019-09-04 DIAGNOSIS — C44319 Basal cell carcinoma of skin of other parts of face: Secondary | ICD-10-CM | POA: Diagnosis not present

## 2019-09-04 DIAGNOSIS — L570 Actinic keratosis: Secondary | ICD-10-CM | POA: Diagnosis not present

## 2019-09-05 ENCOUNTER — Telehealth: Payer: Self-pay | Admitting: Rheumatology

## 2019-09-05 NOTE — Telephone Encounter (Signed)
Attempted to contact patient and left message on machine for patient to call to schedule a virtual visit.

## 2019-09-05 NOTE — Telephone Encounter (Signed)
Patient calling in reference to decreasing Prednisone dose. He is now having weakness, and back aches like he was before starting medication. Please call to discuss.

## 2019-09-05 NOTE — Telephone Encounter (Signed)
Patient states he decreased prednisone to 3mg  po qd and after 1 week noticed symptoms returning (weakness, pain, aches). Patient states 2 days ago, he increased prednisone back to 5mg  po qd and has started feeling better. Patient is scheduled for a virtual telephone visit on 09/06/2019.

## 2019-09-05 NOTE — Progress Notes (Signed)
Virtual Visit via Telephone Note  I connected with Brian Leon on 09/06/19 at  9:00 AM EST by telephone and verified that I am speaking with the correct person using two identifiers.  Location: Patient: Home  Provider: Clinic  This service was conducted via virtual visit.    The patient was located at home. I was located in my office.  Consent was obtained prior to the virtual visit and is aware of possible charges through their insurance for this visit.  The patient is an established patient.  Dr. Estanislado Pandy, MD conducted the virtual visit and Brian Sams, PA-C acted as scribe during the service.  Office staff helped with scheduling follow up visits after the service was conducted.   I discussed the limitations, risks, security and privacy concerns of performing an evaluation and management service by telephone and the availability of in person appointments. I also discussed with the patient that there may be a patient responsible charge related to this service. The patient expressed understanding and agreed to proceed.  CC: Muscle fatigue  History of Present Illness: Patient is a 73 year old male with a past medical history of PMR, DDD, and osteoporosis.  He reports he recently tried to taper prednisone from 4 mg to 3 mg po daily, but within 4-5 days he developed increased muscle weakness and muscle fatigue. He had difficulty climbing steps and getting up from a chair.  He increased his dose of prednisone to 5 mg po daily and his symptoms have improved.   Review of Systems  Constitutional: Positive for malaise/fatigue. Negative for fever.  Eyes: Negative for photophobia, pain, discharge and redness.  Respiratory: Negative for cough, shortness of breath and wheezing.   Cardiovascular: Negative for chest pain and palpitations.  Gastrointestinal: Negative for blood in stool, constipation and diarrhea.  Genitourinary: Negative for dysuria.  Musculoskeletal: Positive for back pain and myalgias.  Negative for joint pain and neck pain.       +Muscle tenderness +Muscle weakness   Skin: Negative for rash.  Neurological: Negative for dizziness and headaches.  Psychiatric/Behavioral: Negative for depression. The patient is not nervous/anxious and does not have insomnia.       Observations/Objective: Physical Exam  Constitutional: He is oriented to person, place, and time.  Neurological: He is alert and oriented to person, place, and time.  Psychiatric: Mood, memory, affect and judgment normal.   Patient reports morning stiffness for 5  minutes.   Patient denies nocturnal pain.  Difficulty dressing/grooming: Denies Difficulty climbing stairs: Denies Difficulty getting out of chair: Denies Difficulty using hands for taps, buttons, cutlery, and/or writing: Denies   Assessment and Plan: Visit Diagnoses: Polymyalgia rheumatica ([place]): Sed rate was 9 on 03/20/19: He recently tried tapering prednisone from 4 mg daily to 3 mg daily and within 4-5 days he was having increased muscle weakness and muscle tenderness.  He was having difficulty climbing steps and difficulty getting up from a chair/toliet.  He has increased prednisone to 5 mg po daily.  His fatigue and muscle weakness has improved since increasing the dose of prednisone.  We discussed starting him on Methotrexate as a steroid sparing agent.  Indications, contraindications, and potential side effects of MTX were discussed.  All questions were addressed.  He has taken MTX in the past for a skin rash, and he reports he tolerated in the past. He will need to sign the consent form prior to sending in the prescription. He will take methotrexate 4 tablet by mouth once  weekly and folic acid 1 mg po daily.  He will continue to take prednisone 5 mg po daily for 2 months and then try tapering to 4 mg po daily.  He will follow up in 6 weeks.   Drug Counseling TB Gold: 12/06/17  Hepatitis panel: Negative on 12/06/17 Chest-xray:  No active lung disease  on 03/28/17  Alcohol use: Discussed  Patient was counseled on the purpose, proper use, and adverse effects of methotrexate including nausea, infection, and signs and symptoms of pneumonitis.  Reviewed instructions with patient to take methotrexate weekly along with folic acid daily.  Discussed the importance of frequent monitoring of kidney and liver function and blood counts, and provided patient with standing lab instructions.  Counseled patient to avoid NSAIDs and alcohol while on methotrexate.  Provided patient with educational materials on methotrexate and answered all questions.  Advised patient to get annual influenza vaccine and to get a pneumococcal vaccine if patient has not already had one.  Patient voiced understanding.  Patient consented to methotrexate use.  Will upload into chart.    High risk medication use: He will be starting on MTX 4 tablets by mouth once weekly and folic acid 1 mg po daily.  He return for lab work in 2 weeks, 2 months, then every 3 months. Standing orders placed today.   Long term (current) use of systemic steroids -He is currently on prednisone 5 mg po daily. We discussed starting him MTX as a steroid sparing agent.   Pain in both hands: He is not having any increased hand pain or joint swelling at this time.   DDD (degenerative disc disease), cervical: He has chronic neck stiffness but no symptoms of radiculopathy.   Age-related osteoporosis without current pathological fracture - He is taking Fosamax 70 mg by mouth once weekly. He is on long term prednisone. DEXA 05/10/18: BMD measured at [facility] is 0.639 g/cm2 with a T-score of -2.9.  He will be due to update his bone density in August 2021.  Acute midline low back pain without sciatica: He has been experiencing lower back pain for the past 1 week.  He is not having any symptoms of sciatica.  He was raking leaves last week, which exacerbated his discomfort.  His discomfort is improving.  We will provide  a handout of back exercises.   Other medical conditions are listed as follows:   Vitamin D deficiency  History of dermatitis  Lung nodule  Essential hypertension  Thoracic aortic aneurysm without rupture (HCC)  Thrombocytosis ([place])  History of IBS  History of bilateral carpal tunnel release  Ureteral stone with hydronephrosis  Prosthetic eye globe  History of kidney stones  History of iron deficiency anemia Follow Up Instructions: He will follow up in 6 weeks.    I discussed the assessment and treatment plan with the patient. The patient was provided an opportunity to ask questions and all were answered. The patient agreed with the plan and demonstrated an understanding of the instructions.   The patient was advised to call back or seek an in-person evaluation if the symptoms worsen or if the condition fails to improve as anticipated.  I provided 30 minutes of non-face-to-face time during this encounter.   Bo Merino, MD   Scribed by-  Brian Sams, PA-C

## 2019-09-06 ENCOUNTER — Other Ambulatory Visit: Payer: Self-pay | Admitting: *Deleted

## 2019-09-06 ENCOUNTER — Other Ambulatory Visit: Payer: Self-pay

## 2019-09-06 ENCOUNTER — Telehealth: Payer: Self-pay | Admitting: *Deleted

## 2019-09-06 ENCOUNTER — Encounter: Payer: Self-pay | Admitting: Rheumatology

## 2019-09-06 ENCOUNTER — Telehealth (INDEPENDENT_AMBULATORY_CARE_PROVIDER_SITE_OTHER): Payer: Medicare Other | Admitting: Rheumatology

## 2019-09-06 DIAGNOSIS — Z7952 Long term (current) use of systemic steroids: Secondary | ICD-10-CM

## 2019-09-06 DIAGNOSIS — Z872 Personal history of diseases of the skin and subcutaneous tissue: Secondary | ICD-10-CM

## 2019-09-06 DIAGNOSIS — R911 Solitary pulmonary nodule: Secondary | ICD-10-CM

## 2019-09-06 DIAGNOSIS — M545 Low back pain, unspecified: Secondary | ICD-10-CM

## 2019-09-06 DIAGNOSIS — M810 Age-related osteoporosis without current pathological fracture: Secondary | ICD-10-CM | POA: Diagnosis not present

## 2019-09-06 DIAGNOSIS — M353 Polymyalgia rheumatica: Secondary | ICD-10-CM | POA: Diagnosis not present

## 2019-09-06 DIAGNOSIS — N132 Hydronephrosis with renal and ureteral calculous obstruction: Secondary | ICD-10-CM

## 2019-09-06 DIAGNOSIS — I712 Thoracic aortic aneurysm, without rupture, unspecified: Secondary | ICD-10-CM

## 2019-09-06 DIAGNOSIS — D473 Essential (hemorrhagic) thrombocythemia: Secondary | ICD-10-CM | POA: Diagnosis not present

## 2019-09-06 DIAGNOSIS — Z9889 Other specified postprocedural states: Secondary | ICD-10-CM

## 2019-09-06 DIAGNOSIS — Z8719 Personal history of other diseases of the digestive system: Secondary | ICD-10-CM | POA: Diagnosis not present

## 2019-09-06 DIAGNOSIS — E559 Vitamin D deficiency, unspecified: Secondary | ICD-10-CM | POA: Diagnosis not present

## 2019-09-06 DIAGNOSIS — Z79899 Other long term (current) drug therapy: Secondary | ICD-10-CM

## 2019-09-06 DIAGNOSIS — Z87442 Personal history of urinary calculi: Secondary | ICD-10-CM

## 2019-09-06 DIAGNOSIS — I10 Essential (primary) hypertension: Secondary | ICD-10-CM | POA: Diagnosis not present

## 2019-09-06 DIAGNOSIS — Z862 Personal history of diseases of the blood and blood-forming organs and certain disorders involving the immune mechanism: Secondary | ICD-10-CM

## 2019-09-06 DIAGNOSIS — M5030 Other cervical disc degeneration, unspecified cervical region: Secondary | ICD-10-CM

## 2019-09-06 DIAGNOSIS — Z970 Presence of artificial eye: Secondary | ICD-10-CM

## 2019-09-06 DIAGNOSIS — D75839 Thrombocytosis, unspecified: Secondary | ICD-10-CM

## 2019-09-06 MED ORDER — PREDNISONE 5 MG PO TABS: 5.0000 mg | ORAL_TABLET | Freq: Every day | ORAL | 0 refills | Status: DC

## 2019-09-06 MED ORDER — METHOTREXATE 2.5 MG PO TABS: 10.0000 mg | ORAL_TABLET | ORAL | 0 refills | Status: DC

## 2019-09-06 MED ORDER — FOLIC ACID 1 MG PO TABS: 1.0000 mg | ORAL_TABLET | Freq: Every day | ORAL | 0 refills | Status: DC

## 2019-09-06 NOTE — Patient Instructions (Addendum)
Standing Labs We placed an order today for your standing lab work.    Please come back and get your standing labs in 2 weeks, 2 months and then every 3 months.   We have open lab daily Monday through Thursday from 8:30-12:30 PM and 1:30-4:30 PM and Friday from 8:30-12:30 PM and 1:30-4:00 PM at the office of Dr. Bo Merino.   You may experience shorter wait times on Monday and Friday afternoons. The office is located at [address], [place], [place], [place] [address] No appointment is necessary.   Labs are drawn by [facility].  You may receive a bill from [facility] for your lab work.  If you wish to have your labs drawn at another location, please call the office 24 hours in advance to send orders.  If you have any questions regarding directions or hours of operation,  please call [phone].   Just as a reminder please drink plenty of water prior to coming for your lab work. Thanks!   Methotrexate tablets What is this medicine? METHOTREXATE (METH oh TREX ate) is a chemotherapy drug used to treat cancer including breast cancer, leukemia, and lymphoma. This medicine can also be used to treat psoriasis and certain kinds of arthritis. This medicine may be used for other purposes; ask your health care provider or pharmacist if you have questions. COMMON BRAND NAME(S): Rheumatrex, Trexall What should I tell my health care provider before I take this medicine? They need to know if you have any of these conditions:  fluid in the stomach area or lungs  if you often drink alcohol  infection or immune system problems  kidney disease or on hemodialysis  liver disease  low blood counts, like low white cell, platelet, or red cell counts  lung disease  radiation therapy  stomach ulcers  ulcerative colitis  an unusual or allergic reaction to methotrexate, other medicines, foods, dyes, or preservatives  pregnant or trying to get pregnant  breast-feeding How should I use  this medicine? Take this medicine by mouth with a glass of water. Follow the directions on the prescription label. Take your medicine at regular intervals. Do not take it more often than directed. Do not stop taking except on your doctor's advice. Make sure you know why you are taking this medicine and how often you should take it. If this medicine is used for a condition that is not cancer, like arthritis or psoriasis, it should be taken weekly, NOT daily. Taking this medicine more often than directed can cause serious side effects, even death. Talk to your healthcare provider about safe handling and disposal of this medicine. You may need to take special precautions. Talk to your pediatrician regarding the use of this medicine in children. While this drug may be prescribed for selected conditions, precautions do apply. Overdosage: If you think you have taken too much of this medicine contact a poison control center or emergency room at once. NOTE: This medicine is only for you. Do not share this medicine with others. What if I miss a dose? If you miss a dose, talk with your doctor or health care professional. Do not take double or extra doses. What may interact with this medicine? This medicine may interact with the following medication:  acitretin  aspirin and aspirin-like medicines including salicylates  azathioprine  certain antibiotics like penicillins, tetracycline, and chloramphenicol  cyclosporine  gold  hydroxychloroquine  live virus vaccines  NSAIDs, medicines for pain and inflammation, like ibuprofen or naproxen  other  cytotoxic agents  penicillamine  phenylbutazone  phenytoin  probenecid  retinoids such as isotretinoin and tretinoin  steroid medicines like prednisone or cortisone  sulfonamides like sulfasalazine and trimethoprim/sulfamethoxazole  theophylline This list may not describe all possible interactions. Give your health care provider a list of all  the medicines, herbs, non-prescription drugs, or dietary supplements you use. Also tell them if you smoke, drink alcohol, or use illegal drugs. Some items may interact with your medicine. What should I watch for while using this medicine? Avoid alcoholic drinks. This medicine can make you more sensitive to the sun. Keep out of the sun. If you cannot avoid being in the sun, wear protective clothing and use sunscreen. Do not use sun lamps or tanning beds/booths. You may need blood work done while you are taking this medicine. Call your doctor or health care professional for advice if you get a fever, chills or sore throat, or other symptoms of a cold or flu. Do not treat yourself. This drug decreases your body's ability to fight infections. Try to avoid being around people who are sick. This medicine may increase your risk to bruise or bleed. Call your doctor or health care professional if you notice any unusual bleeding. Check with your doctor or health care professional if you get an attack of severe diarrhea, nausea and vomiting, or if you sweat a lot. The loss of too much body fluid can make it dangerous for you to take this medicine. Talk to your doctor about your risk of cancer. You may be more at risk for certain types of cancers if you take this medicine. Both men and women must use effective birth control with this medicine. Do not become pregnant while taking this medicine or until at least 1 normal menstrual cycle has occurred after stopping it. Women should inform their doctor if they wish to become pregnant or think they might be pregnant. Men should not father a child while taking this medicine and for 3 months after stopping it. There is a potential for serious side effects to an unborn child. Talk to your health care professional or pharmacist for more information. Do not breast-feed an infant while taking this medicine. What side effects may I notice from receiving this medicine? Side  effects that you should report to your doctor or health care professional as soon as possible:  allergic reactions like skin rash, itching or hives, swelling of the face, lips, or tongue  breathing problems or shortness of breath  diarrhea  dry, nonproductive cough  low blood counts - this medicine may decrease the number of white blood cells, red blood cells and platelets. You may be at increased risk for infections and bleeding.  mouth sores  redness, blistering, peeling or loosening of the skin, including inside the mouth  signs of infection - fever or chills, cough, sore throat, pain or trouble passing urine  signs and symptoms of bleeding such as bloody or black, tarry stools; red or dark-brown urine; spitting up blood or brown material that looks like coffee grounds; red spots on the skin; unusual bruising or bleeding from the eye, gums, or nose  signs and symptoms of kidney injury like trouble passing urine or change in the amount of urine  signs and symptoms of liver injury like dark yellow or brown urine; general ill feeling or flu-like symptoms; light-colored stools; loss of appetite; nausea; right upper belly pain; unusually weak or tired; yellowing of the eyes or skin Side effects that usually  do not require medical attention (report to your doctor or health care professional if they continue or are bothersome):  dizziness  hair loss  tiredness  upset stomach  vomiting This list may not describe all possible side effects. Call your doctor for medical advice about side effects. You may report side effects to FDA at 1-800-FDA-1088. Where should I keep my medicine? Keep out of the reach of children. Store at room temperature between 20 and 25 degrees C (68 and 77 degrees F). Protect from light. Throw away any unused medicine after the expiration date. NOTE: This sheet is a summary. It may not cover all possible information. If you have questions about this medicine,  talk to your doctor, pharmacist, or health care provider.   Back Exercises These exercises help to make your trunk and back strong. They also help to keep the lower back flexible. Doing these exercises can help to prevent back pain or lessen existing pain.  If you have back pain, try to do these exercises 2-3 times each day or as told by your doctor.  As you get better, do the exercises once each day. Repeat the exercises more often as told by your doctor.  To stop back pain from coming back, do the exercises once each day, or as told by your doctor. Exercises Single knee to chest Do these steps 3-5 times in a row for each leg: 1. Lie on your back on a firm bed or the floor with your legs stretched out. 2. Bring one knee to your chest. 3. Grab your knee or thigh with both hands and hold them it in place. 4. Pull on your knee until you feel a gentle stretch in your lower back or buttocks. 5. Keep doing the stretch for 10-30 seconds. 6. Slowly let go of your leg and straighten it. Pelvic tilt Do these steps 5-10 times in a row: 1. Lie on your back on a firm bed or the floor with your legs stretched out. 2. Bend your knees so they point up to the ceiling. Your feet should be flat on the floor. 3. Tighten your lower belly (abdomen) muscles to press your lower back against the floor. This will make your tailbone point up to the ceiling instead of pointing down to your feet or the floor. 4. Stay in this position for 5-10 seconds while you gently tighten your muscles and breathe evenly. Cat-cow Do these steps until your lower back bends more easily: 1. Get on your hands and knees on a firm surface. Keep your hands under your shoulders, and keep your knees under your hips. You may put padding under your knees. 2. Let your head hang down toward your chest. Tighten (contract) the muscles in your belly. Point your tailbone toward the floor so your lower back becomes rounded like the back of a  cat. 3. Stay in this position for 5 seconds. 4. Slowly lift your head. Let the muscles of your belly relax. Point your tailbone up toward the ceiling so your back forms a sagging arch like the back of a cow. 5. Stay in this position for 5 seconds.  Press-ups Do these steps 5-10 times in a row: 1. Lie on your belly (face-down) on the floor. 2. Place your hands near your head, about shoulder-width apart. 3. While you keep your back relaxed and keep your hips on the floor, slowly straighten your arms to raise the top half of your body and lift your shoulders. Do not  use your back muscles. You may change where you place your hands in order to make yourself more comfortable. 4. Stay in this position for 5 seconds. 5. Slowly return to lying flat on the floor.  Bridges Do these steps 10 times in a row: 1. Lie on your back on a firm surface. 2. Bend your knees so they point up to the ceiling. Your feet should be flat on the floor. Your arms should be flat at your sides, next to your body. 3. Tighten your butt muscles and lift your butt off the floor until your waist is almost as high as your knees. If you do not feel the muscles working in your butt and the back of your thighs, slide your feet 1-2 inches farther away from your butt. 4. Stay in this position for 3-5 seconds. 5. Slowly lower your butt to the floor, and let your butt muscles relax. If this exercise is too easy, try doing it with your arms crossed over your chest. Belly crunches Do these steps 5-10 times in a row: 1. Lie on your back on a firm bed or the floor with your legs stretched out. 2. Bend your knees so they point up to the ceiling. Your feet should be flat on the floor. 3. Cross your arms over your chest. 4. Tip your chin a little bit toward your chest but do not bend your neck. 5. Tighten your belly muscles and slowly raise your chest just enough to lift your shoulder blades a tiny bit off of the floor. Avoid raising your  body higher than that, because it can put too much stress on your low back. 6. Slowly lower your chest and your head to the floor. Back lifts Do these steps 5-10 times in a row: 1. Lie on your belly (face-down) with your arms at your sides, and rest your forehead on the floor. 2. Tighten the muscles in your legs and your butt. 3. Slowly lift your chest off of the floor while you keep your hips on the floor. Keep the back of your head in line with the curve in your back. Look at the floor while you do this. 4. Stay in this position for 3-5 seconds. 5. Slowly lower your chest and your face to the floor. Contact a doctor if:  Your back pain gets a lot worse when you do an exercise.  Your back pain does not get better 2 hours after you exercise. If you have any of these problems, stop doing the exercises. Do not do them again unless your doctor says it is okay. Get help right away if:  You have sudden, very bad back pain. If this happens, stop doing the exercises. Do not do them again unless your doctor says it is okay. This information is not intended to replace advice given to you by your health care provider. Make sure you discuss any questions you have with your health care provider. Document Released: 10/22/2010 Document Revised: 06/14/2018 Document Reviewed: 06/14/2018 Elsevier Patient Education  [employer].   2020 Elsevier/Gold Standard (2017-05-11 13:38:43)

## 2019-09-06 NOTE — Telephone Encounter (Signed)
Pt would like to know the results of his CT from 08/09/19. Christen Bame, CMA

## 2019-09-09 ENCOUNTER — Other Ambulatory Visit: Payer: Self-pay

## 2019-09-09 ENCOUNTER — Ambulatory Visit (INDEPENDENT_AMBULATORY_CARE_PROVIDER_SITE_OTHER): Payer: Medicare Other | Admitting: Family Medicine

## 2019-09-09 DIAGNOSIS — M545 Low back pain, unspecified: Secondary | ICD-10-CM

## 2019-09-09 HISTORY — DX: Low back pain, unspecified: M54.50

## 2019-09-09 MED ORDER — TIZANIDINE HCL 4 MG PO TABS: 4.0000 mg | ORAL_TABLET | Freq: Four times a day (QID) | ORAL | 0 refills | Status: DC | PRN

## 2019-09-09 NOTE — Assessment & Plan Note (Signed)
Most likely acute on chronic. Given recent increase in activity with yard work and pain starting after this is the most likely inciting event. History of OA of the spine with sciatica makes this acute on chronic exacerbation of his OA. Give no falls or trauma unlikely to be a fracture of the sacrum - Cont OTC Advil as needed not to exceed 800mg  every 6hrs - Ice as needed - Zanaflex at night to start and then QID PRN; if no improvement in 48-72 hours or its worse return to clinic.

## 2019-09-09 NOTE — Progress Notes (Signed)
     Subjective: HPI: Brian Leon is a 73 y.o. presenting to clinic today to discuss the following:  Low Back Pain Patient is a 73y/o male with PMH of OA of the spine with sciatica and osteoporosis presenting with low back pain on the right side radiating down to his right leg that started after Thanksgiving after doing yard work. He states it doesn't feel like his normal sciatica pain but like "bone pain". He is worried he "broke his pelvis" but he denies any trauma, falls, or accidents. The pain is constant with minimal relief with Advil. Laying down flat and extending his leg makes it worse. No urinary pain, no difficulty making it to the restroom, some mild nausea when pain is severe but no vomiting. No blood in the urine. No bruising or skin changes or swelling in the low back.  ROS noted in HPI.    Social History   Tobacco Use  Smoking Status Former Smoker  . Years: 20.00  . Types: Cigarettes  . Quit date: 12/13/1983  . Years since quitting: 35.7  Smokeless Tobacco Never Used    Objective: BP (!) 145/80   Pulse 79   Wt 131 lb (59.4 kg)   SpO2 98%   BMI 19.07 kg/m  Vitals and nursing notes reviewed  Physical Exam Lumbar spine: - Inspection: no gross deformity or asymmetry, swelling or ecchymosis. No skin changes - Palpation: No TTP over the spinous processes, paraspinal muscles, or SI joints b/l - ROM: full active ROM of the lumbar spine in flexion and extension without pain - Strength: 5/5 strength of lower extremity in L4-S1 nerve root distributions b/l - Neuro: sensation intact in the L4-S1 nerve root distribution b/l, 2+ L4 and S1 reflexes - Special testing: Negative straight leg raise  Assessment/Plan:  Low back pain Most likely acute on chronic. Given recent increase in activity with yard work and pain starting after this is the most likely inciting event. History of OA of the spine with sciatica makes this acute on chronic exacerbation of his OA. Give no  falls or trauma unlikely to be a fracture of the sacrum - Cont OTC Advil as needed not to exceed 800mg  every 6hrs - Ice as needed - Zanaflex at night to start and then QID PRN; if no improvement in 48-72 hours or its worse return to clinic.   PATIENT EDUCATION PROVIDED: See AVS    Diagnosis and plan along with any newly prescribed medication(s) were discussed in detail with this patient today. The patient verbalized understanding and agreed with the plan. Patient advised if symptoms worsen return to clinic or ER.    Meds ordered this encounter  Medications  . tiZANidine (ZANAFLEX) 4 MG tablet    Sig: Take 1 tablet (4 mg total) by mouth every 6 (six) hours as needed for muscle spasms.    Dispense:  30 tablet    Refill:  0     Harolyn Rutherford, DO 09/09/2019, 1:41 PM PGY-3 [place]

## 2019-09-09 NOTE — Patient Instructions (Addendum)
It was great to meet you today! Thank you for letting me participate in your care!  Today, we discussed your low back pain which I think is due to an acute muscle strain. I have given you a muscle relaxer to help. I think you should still take Aleve as needed but don't exceed more than 4 tablets every 6 hours. Please use ice and/or heat as needed.  If you have no improvement or develop urinary symptoms please return to clinic in 3 days. If you get some improvement but have lingering issues 2-3 weeks later please follow up with [place].  Be well, Harolyn Rutherford, DO PGY-3, [facility]

## 2019-09-10 NOTE — Telephone Encounter (Signed)
Called patient. Went to straight to [employer]. Asked to call back. Patient will need to leave a more reliable number for me to reach him. Thank you.

## 2019-09-11 ENCOUNTER — Telehealth (INDEPENDENT_AMBULATORY_CARE_PROVIDER_SITE_OTHER): Payer: Medicare Other | Admitting: Family Medicine

## 2019-09-11 ENCOUNTER — Telehealth: Payer: Self-pay | Admitting: *Deleted

## 2019-09-11 ENCOUNTER — Other Ambulatory Visit: Payer: Self-pay

## 2019-09-11 DIAGNOSIS — M5441 Lumbago with sciatica, right side: Secondary | ICD-10-CM

## 2019-09-11 DIAGNOSIS — M5440 Lumbago with sciatica, unspecified side: Secondary | ICD-10-CM

## 2019-09-11 MED ORDER — GABAPENTIN 100 MG PO CAPS: 100.0000 mg | ORAL_CAPSULE | Freq: Three times a day (TID) | ORAL | 0 refills | Status: DC

## 2019-09-11 NOTE — Progress Notes (Signed)
[place] Telemedicine Visit  Patient consented to have visit conducted via telephone/video 2 separate identifiers used to confirm identity.  Declined attempt at video call.  Said he felt too bad to "mess with that"  Encounter participants: Patient: Brian Leon  Provider: Dorcas Mcmurray  Others (if applicable): none  Chief Complaint / HPI:  Continued lower extremity pain.  Was seen at the office on the seventh for same symptoms.  He says they have not really worsened but they have not gotten any better and the medication has not been helpful.  He is having a lot of trouble sleeping.  Describes pain as starting in the low back radiating down the right leg.  Does not usually radiate past the knee but on occasion has gone to the foot.  Pain is aching and sharp with some burning symptoms at times.  At times the pain is worse if he sits for any length of time and has had trouble sitting on the toilet for this reason.  No urinary incontinence, no urinary hesitancy, has had trouble sitting on the toilet long enough to have a bowel movement.  At that episode, he had to go back to bed and rest a while before he was able to go back and have successful bowel movement.  He is greatly concerned because the pain has not improved.  He thinks diagnosis is probably correct but would like something for the pain.  ROS: No fever No productive cough.  See HPI  OBJECTIVE observed via telephone device: PSYCH: AxOx4.  Appropriate speech fluency and content. Asks and answers questions appropriately. Mood is congruent. RESPIRATORY: no unusual sounds of labored breathing    Pertinent PMHx / PSHx:  I have reviewed the patient's medications, allergies, past medical and surgical history, smoking status and updated in the EMR as appropriate.   Assessment/Plan:  Low back pain He does not describe any new symptoms although he does admit once he fell.  He is not sure if he fell because it was  just hurting so bad or if his leg gave out.  I offered to go ahead and start additional work-up with imaging ,but he says he really does not want to have to go out for any appointments.  Ultimately, we agreed to start him on gabapentin for additional pain and sym[ptom relief  .  If he is having worsening symptoms (and I reviewed red flags such as incontinence, worsening pain, lower extremity weakness or numbness)  he needs to be seen urgently at the emergency department.  If he is having no improvement by Monday, he agrees to call and have a in person office visit.  His symptoms seem somewhat consistent with H&P and potentially we could additionally start steroid Dosepak.  He will let us know.    Time spent on phone or on video call with patient: 23 minutes

## 2019-09-11 NOTE — Assessment & Plan Note (Signed)
He does not describe any new symptoms although he does admit once he fell.  He is not sure if he fell because it was just hurting so bad or if his leg gave out.  I offered to go ahead and start additional work-up with imaging ,but he says he really does not want to have to go out for any appointments.  Ultimately, we agreed to start him on gabapentin for additional pain and sym[ptom relief  .  If he is having worsening symptoms (and I reviewed red flags such as incontinence, worsening pain, lower extremity weakness or numbness)  he needs to be seen urgently at the emergency department.  If he is having no improvement by Monday, he agrees to call and have a in person office visit.  His symptoms seem somewhat consistent with H&P and potentially we could additionally start steroid Dosepak.  He will let us know.

## 2019-09-11 NOTE — Telephone Encounter (Signed)
Pt calls back to let Dr. Nori Riis know that he has changed his mind and would like to get some imaging. Christen Bame, CMA

## 2019-09-13 ENCOUNTER — Ambulatory Visit
Admission: RE | Admit: 2019-09-13 | Discharge: 2019-09-13 | Disposition: A | Discharge status: Home / Self Care | Payer: Medicare Other | Source: Ambulatory Visit | Attending: Family Medicine | Admitting: Family Medicine

## 2019-09-13 DIAGNOSIS — M545 Low back pain: Secondary | ICD-10-CM | POA: Diagnosis not present

## 2019-09-13 DIAGNOSIS — M5440 Lumbago with sciatica, unspecified side: Secondary | ICD-10-CM

## 2019-09-13 NOTE — Telephone Encounter (Signed)
Dear Dema Severin Team I have placed order for low back X ray at [place]. Please tell him he can go today before 4:30 (at least that is when I think they close) [facility]! Dorcas Mcmurray

## 2019-09-13 NOTE — Telephone Encounter (Signed)
Pt calling to check status. Chrystel Barefield, CMA  

## 2019-09-13 NOTE — Telephone Encounter (Signed)
Patient is aware that he can go get this xray.   He also asked that his pcp call him and discuss the results from his CT in November.  Iram Astorino,CMA

## 2019-09-13 NOTE — Telephone Encounter (Signed)
Called pt left a detailed message of CT results of stable aortic aneurysm, dilatation is 4.3 cm and recommendation of annual follow up with subsequent imaging. Nothing to do further at this time.

## 2019-09-17 ENCOUNTER — Telehealth: Payer: Self-pay

## 2019-09-17 ENCOUNTER — Telehealth: Payer: Self-pay | Admitting: Family Medicine

## 2019-09-17 DIAGNOSIS — R29898 Other symptoms and signs involving the musculoskeletal system: Secondary | ICD-10-CM

## 2019-09-17 DIAGNOSIS — W19XXXA Unspecified fall, initial encounter: Secondary | ICD-10-CM

## 2019-09-17 DIAGNOSIS — M5441 Lumbago with sciatica, right side: Secondary | ICD-10-CM

## 2019-09-17 NOTE — Telephone Encounter (Signed)
Pt calling to get results from x-ray on back. [phone]. Ottis Stain, CMA

## 2019-09-17 NOTE — Telephone Encounter (Signed)
Contacted [facility] to schedule MRI and it appears that pt may have already called and he is currently on the list if there are any cancellations there due to them scheduling out so far.Mariadejesus Cade Zimmerman Rumple, CMA

## 2019-09-17 NOTE — Telephone Encounter (Signed)
Called pt and discussed his X rays which show old compression fx and lots of arthritic change but nothing acute. His pain overall is significantly improved buthis RIGHT leg has significant weakness and he has had a couple of falls. We discussed. I am concerned for HNP and I think given his weakness and falls we absolutely need to consider MRI. Will order. He is [place] with the plan. Discussed what to do with acute worsening of sx (increased weakness, recurrence of very severe pain, numbness or loss of feeling;go to ER and notify us ASAP).   MRI orders placed and I will contact him with results.  Also reviewed with him the results of CTA for eval of known aneurysm. Remains stable at 4.2 cm and recommend annual surveillance. He is reassured and will follow up appropriately.

## 2019-09-19 ENCOUNTER — Ambulatory Visit
Admission: RE | Admit: 2019-09-19 | Discharge: 2019-09-19 | Disposition: A | Discharge status: Home / Self Care | Payer: Medicare Other | Source: Ambulatory Visit | Attending: Family Medicine | Admitting: Family Medicine

## 2019-09-19 ENCOUNTER — Other Ambulatory Visit: Payer: Self-pay

## 2019-09-19 DIAGNOSIS — R29898 Other symptoms and signs involving the musculoskeletal system: Secondary | ICD-10-CM

## 2019-09-19 DIAGNOSIS — M5441 Lumbago with sciatica, right side: Secondary | ICD-10-CM

## 2019-09-19 DIAGNOSIS — W19XXXA Unspecified fall, initial encounter: Secondary | ICD-10-CM

## 2019-09-20 ENCOUNTER — Telehealth: Payer: Self-pay

## 2019-09-20 ENCOUNTER — Other Ambulatory Visit: Payer: Self-pay | Admitting: Family Medicine

## 2019-09-20 DIAGNOSIS — R29898 Other symptoms and signs involving the musculoskeletal system: Secondary | ICD-10-CM

## 2019-09-20 DIAGNOSIS — W19XXXA Unspecified fall, initial encounter: Secondary | ICD-10-CM

## 2019-09-20 DIAGNOSIS — M5441 Lumbago with sciatica, right side: Secondary | ICD-10-CM

## 2019-09-20 NOTE — Progress Notes (Signed)
Discussed w pt his mri results Sent referral to [facility]; Dr Vertell Limber did his neck surgery so requested him

## 2019-09-20 NOTE — Telephone Encounter (Signed)
Patient calling for MRI results. Please advise

## 2019-09-30 ENCOUNTER — Other Ambulatory Visit: Payer: Self-pay | Admitting: Family Medicine

## 2019-09-30 DIAGNOSIS — R058 Other specified cough: Secondary | ICD-10-CM

## 2019-09-30 DIAGNOSIS — R05 Cough: Secondary | ICD-10-CM

## 2019-10-14 DIAGNOSIS — M545 Low back pain: Secondary | ICD-10-CM | POA: Diagnosis not present

## 2019-10-14 DIAGNOSIS — Z01818 Encounter for other preprocedural examination: Secondary | ICD-10-CM | POA: Diagnosis not present

## 2019-10-14 DIAGNOSIS — M5126 Other intervertebral disc displacement, lumbar region: Secondary | ICD-10-CM

## 2019-10-14 DIAGNOSIS — M4126 Other idiopathic scoliosis, lumbar region: Secondary | ICD-10-CM | POA: Diagnosis not present

## 2019-10-14 DIAGNOSIS — I10 Essential (primary) hypertension: Secondary | ICD-10-CM | POA: Insufficient documentation

## 2019-10-14 DIAGNOSIS — M5416 Radiculopathy, lumbar region: Secondary | ICD-10-CM | POA: Diagnosis not present

## 2019-10-14 HISTORY — DX: Other intervertebral disc displacement, lumbar region: M51.26

## 2019-10-14 HISTORY — DX: Essential (primary) hypertension: I10

## 2019-10-14 HISTORY — PX: BACK SURGERY: SHX140

## 2019-10-17 DIAGNOSIS — M5136 Other intervertebral disc degeneration, lumbar region: Secondary | ICD-10-CM | POA: Diagnosis not present

## 2019-10-17 DIAGNOSIS — M5116 Intervertebral disc disorders with radiculopathy, lumbar region: Secondary | ICD-10-CM | POA: Diagnosis not present

## 2019-10-17 DIAGNOSIS — M5126 Other intervertebral disc displacement, lumbar region: Secondary | ICD-10-CM | POA: Diagnosis not present

## 2019-10-17 DIAGNOSIS — M4726 Other spondylosis with radiculopathy, lumbar region: Secondary | ICD-10-CM | POA: Diagnosis not present

## 2019-10-22 ENCOUNTER — Other Ambulatory Visit: Payer: Self-pay | Admitting: Family Medicine

## 2019-10-22 DIAGNOSIS — R05 Cough: Secondary | ICD-10-CM

## 2019-10-22 DIAGNOSIS — R058 Other specified cough: Secondary | ICD-10-CM

## 2019-10-25 DIAGNOSIS — Z23 Encounter for immunization: Secondary | ICD-10-CM | POA: Diagnosis not present

## 2019-10-29 ENCOUNTER — Other Ambulatory Visit: Payer: Self-pay | Admitting: Rheumatology

## 2019-10-29 NOTE — Telephone Encounter (Signed)
Last Visit: 09/06/19 Next Visit: 11/22/19  Attempted to contact the patient to verify dose. Left message for patient to call the office.

## 2019-10-29 NOTE — Telephone Encounter (Signed)
Patient states he is currently on prednisone 5mg  daily and will be tapering down to 4mg  daily.   Patient states he is not going to take MTX at this time. Patient thought he was having a flare and he actually "blew his back out". Patient will taper down on prednisone and then see how he feels about adding MTX.
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
## 2019-11-22 ENCOUNTER — Ambulatory Visit: Payer: Medicare Other | Admitting: Rheumatology

## 2019-11-22 DIAGNOSIS — Z23 Encounter for immunization: Secondary | ICD-10-CM | POA: Diagnosis not present

## 2019-11-24 ENCOUNTER — Other Ambulatory Visit: Payer: Self-pay | Admitting: Rheumatology

## 2019-11-25 NOTE — Telephone Encounter (Addendum)
Last Visit: 09/06/19 Next Visit: 12/13/19 Labs: CMP- 08/05/19 BUN 9 Chloride 108  CBC 05/07/19 WBC 10.7, RBC 3.81, Hgb 12.2 Hct 37.9  Left message to advise patient he is due to update labs.   Okay to refill MTX?

## 2019-11-25 NOTE — Telephone Encounter (Signed)
Ok to refill 30 day supply. Please advise patient to update lab work ASAP.

## 2019-11-27 ENCOUNTER — Other Ambulatory Visit: Payer: Self-pay

## 2019-11-27 ENCOUNTER — Telehealth: Payer: Self-pay | Admitting: Rheumatology

## 2019-11-27 DIAGNOSIS — R05 Cough: Secondary | ICD-10-CM

## 2019-11-27 DIAGNOSIS — R058 Other specified cough: Secondary | ICD-10-CM

## 2019-11-27 MED ORDER — ALBUTEROL SULFATE HFA 108 (90 BASE) MCG/ACT IN AERS: 1.0000 | INHALATION_SPRAY | Freq: Four times a day (QID) | RESPIRATORY_TRACT | 0 refills | Status: DC | PRN

## 2019-11-27 NOTE — Telephone Encounter (Signed)
Patient called asking to speak with you directly.

## 2019-11-27 NOTE — Telephone Encounter (Signed)
Patient states he is not on MTX and asked if he will still need labs. Patient advised he will not currently need labs and may talk to the doctor his follow up visit.

## 2019-11-28 ENCOUNTER — Other Ambulatory Visit: Payer: Self-pay | Admitting: Family Medicine

## 2019-11-30 ENCOUNTER — Other Ambulatory Visit: Payer: Self-pay | Admitting: Rheumatology

## 2019-12-04 ENCOUNTER — Other Ambulatory Visit: Payer: Self-pay | Admitting: *Deleted

## 2019-12-05 MED ORDER — PANTOPRAZOLE SODIUM 20 MG PO TBEC: 20.0000 mg | DELAYED_RELEASE_TABLET | Freq: Every day | ORAL | 3 refills | Status: DC
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
## 2019-12-13 ENCOUNTER — Ambulatory Visit (INDEPENDENT_AMBULATORY_CARE_PROVIDER_SITE_OTHER): Payer: Medicare Other | Admitting: Rheumatology

## 2019-12-13 ENCOUNTER — Other Ambulatory Visit: Payer: Self-pay

## 2019-12-13 ENCOUNTER — Encounter: Payer: Self-pay | Admitting: Rheumatology

## 2019-12-13 VITALS — BP 144/84 | HR 75 | Resp 15 | Ht 69.5 in | Wt 146.8 lb

## 2019-12-13 DIAGNOSIS — M810 Age-related osteoporosis without current pathological fracture: Secondary | ICD-10-CM

## 2019-12-13 DIAGNOSIS — Z7952 Long term (current) use of systemic steroids: Secondary | ICD-10-CM

## 2019-12-13 DIAGNOSIS — D75839 Thrombocytosis, unspecified: Secondary | ICD-10-CM

## 2019-12-13 DIAGNOSIS — M5030 Other cervical disc degeneration, unspecified cervical region: Secondary | ICD-10-CM

## 2019-12-13 DIAGNOSIS — Z872 Personal history of diseases of the skin and subcutaneous tissue: Secondary | ICD-10-CM | POA: Diagnosis not present

## 2019-12-13 DIAGNOSIS — R911 Solitary pulmonary nodule: Secondary | ICD-10-CM

## 2019-12-13 DIAGNOSIS — Z8719 Personal history of other diseases of the digestive system: Secondary | ICD-10-CM

## 2019-12-13 DIAGNOSIS — M353 Polymyalgia rheumatica: Secondary | ICD-10-CM | POA: Diagnosis not present

## 2019-12-13 DIAGNOSIS — Z9889 Other specified postprocedural states: Secondary | ICD-10-CM

## 2019-12-13 DIAGNOSIS — N132 Hydronephrosis with renal and ureteral calculous obstruction: Secondary | ICD-10-CM

## 2019-12-13 DIAGNOSIS — Z862 Personal history of diseases of the blood and blood-forming organs and certain disorders involving the immune mechanism: Secondary | ICD-10-CM

## 2019-12-13 DIAGNOSIS — I10 Essential (primary) hypertension: Secondary | ICD-10-CM

## 2019-12-13 DIAGNOSIS — E559 Vitamin D deficiency, unspecified: Secondary | ICD-10-CM | POA: Diagnosis not present

## 2019-12-13 DIAGNOSIS — Z970 Presence of artificial eye: Secondary | ICD-10-CM

## 2019-12-13 DIAGNOSIS — Z79899 Other long term (current) drug therapy: Secondary | ICD-10-CM | POA: Diagnosis not present

## 2019-12-13 DIAGNOSIS — I712 Thoracic aortic aneurysm, without rupture, unspecified: Secondary | ICD-10-CM

## 2019-12-13 DIAGNOSIS — D473 Essential (hemorrhagic) thrombocythemia: Secondary | ICD-10-CM | POA: Diagnosis not present

## 2019-12-13 DIAGNOSIS — Z87442 Personal history of urinary calculi: Secondary | ICD-10-CM

## 2019-12-13 NOTE — Patient Instructions (Signed)
Take prednisone 3 mg for 2 months, 2 mg for 2 months, and 1 mg for 2 months

## 2019-12-17 ENCOUNTER — Other Ambulatory Visit: Payer: Self-pay | Admitting: Physician Assistant

## 2019-12-18 ENCOUNTER — Other Ambulatory Visit: Payer: Self-pay

## 2019-12-18 DIAGNOSIS — Z79899 Other long term (current) drug therapy: Secondary | ICD-10-CM | POA: Diagnosis not present

## 2019-12-18 NOTE — Telephone Encounter (Signed)
Last Visit: 12/13/19 Next Visit: 04/16/20 Labs: 08/05/19 Chloride 108 BUN/Creat. Ratio 9, 05/07/19 WBC 10.7, RBC 3.81, Hgb 12.2, Hct 37.9  Left message to advise patient he is due to update labs  Okay to refill 30 day supply Fosamax?

## 2019-12-18 NOTE — Telephone Encounter (Signed)
Lab work was drawn today and should result tomorrow.  If labs are stable ok to send in 90 day supply.

## 2019-12-19 LAB — CBC WITH DIFFERENTIAL/PLATELET
Absolute Monocytes: 836 cells/uL (ref 200–950)
Basophils Absolute: 79 cells/uL (ref 0–200)
Basophils Relative: 0.7 %
Eosinophils Absolute: 701 cells/uL — ABNORMAL HIGH (ref 15–500)
Eosinophils Relative: 6.2 %
HCT: 39 % (ref 38.5–50.0)
Hemoglobin: 12.8 g/dL — ABNORMAL LOW (ref 13.2–17.1)
Lymphs Abs: 2317 cells/uL (ref 850–3900)
MCH: 32.4 pg (ref 27.0–33.0)
MCHC: 32.8 g/dL (ref 32.0–36.0)
MCV: 98.7 fL (ref 80.0–100.0)
MPV: 8.7 fL (ref 7.5–12.5)
Monocytes Relative: 7.4 %
Neutro Abs: 7368 cells/uL (ref 1500–7800)
Neutrophils Relative %: 65.2 %
Platelets: 421 10*3/uL — ABNORMAL HIGH (ref 140–400)
RBC: 3.95 10*6/uL — ABNORMAL LOW (ref 4.20–5.80)
RDW: 12.5 % (ref 11.0–15.0)
Total Lymphocyte: 20.5 %
WBC: 11.3 10*3/uL — ABNORMAL HIGH (ref 3.8–10.8)

## 2019-12-19 LAB — COMPLETE METABOLIC PANEL WITH GFR
AG Ratio: 1.6 (calc) (ref 1.0–2.5)
ALT: 14 U/L (ref 9–46)
AST: 15 U/L (ref 10–35)
Albumin: 3.9 g/dL (ref 3.6–5.1)
Alkaline phosphatase (APISO): 62 U/L (ref 35–144)
BUN: 17 mg/dL (ref 7–25)
CO2: 25 mmol/L (ref 20–32)
Calcium: 9.4 mg/dL (ref 8.6–10.3)
Chloride: 108 mmol/L (ref 98–110)
Creat: 1.05 mg/dL (ref 0.70–1.18)
GFR, Est African American: 80 mL/min/{1.73_m2} (ref >=60)
GFR, Est Non African American: 69 mL/min/{1.73_m2} (ref >=60)
Globulin: 2.4 g/dL (calc) (ref 1.9–3.7)
Glucose, Bld: 109 mg/dL — ABNORMAL HIGH (ref 65–99)
Potassium: 4.1 mmol/L (ref 3.5–5.3)
Sodium: 141 mmol/L (ref 135–146)
Total Bilirubin: 0.6 mg/dL (ref 0.2–1.2)
Total Protein: 6.3 g/dL (ref 6.1–8.1)

## 2019-12-19 NOTE — Telephone Encounter (Signed)
Received labs. Labs are stable.

## 2019-12-19 NOTE — Progress Notes (Signed)
Labs are stable.

## 2019-12-24 DIAGNOSIS — N200 Calculus of kidney: Secondary | ICD-10-CM | POA: Diagnosis not present

## 2019-12-24 DIAGNOSIS — C641 Malignant neoplasm of right kidney, except renal pelvis: Secondary | ICD-10-CM | POA: Diagnosis not present

## 2019-12-24 DIAGNOSIS — R350 Frequency of micturition: Secondary | ICD-10-CM | POA: Diagnosis not present

## 2020-01-02 ENCOUNTER — Other Ambulatory Visit: Payer: Self-pay | Admitting: Family Medicine

## 2020-01-02 DIAGNOSIS — R05 Cough: Secondary | ICD-10-CM

## 2020-01-02 DIAGNOSIS — R058 Other specified cough: Secondary | ICD-10-CM

## 2020-01-20 ENCOUNTER — Other Ambulatory Visit: Payer: Self-pay | Admitting: Family Medicine

## 2020-01-20 DIAGNOSIS — R05 Cough: Secondary | ICD-10-CM

## 2020-01-20 DIAGNOSIS — R058 Other specified cough: Secondary | ICD-10-CM

## 2020-03-04 DIAGNOSIS — Z85828 Personal history of other malignant neoplasm of skin: Secondary | ICD-10-CM | POA: Diagnosis not present

## 2020-03-04 DIAGNOSIS — Z79899 Other long term (current) drug therapy: Secondary | ICD-10-CM | POA: Diagnosis not present

## 2020-03-04 DIAGNOSIS — L2081 Atopic neurodermatitis: Secondary | ICD-10-CM | POA: Diagnosis not present

## 2020-03-04 DIAGNOSIS — L570 Actinic keratosis: Secondary | ICD-10-CM | POA: Diagnosis not present

## 2020-03-05 ENCOUNTER — Other Ambulatory Visit: Payer: Self-pay | Admitting: Family Medicine

## 2020-03-05 ENCOUNTER — Other Ambulatory Visit: Payer: Self-pay | Admitting: *Deleted

## 2020-03-05 MED ORDER — PREDNISONE 1 MG PO TABS: 4.0000 mg | ORAL_TABLET | Freq: Every day | ORAL | 0 refills | Status: DC

## 2020-03-05 NOTE — Telephone Encounter (Signed)
Ok to refill prescription for prednisone 1 mg 4 tablets by mouth daily.

## 2020-03-05 NOTE — Telephone Encounter (Signed)
Patient states he tried to taper to 2 mg of prednisone and was having increase joint pain. Patient increased his Prednisone back to 4 mg.  Last Visit: 12/13/2019 Next Visit: 04/16/2020  Okay to refill Prednisone?

## 2020-03-12 ENCOUNTER — Other Ambulatory Visit: Payer: Self-pay | Admitting: Rheumatology

## 2020-03-12 NOTE — Telephone Encounter (Signed)
Last Visit: 12/13/2019 Next Visit: 04/16/2020 Labs: 12/18/2019 stable   Current Dose per office note on 12/13/2019: Fosamax 70 mg by mouth once weekly.  Okay to refill per Dr. Estanislado Pandy

## 2020-03-18 ENCOUNTER — Other Ambulatory Visit: Payer: Self-pay | Admitting: Family Medicine

## 2020-03-18 DIAGNOSIS — R058 Other specified cough: Secondary | ICD-10-CM

## 2020-03-25 DIAGNOSIS — R1310 Dysphagia, unspecified: Secondary | ICD-10-CM | POA: Insufficient documentation

## 2020-03-25 HISTORY — DX: Dysphagia, unspecified: R13.10
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
## 2020-04-12 ENCOUNTER — Other Ambulatory Visit: Payer: Self-pay | Admitting: Family Medicine

## 2020-04-12 DIAGNOSIS — R058 Other specified cough: Secondary | ICD-10-CM

## 2020-04-13 DIAGNOSIS — M2569 Stiffness of other specified joint, not elsewhere classified: Secondary | ICD-10-CM | POA: Diagnosis not present

## 2020-04-13 DIAGNOSIS — M545 Low back pain: Secondary | ICD-10-CM | POA: Diagnosis not present

## 2020-04-13 DIAGNOSIS — R2689 Other abnormalities of gait and mobility: Secondary | ICD-10-CM | POA: Diagnosis not present

## 2020-04-13 DIAGNOSIS — R531 Weakness: Secondary | ICD-10-CM | POA: Diagnosis not present

## 2020-04-16 ENCOUNTER — Ambulatory Visit: Payer: Medicare Other | Admitting: Rheumatology

## 2020-04-17 DIAGNOSIS — R2689 Other abnormalities of gait and mobility: Secondary | ICD-10-CM | POA: Diagnosis not present

## 2020-04-17 DIAGNOSIS — M2569 Stiffness of other specified joint, not elsewhere classified: Secondary | ICD-10-CM | POA: Diagnosis not present

## 2020-04-17 DIAGNOSIS — R531 Weakness: Secondary | ICD-10-CM | POA: Diagnosis not present

## 2020-04-17 DIAGNOSIS — M545 Low back pain: Secondary | ICD-10-CM | POA: Diagnosis not present
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
## 2020-04-21 DIAGNOSIS — M2569 Stiffness of other specified joint, not elsewhere classified: Secondary | ICD-10-CM | POA: Diagnosis not present

## 2020-04-21 DIAGNOSIS — M545 Low back pain: Secondary | ICD-10-CM | POA: Diagnosis not present

## 2020-04-21 DIAGNOSIS — R2689 Other abnormalities of gait and mobility: Secondary | ICD-10-CM | POA: Diagnosis not present

## 2020-04-21 DIAGNOSIS — R531 Weakness: Secondary | ICD-10-CM | POA: Diagnosis not present

## 2020-04-22 ENCOUNTER — Encounter: Payer: Self-pay | Admitting: Physician Assistant

## 2020-04-22 ENCOUNTER — Other Ambulatory Visit: Payer: Self-pay

## 2020-04-22 ENCOUNTER — Ambulatory Visit (INDEPENDENT_AMBULATORY_CARE_PROVIDER_SITE_OTHER): Payer: Medicare Other | Admitting: Rheumatology

## 2020-04-22 ENCOUNTER — Telehealth: Payer: Self-pay

## 2020-04-22 VITALS — BP 152/87 | HR 62 | Resp 15 | Ht 70.0 in | Wt 139.8 lb

## 2020-04-22 DIAGNOSIS — M4722 Other spondylosis with radiculopathy, cervical region: Secondary | ICD-10-CM

## 2020-04-22 DIAGNOSIS — D473 Essential (hemorrhagic) thrombocythemia: Secondary | ICD-10-CM

## 2020-04-22 DIAGNOSIS — Z8719 Personal history of other diseases of the digestive system: Secondary | ICD-10-CM

## 2020-04-22 DIAGNOSIS — R911 Solitary pulmonary nodule: Secondary | ICD-10-CM | POA: Diagnosis not present

## 2020-04-22 DIAGNOSIS — Z872 Personal history of diseases of the skin and subcutaneous tissue: Secondary | ICD-10-CM | POA: Diagnosis not present

## 2020-04-22 DIAGNOSIS — M19041 Primary osteoarthritis, right hand: Secondary | ICD-10-CM

## 2020-04-22 DIAGNOSIS — Z7952 Long term (current) use of systemic steroids: Secondary | ICD-10-CM | POA: Diagnosis not present

## 2020-04-22 DIAGNOSIS — E559 Vitamin D deficiency, unspecified: Secondary | ICD-10-CM

## 2020-04-22 DIAGNOSIS — D75839 Thrombocytosis, unspecified: Secondary | ICD-10-CM

## 2020-04-22 DIAGNOSIS — I10 Essential (primary) hypertension: Secondary | ICD-10-CM

## 2020-04-22 DIAGNOSIS — Z970 Presence of artificial eye: Secondary | ICD-10-CM

## 2020-04-22 DIAGNOSIS — M810 Age-related osteoporosis without current pathological fracture: Secondary | ICD-10-CM

## 2020-04-22 DIAGNOSIS — I712 Thoracic aortic aneurysm, without rupture, unspecified: Secondary | ICD-10-CM

## 2020-04-22 DIAGNOSIS — Z9889 Other specified postprocedural states: Secondary | ICD-10-CM

## 2020-04-22 DIAGNOSIS — Z79899 Other long term (current) drug therapy: Secondary | ICD-10-CM | POA: Diagnosis not present

## 2020-04-22 DIAGNOSIS — M353 Polymyalgia rheumatica: Secondary | ICD-10-CM | POA: Diagnosis not present

## 2020-04-22 DIAGNOSIS — M5030 Other cervical disc degeneration, unspecified cervical region: Secondary | ICD-10-CM | POA: Diagnosis not present

## 2020-04-22 DIAGNOSIS — M19042 Primary osteoarthritis, left hand: Secondary | ICD-10-CM

## 2020-04-22 DIAGNOSIS — Z862 Personal history of diseases of the blood and blood-forming organs and certain disorders involving the immune mechanism: Secondary | ICD-10-CM

## 2020-04-22 NOTE — Telephone Encounter (Signed)
Brian Leon from [facility], calls nurse line requesting order for bone density scan at the [place]. Patient has had previous imaging performed at the breast center, however, Dr. Estanislado Pandy cannot order, due to conflict of interest. Requesting that PCP place order for bone density scan.   Forwarding to PCP  Talbot Grumbling, RN

## 2020-04-23 ENCOUNTER — Other Ambulatory Visit: Payer: Self-pay | Admitting: Family Medicine

## 2020-04-23 DIAGNOSIS — M545 Low back pain: Secondary | ICD-10-CM | POA: Diagnosis not present

## 2020-04-23 DIAGNOSIS — M2569 Stiffness of other specified joint, not elsewhere classified: Secondary | ICD-10-CM | POA: Diagnosis not present

## 2020-04-23 DIAGNOSIS — R531 Weakness: Secondary | ICD-10-CM | POA: Diagnosis not present

## 2020-04-23 DIAGNOSIS — R2689 Other abnormalities of gait and mobility: Secondary | ICD-10-CM | POA: Diagnosis not present

## 2020-04-23 NOTE — Telephone Encounter (Signed)
Order placed.   Brian Mihalic Autry-Lott, DO 04/23/2020, 3:46 PM PGY-2, [place]

## 2020-04-28 DIAGNOSIS — M2569 Stiffness of other specified joint, not elsewhere classified: Secondary | ICD-10-CM | POA: Diagnosis not present

## 2020-04-28 DIAGNOSIS — M545 Low back pain: Secondary | ICD-10-CM | POA: Diagnosis not present

## 2020-04-28 DIAGNOSIS — R531 Weakness: Secondary | ICD-10-CM | POA: Diagnosis not present

## 2020-04-28 DIAGNOSIS — R2689 Other abnormalities of gait and mobility: Secondary | ICD-10-CM | POA: Diagnosis not present

## 2020-04-29 ENCOUNTER — Other Ambulatory Visit: Payer: Self-pay | Admitting: *Deleted

## 2020-04-29 MED ORDER — PREDNISONE 1 MG PO TABS: 3.0000 mg | ORAL_TABLET | Freq: Every day | ORAL | 0 refills | Status: DC

## 2020-04-29 NOTE — Telephone Encounter (Signed)
Last visit: 04/22/2020 Next Visit: 09/02/2020  Current Dose per office note on 04/22/2020: He has been on prednisone 4 mg p.o. daily for 3 months now.  I advised him to try prednisone 3 mg p.o. daily again  Okay to refill prednisone?

## 2020-04-30 DIAGNOSIS — R531 Weakness: Secondary | ICD-10-CM | POA: Diagnosis not present

## 2020-04-30 DIAGNOSIS — M2569 Stiffness of other specified joint, not elsewhere classified: Secondary | ICD-10-CM | POA: Diagnosis not present

## 2020-04-30 DIAGNOSIS — R2689 Other abnormalities of gait and mobility: Secondary | ICD-10-CM | POA: Diagnosis not present

## 2020-04-30 DIAGNOSIS — M545 Low back pain: Secondary | ICD-10-CM | POA: Diagnosis not present

## 2020-05-05 DIAGNOSIS — R531 Weakness: Secondary | ICD-10-CM | POA: Diagnosis not present

## 2020-05-05 DIAGNOSIS — R2689 Other abnormalities of gait and mobility: Secondary | ICD-10-CM | POA: Diagnosis not present

## 2020-05-05 DIAGNOSIS — M2569 Stiffness of other specified joint, not elsewhere classified: Secondary | ICD-10-CM | POA: Diagnosis not present

## 2020-05-05 DIAGNOSIS — M545 Low back pain: Secondary | ICD-10-CM | POA: Diagnosis not present

## 2020-05-07 DIAGNOSIS — R531 Weakness: Secondary | ICD-10-CM | POA: Diagnosis not present

## 2020-05-07 DIAGNOSIS — R2689 Other abnormalities of gait and mobility: Secondary | ICD-10-CM | POA: Diagnosis not present

## 2020-05-07 DIAGNOSIS — M2569 Stiffness of other specified joint, not elsewhere classified: Secondary | ICD-10-CM | POA: Diagnosis not present

## 2020-05-07 DIAGNOSIS — M545 Low back pain: Secondary | ICD-10-CM | POA: Diagnosis not present

## 2020-05-12 DIAGNOSIS — R2689 Other abnormalities of gait and mobility: Secondary | ICD-10-CM | POA: Diagnosis not present

## 2020-05-12 DIAGNOSIS — M2569 Stiffness of other specified joint, not elsewhere classified: Secondary | ICD-10-CM | POA: Diagnosis not present

## 2020-05-12 DIAGNOSIS — R531 Weakness: Secondary | ICD-10-CM | POA: Diagnosis not present

## 2020-05-12 DIAGNOSIS — M545 Low back pain: Secondary | ICD-10-CM | POA: Diagnosis not present

## 2020-05-14 DIAGNOSIS — M545 Low back pain: Secondary | ICD-10-CM | POA: Diagnosis not present

## 2020-05-14 DIAGNOSIS — R2689 Other abnormalities of gait and mobility: Secondary | ICD-10-CM | POA: Diagnosis not present

## 2020-05-14 DIAGNOSIS — R531 Weakness: Secondary | ICD-10-CM | POA: Diagnosis not present

## 2020-05-14 DIAGNOSIS — M2569 Stiffness of other specified joint, not elsewhere classified: Secondary | ICD-10-CM | POA: Diagnosis not present

## 2020-05-19 DIAGNOSIS — R531 Weakness: Secondary | ICD-10-CM | POA: Diagnosis not present

## 2020-05-19 DIAGNOSIS — M2569 Stiffness of other specified joint, not elsewhere classified: Secondary | ICD-10-CM | POA: Diagnosis not present

## 2020-05-19 DIAGNOSIS — M545 Low back pain: Secondary | ICD-10-CM | POA: Diagnosis not present

## 2020-05-19 DIAGNOSIS — R2689 Other abnormalities of gait and mobility: Secondary | ICD-10-CM | POA: Diagnosis not present

## 2020-05-21 DIAGNOSIS — R531 Weakness: Secondary | ICD-10-CM | POA: Diagnosis not present

## 2020-05-21 DIAGNOSIS — R2689 Other abnormalities of gait and mobility: Secondary | ICD-10-CM | POA: Diagnosis not present

## 2020-05-21 DIAGNOSIS — M2569 Stiffness of other specified joint, not elsewhere classified: Secondary | ICD-10-CM | POA: Diagnosis not present

## 2020-05-21 DIAGNOSIS — M545 Low back pain: Secondary | ICD-10-CM | POA: Diagnosis not present

## 2020-05-29 ENCOUNTER — Telehealth: Payer: Self-pay | Admitting: Rheumatology

## 2020-05-29 MED ORDER — PREDNISONE 1 MG PO TABS
3.0000 mg | ORAL_TABLET | Freq: Every day | ORAL | 0 refills | Status: DC
Start: 2020-05-29 — End: 2020-06-29

## 2020-05-29 NOTE — Telephone Encounter (Signed)
Patient requesting a refill on Prednisone 3 mg / 1 month sent to [facility] on [address].

## 2020-05-29 NOTE — Telephone Encounter (Signed)
Last visit: 04/22/2020 Next Visit: 09/02/2020  Current Dose: Prednisone 3 mg po daily.  Okay to refill per Dr. Estanislado Pandy

## 2020-06-02 DIAGNOSIS — M2569 Stiffness of other specified joint, not elsewhere classified: Secondary | ICD-10-CM | POA: Diagnosis not present

## 2020-06-02 DIAGNOSIS — R2689 Other abnormalities of gait and mobility: Secondary | ICD-10-CM | POA: Diagnosis not present

## 2020-06-02 DIAGNOSIS — R531 Weakness: Secondary | ICD-10-CM | POA: Diagnosis not present

## 2020-06-02 DIAGNOSIS — M545 Low back pain: Secondary | ICD-10-CM | POA: Diagnosis not present

## 2020-06-03 ENCOUNTER — Other Ambulatory Visit: Payer: Self-pay | Admitting: Rheumatology

## 2020-06-03 NOTE — Telephone Encounter (Signed)
Last visit: 04/22/2020 Next Visit: 09/02/2020 Labs: 12/18/2019 Labs are stable.   Current Dose per office note on 04/22/2020: alendronate 70 mg p.o. weekly.  Okay to refill per Dr. Estanislado Pandy

## 2020-06-04 DIAGNOSIS — Z23 Encounter for immunization: Secondary | ICD-10-CM | POA: Diagnosis not present

## 2020-06-10 ENCOUNTER — Other Ambulatory Visit: Payer: Self-pay | Admitting: Family Medicine

## 2020-06-10 DIAGNOSIS — R058 Other specified cough: Secondary | ICD-10-CM

## 2020-06-23 ENCOUNTER — Other Ambulatory Visit: Payer: Self-pay | Admitting: Family Medicine

## 2020-06-29 ENCOUNTER — Other Ambulatory Visit: Payer: Self-pay | Admitting: Family Medicine

## 2020-06-29 ENCOUNTER — Other Ambulatory Visit: Payer: Self-pay | Admitting: *Deleted

## 2020-06-29 MED ORDER — PREDNISONE 1 MG PO TABS
2.0000 mg | ORAL_TABLET | Freq: Every day | ORAL | 0 refills | Status: DC
Start: 2020-06-29 — End: 2020-08-20

## 2020-06-29 NOTE — Telephone Encounter (Signed)
Patient called in regards to Prednisone. Patient is supposed to take 2mg  daily for 1 month and is in need of a new Rx reflecting that dosage.   Last Visit: 04/22/2020 Next Visit: 09/02/2020  Okay to fill Prednisone?

## 2020-07-22 ENCOUNTER — Other Ambulatory Visit: Payer: Self-pay | Admitting: *Deleted

## 2020-07-22 ENCOUNTER — Other Ambulatory Visit: Payer: Medicare Other

## 2020-07-22 MED ORDER — PREDNISONE 1 MG PO TABS: 2.5000 mg | ORAL_TABLET | Freq: Every day | ORAL | 0 refills | Status: DC

## 2020-07-22 NOTE — Telephone Encounter (Signed)
Patient contacted the office stating he needs a refill on Prednisone. Patient states he had been taking 2 mg daily but that was not working and increased himself to 2.5 mg daily. Patient states he will need a prescription which indicates that.   Last Visit: 04/22/2020 Next Visit: 09/02/2020  Okay to refill Prednisone for 2.5 mg?

## 2020-07-24 ENCOUNTER — Other Ambulatory Visit: Payer: Self-pay | Admitting: Family Medicine

## 2020-07-29 ENCOUNTER — Other Ambulatory Visit: Payer: Self-pay | Admitting: Family Medicine

## 2020-07-29 DIAGNOSIS — R058 Other specified cough: Secondary | ICD-10-CM

## 2020-08-20 ENCOUNTER — Other Ambulatory Visit: Payer: Self-pay

## 2020-08-20 MED ORDER — PREDNISONE 1 MG PO TABS: 3.0000 mg | ORAL_TABLET | Freq: Every day | ORAL | 0 refills | Status: DC

## 2020-08-20 NOTE — Telephone Encounter (Signed)
Patient called requesting to speak with you directly regarding his medication.
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
## 2020-08-20 NOTE — Telephone Encounter (Signed)
Patient states he is currently on 2.5 mg of Prednisone and would like to increase to 3 mg. Patient states he is having increased cramping in his hands and legs. Patient states he is also having increased fatigue. Please advise.

## 2020-09-02 ENCOUNTER — Other Ambulatory Visit: Payer: Self-pay

## 2020-09-02 ENCOUNTER — Ambulatory Visit (INDEPENDENT_AMBULATORY_CARE_PROVIDER_SITE_OTHER): Payer: Medicare Other | Admitting: Rheumatology

## 2020-09-02 ENCOUNTER — Other Ambulatory Visit: Payer: Self-pay | Admitting: *Deleted

## 2020-09-02 ENCOUNTER — Telehealth: Payer: Self-pay

## 2020-09-02 ENCOUNTER — Encounter: Payer: Self-pay | Admitting: Rheumatology

## 2020-09-02 VITALS — BP 121/78 | HR 87 | Ht 69.0 in | Wt 138.6 lb

## 2020-09-02 DIAGNOSIS — Z9889 Other specified postprocedural states: Secondary | ICD-10-CM | POA: Diagnosis not present

## 2020-09-02 DIAGNOSIS — D75839 Thrombocytosis, unspecified: Secondary | ICD-10-CM

## 2020-09-02 DIAGNOSIS — E559 Vitamin D deficiency, unspecified: Secondary | ICD-10-CM

## 2020-09-02 DIAGNOSIS — M19041 Primary osteoarthritis, right hand: Secondary | ICD-10-CM | POA: Diagnosis not present

## 2020-09-02 DIAGNOSIS — I712 Thoracic aortic aneurysm, without rupture, unspecified: Secondary | ICD-10-CM

## 2020-09-02 DIAGNOSIS — Z7952 Long term (current) use of systemic steroids: Secondary | ICD-10-CM

## 2020-09-02 DIAGNOSIS — R5383 Other fatigue: Secondary | ICD-10-CM | POA: Diagnosis not present

## 2020-09-02 DIAGNOSIS — M353 Polymyalgia rheumatica: Secondary | ICD-10-CM

## 2020-09-02 DIAGNOSIS — Z872 Personal history of diseases of the skin and subcutaneous tissue: Secondary | ICD-10-CM

## 2020-09-02 DIAGNOSIS — Z8719 Personal history of other diseases of the digestive system: Secondary | ICD-10-CM

## 2020-09-02 DIAGNOSIS — M19042 Primary osteoarthritis, left hand: Secondary | ICD-10-CM

## 2020-09-02 DIAGNOSIS — M810 Age-related osteoporosis without current pathological fracture: Secondary | ICD-10-CM | POA: Diagnosis not present

## 2020-09-02 DIAGNOSIS — M5030 Other cervical disc degeneration, unspecified cervical region: Secondary | ICD-10-CM

## 2020-09-02 DIAGNOSIS — I10 Essential (primary) hypertension: Secondary | ICD-10-CM

## 2020-09-02 DIAGNOSIS — Z79899 Other long term (current) drug therapy: Secondary | ICD-10-CM

## 2020-09-02 DIAGNOSIS — Z970 Presence of artificial eye: Secondary | ICD-10-CM

## 2020-09-02 DIAGNOSIS — R911 Solitary pulmonary nodule: Secondary | ICD-10-CM

## 2020-09-02 DIAGNOSIS — Z862 Personal history of diseases of the blood and blood-forming organs and certain disorders involving the immune mechanism: Secondary | ICD-10-CM

## 2020-09-02 MED ORDER — PREDNISONE 1 MG PO TABS
3.0000 mg | ORAL_TABLET | Freq: Every day | ORAL | 0 refills | Status: DC
Start: 2020-09-02 — End: 2020-12-08

## 2020-09-02 NOTE — Progress Notes (Signed)
Patient in office for a visit today. Patient requested a 90 day refill on Prednisone.  Last Visit: 09/02/2020 Next Visit: 12/04/2020  Current Dose: Prednisone 3 mg po daily  Okay to refill per Dr. Estanislado Pandy

## 2020-09-02 NOTE — Patient Instructions (Signed)
Denosumab injection What is this medicine? DENOSUMAB (den oh sue mab) slows bone breakdown. Prolia is used to treat osteoporosis in women after menopause and in men, and in people who are taking corticosteroids for 6 months or more. Xgeva is used to treat a high calcium level due to cancer and to prevent bone fractures and other bone problems caused by multiple myeloma or cancer bone metastases. Xgeva is also used to treat giant cell tumor of the bone. This medicine may be used for other purposes; ask your health care provider or pharmacist if you have questions. COMMON BRAND NAME(S): Prolia, XGEVA What should I tell my health care provider before I take this medicine? They need to know if you have any of these conditions:  dental disease  having surgery or tooth extraction  infection  kidney disease  low levels of calcium or Vitamin D in the blood  malnutrition  on hemodialysis  skin conditions or sensitivity  thyroid or parathyroid disease  an unusual reaction to denosumab, other medicines, foods, dyes, or preservatives  pregnant or trying to get pregnant  breast-feeding How should I use this medicine? This medicine is for injection under the skin. It is given by a health care professional in a hospital or clinic setting. A special MedGuide will be given to you before each treatment. Be sure to read this information carefully each time. For Prolia, talk to your pediatrician regarding the use of this medicine in children. Special care may be needed. For Xgeva, talk to your pediatrician regarding the use of this medicine in children. While this drug may be prescribed for children as young as 13 years for selected conditions, precautions do apply. Overdosage: If you think you have taken too much of this medicine contact a poison control center or emergency room at once. NOTE: This medicine is only for you. Do not share this medicine with others. What if I miss a dose? It is  important not to miss your dose. Call your doctor or health care professional if you are unable to keep an appointment. What may interact with this medicine? Do not take this medicine with any of the following medications:  other medicines containing denosumab This medicine may also interact with the following medications:  medicines that lower your chance of fighting infection  steroid medicines like prednisone or cortisone This list may not describe all possible interactions. Give your health care provider a list of all the medicines, herbs, non-prescription drugs, or dietary supplements you use. Also tell them if you smoke, drink alcohol, or use illegal drugs. Some items may interact with your medicine. What should I watch for while using this medicine? Visit your doctor or health care professional for regular checks on your progress. Your doctor or health care professional may order blood tests and other tests to see how you are doing. Call your doctor or health care professional for advice if you get a fever, chills or sore throat, or other symptoms of a cold or flu. Do not treat yourself. This drug may decrease your body's ability to fight infection. Try to avoid being around people who are sick. You should make sure you get enough calcium and vitamin D while you are taking this medicine, unless your doctor tells you not to. Discuss the foods you eat and the vitamins you take with your health care professional. See your dentist regularly. Brush and floss your teeth as directed. Before you have any dental work done, tell your dentist you are   receiving this medicine. Do not become pregnant while taking this medicine or for 5 months after stopping it. Talk with your doctor or health care professional about your birth control options while taking this medicine. Women should inform their doctor if they wish to become pregnant or think they might be pregnant. There is a potential for serious side  effects to an unborn child. Talk to your health care professional or pharmacist for more information. What side effects may I notice from receiving this medicine? Side effects that you should report to your doctor or health care professional as soon as possible:  allergic reactions like skin rash, itching or hives, swelling of the face, lips, or tongue  bone pain  breathing problems  dizziness  jaw pain, especially after dental work  redness, blistering, peeling of the skin  signs and symptoms of infection like fever or chills; cough; sore throat; pain or trouble passing urine  signs of low calcium like fast heartbeat, muscle cramps or muscle pain; pain, tingling, numbness in the hands or feet; seizures  unusual bleeding or bruising  unusually weak or tired Side effects that usually do not require medical attention (report to your doctor or health care professional if they continue or are bothersome):  constipation  diarrhea  headache  joint pain  loss of appetite  muscle pain  runny nose  tiredness  upset stomach This list may not describe all possible side effects. Call your doctor for medical advice about side effects. You may report side effects to FDA at 1-800-FDA-1088. Where should I keep my medicine? This medicine is only given in a clinic, doctor's office, or other health care setting and will not be stored at home. NOTE: This sheet is a summary. It may not cover all possible information. If you have questions about this medicine, talk to your doctor, pharmacist, or health care provider.  2020 Elsevier/Gold Standard (2018-01-26 16:10:44)

## 2020-09-02 NOTE — Telephone Encounter (Signed)
Please apply for prolia per Dr. Deveshwar. Thanks!  ?

## 2020-09-03 ENCOUNTER — Telehealth: Payer: Self-pay | Admitting: Pharmacist

## 2020-09-03 ENCOUNTER — Other Ambulatory Visit: Payer: Self-pay | Admitting: Rheumatology

## 2020-09-03 DIAGNOSIS — L570 Actinic keratosis: Secondary | ICD-10-CM | POA: Diagnosis not present

## 2020-09-03 DIAGNOSIS — L2081 Atopic neurodermatitis: Secondary | ICD-10-CM | POA: Diagnosis not present

## 2020-09-03 DIAGNOSIS — Z85828 Personal history of other malignant neoplasm of skin: Secondary | ICD-10-CM | POA: Diagnosis not present

## 2020-09-03 LAB — COMPLETE METABOLIC PANEL WITH GFR
AG Ratio: 1.6 (calc) (ref 1.0–2.5)
ALT: 17 U/L (ref 9–46)
AST: 16 U/L (ref 10–35)
Albumin: 3.9 g/dL (ref 3.6–5.1)
Alkaline phosphatase (APISO): 71 U/L (ref 35–144)
BUN: 19 mg/dL (ref 7–25)
CO2: 28 mmol/L (ref 20–32)
Calcium: 9.3 mg/dL (ref 8.6–10.3)
Chloride: 106 mmol/L (ref 98–110)
Creat: 1 mg/dL (ref 0.70–1.18)
GFR, Est African American: 84 mL/min/{1.73_m2} (ref >=60)
GFR, Est Non African American: 73 mL/min/{1.73_m2} (ref >=60)
Globulin: 2.4 g/dL (calc) (ref 1.9–3.7)
Glucose, Bld: 65 mg/dL (ref 65–139)
Potassium: 4.4 mmol/L (ref 3.5–5.3)
Sodium: 139 mmol/L (ref 135–146)
Total Bilirubin: 0.6 mg/dL (ref 0.2–1.2)
Total Protein: 6.3 g/dL (ref 6.1–8.1)

## 2020-09-03 LAB — CBC WITH DIFFERENTIAL/PLATELET
Absolute Monocytes: 780 cells/uL (ref 200–950)
Basophils Absolute: 70 cells/uL (ref 0–200)
Basophils Relative: 0.7 %
Eosinophils Absolute: 700 cells/uL — ABNORMAL HIGH (ref 15–500)
Eosinophils Relative: 7 %
HCT: 37.1 % — ABNORMAL LOW (ref 38.5–50.0)
Hemoglobin: 12.7 g/dL — ABNORMAL LOW (ref 13.2–17.1)
Lymphs Abs: 1280 cells/uL (ref 850–3900)
MCH: 32.9 pg (ref 27.0–33.0)
MCHC: 34.2 g/dL (ref 32.0–36.0)
MCV: 96.1 fL (ref 80.0–100.0)
MPV: 8.7 fL (ref 7.5–12.5)
Monocytes Relative: 7.8 %
Neutro Abs: 7170 cells/uL (ref 1500–7800)
Neutrophils Relative %: 71.7 %
Platelets: 381 10*3/uL (ref 140–400)
RBC: 3.86 10*6/uL — ABNORMAL LOW (ref 4.20–5.80)
RDW: 12.4 % (ref 11.0–15.0)
Total Lymphocyte: 12.8 %
WBC: 10 10*3/uL (ref 3.8–10.8)

## 2020-09-03 LAB — CARDIOLIPIN ANTIBODIES, IGG, IGM, IGA
Anticardiolipin IgA: 3.1 APL-U/mL
Anticardiolipin IgG: 3 GPL-U/mL
Anticardiolipin IgM: 27.5 MPL-U/mL — ABNORMAL HIGH

## 2020-09-03 LAB — TSH: TSH: 0.95 mIU/L (ref 0.40–4.50)

## 2020-09-03 LAB — VITAMIN D 25 HYDROXY (VIT D DEFICIENCY, FRACTURES): Vit D, 25-Hydroxy: 30 ng/mL (ref 30–100)

## 2020-09-03 LAB — TESTOSTERONE: Testosterone: 663 ng/dL (ref 250–827)

## 2020-09-03 LAB — PARATHYROID HORMONE, INTACT (NO CA): PTH: 38 pg/mL (ref 14–64)

## 2020-09-03 MED ORDER — DENOSUMAB 60 MG/ML ~~LOC~~ SOSY: 60.0000 mg | PREFILLED_SYRINGE | SUBCUTANEOUS | 0 refills | Status: DC

## 2020-09-03 NOTE — Progress Notes (Signed)
Anemia stable.  Anticardiolipin is low titer and not significant.  CMP is normal.  TSH is normal.  Testosterone is normal.  PTH is normal.  Vitamin D is 30 which is low normal.  I would recommend taking vitamin D 1000 units daily.

## 2020-09-03 NOTE — Telephone Encounter (Signed)
His labs are normal.  He should take vitamin D 1000 units daily.  Cardiolipin IgM is mildly elevated which was elevated before as well.  Still low titer and not significant.  We can schedule him to receive Prolia.

## 2020-09-03 NOTE — Telephone Encounter (Signed)
Prolia cover by insurance.

## 2020-09-03 NOTE — Telephone Encounter (Signed)
Ran test claim, Patient's plan does not require prior authorization. Patient's copay for 1 syringe is zero. Patient can fill through [facility].

## 2020-09-04 MED FILL — PROLIA 60 MG/ML SOLN: 60 | 180 days supply | Qty: 1 | Fill #0

## 2020-09-08 ENCOUNTER — Telehealth: Payer: Self-pay | Admitting: *Deleted

## 2020-09-08 NOTE — Telephone Encounter (Signed)
Received Prolia in office. Placed in [place]. Attempted to contact the patient and left message for patient to call the office to schedule.

## 2020-09-10 NOTE — Telephone Encounter (Signed)
Okay thank you!  I just transferred Brian Leon's appointment to Andrea's schedule on Monday, 09/14/20 at 10:00

## 2020-09-10 NOTE — Telephone Encounter (Signed)
Patient returned Andrea's call and is scheduled for Monday, 09/14/20 at 10:00 am on Devki's schedule.

## 2020-09-14 ENCOUNTER — Ambulatory Visit: Payer: Medicare Other | Admitting: Pharmacist

## 2020-09-14 ENCOUNTER — Ambulatory Visit: Payer: Medicare Other

## 2020-09-15 ENCOUNTER — Ambulatory Visit: Payer: Medicare Other

## 2020-09-16 ENCOUNTER — Ambulatory Visit (INDEPENDENT_AMBULATORY_CARE_PROVIDER_SITE_OTHER): Payer: Medicare Other | Admitting: *Deleted

## 2020-09-16 ENCOUNTER — Other Ambulatory Visit: Payer: Self-pay

## 2020-09-16 VITALS — BP 131/76 | HR 58

## 2020-09-16 DIAGNOSIS — M810 Age-related osteoporosis without current pathological fracture: Secondary | ICD-10-CM

## 2020-09-16 MED ORDER — DENOSUMAB 60 MG/ML ~~LOC~~ SOSY
60.0000 mg | PREFILLED_SYRINGE | Freq: Once | SUBCUTANEOUS | Status: AC
  Administered 2020-09-16: 60 mg via SUBCUTANEOUS

## 2020-09-16 NOTE — Progress Notes (Signed)
Pharmacy Note  Subjective:   Patient presents to clinic today to receive bi-annual dose of Prolia.  Patient running a fever or have signs/symptoms of infection? No  Patient currently on antibiotics for the treatment of infection? No  Patient had fall in the last 6 months?  No  If yes, did it require medical attention? No   Patient taking calcium 1200 mg daily through diet or supplement and at least 800 units vitamin D? Yes  Objective: CMP     Component Value Date/Time   NA 139 09/02/2020 1211   NA 144 08/05/2019 1340   K 4.4 09/02/2020 1211   CL 106 09/02/2020 1211   CO2 28 09/02/2020 1211   GLUCOSE 65 09/02/2020 1211   BUN 19 09/02/2020 1211   BUN 9 08/05/2019 1340   CREATININE 1.00 09/02/2020 1211   CALCIUM 9.3 09/02/2020 1211   PROT 6.3 09/02/2020 1211   PROT 6.0 08/05/2019 1340   ALBUMIN 3.9 08/05/2019 1340   AST 16 09/02/2020 1211   ALT 17 09/02/2020 1211   ALKPHOS 72 08/05/2019 1340   BILITOT 0.6 09/02/2020 1211   BILITOT 0.5 08/05/2019 1340   GFRNONAA 73 09/02/2020 1211   GFRAA 84 09/02/2020 1211    CBC    Component Value Date/Time   WBC 10.0 09/02/2020 1211   RBC 3.86 (L) 09/02/2020 1211   HGB 12.7 (L) 09/02/2020 1211   HGB 13.0 08/22/2018 1202   HCT 37.1 (L) 09/02/2020 1211   HCT 37.9 08/22/2018 1202   PLT 381 09/02/2020 1211   PLT 443 08/22/2018 1202   MCV 96.1 09/02/2020 1211   MCV 96 08/22/2018 1202   MCH 32.9 09/02/2020 1211   MCHC 34.2 09/02/2020 1211   RDW 12.4 09/02/2020 1211   RDW 12.1 (L) 08/22/2018 1202   LYMPHSABS 1,280 09/02/2020 1211   LYMPHSABS 2.4 08/22/2018 1202   MONOABS 944 03/01/2016 1214   EOSABS 700 (H) 09/02/2020 1211   EOSABS 0.6 (H) 08/22/2018 1202   BASOSABS 70 09/02/2020 1211   BASOSABS 0.1 08/22/2018 1202    Lab Results  Component Value Date   VD25OH 30 09/02/2020    T-score: -2.9 in the right femoral neck  Assessment/Plan:   Administrations This Visit    denosumab (PROLIA) injection 60 mg    Admin  Date 09/16/2020 Action Given Dose 60 mg Route Subcutaneous Administered By Carole Binning, LPN          Patient tolerated injection Well. Patient was monitored in office for 30 minutes after given for adverse reactions and none noted.   Patient is to return in 10-14 days for labs to monitor for hypocalcemia.  Future orders placed.   All questions encouraged and answered.  Instructed patient to call with any further questions or concerns.

## 2020-09-30 ENCOUNTER — Telehealth: Payer: Self-pay

## 2020-09-30 NOTE — Telephone Encounter (Signed)
Patient calls nurse line requesting penicillin for a "lung infection." Patient reports body aches, congestion, and productive cough. Patient denies fever or SOB at this time. Patient is in [place] currently. I advised patient to get covid tested with an OTC test kit. Patient will call back with results.

## 2020-10-08 ENCOUNTER — Other Ambulatory Visit: Payer: Self-pay | Admitting: Family Medicine

## 2020-10-08 DIAGNOSIS — R058 Other specified cough: Secondary | ICD-10-CM

## 2020-10-19 ENCOUNTER — Ambulatory Visit: Payer: Medicare Other | Admitting: Family Medicine

## 2020-10-20 ENCOUNTER — Other Ambulatory Visit: Payer: Medicare Other

## 2020-11-04 DIAGNOSIS — M5416 Radiculopathy, lumbar region: Secondary | ICD-10-CM | POA: Diagnosis not present

## 2020-11-04 DIAGNOSIS — M4126 Other idiopathic scoliosis, lumbar region: Secondary | ICD-10-CM | POA: Diagnosis not present

## 2020-11-04 DIAGNOSIS — I10 Essential (primary) hypertension: Secondary | ICD-10-CM | POA: Diagnosis not present

## 2020-11-04 DIAGNOSIS — M5126 Other intervertebral disc displacement, lumbar region: Secondary | ICD-10-CM | POA: Diagnosis not present
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
## 2020-11-26 ENCOUNTER — Other Ambulatory Visit: Payer: Self-pay | Admitting: Family Medicine

## 2020-11-26 DIAGNOSIS — R058 Other specified cough: Secondary | ICD-10-CM

## 2020-12-04 ENCOUNTER — Ambulatory Visit: Payer: Medicare Other | Admitting: Rheumatology

## 2020-12-04 DIAGNOSIS — M19042 Primary osteoarthritis, left hand: Secondary | ICD-10-CM

## 2020-12-04 DIAGNOSIS — Z872 Personal history of diseases of the skin and subcutaneous tissue: Secondary | ICD-10-CM

## 2020-12-04 DIAGNOSIS — I712 Thoracic aortic aneurysm, without rupture: Secondary | ICD-10-CM

## 2020-12-04 DIAGNOSIS — Z970 Presence of artificial eye: Secondary | ICD-10-CM

## 2020-12-04 DIAGNOSIS — R911 Solitary pulmonary nodule: Secondary | ICD-10-CM

## 2020-12-04 DIAGNOSIS — Z9889 Other specified postprocedural states: Secondary | ICD-10-CM

## 2020-12-04 DIAGNOSIS — D75839 Thrombocytosis, unspecified: Secondary | ICD-10-CM

## 2020-12-04 DIAGNOSIS — M810 Age-related osteoporosis without current pathological fracture: Secondary | ICD-10-CM

## 2020-12-04 DIAGNOSIS — Z7952 Long term (current) use of systemic steroids: Secondary | ICD-10-CM

## 2020-12-04 DIAGNOSIS — M5030 Other cervical disc degeneration, unspecified cervical region: Secondary | ICD-10-CM

## 2020-12-04 DIAGNOSIS — Z79899 Other long term (current) drug therapy: Secondary | ICD-10-CM

## 2020-12-04 DIAGNOSIS — E559 Vitamin D deficiency, unspecified: Secondary | ICD-10-CM

## 2020-12-04 DIAGNOSIS — M353 Polymyalgia rheumatica: Secondary | ICD-10-CM

## 2020-12-04 DIAGNOSIS — Z8719 Personal history of other diseases of the digestive system: Secondary | ICD-10-CM

## 2020-12-04 DIAGNOSIS — R5383 Other fatigue: Secondary | ICD-10-CM

## 2020-12-04 DIAGNOSIS — I10 Essential (primary) hypertension: Secondary | ICD-10-CM

## 2020-12-04 DIAGNOSIS — Z862 Personal history of diseases of the blood and blood-forming organs and certain disorders involving the immune mechanism: Secondary | ICD-10-CM
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
## 2020-12-08 ENCOUNTER — Encounter: Payer: Self-pay | Admitting: Rheumatology

## 2020-12-08 ENCOUNTER — Other Ambulatory Visit: Payer: Self-pay | Admitting: Rheumatology

## 2020-12-08 ENCOUNTER — Ambulatory Visit (INDEPENDENT_AMBULATORY_CARE_PROVIDER_SITE_OTHER): Payer: Medicare Other | Admitting: Rheumatology

## 2020-12-08 ENCOUNTER — Telehealth: Payer: Self-pay

## 2020-12-08 ENCOUNTER — Other Ambulatory Visit: Payer: Self-pay

## 2020-12-08 VITALS — BP 125/74 | HR 76 | Resp 14 | Ht 69.5 in | Wt 145.8 lb

## 2020-12-08 DIAGNOSIS — Z7952 Long term (current) use of systemic steroids: Secondary | ICD-10-CM

## 2020-12-08 DIAGNOSIS — R911 Solitary pulmonary nodule: Secondary | ICD-10-CM | POA: Diagnosis not present

## 2020-12-08 DIAGNOSIS — R5383 Other fatigue: Secondary | ICD-10-CM

## 2020-12-08 DIAGNOSIS — M5030 Other cervical disc degeneration, unspecified cervical region: Secondary | ICD-10-CM

## 2020-12-08 DIAGNOSIS — M353 Polymyalgia rheumatica: Secondary | ICD-10-CM | POA: Diagnosis not present

## 2020-12-08 DIAGNOSIS — M19041 Primary osteoarthritis, right hand: Secondary | ICD-10-CM | POA: Diagnosis not present

## 2020-12-08 DIAGNOSIS — I712 Thoracic aortic aneurysm, without rupture, unspecified: Secondary | ICD-10-CM

## 2020-12-08 DIAGNOSIS — D75839 Thrombocytosis, unspecified: Secondary | ICD-10-CM | POA: Diagnosis not present

## 2020-12-08 DIAGNOSIS — E559 Vitamin D deficiency, unspecified: Secondary | ICD-10-CM

## 2020-12-08 DIAGNOSIS — I10 Essential (primary) hypertension: Secondary | ICD-10-CM

## 2020-12-08 DIAGNOSIS — Z862 Personal history of diseases of the blood and blood-forming organs and certain disorders involving the immune mechanism: Secondary | ICD-10-CM

## 2020-12-08 DIAGNOSIS — Z79899 Other long term (current) drug therapy: Secondary | ICD-10-CM | POA: Diagnosis not present

## 2020-12-08 DIAGNOSIS — Z872 Personal history of diseases of the skin and subcutaneous tissue: Secondary | ICD-10-CM

## 2020-12-08 DIAGNOSIS — Z970 Presence of artificial eye: Secondary | ICD-10-CM

## 2020-12-08 DIAGNOSIS — Z8719 Personal history of other diseases of the digestive system: Secondary | ICD-10-CM

## 2020-12-08 DIAGNOSIS — Z9889 Other specified postprocedural states: Secondary | ICD-10-CM

## 2020-12-08 DIAGNOSIS — M19042 Primary osteoarthritis, left hand: Secondary | ICD-10-CM

## 2020-12-08 DIAGNOSIS — M810 Age-related osteoporosis without current pathological fracture: Secondary | ICD-10-CM

## 2020-12-08 NOTE — Telephone Encounter (Signed)
Noted in the chart. Thanks!

## 2020-12-08 NOTE — Telephone Encounter (Signed)
Patient states his first Moderna vaccine was 10/25/19, 2nd vaccine 11/22/19, and booster 09/03/20.

## 2020-12-08 NOTE — Telephone Encounter (Signed)
Last Visit: 09/02/2020 Next Visit: 12/08/2020  Current Dose per office note on 09/02/2020, prednisone 3mg  po qd.  Dx: Polymyalgia rheumatica   Last Fill:09/02/2020  Okay to refill prednisone?

## 2020-12-29 ENCOUNTER — Other Ambulatory Visit (HOSPITAL_COMMUNITY): Payer: Self-pay

## 2020-12-31 DIAGNOSIS — H00024 Hordeolum internum left upper eyelid: Secondary | ICD-10-CM | POA: Diagnosis not present

## 2021-01-04 ENCOUNTER — Ambulatory Visit: Payer: Medicare Other

## 2021-01-06 DIAGNOSIS — H00024 Hordeolum internum left upper eyelid: Secondary | ICD-10-CM | POA: Diagnosis not present

## 2021-01-11 ENCOUNTER — Other Ambulatory Visit: Payer: Medicare Other

## 2021-01-14 DIAGNOSIS — Z23 Encounter for immunization: Secondary | ICD-10-CM | POA: Diagnosis not present

## 2021-01-27 ENCOUNTER — Other Ambulatory Visit: Payer: Self-pay | Admitting: Family Medicine

## 2021-01-27 DIAGNOSIS — R058 Other specified cough: Secondary | ICD-10-CM

## 2021-02-02 ENCOUNTER — Other Ambulatory Visit: Payer: Self-pay

## 2021-02-02 ENCOUNTER — Ambulatory Visit (INDEPENDENT_AMBULATORY_CARE_PROVIDER_SITE_OTHER): Payer: Medicare Other | Admitting: Family Medicine

## 2021-02-02 ENCOUNTER — Encounter: Payer: Self-pay | Admitting: Family Medicine

## 2021-02-02 ENCOUNTER — Ambulatory Visit (HOSPITAL_COMMUNITY)
Admission: RE | Admit: 2021-02-02 | Discharge: 2021-02-02 | Disposition: A | Discharge status: Home / Self Care | Payer: Medicare Other | Source: Ambulatory Visit | Attending: Family Medicine | Admitting: Family Medicine

## 2021-02-02 VITALS — BP 132/82 | HR 96 | Wt 144.2 lb

## 2021-02-02 DIAGNOSIS — Z1211 Encounter for screening for malignant neoplasm of colon: Secondary | ICD-10-CM

## 2021-02-02 DIAGNOSIS — R42 Dizziness and giddiness: Secondary | ICD-10-CM

## 2021-02-02 DIAGNOSIS — I712 Thoracic aortic aneurysm, without rupture, unspecified: Secondary | ICD-10-CM

## 2021-02-02 DIAGNOSIS — J4520 Mild intermittent asthma, uncomplicated: Secondary | ICD-10-CM

## 2021-02-02 NOTE — Patient Instructions (Signed)
It was wonderful to see you today.  Please bring ALL of your medications with you to every visit.   Today we talked about:  Dizziness EKG was we are referring you to cardiology.  We are also obtaining basic blood work will call with abnormal results otherwise I will send a letter with normal results.  We will obtain a CT image to follow-up on your thoracic aortic aneurysm.  In the meantime please rise from a seated position slowly and consider having someone else drive for you.  We also discussed concern of emphysema It appears stable currently.  We will follow-up on this in a month.  Continue your inhalers.  I have referred you to gastroenterology for your colonoscopy.  They will give you a call to schedule this.  Please be sure to schedule follow up at the front  desk before you leave today.   Please call the clinic at [phone] if your symptoms worsen or you have any concerns. It was our pleasure to serve you.  Dr. Janus Molder

## 2021-02-02 NOTE — Progress Notes (Signed)
    SUBJECTIVE:   CHIEF COMPLAINT / HPI:   Brian Leon is a 75 yo M who presents for the issues below.   Dizziness Episode occurred 3 weeks ago on 2 separate occasions that last 5-10 mins. Has not reoccurred. First time he passed out and hit the bathroom floor. Second episode he was smoking marijuana but did not pass out and was able to sit on the couch until episode passed. This has never happened before. During these episode he does not experience SOB, chest pain, palpitation or tightness. No recent changes in medicine. Patient denies dehydration or lack of nutrition.   Asthma Concerned about emphysema. Episode of shortness of breath several weeks ago. No issue with breathing at this time. Wants to know if he is on the right medication for this. Admits to continue marijuana use as above.   Missed colonoscopy appointment/HM Referred to GI but missed appointment. Would like to be reconnected to complete this.   PERTINENT  PMH / PSH: Thoracic aortic aneurysm    OBJECTIVE:   BP 132/82   Pulse 96   Wt 144 lb 3.2 oz (65.4 kg)   SpO2 99%   BMI 20.99 kg/m   No data found. General: Appears well, no acute distress. Age appropriate. Cardiac: RRR, normal heart sounds, no murmurs Respiratory: Prolonged expiratory wheeze at left lung base otherwise CTA, normal effort Neuro: alert and oriented, no focal deficits. Normal gait.  Psych: normal affect EKG: normal EKG, normal sinus rhythm, unchanged from previous tracings, RBBB.  CT 08/09/2019 IMPRESSION: 1. Stable aneurysmal dilatation of the ascending thoracic aorta measuring 4.2 cm. Recommend annual imaging followup by CTA or MRA. This recommendation follows 2010 ACCF/AHA/AATS/ACR/ASA/SCA/SCAI/SIR/STS/SVM Guidelines for the Diagnosis and Management of Patients with Thoracic Aortic Disease. Circulation. 2010; 121: B147-W295. Aortic aneurysm NOS (ICD10-I71.9).  2. Few tiny right-sided pulmonary nodules as described with the largest over  the right lower lobe measuring approximately 3 mm. Slight decrease in size of a 3 mm nodule over the medial right lower lobe. Recommend follow-up CT 1 year. This recommendation follows the consensus statement: Guidelines for Management of Small Pulmonary Nodules Detected on CT Scans: A Statement from the [place] as published in Radiology 2005; 237:395-400. Online at: https://www.arnold.com/.  3. Aortic Atherosclerosis (ICD10-I70.0) and Emphysema (ICD10-J43.9). Mild atherosclerotic coronary artery disease.  4. Chronic stable atelectasis medial left lower lobe. Minimal aspirate material over the distal trachea.  ASSESSMENT/PLAN:   Dizziness New onset 3 weeks prior with a total of 2 episodes. 1 resulting in passing out but patient states he was aware he was going out. I am concern about a cardiac issue. EKG today unchanged from last and without acute arrhythmias. Will refer to cardiology. Also consider other causes such as orthostatic hypotension as patient takes finastride. Orthostatic vital signs negative. Other cause consider would be vascular, neurological, or electrolyte imbalance. Patient with thoracic aortic aneurysm needed follow up with CT imaging in 2021 which was not obtained. Obtain CT. Will obtain basic lab work and follow up.  -Referral to cardiology -CT for TAA -BMP, CBC -Follow up in 1 month or sooner if needed  Thoracic aortic aneurysm ([place]) -CT scan as above.  Asthma, chronic Repeat CT as above.  -Continue maintenance inhaler -Follow up in 1 month or sooner if desired  Screening for colon cancer -Referral placed previously; number to call to schedule colonoscopy given to patient   Brian Leon, [name]

## 2021-02-03 DIAGNOSIS — R42 Dizziness and giddiness: Secondary | ICD-10-CM | POA: Insufficient documentation

## 2021-02-03 LAB — CBC
Hematocrit: 35.4 % — ABNORMAL LOW (ref 37.5–51.0)
Hemoglobin: 12.1 g/dL — ABNORMAL LOW (ref 13.0–17.7)
MCH: 32.6 pg (ref 26.6–33.0)
MCHC: 34.2 g/dL (ref 31.5–35.7)
MCV: 95 fL (ref 79–97)
Platelets: 364 10*3/uL (ref 150–450)
RBC: 3.71 x10E6/uL — ABNORMAL LOW (ref 4.14–5.80)
RDW: 12.4 % (ref 11.6–15.4)
WBC: 10.6 10*3/uL (ref 3.4–10.8)

## 2021-02-03 LAB — BASIC METABOLIC PANEL
BUN/Creatinine Ratio: 19 (ref 10–24)
BUN: 16 mg/dL (ref 8–27)
CO2: 22 mmol/L (ref 20–29)
Calcium: 9 mg/dL (ref 8.6–10.2)
Chloride: 103 mmol/L (ref 96–106)
Creatinine, Ser: 0.85 mg/dL (ref 0.76–1.27)
Glucose: 82 mg/dL (ref 65–99)
Potassium: 4.2 mmol/L (ref 3.5–5.2)
Sodium: 137 mmol/L (ref 134–144)
eGFR: 89 mL/min/{1.73_m2} (ref >59)

## 2021-02-03 NOTE — Assessment & Plan Note (Signed)
New onset 3 weeks prior with a total of 2 episodes. 1 resulting in passing out but patient states he was aware he was going out. I am concern about a cardiac issue. EKG today unchanged from last and without acute arrhythmias. Will refer to cardiology. Also consider other causes such as orthostatic hypotension as patient takes finastride. Orthostatic vital signs negative. Other cause consider would be vascular, neurological, or electrolyte imbalance. Patient with thoracic aortic aneurysm needed follow up with CT imaging in 2021 which was not obtained. Obtain CT. Will obtain basic lab work and follow up.  -Referral to cardiology -CT for TAA -BMP, CBC -Follow up in 1 month or sooner if needed

## 2021-02-03 NOTE — Assessment & Plan Note (Addendum)
Repeat CT as above.  -Continue maintenance inhaler -Follow up in 1 month or sooner if desired

## 2021-02-03 NOTE — Assessment & Plan Note (Signed)
-  CT scan as above.

## 2021-02-03 NOTE — Assessment & Plan Note (Signed)
-  Referral placed previously; number to call to schedule colonoscopy given to patient

## 2021-02-04 ENCOUNTER — Ambulatory Visit
Admission: RE | Admit: 2021-02-04 | Discharge: 2021-02-04 | Disposition: A | Discharge status: Home / Self Care | Payer: Medicare Other | Source: Ambulatory Visit | Attending: Family Medicine | Admitting: Family Medicine

## 2021-02-04 ENCOUNTER — Telehealth: Payer: Self-pay | Admitting: Family Medicine

## 2021-02-04 ENCOUNTER — Telehealth: Payer: Self-pay | Admitting: Cardiology

## 2021-02-04 ENCOUNTER — Other Ambulatory Visit: Payer: Self-pay

## 2021-02-04 DIAGNOSIS — M810 Age-related osteoporosis without current pathological fracture: Secondary | ICD-10-CM | POA: Diagnosis not present

## 2021-02-04 DIAGNOSIS — M8589 Other specified disorders of bone density and structure, multiple sites: Secondary | ICD-10-CM | POA: Diagnosis not present

## 2021-02-04 NOTE — Telephone Encounter (Signed)
Did not need this encounter °

## 2021-02-04 NOTE — Telephone Encounter (Signed)
Discussed anemia results with patient.  Continues to be low.  Previously discussed going to see gastroenterologist for scope.  Patient voiced that he has a preop appointment scheduled.  We will follow-up in 4 to 6 weeks.  [place], DO 02/04/2021, 8:41 AM PGY-2, [place]

## 2021-02-08 ENCOUNTER — Ambulatory Visit (HOSPITAL_COMMUNITY): Admission: RE | Admit: 2021-02-08 | Payer: Medicare Other | Source: Ambulatory Visit

## 2021-02-08 NOTE — Addendum Note (Signed)
Addended by: Caralee Ates on: 02/08/2021 09:53 AM   Modules accepted: Orders

## 2021-02-12 ENCOUNTER — Encounter: Payer: Self-pay | Admitting: Family Medicine

## 2021-02-16 DIAGNOSIS — Z86010 Personal history of colonic polyps: Secondary | ICD-10-CM | POA: Diagnosis not present

## 2021-02-16 DIAGNOSIS — K5900 Constipation, unspecified: Secondary | ICD-10-CM | POA: Diagnosis not present

## 2021-02-18 ENCOUNTER — Telehealth: Payer: Self-pay | Admitting: Pharmacist

## 2021-02-18 ENCOUNTER — Other Ambulatory Visit (HOSPITAL_COMMUNITY): Payer: Self-pay

## 2021-02-18 DIAGNOSIS — M810 Age-related osteoporosis without current pathological fracture: Secondary | ICD-10-CM

## 2021-02-18 DIAGNOSIS — Z79899 Other long term (current) drug therapy: Secondary | ICD-10-CM

## 2021-02-18 DIAGNOSIS — E559 Vitamin D deficiency, unspecified: Secondary | ICD-10-CM

## 2021-02-18 NOTE — Telephone Encounter (Signed)
Ran test claim, Patient's plan does not require prior authorization for Prolia. Patient's copay for 1 syringe is zero. Patient can fill through [facility].

## 2021-02-18 NOTE — Telephone Encounter (Signed)
Patient's Prolia is due on 03/15/21.  Receives through pharmacy benefit. Can we ensure Brian Leon is in place or if it needs to be renewed?  Labs not yet drawn. OV with Dr. Estanislado Pandy is 04/01/21 and can receive at that clinic visit. Will tell him to have labs drawn 2 weeks beforehand  [name], PharmD, MPH Clinical Pharmacist (Rheumatology and Pulmonology)

## 2021-02-19 ENCOUNTER — Other Ambulatory Visit: Payer: Self-pay

## 2021-02-19 ENCOUNTER — Ambulatory Visit (HOSPITAL_COMMUNITY)
Admission: RE | Admit: 2021-02-19 | Discharge: 2021-02-19 | Disposition: A | Discharge status: Home / Self Care | Payer: Medicare Other | Source: Ambulatory Visit | Attending: Family Medicine | Admitting: Family Medicine

## 2021-02-19 DIAGNOSIS — R911 Solitary pulmonary nodule: Secondary | ICD-10-CM | POA: Diagnosis not present

## 2021-02-19 DIAGNOSIS — I712 Thoracic aortic aneurysm, without rupture, unspecified: Secondary | ICD-10-CM

## 2021-02-19 DIAGNOSIS — J9811 Atelectasis: Secondary | ICD-10-CM | POA: Diagnosis not present

## 2021-02-19 DIAGNOSIS — I517 Cardiomegaly: Secondary | ICD-10-CM | POA: Diagnosis not present

## 2021-02-19 MED ORDER — SODIUM CHLORIDE (PF) 0.9 % IJ SOLN
INTRAMUSCULAR | Status: AC
  Filled 2021-02-19: qty 50

## 2021-02-19 MED ORDER — IOHEXOL 350 MG/ML SOLN
75.0000 mL | Freq: Once | INTRAVENOUS | Status: AC | PRN
  Administered 2021-02-19: 75 mL via INTRAVENOUS

## 2021-02-20 ENCOUNTER — Other Ambulatory Visit: Payer: Self-pay | Admitting: Family Medicine

## 2021-02-22 ENCOUNTER — Telehealth: Payer: Self-pay | Admitting: Family Medicine

## 2021-02-22 NOTE — Telephone Encounter (Signed)
LVM to call back for CT results. Grossly unchanged from last imaging. Stable aneurysm.   Gerlene Fee, DO 02/22/2021, 12:11 PM PGY-2, [place]

## 2021-02-22 NOTE — Telephone Encounter (Signed)
Patient returns call to nurse line. Informed of below. Patient has no further questions at this time.   Talbot Grumbling, RN

## 2021-02-25 DIAGNOSIS — Z87442 Personal history of urinary calculi: Secondary | ICD-10-CM | POA: Insufficient documentation

## 2021-02-25 DIAGNOSIS — J189 Pneumonia, unspecified organism: Secondary | ICD-10-CM | POA: Insufficient documentation

## 2021-02-25 DIAGNOSIS — T8859XA Other complications of anesthesia, initial encounter: Secondary | ICD-10-CM | POA: Insufficient documentation

## 2021-02-25 DIAGNOSIS — J309 Allergic rhinitis, unspecified: Secondary | ICD-10-CM | POA: Insufficient documentation

## 2021-02-25 DIAGNOSIS — L309 Dermatitis, unspecified: Secondary | ICD-10-CM | POA: Insufficient documentation

## 2021-02-25 DIAGNOSIS — N281 Cyst of kidney, acquired: Secondary | ICD-10-CM | POA: Insufficient documentation

## 2021-02-25 DIAGNOSIS — J302 Other seasonal allergic rhinitis: Secondary | ICD-10-CM | POA: Insufficient documentation

## 2021-02-25 DIAGNOSIS — C649 Malignant neoplasm of unspecified kidney, except renal pelvis: Secondary | ICD-10-CM | POA: Insufficient documentation

## 2021-02-25 DIAGNOSIS — H5440 Blindness, one eye, unspecified eye: Secondary | ICD-10-CM | POA: Insufficient documentation

## 2021-02-25 DIAGNOSIS — Z87448 Personal history of other diseases of urinary system: Secondary | ICD-10-CM | POA: Insufficient documentation

## 2021-02-25 DIAGNOSIS — Q646 Congenital diverticulum of bladder: Secondary | ICD-10-CM | POA: Insufficient documentation

## 2021-02-25 DIAGNOSIS — R519 Headache, unspecified: Secondary | ICD-10-CM | POA: Insufficient documentation

## 2021-02-25 DIAGNOSIS — Z8489 Family history of other specified conditions: Secondary | ICD-10-CM | POA: Insufficient documentation

## 2021-02-25 DIAGNOSIS — K759 Inflammatory liver disease, unspecified: Secondary | ICD-10-CM | POA: Insufficient documentation

## 2021-02-25 DIAGNOSIS — Z8719 Personal history of other diseases of the digestive system: Secondary | ICD-10-CM | POA: Insufficient documentation

## 2021-02-25 DIAGNOSIS — N201 Calculus of ureter: Secondary | ICD-10-CM | POA: Insufficient documentation

## 2021-02-26 ENCOUNTER — Ambulatory Visit (INDEPENDENT_AMBULATORY_CARE_PROVIDER_SITE_OTHER): Payer: Medicare Other

## 2021-02-26 ENCOUNTER — Other Ambulatory Visit: Payer: Self-pay

## 2021-02-26 ENCOUNTER — Ambulatory Visit (INDEPENDENT_AMBULATORY_CARE_PROVIDER_SITE_OTHER): Payer: Medicare Other | Admitting: Cardiology

## 2021-02-26 ENCOUNTER — Encounter: Payer: Self-pay | Admitting: Cardiology

## 2021-02-26 VITALS — BP 124/74 | HR 82 | Ht 69.0 in | Wt 139.0 lb

## 2021-02-26 DIAGNOSIS — I10 Essential (primary) hypertension: Secondary | ICD-10-CM | POA: Diagnosis not present

## 2021-02-26 DIAGNOSIS — R42 Dizziness and giddiness: Secondary | ICD-10-CM | POA: Diagnosis not present

## 2021-02-26 DIAGNOSIS — I712 Thoracic aortic aneurysm, without rupture, unspecified: Secondary | ICD-10-CM

## 2021-02-26 DIAGNOSIS — E785 Hyperlipidemia, unspecified: Secondary | ICD-10-CM | POA: Diagnosis not present

## 2021-02-26 DIAGNOSIS — R55 Syncope and collapse: Secondary | ICD-10-CM | POA: Diagnosis not present

## 2021-02-26 DIAGNOSIS — C649 Malignant neoplasm of unspecified kidney, except renal pelvis: Secondary | ICD-10-CM | POA: Diagnosis not present

## 2021-02-26 DIAGNOSIS — Z13220 Encounter for screening for lipoid disorders: Secondary | ICD-10-CM

## 2021-02-26 NOTE — Patient Instructions (Signed)
Medication Instructions:  Your physician recommends that you continue on your current medications as directed. Please refer to the Current Medication list given to you today.  *If you need a refill on your cardiac medications before your next appointment, please call your pharmacy*   Lab Work: None If you have labs (blood work) drawn today and your tests are completely normal, you will receive your results only by: Marland Kitchen MyChart Message (if you have MyChart) OR . A paper copy in the mail If you have any lab test that is abnormal or we need to change your treatment, we will call you to review the results.   Testing/Procedures: A zio monitor was ordered today. It will remain on for 14 days. You will then return monitor and event diary in provided box. It takes 1-2 weeks for report to be downloaded and returned to [place]. We will call you with the results. If monitor falls off or has orange flashing light, please call Zio for further instructions.   Your physician has requested that you have an echocardiogram. Echocardiography is a painless test that uses sound waves to create images of your heart. It provides your doctor with information about the size and shape of your heart and how well your heart's chambers and valves are working. This procedure takes approximately one hour. There are no restrictions for this procedure.     Follow-Up: At [facility], you and your health needs are our priority.  As part of our continuing mission to provide you with exceptional heart care, we have created designated Provider Care Teams.  These Care Teams include your primary Cardiologist (physician) and Advanced Practice Providers (APPs -  Physician Assistants and Nurse Practitioners) who all work together to provide you with the care you need, when you need it.  We recommend signing up for the patient portal called "MyChart".  Sign up information is provided on this After Visit Summary.  MyChart is used to  connect with patients for Virtual Visits (Telemedicine).  Patients are able to view lab/test results, encounter notes, upcoming appointments, etc.  Non-urgent messages can be sent to your provider as well.   To learn more about what you can do with MyChart, go to [email or web address].    Your next appointment:   2 month(s)  The format for your next appointment:   In Person  Provider:   Jenne Campus, MD   Other Instructions   Echocardiogram An echocardiogram is a test that uses sound waves (ultrasound) to produce images of the heart. Images from an echocardiogram can provide important information about:  Heart size and shape.  The size and thickness and movement of your heart's walls.  Heart muscle function and strength.  Heart valve function or if you have stenosis. Stenosis is when the heart valves are too narrow.  If blood is flowing backward through the heart valves (regurgitation).  A tumor or infectious growth around the heart valves.  Areas of heart muscle that are not working well because of poor blood flow or injury from a heart attack.  Aneurysm detection. An aneurysm is a weak or damaged part of an artery wall. The wall bulges out from the normal force of blood pumping through the body. Tell a health care provider about:  Any allergies you have.  All medicines you are taking, including vitamins, herbs, eye drops, creams, and over-the-counter medicines.  Any blood disorders you have.  Any surgeries you have had.  Any medical conditions you have.  Whether you  are pregnant or may be pregnant. What are the risks? Generally, this is a safe test. However, problems may occur, including an allergic reaction to dye (contrast) that may be used during the test. What happens before the test? No specific preparation is needed. You may eat and drink normally. What happens during the test?  You will take off your clothes from the waist up and put on a hospital  gown.  Electrodes or electrocardiogram (ECG)patches may be placed on your chest. The electrodes or patches are then connected to a device that monitors your heart rate and rhythm.  You will lie down on a table for an ultrasound exam. A gel will be applied to your chest to help sound waves pass through your skin.  A handheld device, called a transducer, will be pressed against your chest and moved over your heart. The transducer produces sound waves that travel to your heart and bounce back (or "echo" back) to the transducer. These sound waves will be captured in real-time and changed into images of your heart that can be viewed on a video monitor. The images will be recorded on a computer and reviewed by your health care provider.  You may be asked to change positions or hold your breath for a short time. This makes it easier to get different views or better views of your heart.  In some cases, you may receive contrast through an IV in one of your veins. This can improve the quality of the pictures from your heart. The procedure may vary among health care providers and hospitals.   What can I expect after the test? You may return to your normal, everyday life, including diet, activities, and medicines, unless your health care provider tells you not to do that. Follow these instructions at home:  It is up to you to get the results of your test. Ask your health care provider, or the department that is doing the test, when your results will be ready.  Keep all follow-up visits. This is important. Summary  An echocardiogram is a test that uses sound waves (ultrasound) to produce images of the heart.  Images from an echocardiogram can provide important information about the size and shape of your heart, heart muscle function, heart valve function, and other possible heart problems.  You do not need to do anything to prepare before this test. You may eat and drink normally.  After the  echocardiogram is completed, you may return to your normal, everyday life, unless your health care provider tells you not to do that. This information is not intended to replace advice given to you by your health care provider. Make sure you discuss any questions you have with your health care provider. Document Revised: 05/12/2020 Document Reviewed: 05/12/2020 Elsevier Patient Education  2021 [employer].
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
## 2021-02-27 LAB — LIPID PANEL
Chol/HDL Ratio: 3.3 ratio (ref 0.0–5.0)
Cholesterol, Total: 183 mg/dL (ref 100–199)
HDL: 55 mg/dL (ref >39)
LDL Chol Calc (NIH): 113 mg/dL — ABNORMAL HIGH (ref 0–99)
Triglycerides: 79 mg/dL (ref 0–149)
VLDL Cholesterol Cal: 15 mg/dL (ref 5–40)

## 2021-03-03 NOTE — Telephone Encounter (Addendum)
Left VM for patient regarding Prolia labs. Placed future lab orders for CBC, CMP, and Vitamin D. He is due 03/15/21 for next Prolia injection which he can receive at the clinic.  [name], PharmD, MPH Clinical Pharmacist (Rheumatology and Pulmonology)

## 2021-03-04 ENCOUNTER — Telehealth: Payer: Self-pay

## 2021-03-04 ENCOUNTER — Telehealth: Payer: Self-pay | Admitting: Cardiology

## 2021-03-04 NOTE — Telephone Encounter (Signed)
Patient returning call for lab results. 

## 2021-03-04 NOTE — Telephone Encounter (Signed)
Patient informed of results.  

## 2021-03-04 NOTE — Telephone Encounter (Signed)
Patient called stating he is due to have labwork next week for his Prolia injection, but is in [place] for the next 10 days.  Patient states he will have labwork when he returns to [facility].

## 2021-03-04 NOTE — Telephone Encounter (Addendum)
Patient returned call stating he is out of town. Will have labwork completed for Prolia when he is back in town.  Lab orders remain in place.  [name], PharmD, MPH Clinical Pharmacist (Rheumatology and Pulmonology)

## 2021-03-10 ENCOUNTER — Ambulatory Visit: Payer: Medicare Other | Admitting: Rheumatology

## 2021-03-12 DIAGNOSIS — R55 Syncope and collapse: Secondary | ICD-10-CM

## 2021-03-13 ENCOUNTER — Other Ambulatory Visit: Payer: Self-pay | Admitting: Physician Assistant

## 2021-03-15 ENCOUNTER — Other Ambulatory Visit (HOSPITAL_COMMUNITY): Payer: Self-pay

## 2021-03-15 ENCOUNTER — Ambulatory Visit: Payer: Medicare Other | Admitting: Family Medicine

## 2021-03-15 NOTE — Progress Notes (Deleted)
    SUBJECTIVE:   CHIEF COMPLAINT / HPI:   Follow up  PERTINENT  PMH / PSH:   OBJECTIVE:   There were no vitals taken for this visit.  ***  ASSESSMENT/PLAN:   No problem-specific Assessment & Plan notes found for this encounter.     Gerlene Fee, Ruth   {    This will disappear when note is signed, click to select method of visit    :1}

## 2021-03-15 NOTE — Telephone Encounter (Signed)
Last Visit: 12/08/2020  Next Visit: 04/01/2021  Current Dose per office note on 12/08/2020: prednisone 3 mg p.o. daily  Okay to refill per Dr. Estanislado Pandy

## 2021-03-17 ENCOUNTER — Other Ambulatory Visit: Payer: Self-pay | Admitting: *Deleted

## 2021-03-17 ENCOUNTER — Telehealth: Payer: Self-pay | Admitting: Rheumatology

## 2021-03-17 DIAGNOSIS — Z79899 Other long term (current) drug therapy: Secondary | ICD-10-CM

## 2021-03-17 DIAGNOSIS — E559 Vitamin D deficiency, unspecified: Secondary | ICD-10-CM | POA: Diagnosis not present

## 2021-03-17 NOTE — Telephone Encounter (Signed)
Patient came in this am for lab draw. He wanted to make sure someone would be calling him to set up his injection. Please advise.

## 2021-03-17 NOTE — Telephone Encounter (Signed)
Will f/u with patient once labs result. We will courier medication to the clinic from [facility]. F/u in previous Prolia encounter.

## 2021-03-17 NOTE — Telephone Encounter (Signed)
Left VM for patient regarding Prolia labs. He should be back from vacation by now. Advised for him to have labs completed prior to appt with Dr. Estanislado Pandy and he should be able to receive Prolia at [place] on 04/01/21. Will continue to f/u  [name], PharmD, MPH Clinical Pharmacist (Rheumatology and Pulmonology)

## 2021-03-18 ENCOUNTER — Other Ambulatory Visit (HOSPITAL_COMMUNITY): Payer: Self-pay

## 2021-03-18 ENCOUNTER — Other Ambulatory Visit: Payer: Self-pay | Admitting: Pharmacist

## 2021-03-18 ENCOUNTER — Other Ambulatory Visit: Payer: Self-pay | Admitting: Family Medicine

## 2021-03-18 DIAGNOSIS — R058 Other specified cough: Secondary | ICD-10-CM

## 2021-03-18 LAB — COMPLETE METABOLIC PANEL WITH GFR
AG Ratio: 1.6 (calc) (ref 1.0–2.5)
ALT: 18 U/L (ref 9–46)
AST: 18 U/L (ref 10–35)
Albumin: 3.8 g/dL (ref 3.6–5.1)
Alkaline phosphatase (APISO): 64 U/L (ref 35–144)
BUN: 19 mg/dL (ref 7–25)
CO2: 25 mmol/L (ref 20–32)
Calcium: 9.2 mg/dL (ref 8.6–10.3)
Chloride: 108 mmol/L (ref 98–110)
Creat: 1.07 mg/dL (ref 0.70–1.18)
GFR, Est African American: 77 mL/min/{1.73_m2} (ref >=60)
GFR, Est Non African American: 67 mL/min/{1.73_m2} (ref >=60)
Globulin: 2.4 g/dL (calc) (ref 1.9–3.7)
Glucose, Bld: 97 mg/dL (ref 65–99)
Potassium: 4.4 mmol/L (ref 3.5–5.3)
Sodium: 140 mmol/L (ref 135–146)
Total Bilirubin: 0.7 mg/dL (ref 0.2–1.2)
Total Protein: 6.2 g/dL (ref 6.1–8.1)

## 2021-03-18 LAB — CBC WITH DIFFERENTIAL/PLATELET
Absolute Monocytes: 810 cells/uL (ref 200–950)
Basophils Absolute: 36 cells/uL (ref 0–200)
Basophils Relative: 0.4 %
Eosinophils Absolute: 519 cells/uL — ABNORMAL HIGH (ref 15–500)
Eosinophils Relative: 5.7 %
HCT: 36.9 % — ABNORMAL LOW (ref 38.5–50.0)
Hemoglobin: 12.4 g/dL — ABNORMAL LOW (ref 13.2–17.1)
Lymphs Abs: 2357 cells/uL (ref 850–3900)
MCH: 32.9 pg (ref 27.0–33.0)
MCHC: 33.6 g/dL (ref 32.0–36.0)
MCV: 97.9 fL (ref 80.0–100.0)
MPV: 8.9 fL (ref 7.5–12.5)
Monocytes Relative: 8.9 %
Neutro Abs: 5378 cells/uL (ref 1500–7800)
Neutrophils Relative %: 59.1 %
Platelets: 332 10*3/uL (ref 140–400)
RBC: 3.77 10*6/uL — ABNORMAL LOW (ref 4.20–5.80)
RDW: 12.6 % (ref 11.0–15.0)
Total Lymphocyte: 25.9 %
WBC: 9.1 10*3/uL (ref 3.8–10.8)

## 2021-03-18 LAB — VITAMIN D 25 HYDROXY (VIT D DEFICIENCY, FRACTURES): Vit D, 25-Hydroxy: 47 ng/mL (ref 30–100)

## 2021-03-18 MED ORDER — DENOSUMAB 60 MG/ML ~~LOC~~ SOSY
60.0000 mg | PREFILLED_SYRINGE | SUBCUTANEOUS | 1 refills | Status: DC
  Filled 2021-03-18 (×2): qty 1, 180d supply, fill #0
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
## 2021-03-18 NOTE — Progress Notes (Signed)
Opened in error

## 2021-03-18 NOTE — Telephone Encounter (Signed)
Patient had CMP, CBC, and Vitamin D on 03/17/21. All wnl to proceed with Prolia.  First Prolia was administered on 09/16/20.  Rx sent to [facility] to be couriered to clinic to be administered at upcoming OV with Dr. Estanislado Pandy on 04/01/21. Routing to [employer] to assist.  [name], PharmD, MPH Clinical Pharmacist (Rheumatology and Pulmonology)

## 2021-03-18 NOTE — Telephone Encounter (Signed)
Therigy updated, expected to be shipped to clinic on 03/30/21.

## 2021-03-18 NOTE — Telephone Encounter (Signed)
Left VM for patient to advise that we will plan to administer Prolia at his OV with Dr. Estanislado Pandy on 04/01/21 to forgo an additional clinic visit. Rx to be couriered from clinic on 03/30/21  Routing to [employer] as [place].  [name], PharmD, MPH Clinical Pharmacist (Rheumatology and Pulmonology)

## 2021-03-18 NOTE — Progress Notes (Signed)
Anemia is a stable.  CMP is normal.  Vitamin D is normal.

## 2021-03-19 DIAGNOSIS — R55 Syncope and collapse: Secondary | ICD-10-CM | POA: Diagnosis not present

## 2021-03-24 ENCOUNTER — Other Ambulatory Visit: Payer: Self-pay

## 2021-03-24 ENCOUNTER — Ambulatory Visit (HOSPITAL_BASED_OUTPATIENT_CLINIC_OR_DEPARTMENT_OTHER)
Admission: RE | Admit: 2021-03-24 | Discharge: 2021-03-24 | Disposition: A | Discharge status: Home / Self Care | Payer: Medicare Other | Source: Ambulatory Visit | Attending: Cardiology | Admitting: Cardiology

## 2021-03-24 ENCOUNTER — Encounter (HOSPITAL_BASED_OUTPATIENT_CLINIC_OR_DEPARTMENT_OTHER): Payer: Self-pay | Admitting: Family Medicine

## 2021-03-24 DIAGNOSIS — Z79899 Other long term (current) drug therapy: Secondary | ICD-10-CM | POA: Diagnosis not present

## 2021-03-24 DIAGNOSIS — L2081 Atopic neurodermatitis: Secondary | ICD-10-CM | POA: Diagnosis not present

## 2021-03-24 DIAGNOSIS — R55 Syncope and collapse: Secondary | ICD-10-CM | POA: Diagnosis not present

## 2021-03-24 DIAGNOSIS — Z85828 Personal history of other malignant neoplasm of skin: Secondary | ICD-10-CM | POA: Diagnosis not present

## 2021-03-24 DIAGNOSIS — L570 Actinic keratosis: Secondary | ICD-10-CM | POA: Diagnosis not present

## 2021-03-24 LAB — ECHOCARDIOGRAM COMPLETE
AR max vel: 3.46 cm2
AV Area VTI: 3.02 cm2
AV Area mean vel: 3.48 cm2
AV Mean grad: 6 mmHg
AV Peak grad: 11.6 mmHg
Ao pk vel: 1.7 m/s
Area-P 1/2: 2.71 cm2
Calc EF: 72.1 %
S' Lateral: 2.15 cm
Single Plane A2C EF: 73.3 %
Single Plane A4C EF: 69.6 %

## 2021-03-24 NOTE — Progress Notes (Signed)
*  PRELIMINARY RESULTS* Echocardiogram 2D Echocardiogram has been performed.  Luisa Hart RDCS 03/24/2021, 1:06 PM

## 2021-03-29 ENCOUNTER — Other Ambulatory Visit (HOSPITAL_BASED_OUTPATIENT_CLINIC_OR_DEPARTMENT_OTHER): Payer: Medicare Other

## 2021-03-29 ENCOUNTER — Other Ambulatory Visit (HOSPITAL_COMMUNITY): Payer: Self-pay

## 2021-03-30 ENCOUNTER — Other Ambulatory Visit (HOSPITAL_COMMUNITY): Payer: Self-pay

## 2021-03-31 ENCOUNTER — Telehealth: Payer: Self-pay

## 2021-03-31 ENCOUNTER — Telehealth: Payer: Self-pay | Admitting: Cardiology

## 2021-03-31 NOTE — Telephone Encounter (Signed)
Patient informed of results.  

## 2021-03-31 NOTE — Telephone Encounter (Signed)
-----   Message from Park Liter, MD sent at 03/29/2021 11:13 AM EDT ----- Echocardiogram looks good, ejection fraction normal, overall looks good

## 2021-03-31 NOTE — Telephone Encounter (Signed)
PT is returning a call 

## 2021-03-31 NOTE — Telephone Encounter (Signed)
Prolia arrived at clinic from [facility]. Placed in refrigerator. Patient can receive Prolia at [place] on 04/01/21. Routing to [place] as Shon Millet, PharmD, MPH Clinical Pharmacist (Rheumatology and Pulmonology)

## 2021-04-01 ENCOUNTER — Encounter: Payer: Self-pay | Admitting: Rheumatology

## 2021-04-01 ENCOUNTER — Other Ambulatory Visit: Payer: Self-pay

## 2021-04-01 ENCOUNTER — Ambulatory Visit (INDEPENDENT_AMBULATORY_CARE_PROVIDER_SITE_OTHER): Payer: Medicare Other | Admitting: Rheumatology

## 2021-04-01 VITALS — BP 145/88 | HR 72 | Ht 69.5 in | Wt 147.0 lb

## 2021-04-01 DIAGNOSIS — M810 Age-related osteoporosis without current pathological fracture: Secondary | ICD-10-CM | POA: Diagnosis not present

## 2021-04-01 DIAGNOSIS — I712 Thoracic aortic aneurysm, without rupture, unspecified: Secondary | ICD-10-CM

## 2021-04-01 DIAGNOSIS — Z9889 Other specified postprocedural states: Secondary | ICD-10-CM | POA: Diagnosis not present

## 2021-04-01 DIAGNOSIS — M19041 Primary osteoarthritis, right hand: Secondary | ICD-10-CM | POA: Diagnosis not present

## 2021-04-01 DIAGNOSIS — R911 Solitary pulmonary nodule: Secondary | ICD-10-CM | POA: Diagnosis not present

## 2021-04-01 DIAGNOSIS — Z872 Personal history of diseases of the skin and subcutaneous tissue: Secondary | ICD-10-CM

## 2021-04-01 DIAGNOSIS — N132 Hydronephrosis with renal and ureteral calculous obstruction: Secondary | ICD-10-CM

## 2021-04-01 DIAGNOSIS — Z970 Presence of artificial eye: Secondary | ICD-10-CM

## 2021-04-01 DIAGNOSIS — Z7952 Long term (current) use of systemic steroids: Secondary | ICD-10-CM | POA: Diagnosis not present

## 2021-04-01 DIAGNOSIS — Z862 Personal history of diseases of the blood and blood-forming organs and certain disorders involving the immune mechanism: Secondary | ICD-10-CM

## 2021-04-01 DIAGNOSIS — M353 Polymyalgia rheumatica: Secondary | ICD-10-CM | POA: Diagnosis not present

## 2021-04-01 DIAGNOSIS — I10 Essential (primary) hypertension: Secondary | ICD-10-CM

## 2021-04-01 DIAGNOSIS — E559 Vitamin D deficiency, unspecified: Secondary | ICD-10-CM | POA: Diagnosis not present

## 2021-04-01 DIAGNOSIS — M5030 Other cervical disc degeneration, unspecified cervical region: Secondary | ICD-10-CM

## 2021-04-01 DIAGNOSIS — M19042 Primary osteoarthritis, left hand: Secondary | ICD-10-CM

## 2021-04-01 DIAGNOSIS — Z8719 Personal history of other diseases of the digestive system: Secondary | ICD-10-CM

## 2021-04-01 MED ORDER — DENOSUMAB 60 MG/ML ~~LOC~~ SOSY
60.0000 mg | PREFILLED_SYRINGE | Freq: Once | SUBCUTANEOUS | Status: AC
  Administered 2021-04-01: 60 mg via SUBCUTANEOUS

## 2021-04-01 NOTE — Progress Notes (Signed)
Pharmacy Note  Subjective:   Patient presents to clinic today to receive bi-annual dose of Prolia.  Patient running a fever or have signs/symptoms of infection? No  Patient currently on antibiotics for the treatment of infection? No  Patient had fall in the last 6 months?  No  If yes, did it require medical attention? No   Patient taking calcium 1200 mg daily through diet or supplement and at least 800 units vitamin D? Yes  Objective: CMP     Component Value Date/Time   NA 140 03/17/2021 1059   NA 137 02/02/2021 1640   K 4.4 03/17/2021 1059   CL 108 03/17/2021 1059   CO2 25 03/17/2021 1059   GLUCOSE 97 03/17/2021 1059   BUN 19 03/17/2021 1059   BUN 16 02/02/2021 1640   CREATININE 1.07 03/17/2021 1059   CALCIUM 9.2 03/17/2021 1059   PROT 6.2 03/17/2021 1059   PROT 6.0 08/05/2019 1340   ALBUMIN 3.9 08/05/2019 1340   AST 18 03/17/2021 1059   ALT 18 03/17/2021 1059   ALKPHOS 72 08/05/2019 1340   BILITOT 0.7 03/17/2021 1059   BILITOT 0.5 08/05/2019 1340   GFRNONAA 67 03/17/2021 1059   GFRAA 77 03/17/2021 1059    CBC    Component Value Date/Time   WBC 9.1 03/17/2021 1059   RBC 3.77 (L) 03/17/2021 1059   HGB 12.4 (L) 03/17/2021 1059   HGB 12.1 (L) 02/02/2021 1640   HCT 36.9 (L) 03/17/2021 1059   HCT 35.4 (L) 02/02/2021 1640   PLT 332 03/17/2021 1059   PLT 364 02/02/2021 1640   MCV 97.9 03/17/2021 1059   MCV 95 02/02/2021 1640   MCH 32.9 03/17/2021 1059   MCHC 33.6 03/17/2021 1059   RDW 12.6 03/17/2021 1059   RDW 12.4 02/02/2021 1640   LYMPHSABS 2,357 03/17/2021 1059   LYMPHSABS 2.4 08/22/2018 1202   MONOABS 944 03/01/2016 1214   EOSABS 519 (H) 03/17/2021 1059   EOSABS 0.6 (H) 08/22/2018 1202   BASOSABS 36 03/17/2021 1059   BASOSABS 0.1 08/22/2018 1202    Lab Results  Component Value Date   VD25OH 47 03/17/2021    T-score: -2.9 in the right femoral neck  Assessment/Plan:   Administrations This Visit     denosumab (PROLIA) injection 60 mg     Admin  Date 04/01/2021 Action Given Dose 60 mg Route Subcutaneous Administered By Carole Binning, LPN             Patient tolerated injection well.    All questions encouraged and answered.  Instructed patient to call with any further questions or concerns.

## 2021-04-01 NOTE — Telephone Encounter (Signed)
Called patient he sates "Someone called me back and I got it situated".

## 2021-04-01 NOTE — Patient Instructions (Signed)
Cervical Strain and Sprain Rehab Ask your health care provider which exercises are safe for you. Do exercises exactly as told by your health care provider and adjust them as directed. It is normal to feel mild stretching, pulling, tightness, or discomfort as you do these exercises. Stop right away if you feel sudden pain or your pain gets worse. Do not begin these exercises until told by your health care provider. Stretching and range-of-motion exercises Cervical side bending  Using good posture, sit on a stable chair or stand up. Without moving your shoulders, slowly tilt your left / right ear to your shoulder until you feel a stretch in the opposite side neck muscles. You should be looking straight ahead. Hold for __________ seconds. Repeat with the other side of your neck. Repeat __________ times. Complete this exercise __________ times a day. Cervical rotation  Using good posture, sit on a stable chair or stand up. Slowly turn your head to the side as if you are looking over your left / right shoulder. Keep your eyes level with the ground. Stop when you feel a stretch along the side and the back of your neck. Hold for __________ seconds. Repeat this by turning to your other side. Repeat __________ times. Complete this exercise __________ times a day. Thoracic extension and pectoral stretch Roll a towel or a small blanket so it is about 4 inches (10 cm) in diameter. Lie down on your back on a firm surface. Put the towel lengthwise, under your spine in the middle of your back. It should not be under your shoulder blades. The towel should line up with your spine from your middle back to your lower back. Put your hands behind your head and let your elbows fall out to your sides. Hold for __________ seconds. Repeat __________ times. Complete this exercise __________ times a day. Strengthening exercises Isometric upper cervical flexion Lie on your back with a thin pillow behind your head  and a small rolled-up towel under your neck. Gently tuck your chin toward your chest and nod your head down to look toward your feet. Do not lift your head off the pillow. Hold for __________ seconds. Release the tension slowly. Relax your neck muscles completely before you repeat this exercise. Repeat __________ times. Complete this exercise __________ times a day. Isometric cervical extension  Stand about 6 inches (15 cm) away from a wall, with your back facing the wall. Place a soft object, about 6-8 inches (15-20 cm) in diameter, between the back of your head and the wall. A soft object could be a small pillow, a ball, or a folded towel. Gently tilt your head back and press into the soft object. Keep your jaw and forehead relaxed. Hold for __________ seconds. Release the tension slowly. Relax your neck muscles completely before you repeat this exercise. Repeat __________ times. Complete this exercise __________ times a day. Posture and body mechanics Body mechanics refers to the movements and positions of your body while you do your daily activities. Posture is part of body mechanics. Good posture and healthy body mechanics can help to relieve stress in your body's tissues and joints. Good posture means that your spine is in its natural S-curve position (your spine is neutral), your shoulders are pulled back slightly, and your head is not tipped forward. The following are general guidelines for applying improved posture andbody mechanics to your everyday activities. Sitting  When sitting, keep your spine neutral and keep your feet flat on the floor. Use   a footrest, if necessary, and keep your thighs parallel to the floor. Avoid rounding your shoulders, and avoid tilting your head forward. When working at a desk or a computer, keep your desk at a height where your hands are slightly lower than your elbows. Slide your chair under your desk so you are close enough to maintain good posture. When  working at a computer, place your monitor at a height where you are looking straight ahead and you do not have to tilt your head forward or downward to look at the screen.  Standing  When standing, keep your spine neutral and keep your feet about hip-width apart. Keep a slight bend in your knees. Your ears, shoulders, and hips should line up. When you do a task in which you stand in one place for a long time, place one foot up on a stable object that is 2-4 inches (5-10 cm) high, such as a footstool. This helps keep your spine neutral.  Resting When lying down and resting, avoid positions that are most painful for you. Try to support your neck in a neutral position. You can use a contour pillow or asmall rolled-up towel. Your pillow should support your neck but not push on it. This information is not intended to replace advice given to you by your health care provider. Make sure you discuss any questions you have with your healthcare provider. Document Revised: 01/09/2019 Document Reviewed: 06/20/2018 [employer] Patient Education  2022 [employer] Back Exercises The following exercises strengthen the muscles that help to support the trunk and back. They also help to keep the lower back flexible. Doing these exercises can help to prevent back pain or lessen existing pain. If you have back pain or discomfort, try doing these exercises 2-3 times each day or as told by your health care provider. As your pain improves, do them once each day, but increase the number of times that you repeat the steps for each exercise (do more repetitions). To prevent the recurrence of back pain, continue to do these exercises once each day or as told by your health care provider. Do exercises exactly as told by your health care provider and adjust them as directed. It is normal to feel mild stretching, pulling, tightness, or discomfort as you do these exercises, but you should stop right away if youfeel sudden pain or  your pain gets worse. Exercises Single knee to chest Repeat these steps 3-5 times for each leg: Lie on your back on a firm bed or the floor with your legs extended. Bring one knee to your chest. Your other leg should stay extended and in contact with the floor. Hold your knee in place by grabbing your knee or thigh with both hands and hold. Pull on your knee until you feel a gentle stretch in your lower back or buttocks. Hold the stretch for 10-30 seconds. Slowly release and straighten your leg. Pelvic tilt Repeat these steps 5-10 times: Lie on your back on a firm bed or the floor with your legs extended. Bend your knees so they are pointing toward the ceiling and your feet are flat on the floor. Tighten your lower abdominal muscles to press your lower back against the floor. This motion will tilt your pelvis so your tailbone points up toward the ceiling instead of pointing to your feet or the floor. With gentle tension and even breathing, hold this position for 5-10 seconds. Cat-cow Repeat these steps until your lower back becomes more flexible: Get   into a hands-and-knees position on a firm surface. Keep your hands under your shoulders, and keep your knees under your hips. You may place padding under your knees for comfort. Let your head hang down toward your chest. Contract your abdominal muscles and point your tailbone toward the floor so your lower back becomes rounded like the back of a cat. Hold this position for 5 seconds. Slowly lift your head, let your abdominal muscles relax and point your tailbone up toward the ceiling so your back forms a sagging arch like the back of a cow. Hold this position for 5 seconds.  Press-ups Repeat these steps 5-10 times: Lie on your abdomen (face-down) on the floor. Place your palms near your head, about shoulder-width apart. Keeping your back as relaxed as possible and keeping your hips on the floor, slowly straighten your arms to raise the top  half of your body and lift your shoulders. Do not use your back muscles to raise your upper torso. You may adjust the placement of your hands to make yourself more comfortable. Hold this position for 5 seconds while you keep your back relaxed. Slowly return to lying flat on the floor.  Bridges Repeat these steps 10 times: Lie on your back on a firm surface. Bend your knees so they are pointing toward the ceiling and your feet are flat on the floor. Your arms should be flat at your sides, next to your body. Tighten your buttocks muscles and lift your buttocks off the floor until your waist is at almost the same height as your knees. You should feel the muscles working in your buttocks and the back of your thighs. If you do not feel these muscles, slide your feet 1-2 inches farther away from your buttocks. Hold this position for 3-5 seconds. Slowly lower your hips to the starting position, and allow your buttocks muscles to relax completely. If this exercise is too easy, try doing it with your arms crossed over yourchest. Abdominal crunches Repeat these steps 5-10 times: Lie on your back on a firm bed or the floor with your legs extended. Bend your knees so they are pointing toward the ceiling and your feet are flat on the floor. Cross your arms over your chest. Tip your chin slightly toward your chest without bending your neck. Tighten your abdominal muscles and slowly raise your trunk (torso) high enough to lift your shoulder blades a tiny bit off the floor. Avoid raising your torso higher than that because it can put too much stress on your low back and does not help to strengthen your abdominal muscles. Slowly return to your starting position. Back lifts Repeat these steps 5-10 times: Lie on your abdomen (face-down) with your arms at your sides, and rest your forehead on the floor. Tighten the muscles in your legs and your buttocks. Slowly lift your chest off the floor while you keep your  hips pressed to the floor. Keep the back of your head in line with the curve in your back. Your eyes should be looking at the floor. Hold this position for 3-5 seconds. Slowly return to your starting position. Contact a health care provider if: Your back pain or discomfort gets much worse when you do an exercise. Your worsening back pain or discomfort does not lessen within 2 hours after you exercise. If you have any of these problems, stop doing these exercises right away. Do not do them again unless your health care provider says that you can. Get help   right away if: You develop sudden, severe back pain. If this happens, stop doing the exercises right away. Do not do them again unless your health care provider says that you can. This information is not intended to replace advice given to you by your health care provider. Make sure you discuss any questions you have with your healthcare provider. Document Revised: 01/24/2019 Document Reviewed: 06/21/2018 [employer] Patient Education  2022 [employer]  

## 2021-04-07 ENCOUNTER — Other Ambulatory Visit: Payer: Self-pay | Admitting: Family Medicine

## 2021-04-07 DIAGNOSIS — R058 Other specified cough: Secondary | ICD-10-CM

## 2021-04-09 ENCOUNTER — Telehealth: Payer: Self-pay

## 2021-04-09 NOTE — Telephone Encounter (Signed)
Left message on patients voicemail to please return our call.   

## 2021-04-09 NOTE — Telephone Encounter (Signed)
-----   Message from Park Liter, MD sent at 04/09/2021  8:34 AM EDT ----- Monitor showed some supraventricular tachycardia which is not dangerous, it is asymptomatic.  He is triggered events were for sinus rhythm.

## 2021-04-14 ENCOUNTER — Telehealth: Payer: Self-pay | Admitting: Cardiology

## 2021-04-14 NOTE — Telephone Encounter (Signed)
Follow Up:      Patient  is returning a call from today, concerning his results.

## 2021-04-14 NOTE — Telephone Encounter (Signed)
Reviewed results with patient and he verbalized understanding with no further questions at this time. 

## 2021-04-21 DIAGNOSIS — M1990 Unspecified osteoarthritis, unspecified site: Secondary | ICD-10-CM | POA: Insufficient documentation

## 2021-04-22 ENCOUNTER — Encounter: Payer: Self-pay | Admitting: Cardiology

## 2021-04-22 ENCOUNTER — Other Ambulatory Visit: Payer: Self-pay

## 2021-04-22 ENCOUNTER — Ambulatory Visit (INDEPENDENT_AMBULATORY_CARE_PROVIDER_SITE_OTHER): Payer: Medicare Other | Admitting: Cardiology

## 2021-04-22 VITALS — BP 150/80 | HR 94 | Ht 69.5 in | Wt 143.0 lb

## 2021-04-22 DIAGNOSIS — I712 Thoracic aortic aneurysm, without rupture, unspecified: Secondary | ICD-10-CM

## 2021-04-22 DIAGNOSIS — C641 Malignant neoplasm of right kidney, except renal pelvis: Secondary | ICD-10-CM

## 2021-04-22 DIAGNOSIS — I10 Essential (primary) hypertension: Secondary | ICD-10-CM | POA: Diagnosis not present

## 2021-04-22 DIAGNOSIS — R55 Syncope and collapse: Secondary | ICD-10-CM | POA: Diagnosis not present
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
## 2021-04-22 NOTE — Patient Instructions (Signed)

## 2021-05-06 ENCOUNTER — Other Ambulatory Visit: Payer: Self-pay | Admitting: Family Medicine

## 2021-05-06 DIAGNOSIS — R058 Other specified cough: Secondary | ICD-10-CM

## 2021-05-17 DIAGNOSIS — Z1211 Encounter for screening for malignant neoplasm of colon: Secondary | ICD-10-CM | POA: Diagnosis not present

## 2021-05-17 DIAGNOSIS — K5730 Diverticulosis of large intestine without perforation or abscess without bleeding: Secondary | ICD-10-CM | POA: Diagnosis not present

## 2021-05-17 DIAGNOSIS — K648 Other hemorrhoids: Secondary | ICD-10-CM | POA: Diagnosis not present

## 2021-05-31 DIAGNOSIS — N210 Calculus in bladder: Secondary | ICD-10-CM | POA: Diagnosis not present

## 2021-05-31 DIAGNOSIS — R350 Frequency of micturition: Secondary | ICD-10-CM | POA: Diagnosis not present

## 2021-05-31 DIAGNOSIS — N5201 Erectile dysfunction due to arterial insufficiency: Secondary | ICD-10-CM | POA: Diagnosis not present

## 2021-05-31 DIAGNOSIS — Q646 Congenital diverticulum of bladder: Secondary | ICD-10-CM | POA: Diagnosis not present

## 2021-05-31 DIAGNOSIS — C641 Malignant neoplasm of right kidney, except renal pelvis: Secondary | ICD-10-CM | POA: Diagnosis not present

## 2021-06-01 ENCOUNTER — Other Ambulatory Visit: Payer: Self-pay | Admitting: Family Medicine

## 2021-06-01 DIAGNOSIS — R058 Other specified cough: Secondary | ICD-10-CM

## 2021-06-03 NOTE — Progress Notes (Signed)
    SUBJECTIVE:   CHIEF COMPLAINT / HPI:   Brian Leon is a 75 yo M who presents for follow up for the below. Doing well today. No concerns.  Hx of syncope No episodes since our last visit. Has seen the cardiologist who recommended taking his flomax at night and rising slowly from a seated position. Today he denies dyspnea, dizziness, and lightheadedness.   Medication concern Using albuterol up to 5x daily. He denies increased work of breathing or wheezing. Taking advair as prescribed. Will sometimes use albuterol prior to walks or before bed. Needs refill on flonase.   HM Need for vaccination-shingrix  PERTINENT  PMH / PSH: hx of HTN, Asthma, stable AAA, marijuana smoker, hx of BPH  OBJECTIVE:   BP 130/80   Pulse 61   Ht '5\' 9"'$  (1.753 m)   Wt 142 lb 6 oz (64.6 kg)   SpO2 99%   BMI 21.03 kg/m   Physical Exam Constitutional:      General: He is not in acute distress.    Appearance: He is not ill-appearing or diaphoretic.  Cardiovascular:     Rate and Rhythm: Normal rate and regular rhythm.     Heart sounds: Normal heart sounds.  Pulmonary:     Effort: Pulmonary effort is normal.     Breath sounds: Normal breath sounds. No wheezing.  Musculoskeletal:     Right lower leg: No edema.     Left lower leg: No edema.  Neurological:     Mental Status: He is alert.     ASSESSMENT/PLAN:   1. Hx of syncope Seen in our office 5/3 initially for this issue. Cardiology note from 5/27 reviewed and thought to be vasovagal v. Orthostatic hypotension precipitated by flomax. He has since started taking this medication at night and rising slowly from a seated position. He has not had any further episodes. Follow up if reoccurs.   2. Mild intermittent chronic asthma without complication This provider was receiving frequent medication refills for albuterol. Discussed decreasing use of albuterol and continued maintenance use of advair. Patient voiced understanding.  3. Allergic rhinitis  due to pollen, unspecified seasonality Medication refill - fluticasone (FLONASE) 50 MCG/ACT nasal spray; Place 2 sprays into both nostrils daily.  Dispense: 48 mL; Refill: 4  4. Need for vaccination - Zoster Vaccine Adjuvanted [facility]) injection; Inject 0.5 mLs into the muscle once for 1 dose.  Dispense: 0.5 mL; Refill: 0 -Follow up for flu vaccine  Gerlene Fee, [place]

## 2021-06-04 ENCOUNTER — Other Ambulatory Visit: Payer: Self-pay

## 2021-06-04 ENCOUNTER — Encounter: Payer: Self-pay | Admitting: Family Medicine

## 2021-06-04 ENCOUNTER — Ambulatory Visit (INDEPENDENT_AMBULATORY_CARE_PROVIDER_SITE_OTHER): Payer: Medicare Other | Admitting: Family Medicine

## 2021-06-04 VITALS — BP 130/80 | HR 61 | Ht 69.0 in | Wt 142.4 lb

## 2021-06-04 DIAGNOSIS — Z87898 Personal history of other specified conditions: Secondary | ICD-10-CM | POA: Diagnosis not present

## 2021-06-04 DIAGNOSIS — I10 Essential (primary) hypertension: Secondary | ICD-10-CM

## 2021-06-04 DIAGNOSIS — J301 Allergic rhinitis due to pollen: Secondary | ICD-10-CM

## 2021-06-04 DIAGNOSIS — J4520 Mild intermittent asthma, uncomplicated: Secondary | ICD-10-CM | POA: Diagnosis not present

## 2021-06-04 DIAGNOSIS — Z23 Encounter for immunization: Secondary | ICD-10-CM

## 2021-06-04 MED ORDER — SHINGRIX 50 MCG/0.5ML IM SUSR: 0.5000 mL | Freq: Once | INTRAMUSCULAR | 0 refills | Status: AC

## 2021-06-04 MED ORDER — FLUTICASONE PROPIONATE 50 MCG/ACT NA SUSP: 2.0000 | Freq: Every day | NASAL | 4 refills | Status: DC

## 2021-06-04 NOTE — Patient Instructions (Addendum)
Thank for coming in today.  Today we discussed continuing tamsulosin taking it at night so that your blood pressure does not drop too low when you go from sitting to standing.  We also discussed rising slowly in the middle the night if you do get up.  We also discussed decreasing use of albuterol inhaler keeping twice daily use of your Advair.  Call to check with clinic in 1 month to schedule flu vaccine. Please call back to let us know when you received your 2nd COVID booster so that we may update your chart.   Dr. Janus Molder

## 2021-06-20 DIAGNOSIS — Z23 Encounter for immunization: Secondary | ICD-10-CM | POA: Diagnosis not present

## 2021-06-29 ENCOUNTER — Other Ambulatory Visit: Payer: Self-pay

## 2021-06-29 MED ORDER — PREDNISONE 1 MG PO TABS: 3.0000 mg | ORAL_TABLET | Freq: Every day | ORAL | 0 refills | Status: DC

## 2021-06-29 NOTE — Telephone Encounter (Signed)
Next Visit: 10/05/2021  Last Visit: 04/01/2021  Last Fill:03/15/2021  Dx: Polymyalgia rheumatica   Current Dose per office note on 04/01/2021: we will stay on prednisone 3 mg p.o. daily.  He does not want to taper prednisone at this point.  Okay to refill Prednisone?

## 2021-06-29 NOTE — Telephone Encounter (Signed)
Patient called requesting prescription refill of Prednisone 1 mg (3 month supply) to be sent to [facility] at [address].

## 2021-06-30 ENCOUNTER — Other Ambulatory Visit: Payer: Self-pay | Admitting: Family Medicine

## 2021-06-30 DIAGNOSIS — R058 Other specified cough: Secondary | ICD-10-CM

## 2021-07-08 DIAGNOSIS — Z970 Presence of artificial eye: Secondary | ICD-10-CM | POA: Diagnosis not present

## 2021-07-08 DIAGNOSIS — H2512 Age-related nuclear cataract, left eye: Secondary | ICD-10-CM | POA: Diagnosis not present

## 2021-08-15 ENCOUNTER — Other Ambulatory Visit: Payer: Self-pay | Admitting: Family Medicine

## 2021-09-01 DIAGNOSIS — L570 Actinic keratosis: Secondary | ICD-10-CM | POA: Diagnosis not present

## 2021-09-01 DIAGNOSIS — C44319 Basal cell carcinoma of skin of other parts of face: Secondary | ICD-10-CM | POA: Diagnosis not present

## 2021-09-01 DIAGNOSIS — L97921 Non-pressure chronic ulcer of unspecified part of left lower leg limited to breakdown of skin: Secondary | ICD-10-CM | POA: Diagnosis not present

## 2021-09-01 DIAGNOSIS — L97821 Non-pressure chronic ulcer of other part of left lower leg limited to breakdown of skin: Secondary | ICD-10-CM | POA: Diagnosis not present

## 2021-09-01 DIAGNOSIS — L821 Other seborrheic keratosis: Secondary | ICD-10-CM | POA: Diagnosis not present

## 2021-09-02 ENCOUNTER — Other Ambulatory Visit (HOSPITAL_COMMUNITY): Payer: Self-pay

## 2021-09-06 ENCOUNTER — Telehealth: Payer: Self-pay | Admitting: Pharmacist

## 2021-09-06 ENCOUNTER — Other Ambulatory Visit (HOSPITAL_COMMUNITY): Payer: Self-pay

## 2021-09-06 DIAGNOSIS — Z79899 Other long term (current) drug therapy: Secondary | ICD-10-CM

## 2021-09-06 DIAGNOSIS — M810 Age-related osteoporosis without current pathological fracture: Secondary | ICD-10-CM

## 2021-09-06 NOTE — Telephone Encounter (Signed)
Patient due for Prolia on 09/28/21. He has OV on 10/05/21 at 10:15am. Can receive Prolia at that visit if he has labs completed prior to visit and as long as labs are wnl. Will call patient next week to advise  Ran test claim for Prolia, and he has no copay for Prolia through pharmacy benefit  [name], PharmD, MPH, BCPS Clinical Pharmacist (Rheumatology and Pulmonology)

## 2021-09-13 ENCOUNTER — Other Ambulatory Visit: Payer: Self-pay | Admitting: Rheumatology

## 2021-09-13 NOTE — Telephone Encounter (Signed)
Patient called the office requesting a refill of Prednisone. Would like it filled at [place].

## 2021-09-14 ENCOUNTER — Other Ambulatory Visit: Payer: Self-pay | Admitting: Family Medicine

## 2021-09-14 DIAGNOSIS — R058 Other specified cough: Secondary | ICD-10-CM

## 2021-09-14 MED ORDER — PREDNISONE 1 MG PO TABS: 3.0000 mg | ORAL_TABLET | Freq: Every day | ORAL | 0 refills | Status: DC

## 2021-09-14 NOTE — Telephone Encounter (Signed)
Next Visit: 10/05/2021  Last Visit: 04/01/2021  Last Fill: 06/29/2021  Dx: Polymyalgia rheumatica   Current Dose per office note on 04/01/2021: prednisone 3 mg p.o. daily  Okay to refill Prednisone?

## 2021-09-15 NOTE — Telephone Encounter (Signed)
ATC patient to review potentially receiving Prolia at [place] on 10/05/21. Unable to reach. Left VM requesting return call to discuss  [name], PharmD, MPH, BCPS Clinical Pharmacist (Rheumatology and Pulmonology)

## 2021-09-15 NOTE — Telephone Encounter (Signed)
Patient returned call.  He states he will stop by the clinic after 1:30 pm on 09/20/21 to update CBC, CMP. Future orders placed today for labs.  Once labs result, we will call to have Prolia appointment scheduled with pharmacy clinic. He will be going out of town from 09/21/21 through 10/06/20 and will have to schedule appt to be after he returns to town  [name], PharmD, MPH, BCPS Clinical Pharmacist (Rheumatology and Pulmonology)

## 2021-09-18 DIAGNOSIS — Z23 Encounter for immunization: Secondary | ICD-10-CM | POA: Diagnosis not present

## 2021-09-20 ENCOUNTER — Other Ambulatory Visit: Payer: Self-pay | Admitting: *Deleted

## 2021-09-20 DIAGNOSIS — Z79899 Other long term (current) drug therapy: Secondary | ICD-10-CM

## 2021-09-20 DIAGNOSIS — M810 Age-related osteoporosis without current pathological fracture: Secondary | ICD-10-CM | POA: Diagnosis not present

## 2021-09-21 LAB — COMPREHENSIVE METABOLIC PANEL
AG Ratio: 1.6 (calc) (ref 1.0–2.5)
ALT: 15 U/L (ref 9–46)
AST: 14 U/L (ref 10–35)
Albumin: 3.8 g/dL (ref 3.6–5.1)
Alkaline phosphatase (APISO): 65 U/L (ref 35–144)
BUN: 17 mg/dL (ref 7–25)
CO2: 27 mmol/L (ref 20–32)
Calcium: 9.2 mg/dL (ref 8.6–10.3)
Chloride: 109 mmol/L (ref 98–110)
Creat: 0.97 mg/dL (ref 0.70–1.28)
Globulin: 2.4 g/dL (calc) (ref 1.9–3.7)
Glucose, Bld: 110 mg/dL — ABNORMAL HIGH (ref 65–99)
Potassium: 4.7 mmol/L (ref 3.5–5.3)
Sodium: 143 mmol/L (ref 135–146)
Total Bilirubin: 0.5 mg/dL (ref 0.2–1.2)
Total Protein: 6.2 g/dL (ref 6.1–8.1)

## 2021-09-21 LAB — CBC WITH DIFFERENTIAL/PLATELET
Absolute Monocytes: 885 cells/uL (ref 200–950)
Basophils Absolute: 67 cells/uL (ref 0–200)
Basophils Relative: 0.6 %
Eosinophils Absolute: 1086 cells/uL — ABNORMAL HIGH (ref 15–500)
Eosinophils Relative: 9.7 %
HCT: 37.6 % — ABNORMAL LOW (ref 38.5–50.0)
Hemoglobin: 12.6 g/dL — ABNORMAL LOW (ref 13.2–17.1)
Lymphs Abs: 2285 cells/uL (ref 850–3900)
MCH: 33.4 pg — ABNORMAL HIGH (ref 27.0–33.0)
MCHC: 33.5 g/dL (ref 32.0–36.0)
MCV: 99.7 fL (ref 80.0–100.0)
MPV: 9.2 fL (ref 7.5–12.5)
Monocytes Relative: 7.9 %
Neutro Abs: 6877 cells/uL (ref 1500–7800)
Neutrophils Relative %: 61.4 %
Platelets: 402 10*3/uL — ABNORMAL HIGH (ref 140–400)
RBC: 3.77 10*6/uL — ABNORMAL LOW (ref 4.20–5.80)
RDW: 12.1 % (ref 11.0–15.0)
Total Lymphocyte: 20.4 %
WBC: 11.2 10*3/uL — ABNORMAL HIGH (ref 3.8–10.8)

## 2021-09-21 NOTE — Progress Notes (Signed)
Glucose is mildly elevated.  White cell count and platelets are elevated most likely due to prednisone use.  Hemoglobin is low and stable.  Eosinophil count is high most likely due to allergies.

## 2021-09-22 ENCOUNTER — Other Ambulatory Visit (HOSPITAL_COMMUNITY): Payer: Self-pay

## 2021-09-22 MED ORDER — DENOSUMAB 60 MG/ML ~~LOC~~ SOSY
60.0000 mg | PREFILLED_SYRINGE | SUBCUTANEOUS | 0 refills | Status: DC
  Filled 2021-09-22: qty 1, 180d supply, fill #0

## 2021-09-22 NOTE — Telephone Encounter (Signed)
Patient had labs completed on 09/20/21 - Glucose is mildly elevated.  White cell count and platelets are elevated most likely due to prednisone use.  Hemoglobin is low and stable. Eosinophil count is high most likely due to allergies.    Rx for Prolia sent to [facility] to be couriered to clinic by 10/05/21.  [name], PharmD, MPH, BCPS Clinical Pharmacist (Rheumatology and Pulmonology)

## 2021-09-24 ENCOUNTER — Other Ambulatory Visit (HOSPITAL_COMMUNITY): Payer: Self-pay

## 2021-10-01 ENCOUNTER — Other Ambulatory Visit (HOSPITAL_COMMUNITY): Payer: Self-pay

## 2021-10-05 ENCOUNTER — Ambulatory Visit: Payer: Medicare Other | Admitting: Rheumatology

## 2021-10-05 NOTE — Telephone Encounter (Signed)
Prolia received in the office and placed in the fridge.

## 2021-10-06 NOTE — Telephone Encounter (Signed)
Left VM for patient to return call to schedule Prolia injection appointment.  [name], PharmD, MPH, BCPS Clinical Pharmacist (Rheumatology and Pulmonology)

## 2021-10-10 ENCOUNTER — Other Ambulatory Visit: Payer: Self-pay | Admitting: Family Medicine

## 2021-10-10 DIAGNOSIS — R058 Other specified cough: Secondary | ICD-10-CM

## 2021-10-11 ENCOUNTER — Encounter: Payer: Self-pay | Admitting: Physician Assistant

## 2021-10-11 ENCOUNTER — Other Ambulatory Visit: Payer: Self-pay

## 2021-10-11 ENCOUNTER — Ambulatory Visit (INDEPENDENT_AMBULATORY_CARE_PROVIDER_SITE_OTHER): Payer: Medicare Other | Admitting: Physician Assistant

## 2021-10-11 VITALS — BP 127/83 | HR 94 | Ht 69.0 in | Wt 146.2 lb

## 2021-10-11 DIAGNOSIS — Z9889 Other specified postprocedural states: Secondary | ICD-10-CM

## 2021-10-11 DIAGNOSIS — M353 Polymyalgia rheumatica: Secondary | ICD-10-CM

## 2021-10-11 DIAGNOSIS — R911 Solitary pulmonary nodule: Secondary | ICD-10-CM | POA: Diagnosis not present

## 2021-10-11 DIAGNOSIS — D75839 Thrombocytosis, unspecified: Secondary | ICD-10-CM

## 2021-10-11 DIAGNOSIS — M810 Age-related osteoporosis without current pathological fracture: Secondary | ICD-10-CM

## 2021-10-11 DIAGNOSIS — Z862 Personal history of diseases of the blood and blood-forming organs and certain disorders involving the immune mechanism: Secondary | ICD-10-CM

## 2021-10-11 DIAGNOSIS — I10 Essential (primary) hypertension: Secondary | ICD-10-CM | POA: Diagnosis not present

## 2021-10-11 DIAGNOSIS — E559 Vitamin D deficiency, unspecified: Secondary | ICD-10-CM | POA: Diagnosis not present

## 2021-10-11 DIAGNOSIS — Z970 Presence of artificial eye: Secondary | ICD-10-CM

## 2021-10-11 DIAGNOSIS — Z7952 Long term (current) use of systemic steroids: Secondary | ICD-10-CM

## 2021-10-11 DIAGNOSIS — M19041 Primary osteoarthritis, right hand: Secondary | ICD-10-CM

## 2021-10-11 DIAGNOSIS — Z79899 Other long term (current) drug therapy: Secondary | ICD-10-CM

## 2021-10-11 DIAGNOSIS — M5030 Other cervical disc degeneration, unspecified cervical region: Secondary | ICD-10-CM

## 2021-10-11 DIAGNOSIS — Z872 Personal history of diseases of the skin and subcutaneous tissue: Secondary | ICD-10-CM | POA: Diagnosis not present

## 2021-10-11 DIAGNOSIS — N132 Hydronephrosis with renal and ureteral calculous obstruction: Secondary | ICD-10-CM

## 2021-10-11 DIAGNOSIS — M19042 Primary osteoarthritis, left hand: Secondary | ICD-10-CM

## 2021-10-11 DIAGNOSIS — Z8719 Personal history of other diseases of the digestive system: Secondary | ICD-10-CM

## 2021-10-11 MED ORDER — DENOSUMAB 60 MG/ML ~~LOC~~ SOSY
60.0000 mg | PREFILLED_SYRINGE | Freq: Once | SUBCUTANEOUS | Status: AC
  Administered 2021-10-11: 60 mg via SUBCUTANEOUS

## 2021-10-11 NOTE — Progress Notes (Signed)
Pharmacy Note  Subjective:   Patient presents to clinic today to receive bi-annual dose of Prolia.  Patient running a fever or have signs/symptoms of infection? No  Patient currently on antibiotics for the treatment of infection? No  Patient had fall in the last 6 months?  No  If yes, did it require medical attention? No   Patient taking calcium 1200 mg daily through diet or supplement and at least 800 units vitamin D? Yes  Objective: CMP     Component Value Date/Time   NA 143 09/20/2021 1357   NA 137 02/02/2021 1640   K 4.7 09/20/2021 1357   CL 109 09/20/2021 1357   CO2 27 09/20/2021 1357   GLUCOSE 110 (H) 09/20/2021 1357   BUN 17 09/20/2021 1357   BUN 16 02/02/2021 1640   CREATININE 0.97 09/20/2021 1357   CALCIUM 9.2 09/20/2021 1357   PROT 6.2 09/20/2021 1357   PROT 6.0 08/05/2019 1340   ALBUMIN 3.9 08/05/2019 1340   AST 14 09/20/2021 1357   ALT 15 09/20/2021 1357   ALKPHOS 72 08/05/2019 1340   BILITOT 0.5 09/20/2021 1357   BILITOT 0.5 08/05/2019 1340   GFRNONAA 67 03/17/2021 1059   GFRAA 77 03/17/2021 1059    CBC    Component Value Date/Time   WBC 11.2 (H) 09/20/2021 1357   RBC 3.77 (L) 09/20/2021 1357   HGB 12.6 (L) 09/20/2021 1357   HGB 12.1 (L) 02/02/2021 1640   HCT 37.6 (L) 09/20/2021 1357   HCT 35.4 (L) 02/02/2021 1640   PLT 402 (H) 09/20/2021 1357   PLT 364 02/02/2021 1640   MCV 99.7 09/20/2021 1357   MCV 95 02/02/2021 1640   MCH 33.4 (H) 09/20/2021 1357   MCHC 33.5 09/20/2021 1357   RDW 12.1 09/20/2021 1357   RDW 12.4 02/02/2021 1640   LYMPHSABS 2,285 09/20/2021 1357   LYMPHSABS 2.4 08/22/2018 1202   MONOABS 944 03/01/2016 1214   EOSABS 1,086 (H) 09/20/2021 1357   EOSABS 0.6 (H) 08/22/2018 1202   BASOSABS 67 09/20/2021 1357   BASOSABS 0.1 08/22/2018 1202    Lab Results  Component Value Date   VD25OH 47 03/17/2021    T-score: -2.8  Administrations This Visit     denosumab (PROLIA) injection 60 mg     Admin Date 10/11/2021  Action Given Dose 60 mg Route Subcutaneous Administered By Carole Binning, LPN            Assessment/Plan:   Patient tolerated injection well.    All questions encouraged and answered.  Instructed patient to call with any further questions or concerns.
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
## 2021-10-12 DIAGNOSIS — C44319 Basal cell carcinoma of skin of other parts of face: Secondary | ICD-10-CM | POA: Diagnosis not present

## 2021-10-28 ENCOUNTER — Telehealth: Payer: Self-pay

## 2021-10-28 NOTE — Telephone Encounter (Signed)
Patient calls nurse line reporting wound infection on LE. Patient reports he has been dealing with this since "Halloween." Patient reports the area is not necessarily getting worse, however is it not getting better. Patient does report mild pain and redness to the area. Patient denies drainage, foul odor or fever/chills.    Patient is requesting to be referred to wound care. Patient scheduled for tomorrow for evaluation.   Precautions given in the meantime.

## 2021-10-29 ENCOUNTER — Other Ambulatory Visit: Payer: Self-pay

## 2021-10-29 ENCOUNTER — Ambulatory Visit (INDEPENDENT_AMBULATORY_CARE_PROVIDER_SITE_OTHER): Payer: Medicare Other | Admitting: Family Medicine

## 2021-10-29 VITALS — BP 147/77 | HR 59 | Wt 145.0 lb

## 2021-10-29 DIAGNOSIS — I83028 Varicose veins of left lower extremity with ulcer other part of lower leg: Secondary | ICD-10-CM

## 2021-10-29 DIAGNOSIS — L97829 Non-pressure chronic ulcer of other part of left lower leg with unspecified severity: Secondary | ICD-10-CM

## 2021-10-29 MED ORDER — DOXYCYCLINE HYCLATE 100 MG PO TABS: 100.0000 mg | ORAL_TABLET | Freq: Two times a day (BID) | ORAL | 0 refills | Status: AC

## 2021-10-29 NOTE — Patient Instructions (Addendum)
It was great seeing you today!  Today we discussed your wound, this seems to be due to less blood flow that is causing this wound. Due to continued drainage, I will start you on an antibiotic called doxycycline, please take this twice daily for 7 days. Make sure to complete the course.   I have also placed a referral to the wound care center, they will see you weekly for dressing changes. Make sure to elevate your legs when you can.  We will also get an ultrasound to make sure your arteries are fine. The address is [address] in [place].   Please follow up at your next scheduled appointment in 1 week, if anything arises between now and then, please don't hesitate to contact our office.   Thank you for allowing [place] to be a part of your medical care!  Thank you, Dr. Larae Grooms

## 2021-10-30 DIAGNOSIS — S81809D Unspecified open wound, unspecified lower leg, subsequent encounter: Secondary | ICD-10-CM | POA: Insufficient documentation

## 2021-10-30 DIAGNOSIS — I83009 Varicose veins of unspecified lower extremity with ulcer of unspecified site: Secondary | ICD-10-CM | POA: Insufficient documentation

## 2021-10-30 NOTE — Progress Notes (Signed)
° ° °  SUBJECTIVE:   CHIEF COMPLAINT / HPI:   Patient presents with wound on his left leg that first developed on 10/31. It started out as a slit and then ended up looking as it does now. Denies any worsening symptoms but is here today as it has not resolved since then. Denies foul odor, fever or chill. Endorsing occasional drainage that had also been present more recently. He was seen recently by his dermatologist who recommended continued compression with bandage which he had been doing since then.   OBJECTIVE:   BP (!) 147/77    Pulse (!) 59    Wt 145 lb (65.8 kg)    SpO2 100%    BMI 21.41 kg/m   General: Patient well-appearing, in no acute distress. CV: RRR, no murmurs or gallops auscultated Resp: CTAB Ext: distal pulses present bilaterally, ulcer noted along left LE with surrounding erythema without active drainage    ASSESSMENT/PLAN:   Venous stasis ulcer ([place]) -likely secondary to venous insufficiency, does not appear to be a cellulitis or decubitus ulcer and reassuringly patient does not demonstrate any systemic symptoms but will prescribe 7 day course of doxycycline due to ongoing drainage -unna boot placed, instructed to change weekly, wound care consult placed  -in order to ensure appropriate circulation, arterial US ordered  -follow up in 1 week to ensure appropriate healing      Dinara Lupu Larae Grooms, [place]

## 2021-10-30 NOTE — Assessment & Plan Note (Addendum)
-  likely secondary to venous insufficiency, does not appear to be a cellulitis or decubitus ulcer and reassuringly patient does not demonstrate any systemic symptoms but will prescribe 7 day course of doxycycline due to ongoing drainage -unna boot placed, instructed to change weekly, wound care consult placed  -in order to ensure appropriate circulation, arterial US ordered  -follow up in 1 week to ensure appropriate healing

## 2021-11-02 ENCOUNTER — Ambulatory Visit (INDEPENDENT_AMBULATORY_CARE_PROVIDER_SITE_OTHER): Payer: Medicare Other | Admitting: Cardiology

## 2021-11-02 ENCOUNTER — Other Ambulatory Visit: Payer: Self-pay

## 2021-11-02 ENCOUNTER — Encounter: Payer: Self-pay | Admitting: Cardiology

## 2021-11-02 ENCOUNTER — Ambulatory Visit
Admission: RE | Admit: 2021-11-02 | Discharge: 2021-11-02 | Disposition: A | Discharge status: Home / Self Care | Payer: Medicare Other | Source: Ambulatory Visit | Attending: Family Medicine | Admitting: Family Medicine

## 2021-11-02 VITALS — BP 150/82 | HR 66 | Ht 70.0 in | Wt 146.0 lb

## 2021-11-02 DIAGNOSIS — I7121 Aneurysm of the ascending aorta, without rupture: Secondary | ICD-10-CM | POA: Diagnosis not present

## 2021-11-02 DIAGNOSIS — I83028 Varicose veins of left lower extremity with ulcer other part of lower leg: Secondary | ICD-10-CM

## 2021-11-02 DIAGNOSIS — E7800 Pure hypercholesterolemia, unspecified: Secondary | ICD-10-CM | POA: Diagnosis not present

## 2021-11-02 DIAGNOSIS — L97909 Non-pressure chronic ulcer of unspecified part of unspecified lower leg with unspecified severity: Secondary | ICD-10-CM | POA: Diagnosis not present

## 2021-11-02 DIAGNOSIS — I10 Essential (primary) hypertension: Secondary | ICD-10-CM

## 2021-11-02 DIAGNOSIS — R55 Syncope and collapse: Secondary | ICD-10-CM

## 2021-11-02 DIAGNOSIS — I998 Other disorder of circulatory system: Secondary | ICD-10-CM | POA: Diagnosis not present

## 2021-11-02 DIAGNOSIS — L97829 Non-pressure chronic ulcer of other part of left lower leg with unspecified severity: Secondary | ICD-10-CM

## 2021-11-02 NOTE — Patient Instructions (Signed)
Medication Instructions:  Your physician recommends that you continue on your current medications as directed. Please refer to the Current Medication list given to you today.  *If you need a refill on your cardiac medications before your next appointment, please call your pharmacy*   Lab Work: Your physician recommends that you return for lab work in: Labs today: BMP, Lipid If you have labs (blood work) drawn today and your tests are completely normal, you will receive your results only by: [place] (if you have [place]) OR A paper copy in the mail If you have any lab test that is abnormal or we need to change your treatment, we will call you to review the results.   Testing/Procedures: None   Follow-Up: At [facility], you and your health needs are our priority.  As part of our continuing mission to provide you with exceptional heart care, we have created designated Provider Care Teams.  These Care Teams include your primary Cardiologist (physician) and Advanced Practice Providers (APPs -  Physician Assistants and Nurse Practitioners) who all work together to provide you with the care you need, when you need it.  We recommend signing up for the patient portal called "MyChart".  Sign up information is provided on this After Visit Summary.  MyChart is used to connect with patients for Virtual Visits (Telemedicine).  Patients are able to view lab/test results, encounter notes, upcoming appointments, etc.  Non-urgent messages can be sent to your provider as well.   To learn more about what you can do with MyChart, go to [email or web address].    Your next appointment:   6 month(s)  The format for your next appointment:   In Person  Provider:   Jenne Campus, MD    Other Instructions None
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
## 2021-11-03 LAB — BASIC METABOLIC PANEL
BUN/Creatinine Ratio: 17 (ref 10–24)
BUN: 16 mg/dL (ref 8–27)
CO2: 22 mmol/L (ref 20–29)
Calcium: 9.2 mg/dL (ref 8.6–10.2)
Chloride: 107 mmol/L — ABNORMAL HIGH (ref 96–106)
Creatinine, Ser: 0.93 mg/dL (ref 0.76–1.27)
Glucose: 79 mg/dL (ref 70–99)
Potassium: 4.1 mmol/L (ref 3.5–5.2)
Sodium: 141 mmol/L (ref 134–144)
eGFR: 85 mL/min/{1.73_m2} (ref >59)

## 2021-11-03 LAB — LIPID PANEL
Chol/HDL Ratio: 3.9 ratio (ref 0.0–5.0)
Cholesterol, Total: 192 mg/dL (ref 100–199)
HDL: 49 mg/dL (ref >39)
LDL Chol Calc (NIH): 129 mg/dL — ABNORMAL HIGH (ref 0–99)
Triglycerides: 78 mg/dL (ref 0–149)
VLDL Cholesterol Cal: 14 mg/dL (ref 5–40)

## 2021-11-04 NOTE — Progress Notes (Signed)
° ° °  SUBJECTIVE:   CHIEF COMPLAINT / HPI: leg wound  Left Leg wound: Patient had unna boot placed one week ago on left leg. Wound initially developed around halloween after small trauma of patient running into an object. The wound did not heal between October and January. Patient does not have a history of venous stasis, no h/o b/l peripheral edema. Patient has been on doxycycline since 10/29/21 and reports completing the course. He was referred to wound care at the same time and has an appt on 11/11/21. He had an arterial doppler study that revealed "no evidence of hemodynamically significant peripheral arterial disease in either lower extremity" on 11/02/21. He denies fever, chills, redness spreading. He notes that the wrap has been very uncomfortable and tight.  PERTINENT  PMH / PSH: HTN, prurigo nodularis  OBJECTIVE:   BP (!) 151/76    Pulse 60    Ht 5\' 10"  (1.778 m)    Wt 145 lb 9.6 oz (66 kg)    SpO2 100%    BMI 20.89 kg/m   Nursing note and vitals reviewed GEN: age-appropriate, WM, resting comfortably in chair, NAD, thin appearing Cardiac: Regular rate and rhythm. Normal S1/S2. No murmurs, rubs, or gallops appreciated. 2+ radial pulses. Neuro: AOx3  Ext: no edema on right leg, wound with clean edges and granulomatous tissue, no pustulant drainage, no spreading erythema, swelling about 2" above wound where unna wrap was. 2+ DP. Psych: Pleasant and appropriate   ASSESSMENT/PLAN:   Non-healing wound of lower extremity, subsequent encounter Wound is clean and has appropriate growth around outer edges, no signs of infection. Unclear etiology, patient has had non-healing wounds in the past. However, he does not have the usual skin changes expected with venous stasis, no history of CHF or leg swelling, he does have a history of hypertension. Instead of unna boot, will place xeroform gauze with telfa dressing and coban, loose to prevent constriction. 2+ DP.  Patient to follow up with wound care on  11/11/21, follow up at [facility] in 2-4 weeks to ensure continued resolution. If no improvement can consider underling immunosuppression such as DM, HIV or venous stasis.     Gladys Damme, MD [place]

## 2021-11-05 ENCOUNTER — Other Ambulatory Visit: Payer: Self-pay

## 2021-11-05 ENCOUNTER — Ambulatory Visit (INDEPENDENT_AMBULATORY_CARE_PROVIDER_SITE_OTHER): Payer: Medicare Other | Admitting: Family Medicine

## 2021-11-05 DIAGNOSIS — S81809D Unspecified open wound, unspecified lower leg, subsequent encounter: Secondary | ICD-10-CM

## 2021-11-05 NOTE — Patient Instructions (Signed)
It was a pleasure to see you today!  For your leg: I have placed a xeroform dressing with non-adherent dressing. Please follow up with wound care next week. I recommend you return for evaluation in 2 weeks to ensure that your leg is improving. If you have redness spreading up your leg, fevers (temp above 100.4*F), chills, drainage that looks like pus, please call our office to make a same day appointment, or if we are closed, go to urgent care/emergency department   Be Well,  Dr. Chauncey Reading

## 2021-11-07 NOTE — Assessment & Plan Note (Addendum)
Wound is clean and has appropriate growth around outer edges, no signs of infection. Unclear etiology, patient has had non-healing wounds in the past. However, he does not have the usual skin changes expected with venous stasis, no history of CHF or leg swelling, he does have a history of hypertension. Instead of unna boot, will place xeroform gauze with telfa dressing and coban, loose to prevent constriction. 2+ DP.  Patient to follow up with wound care on 11/11/21, follow up at [facility] in 2-4 weeks to ensure continued resolution. If no improvement can consider underling immunosuppression such as DM, HIV or venous stasis.

## 2021-11-10 ENCOUNTER — Telehealth: Payer: Self-pay | Admitting: Cardiology

## 2021-11-10 ENCOUNTER — Other Ambulatory Visit: Payer: Self-pay

## 2021-11-10 MED ORDER — PREDNISONE 1 MG PO TABS
3.0000 mg | ORAL_TABLET | Freq: Every day | ORAL | 0 refills | Status: DC
Start: 2021-11-10 — End: 2022-02-18

## 2021-11-10 NOTE — Telephone Encounter (Signed)
Refill for Prednisone was sent on 09/14/2021 for a 90 day supply. Patient filled it at the pharmacy on 09/14/2021 and he received 270 tablets. Patient states he does not have enough to last until next month. Patient states he is only taking 3 tablets daily. Patient states he is going to look through his medications and call back to see if he can find the remaing prednisone.

## 2021-11-10 NOTE — Telephone Encounter (Signed)
Patient called requesting prescription refill of Prednisone to be sent to [facility] at [address]

## 2021-11-10 NOTE — Telephone Encounter (Signed)
Patient returned call to the office saying he does not have enough prednisone but for the next 6 days. Patient is unsure where the remaining Prednisone is and is requesting a refill.   Next Visit: Due July 2023. Message sent to the front to schedule patient.   Last Visit: 10/11/2021  Last Fill:09/14/2021  Dx: Polymyalgia rheumatica   Current Dose per office note on 10/11/2021: prednisone 3 mg daily   Okay to refill Prednisone?

## 2021-11-10 NOTE — Telephone Encounter (Signed)
I reviewed Taylor's note.  Patient stated that he is unable to taper prednisone below 3 mg p.o. daily.  He may decrease prednisone by 1 mg every 2 months if tolerated.  I will send the prescription for prednisone 3 mg p.o. daily for now.

## 2021-11-10 NOTE — Telephone Encounter (Signed)
Patient returned call for his lab results.  

## 2021-11-10 NOTE — Telephone Encounter (Signed)
Patient advised he may decrease prednisone by 1 mg every 2 months if tolerated.  Patient advised prescription sent for prednisone 3 mg p.o. daily for now.

## 2021-11-10 NOTE — Telephone Encounter (Signed)
Please schedule patient for a follow up visit. Patient due July 2023. Thanks!

## 2021-11-10 NOTE — Progress Notes (Addendum)
JAKE, FUHRMANN ([id number]) Visit Report for 11/11/2021 Allergy List Details Patient Name: Date of Service: Brian Leon, [name] 11/11/2021 7:30 A M Medical Record Number: [id number] Patient Account Number: [id number] Date of Birth/Sex: Treating RN: July 29, 1946 (75 y.o. Male) [name] Primary Care Charnelle Bergeman: Brian Leon Other Clinician: Referring Ayanni Tun: Treating Sumayyah Custodio/Extender: Jefm Miles Weeks in Treatment: 0 Allergies Active Allergies Sulfa (Sulfonamide Antibiotics) acetaminophen Allergy Notes Electronic Signature(s) Signed: 11/10/2021 3:06:44 PM By: Valeria Batman EMT Entered By: Valeria Batman on 11/10/2021 15:06:44 -------------------------------------------------------------------------------- Arrival Information Details Patient Name: Date of Service: Brian Leon, RO NN P. 11/11/2021 7:30 A M Medical Record Number: [id number] Patient Account Number: [id number] Date of Birth/Sex: Treating RN: 03/29/1946 (75 y.o. Male) Brian Leon Primary Care Brean Carberry: Brian Leon Other Clinician: Referring Daric Koren: Treating Albertine Lafoy/Extender: Brian Leon in Treatment: 0 Visit Information Patient Arrived: Ambulatory Arrival Time: 07:54 Accompanied By: self Transfer Assistance: None Patient Identification Verified: Yes Secondary Verification Process Completed: Yes Patient Requires Transmission-Based Precautions: No Patient Has Alerts: Yes Patient Alerts: 11/02/21 L ABI: 1.37 11/02/2021 L TBI 0.77 Notes Per patient has seen three providers concerning this wound- dermtaology, PCP, and vascular. Electronic Signature(s) Signed: 11/11/2021 5:47:56 PM By: Brian Pilling RN, BSN Entered By: Brian Leon on 11/11/2021 08:22:14 -------------------------------------------------------------------------------- Clinic Level of Care Assessment Details Patient Name: Date of Service: Brian Leon, [name] 11/11/2021 7:30 A M Medical  Record Number: [id number] Patient Account Number: [id number] Date of Birth/Sex: Treating RN: 05/12/1946 (75 y.o. Male) Brian Leon Primary Care Brian Leon: Brian Leon Other Clinician: Referring Brian Leon: Treating Ronniesha Seibold/Extender: Brian Leon in Treatment: 0 Clinic Level of Care Assessment Items TOOL 1 Quantity Score X- 1 0 Use when EandM and Procedure is performed on INITIAL visit ASSESSMENTS - Nursing Assessment / Reassessment X- 1 20 General Physical Exam (combine w/ comprehensive assessment (listed just below) when performed on new pt. evals) X- 1 25 Comprehensive Assessment (HX, ROS, Risk Assessments, Wounds Hx, etc.) ASSESSMENTS - Wound and Skin Assessment / Reassessment X- 1 10 Dermatologic / Skin Assessment (not related to wound area) ASSESSMENTS - Ostomy and/or Continence Assessment and Care '[]'  - 0 Incontinence Assessment and Management '[]'  - 0 Ostomy Care Assessment and Management (repouching, etc.) PROCESS - Coordination of Care X - Simple Patient / Family Education for ongoing care 1 15 '[]'  - 0 Complex (extensive) Patient / Family Education for ongoing care X- 1 10 Staff obtains Programmer, systems, Records, T Results / Process Orders est '[]'  - 0 Staff telephones HHA, Nursing Homes / Clarify orders / etc '[]'  - 0 Routine Transfer to another Facility (non-emergent condition) '[]'  - 0 Routine Hospital Admission (non-emergent condition) X- 1 15 New Admissions / Biomedical engineer / Ordering NPWT Apligraf, etc. , '[]'  - 0 Emergency Hospital Admission (emergent condition) PROCESS - Special Needs '[]'  - 0 Pediatric / Minor Patient Management '[]'  - 0 Isolation Patient Management '[]'  - 0 Hearing / Language / Visual special needs '[]'  - 0 Assessment of Community assistance (transportation, D/C planning, etc.) '[]'  - 0 Additional assistance / Altered mentation '[]'  - 0 Support Surface(s) Assessment (bed, cushion, seat, etc.) INTERVENTIONS -  Miscellaneous '[]'  - 0 External ear exam '[]'  - 0 Patient Transfer (multiple staff / Civil Service fast streamer / Similar devices) '[]'  - 0 Simple Staple / Suture removal (25 or less) '[]'  - 0 Complex Staple / Suture removal (26 or more) '[]'  - 0 Hypo/Hyperglycemic Management (do not check if billed separately) '[]'  -  0 Ankle / Brachial Index (ABI) - do not check if billed separately Has the patient been seen at the hospital within the last three years: Yes Total Score: 95 Level Of Care: New/Established - Level 3 Electronic Signature(s) Signed: 11/11/2021 5:47:56 PM By: Brian Pilling RN, BSN Signed: 11/11/2021 5:47:56 PM By: Brian Pilling RN, BSN Entered By: Brian Leon on 11/11/2021 08:53:05 -------------------------------------------------------------------------------- Encounter Discharge Information Details Patient Name: Date of Service: Brian Leon, RO NN P. 11/11/2021 7:30 A M Medical Record Number: [id number] Patient Account Number: [id number] Date of Birth/Sex: Treating RN: May 15, 1946 (75 y.o. Male) Brian Leon Primary Care Pat Elicker: Brian Leon Other Clinician: Referring Brian Leon: Treating Brian Leon/Extender: Brian Leon in Treatment: 0 Encounter Discharge Information Items Post Procedure Vitals Discharge Condition: Stable Temperature (F): 98.4 Ambulatory Status: Ambulatory Pulse (bpm): 69 Discharge Destination: Home Respiratory Rate (breaths/min): 20 Transportation: Other Blood Pressure (mmHg): 149/81 Accompanied By: self Schedule Follow-up Appointment: Yes Clinical Summary of Care: Electronic Signature(s) Signed: 11/11/2021 5:47:56 PM By: Brian Pilling RN, BSN Entered By: Brian Leon on 11/11/2021 08:54:16 -------------------------------------------------------------------------------- Lower Extremity Assessment Details Patient Name: Date of Service: Brian Leon, RO NN P. 11/11/2021 7:30 A M Medical Record Number: [id number] Patient Account Number:  [id number] Date of Birth/Sex: Treating RN: 1946/03/30 (75 y.o. Male) Brian Leon Primary Care Brian Leon: Brian Leon Other Clinician: Referring Gaelle Adriance: Treating Etheridge Geil/Extender: Jefm Miles Weeks in Treatment: 0 Edema Assessment Assessed: [Left: Yes] [Right: No] Edema: [Left: N] [Right: o] Calf Left: Right: Point of Measurement: 37 cm From Medial Instep 31.5 cm Ankle Left: Right: Point of Measurement: 12 cm From Medial Instep 19 cm Knee To Floor Left: Right: From Medial Instep 47 cm Vascular Assessment Pulses: Dorsalis Pedis Palpable: [Left:Yes] Doppler Audible: [Left:Yes] Posterior Tibial Palpable: [Left:Yes Yes] Electronic Signature(s) Signed: 11/11/2021 5:47:56 PM By: Brian Pilling RN, BSN Entered By: Brian Leon on 11/11/2021 08:12:13 -------------------------------------------------------------------------------- Multi Wound Chart Details Patient Name: Date of Service: Brian Leon, RO NN P. 11/11/2021 7:30 A M Medical Record Number: [id number] Patient Account Number: [id number] Date of Birth/Sex: Treating RN: 12-14-1945 (75 y.o. Male) [name] Primary Care Khayla Koppenhaver: Brian Leon Other Clinician: Referring Adib Wahba: Treating Atharva Mirsky/Extender: Jefm Miles Weeks in Treatment: 0 Vital Signs Height(in): 68 Pulse(bpm): 69 Weight(lbs): 140 Blood Pressure(mmHg): 149/81 Body Mass Index(BMI): 21.3 Temperature(F): 98.4 Respiratory Rate(breaths/min): 20 Photos: [N/A:N/A] Left, Medial Lower Leg N/A N/A Wound Location: Trauma N/A N/A Wounding Event: Abrasion N/A N/A Primary Etiology: Venous Leg Ulcer N/A N/A Secondary Etiology: Asthma, Hypertension, Hepatitis A, N/A N/A Comorbid History: Rheumatoid Arthritis 08/02/2021 N/A N/A Date Acquired: 0 N/A N/A Weeks of Treatment: Open N/A N/A Wound Status: No N/A N/A Wound Recurrence: 2.9x1.3x0.1 N/A N/A Measurements L x W x D (cm) 2.961 N/A  N/A A (cm) : rea 0.296 N/A N/A Volume (cm) : 0.00% N/A N/A % Reduction in A rea: 0.00% N/A N/A % Reduction in Volume: Full Thickness Without Exposed N/A N/A Classification: Support Structures Medium N/A N/A Exudate A mount: Serosanguineous N/A N/A Exudate Type: red, brown N/A N/A Exudate Color: Distinct, outline attached N/A N/A Wound Margin: Medium (34-66%) N/A N/A Granulation A mount: Red, Pink, Hyper-granulation N/A N/A Granulation Quality: Medium (34-66%) N/A N/A Necrotic A mount: Fat Layer (Subcutaneous Tissue): Yes N/A N/A Exposed Structures: Fascia: No Tendon: No Muscle: No Joint: No Bone: No Small (1-33%) N/A N/A Epithelialization: Debridement - Excisional N/A N/A Debridement: Pre-procedure Verification/Time Out 08:40 N/A N/A Taken: Lidocaine 5% topical ointment N/A N/A Pain Control:  Subcutaneous, Slough N/A N/A Tissue Debrided: Skin/Subcutaneous Tissue N/A N/A Level: 3.77 N/A N/A Debridement A (sq cm): rea Curette N/A N/A Instrument: Minimum N/A N/A Bleeding: Pressure N/A N/A Hemostasis A chieved: 1 N/A N/A Procedural Pain: 3 N/A N/A Post Procedural Pain: Procedure was tolerated well N/A N/A Debridement Treatment Response: 2.9x1.3x0.1 N/A N/A Post Debridement Measurements L x W x D (cm) 0.296 N/A N/A Post Debridement Volume: (cm) Debridement N/A N/A Procedures Performed: Treatment Notes Wound #1 (Lower Leg) Wound Laterality: Left, Medial Cleanser Soap and Water Discharge Instruction: May shower and wash wound with dial antibacterial soap and water prior to dressing change. Byram Ancillary Kit - 15 Day Supply Discharge Instruction: Use supplies as instructed; Kit contains: (15) Saline Bullets; (15) 3x3 Gauze; 15 pr Gloves Peri-Wound Care Skin Prep Discharge Instruction: Use skin prep as directed Topical Gentamicin Discharge Instruction: Apply directly to wound bed. Primary Dressing Hydrofera Blue Classic Foam, 2x2  in Discharge Instruction: Moisten with saline. Apply over the gentamicin ointment. Secondary Dressing Zetuvit Plus Silicone Border Dressing 4x4 (in/in) Discharge Instruction: Apply silicone border over primary dressing as directed. Secured With Compression Wrap Compression Stockings Add-Ons Electronic Signature(s) Signed: 11/11/2021 10:17:06 AM By: Kalman Shan DO Signed: 11/11/2021 6:05:40 PM By: [name] RN, BSN Entered By: Kalman Shan on 11/11/2021 09:32:57 -------------------------------------------------------------------------------- Multi-Disciplinary Care Plan Details Patient Name: Date of Service: Brian Leon, RO NN P. 11/11/2021 7:30 A M Medical Record Number: [id number] Patient Account Number: [id number] Date of Birth/Sex: Treating RN: 1946/05/09 (75 y.o. Male) Brian Leon Primary Care Nijel Flink: Brian Leon Other Clinician: Referring Earma Nicolaou: Treating Rosell Khouri/Extender: Brian Leon in Treatment: 0 Active Inactive Orientation to the Wound Care Program Nursing Diagnoses: Knowledge deficit related to the wound healing center program Goals: Patient/caregiver will verbalize understanding of the [facility] Date Initiated: 11/11/2021 Target Resolution Date: 12/10/2021 Goal Status: Active Interventions: Provide education on orientation to the wound center Notes: Pain, Acute or Chronic Nursing Diagnoses: Pain, acute or chronic: actual or potential Potential alteration in comfort, pain Goals: Patient will verbalize adequate pain control and receive pain control interventions during procedures as needed Date Initiated: 11/11/2021 Target Resolution Date: 12/10/2021 Goal Status: Active Patient/caregiver will verbalize comfort level met Date Initiated: 11/11/2021 Target Resolution Date: 12/09/2021 Goal Status: Active Interventions: Encourage patient to take pain medications as prescribed Provide education on pain  management Treatment Activities: Administer pain control measures as ordered : 11/11/2021 Notes: Wound/Skin Impairment Nursing Diagnoses: Knowledge deficit related to ulceration/compromised skin integrity Goals: Patient/caregiver will verbalize understanding of skin care regimen Date Initiated: 11/11/2021 Target Resolution Date: 12/10/2021 Goal Status: Active Interventions: Assess patient/caregiver ability to obtain necessary supplies Assess patient/caregiver ability to perform ulcer/skin care regimen upon admission and as needed Provide education on ulcer and skin care Treatment Activities: Skin care regimen initiated : 11/11/2021 Topical wound management initiated : 11/11/2021 Notes: Electronic Signature(s) Signed: 11/11/2021 5:47:56 PM By: Brian Pilling RN, BSN Entered By: Brian Leon on 11/11/2021 08:25:13 -------------------------------------------------------------------------------- Pain Assessment Details Patient Name: Date of Service: Brian Leon, RO NN P. 11/11/2021 7:30 A M Medical Record Number: [id number] Patient Account Number: [id number] Date of Birth/Sex: Treating RN: 08-29-1946 (75 y.o. Male) Brian Leon Primary Care Kazden Largo: Brian Leon Other Clinician: Referring Deneane Stifter: Treating Rahm Minix/Extender: Jefm Miles Weeks in Treatment: 0 Active Problems Location of Pain Severity and Description of Pain Patient Has Paino Yes Site Locations Pain Location: Pain in Ulcers Rate the pain. Current Pain Level: 3 Worst Pain Level: 10 Least  Pain Level: 0 Tolerable Pain Level: 7 Pain Management and Medication Current Pain Management: Medication: Yes Cold Application: No Rest: Yes Massage: No Activity: No T.E.N.S.: No Heat Application: No Leg drop or elevation: No Is the Current Pain Management Adequate: Adequate How does your wound impact your activities of daily livingo Sleep: No Bathing: No Appetite: No Relationship With Others:  No Bladder Continence: No Emotions: No Bowel Continence: No Work: No Toileting: No Drive: No Dressing: No Hobbies: Astronomer) Signed: 11/11/2021 5:47:56 PM By: Brian Pilling RN, BSN Entered By: Brian Leon on 11/11/2021 08:05:06 -------------------------------------------------------------------------------- Patient/Caregiver Education Details Patient Name: Date of Service: Brian Leon, RO NN P. 2/9/2023andnbsp7:30 A M Medical Record Number: [id number] Patient Account Number: [id number] Date of Birth/Gender: Treating RN: 10/08/1945 (75 y.o. Male) Brian Leon Primary Care Physician: Brian Leon Other Clinician: Referring Physician: Treating Physician/Extender: Brian Leon in Treatment: 0 Education Assessment Education Provided To: Patient Education Topics Provided Pain: Handouts: A Guide to Pain Control Methods: Explain/Verbal, Printed Responses: Reinforcements needed [place]: o Handouts: Welcome T [facility] [place] o Methods: Explain/Verbal, Printed Responses: Reinforcements needed Wound/Skin Impairment: Handouts: Caring for Your Ulcer, Skin Care Do's and Dont's Methods: Explain/Verbal, Printed Responses: Reinforcements needed Electronic Signature(s) Signed: 11/11/2021 5:47:56 PM By: Brian Pilling RN, BSN Entered By: Brian Leon on 11/11/2021 08:26:32 -------------------------------------------------------------------------------- Wound Assessment Details Patient Name: Date of Service: Brian Leon, RO NN P. 11/11/2021 7:30 A M Medical Record Number: [id number] Patient Account Number: [id number] Date of Birth/Sex: Treating RN: 1945-12-28 (76 y.o. Male) Brian Leon Primary Care Kadarius Cuffe: Brian Leon Other Clinician: Referring Janilah Hojnacki: Treating Happy Begeman/Extender: Jefm Miles Weeks in Treatment: 0 Wound Status Wound Number: 1 Primary Etiology:  Abrasion Wound Location: Left, Medial Lower Leg Secondary Etiology: Venous Leg Ulcer Wounding Event: Trauma Wound Status: Open Date Acquired: 08/02/2021 Comorbid History: Asthma, Hypertension, Hepatitis A, Rheumatoid Arthritis Weeks Of Treatment: 0 Clustered Wound: No Photos Wound Measurements Length: (cm) 2.9 Width: (cm) 1.3 Depth: (cm) 0.1 Area: (cm) 2.961 Volume: (cm) 0.296 % Reduction in Area: 0% % Reduction in Volume: 0% Epithelialization: Small (1-33%) Tunneling: No Undermining: No Wound Description Classification: Full Thickness Without Exposed Support Structures Wound Margin: Distinct, outline attached Exudate Amount: Medium Exudate Type: Serosanguineous Exudate Color: red, brown Foul Odor After Cleansing: No Slough/Fibrino Yes Wound Bed Granulation Amount: Medium (34-66%) Exposed Structure Granulation Quality: Red, Pink, Hyper-granulation Fascia Exposed: No Necrotic Amount: Medium (34-66%) Fat Layer (Subcutaneous Tissue) Exposed: Yes Necrotic Quality: Adherent Slough Tendon Exposed: No Muscle Exposed: No Joint Exposed: No Bone Exposed: No Treatment Notes Wound #1 (Lower Leg) Wound Laterality: Left, Medial Cleanser Soap and Water Discharge Instruction: May shower and wash wound with dial antibacterial soap and water prior to dressing change. Byram Ancillary Kit - 15 Day Supply Discharge Instruction: Use supplies as instructed; Kit contains: (15) Saline Bullets; (15) 3x3 Gauze; 15 pr Gloves Peri-Wound Care Skin Prep Discharge Instruction: Use skin prep as directed Topical Gentamicin Discharge Instruction: Apply directly to wound bed. Primary Dressing Hydrofera Blue Classic Foam, 2x2 in Discharge Instruction: Moisten with saline. Apply over the gentamicin ointment. Secondary Dressing Zetuvit Plus Silicone Border Dressing 4x4 (in/in) Discharge Instruction: Apply silicone border over primary dressing as directed. Secured With Compression  Wrap Compression Stockings Environmental education officer) Signed: 11/11/2021 5:47:56 PM By: Brian Pilling RN, BSN Entered By: Brian Leon on 11/11/2021 08:15:41 -------------------------------------------------------------------------------- Vitals Details Patient Name: Date of Service: Brian Leon, RO NN P. 11/11/2021 7:30 A  M Medical Record Number: [id number] Patient Account Number: [id number] Date of Birth/Sex: Treating RN: [birth year] (76 y.o. Male) [name], [place] Primary Care Maram Bently: Brian Leon Other Clinician: Referring Justn Quale: Treating Zoa Dowty/Extender: Brian Leon in Treatment: 0 Vital Signs Time Taken: 07:53 Temperature (F): 98.4 Height (in): 68 Pulse (bpm): 69 Source: Stated Respiratory Rate (breaths/min): 20 Weight (lbs): 140 Blood Pressure (mmHg): 149/81 Source: Stated Reference Range: 80 - 120 mg / dl Body Mass Index (BMI): 21.3 Electronic Signature(s) Signed: 11/11/2021 5:47:56 PM By: Brian Pilling RN, BSN Entered By: Brian Leon on 11/11/2021 07:57:11

## 2021-11-10 NOTE — Telephone Encounter (Signed)
Called and left message for patient to call back.

## 2021-11-11 ENCOUNTER — Encounter (HOSPITAL_BASED_OUTPATIENT_CLINIC_OR_DEPARTMENT_OTHER): Payer: Medicare Other | Attending: Internal Medicine | Admitting: Internal Medicine

## 2021-11-11 DIAGNOSIS — Z87891 Personal history of nicotine dependence: Secondary | ICD-10-CM | POA: Diagnosis not present

## 2021-11-11 DIAGNOSIS — M353 Polymyalgia rheumatica: Secondary | ICD-10-CM | POA: Diagnosis not present

## 2021-11-11 DIAGNOSIS — T1490XA Injury, unspecified, initial encounter: Secondary | ICD-10-CM | POA: Diagnosis not present

## 2021-11-11 DIAGNOSIS — L97822 Non-pressure chronic ulcer of other part of left lower leg with fat layer exposed: Secondary | ICD-10-CM | POA: Insufficient documentation

## 2021-11-11 DIAGNOSIS — X58XXXA Exposure to other specified factors, initial encounter: Secondary | ICD-10-CM | POA: Diagnosis not present

## 2021-11-11 NOTE — Progress Notes (Signed)
CHAVIS, TESSLER ([id number]) Visit Report for 11/11/2021 Abuse Risk Screen Details Patient Name: Date of Service: Brian Leon, [name] 11/11/2021 7:30 A M Medical Record Number: [id number] Patient Account Number: [id number] Date of Birth/Sex: Treating RN: [birth year] (75 y.o. Male) Deon Pilling Primary Care Deannah Rossi: Gerlene Fee Other Clinician: Referring Ciaira Natividad: Treating Shemeca Lukasik/Extender: Gaylene Brooks in Treatment: 0 Abuse Risk Screen Items Answer ABUSE RISK SCREEN: Has anyone close to you tried to hurt or harm you recentlyo No Do you feel uncomfortable with anyone in your familyo No Has anyone forced you do things that you didnt want to doo No Electronic Signature(s) Signed: 11/11/2021 5:47:56 PM By: Deon Pilling RN, BSN Entered By: Deon Pilling on 11/11/2021 08:03:01 -------------------------------------------------------------------------------- Activities of Daily Living Details Patient Name: Date of Service: Brian Leon, [name] 11/11/2021 7:30 A M Medical Record Number: [id number] Patient Account Number: [id number] Date of Birth/Sex: Treating RN: 1945/11/08 (76 y.o. Male) Deon Pilling Primary Care Denzil Mceachron: Gerlene Fee Other Clinician: Referring Malaya Cagley: Treating Nikita Humble/Extender: Gaylene Brooks in Treatment: 0 Activities of Daily Living Items Answer Activities of Daily Living (Please select one for each item) Drive Automobile Completely Able T Medications ake Completely Able Use T elephone Completely Able Care for Appearance Completely Able Use T oilet Completely Able Bath / Shower Completely Able Dress Self Completely Able Feed Self Completely Able Walk Completely Able Get In / Out Bed Completely Able Housework Completely Able Prepare Meals Completely English for Self Completely Able Electronic Signature(s) Signed: 11/11/2021 5:47:56 PM By: Deon Pilling RN,  BSN Entered By: Deon Pilling on 11/11/2021 08:03:20 -------------------------------------------------------------------------------- Education Screening Details Patient Name: Date of Service: Brian Leon, [place] NN P. 11/11/2021 7:30 A M Medical Record Number: [id number] Patient Account Number: [id number] Date of Birth/Sex: Treating RN: 07-17-1946 (75 y.o. Male) Deon Pilling Primary Care Ariyona Eid: Gerlene Fee Other Clinician: Referring Lyndzie Zentz: Treating Ashyr Hedgepath/Extender: Gaylene Brooks in Treatment: 0 Primary Learner Assessed: Patient Learning Preferences/Education Level/Primary Language Learning Preference: Explanation, Demonstration, Printed Material Highest Education Level: College or Above Preferred Language: English Cognitive Barrier Language Barrier: No Translator Needed: No Memory Deficit: No Emotional Barrier: No Cultural/Religious Beliefs Affecting Medical Care: No Physical Barrier Impaired Vision: Yes Glasses, missing right eye Impaired Hearing: No Decreased Hand dexterity: No Knowledge/Comprehension Knowledge Level: High Comprehension Level: High Ability to understand written instructions: High Ability to understand verbal instructions: High Motivation Anxiety Level: Calm Cooperation: Cooperative Education Importance: Acknowledges Need Interest in Health Problems: Asks Questions Perception: Coherent Willingness to Engage in Self-Management High Activities: Readiness to Engage in Self-Management High Activities: Electronic Signature(s) Signed: 11/11/2021 5:47:56 PM By: Deon Pilling RN, BSN Entered By: Deon Pilling on 11/11/2021 08:03:43 -------------------------------------------------------------------------------- Fall Risk Assessment Details Patient Name: Date of Service: Brian Leon, [place] NN P. 11/11/2021 7:30 A M Medical Record Number: [id number] Patient Account Number: [id number] Date of Birth/Sex: Treating RN: 1946-05-17 (75  y.o. Male) Deon Pilling Primary Care Raegen Tarpley: Gerlene Fee Other Clinician: Referring Royston Bekele: Treating Parul Porcelli/Extender: Jefm Miles Weeks in Treatment: 0 Fall Risk Assessment Items Have you had 2 or more falls in the last 12 monthso 0 No Have you had any fall that resulted in injury in the last 12 monthso 0 No FALLS RISK SCREEN History of falling - immediate or within 3 months 0 No Secondary diagnosis (Do you have 2 or more medical diagnoseso) 0 No Ambulatory aid None/bed rest/wheelchair/nurse 0 Yes Crutches/cane/walker 0  No Furniture 0 No Intravenous therapy Access/Saline/Heparin Lock 0 No Gait/Transferring Normal/ bed rest/ wheelchair 0 Yes Weak (short steps with or without shuffle, stooped but able to lift head while walking, may seek 0 No support from furniture) Impaired (short steps with shuffle, may have difficulty arising from chair, head down, impaired 0 No balance) Mental Status Oriented to own ability 0 Yes Electronic Signature(s) Signed: 11/11/2021 5:47:56 PM By: Deon Pilling RN, BSN Entered By: Deon Pilling on 11/11/2021 08:03:50 -------------------------------------------------------------------------------- Foot Assessment Details Patient Name: Date of Service: Brian Leon, [place] NN P. 11/11/2021 7:30 A M Medical Record Number: [id number] Patient Account Number: [id number] Date of Birth/Sex: Treating RN: 1946-05-29 (75 y.o. Male) Deon Pilling Primary Care Delesia Martinek: Gerlene Fee Other Clinician: Referring Osbaldo Mark: Treating Fabio Wah/Extender: Jefm Miles Weeks in Treatment: 0 Foot Assessment Items Site Locations + = Sensation present, - = Sensation absent, C = Callus, U = Ulcer R = Redness, W = Warmth, M = Maceration, PU = Pre-ulcerative lesion F = Fissure, S = Swelling, D = Dryness Assessment Right: Left: Other Deformity: No No Prior Foot Ulcer: No No Prior Amputation: No No Charcot Joint: No  No Ambulatory Status: Ambulatory Without Help Gait: Steady Electronic Signature(s) Signed: 11/11/2021 5:47:56 PM By: Deon Pilling RN, BSN Entered By: Deon Pilling on 11/11/2021 08:11:51 -------------------------------------------------------------------------------- Nutrition Risk Screening Details Patient Name: Date of Service: Brian Leon, [place] NN P. 11/11/2021 7:30 A M Medical Record Number: [id number] Patient Account Number: [id number] Date of Birth/Sex: Treating RN: 26-Nov-1945 (75 y.o. Male) Deon Pilling Primary Care Derrisha Foos: Gerlene Fee Other Clinician: Referring Keeana Pieratt: Treating Aristidis Talerico/Extender: Jefm Miles Weeks in Treatment: 0 Height (in): 68 Weight (lbs): 140 Body Mass Index (BMI): 21.3 Nutrition Risk Screening Items Score Screening NUTRITION RISK SCREEN: I have an illness or condition that made me change the kind and/or amount of food I eat 2 Yes I eat fewer than two meals per day 0 No I eat few fruits and vegetables, or milk products 0 No I have three or more drinks of beer, liquor or wine almost every day 0 No I have tooth or mouth problems that make it hard for me to eat 0 No I don't always have enough money to buy the food I need 0 No I eat alone most of the time 0 No I take three or more different prescribed or over-the-counter drugs a day 1 Yes Without wanting to, I have lost or gained 10 pounds in the last six months 0 No I am not always physically able to shop, cook and/or feed myself 0 No Nutrition Protocols Good Risk Protocol Moderate Risk Protocol 0 Provide education on nutrition High Risk Proctocol Risk Level: Moderate Risk Score: 3 Electronic Signature(s) Signed: 11/11/2021 5:47:56 PM By: Deon Pilling RN, BSN Entered By: Deon Pilling on 11/11/2021 08:04:00

## 2021-11-11 NOTE — Progress Notes (Signed)
Brian, Leon ([id number]) Visit Report for 11/11/2021 Chief Complaint Document Details Patient Name: Date of Service: Brian Leon, [name] 11/11/2021 7:30 A M Medical Record Number: [id number] Patient Account Number: [id number] Date of Birth/Sex: Treating RN: 12-28-1945 (75 y.o. Male) Brian Leon Primary Care Provider: Gerlene Fee Other Clinician: Referring Provider: Treating Provider/Extender: Gaylene Brooks in Treatment: 0 Information Obtained from: Patient Chief Complaint Left lower extremity wound due to trauma Electronic Signature(s) Signed: 11/11/2021 10:17:06 AM By: Kalman Shan DO Entered By: Kalman Shan on 11/11/2021 09:33:26 -------------------------------------------------------------------------------- Debridement Details Patient Name: Date of Service: Brian Leon, RO NN P. 11/11/2021 7:30 A M Medical Record Number: [id number] Patient Account Number: [id number] Date of Birth/Sex: Treating RN: [birth year] (75 y.o. Male) Brian Leon Primary Care Provider: Gerlene Fee Other Clinician: Referring Provider: Treating Provider/Extender: Gaylene Brooks in Treatment: 0 Debridement Performed for Assessment: Wound #1 Left,Medial Lower Leg Performed By: Physician Kalman Shan, DO Debridement Type: Debridement Severity of Tissue Pre Debridement: Fat layer exposed Level of Consciousness (Pre-procedure): Awake and Alert Pre-procedure Verification/Time Out Yes - 08:40 Taken: Start Time: 08:41 Pain Control: Lidocaine 5% topical ointment T Area Debrided (L x W): otal 2.9 (cm) x 1.3 (cm) = 3.77 (cm) Tissue and other material debrided: Viable, Non-Viable, Slough, Subcutaneous, Skin: Dermis , Skin: Epidermis, Fibrin/Exudate, Slough Level: Skin/Subcutaneous Tissue Debridement Description: Excisional Instrument: Curette Bleeding: Minimum Hemostasis Achieved: Pressure End Time: 08:45 Procedural Pain: 1 Post  Procedural Pain: 3 Response to Treatment: Procedure was tolerated well Level of Consciousness (Post- Awake and Alert procedure): Post Debridement Measurements of Total Wound Length: (cm) 2.9 Width: (cm) 1.3 Depth: (cm) 0.1 Volume: (cm) 0.296 Character of Wound/Ulcer Post Debridement: Requires Further Debridement Severity of Tissue Post Debridement: Fat layer exposed Post Procedure Diagnosis Same as Pre-procedure Electronic Signature(s) Signed: 11/11/2021 10:17:06 AM By: Kalman Shan DO Signed: 11/11/2021 5:47:56 PM By: Brian Pilling RN, BSN Entered By: Brian Leon on 11/11/2021 08:45:41 -------------------------------------------------------------------------------- HPI Details Patient Name: Date of Service: Brian Leon, RO NN P. 11/11/2021 7:30 A M Medical Record Number: [id number] Patient Account Number: [id number] Date of Birth/Sex: Treating RN: [birth year] (75 y.o. Male) Brian Leon Primary Care Provider: Gerlene Fee Other Clinician: Referring Provider: Treating Provider/Extender: Jefm Miles Weeks in Treatment: 0 History of Present Illness HPI Description: Admission 11/11/2021 [name] is a 76 year old male with a past medical history of polymyalgia rheumatica that presents to the clinic for a 60-monthhistory of nonhealing wound to the left lower extremity. He states that on Halloween he hit an object and created a wound that has not healed. He states he has seen dermatology and his primary care physician for this issue. On one occasion he had an UHaematologist He has also used Xeroform. It does not sound like he uses any kind of wound dressing consistently. He currently reports chronic pain to the wound site. He denies systemic signs of infection. He denies purulent drainage. Electronic Signature(s) Signed: 11/11/2021 10:17:06 AM By: HKalman ShanDO Entered By: HKalman Shanon 11/11/2021  09:44:02 -------------------------------------------------------------------------------- Physical Exam Details Patient Name: Date of Service: [name] RO NN P. 11/11/2021 7:30 A M Medical Record Number: [id number] Account Number: [id number] of Birth/Sex: Treating RN: 416-Jan-1947(75 y.o. Male) LLevan HurstPrimary Care Provider: AGerlene FeeOther Clinician: Referring Provider: Treating Provider/Extender: HGaylene Brooksin Treatment: 0 Constitutional respirations regular, non-labored and within target range for patient.. Cardiovascular 2+ dorsalis  pedis/posterior tibialis pulses. Psychiatric pleasant and cooperative. Notes Left lower extremity: Open wound to the anterior aspect with granulation tissue and nonviable tissue throughout. No surrounding signs of infection. No drainage noted. Mild pain to the wound bed on palpation. Electronic Signature(s) Signed: 11/11/2021 10:17:06 AM By: Kalman Shan DO Entered By: Kalman Shan on 11/11/2021 09:44:45 -------------------------------------------------------------------------------- Physician Orders Details Patient Name: Date of Service: Brian Leon, RO NN P. 11/11/2021 7:30 A M Medical Record Number: [id number] Patient Account Number: [id number] Date of Birth/Sex: Treating RN: [birth year] (75 y.o. Male) Brian Leon Primary Care Provider: Gerlene Fee Other Clinician: Referring Provider: Treating Provider/Extender: Gaylene Brooks in Treatment: 0 Verbal / Phone Orders: No Diagnosis Coding Follow-up Appointments ppointment in 1 week. - Dr. Heber [place] and [name], RN Room 7 Return A Other: - Pick up Gentamicin ointment at local pharmacy. [facility] DME-wound care products. Bathing/ Shower/ Hygiene May shower with protection but do not get wound dressing(s) wet. - when not changing the dressing. May shower and wash wound with soap and water. - with dressing  changes. Edema Control - Lymphedema / SCD / Other Elevate legs to the level of the heart or above for 30 minutes daily and/or when sitting, a frequency of: - 3-4 times a day throughout the day. Avoid standing for long periods of time. Exercise regularly Moisturize legs daily. - both legs every night before bed. Wound Treatment Wound #1 - Lower Leg Wound Laterality: Left, Medial Cleanser: Soap and Water Every Other Day/30 Days Discharge Instructions: May shower and wash wound with dial antibacterial soap and water prior to dressing change. Cleanser: [facility] Ancillary Kit - 15 Day Supply (DME) (Generic) Every Other Day/30 Days Discharge Instructions: Use supplies as instructed; Kit contains: (15) Saline Bullets; (15) 3x3 Gauze; 15 pr Gloves Peri-Wound Care: Skin Prep (DME) (Generic) Every Other Day/30 Days Discharge Instructions: Use skin prep as directed Topical: Gentamicin Every Other Day/30 Days Discharge Instructions: Apply directly to wound bed. Prim Dressing: Hydrofera Blue Classic Foam, 2x2 in Every Other Day/30 Days ary Discharge Instructions: Moisten with saline. Apply over the gentamicin ointment. Secondary Dressing: Zetuvit Plus Silicone Border Dressing 4x4 (in/in) (DME) (Generic) Every Other Day/30 Days Discharge Instructions: Apply silicone border over primary dressing as directed. Patient Medications llergies: Sulfa (Sulfonamide Antibiotics), acetaminophen A Notifications Medication Indication Start End lidocaine DOSE topical 4 % gel - gel topical applied only to clinic. 11/11/2021 gentamicin DOSE 1 - topical 0.1 % cream - Apply with dressing changes Electronic Signature(s) Signed: 11/11/2021 10:17:06 AM By: Kalman Shan DO Previous Signature: 11/11/2021 9:31:24 AM Version By: Kalman Shan DO Entered By: Kalman Shan on 11/11/2021 09:45:01 Prescription 11/11/2021 -------------------------------------------------------------------------------- Wynetta Emery, [place] P.  Kalman Shan DO Patient Name: Provider: 1946-06-30 [id number] Date of Birth: NPI#: Male [id number] Sex: DEA #: [phone] Phone #: License #: [place] Patient Address: [address] [address] [place], [place] [address] [place], [place] [address] [phone] Allergies Sulfa (Sulfonamide Antibiotics); acetaminophen Medication Medication: Route: Strength: Form: lidocaine 4 % topical gel topical 4% gel Class: TOPICAL LOCAL ANESTHETICS Dose: Frequency / Time: Indication: gel topical applied only to clinic. Number of Refills: Number of Units: 0 Generic Substitution: Start Date: End Date: One Time Use: Substitution Permitted No Note to Pharmacy: Hand Signature: Date(s): Electronic Signature(s) Signed: 11/11/2021 10:17:06 AM By: Kalman Shan DO Entered By: Kalman Shan on 11/11/2021 09:45:01 -------------------------------------------------------------------------------- Problem List Details Patient Name: Date of Service: Brian Leon, RO NN P. 11/11/2021  7:30 A M Medical Record Number: [id number] Patient Account Number: [id number] Date of Birth/Sex: Treating RN: 1946/03/25 (75 y.o. Male) Brian Leon Primary Care Provider: Gerlene Fee Other Clinician: Referring Provider: Treating Provider/Extender: Gaylene Brooks in Treatment: 0 Active Problems ICD-10 Encounter Code Description Active Date MDM Diagnosis [id number] Non-pressure chronic ulcer of other part of left lower leg with fat layer exposed2/06/2022 No Yes T14.90XA Injury, unspecified, initial encounter 11/11/2021 No Yes M35.3 Polymyalgia rheumatica 11/11/2021 No Yes Inactive Problems Resolved Problems Electronic Signature(s) Signed: 11/11/2021 10:17:06 AM By: Kalman Shan DO Entered By: Kalman Shan on 11/11/2021  09:32:50 -------------------------------------------------------------------------------- Progress Note Details Patient Name: Date of Service: Brian Leon, RO NN P. 11/11/2021 7:30 A M Medical Record Number: [id number] Patient Account Number: [id number] Date of Birth/Sex: Treating RN: 01-20-1946 (75 y.o. Male) Brian Leon Primary Care Provider: Gerlene Fee Other Clinician: Referring Provider: Treating Provider/Extender: Gaylene Brooks in Treatment: 0 Subjective Chief Complaint Information obtained from Patient Left lower extremity wound due to trauma History of Present Illness (HPI) Admission 11/11/2021 Mr. Bedford Donigan is a 76 year old male with a past medical history of polymyalgia rheumatica that presents to the clinic for a 48-monthhistory of nonhealing wound to the left lower extremity. He states that on Halloween he hit an object and created a wound that has not healed. He states he has seen dermatology and his primary care physician for this issue. On one occasion he had an UHaematologist He has also used Xeroform. It does not sound like he uses any kind of wound dressing consistently. He currently reports chronic pain to the wound site. He denies systemic signs of infection. He denies purulent drainage. Patient History Information obtained from Chart. Allergies Sulfa (Sulfonamide Antibiotics), acetaminophen Family History Cancer - Mother, No family history of Diabetes, Heart Disease, Hypertension, Kidney Disease, Lung Disease, Seizures, Stroke, Thyroid Problems, Tuberculosis. Social History Former smoker - quit 40 years ago, Marital Status - Married, Alcohol Use - Never, Drug Use - Current History - marijuana, Caffeine Use - Rarely. Medical History Eyes Denies history of Cataracts, Glaucoma, Optic Neuritis Ear/Nose/Mouth/Throat Denies history of Chronic sinus problems/congestion, Middle ear problems Hematologic/Lymphatic Denies history of  Anemia, Hemophilia, Human Immunodeficiency Virus, Lymphedema, Sickle Cell Disease Respiratory Patient has history of Asthma Denies history of Aspiration, Chronic Obstructive Pulmonary Disease (COPD), Pneumothorax, Sleep Apnea, Tuberculosis Cardiovascular Patient has history of Hypertension Denies history of Angina, Arrhythmia, Congestive Heart Failure, Coronary Artery Disease, Deep Vein Thrombosis, Hypotension, Myocardial Infarction, Peripheral Arterial Disease, Peripheral Venous Disease, Phlebitis, Vasculitis Gastrointestinal Patient has history of Hepatitis A Denies history of Cirrhosis , Colitis, Crohnoos, Hepatitis B, Hepatitis C Endocrine Denies history of Type I Diabetes, Type II Diabetes Genitourinary Denies history of End Stage Renal Disease Immunological Denies history of Lupus Erythematosus, Raynaudoos, Scleroderma Integumentary (Skin) Denies history of History of Burn Musculoskeletal Patient has history of Rheumatoid Arthritis Denies history of Gout, Osteoarthritis, Osteomyelitis Psychiatric Denies history of Anorexia/bulimia, Confinement Anxiety Hospitalization/Surgery History - 5 years ago Renal Cell carcinoma. 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 Eyes Complains or has symptoms of Glasses /  Contacts - glasses when driving. Denies complaints or symptoms of Dry Eyes, Vision  Changes. Endocrine Denies complaints or symptoms of Heat/cold intolerance. Genitourinary Denies complaints or symptoms of Frequent urination. Integumentary (Skin) Complains or has symptoms of Wounds - left leg. Psychiatric Denies complaints or symptoms of Claustrophobia, Suicidal. Objective Constitutional respirations regular, non-labored and within target range for patient.. Vitals Time Taken: 7:53 AM, Height: 68 in, Source: Stated, Weight: 140 lbs, Source: Stated, BMI: 21.3, Temperature: 98.4 F, Pulse: 69 bpm, Respiratory Rate: 20 breaths/min, Blood Pressure: 149/81 mmHg. Cardiovascular 2+ dorsalis pedis/posterior tibialis pulses. Psychiatric pleasant and cooperative. General Notes: Left lower extremity: Open wound to the anterior aspect with granulation tissue and nonviable tissue throughout. No surrounding signs of infection. No drainage noted. Mild pain to the wound bed on palpation. Integumentary (Hair, Skin) Wound #1 status is Open. Original cause of wound was Trauma. The date acquired was: 08/02/2021. The wound is located on the Left,Medial Lower Leg. The wound measures 2.9cm length x 1.3cm width x 0.1cm depth; 2.961cm^2 area and 0.296cm^3 volume. There is Fat Layer (Subcutaneous Tissue) exposed. There is no tunneling or undermining noted. There is a medium amount of serosanguineous drainage noted. The wound margin is distinct with the outline attached to the wound base. There is medium (34-66%) red, pink, hyper - granulation within the wound bed. There is a medium (34-66%) amount of necrotic tissue within the wound bed including Adherent Slough. Assessment Active Problems ICD-10 Non-pressure chronic ulcer of other part of left lower leg with fat layer exposed Injury, unspecified, initial encounter Polymyalgia rheumatica Patient presents with a 52-monthhistory of nonhealing wound to his left lower extremity due to trauma. No surrounding signs of infection. However I do  think that the wound bed itself has moderate bioburden based on the appearance. I debrided nonviable tissue. I recommended using gentamicin ointment along with Hydrofera Blue every other day. There is no swelling on exam and I do not think he would benefit from compression therapy. TBI's on the left was 0.77 and he has strong bounding pedal pulses suggesting adequate blood flow. I recommended follow-up in 1 week and he knows to call with any questions or concerns. 47 minutes was spent on the encounter including face-to-face, EMR review and coordination of care Procedures Wound #1 Pre-procedure diagnosis of Wound #1 is an Abrasion located on the Left,Medial Lower Leg .Severity of Tissue Pre Debridement is: Fat layer exposed. There was a Excisional Skin/Subcutaneous Tissue Debridement with a total area of 3.77 sq cm performed by HKalman Shan DO. With the following instrument(s): Curette to remove Viable and Non-Viable tissue/material. Material removed includes Subcutaneous Tissue, Slough, Skin: Dermis, Skin: Epidermis, and Fibrin/Exudate after achieving pain control using Lidocaine 5% topical ointment. A time out was conducted at 08:40, prior to the start of the procedure. A Minimum amount of bleeding was controlled with Pressure. The procedure was tolerated well with a pain level of 1 throughout and a pain level of 3 following the procedure. Post Debridement Measurements: 2.9cm length x 1.3cm width x 0.1cm depth; 0.296cm^3 volume. Character of Wound/Ulcer Post Debridement requires further debridement. Severity of Tissue Post Debridement is: Fat layer exposed. Post procedure Diagnosis Wound #1: Same as Pre-Procedure Plan Follow-up Appointments: Return Appointment in 1 week. - Dr. HHeber [place] BTammi Klippel RN Room 7 Other: - Pick up Gentamicin ointment at local pharmacy. [facility] D[facility]-wound care products. Bathing/ Shower/ Hygiene: May shower with protection but do not get wound dressing(s) wet. - when not  changing the dressing. May  shower and wash wound with soap and water. - with dressing changes. Edema Control - Lymphedema / SCD / Other: Elevate legs to the level of the heart or above for 30 minutes daily and/or when sitting, a frequency of: - 3-4 times a day throughout the day. Avoid standing for long periods of time. Exercise regularly Moisturize legs daily. - both legs every night before bed. The following medication(s) was prescribed: lidocaine topical 4 % gel gel topical applied only to clinic. was prescribed at facility gentamicin topical 0.1 % cream 1 Apply with dressing changes starting 11/11/2021 WOUND #1: - Lower Leg Wound Laterality: Left, Medial Cleanser: Soap and Water Every Other Day/30 Days Discharge Instructions: May shower and wash wound with dial antibacterial soap and water prior to dressing change. Cleanser: [facility] Ancillary Kit - 15 Day Supply (DME) (Generic) Every Other Day/30 Days Discharge Instructions: Use supplies as instructed; Kit contains: (15) Saline Bullets; (15) 3x3 Gauze; 15 pr Gloves Peri-Wound Care: Skin Prep (DME) (Generic) Every Other Day/30 Days Discharge Instructions: Use skin prep as directed Topical: Gentamicin Every Other Day/30 Days Discharge Instructions: Apply directly to wound bed. Prim Dressing: Hydrofera Blue Classic Foam, 2x2 in Every Other Day/30 Days ary Discharge Instructions: Moisten with saline. Apply over the gentamicin ointment. Secondary Dressing: Zetuvit Plus Silicone Border Dressing 4x4 (in/in) (DME) (Generic) Every Other Day/30 Days Discharge Instructions: Apply silicone border over primary dressing as directed. 1. In office sharp debridement 2. Gentamicin and Hydrofera Blue 3. Follow-up in 1 week Electronic Signature(s) Signed: 11/11/2021 10:17:06 AM By: Kalman Shan DO Entered By: Kalman Shan on 11/11/2021 09:47:19 -------------------------------------------------------------------------------- HxROS Details Patient  Name: Date of Service: Brian Leon, RO NN P. 11/11/2021 7:30 A M Medical Record Number: [id number] Patient Account Number: [id number] Date of Birth/Sex: Treating RN: Nov 09, 1945 (75 y.o. Male) Brian Leon Primary Care Provider: Gerlene Fee Other Clinician: Referring Provider: Treating Provider/Extender: Gaylene Brooks in Treatment: 0 Information Obtained From Chart Constitutional Symptoms (General Health) Complaints and Symptoms: Negative for: Fatigue; Fever; Chills; Marked Weight Change Eyes Complaints and Symptoms: Positive for: Glasses / Contacts - glasses when driving Negative for: Dry Eyes; Vision Changes Medical History: Negative for: Cataracts; Glaucoma; Optic Neuritis Past Medical History Notes: Blind right eye WEARS PROSTHESIS Endocrine Complaints and Symptoms: Negative for: Heat/cold intolerance Medical History: Negative for: Type I Diabetes; Type II Diabetes Genitourinary Complaints and Symptoms: Negative for: Frequent urination Medical History: Negative for: End Stage Renal Disease Past Medical History Notes: 1/3kidney missing per patient-Renal Cell carcinoma Renal Cyst 5 years ago Integumentary (Skin) Complaints and Symptoms: Positive for: Wounds - left leg Medical History: Negative for: History of Burn Psychiatric Complaints and Symptoms: Negative for: Claustrophobia; Suicidal Medical History: Negative for: Anorexia/bulimia; Confinement Anxiety Ear/Nose/Mouth/Throat Medical History: Negative for: Chronic sinus problems/congestion; Middle ear problems Past Medical History Notes: Allergic rhinitis Hematologic/Lymphatic Medical History: Negative for: Anemia; Hemophilia; Human Immunodeficiency Virus; Lymphedema; Sickle Cell Disease Respiratory Medical History: Positive for: Asthma Negative for: Aspiration; Chronic Obstructive Pulmonary Disease (COPD); Pneumothorax; Sleep Apnea; Tuberculosis Past Medical History  Notes: Pneumonia Cardiovascular Medical History: Positive for: Hypertension Negative for: Angina; Arrhythmia; Congestive Heart Failure; Coronary Artery Disease; Deep Vein Thrombosis; Hypotension; Myocardial Infarction; Peripheral Arterial Disease; Peripheral Venous Disease; Phlebitis; Vasculitis Past Medical History Notes: Thoracic aortic aneurysm Thrombocytosis Gastrointestinal Medical History: Positive for: Hepatitis A Negative for: Cirrhosis ; Colitis; Crohns; Hepatitis B; Hepatitis C Immunological Medical History: Negative for: Lupus Erythematosus; Raynauds; Scleroderma Musculoskeletal Medical History: Positive for: Rheumatoid Arthritis Negative for: Gout; Osteoarthritis; Osteomyelitis  Past Medical History Notes: 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 Eczema Neurologic Medical History: Past Medical History Notes: Paraureteric diverticulum Oncologic Medical History: Past Medical History Notes: Renal Cell carcinoma- 5 years ago Immunizations Pneumococcal Vaccine: Received Pneumococcal Vaccination: Yes Received Pneumococcal Vaccination On or After 60th Birthday: Yes Implantable Devices No devices added Hospitalization / Surgery History Type of Hospitalization/Surgery 5 years ago Renal Cell carcinoma Family and Social History Cancer: Yes - Mother; Diabetes: No; Heart Disease: No; Hypertension: No; Kidney Disease: No; Lung Disease: No; Seizures: No; Stroke: No; Thyroid Problems: No; Tuberculosis: No; Former smoker - quit 40 years ago; Marital Status - Married; Alcohol Use: Never; Drug Use: Current History - marijuana; Caffeine Use: Rarely; Financial Concerns: No; Food, Clothing or Shelter Needs: No; Support System Lacking: No; Transportation Concerns: No Electronic Signature(s) Signed: 11/11/2021 10:17:06 AM By: Kalman Shan DO Signed: 11/11/2021 5:47:56 PM By: Brian Pilling RN, BSN Signed: 11/11/2021 6:05:40 PM By:  Brian Hurst RN, BSN Entered By: Brian Leon on 11/11/2021 08:02:11 -------------------------------------------------------------------------------- SuperBill Details Patient Name: Date of Service: Brian Leon, RO NN P. 11/11/2021 Medical Record Number: [id number] Patient Account Number: [id number] Date of Birth/Sex: Treating RN: [birth year] (75 y.o. Male) Brian Leon Primary Care Provider: Gerlene Fee Other Clinician: Referring Provider: Treating Provider/Extender: Gaylene Brooks in Treatment: 0 Diagnosis Coding ICD-10 Codes Code Description [phone] Non-pressure chronic ulcer of other part of left lower leg with fat layer exposed T14.90XA Injury, unspecified, initial encounter M35.3 Polymyalgia rheumatica Facility Procedures CPT4 Code: [id number] Description: [place] VISIT-LEV 3 EST PT Modifier: Quantity: 1 CPT4 Code: [id number] Description: 94503 - DEB SUBQ TISSUE 20 SQ CM/< ICD-10 Diagnosis Description L97.822 Non-pressure chronic ulcer of other part of left lower leg with fat layer expos Modifier: ed Quantity: 1 Physician Procedures : CPT4 Code Description Modifier [id number] - WC PHYS LEVEL 4 - NEW PT ICD-10 Diagnosis Description L97.822 Non-pressure chronic ulcer of other part of left lower leg with fat layer exposed T14.90XA Injury, unspecified, initial encounter M35.3  Polymyalgia rheumatica Quantity: 1 : [id number] - WC PHYS SUBQ TISS 20 SQ CM ICD-10 Diagnosis Description L97.822 Non-pressure chronic ulcer of other part of left lower leg with fat layer exposed Quantity: 1 Electronic Signature(s) Signed: 11/11/2021 10:17:06 AM By: Kalman Shan DO Entered By: Kalman Shan on 11/11/2021 09:47:31

## 2021-11-12 ENCOUNTER — Other Ambulatory Visit: Payer: Self-pay

## 2021-11-12 DIAGNOSIS — E7800 Pure hypercholesterolemia, unspecified: Secondary | ICD-10-CM

## 2021-11-12 MED ORDER — ATORVASTATIN CALCIUM 10 MG PO TABS: 10.0000 mg | ORAL_TABLET | Freq: Every day | ORAL | 3 refills | Status: DC

## 2021-11-17 ENCOUNTER — Ambulatory Visit: Payer: Medicare Other | Admitting: Rheumatology

## 2021-11-18 ENCOUNTER — Encounter (HOSPITAL_BASED_OUTPATIENT_CLINIC_OR_DEPARTMENT_OTHER): Payer: Medicare Other | Admitting: Internal Medicine

## 2021-11-18 ENCOUNTER — Other Ambulatory Visit: Payer: Self-pay

## 2021-11-18 DIAGNOSIS — L97822 Non-pressure chronic ulcer of other part of left lower leg with fat layer exposed: Secondary | ICD-10-CM | POA: Diagnosis not present

## 2021-11-18 DIAGNOSIS — T1490XA Injury, unspecified, initial encounter: Secondary | ICD-10-CM | POA: Diagnosis not present

## 2021-11-18 DIAGNOSIS — Z87891 Personal history of nicotine dependence: Secondary | ICD-10-CM | POA: Diagnosis not present

## 2021-11-18 DIAGNOSIS — M353 Polymyalgia rheumatica: Secondary | ICD-10-CM | POA: Diagnosis not present

## 2021-11-18 NOTE — Progress Notes (Signed)
Brian Leon, Brian Leon ([id number]) Visit Report for 11/18/2021 Chief Complaint Document Details Patient Name: Date of Service: Brian Leon, [name] 11/18/2021 9:45 A M Medical Record Number: [id number] Patient Account Number: [id number] Date of Birth/Sex: Treating RN: 09-06-1946 (76 y.o. M) Primary Care Provider: Gerlene Leon Other Clinician: Referring Provider: Treating Provider/Extender: Brian Leon in Treatment: 1 Information Obtained from: Patient Chief Complaint Left lower extremity wound due to trauma Electronic Signature(s) Signed: 11/18/2021 11:12:16 AM By: Brian Shan DO Entered By: Brian Leon on 11/18/2021 11:09:11 -------------------------------------------------------------------------------- Debridement Details Patient Name: Date of Service: Brian Leon, RO NN P. 11/18/2021 9:45 A M Medical Record Number: [id number] Patient Account Number: [id number] Date of Birth/Sex: Treating RN: Aug 27, 1946 (76 y.o. [name] Primary Care Provider: Gerlene Leon Other Clinician: Referring Provider: Treating Provider/Extender: Brian Leon in Treatment: 1 Debridement Performed for Assessment: Wound #1 Left,Medial Lower Leg Performed By: Physician Brian Shan, DO Debridement Type: Debridement Severity of Tissue Pre Debridement: Fat layer exposed Level of Consciousness (Pre-procedure): Awake and Alert Pre-procedure Verification/Time Out Yes - 10:33 Taken: Start Time: 10:34 Pain Control: Other : benzocaine 20% T Area Debrided (L x W): otal 2.2 (cm) x 1 (cm) = 2.2 (cm) Tissue and other material debrided: Non-Viable, Slough, Subcutaneous, Slough Level: Skin/Subcutaneous Tissue Debridement Description: Excisional Instrument: Curette Bleeding: Minimum Hemostasis Achieved: Pressure End Time: 10:39 Response to Treatment: Procedure was tolerated well Level of Consciousness (Post- Awake and  Alert procedure): Post Debridement Measurements of Total Wound Length: (cm) 2.2 Width: (cm) 1 Depth: (cm) 0.1 Volume: (cm) 0.173 Character of Wound/Ulcer Post Debridement: Stable Severity of Tissue Post Debridement: Fat layer exposed Post Procedure Diagnosis Same as Pre-procedure Electronic Signature(s) Signed: 11/18/2021 11:12:16 AM By: Brian Shan DO Signed: 11/18/2021 5:10:40 PM By: Brian Leon Entered By: Brian Leon on 11/18/2021 10:39:16 -------------------------------------------------------------------------------- HPI Details Patient Name: Date of Service: Brian Leon, RO NN P. 11/18/2021 9:45 A M Medical Record Number: [id number] Patient Account Number: [id number] Date of Birth/Sex: Treating RN: 02-Sep-1946 (76 y.o. M) Primary Care Provider: Gerlene Leon Other Clinician: Referring Provider: Treating Provider/Extender: Brian Leon in Treatment: 1 History of Present Illness HPI Description: Admission 11/11/2021 [name] is a 76 year old male with a past medical history of polymyalgia rheumatica that presents to the clinic for a 70-monthhistory of nonhealing wound to the left lower extremity. He states that on Halloween he hit an object and created a wound that has not healed. He states he has seen dermatology and his primary care physician for this issue. On one occasion he had an UHaematologist He has also used Xeroform. It does not sound like he uses any kind of wound dressing consistently. He currently reports chronic pain to the wound site. He denies systemic signs of infection. He denies purulent drainage. 2/16; patient presents for follow-up. He has been using Hydrofera Blue and gentamicin ointment to the wound bed without issues. He reports improvement in wound healing. He currently denies signs of infection. Electronic Signature(s) Signed: 11/18/2021 11:12:16 AM By: HKalman ShanDO Entered By: HKalman Shanon  11/18/2021 11:09:56 -------------------------------------------------------------------------------- Physical Exam Details Patient Name: Date of Service: Brian Leon RO NN P. 11/18/2021 9:45 A M Medical Record Number: [id number] Account Number: [id number] of Birth/Sex: Treating RN: [birth year](76y.o. M) Primary Care Provider: AGerlene FeeOther Clinician: Referring Provider: Treating Provider/Extender: HHubert Azurein Treatment: 1 Constitutional respirations regular, non-labored and within target range for  patient.Marland Kitchen Psychiatric pleasant and cooperative. Notes Left lower extremity: Open wound to the anterior aspect with granulation tissue and nonviable tissue present. No surrounding signs of infection. Appears well- healing. No pain to the wound bed on palpation. Electronic Signature(s) Signed: 11/18/2021 11:12:16 AM By: Brian Shan DO Entered By: Brian Leon on 11/18/2021 11:10:39 -------------------------------------------------------------------------------- Physician Orders Details Patient Name: Date of Service: Brian Leon, RO NN P. 11/18/2021 9:45 A M Medical Record Number: [id number] Patient Account Number: [id number] Date of Birth/Sex: Treating RN: 1945-11-20 (76 y.o. [name] Primary Care Provider: Gerlene Leon Other Clinician: Referring Provider: Treating Provider/Extender: Brian Leon in Treatment: 1 Verbal / Phone Orders: No Diagnosis Coding ICD-10 Coding Code Description [phone] Non-pressure chronic ulcer of other part of left lower leg with fat layer exposed T14.90XA Injury, unspecified, initial encounter M35.3 Polymyalgia rheumatica Follow-up Appointments ppointment in 1 week. - Dr. Heber Leon and Brian Anna RN, Room 7 Return A Other: Brian Leon DME-wound care products. Bathing/ Shower/ Hygiene May shower with protection but do not get wound dressing(s) wet. - when not  changing the dressing. May shower and wash wound with soap and water. - with dressing changes. Edema Control - Lymphedema / SCD / Other Elevate legs to the level of the heart or above for 30 minutes daily and/or when sitting, a frequency of: - 3-4 times a day throughout the day. Avoid standing for long periods of time. Exercise regularly Moisturize legs daily. - both legs every night before bed. Wound Treatment Wound #1 - Lower Leg Wound Laterality: Left, Medial Cleanser: Soap and Water Every Other Day/30 Days Discharge Instructions: May shower and wash wound with dial antibacterial soap and water prior to dressing change. Cleanser: Byram Ancillary Kit - 15 Day Supply (Generic) Every Other Day/30 Days Discharge Instructions: Use supplies as instructed; Kit contains: (15) Saline Bullets; (15) 3x3 Gauze; 15 pr Gloves Peri-Wound Care: Skin Prep (Generic) Every Other Day/30 Days Discharge Instructions: Use skin prep as directed Topical: Gentamicin Every Other Day/30 Days Discharge Instructions: Apply directly to wound bed. Prim Dressing: Hydrofera Blue Classic Foam, 2x2 in Every Other Day/30 Days ary Discharge Instructions: Moisten with saline. Apply over the gentamicin ointment. Secondary Dressing: Zetuvit Plus Silicone Border Dressing 4x4 (in/in) (Generic) Every Other Day/30 Days Discharge Instructions: Apply silicone border over primary dressing as directed. Electronic Signature(s) Signed: 11/18/2021 11:12:16 AM By: Brian Shan DO Entered By: Brian Leon on 11/18/2021 11:10:55 -------------------------------------------------------------------------------- Problem List Details Patient Name: Date of Service: Brian Leon, RO NN P. 11/18/2021 9:45 A M Medical Record Number: [id number] Patient Account Number: [id number] Date of Birth/Sex: Treating RN: 1946-02-28 (76 y.o. [name] Primary Care Provider: Gerlene Leon Other Clinician: Referring Provider: Treating  Provider/Extender: Brian Leon in Treatment: 1 Active Problems ICD-10 Encounter Code Description Active Date MDM Diagnosis [phone] Non-pressure chronic ulcer of other part of left lower leg with fat layer exposed2/06/2022 No Yes T14.90XA Injury, unspecified, initial encounter 11/11/2021 No Yes M35.3 Polymyalgia rheumatica 11/11/2021 No Yes Inactive Problems Resolved Problems Electronic Signature(s) Signed: 11/18/2021 11:12:16 AM By: Brian Shan DO Entered By: Brian Leon on 11/18/2021 11:08:56 -------------------------------------------------------------------------------- Progress Note Details Patient Name: Date of Service: Brian Leon, RO NN P. 11/18/2021 9:45 A M Medical Record Number: [id number] Patient Account Number: [id number] Date of Birth/Sex: Treating RN: 1946-05-21 (76 y.o. M) Primary Care Provider: Gerlene Leon Other Clinician: Referring Provider: Treating Provider/Extender: Brian Leon in Treatment: 1 Subjective Chief Complaint Information obtained from Patient Left lower  extremity wound due to trauma History of Present Illness (HPI) Admission 11/11/2021 Mr. Kyrell Cuevas is a 76 year old male with a past medical history of polymyalgia rheumatica that presents to the clinic for a 11-monthhistory of nonhealing wound to the left lower extremity. He states that on Halloween he hit an object and created a wound that has not healed. He states he has seen dermatology and his primary care physician for this issue. On one occasion he had an UHaematologist He has also used Xeroform. It does not sound like he uses any kind of wound dressing consistently. He currently reports chronic pain to the wound site. He denies systemic signs of infection. He denies purulent drainage. 2/16; patient presents for follow-up. He has been using Hydrofera Blue and gentamicin ointment to the wound bed without issues. He reports  improvement in wound healing. He currently denies signs of infection. Patient History Information obtained from Chart. Family History Cancer - Mother, No family history of Diabetes, Heart Disease, Hypertension, Kidney Disease, Lung Disease, Seizures, Stroke, Thyroid Problems, Tuberculosis. Social History Former smoker - quit 40 years ago, Marital Status - Married, Alcohol Use - Never, Drug Use - Current History - marijuana, Caffeine Use - Rarely. Medical History Eyes Denies history of Cataracts, Glaucoma, Optic Neuritis Ear/Nose/Mouth/Throat Denies history of Chronic sinus problems/congestion, Middle ear problems Hematologic/Lymphatic Denies history of Anemia, Hemophilia, Human Immunodeficiency Virus, Lymphedema, Sickle Cell Disease Respiratory Patient has history of Asthma Denies history of Aspiration, Chronic Obstructive Pulmonary Disease (COPD), Pneumothorax, Sleep Apnea, Tuberculosis Cardiovascular Patient has history of Hypertension Denies history of Angina, Arrhythmia, Congestive Heart Failure, Coronary Artery Disease, Deep Vein Thrombosis, Hypotension, Myocardial Infarction, Peripheral Arterial Disease, Peripheral Venous Disease, Phlebitis, Vasculitis Gastrointestinal Patient has history of Hepatitis A Denies history of Cirrhosis , Colitis, Crohnoos, Hepatitis B, Hepatitis C Endocrine Denies history of Type I Diabetes, Type II Diabetes Genitourinary Denies history of End Stage Renal Disease Immunological Denies history of Lupus Erythematosus, Raynaudoos, Scleroderma Integumentary (Skin) Denies history of History of Burn Musculoskeletal Patient has history of Rheumatoid Arthritis Denies history of Gout, Osteoarthritis, Osteomyelitis Psychiatric Denies history of Anorexia/bulimia, Confinement Anxiety Hospitalization/Surgery History - 5 years ago Renal Cell carcinoma. 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 Vitals Time Taken: 10:20 AM, Height: 68 in, Weight: 140 lbs, BMI: 21.3, Temperature: 98.7 F, Pulse: 87 bpm, Respiratory Rate: 16 breaths/min, Blood Pressure: 153/84 mmHg. Psychiatric pleasant and cooperative. General Notes: Left lower extremity: Open wound to the anterior aspect with granulation tissue and nonviable tissue present. No surrounding signs of infection. Appears well-healing. No pain to the wound bed on palpation. Integumentary (Hair, Skin) Wound #1 status is Open. Original cause of wound was Trauma. The date acquired was: 08/02/2021. The wound has been in treatment 1 weeks. The wound is located on the Left,Medial Lower Leg. The wound measures 2.2cm length x 1cm width x 0.1cm depth; 1.728cm^2 area and 0.173cm^3 volume. There is Fat Layer (Subcutaneous Tissue) exposed. There is no tunneling or undermining noted. There is a medium amount of serosanguineous drainage noted. The wound margin is distinct with the outline attached to the wound base. There is medium (34-66%) red, pink, hyper -  granulation within the wound bed. There is a medium (34-66%) amount of necrotic tissue within the wound bed including Adherent Slough. Assessment Active Problems ICD-10 Non-pressure chronic ulcer of other part of left lower leg with fat layer exposed Injury, unspecified, initial encounter Polymyalgia rheumatica Patient's wound has shown improvement in size and appearance since last clinic  visit. I debrided nonviable tissue. No surrounding signs of infection. He has done well with Hydrofera Blue and gentamicin ointment and I recommended continuing this. Follow-up in 1 week. Procedures Wound #1 Pre-procedure diagnosis of Wound #1 is an Abrasion located on the Left,Medial Lower Leg .Severity of Tissue Pre Debridement is: Fat layer exposed. There was a Excisional Skin/Subcutaneous Tissue Debridement with a total area of 2.2 sq cm performed by Brian Shan, DO. With the following instrument(s): Curette to remove Non-Viable tissue/material. Material removed includes Subcutaneous Tissue and Slough and after achieving pain control using Other (benzocaine 20%). No specimens were taken. A time out was conducted at 10:33, prior to the start of the procedure. A Minimum amount of bleeding was controlled with Pressure. The procedure was tolerated well. Post Debridement Measurements: 2.2cm length x 1cm width x 0.1cm depth; 0.173cm^3 volume. Character of Wound/Ulcer Post Debridement is stable. Severity of Tissue Post Debridement is: Fat layer exposed. Post procedure Diagnosis Wound #1: Same as Pre-Procedure Plan Follow-up Appointments: Return Appointment in 1 week. - Dr. Heber Moraine and Brian Anna RN, Room 7 Other: Brian Leon DME-wound care products. Bathing/ Shower/ Hygiene: May shower with protection but do not get wound dressing(s) wet. - when not changing the dressing. May shower and wash wound with soap and water. - with dressing changes. Edema Control - Lymphedema / SCD / Other: Elevate legs to the level of the heart or above for 30 minutes daily and/or when sitting, a frequency of: - 3-4 times a day throughout the day. Avoid standing for long periods of time. Exercise regularly Moisturize legs daily. - both legs every night before bed. WOUND #1: - Lower Leg Wound Laterality: Left, Medial Cleanser: Soap and Water Every Other Day/30 Days Discharge Instructions: May shower and wash wound with  dial antibacterial soap and water prior to dressing change. Cleanser: Byram Ancillary Kit - 15 Day Supply (Generic) Every Other Day/30 Days Discharge Instructions: Use supplies as instructed; Kit contains: (15) Saline Bullets; (15) 3x3 Gauze; 15 pr Gloves Peri-Wound Care: Skin Prep (Generic) Every Other Day/30 Days Discharge Instructions: Use skin prep as directed Topical: Gentamicin Every Other Day/30 Days Discharge Instructions: Apply directly to wound bed. Prim Dressing: Hydrofera Blue Classic Foam, 2x2 in Every Other Day/30 Days ary Discharge Instructions: Moisten with saline. Apply over the gentamicin ointment. Secondary Dressing: Zetuvit Plus Silicone Border Dressing 4x4 (in/in) (Generic) Every Other Day/30 Days Discharge Instructions: Apply silicone border over primary dressing as directed. 1. Hydrofera Blue and gentamicin ointment every other day 2. In office sharp debridement 3. Follow-up in 1 week Electronic Signature(s) Signed: 11/18/2021 11:12:16 AM By: Brian Shan DO Entered By: Brian Leon on 11/18/2021 11:11:48 -------------------------------------------------------------------------------- HxROS Details Patient Name: Date of Service: Brian Leon, RO NN P. 11/18/2021 9:45 A M Medical Record Number: [id number] Patient Account Number: [id number] Date of Birth/Sex: Treating RN: 1946-03-13 (76 y.o. M) Primary Care Provider: Gerlene Leon Other Clinician: Referring Provider: Treating Provider/Extender: Brian Leon in Treatment: 1 Information Obtained From Chart Eyes Medical History: Negative for: Cataracts; Glaucoma; Optic Neuritis Past Medical History Notes: Blind right eye WEARS PROSTHESIS Ear/Nose/Mouth/Throat Medical History: Negative for: Chronic sinus problems/congestion; Middle ear  problems Past Medical History Notes: Allergic rhinitis Hematologic/Lymphatic Medical History: Negative for: Anemia; Hemophilia; Human  Immunodeficiency Virus; Lymphedema; Sickle Cell Disease Respiratory Medical History: Positive for: Asthma Negative for: Aspiration; Chronic Obstructive Pulmonary Disease (COPD); Pneumothorax; Sleep Apnea; Tuberculosis Past Medical History Notes: Pneumonia Cardiovascular Medical History: Positive for: Hypertension Negative for: Angina; Arrhythmia; Congestive Heart Failure; Coronary Artery Disease; Deep Vein Thrombosis; Hypotension; Myocardial Infarction; Peripheral Arterial Disease; Peripheral Venous Disease; Phlebitis; Vasculitis Past Medical History Notes: Thoracic aortic aneurysm Thrombocytosis Gastrointestinal Medical History: Positive for: Hepatitis A Negative for: Cirrhosis ; Colitis; Crohns; Hepatitis B; Hepatitis C Endocrine Medical History: Negative for: Type I Diabetes; Type II Diabetes Genitourinary Medical History: Negative for: End Stage Renal Disease Past Medical History Notes: 1/3kidney missing per patient-Renal Cell carcinoma Renal Cyst 5 years ago Immunological Medical History: Negative for: Lupus Erythematosus; Raynauds; Scleroderma Integumentary (Skin) Medical History: Negative for: History of Burn Musculoskeletal Medical History: Positive for: Rheumatoid Arthritis Negative for: Gout; Osteoarthritis; Osteomyelitis Past Medical History Notes: 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 Eczema Neurologic Medical History: Past Medical History Notes: Paraureteric diverticulum Oncologic Medical History: Past Medical History Notes: Renal Cell carcinoma- 5 years ago Psychiatric Medical History: Negative for: Anorexia/bulimia; Confinement Anxiety Immunizations Pneumococcal Vaccine: Received Pneumococcal Vaccination: Yes Received Pneumococcal Vaccination On or After 60th Birthday: Yes Implantable Devices No devices added Hospitalization / Surgery History Type of Hospitalization/Surgery 5 years ago  Renal Cell carcinoma Family and Social History Cancer: Yes - Mother; Diabetes: No; Heart Disease: No; Hypertension: No; Kidney Disease: No; Lung Disease: No; Seizures: No; Stroke: No; Thyroid Problems: No; Tuberculosis: No; Former smoker - quit 40 years ago; Marital Status - Married; Alcohol Use: Never; Drug Use: Current History - marijuana; Caffeine Use: Rarely; Financial Concerns: No; Food, Clothing or Shelter Needs: No; Support System Lacking: No; Transportation Concerns: No Electronic Signature(s) Signed: 11/18/2021 11:12:16 AM By: Brian Shan DO Entered By: Brian Leon on 11/18/2021 11:10:03 -------------------------------------------------------------------------------- SuperBill Details Patient Name: Date of Service: Brian Leon, RO NN P. 11/18/2021 Medical Record Number: [id number] Patient Account Number: [id number] Date of Birth/Sex: Treating RN: 05/14/1946 (76 y.o. [name] Primary Care Provider: Gerlene Leon Other Clinician: Referring Provider: Treating Provider/Extender: Brian Leon in Treatment: 1 Diagnosis Coding ICD-10 Codes Code Description [phone] Non-pressure chronic ulcer of other part of left lower leg with fat layer exposed T14.90XA Injury, unspecified, initial encounter M35.3 Polymyalgia rheumatica Facility Procedures CPT4 Code: [id number] Description: 01561 - DEB SUBQ TISSUE 20 SQ CM/< ICD-10 Diagnosis Description L97.822 Non-pressure chronic ulcer of other part of left lower leg with fat layer expo Modifier: sed Quantity: 1 Physician Procedures : CPT4 Code Description Modifier [id number] 11042 - WC PHYS SUBQ TISS 20 SQ CM ICD-10 Diagnosis Description L97.822 Non-pressure chronic ulcer of other part of left lower leg with fat layer exposed Quantity: 1 Electronic Signature(s) Signed: 11/18/2021 11:12:16 AM By: Brian Shan DO Entered By: Brian Leon on 11/18/2021 11:11:59

## 2021-11-18 NOTE — Progress Notes (Signed)
Brian Leon, CATLIN ([id number]) Visit Report for 11/18/2021 Arrival Information Details Patient Name: Date of Service: Brian Leon, [name] 11/18/2021 9:45 A M Medical Record Number: [id number] Patient Account Number: [id number] Date of Birth/Sex: Treating RN: 1946/01/23 (76 y.o. Brian Leon Primary Care Chasyn Cinque: Gerlene Fee Other Clinician: Referring Kynnedy Carreno: Treating Mallery Harshman/Extender: Hubert Azure in Treatment: 1 Visit Information History Since Last Visit Added or deleted any medications: No Patient Arrived: Ambulatory Any new allergies or adverse reactions: No Arrival Time: 10:15 Had a fall or experienced change in No Transfer Assistance: None activities of daily living that may affect Patient Identification Verified: Yes risk of falls: Secondary Verification Process Completed: Yes Signs or symptoms of abuse/neglect since last visito No Patient Requires Transmission-Based Precautions: No Hospitalized since last visit: No Patient Has Alerts: Yes Implantable device outside of the clinic excluding No Patient Alerts: 11/02/21 L ABI: 1.37 cellular tissue based products placed in the center 11/02/2021 L TBI 0.77 since last visit: Has Dressing in Place as Prescribed: Yes Pain Present Now: No Electronic Signature(s) Signed: 11/18/2021 5:10:40 PM By: Lorrin Jackson Entered By: Lorrin Jackson on 11/18/2021 10:20:28 -------------------------------------------------------------------------------- Encounter Discharge Information Details Patient Name: Date of Service: Brian Leon, RO NN P. 11/18/2021 9:45 A M Medical Record Number: [id number] Patient Account Number: [id number] Date of Birth/Sex: Treating RN: [birth year] (76 y.o. Brian Leon Primary Care Jashaun Penrose: Gerlene Fee Other Clinician: Referring Nyah Shepherd: Treating Arles Rumbold/Extender: Hubert Azure in Treatment: 1 Encounter Discharge Information Items Post  Procedure Vitals Discharge Condition: Stable Temperature (F): 98.7 Ambulatory Status: Ambulatory Pulse (bpm): 87 Discharge Destination: Home Respiratory Rate (breaths/min): 16 Transportation: Private Auto Blood Pressure (mmHg): 153/84 Schedule Follow-up Appointment: Yes Clinical Summary of Care: Provided on 11/18/2021 Form Type Recipient Paper Patient Patient Electronic Signature(s) Signed: 11/18/2021 5:10:40 PM By: Lorrin Jackson Entered By: Lorrin Jackson on 11/18/2021 10:53:23 -------------------------------------------------------------------------------- Lower Extremity Assessment Details Patient Name: Date of Service: Brian Leon, RO NN P. 11/18/2021 9:45 A M Medical Record Number: [id number] Patient Account Number: [id number] Date of Birth/Sex: Treating RN: Dec 23, 1945 (76 y.o. Brian Leon Primary Care Deniyah Dillavou: Gerlene Fee Other Clinician: Referring Mara Favero: Treating Karlis Cregg/Extender: Hubert Azure in Treatment: 1 Edema Assessment Assessed: [name]: Yes] [name]: No] Edema: [Left: N] [Right: o] Calf Left: Right: Point of Measurement: 37 cm From Medial Instep 31 cm Ankle Left: Right: Point of Measurement: 12 cm From Medial Instep 21 cm Vascular Assessment Pulses: Dorsalis Pedis Palpable: [Left:Yes] Electronic Signature(s) Signed: 11/18/2021 5:10:40 PM By: Lorrin Jackson Entered By: Lorrin Jackson on 11/18/2021 10:23:29 -------------------------------------------------------------------------------- Multi Wound Chart Details Patient Name: Date of Service: Brian Leon, RO NN P. 11/18/2021 9:45 A M Medical Record Number: [id number] Patient Account Number: [id number] Date of Birth/Sex: Treating RN: 26-Jan-1946 (76 y.o. M) Primary Care Brian Leon: Gerlene Fee Other Clinician: Referring Houda Brau: Treating Bernie Fobes/Extender: Hubert Azure in Treatment: 1 Vital Signs Height(in): 68 Pulse(bpm):  87 Weight(lbs): 140 Blood Pressure(mmHg): 153/84 Body Mass Index(BMI): 21.3 Temperature(F): 98.7 Respiratory Rate(breaths/min): 16 Photos: [1:Left, Medial Lower Leg] [N/A:N/A N/A] Wound Location: [1:Trauma] [N/A:N/A] Wounding Event: [1:Abrasion] [N/A:N/A] Primary Etiology: [1:Venous Leg Ulcer] [N/A:N/A] Secondary Etiology: [1:Asthma, Hypertension, Hepatitis A, N/A] Comorbid History: [1:Rheumatoid Arthritis 08/02/2021] [N/A:N/A] Date Acquired: [1:1] [N/A:N/A] Weeks of Treatment: [1:Open] [N/A:N/A] Wound Status: [1:No] [N/A:N/A] Wound Recurrence: [1:2.2x1x0.1] [N/A:N/A] Measurements L x W x D (cm) [1:1.728] [N/A:N/A] A (cm) : rea [1:0.173] [N/A:N/A] Volume (cm) : [1:41.60%] [N/A:N/A] % Reduction in A [1:rea: 41.60%] [N/A:N/A] % Reduction in  Volume: [1:Full Thickness Without Exposed] [N/A:N/A] Classification: [1:Support Structures Medium] [N/A:N/A] Exudate A mount: [1:Serosanguineous] [N/A:N/A] Exudate Type: [1:red, brown] [N/A:N/A] Exudate Color: [1:Distinct, outline attached] [N/A:N/A] Wound Margin: [1:Medium (34-66%)] [N/A:N/A] Granulation A mount: [1:Red, Pink, Hyper-granulation] [N/A:N/A] Granulation Quality: [1:Medium (34-66%)] [N/A:N/A] Necrotic A mount: [1:Fat Layer (Subcutaneous Tissue): Yes N/A] Exposed Structures: [1:Fascia: No Tendon: No Muscle: No Joint: No Bone: No Small (1-33%)] [N/A:N/A] Epithelialization: [1:Debridement - Excisional] [N/A:N/A] Debridement: Pre-procedure Verification/Time Out 10:33 [N/A:N/A] Taken: [1:Other] [N/A:N/A] Pain Control: [1:Subcutaneous, Slough] [N/A:N/A] Tissue Debrided: [1:Skin/Subcutaneous Tissue] [N/A:N/A] Level: [1:2.2] [N/A:N/A] Debridement A (sq cm): [1:rea Curette] [N/A:N/A] Instrument: [1:Minimum] [N/A:N/A] Bleeding: [1:Pressure] [N/A:N/A] Hemostasis A chieved: [1:Procedure was tolerated well] [N/A:N/A] Debridement Treatment Response: [1:2.2x1x0.1] [N/A:N/A] Post Debridement Measurements L x W x D (cm) [1:0.173]  [N/A:N/A] Post Debridement Volume: (cm) [1:Debridement] [N/A:N/A] Treatment Notes Wound #1 (Lower Leg) Wound Laterality: Left, Medial Cleanser Soap and Water Discharge Instruction: May shower and wash wound with dial antibacterial soap and water prior to dressing change. Byram Ancillary Kit - 15 Day Supply Discharge Instruction: Use supplies as instructed; Kit contains: (15) Saline Bullets; (15) 3x3 Gauze; 15 pr Gloves Peri-Wound Care Skin Prep Discharge Instruction: Use skin prep as directed Topical Gentamicin Discharge Instruction: Apply directly to wound bed. Primary Dressing Hydrofera Blue Classic Foam, 2x2 in Discharge Instruction: Moisten with saline. Apply over the gentamicin ointment. Secondary Dressing Zetuvit Plus Silicone Border Dressing 4x4 (in/in) Discharge Instruction: Apply silicone border over primary dressing as directed. Secured With Compression Wrap Compression Stockings Add-Ons Electronic Signature(s) Signed: 11/18/2021 11:12:16 AM By: Kalman Shan DO Entered By: Kalman Shan on 11/18/2021 11:09:04 -------------------------------------------------------------------------------- Multi-Disciplinary Care Plan Details Patient Name: Date of Service: Brian Leon, RO NN P. 11/18/2021 9:45 A M Medical Record Number: [id number] Patient Account Number: [id number] Date of Birth/Sex: Treating RN: November 26, 1945 (76 y.o. Brian Leon Primary Care Evyn Kooyman: Gerlene Fee Other Clinician: Referring Eldine Rencher: Treating Tamma Brigandi/Extender: Hubert Azure in Treatment: 1 Active Inactive Orientation to the Wound Care Program Nursing Diagnoses: Knowledge deficit related to the wound healing center program Goals: Patient/caregiver will verbalize understanding of the [facility] Program Date Initiated: 11/11/2021 Target Resolution Date: 12/10/2021 Goal Status: Active Interventions: Provide education on orientation to the wound  center Notes: Pain, Acute or Chronic Nursing Diagnoses: Pain, acute or chronic: actual or potential Potential alteration in comfort, pain Goals: Patient will verbalize adequate pain control and receive pain control interventions during procedures as needed Date Initiated: 11/11/2021 Target Resolution Date: 12/10/2021 Goal Status: Active Patient/caregiver will verbalize comfort level met Date Initiated: 11/11/2021 Target Resolution Date: 12/09/2021 Goal Status: Active Interventions: Encourage patient to take pain medications as prescribed Provide education on pain management Treatment Activities: Administer pain control measures as ordered : 11/11/2021 Notes: Wound/Skin Impairment Nursing Diagnoses: Knowledge deficit related to ulceration/compromised skin integrity Goals: Patient/caregiver will verbalize understanding of skin care regimen Date Initiated: 11/11/2021 Target Resolution Date: 12/10/2021 Goal Status: Active Interventions: Assess patient/caregiver ability to obtain necessary supplies Assess patient/caregiver ability to perform ulcer/skin care regimen upon admission and as needed Provide education on ulcer and skin care Treatment Activities: Skin care regimen initiated : 11/11/2021 Topical wound management initiated : 11/11/2021 Notes: Electronic Signature(s) Signed: 11/18/2021 5:10:40 PM By: Lorrin Jackson Entered By: Lorrin Jackson on 11/18/2021 10:26:24 -------------------------------------------------------------------------------- Pain Assessment Details Patient Name: Date of Service: Brian Leon, [place] NN P. 11/18/2021 9:45 A M Medical Record Number: [id number] Patient Account Number: [id number] Date of Birth/Sex: Treating RN: October 31, 1945 (76 y.o. Brian Leon Primary Care Brittay Mogle: Autry-Lott,  [name] Other Clinician: Referring Lulubelle Simcoe: Treating Kabir Brannock/Extender: Hubert Azure in Treatment: 1 Active Problems Location of Pain Severity and  Description of Pain Patient Has Paino No Site Locations Pain Management and Medication Current Pain Management: Electronic Signature(s) Signed: 11/18/2021 5:10:40 PM By: Lorrin Jackson Entered By: Lorrin Jackson on 11/18/2021 10:21:08 -------------------------------------------------------------------------------- Patient/Caregiver Education Details Patient Name: Date of Service: Brian Leon, RO NN P. 2/16/2023andnbsp9:45 A M Medical Record Number: [id number] Patient Account Number: [id number] Date of Birth/Gender: Treating RN: 01-Sep-1946 (76 y.o. Brian Leon Primary Care Physician: Gerlene Fee Other Clinician: Referring Physician: Treating Physician/Extender: Hubert Azure in Treatment: 1 Education Assessment Education Provided To: Patient Education Topics Provided Wound Debridement: Methods: Explain/Verbal Responses: State content correctly Wound/Skin Impairment: Methods: Demonstration, Explain/Verbal, Printed Responses: State content correctly Electronic Signature(s) Signed: 11/18/2021 5:10:40 PM By: Lorrin Jackson Entered By: Lorrin Jackson on 11/18/2021 10:26:53 -------------------------------------------------------------------------------- Wound Assessment Details Patient Name: Date of Service: Brian Leon, RO NN P. 11/18/2021 9:45 A M Medical Record Number: [id number] Patient Account Number: [id number] Date of Birth/Sex: Treating RN: 11-Jul-1946 (76 y.o. Brian Leon Primary Care Armend Hochstatter: Gerlene Fee Other Clinician: Referring Issac Moure: Treating Bunyan Brier/Extender: Hubert Azure in Treatment: 1 Wound Status Wound Number: 1 Primary Etiology: Abrasion Wound Location: Left, Medial Lower Leg Secondary Etiology: Venous Leg Ulcer Wounding Event: Trauma Wound Status: Open Date Acquired: 08/02/2021 Comorbid History: Asthma, Hypertension, Hepatitis A, Rheumatoid Arthritis Weeks Of Treatment:  1 Clustered Wound: No Photos Wound Measurements Length: (cm) 2.2 Width: (cm) 1 Depth: (cm) 0.1 Area: (cm) 1.728 Volume: (cm) 0.173 % Reduction in Area: 41.6% % Reduction in Volume: 41.6% Epithelialization: Small (1-33%) Tunneling: No Undermining: No Wound Description Classification: Full Thickness Without Exposed Support Structures Wound Margin: Distinct, outline attached Exudate Amount: Medium Exudate Type: Serosanguineous Exudate Color: red, brown Foul Odor After Cleansing: No Slough/Fibrino Yes Wound Bed Granulation Amount: Medium (34-66%) Exposed Structure Granulation Quality: Red, Pink, Hyper-granulation Fascia Exposed: No Necrotic Amount: Medium (34-66%) Fat Layer (Subcutaneous Tissue) Exposed: Yes Necrotic Quality: Adherent Slough Tendon Exposed: No Muscle Exposed: No Joint Exposed: No Bone Exposed: No Treatment Notes Wound #1 (Lower Leg) Wound Laterality: Left, Medial Cleanser Soap and Water Discharge Instruction: May shower and wash wound with dial antibacterial soap and water prior to dressing change. Byram Ancillary Kit - 15 Day Supply Discharge Instruction: Use supplies as instructed; Kit contains: (15) Saline Bullets; (15) 3x3 Gauze; 15 pr Gloves Peri-Wound Care Skin Prep Discharge Instruction: Use skin prep as directed Topical Gentamicin Discharge Instruction: Apply directly to wound bed. Primary Dressing Hydrofera Blue Classic Foam, 2x2 in Discharge Instruction: Moisten with saline. Apply over the gentamicin ointment. Secondary Dressing Zetuvit Plus Silicone Border Dressing 4x4 (in/in) Discharge Instruction: Apply silicone border over primary dressing as directed. Secured With Compression Wrap Compression Stockings Environmental education officer) Signed: 11/18/2021 5:10:40 PM By: Lorrin Jackson Entered By: Lorrin Jackson on 11/18/2021 10:25:22 -------------------------------------------------------------------------------- Vitals  Details Patient Name: Date of Service: Brian Leon, RO NN P. 11/18/2021 9:45 A M Medical Record Number: [id number] Patient Account Number: [id number] Date of Birth/Sex: Treating RN: 18-Jan-1946 (76 y.o. Brian Leon Primary Care Arlone Lenhardt: Gerlene Fee Other Clinician: Referring Alithia Zavaleta: Treating Tyla Burgner/Extender: Hubert Azure in Treatment: 1 Vital Signs Time Taken: 10:20 Temperature (F): 98.7 Height (in): 68 Pulse (bpm): 87 Weight (lbs): 140 Respiratory Rate (breaths/min): 16 Body Mass Index (BMI): 21.3 Blood Pressure (mmHg): 153/84 Reference Range: 80 - 120 mg / dl Electronic Signature(s) Signed: 11/18/2021 5:10:40 PM By:  Lorrin Jackson Entered By: Lorrin Jackson on 11/18/2021 10:20:53

## 2021-11-23 NOTE — Progress Notes (Signed)
° ° °  SUBJECTIVE:   CHIEF COMPLAINT / HPI:   Follow up, left leg wound Here for follow up. Doing well. No other concerns. Feel as if it has continued to heal since seeing wound care. He states he has all the supplies he needs and has follow up with wound care tomorrow. Denies fever and leg pain.   PERTINENT  PMH / PSH: HLD, Thrombocytosis, BCC  OBJECTIVE:   BP (!) 148/85    Pulse 80    Wt 145 lb 9.6 oz (66 kg)    SpO2 100%    BMI 20.89 kg/m     Media Information Document Information  Photos    11/24/2021 13:48  Attached To:  Office Visit on 11/24/21 with Autry-Lott, [name], DO   Source Information  Autry-Lott, [name], DO   [facility] Res[facility]    ASSESSMENT/PLAN:   Venous stasis ulcer of other part of left lower leg, unspecified ulcer stage, unspecified whether varicose veins present [facility]) Here for follow up. Reviewed visit with Dr. Chauncey Reading 2/3. Appears to be in the beginning stages of healing. Encouraged to continue wound care and will place a referral for further evaluation by [facility] given the above PMHx.  - Ambulatory referral to Vascular Surgery  Gerlene Fee, [place]

## 2021-11-23 NOTE — Patient Instructions (Addendum)
It was wonderful to see you today.  Please bring ALL of your medications with you to every visit.   Today we talked about:  -Follow-up with wound care - Following up with cardiology on starting an ARB - Continue statin medication and consider decreasing diet in heavy creams and whole milk.  Please be sure to schedule follow up at the front  desk before you leave today.   If you haven't already, sign up for My Chart to have easy access to your labs results, and communication with your primary care physician.  Please call the clinic at [phone] if your symptoms worsen or you have any concerns. It was our pleasure to serve you.  Dr. Janus Molder

## 2021-11-24 ENCOUNTER — Other Ambulatory Visit: Payer: Self-pay

## 2021-11-24 ENCOUNTER — Ambulatory Visit (INDEPENDENT_AMBULATORY_CARE_PROVIDER_SITE_OTHER): Payer: Medicare Other | Admitting: Family Medicine

## 2021-11-24 VITALS — BP 148/85 | HR 80 | Wt 145.6 lb

## 2021-11-24 DIAGNOSIS — L97829 Non-pressure chronic ulcer of other part of left lower leg with unspecified severity: Secondary | ICD-10-CM

## 2021-11-24 DIAGNOSIS — I83028 Varicose veins of left lower extremity with ulcer other part of lower leg: Secondary | ICD-10-CM | POA: Diagnosis not present

## 2021-11-25 ENCOUNTER — Telehealth: Payer: Self-pay | Admitting: Cardiology

## 2021-11-25 ENCOUNTER — Telehealth: Payer: Self-pay | Admitting: Family Medicine

## 2021-11-25 ENCOUNTER — Encounter (HOSPITAL_BASED_OUTPATIENT_CLINIC_OR_DEPARTMENT_OTHER): Payer: Medicare Other | Admitting: Internal Medicine

## 2021-11-25 DIAGNOSIS — M353 Polymyalgia rheumatica: Secondary | ICD-10-CM | POA: Diagnosis not present

## 2021-11-25 DIAGNOSIS — T1490XA Injury, unspecified, initial encounter: Secondary | ICD-10-CM | POA: Diagnosis not present

## 2021-11-25 DIAGNOSIS — L97822 Non-pressure chronic ulcer of other part of left lower leg with fat layer exposed: Secondary | ICD-10-CM

## 2021-11-25 DIAGNOSIS — I10 Essential (primary) hypertension: Secondary | ICD-10-CM

## 2021-11-25 DIAGNOSIS — Z87891 Personal history of nicotine dependence: Secondary | ICD-10-CM | POA: Diagnosis not present

## 2021-11-25 NOTE — Telephone Encounter (Signed)
Pt is calling about the BP medication. Below is your last office note:  Essential hypertension blood pressure elevated we will recheck his blood pressure before he get out of the room, will check Chem-7 with anticipation to ARB.  11/03/2021  5:38 AM - Interface, [facility] Lab Results In  Component Value Flag Ref Range Units Status  Glucose 79   70 - 99 mg/dL Final  BUN 16   8 - 27 mg/dL Final  Creatinine, Ser 0.93   0.76 - 1.27 mg/dL Final  eGFR 85   >59 mL/min/1.73 Final  BUN/Creatinine Ratio 17   10 - 24  Final  Sodium 141   134 - 144 mmol/L Final  Potassium 4.1   3.5 - 5.2 mmol/L Final  Chloride 107   High   96 - 106 mmol/L Final  CO2 22   20 - 29 mmol/L Final  Calcium 9.2        How do you advise?

## 2021-11-25 NOTE — Telephone Encounter (Signed)
Discussed [facility] referral with patient.  Gerlene Fee, DO 11/25/2021, 5:41 PM PGY-3, [place]

## 2021-11-25 NOTE — Telephone Encounter (Signed)
Pt c/o medication issue:  1. Name of Medications:  atorvastatin (LIPITOR) 10 MG tablet "Blood pressure medication"  2. How are you currently taking this medication (dosage and times per day)? Patient not taking either  3. Are you having a reaction (difficulty breathing--STAT)?   4. What is your medication issue? Patient said neither the statin nor the blood pressure medication were received by his pharmacy

## 2021-11-25 NOTE — Progress Notes (Signed)
Brian Leon, Brian Leon ([id number]) Visit Report for 11/25/2021 Chief Complaint Document Details Patient Name: Date of Service: Brian Leon, [name] 11/25/2021 10:15 A M Medical Record Number: [id number] Patient Account Number: [id number] Date of Birth/Sex: Treating RN: [birth year] (76 y.o. M) Primary Care Provider: Gerlene Leon Other Clinician: Referring Provider: Treating Provider/Extender: Brian Leon in Treatment: 2 Information Obtained from: Patient Chief Complaint Left lower extremity wound due to trauma Electronic Signature(s) Signed: 11/25/2021 10:47:32 AM By: Brian Shan DO Entered By: Brian Leon on 11/25/2021 10:42:54 -------------------------------------------------------------------------------- Debridement Details Patient Name: Date of Service: Brian Leon, RO NN P. 11/25/2021 10:15 A M Medical Record Number: [id number] Patient Account Number: [id number] Date of Birth/Sex: Treating RN: 12-28-1945 (76 y.o. Brian Leon, Brian Leon Primary Care Provider: Gerlene Leon Other Clinician: Referring Provider: Treating Provider/Extender: Brian Leon in Treatment: 2 Debridement Performed for Assessment: Wound #1 Left,Medial Lower Leg Performed By: Physician Brian Shan, DO Debridement Type: Debridement Severity of Tissue Pre Debridement: Fat layer exposed Level of Consciousness (Pre-procedure): Awake and Alert Pre-procedure Verification/Time Out Yes - 10:30 Taken: Start Time: 10:31 Pain Control: Lidocaine 5% topical ointment T Area Debrided (L x W): otal 2.6 (cm) x 0.8 (cm) = 2.08 (cm) Tissue and other material debrided: Viable, Non-Viable, Slough, Subcutaneous, Fibrin/Exudate, Slough Level: Skin/Subcutaneous Tissue Debridement Description: Excisional Instrument: Curette Bleeding: Minimum Hemostasis Achieved: Pressure End Time: 10:37 Procedural Pain: 0 Post Procedural Pain: 0 Response to Treatment:  Procedure was tolerated well Level of Consciousness (Post- Awake and Alert procedure): Post Debridement Measurements of Total Wound Length: (cm) 2.6 Width: (cm) 0.8 Depth: (cm) 0.1 Volume: (cm) 0.163 Character of Wound/Ulcer Post Debridement: Improved Severity of Tissue Post Debridement: Fat layer exposed Post Procedure Diagnosis Same as Pre-procedure Electronic Signature(s) Signed: 11/25/2021 10:47:32 AM By: Brian Shan DO Signed: 11/25/2021 5:46:52 PM By: Brian Pilling RN, BSN Entered By: Brian Leon on 11/25/2021 10:38:42 -------------------------------------------------------------------------------- HPI Details Patient Name: Date of Service: Brian Leon, RO NN P. 11/25/2021 10:15 A M Medical Record Number: [id number] Patient Account Number: [id number] Date of Birth/Sex: Treating RN: [birth year] (76 y.o. M) Primary Care Provider: Gerlene Leon Other Clinician: Referring Provider: Treating Provider/Extender: Brian Leon in Treatment: 2 History of Present Illness HPI Description: Admission 11/11/2021 Brian Leon is a 76 year old male with a past medical history of polymyalgia rheumatica that presents to the clinic for a 87-monthhistory of nonhealing wound to the left lower extremity. He states that on Halloween he hit an object and created a wound that has not healed. He states he has seen dermatology and his primary care physician for this issue. On one occasion he had an UHaematologist He has also used Xeroform. It does not sound like he uses any kind of wound dressing consistently. He currently reports chronic pain to the wound site. He denies systemic signs of infection. He denies purulent drainage. 2/16; patient presents for follow-up. He has been using Hydrofera Blue and gentamicin ointment to the wound bed without issues. He reports improvement in wound healing. He currently denies signs of infection. 2/23; patient presents for follow-up.  He continues to use Hydrofera Blue and gentamicin ointment to the wound bed without issues. He denies signs of infection. He reports improvement in his chronic pain to the wound bed. Electronic Signature(s) Signed: 11/25/2021 10:47:32 AM By: HKalman ShanDO Entered By: HKalman Shanon 11/25/2021 10:43:50 -------------------------------------------------------------------------------- Physical Exam Details Patient Name: Date of Service: [name] RO NN P. 11/25/2021 10:15  A M Medical Record Number: [id number] Patient Account Number: [id number] Date of Birth/Sex: Treating RN: [birth year] (76 y.o. M) Primary Care Provider: Gerlene Leon Other Clinician: Referring Provider: Treating Provider/Extender: Brian Leon in Treatment: 2 Constitutional respirations regular, non-labored and within target range for patient.. Cardiovascular 2+ dorsalis pedis/posterior tibialis pulses. Psychiatric pleasant and cooperative. Notes Left lower extremity: Open wound to the anterior aspect with granulation tissue and nonviable tissue present. No surrounding signs of infection. Appears well- healing. No pain to the wound bed on palpation. Electronic Signature(s) Signed: 11/25/2021 10:47:32 AM By: Brian Shan DO Entered By: Brian Leon on 11/25/2021 10:44:20 -------------------------------------------------------------------------------- Physician Orders Details Patient Name: Date of Service: Brian Leon, RO NN P. 11/25/2021 10:15 A M Medical Record Number: [id number] Patient Account Number: [id number] Date of Birth/Sex: Treating RN: 04-Jul-1946 (76 y.o. Brian Leon Primary Care Provider: Gerlene Leon Other Clinician: Referring Provider: Treating Provider/Extender: Brian Leon in Treatment: 2 Verbal / Phone Orders: No Diagnosis Coding ICD-10 Coding Code Description [phone] Non-pressure chronic ulcer of other part  of left lower leg with fat layer exposed T14.90XA Injury, unspecified, initial encounter M35.3 Polymyalgia rheumatica Follow-up Appointments ppointment in 1 week. - Dr. Heber Leon and Brian Anna RN, Room 7 Return A Other: Brian Leon DME-wound care products. Bathing/ Shower/ Hygiene May shower with protection but do not get wound dressing(s) wet. - when not changing the dressing. May shower and wash wound with soap and water. - with dressing changes. Edema Control - Lymphedema / SCD / Other Elevate legs to the level of the heart or above for 30 minutes daily and/or when sitting, a frequency of: - 3-4 times a day throughout the day. Avoid standing for long periods of time. Exercise regularly Moisturize legs daily. - both legs every night before bed. Wound Treatment Wound #1 - Lower Leg Wound Laterality: Left, Medial Cleanser: Soap and Water Every Other Day/30 Days Discharge Instructions: May shower and wash wound with dial antibacterial soap and water prior to dressing change. Cleanser: Byram Ancillary Kit - 15 Day Supply (Generic) Every Other Day/30 Days Discharge Instructions: Use supplies as instructed; Kit contains: (15) Saline Bullets; (15) 3x3 Gauze; 15 pr Gloves Peri-Wound Care: Skin Prep (Generic) Every Other Day/30 Days Discharge Instructions: Use skin prep as directed Topical: Gentamicin Every Other Day/30 Days Discharge Instructions: Apply directly to wound bed. Prim Dressing: Hydrofera Blue Classic Foam, 2x2 in Every Other Day/30 Days ary Discharge Instructions: Moisten with saline. Apply over the gentamicin ointment. Prim Dressing: Santyl Ointment Every Other Day/30 Days ary Discharge Instructions: ***APPLY SANTYL IN CLINIC ONLY.*** Secondary Dressing: Zetuvit Plus Silicone Border Dressing 4x4 (in/in) (Generic) Every Other Day/30 Days Discharge Instructions: Apply silicone border over primary dressing as directed. Electronic Signature(s) Signed: 11/25/2021 10:47:32 AM By: Brian Shan DO Entered By: Brian Leon on 11/25/2021 10:44:41 -------------------------------------------------------------------------------- Problem List Details Patient Name: Date of Service: Brian Leon, RO NN P. 11/25/2021 10:15 A M Medical Record Number: [id number] Patient Account Number: [id number] Date of Birth/Sex: Treating RN: 1946/03/18 (76 y.o. Brian Leon Primary Care Provider: Gerlene Leon Other Clinician: Referring Provider: Treating Provider/Extender: Brian Leon in Treatment: 2 Active Problems ICD-10 Encounter Code Description Active Date MDM Diagnosis [phone] Non-pressure chronic ulcer of other part of left lower leg with fat layer exposed2/06/2022 No Yes T14.90XA Injury, unspecified, initial encounter 11/11/2021 No Yes M35.3 Polymyalgia rheumatica 11/11/2021 No Yes Inactive Problems Resolved Problems Electronic Signature(s) Signed: 11/25/2021 10:47:32 AM By: Brian Shan DO Entered By:  Brian Leon on 11/25/2021 10:42:29 -------------------------------------------------------------------------------- Progress Note Details Patient Name: Date of Service: Brian Leon, [name] 11/25/2021 10:15 A M Medical Record Number: [id number] Patient Account Number: [id number] Date of Birth/Sex: Treating RN: [birth year] (76 y.o. M) Primary Care Provider: Gerlene Leon Other Clinician: Referring Provider: Treating Provider/Extender: Brian Leon in Treatment: 2 Subjective Chief Complaint Information obtained from Patient Left lower extremity wound due to trauma History of Present Illness (HPI) Admission 11/11/2021 Mr. Wm Demaree is a 76 year old male with a past medical history of polymyalgia rheumatica that presents to the clinic for a 79-monthhistory of nonhealing wound to the left lower extremity. He states that on Halloween he hit an object and created a wound that has not healed. He states he  has seen dermatology and his primary care physician for this issue. On one occasion he had an UHaematologist He has also used Xeroform. It does not sound like he uses any kind of wound dressing consistently. He currently reports chronic pain to the wound site. He denies systemic signs of infection. He denies purulent drainage. 2/16; patient presents for follow-up. He has been using Hydrofera Blue and gentamicin ointment to the wound bed without issues. He reports improvement in wound healing. He currently denies signs of infection. 2/23; patient presents for follow-up. He continues to use Hydrofera Blue and gentamicin ointment to the wound bed without issues. He denies signs of infection. He reports improvement in his chronic pain to the wound bed. Patient History Information obtained from Chart. Family History Cancer - Mother, No family history of Diabetes, Heart Disease, Hypertension, Kidney Disease, Lung Disease, Seizures, Stroke, Thyroid Problems, Tuberculosis. Social History Former smoker - quit 40 years ago, Marital Status - Married, Alcohol Use - Never, Drug Use - Current History - marijuana, Caffeine Use - Rarely. Medical History Eyes Denies history of Cataracts, Glaucoma, Optic Neuritis Ear/Nose/Mouth/Throat Denies history of Chronic sinus problems/congestion, Middle ear problems Hematologic/Lymphatic Denies history of Anemia, Hemophilia, Human Immunodeficiency Virus, Lymphedema, Sickle Cell Disease Respiratory Patient has history of Asthma Denies history of Aspiration, Chronic Obstructive Pulmonary Disease (COPD), Pneumothorax, Sleep Apnea, Tuberculosis Cardiovascular Patient has history of Hypertension Denies history of Angina, Arrhythmia, Congestive Heart Failure, Coronary Artery Disease, Deep Vein Thrombosis, Hypotension, Myocardial Infarction, Peripheral Arterial Disease, Peripheral Venous Disease, Phlebitis, Vasculitis Gastrointestinal Patient has history of Hepatitis  A Denies history of Cirrhosis , Colitis, Crohnoos, Hepatitis B, Hepatitis C Endocrine Denies history of Type I Diabetes, Type II Diabetes Genitourinary Denies history of End Stage Renal Disease Immunological Denies history of Lupus Erythematosus, Raynaudoos, Scleroderma Integumentary (Skin) Denies history of History of Burn Musculoskeletal Patient has history of Rheumatoid Arthritis Denies history of Gout, Osteoarthritis, Osteomyelitis Psychiatric Denies history of Anorexia/bulimia, Confinement Anxiety Hospitalization/Surgery History - 5 years ago Renal Cell carcinoma. 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 Vitals Time Taken: 10:23 AM, Height: 68 in, Weight: 140 lbs, BMI: 21.3, Temperature: 97.9 F, Pulse: 64 bpm, Respiratory Rate: 20 breaths/min, Blood Pressure: 156/84 mmHg. Cardiovascular 2+ dorsalis pedis/posterior tibialis pulses. Psychiatric pleasant and cooperative. General Notes: Left lower extremity: Open wound to the anterior aspect with granulation tissue and nonviable tissue  present. No surrounding signs of infection. Appears well-healing. No pain to the wound bed on palpation. Integumentary (Hair, Skin) Wound #1 status is Open. Original cause of wound was Trauma. The date acquired was: 08/02/2021. The wound has been in treatment 2 weeks. The wound is located on the Left,Medial Lower Leg. The wound measures 2.6cm length x 0.8cm width x  0.1cm depth; 1.634cm^2 area and 0.163cm^3 volume. There is Fat Layer (Subcutaneous Tissue) exposed. There is no tunneling or undermining noted. There is a medium amount of serosanguineous drainage noted. The wound margin is distinct with the outline attached to the wound base. There is medium (34-66%) red, pink granulation within the wound bed. There is a medium (34-66%) amount of necrotic tissue within the wound bed including Adherent Slough. Assessment Active Problems ICD-10 Non-pressure chronic ulcer of other part of left lower leg with fat layer exposed Injury, unspecified, initial encounter Polymyalgia rheumatica Patient's wound shows improvement in size and appearance since last clinic visit. Instead of 1 large wound he has multiple very small wounds present. I debrided nonviable tissue. No signs of infection. I recommended continuing with gentamicin and Hydrofera Blue. Follow-up in 1 week Procedures Wound #1 Pre-procedure diagnosis of Wound #1 is an Abrasion located on the Left,Medial Lower Leg .Severity of Tissue Pre Debridement is: Fat layer exposed. There was a Excisional Skin/Subcutaneous Tissue Debridement with a total area of 2.08 sq cm performed by Brian Shan, DO. With the following instrument(s): Curette to remove Viable and Non-Viable tissue/material. Material removed includes Subcutaneous Tissue, Slough, and Fibrin/Exudate after achieving pain control using Lidocaine 5% topical ointment. A time out was conducted at 10:30, prior to the start of the procedure. A Minimum amount of bleeding was controlled with Pressure. The procedure was tolerated well with a pain level of 0 throughout and a pain level of 0 following the procedure. Post Debridement Measurements: 2.6cm length x 0.8cm width x 0.1cm depth; 0.163cm^3 volume. Character of Wound/Ulcer Post Debridement is improved. Severity of Tissue Post Debridement is: Fat layer exposed. Post procedure Diagnosis Wound #1: Same as  Pre-Procedure Plan Follow-up Appointments: Return Appointment in 1 week. - Dr. Heber Greenfield and Brian Anna RN, Room 7 Other: Brian Leon DME-wound care products. Bathing/ Shower/ Hygiene: May shower with protection but do not get wound dressing(s) wet. - when not changing the dressing. May shower and wash wound with soap and water. - with dressing changes. Edema Control - Lymphedema / SCD / Other: Elevate legs to the level of the heart or above for 30 minutes daily and/or when sitting, a frequency of: - 3-4 times a day throughout the day. Avoid standing for long periods of time. Exercise regularly Moisturize legs daily. - both legs every night before bed. WOUND #1: - Lower Leg Wound Laterality: Left, Medial Cleanser: Soap and Water Every Other Day/30 Days Discharge Instructions: May shower and wash wound with dial antibacterial soap and water prior to dressing change. Cleanser: Byram Ancillary Kit - 15 Day Supply (Generic) Every Other Day/30 Days Discharge Instructions: Use supplies as instructed; Kit contains: (15) Saline Bullets; (15) 3x3 Gauze; 15 pr Gloves Peri-Wound Care: Skin Prep (Generic) Every Other Day/30 Days Discharge Instructions: Use skin prep as directed Topical: Gentamicin Every Other Day/30 Days Discharge Instructions: Apply directly to wound bed. Prim Dressing: Hydrofera Blue Classic Foam, 2x2 in Every Other Day/30 Days ary Discharge Instructions: Moisten with saline. Apply over the gentamicin ointment. Prim Dressing: Santyl Ointment Every Other Day/30 Days ary Discharge Instructions: ***APPLY SANTYL IN CLINIC ONLY.*** Secondary Dressing:  Zetuvit Plus Silicone Border Dressing 4x4 (in/in) (Generic) Every Other Day/30 Days Discharge Instructions: Apply silicone border over primary dressing as directed. 1. In office sharp debridement 2. Hydrofera Blue and gentamicin Ointment 3. Follow-up in 1 week Electronic Signature(s) Signed: 11/25/2021 10:47:32 AM By: Brian Shan DO Signed:  11/25/2021 10:47:32 AM By: Brian Shan DO Entered By: Brian Leon on 11/25/2021 10:46:23 -------------------------------------------------------------------------------- HxROS Details Patient Name: Date of Service: Brian Leon, RO NN P. 11/25/2021 10:15 A M Medical Record Number: [id number] Patient Account Number: [id number] Date of Birth/Sex: Treating RN: [birth year] (76 y.o. M) Primary Care Provider: Gerlene Leon Other Clinician: Referring Provider: Treating Provider/Extender: Brian Leon in Treatment: 2 Information Obtained From Chart Eyes Medical History: Negative for: Cataracts; Glaucoma; Optic Neuritis Past Medical History Notes: Blind right eye WEARS PROSTHESIS Ear/Nose/Mouth/Throat Medical History: Negative for: Chronic sinus problems/congestion; Middle ear problems Past Medical History Notes: Allergic rhinitis Hematologic/Lymphatic Medical History: Negative for: Anemia; Hemophilia; Human Immunodeficiency Virus; Lymphedema; Sickle Cell Disease Respiratory Medical History: Positive for: Asthma Negative for: Aspiration; Chronic Obstructive Pulmonary Disease (COPD); Pneumothorax; Sleep Apnea; Tuberculosis Past Medical History Notes: Pneumonia Cardiovascular Medical History: Positive for: Hypertension Negative for: Angina; Arrhythmia; Congestive Heart Failure; Coronary Artery Disease; Deep Vein Thrombosis; Hypotension; Myocardial Infarction; Peripheral Arterial Disease; Peripheral Venous Disease; Phlebitis; Vasculitis Past Medical History Notes: Thoracic aortic aneurysm Thrombocytosis Gastrointestinal Medical History: Positive for: Hepatitis A Negative for: Cirrhosis ; Colitis; Crohns; Hepatitis B; Hepatitis C Endocrine Medical History: Negative for: Type I Diabetes; Type II Diabetes Genitourinary Medical History: Negative for: End Stage Renal Disease Past Medical History Notes: 1/3kidney missing per patient-Renal Cell  carcinoma Renal Cyst 5 years ago Immunological Medical History: Negative for: Lupus Erythematosus; Raynauds; Scleroderma Integumentary (Skin) Medical History: Negative for: History of Burn Musculoskeletal Medical History: Positive for: Rheumatoid Arthritis Negative for: Gout; Osteoarthritis; Osteomyelitis Past Medical History Notes: 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 Eczema Neurologic Medical History: Past Medical History Notes: Paraureteric diverticulum Oncologic Medical History: Past Medical History Notes: Renal Cell carcinoma- 5 years ago Psychiatric Medical History: Negative for: Anorexia/bulimia; Confinement Anxiety Immunizations Pneumococcal Vaccine: Received Pneumococcal Vaccination: Yes Received Pneumococcal Vaccination On or After 60th Birthday: Yes Implantable Devices No devices added Hospitalization / Surgery History Type of Hospitalization/Surgery 5 years ago Renal Cell carcinoma Family and Social History Cancer: Yes - Mother; Diabetes: No; Heart Disease: No; Hypertension: No; Kidney Disease: No; Lung Disease: No; Seizures: No; Stroke: No; Thyroid Problems: No; Tuberculosis: No; Former smoker - quit 40 years ago; Marital Status - Married; Alcohol Use: Never; Drug Use: Current History - marijuana; Caffeine Use: Rarely; Financial Concerns: No; Food, Clothing or Shelter Needs: No; Support System Lacking: No; Transportation Concerns: No Electronic Signature(s) Signed: 11/25/2021 10:47:32 AM By: Brian Shan DO Entered By: Brian Leon on 11/25/2021 10:43:59 -------------------------------------------------------------------------------- SuperBill Details Patient Name: Date of Service: Brian Leon, RO NN P. 11/25/2021 Medical Record Number: [id number] Patient Account Number: [id number] Date of Birth/Sex: Treating RN: 16-Feb-1946 (76 y.o. Brian Leon Primary Care Provider: Gerlene Leon Other  Clinician: Referring Provider: Treating Provider/Extender: Brian Leon in Treatment: 2 Diagnosis Coding ICD-10 Codes Code Description [phone] Non-pressure chronic ulcer of other part of left lower leg with fat layer exposed T14.90XA Injury, unspecified, initial encounter M35.3 Polymyalgia rheumatica Facility Procedures CPT4 Code: [id number] Description: 00923 - DEB SUBQ TISSUE 20 SQ CM/< ICD-10 Diagnosis Description L97.822 Non-pressure chronic ulcer of other part of left lower leg with fat layer expo Modifier: sed Quantity: 1 Physician  Procedures : CPT4 Code Description Modifier [id number] - WC PHYS SUBQ TISS 20 SQ CM ICD-10 Diagnosis Description L97.822 Non-pressure chronic ulcer of other part of left lower leg with fat layer exposed Quantity: 1 Electronic Signature(s) Signed: 11/25/2021 10:47:32 AM By: Brian Shan DO Entered By: Brian Leon on 11/25/2021 10:46:34

## 2021-11-26 NOTE — Telephone Encounter (Signed)
Left vm for pt to callback 

## 2021-11-30 MED ORDER — LOSARTAN POTASSIUM 50 MG PO TABS: 50.0000 mg | ORAL_TABLET | Freq: Every day | ORAL | 1 refills | Status: DC

## 2021-11-30 NOTE — Addendum Note (Signed)
Addended by: Darrel Reach on: 11/30/2021 04:29 PM   Modules accepted: Orders

## 2021-11-30 NOTE — Telephone Encounter (Signed)
Patient is returning call.  °

## 2021-11-30 NOTE — Telephone Encounter (Signed)
Return patient call, informed of the following per Dr. Raliegh Ip. Agree with plan he said as far as blood work, he will be out of town next week for several weeks and want to know if he should wait on his meds. I advise the patient to continue as plan with his medication and I will send a message to Dr. Raliegh Ip. Patient verbalized understanding.

## 2021-12-01 NOTE — Progress Notes (Signed)
QUILL, GRINDER ([id number]) Visit Report for 11/25/2021 Arrival Information Details Patient Name: Date of Service: Brian Leon, [name] 11/25/2021 10:15 A M Medical Record Number: [id number] Patient Account Number: [id number] Date of Birth/Sex: Treating RN: [birth year] (76 y.o. Brian Leon, Brian Leon Primary Care Brian Leon: Brian Leon Other Clinician: Referring Brian Leon: Treating Brian Leon/Extender: Brian Leon in Treatment: 2 Visit Information History Since Last Visit Added or deleted any medications: No Patient Arrived: Ambulatory Any new allergies or adverse reactions: No Arrival Time: 10:23 Had a fall or experienced change in No Accompanied By: self activities of daily living that may affect Transfer Assistance: None risk of falls: Patient Identification Verified: Yes Signs or symptoms of abuse/neglect since last visito No Secondary Verification Process Completed: Yes Hospitalized since last visit: No Patient Requires Transmission-Based Precautions: No Implantable device outside of the clinic excluding No Patient Has Alerts: Yes cellular tissue based products placed in the center Patient Alerts: 11/02/21 L ABI: 1.37 since last visit: 11/02/2021 L TBI 0.77 Has Dressing in Place as Prescribed: Yes Pain Present Now: No Electronic Signature(s) Signed: 11/25/2021 5:46:52 PM By: Brian Pilling RN, BSN Entered By: Brian Leon on 11/25/2021 10:23:47 -------------------------------------------------------------------------------- Encounter Discharge Information Details Patient Name: Date of Service: Brian Leon, RO NN P. 11/25/2021 10:15 A M Medical Record Number: [id number] Patient Account Number: [id number] Date of Birth/Sex: Treating RN: 1946/09/26 (76 y.o. Brian Leon Primary Care Brian Leon: Brian Leon Other Clinician: Referring Ashleymarie Granderson: Treating Brian Leon/Extender: Brian Leon in Treatment: 2 Encounter  Discharge Information Items Post Procedure Vitals Discharge Condition: Stable Temperature (F): 97.9 Ambulatory Status: Ambulatory Pulse (bpm): 64 Discharge Destination: Home Respiratory Rate (breaths/min): 20 Transportation: Private Auto Blood Pressure (mmHg): 156/84 Accompanied By: self Schedule Follow-up Appointment: Yes Clinical Summary of Care: Electronic Signature(s) Signed: 11/25/2021 5:46:52 PM By: Brian Pilling RN, BSN Entered By: Brian Leon on 11/25/2021 10:41:44 -------------------------------------------------------------------------------- Lower Extremity Assessment Details Patient Name: Date of Service: Brian Leon, RO NN P. 11/25/2021 10:15 A M Medical Record Number: [id number] Patient Account Number: [id number] Date of Birth/Sex: Treating RN: 1946-03-14 (76 y.o. Brian Leon Primary Care Razi Hickle: Brian Leon Other Clinician: Referring Charlena Leon: Treating Brian Leon/Extender: Brian Leon in Treatment: 2 Edema Assessment Assessed: [name]: Yes] Brian Leon: No] Edema: [Left: N] [Right: o] Calf Left: Right: Point of Measurement: 37 cm From Medial Instep 30 cm Ankle Left: Right: Point of Measurement: 12 cm From Medial Instep 20.5 cm Vascular Assessment Pulses: Dorsalis Pedis Palpable: [Left:Yes] Electronic Signature(s) Signed: 11/25/2021 5:46:52 PM By: Brian Pilling RN, BSN Entered By: Brian Leon on 11/25/2021 10:26:44 -------------------------------------------------------------------------------- Multi Wound Chart Details Patient Name: Date of Service: Brian Leon, RO NN P. 11/25/2021 10:15 A M Medical Record Number: [id number] Patient Account Number: [id number] Date of Birth/Sex: Treating RN: 03/09/1946 (77 y.o. M) Primary Care Brian Leon: Brian Leon Other Clinician: Referring Alleyah Twombly: Treating Brian Leon/Extender: Brian Leon in Treatment: 2 Vital Signs Height(in): 68 Pulse(bpm):  64 Weight(lbs): 140 Blood Pressure(mmHg): 156/84 Body Mass Index(BMI): 21.3 Temperature(F): 97.9 Respiratory Rate(breaths/min): 20 Photos: [1:Left, Medial Lower Leg] [N/A:N/A N/A] Wound Location: [1:Trauma] [N/A:N/A] Wounding Event: [1:Abrasion] [N/A:N/A] Primary Etiology: [1:Venous Leg Ulcer] [N/A:N/A] Secondary Etiology: [1:Asthma, Hypertension, Hepatitis A, N/A] Comorbid History: [1:Rheumatoid Arthritis 08/02/2021] [N/A:N/A] Date Acquired: [1:2] [N/A:N/A] Weeks of Treatment: [1:Open] [N/A:N/A] Wound Status: [1:No] [N/A:N/A] Wound Recurrence: [1:2.6x0.8x0.1] [N/A:N/A] Measurements L x W x D (cm) [1:1.634] [N/A:N/A] A (cm) : rea [1:0.163] [N/A:N/A] Volume (cm) : [1:44.80%] [N/A:N/A] % Reduction in A [1:rea: 44.90%] [N/A:N/A] %  Reduction in Volume: [1:Full Thickness Without Exposed] [N/A:N/A] Classification: [1:Support Structures Medium] [N/A:N/A] Exudate A mount: [1:Serosanguineous] [N/A:N/A] Exudate Type: [1:red, brown] [N/A:N/A] Exudate Color: [1:Distinct, outline attached] [N/A:N/A] Wound Margin: [1:Medium (34-66%)] [N/A:N/A] Granulation A mount: [1:Red, Pink] [N/A:N/A] Granulation Quality: [1:Medium (34-66%)] [N/A:N/A] Necrotic A mount: [1:Fat Layer (Subcutaneous Tissue): Yes N/A] Exposed Structures: [1:Fascia: No Tendon: No Muscle: No Joint: No Bone: No Medium (34-66%)] [N/A:N/A] Epithelialization: [1:Debridement - Excisional] [N/A:N/A] Debridement: Pre-procedure Verification/Time Out 10:30 [N/A:N/A] Taken: [1:Lidocaine 5% topical ointment] [N/A:N/A] Pain Control: [1:Subcutaneous, Slough] [N/A:N/A] Tissue Debrided: [1:Skin/Subcutaneous Tissue] [N/A:N/A] Level: [1:2.08] [N/A:N/A] Debridement A (sq cm): [1:rea Curette] [N/A:N/A] Instrument: [1:Minimum] [N/A:N/A] Bleeding: [1:Pressure] [N/A:N/A] Hemostasis A chieved: [1:0] [N/A:N/A] Procedural Pain: [1:0] [N/A:N/A] Post Procedural Pain: [1:Procedure was tolerated well] [N/A:N/A] Debridement Treatment Response:  [1:2.6x0.8x0.1] [N/A:N/A] Post Debridement Measurements L x W x D (cm) [1:0.163] [N/A:N/A] Post Debridement Volume: (cm) [1:Debridement] [N/A:N/A] Treatment Notes Wound #1 (Lower Leg) Wound Laterality: Left, Medial Cleanser Soap and Water Discharge Instruction: May shower and wash wound with dial antibacterial soap and water prior to dressing change. Byram Ancillary Kit - 15 Day Supply Discharge Instruction: Use supplies as instructed; Kit contains: (15) Saline Bullets; (15) 3x3 Gauze; 15 pr Gloves Peri-Wound Care Skin Prep Discharge Instruction: Use skin prep as directed Topical Gentamicin Discharge Instruction: Apply directly to wound bed. Primary Dressing Hydrofera Blue Classic Foam, 2x2 in Discharge Instruction: Moisten with saline. Apply over the gentamicin ointment. Santyl Ointment Discharge Instruction: ***APPLY SANTYL IN CLINIC ONLY.*** Secondary Dressing Zetuvit Plus Silicone Border Dressing 4x4 (in/in) Discharge Instruction: Apply silicone border over primary dressing as directed. Secured With Compression Wrap Compression Stockings Environmental education officer) Signed: 11/25/2021 10:47:32 AM By: Kalman Shan DO Entered By: Kalman Shan on 11/25/2021 10:42:40 -------------------------------------------------------------------------------- Multi-Disciplinary Care Plan Details Patient Name: Date of Service: Brian Leon, RO NN P. 11/25/2021 10:15 A M Medical Record Number: [id number] Patient Account Number: [id number] Date of Birth/Sex: Treating RN: 07/31/1946 (76 y.o. Brian Leon Primary Care Silviano Neuser: Brian Leon Other Clinician: Referring Caileen Veracruz: Treating Leasa Kincannon/Extender: Brian Leon in Treatment: 2 Active Inactive Pain, Acute or Chronic Nursing Diagnoses: Pain, acute or chronic: actual or potential Potential alteration in comfort, pain Goals: Patient will verbalize adequate pain control and receive pain  control interventions during procedures as needed Date Initiated: 11/11/2021 Target Resolution Date: 12/10/2021 Goal Status: Active Patient/caregiver will verbalize comfort level met Date Initiated: 11/11/2021 Target Resolution Date: 12/09/2021 Goal Status: Active Interventions: Encourage patient to take pain medications as prescribed Provide education on pain management Treatment Activities: Administer pain control measures as ordered : 11/11/2021 Notes: Wound/Skin Impairment Nursing Diagnoses: Knowledge deficit related to ulceration/compromised skin integrity Goals: Patient/caregiver will verbalize understanding of skin care regimen Date Initiated: 11/11/2021 Target Resolution Date: 12/10/2021 Goal Status: Active Interventions: Assess patient/caregiver ability to obtain necessary supplies Assess patient/caregiver ability to perform ulcer/skin care regimen upon admission and as needed Provide education on ulcer and skin care Treatment Activities: Skin care regimen initiated : 11/11/2021 Topical wound management initiated : 11/11/2021 Notes: Electronic Signature(s) Signed: 11/25/2021 5:46:52 PM By: Brian Pilling RN, BSN Entered By: Brian Leon on 11/25/2021 10:33:36 -------------------------------------------------------------------------------- Pain Assessment Details Patient Name: Date of Service: Brian Leon, RO NN P. 11/25/2021 10:15 A M Medical Record Number: [id number] Patient Account Number: [id number] Date of Birth/Sex: Treating RN: 1946-09-18 (76 y.o. Brian Leon Primary Care Estera Ozier: Brian Leon Other Clinician: Referring Jameca Chumley: Treating Luvenia Cranford/Extender: Brian Leon in Treatment: 2 Active Problems Location of Pain Severity and Description of  Pain Patient Has Paino No Site Locations Rate the pain. Current Pain Level: 0 Pain Management and Medication Current Pain Management: Medication: No Cold Application: No Rest:  No Massage: No Activity: No T.E.N.S.: No Heat Application: No Leg drop or elevation: No Is the Current Pain Management Adequate: Adequate How does your wound impact your activities of daily livingo Sleep: No Bathing: No Appetite: No Relationship With Others: No Bladder Continence: No Emotions: No Bowel Continence: No Work: No Toileting: No Drive: No Dressing: No Hobbies: No Engineer, maintenance) Signed: 11/25/2021 5:46:52 PM By: Brian Pilling RN, BSN Entered By: Brian Leon on 11/25/2021 10:25:28 -------------------------------------------------------------------------------- Patient/Caregiver Education Details Patient Name: Date of Service: Brian Leon, RO NN P. 2/23/2023andnbsp10:15 A M Medical Record Number: [id number] Patient Account Number: [id number] Date of Birth/Gender: Treating RN: 01-21-1946 (76 y.o. Brian Leon Primary Care Physician: Brian Leon Other Clinician: Referring Physician: Treating Physician/Extender: Brian Leon in Treatment: 2 Education Assessment Education Provided To: Patient Education Topics Provided Wound/Skin Impairment: Handouts: Skin Care Do's and Dont's Methods: Explain/Verbal Responses: Reinforcements needed Electronic Signature(s) Signed: 11/25/2021 5:46:52 PM By: Brian Pilling RN, BSN Entered By: Brian Leon on 11/25/2021 10:33:55 -------------------------------------------------------------------------------- Wound Assessment Details Patient Name: Date of Service: Brian Leon, RO NN P. 11/25/2021 10:15 A M Medical Record Number: [id number] Patient Account Number: [id number] Date of Birth/Sex: Treating RN: 07-Apr-1946 (76 y.o. Brian Leon Primary Care Lesia Monica: Brian Leon Other Clinician: Referring Jamez Ambrocio: Treating Annisa Mazzarella/Extender: Brian Leon in Treatment: 2 Wound Status Wound Number: 1 Primary Etiology: Abrasion Wound Location: Left,  Medial Lower Leg Secondary Etiology: Venous Leg Ulcer Wounding Event: Trauma Wound Status: Open Date Acquired: 08/02/2021 Comorbid History: Asthma, Hypertension, Hepatitis A, Rheumatoid Arthritis Weeks Of Treatment: 2 Clustered Wound: No Photos Wound Measurements Length: (cm) 2.6 Width: (cm) 0.8 Depth: (cm) 0.1 Area: (cm) 1.634 Volume: (cm) 0.163 % Reduction in Area: 44.8% % Reduction in Volume: 44.9% Epithelialization: Medium (34-66%) Tunneling: No Undermining: No Wound Description Classification: Full Thickness Without Exposed Support Structures Wound Margin: Distinct, outline attached Exudate Amount: Medium Exudate Type: Serosanguineous Exudate Color: red, brown Foul Odor After Cleansing: No Slough/Fibrino Yes Wound Bed Granulation Amount: Medium (34-66%) Exposed Structure Granulation Quality: Red, Pink Fascia Exposed: No Necrotic Amount: Medium (34-66%) Fat Layer (Subcutaneous Tissue) Exposed: Yes Necrotic Quality: Adherent Slough Tendon Exposed: No Muscle Exposed: No Joint Exposed: No Bone Exposed: No Treatment Notes Wound #1 (Lower Leg) Wound Laterality: Left, Medial Cleanser Soap and Water Discharge Instruction: May shower and wash wound with dial antibacterial soap and water prior to dressing change. Byram Ancillary Kit - 15 Day Supply Discharge Instruction: Use supplies as instructed; Kit contains: (15) Saline Bullets; (15) 3x3 Gauze; 15 pr Gloves Peri-Wound Care Skin Prep Discharge Instruction: Use skin prep as directed Topical Gentamicin Discharge Instruction: Apply directly to wound bed. Primary Dressing Hydrofera Blue Classic Foam, 2x2 in Discharge Instruction: Moisten with saline. Apply over the gentamicin ointment. Santyl Ointment Discharge Instruction: ***APPLY SANTYL IN CLINIC ONLY.*** Secondary Dressing Zetuvit Plus Silicone Border Dressing 4x4 (in/in) Discharge Instruction: Apply silicone border over primary dressing as  directed. Secured With Compression Wrap Compression Stockings Environmental education officer) Signed: 11/25/2021 5:46:52 PM By: Brian Pilling RN, BSN Signed: 12/01/2021 3:37:08 PM By: Rhae Hammock RN Entered By: Rhae Hammock on 11/25/2021 10:30:57 -------------------------------------------------------------------------------- Vitals Details Patient Name: Date of Service: Brian Leon, RO NN P. 11/25/2021 10:15 A M Medical Record Number: [id number] Patient Account Number: [id number] Date of Birth/Sex: Treating RN: 1946/09/25 (76 y.o. M)  Brian Leon Primary Care Donnie Gedeon: Brian Leon Other Clinician: Referring Saree Krogh: Treating Ionia Schey/Extender: Brian Leon in Treatment: 2 Vital Signs Time Taken: 10:23 Temperature (F): 97.9 Height (in): 68 Pulse (bpm): 64 Weight (lbs): 140 Respiratory Rate (breaths/min): 20 Body Mass Index (BMI): 21.3 Blood Pressure (mmHg): 156/84 Reference Range: 80 - 120 mg / dl Electronic Signature(s) Signed: 11/25/2021 5:46:52 PM By: Brian Pilling RN, BSN Entered By: Brian Leon on 11/25/2021 10:25:16

## 2021-12-02 ENCOUNTER — Other Ambulatory Visit: Payer: Self-pay

## 2021-12-02 ENCOUNTER — Encounter (HOSPITAL_BASED_OUTPATIENT_CLINIC_OR_DEPARTMENT_OTHER): Payer: Medicare Other | Attending: Internal Medicine | Admitting: Internal Medicine

## 2021-12-02 DIAGNOSIS — G8929 Other chronic pain: Secondary | ICD-10-CM | POA: Diagnosis not present

## 2021-12-02 DIAGNOSIS — X58XXXA Exposure to other specified factors, initial encounter: Secondary | ICD-10-CM | POA: Diagnosis not present

## 2021-12-02 DIAGNOSIS — T1490XA Injury, unspecified, initial encounter: Secondary | ICD-10-CM | POA: Insufficient documentation

## 2021-12-02 DIAGNOSIS — Z87891 Personal history of nicotine dependence: Secondary | ICD-10-CM | POA: Diagnosis not present

## 2021-12-02 DIAGNOSIS — L97822 Non-pressure chronic ulcer of other part of left lower leg with fat layer exposed: Secondary | ICD-10-CM | POA: Diagnosis not present

## 2021-12-02 DIAGNOSIS — M353 Polymyalgia rheumatica: Secondary | ICD-10-CM | POA: Diagnosis not present

## 2021-12-02 NOTE — Progress Notes (Signed)
Brian Leon ([id number]) Visit Report for 12/02/2021 Chief Complaint Document Details Patient Name: Date of Service: Brian Leon, [name] 12/02/2021 10:15 A M Medical Record Number: [id number] Patient Account Number: [id number] Date of Birth/Sex: Treating RN: 1946-08-13 (76 y.o. M) Primary Care Provider: Gerlene Leon Other Clinician: Referring Provider: Treating Provider/Extender: Brian Leon in Treatment: 3 Information Obtained from: Patient Chief Complaint Left lower extremity wound due to trauma Electronic Signature(s) Signed: 12/02/2021 12:38:40 PM By: Brian Shan DO Entered By: Brian Leon on 12/02/2021 12:28:24 -------------------------------------------------------------------------------- Debridement Details Patient Name: Date of Service: Brian Leon, RO NN P. 12/02/2021 10:15 A M Medical Record Number: [id number] Patient Account Number: [id number] Date of Birth/Sex: Treating RN: 1946/08/29 (76 y.o. [name] Primary Care Provider: Gerlene Leon Other Clinician: Referring Provider: Treating Provider/Extender: Brian Leon in Treatment: 3 Debridement Performed for Assessment: Wound #1 Left,Medial Lower Leg Performed By: Physician Brian Shan, DO Debridement Type: Debridement Severity of Tissue Pre Debridement: Fat layer exposed Level of Consciousness (Pre-procedure): Awake and Alert Pre-procedure Verification/Time Out Yes - 10:29 Taken: Start Time: 10:30 Pain Control: Other : benzocaine 20% T Area Debrided (L x W): otal 1.9 (cm) x 0.7 (cm) = 1.33 (cm) Tissue and other material debrided: Non-Viable, Slough, Subcutaneous, Slough Level: Skin/Subcutaneous Tissue Debridement Description: Excisional Instrument: Curette Bleeding: Minimum Hemostasis Achieved: Pressure End Time: 10:36 Response to Treatment: Procedure was tolerated well Level of Consciousness (Post- Awake and  Alert procedure): Post Debridement Measurements of Total Wound Length: (cm) 1.9 Width: (cm) 0.7 Depth: (cm) 0.2 Volume: (cm) 0.209 Character of Wound/Ulcer Post Debridement: Stable Severity of Tissue Post Debridement: Fat layer exposed Post Procedure Diagnosis Same as Pre-procedure Electronic Signature(s) Signed: 12/02/2021 12:38:40 PM By: Brian Shan DO Signed: 12/02/2021 5:00:00 PM By: Brian Leon Entered By: Brian Leon on 12/02/2021 10:45:49 -------------------------------------------------------------------------------- HPI Details Patient Name: Date of Service: Brian Leon, RO NN P. 12/02/2021 10:15 A M Medical Record Number: [id number] Patient Account Number: [id number] Date of Birth/Sex: Treating RN: 06-Jun-1946 (76 y.o. M) Primary Care Provider: Gerlene Leon Other Clinician: Referring Provider: Treating Provider/Extender: Brian Leon in Treatment: 3 History of Present Illness HPI Description: Admission 11/11/2021 Brian Leon is a 76 year old male with a past medical history of polymyalgia rheumatica that presents to the clinic for a 14-monthhistory of nonhealing wound to the left lower extremity. He states that on Halloween he hit an object and created a wound that has not healed. He states he has seen dermatology and his primary care physician for this issue. On one occasion he had an UHaematologist He has also used Xeroform. It does not sound like he uses any kind of wound dressing consistently. He currently reports chronic pain to the wound site. He denies systemic signs of infection. He denies purulent drainage. 2/16; patient presents for follow-up. He has been using Hydrofera Blue and gentamicin ointment to the wound bed without issues. He reports improvement in wound healing. He currently denies signs of infection. 2/23; patient presents for follow-up. He continues to use Hydrofera Blue and gentamicin ointment to the wound bed  without issues. He denies signs of infection. He reports improvement in his chronic pain to the wound bed. 3/2; patient presents for follow-up. He has been using Hydrofera Blue and gentamicin to the wound bed without issues. [place] He currently denies signs of infection. Electronic Signature(s) Signed: 12/02/2021 12:54:28 PM By: Brian [place]  Brian Rogerson DO Previous Signature: 12/02/2021 12:38:40 PM Version By: Brian Shan DO Entered By: Brian Leon on 12/02/2021 12:48:40 -------------------------------------------------------------------------------- Physical Exam Details Patient Name: Date of Service: Brian Leon, RO NN P. 12/02/2021 10:15 A M Medical Record Number: [id number] Patient Account Number: [id number] Date of Birth/Sex: Treating RN: 05-Feb-1946 (76 y.o. M) Primary Care Provider: Gerlene Leon Other Clinician: Referring Provider: Treating Provider/Extender: Brian Leon in Treatment: 3 Constitutional respirations regular, non-labored and within target range for patient.. Cardiovascular 2+ dorsalis pedis/posterior tibialis pulses. Psychiatric pleasant and cooperative. Notes Left lower extremity: Open wound to the anterior aspect with granulation tissue and nonviable tissue present. No surrounding signs of infection. Appears well- healing. Electronic Signature(s) Signed: 12/02/2021 12:54:28 PM By: Brian Shan DO Previous Signature: 12/02/2021 12:38:40 PM Version By: Brian Shan DO Entered By: Brian Leon on 12/02/2021 12:49:01 -------------------------------------------------------------------------------- Physician Orders Details Patient Name: Date of Service: Brian Leon, RO NN P. 12/02/2021 10:15 A M Medical Record Number: [id number] Patient Account Number: [id number] Date of Birth/Sex: Treating RN: 1946/01/07 (76 y.o. [name] Primary Care Provider: Gerlene Leon Other  Clinician: Referring Provider: Treating Provider/Extender: Brian Leon in Treatment: 3 Verbal / Phone Orders: No Diagnosis Coding ICD-10 Coding Code Description [phone] Non-pressure chronic ulcer of other part of left lower leg with fat layer exposed T14.90XA Injury, unspecified, initial encounter M35.3 Polymyalgia rheumatica Follow-up Appointments Return appointment in 3 weeks. - with Dr. Heber Leon Lev[facility] Anna, Room 7) Other: [name] DME-wound care products. Bathing/ Shower/ Hygiene May shower with protection but do not get wound dressing(s) wet. - when not changing the dressing. May shower and wash wound with soap and water. - with dressing changes. Edema Control - Lymphedema / SCD / Other Elevate legs to the level of the heart or above for 30 minutes daily and/or when sitting, a frequency of: - 3-4 times a day throughout the day. Avoid standing for long periods of time. Exercise regularly Moisturize legs daily. - both legs every night before bed. Wound Treatment Wound #1 - Lower Leg Wound Laterality: Left, Medial Cleanser: Soap and Water Every Other Day/30 Days Discharge Instructions: May shower and wash wound with dial antibacterial soap and water prior to dressing change. Cleanser: [employer] Ancillary Kit - 15 Day Supply (DME) (Generic) Every Other Day/30 Days Discharge Instructions: Use supplies as instructed; Kit contains: (15) Saline Bullets; (15) 3x3 Gauze; 15 pr Gloves Peri-Wound Care: Skin Prep (DME) (Generic) Every Other Day/30 Days Discharge Instructions: Use skin prep as directed Topical: Gentamicin Every Other Day/30 Days Discharge Instructions: Apply directly to wound bed. Prim Dressing: Hydrofera Blue Classic Foam, 2x2 in (DME) (Generic) Every Other Day/30 Days ary Discharge Instructions: Moisten with saline. Apply over the gentamicin ointment. Prim Dressing: Santyl Ointment Every Other Day/30 Days ary Discharge Instructions: ***APPLY SANTYL IN  CLINIC ONLY.*** Secondary Dressing: Zetuvit Plus Silicone Border Dressing 4x4 (in/in) (DME) (Generic) Every Other Day/30 Days Discharge Instructions: Apply silicone border over primary dressing as directed. Electronic Signature(s) Signed: 12/02/2021 4:16:47 PM By: Brian Shan DO Signed: 12/02/2021 5:00:00 PM By: Brian Leon Previous Signature: 12/02/2021 12:38:40 PM Version By: Brian Shan DO Entered By: Brian Leon on 12/02/2021 16:11:06 -------------------------------------------------------------------------------- Problem List Details Patient Name: Date of Service: Brian Leon, [place] NN P. 12/02/2021 10:15 A M Medical Record Number: [id number] Patient Account Number: [id number] Date of Birth/Sex: Treating RN: Oct 13, 1945 (76 y.o. [name] Primary Care Provider: Gerlene Leon Other Clinician: Referring Provider: Treating Provider/Extender: Brian Leon in Treatment:  3 Active Problems ICD-10 Encounter Code Description Active Date MDM Diagnosis L97.822 Non-pressure chronic ulcer of other part of left lower leg with fat layer exposed2/06/2022 No Yes T14.90XA Injury, unspecified, initial encounter 11/11/2021 No Yes M35.3 Polymyalgia rheumatica 11/11/2021 No Yes Inactive Problems Resolved Problems Electronic Signature(s) Signed: 12/02/2021 12:54:28 PM By: Brian Shan DO Previous Signature: 12/02/2021 12:38:40 PM Version By: Brian Shan DO Entered By: Brian Leon on 12/02/2021 12:47:47 -------------------------------------------------------------------------------- Progress Note Details Patient Name: Date of Service: Brian Leon, RO NN P. 12/02/2021 10:15 A M Medical Record Number: [id number] Patient Account Number: [id number] Date of Birth/Sex: Treating RN: [birth year] (76 y.o. M) Primary Care Provider: Gerlene Leon Other Clinician: Referring Provider: Treating Provider/Extender: Brian Leon  in Treatment: 3 Subjective Chief Complaint Information obtained from Patient Left lower extremity wound due to trauma History of Present Illness (HPI) Admission 11/11/2021 Mr. Ennis Faith is a 76 year old male with a past medical history of polymyalgia rheumatica that presents to the clinic for a 39-monthhistory of nonhealing wound to the left lower extremity. He states that on Halloween he hit an object and created a wound that has not healed. He states he has seen dermatology and his primary care physician for this issue. On one occasion he had an UHaematologist He has also used Xeroform. It does not sound like he uses any kind of wound dressing consistently. He currently reports chronic pain to the wound site. He denies systemic signs of infection. He denies purulent drainage. 2/16; patient presents for follow-up. He has been using Hydrofera Blue and gentamicin ointment to the wound bed without issues. He reports improvement in wound healing. He currently denies signs of infection. 2/23; patient presents for follow-up. He continues to use Hydrofera Blue and gentamicin ointment to the wound bed without issues. He denies signs of infection. He reports improvement in his chronic pain to the wound bed. 3/2; patient presents for follow-up. He has been using Hydrofera Blue and gentamicin to the wound bed without issues. [place] He currently denies signs of infection. Patient History Information obtained from Chart. Family History Cancer - Mother, No family history of Diabetes, Heart Disease, Hypertension, Kidney Disease, Lung Disease, Seizures, Stroke, Thyroid Problems, Tuberculosis. Social History Former smoker - quit 40 years ago, Marital Status - Married, Alcohol Use - Never, Drug Use - Current History - marijuana, Caffeine Use - Rarely. Medical History Eyes Denies history of Cataracts, Glaucoma, Optic Neuritis Ear/Nose/Mouth/Throat Denies  history of Chronic sinus problems/congestion, Middle ear problems Hematologic/Lymphatic Denies history of Anemia, Hemophilia, Human Immunodeficiency Virus, Lymphedema, Sickle Cell Disease Respiratory Patient has history of Asthma Denies history of Aspiration, Chronic Obstructive Pulmonary Disease (COPD), Pneumothorax, Sleep Apnea, Tuberculosis Cardiovascular Patient has history of Hypertension Denies history of Angina, Arrhythmia, Congestive Heart Failure, Coronary Artery Disease, Deep Vein Thrombosis, Hypotension, Myocardial Infarction, Peripheral Arterial Disease, Peripheral Venous Disease, Phlebitis, Vasculitis Gastrointestinal Patient has history of Hepatitis A Denies history of Cirrhosis , Colitis, Crohnoos, Hepatitis B, Hepatitis C Endocrine Denies history of Type I Diabetes, Type II Diabetes Genitourinary Denies history of End Stage Renal Disease Immunological Denies history of Lupus Erythematosus, Raynaudoos, Scleroderma Integumentary (Skin) Denies history of History of Burn Musculoskeletal Patient has history of Rheumatoid Arthritis Denies history of Gout, Osteoarthritis, Osteomyelitis Psychiatric Denies history of Anorexia/bulimia, Confinement Anxiety Hospitalization/Surgery History - 5 years ago Renal Cell carcinoma. 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 Vitals Time Taken: 10:31 AM, Height: 68 in, Weight: 140 lbs, BMI: 21.3,  Temperature: 98.3 F, Pulse: 63 bpm, Respiratory Rate: 18 breaths/min, Blood Pressure: 160/78 mmHg. Cardiovascular 2+ dorsalis pedis/posterior tibialis pulses. Psychiatric pleasant and cooperative. General Notes: Left lower extremity: Open wound to the anterior aspect with granulation tissue and nonviable tissue present. No surrounding signs of infection. Appears well-healing. Integumentary (Hair, Skin) Wound #1 status is Open. Original cause of wound was Trauma. The date acquired was: 08/02/2021. The wound has been in treatment 3 weeks. The wound is located on the Left,Medial Lower Leg. The wound measures 1.9cm length x 0.7cm width x 0.2cm depth; 1.045cm^2 area and 0.209cm^3 volume. There is Fat Layer (Subcutaneous Tissue) exposed. There is no tunneling or undermining noted. There is a medium amount of serosanguineous drainage noted. The wound margin is distinct with the outline attached to the wound base. There is large (67-100%) red, pink granulation within the wound bed. There is a small (1-33%) amount of necrotic tissue within the wound bed. Assessment Active Problems ICD-10 Non-pressure chronic ulcer of other part of left lower leg with fat layer exposed Injury, unspecified, initial encounter Polymyalgia rheumatica Patient's wound has shown improvement in size and appearance since last clinic visit. I debrided nonviable tissue. I recommended continuing gentamicin ointment with Hydrofera Blue. We will try and get him supplies prior to his departure. We will see him when he gets back. Procedures Wound #1 Pre-procedure diagnosis of Wound #1 is an Abrasion located on the Left,Medial Lower Leg .Severity of Tissue Pre Debridement is: Fat layer exposed. There was a Excisional Skin/Subcutaneous Tissue Debridement with a total area of 1.33 sq cm performed by Brian Shan, DO. With the following instrument(s): Curette to remove Non-Viable tissue/material. Material removed includes  Subcutaneous Tissue and Slough and after achieving pain control using Other (benzocaine 20%). No specimens were taken. A time out was conducted at 10:29, prior to the start of the procedure. A Minimum amount of bleeding was controlled with Pressure. The procedure was tolerated well. Post Debridement Measurements: 1.9cm length x 0.7cm width x 0.2cm depth; 0.209cm^3 volume. Character of Wound/Ulcer Post Debridement is stable. Severity of Tissue Post Debridement is: Fat layer exposed. Post procedure Diagnosis Wound #1: Same as Pre-Procedure Plan Follow-up Appointments: Return appointment in 3 weeks. - with Dr. Heber [place] Lev[facility] Anna, Room 7) Other: [name] DME-wound care products. Bathing/ Shower/ Hygiene: May shower with protection but do not get wound dressing(s) wet. - when not changing the dressing. May shower and wash wound with soap and water. - with dressing changes. Edema Control - Lymphedema / SCD / Other: Elevate legs to the level of the heart or above for 30 minutes daily and/or when sitting, a frequency of: - 3-4 times a day throughout the day. Avoid standing for long periods of time. Exercise regularly Moisturize legs daily. - both legs every night before bed. WOUND #1: - Lower Leg Wound Laterality: Left, Medial Cleanser: Soap and Water Every Other Day/30 Days Discharge Instructions: May shower and wash wound with dial antibacterial soap and water prior to dressing change. Cleanser: [employer] Ancillary Kit - 15 Day Supply (Generic) Every Other Day/30 Days Discharge Instructions: Use supplies as instructed; Kit contains: (15)  Saline Bullets; (15) 3x3 Gauze; 15 pr Gloves Peri-Wound Care: Skin Prep (Generic) Every Other Day/30 Days Discharge Instructions: Use skin prep as directed Topical: Gentamicin Every Other Day/30 Days Discharge Instructions: Apply directly to wound bed. Prim Dressing: Hydrofera Blue Classic Foam, 2x2 in Every Other Day/30 Days ary Discharge Instructions: Moisten with  saline. Apply over the gentamicin ointment. Prim Dressing: Santyl Ointment Every Other Day/30 Days ary Discharge Instructions: ***APPLY SANTYL IN CLINIC ONLY.*** Secondary Dressing: Zetuvit Plus Silicone Border Dressing 4x4 (in/in) (Generic) Every Other Day/30 Days Discharge Instructions: Apply silicone border over primary dressing as directed. 1. Gentamicin ointment with Hydrofera Blue 2. Follow-up in 3 weeks Electronic Signature(s) Signed: 12/02/2021 12:54:28 PM By: Brian Shan DO Previous Signature: 12/02/2021 12:38:40 PM Version By: Brian Shan DO Entered By: Brian Leon on 12/02/2021 12:50:34 -------------------------------------------------------------------------------- HxROS Details Patient Name: Date of Service: Brian Leon, RO NN P. 12/02/2021 10:15 A M Medical Record Number: [id number] Patient Account Number: [id number] Date of Birth/Sex: Treating RN: 07-15-1946 (76 y.o. M) Primary Care Provider: Gerlene Leon Other Clinician: Referring Provider: Treating Provider/Extender: Brian Leon in Treatment: 3 Information Obtained From Chart Eyes Medical History: Negative for: Cataracts; Glaucoma; Optic Neuritis Past Medical History Notes: Blind right eye WEARS PROSTHESIS Ear/Nose/Mouth/Throat Medical History: Negative for: Chronic sinus problems/congestion; Middle ear problems Past Medical History Notes: Allergic rhinitis Hematologic/Lymphatic Medical History: Negative for: Anemia; Hemophilia; Human Immunodeficiency Virus; Lymphedema; Sickle Cell Disease Respiratory Medical History: Positive for: Asthma Negative for: Aspiration; Chronic Obstructive Pulmonary Disease (COPD); Pneumothorax; Sleep Apnea; Tuberculosis Past Medical History Notes: Pneumonia Cardiovascular Medical History: Positive for: Hypertension Negative for: Angina; Arrhythmia; Congestive Heart Failure; Coronary Artery Disease; Deep Vein Thrombosis;  Hypotension; Myocardial Infarction; Peripheral Arterial Disease; Peripheral Venous Disease; Phlebitis; Vasculitis Past Medical History Notes: Thoracic aortic aneurysm Thrombocytosis Gastrointestinal Medical History: Positive for: Hepatitis A Negative for: Cirrhosis ; Colitis; Crohns; Hepatitis B; Hepatitis C Endocrine Medical History: Negative for: Type I Diabetes; Type II Diabetes Genitourinary Medical History: Negative for: End Stage Renal Disease Past Medical History Notes: 1/3kidney missing per patient-Renal Cell carcinoma Renal Cyst 5 years ago Immunological Medical History: Negative for: Lupus Erythematosus; Raynauds; Scleroderma Integumentary (Skin) Medical History: Negative for: History of Burn Musculoskeletal Medical History: Positive for: Rheumatoid Arthritis Negative for: Gout; Osteoarthritis; Osteomyelitis Past Medical History Notes: 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 Eczema Neurologic Medical History: Past Medical History Notes: Paraureteric diverticulum Oncologic Medical History: Past Medical History Notes: Renal Cell carcinoma- 5 years ago Psychiatric Medical History: Negative for: Anorexia/bulimia; Confinement Anxiety Immunizations Pneumococcal Vaccine: Received Pneumococcal Vaccination: Yes Received Pneumococcal Vaccination On or After 60th Birthday: Yes Implantable Devices No devices added Hospitalization / Surgery History Type of Hospitalization/Surgery 5 years ago Renal Cell carcinoma Family and Social History Cancer: Yes - Mother; Diabetes: No; Heart Disease: No; Hypertension: No; Kidney Disease: No; Lung Disease: No; Seizures: No; Stroke: No; Thyroid Problems: No; Tuberculosis: No; Former smoker - quit 40 years ago; Marital Status - Married; Alcohol Use: Never; Drug Use: Current History - marijuana; Caffeine Use: Rarely; Financial Concerns: No; Food, Clothing or Shelter Needs: No;  Support System Lacking: No; Transportation Concerns: No Electronic Signature(s) Signed: 12/02/2021 12:54:28 PM By: Brian Shan DO Previous Signature: 12/02/2021 12:38:40 PM Version By: Brian Shan DO Entered By: Brian Leon on 12/02/2021 12:48:49 -------------------------------------------------------------------------------- SuperBill Details Patient Name: Date of Service: Brian Leon, RO NN P. 12/02/2021 Medical Record Number: [id number] Patient Account Number: [id number] Date of Birth/Sex: Treating RN: 1946-04-14 (76 y.o. [name]  Primary Care Provider: Gerlene Leon Other Clinician: Referring Provider: Treating Provider/Extender: Brian Leon in Treatment: 3 Diagnosis Coding ICD-10 Codes Code Description [phone] Non-pressure chronic ulcer of other part of left lower leg with fat layer exposed T14.90XA Injury, unspecified, initial encounter M35.3 Polymyalgia rheumatica Facility Procedures CPT4 Code: [id number] Description: 32440 - DEB SUBQ TISSUE 20 SQ CM/< ICD-10 Diagnosis Description L97.822 Non-pressure chronic ulcer of other part of left lower leg with fat layer expo Modifier: sed Quantity: 1 Physician Procedures : CPT4 Code Description Modifier [id number] - WC PHYS SUBQ TISS 20 SQ CM ICD-10 Diagnosis Description L97.822 Non-pressure chronic ulcer of other part of left lower leg with fat layer exposed Quantity: 1 Electronic Signature(s) Signed: 12/02/2021 12:54:28 PM By: Brian Shan DO Previous Signature: 12/02/2021 12:38:40 PM Version By: Brian Shan DO Entered By: Brian Leon on 12/02/2021 12:50:45

## 2021-12-02 NOTE — Progress Notes (Signed)
TRAVONTE, BYARD ([id number]) Visit Report for 12/02/2021 Arrival Information Details Patient Name: Date of Service: Brian Leon, [name] 12/02/2021 10:15 A M Medical Record Number: [id number] Patient Account Number: [id number] Date of Birth/Sex: Treating RN: 13-Aug-1946 (75 y.o. [name] Primary Care Margueritte Guthridge: Gerlene Fee Other Clinician: Referring Luqman Perrelli: Treating Syriana Croslin/Extender: Hubert Azure in Treatment: 3 Visit Information History Since Last Visit Added or deleted any medications: No Patient Arrived: Ambulatory Any new allergies or adverse reactions: No Arrival Time: 10:29 Had a fall or experienced change in No Transfer Assistance: None activities of daily living that may affect Patient Identification Verified: Yes risk of falls: Secondary Verification Process Completed: Yes Signs or symptoms of abuse/neglect since last visito No Patient Requires Transmission-Based Precautions: No Hospitalized since last visit: No Patient Has Alerts: Yes Implantable device outside of the clinic excluding No Patient Alerts: 11/02/21 L ABI: 1.37 cellular tissue based products placed in the center 11/02/2021 L TBI 0.77 since last visit: Has Dressing in Place as Prescribed: Yes Pain Present Now: No Electronic Signature(s) Signed: 12/02/2021 5:00:00 PM By: Lorrin Jackson Entered By: Lorrin Jackson on 12/02/2021 10:31:49 -------------------------------------------------------------------------------- Encounter Discharge Information Details Patient Name: Date of Service: Brian Leon, [place] NN P. 12/02/2021 10:15 A M Medical Record Number: [id number] Patient Account Number: [id number] Date of Birth/Sex: Treating RN: 09/02/1946 (76 y.o. [name] Primary Care Maia Handa: Gerlene Fee Other Clinician: Referring Idella Lamontagne: Treating Stephannie Broner/Extender: Hubert Azure in Treatment: 3 Encounter Discharge Information Items Post  Procedure Vitals Discharge Condition: Stable Temperature (F): 98.3 Ambulatory Status: Ambulatory Pulse (bpm): 63 Discharge Destination: Home Respiratory Rate (breaths/min): 18 Transportation: Private Auto Blood Pressure (mmHg): 160/78 Schedule Follow-up Appointment: Yes Clinical Summary of Care: Provided on 12/02/2021 Form Type Recipient Paper Patient Patient Electronic Signature(s) Signed: 12/02/2021 5:00:00 PM By: Lorrin Jackson Entered By: Lorrin Jackson on 12/02/2021 10:55:51 -------------------------------------------------------------------------------- Lower Extremity Assessment Details Patient Name: Date of Service: Brian Leon, [place] NN P. 12/02/2021 10:15 A M Medical Record Number: [id number] Patient Account Number: [id number] Date of Birth/Sex: Treating RN: 05/28/1946 (76 y.o. [name] Primary Care Akyia Borelli: Gerlene Fee Other Clinician: Referring Fatema Rabe: Treating Kyira Volkert/Extender: Hubert Azure in Treatment: 3 Edema Assessment Assessed: [name]: Yes] [name]: No] Edema: [Left: N] [Right: o] Calf Left: Right: Point of Measurement: 37 cm From Medial Instep 30 cm Ankle Left: Right: Point of Measurement: 12 cm From Medial Instep 20 cm Vascular Assessment Pulses: Dorsalis Pedis Palpable: [Left:Yes] Electronic Signature(s) Signed: 12/02/2021 5:00:00 PM By: Lorrin Jackson Entered By: Lorrin Jackson on 12/02/2021 10:35:36 -------------------------------------------------------------------------------- Multi Wound Chart Details Patient Name: Date of Service: Brian Leon, [place] NN P. 12/02/2021 10:15 A M Medical Record Number: [id number] Patient Account Number: [id number] Date of Birth/Sex: Treating RN: 1946-07-04 (76 y.o. M) Primary Care Aubrie Lucien: Gerlene Fee Other Clinician: Referring Maddison Kilner: Treating Tyson Masin/Extender: Hubert Azure in Treatment: 3 Vital Signs Height(in): 68 Pulse(bpm):  63 Weight(lbs): 140 Blood Pressure(mmHg): 160/78 Body Mass Index(BMI): 21.3 Temperature(F): 98.3 Respiratory Rate(breaths/min): 18 Photos: [1:Left, Medial Lower Leg] [N/A:N/A N/A] Wound Location: [1:Trauma] [N/A:N/A] Wounding Event: [1:Abrasion] [N/A:N/A] Primary Etiology: [1:Venous Leg Ulcer] [N/A:N/A] Secondary Etiology: [1:Asthma, Hypertension, Hepatitis A, N/A] Comorbid History: [1:Rheumatoid Arthritis 08/02/2021] [N/A:N/A] Date Acquired: [1:3] [N/A:N/A] Weeks of Treatment: [1:Open] [N/A:N/A] Wound Status: [1:No] [N/A:N/A] Wound Recurrence: [1:1.9x0.7x0.2] [N/A:N/A] Measurements L x W x D (cm) [1:1.045] [N/A:N/A] A (cm) : rea [1:0.209] [N/A:N/A] Volume (cm) : [1:64.70%] [N/A:N/A] % Reduction in A [1:rea: 29.40%] [N/A:N/A] % Reduction in  Volume: [1:Full Thickness Without Exposed] [N/A:N/A] Classification: [1:Support Structures Medium] [N/A:N/A] Exudate A mount: [1:Serosanguineous] [N/A:N/A] Exudate Type: [1:red, brown] [N/A:N/A] Exudate Color: [1:Distinct, outline attached] [N/A:N/A] Wound Margin: [1:Large (67-100%)] [N/A:N/A] Granulation A mount: [1:Red, Pink] [N/A:N/A] Granulation Quality: [1:Small (1-33%)] [N/A:N/A] Necrotic A mount: [1:Fat Layer (Subcutaneous Tissue): Yes N/A] Exposed Structures: [1:Fascia: No Tendon: No Muscle: No Joint: No Bone: No Medium (34-66%)] [N/A:N/A] Epithelialization: [1:Debridement - Excisional] [N/A:N/A] Debridement: Pre-procedure Verification/Time Out 10:29 [N/A:N/A] Taken: [1:Other] [N/A:N/A] Pain Control: [1:Subcutaneous, Slough] [N/A:N/A] Tissue Debrided: [1:Skin/Subcutaneous Tissue] [N/A:N/A] Level: [1:1.33] [N/A:N/A] Debridement A (sq cm): [1:rea Curette] [N/A:N/A] Instrument: [1:Minimum] [N/A:N/A] Bleeding: [1:Pressure] [N/A:N/A] Hemostasis A chieved: [1:Procedure was tolerated well] [N/A:N/A] Debridement Treatment Response: [1:1.9x0.7x0.2] [N/A:N/A] Post Debridement Measurements L x W x D (cm) [1:0.209] [N/A:N/A] Post  Debridement Volume: (cm) [1:Debridement] [N/A:N/A] Treatment Notes Wound #1 (Lower Leg) Wound Laterality: Left, Medial Cleanser Soap and Water Discharge Instruction: May shower and wash wound with dial antibacterial soap and water prior to dressing change. Byram Ancillary Kit - 15 Day Supply Discharge Instruction: Use supplies as instructed; Kit contains: (15) Saline Bullets; (15) 3x3 Gauze; 15 pr Gloves Peri-Wound Care Skin Prep Discharge Instruction: Use skin prep as directed Topical Gentamicin Discharge Instruction: Apply directly to wound bed. Primary Dressing Hydrofera Blue Classic Foam, 2x2 in Discharge Instruction: Moisten with saline. Apply over the gentamicin ointment. Santyl Ointment Discharge Instruction: ***APPLY SANTYL IN CLINIC ONLY.*** Secondary Dressing Zetuvit Plus Silicone Border Dressing 4x4 (in/in) Discharge Instruction: Apply silicone border over primary dressing as directed. Secured With Compression Wrap Compression Stockings Environmental education officer) Signed: 12/02/2021 12:54:28 PM By: Kalman Shan DO Previous Signature: 12/02/2021 12:38:40 PM Version By: Kalman Shan DO Entered By: Kalman Shan on 12/02/2021 12:47:59 -------------------------------------------------------------------------------- Multi-Disciplinary Care Plan Details Patient Name: Date of Service: Brian Leon, [place] NN P. 12/02/2021 10:15 A M Medical Record Number: [id number] Patient Account Number: [id number] Date of Birth/Sex: Treating RN: [birth year] (76 y.o. [name] Primary Care Lyndie Vanderloop: Gerlene Fee Other Clinician: Referring Jersey Ravenscroft: Treating Antwain Caliendo/Extender: Hubert Azure in Treatment: 3 Active Inactive Pain, Acute or Chronic Nursing Diagnoses: Pain, acute or chronic: actual or potential Potential alteration in comfort, pain Goals: Patient will verbalize adequate pain control and receive pain control interventions  during procedures as needed Date Initiated: 11/11/2021 Target Resolution Date: 12/09/2021 Goal Status: Active Patient/caregiver will verbalize comfort level met Date Initiated: 11/11/2021 Target Resolution Date: 12/09/2021 Goal Status: Active Interventions: Encourage patient to take pain medications as prescribed Provide education on pain management Treatment Activities: Administer pain control measures as ordered : 11/11/2021 Notes: Wound/Skin Impairment Nursing Diagnoses: Knowledge deficit related to ulceration/compromised skin integrity Goals: Patient/caregiver will verbalize understanding of skin care regimen Date Initiated: 11/11/2021 Target Resolution Date: 12/09/2021 Goal Status: Active Interventions: Assess patient/caregiver ability to obtain necessary supplies Assess patient/caregiver ability to perform ulcer/skin care regimen upon admission and as needed Provide education on ulcer and skin care Treatment Activities: Skin care regimen initiated : 11/11/2021 Topical wound management initiated : 11/11/2021 Notes: Electronic Signature(s) Signed: 12/02/2021 5:00:00 PM By: Lorrin Jackson Signed: 12/02/2021 5:00:00 PM By: Lorrin Jackson Entered By: Lorrin Jackson on 12/02/2021 10:28:43 -------------------------------------------------------------------------------- Pain Assessment Details Patient Name: Date of Service: Brian Leon, [place] NN P. 12/02/2021 10:15 A M Medical Record Number: [id number] Patient Account Number: [id number] Date of Birth/Sex: Treating RN: [birth year] (76 y.o. [name] Primary Care Isabeau Mccalla: Gerlene Fee Other Clinician: Referring Grazia Taffe: Treating Mattie Novosel/Extender: Hubert Azure in Treatment: 3 Active Problems Location of Pain Severity and Description  of Pain Patient Has Paino No Site Locations Pain Management and Medication Current Pain Management: Electronic Signature(s) Signed: 12/02/2021 5:00:00 PM By: Lorrin Jackson Entered By: Lorrin Jackson on 12/02/2021 10:33:26 -------------------------------------------------------------------------------- Patient/Caregiver Education Details Patient Name: Date of Service: Brian Leon, [place] NN P. 3/2/2023andnbsp10:15 [place] Record Number: [id number] Patient Account Number: [id number] Date of Birth/Gender: Treating RN: 08/30/1946 (76 y.o. [name] Primary Care Physician: Gerlene Fee Other Clinician: Referring Physician: Treating Physician/Extender: Hubert Azure in Treatment: 3 Education Assessment Education Provided To: Patient Education Topics Provided Wound/Skin Impairment: Methods: Explain/Verbal, Printed Responses: State content correctly Electronic Signature(s) Signed: 12/02/2021 5:00:00 PM By: Lorrin Jackson Entered By: Lorrin Jackson on 12/02/2021 10:28:57 -------------------------------------------------------------------------------- Wound Assessment Details Patient Name: Date of Service: Brian Leon, [place] NN P. 12/02/2021 10:15 A M Medical Record Number: [id number] Patient Account Number: [id number] Date of Birth/Sex: Treating RN: May 18, 1946 (76 y.o. [name] Primary Care Shriya Aker: Gerlene Fee Other Clinician: Referring Radley Teston: Treating Coral Soler/Extender: Hubert Azure in Treatment: 3 Wound Status Wound Number: 1 Primary Etiology: Abrasion Wound Location: Left, Medial Lower Leg Secondary Etiology: Venous Leg Ulcer Wounding Event: Trauma Wound Status: Open Date Acquired: 08/02/2021 Comorbid History: Asthma, Hypertension, Hepatitis A, Rheumatoid Arthritis Weeks Of Treatment: 3 Clustered Wound: No Photos Wound Measurements Length: (cm) 1.9 Width: (cm) 0.7 Depth: (cm) 0.2 Area: (cm) 1.045 Volume: (cm) 0.209 % Reduction in Area: 64.7% % Reduction in Volume: 29.4% Epithelialization: Medium (34-66%) Tunneling: No Undermining: No Wound  Description Classification: Full Thickness Without Exposed Support Structures Wound Margin: Distinct, outline attached Exudate Amount: Medium Exudate Type: Serosanguineous Exudate Color: red, brown Foul Odor After Cleansing: No Slough/Fibrino Yes Wound Bed Granulation Amount: Large (67-100%) Exposed Structure Granulation Quality: Red, Pink Fascia Exposed: No Necrotic Amount: Small (1-33%) Fat Layer (Subcutaneous Tissue) Exposed: Yes Tendon Exposed: No Muscle Exposed: No Joint Exposed: No Bone Exposed: No Treatment Notes Wound #1 (Lower Leg) Wound Laterality: Left, Medial Cleanser Soap and Water Discharge Instruction: May shower and wash wound with dial antibacterial soap and water prior to dressing change. Byram Ancillary Kit - 15 Day Supply Discharge Instruction: Use supplies as instructed; Kit contains: (15) Saline Bullets; (15) 3x3 Gauze; 15 pr Gloves Peri-Wound Care Skin Prep Discharge Instruction: Use skin prep as directed Topical Gentamicin Discharge Instruction: Apply directly to wound bed. Primary Dressing Hydrofera Blue Classic Foam, 2x2 in Discharge Instruction: Moisten with saline. Apply over the gentamicin ointment. Santyl Ointment Discharge Instruction: ***APPLY SANTYL IN CLINIC ONLY.*** Secondary Dressing Zetuvit Plus Silicone Border Dressing 4x4 (in/in) Discharge Instruction: Apply silicone border over primary dressing as directed. Secured With Compression Wrap Compression Stockings Environmental education officer) Signed: 12/02/2021 5:00:00 PM By: Lorrin Jackson Entered By: Lorrin Jackson on 12/02/2021 10:44:42 -------------------------------------------------------------------------------- Vitals Details Patient Name: Date of Service: Brian Leon, [place] NN P. 12/02/2021 10:15 A M Medical Record Number: [id number] Patient Account Number: [id number] Date of Birth/Sex: Treating RN: 08-23-1946 (76 y.o. [name] Primary Care Argenis Kumari: Gerlene Fee Other Clinician: Referring Tansy Lorek: Treating Dorea Duff/Extender: Hubert Azure in Treatment: 3 Vital Signs Time Taken: 10:31 Temperature (F): 98.3 Height (in): 68 Pulse (bpm): 63 Weight (lbs): 140 Respiratory Rate (breaths/min): 18 Body Mass Index (BMI): 21.3 Blood Pressure (mmHg): 160/78 Reference Range: 80 - 120 mg / dl Electronic Signature(s) Signed: 12/02/2021 5:00:00 PM By: Lorrin Jackson Entered By: Lorrin Jackson on 12/02/2021 10:33:19

## 2021-12-23 ENCOUNTER — Other Ambulatory Visit: Payer: Self-pay | Admitting: Cardiology

## 2021-12-24 ENCOUNTER — Other Ambulatory Visit: Payer: Self-pay

## 2021-12-24 ENCOUNTER — Encounter (HOSPITAL_BASED_OUTPATIENT_CLINIC_OR_DEPARTMENT_OTHER): Payer: Medicare Other | Admitting: Internal Medicine

## 2021-12-24 ENCOUNTER — Other Ambulatory Visit: Payer: Medicare Other | Admitting: *Deleted

## 2021-12-24 DIAGNOSIS — M353 Polymyalgia rheumatica: Secondary | ICD-10-CM | POA: Diagnosis not present

## 2021-12-24 DIAGNOSIS — E7800 Pure hypercholesterolemia, unspecified: Secondary | ICD-10-CM | POA: Diagnosis not present

## 2021-12-24 DIAGNOSIS — L97822 Non-pressure chronic ulcer of other part of left lower leg with fat layer exposed: Secondary | ICD-10-CM

## 2021-12-24 DIAGNOSIS — G8929 Other chronic pain: Secondary | ICD-10-CM | POA: Diagnosis not present

## 2021-12-24 DIAGNOSIS — Z87891 Personal history of nicotine dependence: Secondary | ICD-10-CM | POA: Diagnosis not present

## 2021-12-24 DIAGNOSIS — U071 COVID-19: Secondary | ICD-10-CM | POA: Diagnosis not present

## 2021-12-24 DIAGNOSIS — T1490XA Injury, unspecified, initial encounter: Secondary | ICD-10-CM | POA: Diagnosis not present

## 2021-12-24 NOTE — Progress Notes (Signed)
TALYN, DESSERT. ([id number]) ?Visit Report for 12/24/2021 ?Arrival Information Details ?Patient Name: Date of Service: ?Brian Leon, [name] 12/24/2021 9:30 A M ?Medical Record Number: [id number] ?Patient Account Number: [id number] ?Date of Birth/Sex: Treating RN: ?12/01/1945 (76 y.o. [name] ?Primary Care Ryonna Cimini: Gerlene Fee Other Clinician: ?Referring Kawena Lyday: ?Treating Dontrae Morini/Extender: Kalman Shan ?Autry-Lott, [name] ?Weeks in Treatment: 6 ?Visit Information History Since Last Visit ?Added or deleted any medications: No ?Patient Arrived: Ambulatory ?Any new allergies or adverse reactions: No ?Arrival Time: 09:35 ?Had a fall or experienced change in No ?Transfer Assistance: None ?activities of daily living that may affect ?Patient Identification Verified: Yes ?risk of falls: ?Secondary Verification Process Completed: Yes ?Signs or symptoms of abuse/neglect since last visito No ?Patient Requires Transmission-Based Precautions: No ?Hospitalized since last visit: No ?Patient Has Alerts: Yes ?Implantable device outside of the clinic excluding No ?Patient Alerts: ?11/02/21 L ABI: 1.37 ?cellular tissue based products placed in the center ?11/02/2021 L TBI 0.77 ?since last visit: ?Has Dressing in Place as Prescribed: Yes ?Pain Present Now: No ?Electronic Signature(s) ?Signed: 12/24/2021 12:10:10 PM By: Lorrin Jackson ?Entered By: Lorrin Jackson on 12/24/2021 09:36:25 ?-------------------------------------------------------------------------------- ?Encounter Discharge Information Details ?Patient Name: Date of Service: ?Brian Leon, [name] 12/24/2021 9:30 A M ?Medical Record Number: [id number] ?Patient Account Number: [id number] ?Date of Birth/Sex: Treating RN: ?September 15, 1946 (76 y.o. [name] ?Primary Care Jalien Weakland: Gerlene Fee Other Clinician: ?Referring Brunetta Newingham: ?Treating Valleri Hendricksen/Extender: Kalman Shan ?Autry-Lott, [name] ?Weeks in Treatment: 6 ?Encounter Discharge Information Items Post  Procedure Vitals ?Discharge Condition: Stable ?Temperature (F): 98.1 ?Ambulatory Status: Ambulatory ?Pulse (bpm): 67 ?Discharge Destination: Home ?Respiratory Rate (breaths/min): 18 ?Transportation: Private Auto ?Blood Pressure (mmHg): 115/72 ?Schedule Follow-up Appointment: Yes ?Clinical Summary of Care: Provided on 12/24/2021 ?Form Type Recipient ?Paper Patient Patient ?Electronic Signature(s) ?Signed: 12/24/2021 12:10:10 PM By: Lorrin Jackson ?Entered By: Lorrin Jackson on 12/24/2021 10:15:55 ?-------------------------------------------------------------------------------- ?Lower Extremity Assessment Details ?Patient Name: ?Date of Service: ?Brian Leon, [name] 12/24/2021 9:30 A M ?Medical Record Number: [id number] ?Patient Account Number: [id number] ?Date of Birth/Sex: ?Treating RN: ?20-Dec-1945 (76 y.o. [name] ?Primary Care Cheyanna Strick: Gerlene Fee ?Other Clinician: ?Referring Hillery Zachman: ?Treating Bronnie Vasseur/Extender: Kalman Shan ?Autry-Lott, [name] ?Weeks in Treatment: 6 ?Edema Assessment ?Assessed: [Left: Yes] [Right: No] ?Edema: [Left: N] [Right: o] ?Calf ?Left: Right: ?Point of Measurement: 37 cm From Medial Instep 30 cm ?Ankle ?Left: Right: ?Point of Measurement: 12 cm From Medial Instep 20 cm ?Vascular Assessment ?Pulses: ?Dorsalis Pedis ?Palpable: [Left:Yes] ?Electronic Signature(s) ?Signed: 12/24/2021 12:10:10 PM By: Lorrin Jackson ?Entered By: Lorrin Jackson on 12/24/2021 09:41:07 ?-------------------------------------------------------------------------------- ?Multi Wound Chart Details ?Patient Name: ?Date of Service: ?Brian Leon, [name] 12/24/2021 9:30 A M ?Medical Record Number: [id number] ?Patient Account Number: [id number] ?Date of Birth/Sex: ?Treating RN: ?[birth year] (76 y.o. M) ?Primary Care Zackarey Holleman: Gerlene Fee ?Other Clinician: ?Referring Calloway Andrus: ?Treating Blue Ruggerio/Extender: Kalman Shan ?Autry-Lott, [name] ?Weeks in Treatment: 6 ?Vital Signs ?Height(in): 68 ?Pulse(bpm):  67 ?Weight(lbs): 140 ?Blood Pressure(mmHg): 115/72 ?Body Mass Index(BMI): 21.3 ?Temperature(??F): 98.1 ?Respiratory Rate(breaths/min): 18 ?Photos: [1:Left, Medial Lower Leg] [N/A:N/A N/A] ?Wound Location: [1:Trauma] [N/A:N/A] ?Wounding Event: [1:Abrasion] [N/A:N/A] ?Primary Etiology: [1:Venous Leg Ulcer] [N/A:N/A] ?Secondary Etiology: [1:Asthma, Hypertension, Hepatitis A, N/A] ?Comorbid History: [1:Rheumatoid Arthritis 08/02/2021] [N/A:N/A] ?Date Acquired: [1:6] [N/A:N/A] ?Weeks of Treatment: [1:Open] [N/A:N/A] ?Wound Status: [1:No] [N/A:N/A] ?Wound Recurrence: [1:0.7x0.5x0.3] [N/A:N/A] ?Measurements L x W x D (cm) [1:0.275] [N/A:N/A] ?A (cm?) : ?rea [1:0.082] [N/A:N/A] ?Volume (cm?) : [1:90.70%] [N/A:N/A] ?% Reduction in A [1:rea: 72.30%] [N/A:N/A] ?% Reduction in  Volume: [1:7] ?Starting Position 1 (o'clock): [1:9] ?Ending Position 1 (o'clock): [1:0.3] ?Maximum Distance 1 (cm): [1:Yes] [N/A:N/A] ?Undermining: [1:Full Thickness Without Exposed] [N/A:N/A] ?Classification: [1:Support Structures Medium] [N/A:N/A] ?Exudate A mount: [1:Serosanguineous] [N/A:N/A] ?Exudate Type: [1:red, brown] [N/A:N/A] ?Exudate Color: [1:Distinct, outline attached] [N/A:N/A] ?Wound Margin: [1:Large (67-100%)] [N/A:N/A] ?Granulation A mount: [1:Red, Pink] [N/A:N/A] ?Granulation Quality: [1:Small (1-33%)] [N/A:N/A] ?Necrotic A mount: ?[1:Fat Layer (Subcutaneous Tissue): Yes N/A] ?Exposed Structures: ?[1:Fascia: No Tendon: No Muscle: No Joint: No Bone: No Medium (34-66%)] [N/A:N/A] ?Epithelialization: [1:Debridement - Excisional] [N/A:N/A] ?Debridement: ?Pre-procedure Verification/Time Out 09:44 [N/A:N/A] ?Taken: [1:Other] [N/A:N/A] ?Pain Control: [1:Subcutaneous, Slough] [N/A:N/A] ?Tissue Debrided: [1:Skin/Subcutaneous Tissue] [N/A:N/A] ?Level: [1:0.35] [N/A:N/A] ?Debridement A (sq cm): [1:rea Curette] [N/A:N/A] ?Instrument: [1:Minimum] [N/A:N/A] ?Bleeding: [1:Pressure] [N/A:N/A] ?Hemostasis A chieved: [1:Procedure was tolerated well]  [N/A:N/A] ?Debridement Treatment Response: [1:0.7x0.5x0.3] [N/A:N/A] ?Post Debridement Measurements L x ?W x D (cm) [1:0.082] [N/A:N/A] ?Post Debridement Volume: (cm?) [1:Debridement] [N/A:N/A] ?Treatment Notes ?Wound #1 (Lower Leg) Wound Laterality: Left, Medial ?Cleanser ?Soap and Water ?Discharge Instruction: May shower and wash wound with dial antibacterial soap and water prior to dressing change. ?Byram Ancillary Kit - 15 Day Supply ?Discharge Instruction: Use supplies as instructed; Kit contains: (15) Saline Bullets; (15) 3x3 Gauze; 15 pr Gloves ?Peri-Wound Care ?Skin Prep ?Discharge Instruction: Use skin prep as directed ?Topical ?Gentamicin ?Discharge Instruction: Apply directly to wound bed. ?Primary Dressing ?Hydrofera Blue Classic Foam, 2x2 in ?Discharge Instruction: Moisten with saline. Apply over the gentamicin ointment. ?Secondary Dressing ?Zetuvit Plus Silicone Border Dressing 4x4 (in/in) ?Discharge Instruction: Apply silicone border over primary dressing as directed. ?Secured With ?Compression Wrap ?Compression Stockings ?Add-Ons ?Electronic Signature(s) ?Signed: 12/24/2021 10:53:34 AM By: Kalman Shan DO ?Entered By: Kalman Shan on 12/24/2021 10:48:35 ?-------------------------------------------------------------------------------- ?Multi-Disciplinary Care Plan Details ?Patient Name: ?Date of Service: ?Brian Leon, [name] 12/24/2021 9:30 A M ?Medical Record Number: [id number] ?Patient Account Number: [id number] ?Date of Birth/Sex: ?Treating RN: ?11/02/1945 (76 y.o. [name] ?Primary Care Bowie Delia: Gerlene Fee ?Other Clinician: ?Referring Lyric Rossano: ?Treating Piper Albro/Extender: Kalman Shan ?Autry-Lott, [name] ?Weeks in Treatment: 6 ?Active Inactive ?Wound/Skin Impairment ?Nursing Diagnoses: ?Knowledge deficit related to ulceration/compromised skin integrity ?Goals: ?Patient/caregiver will verbalize understanding of skin care regimen ?Date Initiated: 11/11/2021 ?Target Resolution  Date: 01/14/2022 ?Goal Status: Active ?Interventions: ?Assess patient/caregiver ability to obtain necessary supplies ?Assess patient/caregiver ability to perform ulcer/skin care regimen upon admission and as

## 2021-12-24 NOTE — Progress Notes (Signed)
TAJAH, Leon. ([id number]) ?Visit Report for 12/24/2021 ?Chief Complaint Document Details ?Patient Name: Date of Service: ?Brian Leon, [name] 12/24/2021 9:30 A M ?Medical Record Number: [id number] ?Patient Account Number: [id number] ?Date of Birth/Sex: Treating RN: ?[birth year] (76 y.o. M) ?Primary Care Provider: Gerlene Leon Other Clinician: ?Referring Provider: ?Treating Provider/Extender: Brian Leon ?Brian Leon, Brian Leon ?Weeks in Treatment: 6 ?Information Obtained from: Patient ?Chief Complaint ?Left lower extremity wound due to trauma ?Electronic Signature(s) ?Signed: 12/24/2021 10:53:34 AM By: Brian Shan DO ?Entered By: Brian Leon on 12/24/2021 10:48:49 ?-------------------------------------------------------------------------------- ?Debridement Details ?Patient Name: Date of Service: ?Brian Leon, [name] 12/24/2021 9:30 A M ?Medical Record Number: [id number] ?Patient Account Number: [id number] ?Date of Birth/Sex: Treating RN: ?01/02/1946 (76 y.o. [name] ?Primary Care Provider: Gerlene Leon Other Clinician: ?Referring Provider: ?Treating Provider/Extender: Brian Leon ?Brian Leon, Brian Leon ?Weeks in Treatment: 6 ?Debridement Performed for Assessment: Wound #1 Left,Medial Lower Leg ?Performed By: Physician Brian Shan, DO ?Debridement Type: Debridement ?Severity of Tissue Pre Debridement: Fat layer exposed ?Level of Consciousness (Pre-procedure): Awake and Alert ?Pre-procedure Verification/Time Out Yes - 09:44 ?Taken: ?Start Time: 09:45 ?Pain Control: ?Other : Benzocaine ?T Area Debrided (L x W): ?otal 0.7 (cm) x 0.5 (cm) = 0.35 (cm?) ?Tissue and other material debrided: Non-Viable, Slough, Subcutaneous, [place] ?Level: Skin/Subcutaneous Tissue ?Debridement Description: Excisional ?Instrument: Curette ?Bleeding: Minimum ?Hemostasis Achieved: Pressure ?End Time: 09:52 ?Response to Treatment: Procedure was tolerated well ?Level of Consciousness (Post- Awake and  Alert ?procedure): ?Post Debridement Measurements of Total Wound ?Length: (cm) 0.7 ?Width: (cm) 0.5 ?Depth: (cm) 0.3 ?Volume: (cm?) 0.082 ?Character of Wound/Ulcer Post Debridement: Stable ?Severity of Tissue Post Debridement: Fat layer exposed ?Post Procedure Diagnosis ?Same as Pre-procedure ?Electronic Signature(s) ?Signed: 12/24/2021 10:53:34 AM By: Brian Shan DO ?Signed: 12/24/2021 12:10:10 PM By: Brian Leon ?Entered By: Brian Leon on 12/24/2021 10:07:00 ?-------------------------------------------------------------------------------- ?HPI Details ?Patient Name: Date of Service: ?Brian Leon, [name] 12/24/2021 9:30 A M ?Medical Record Number: [id number] ?Patient Account Number: [id number] ?Date of Birth/Sex: Treating RN: ?June 03, 1946 (76 y.o. M) ?Primary Care Provider: Gerlene Leon Other Clinician: ?Referring Provider: ?Treating Provider/Extender: Brian Leon ?Brian Leon, Brian Leon ?Weeks in Treatment: 6 ?History of Present Illness ?HPI Description: Admission 11/11/2021 ?[name] is a 76 year old male with a past medical history of polymyalgia rheumatica that presents to the clinic for a 60-monthhistory of nonhealing ?wound to the left lower extremity. He states that on Halloween he hit an object and created a wound that has not healed. He states he has seen dermatology ?and his primary care physician for this issue. On one occasion he had an UHaematologist He has also used Xeroform. It does not sound like he uses any kind of ?wound dressing consistently. He currently reports chronic pain to the wound site. He denies systemic signs of infection. He denies purulent drainage. ?2/16; patient presents for follow-up. He has been using Hydrofera Blue and gentamicin ointment to the wound bed without issues. He reports improvement in ?wound healing. He currently denies signs of infection. ?2/23; patient presents for follow-up. He continues to use Hydrofera Blue and gentamicin ointment to the wound bed  without issues. He denies signs of ?infection. He reports improvement in his chronic pain to the wound bed. ?3/2; patient presents for follow-up. He has been using Hydrofera Blue and gentamicin to the wound bed without issues. [place] He currently denies signs of infection. ?3/24; patient presents for follow-up. He has been in  [place] for the past 3 weeks for vacation. He enjoyed his trip. He reports no issues today. He has been ?using Hydrofera Blue and gentamicin ointment to the wound beds without issues. ?Electronic Signature(s) ?Signed: 12/24/2021 10:53:34 AM By: Brian Shan DO ?Entered By: Brian Leon on 12/24/2021 10:49:17 ?-------------------------------------------------------------------------------- ?Physical Exam Details ?Patient Name: Date of Service: ?Brian Leon, [name] 12/24/2021 9:30 A M ?Medical Record Number: [id number] ?Patient Account Number: [id number] ?Date of Birth/Sex: Treating RN: ?1946-01-05 (76 y.o. M) ?Primary Care Provider: Gerlene Leon Other Clinician: ?Referring Provider: ?Treating Provider/Extender: Brian Leon ?Brian Leon, Brian Leon ?Weeks in Treatment: 6 ?Constitutional ?respirations regular, non-labored and within target range for patient.Marland Kitchen ?Cardiovascular ?2+ dorsalis pedis/posterior tibialis pulses. ?Psychiatric ?pleasant and cooperative. ?Notes ?Left lower extremity: Open wound to the anterior aspect with granulation tissue and scant nonviable tissue. No surrounding signs of infection. ?Electronic Signature(s) ?Signed: 12/24/2021 10:53:34 AM By: Brian Shan DO ?Entered By: Brian Leon on 12/24/2021 10:50:16 ?-------------------------------------------------------------------------------- ?Physician Orders Details ?Patient Name: ?Date of Service: ?Brian Leon, [name] 12/24/2021 9:30 A M ?Medical Record Number: [id number] ?Patient Account Number: [id number] ?Date of Birth/Sex: ?Treating RN: ?06-02-1946 (76 y.o. [name] ?Primary Care Provider: Gerlene Leon ?Other Clinician: ?Referring Provider: ?Treating Provider/Extender: Brian Leon ?Brian Leon, Brian Leon ?Weeks in Treatment: 6 ?Verbal / Phone Orders: No ?Diagnosis Coding ?ICD-10 Coding ?Code Description ?W96.759 Non-pressure chronic ulcer of other part of left lower leg with fat layer exposed ?T14.90XA Injury, unspecified, initial encounter ?M35.3 Polymyalgia rheumatica ?Follow-up Appointments ?ppointment in 2 weeks. - with Dr. Heber [place] Leveda Anna, Room 7) ?Return A ?Other: [name] DME-wound care products. ?Licensed conveyancer ?May shower with protection but do not get wound dressing(s) wet. - when not changing the dressing. ?May shower and wash wound with soap and water. - with dressing changes. ?Edema Control - Lymphedema / SCD / Other ?Elevate legs to the level of the heart or above for 30 minutes daily and/or when sitting, a frequency of: - 3-4 times a day throughout the day. ?Avoid standing for long periods of time. ?Exercise regularly ?Moisturize legs daily. - both legs every night before bed. ?Wound Treatment ?Wound #1 - Lower Leg Wound Laterality: Left, Medial ?Cleanser: Soap and Water Every Other Day/30 Days ?Discharge Instructions: May shower and wash wound with dial antibacterial soap and water prior to dressing change. ?Cleanser: [place] Ancillary Kit - 15 Day Supply (Generic) Every Other Day/30 Days ?Discharge Instructions: Use supplies as instructed; Kit contains: (15) Saline Bullets; (15) 3x3 Gauze; 15 pr Gloves ?Peri-Wound Care: Skin Prep (Generic) Every Other Day/30 Days ?Discharge Instructions: Use skin prep as directed ?Topical: Gentamicin Every Other Day/30 Days ?Discharge Instructions: Apply directly to wound bed. ?Prim Dressing: Hydrofera Blue Classic Foam, 2x2 in (Generic) Every Other Day/30 Days ?ary ?Discharge Instructions: Moisten with saline. Apply over the gentamicin ointment. ?Secondary Dressing: Zetuvit Plus Silicone Border  Dressing 4x4 (in/in) (Generic) Every Other Day/30 Days ?Discharge Instructions: Apply silicone border over primary dressing as directed. ?Electronic Signature(s) ?Signed: 12/24/2021 10:53:34 AM By: Brian Shan DO ?Inetta Fermo

## 2021-12-25 LAB — LIPID PANEL
Chol/HDL Ratio: 2.7 ratio (ref 0.0–5.0)
Cholesterol, Total: 140 mg/dL (ref 100–199)
HDL: 51 mg/dL (ref >39)
LDL Chol Calc (NIH): 75 mg/dL (ref 0–99)
Triglycerides: 69 mg/dL (ref 0–149)
VLDL Cholesterol Cal: 14 mg/dL (ref 5–40)

## 2021-12-25 LAB — SPECIMEN STATUS REPORT

## 2021-12-31 ENCOUNTER — Encounter (HOSPITAL_BASED_OUTPATIENT_CLINIC_OR_DEPARTMENT_OTHER): Payer: Medicare Other | Admitting: Internal Medicine

## 2022-01-02 ENCOUNTER — Other Ambulatory Visit: Payer: Self-pay

## 2022-01-02 DIAGNOSIS — T148XXA Other injury of unspecified body region, initial encounter: Secondary | ICD-10-CM

## 2022-01-03 ENCOUNTER — Other Ambulatory Visit: Payer: Self-pay | Admitting: Family Medicine

## 2022-01-03 DIAGNOSIS — R058 Other specified cough: Secondary | ICD-10-CM

## 2022-01-06 ENCOUNTER — Ambulatory Visit (HOSPITAL_COMMUNITY)
Admission: RE | Admit: 2022-01-06 | Discharge: 2022-01-06 | Disposition: A | Discharge status: Home / Self Care | Payer: Medicare Other | Source: Ambulatory Visit | Attending: Physician Assistant | Admitting: Physician Assistant

## 2022-01-06 ENCOUNTER — Ambulatory Visit (INDEPENDENT_AMBULATORY_CARE_PROVIDER_SITE_OTHER): Payer: Medicare Other | Admitting: Physician Assistant

## 2022-01-06 VITALS — BP 120/71 | HR 55 | Temp 97.7°F | Resp 20 | Ht 70.0 in | Wt 148.1 lb

## 2022-01-06 DIAGNOSIS — T148XXA Other injury of unspecified body region, initial encounter: Secondary | ICD-10-CM | POA: Diagnosis not present

## 2022-01-06 DIAGNOSIS — L0390 Cellulitis, unspecified: Secondary | ICD-10-CM | POA: Diagnosis not present
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
## 2022-01-07 ENCOUNTER — Encounter (HOSPITAL_BASED_OUTPATIENT_CLINIC_OR_DEPARTMENT_OTHER): Payer: Medicare Other | Attending: Internal Medicine | Admitting: Internal Medicine

## 2022-01-07 DIAGNOSIS — T1490XA Injury, unspecified, initial encounter: Secondary | ICD-10-CM

## 2022-01-07 DIAGNOSIS — Z87891 Personal history of nicotine dependence: Secondary | ICD-10-CM | POA: Insufficient documentation

## 2022-01-07 DIAGNOSIS — I10 Essential (primary) hypertension: Secondary | ICD-10-CM | POA: Insufficient documentation

## 2022-01-07 DIAGNOSIS — X58XXXA Exposure to other specified factors, initial encounter: Secondary | ICD-10-CM | POA: Insufficient documentation

## 2022-01-07 DIAGNOSIS — L97822 Non-pressure chronic ulcer of other part of left lower leg with fat layer exposed: Secondary | ICD-10-CM

## 2022-01-07 DIAGNOSIS — M069 Rheumatoid arthritis, unspecified: Secondary | ICD-10-CM | POA: Insufficient documentation

## 2022-01-07 DIAGNOSIS — Z85528 Personal history of other malignant neoplasm of kidney: Secondary | ICD-10-CM | POA: Insufficient documentation

## 2022-01-07 DIAGNOSIS — M353 Polymyalgia rheumatica: Secondary | ICD-10-CM

## 2022-01-07 NOTE — Progress Notes (Signed)
Brian Leon. ([id number]) ?Visit Report for 01/07/2022 ?Arrival Information Details ?Patient Name: Date of Service: ?Brian Leon, [name] 01/07/2022 9:30 A M ?Medical Record Number: [id number] ?Patient Account Number: [id number] ?Date of Birth/Sex: Treating RN: ?1946/05/21 (76 y.o. M) Brian Leon, Brian Leon ?Primary Care Dishon Kehoe: Brian Leon Other Clinician: ?Referring Mykah Shin: ?Treating Hilery Wintle/Extender: Kalman Shan ?Autry-Lott, [name] ?Weeks in Treatment: 8 ?Visit Information History Since Last Visit ?Added or deleted any medications: No ?Patient Arrived: Ambulatory ?Any new allergies or adverse reactions: No ?Arrival Time: 09:57 ?Had a fall or experienced change in No ?Accompanied By: self ?activities of daily living that may affect ?Transfer Assistance: None ?risk of falls: ?Patient Identification Verified: Yes ?Signs or symptoms of abuse/neglect since last visito No ?Secondary Verification Process Completed: Yes ?Hospitalized since last visit: No ?Patient Requires Transmission-Based Precautions: No ?Implantable device outside of the clinic excluding No ?Patient Has Alerts: Yes ?cellular tissue based products placed in the center ?Patient Alerts: ?11/02/21 L ABI: 1.37 since last visit: ?11/02/2021 L TBI 0.77 Has Dressing in Place as Prescribed: Yes ?Pain Present Now: No ?Electronic Signature(s) ?Signed: 01/07/2022 4:11:11 PM By: Deon Pilling RN, BSN ?Entered By: Deon Pilling on 01/07/2022 09:57:59 ?-------------------------------------------------------------------------------- ?Clinic Level of Care Assessment Details ?Patient Name: Date of Service: ?Brian Leon, [name] 01/07/2022 9:30 A M ?Medical Record Number: [id number] ?Patient Account Number: [id number] ?Date of Birth/Sex: Treating RN: ?19-Aug-1946 (76 y.o. M) Brian Leon, Brian Leon ?Primary Care Joash Tony: Brian Leon Other Clinician: ?Referring Brian Leon: ?Treating Darchelle Nunes/Extender: Kalman Shan ?Autry-Lott, [name] ?Weeks in Treatment: 8 ?Clinic Level of Care  Assessment Items ?TOOL 4 Quantity Score ?X- 1 0 ?Use when only an EandM is performed on FOLLOW-UP visit ?ASSESSMENTS - Nursing Assessment / Reassessment ?X- 1 10 ?Reassessment of Co-morbidities (includes updates in patient status) ?X- 1 5 ?Reassessment of Adherence to Treatment Plan ?ASSESSMENTS - Wound and Skin A ssessment / Reassessment ?X - Simple Wound Assessment / Reassessment - one wound 1 5 ?'[]'$  - 0 ?Complex Wound Assessment / Reassessment - multiple wounds ?X- 1 10 ?Dermatologic / Skin Assessment (not related to wound area) ?ASSESSMENTS - Focused Assessment ?X- 1 5 ?Circumferential Edema Measurements - multi extremities ?'[]'$  - 0 ?Nutritional Assessment / Counseling / Intervention ?'[]'$  - 0 ?Lower Extremity Assessment (monofilament, tuning fork, pulses) ?'[]'$  - 0 ?Peripheral Arterial Disease Assessment (using hand held doppler) ?ASSESSMENTS - Ostomy and/or Continence Assessment and Care ?'[]'$  - 0 ?Incontinence Assessment and Management ?'[]'$  - 0 ?Ostomy Care Assessment and Management (repouching, etc.) ?PROCESS - Coordination of Care ?X - Simple Patient / Family Education for ongoing care 1 15 ?'[]'$  - 0 ?Complex (extensive) Patient / Family Education for ongoing care ?X- 1 10 ?Staff obtains Consents, Records, T Results / Process Orders ?est ?'[]'$  - 0 ?Staff telephones HHA, Nursing Homes / Clarify orders / etc ?'[]'$  - 0 ?Routine Transfer to another Facility (non-emergent condition) ?'[]'$  - 0 ?Routine Hospital Admission (non-emergent condition) ?'[]'$  - 0 ?New Admissions / Biomedical engineer / Ordering NPWT Apligraf, etc. ?, ?'[]'$  - 0 ?Emergency Hospital Admission (emergent condition) ?X- 1 10 ?Simple Discharge Coordination ?'[]'$  - 0 ?Complex (extensive) Discharge Coordination ?PROCESS - Special Needs ?'[]'$  - 0 ?Pediatric / Minor Patient Management ?'[]'$  - 0 ?Isolation Patient Management ?'[]'$  - 0 ?Hearing / Language / Visual special needs ?'[]'$  - 0 ?Assessment of Community assistance (transportation, D/C planning, etc.) ?'[]'$  -  0 ?Additional assistance / Altered mentation ?'[]'$  - 0 ?Support Surface(s) Assessment (bed, cushion, seat, etc.) ?INTERVENTIONS - Wound Cleansing / Measurement ?X - Simple  Wound Cleansing - one wound 1 5 ?'[]'$  - 0 ?Complex Wound Cleansing - multiple wounds ?X- 1 5 ?Wound Imaging (photographs - any number of wounds) ?'[]'$  - 0 ?Wound Tracing (instead of photographs) ?X- 1 5 ?Simple Wound Measurement - one wound ?'[]'$  - 0 ?Complex Wound Measurement - multiple wounds ?INTERVENTIONS - Wound Dressings ?'[]'$  - 0 ?Small Wound Dressing one or multiple wounds ?'[]'$  - 0 ?Medium Wound Dressing one or multiple wounds ?'[]'$  - 0 ?Large Wound Dressing one or multiple wounds ?'[]'$  - 0 ?Application of Medications - topical ?'[]'$  - 0 ?Application of Medications - injection ?INTERVENTIONS - Miscellaneous ?'[]'$  - 0 ?External ear exam ?'[]'$  - 0 ?Specimen Collection (cultures, biopsies, blood, body fluids, etc.) ?'[]'$  - 0 ?Specimen(s) / Culture(s) sent or taken to Lab for analysis ?'[]'$  - 0 ?Patient Transfer (multiple staff / Civil Service fast streamer / Similar devices) ?'[]'$  - 0 ?Simple Staple / Suture removal (25 or less) ?'[]'$  - 0 ?Complex Staple / Suture removal (26 or more) ?'[]'$  - 0 ?Hypo / Hyperglycemic Management (close monitor of Blood Glucose) ?'[]'$  - 0 ?Ankle / Brachial Index (ABI) - do not check if billed separately ?X- 1 5 ?Vital Signs ?Has the patient been seen at the hospital within the last three years: Yes ?Total Score: 90 ?Level Of Care: New/Established - Level 3 ?Electronic Signature(s) ?Signed: 01/07/2022 4:11:11 PM By: Deon Pilling RN, BSN ?Entered By: Deon Pilling on 01/07/2022 10:18:32 ?-------------------------------------------------------------------------------- ?Encounter Discharge Information Details ?Patient Name: Date of Service: ?Brian Leon, [name] 01/07/2022 9:30 A M ?Medical Record Number: [id number] ?Patient Account Number: [id number] ?Date of Birth/Sex: Treating RN: ?[birth year] (76 y.o. M) Brian Leon, Brian Leon ?Primary Care Alie Moudy: Brian Leon Other  Clinician: ?Referring Malikai Gut: ?Treating Kehlani Vancamp/Extender: Kalman Shan ?Autry-Lott, [name] ?Weeks in Treatment: 8 ?Encounter Discharge Information Items ?Discharge Condition: Stable ?Ambulatory Status: Ambulatory ?Discharge Destination: Home ?Transportation: Private Auto ?Accompanied By: self ?Schedule Follow-up Appointment: Yes ?Clinical Summary of Care: ?Electronic Signature(s) ?Signed: 01/07/2022 4:11:11 PM By: Deon Pilling RN, BSN ?Entered By: Deon Pilling on 01/07/2022 10:19:08 ?-------------------------------------------------------------------------------- ?Lower Extremity Assessment Details ?Patient Name: Date of Service: ?Brian Leon, [name] 01/07/2022 9:30 A M ?Medical Record Number: [id number] ?Patient Account Number: [id number] ?Date of Birth/Sex: Treating RN: ?[birth year] (76 y.o. M) Brian Leon, Brian Leon ?Primary Care Jerrit Horen: Brian Leon Other Clinician: ?Referring Letia Guidry: ?Treating Shakevia Sarris/Extender: Kalman Shan ?Autry-Lott, [name] ?Weeks in Treatment: 8 ?Edema Assessment ?Assessed: [Left: Yes] [Right: No] ?Edema: [Left: N] [Right: o] ?Calf ?Left: Right: ?Point of Measurement: 37 cm From Medial Instep 37 cm ?Ankle ?Left: Right: ?Point of Measurement: 12 cm From Medial Instep 21 cm ?Vascular Assessment ?Pulses: ?Dorsalis Pedis ?Palpable: [Left:Yes] ?Electronic Signature(s) ?Signed: 01/07/2022 4:11:11 PM By: Deon Pilling RN, BSN ?Entered By: Deon Pilling on 01/07/2022 10:01:30 ?-------------------------------------------------------------------------------- ?Multi Wound Chart Details ?Patient Name: ?Date of Service: ?Brian Leon, [place] NN P. 01/07/2022 9:30 A M ?Medical Record Number: [id number] ?Patient Account Number: [id number] ?Date of Birth/Sex: ?Treating RN: ?1946-08-10 (76 y.o. [name] ?Primary Care Jasaun Carn: Brian Leon ?Other Clinician: ?Referring Deaja Rizo: ?Treating Asmaa Tirpak/Extender: Kalman Shan ?Autry-Lott, [name] ?Weeks in Treatment: 8 ?Vital Signs ?Height(in):  68 ?Pulse(bpm): 71 ?Weight(lbs): 140 ?Blood Pressure(mmHg): 137/76 ?Body Mass Index(BMI): 21.3 ?Temperature(??F): 98.6 ?Respiratory Rate(breaths/min): 20 ?Photos: [N/A:N/A] ?Left, Medial Lower Leg N/A N/A ?Wound Location: ?Tra

## 2022-01-07 NOTE — Progress Notes (Signed)
AVETT, Brian Leon. ([id number]) ?Visit Report for 01/07/2022 ?Chief Complaint Document Details ?Patient Name: Date of Service: ?Brian Leon, [name] Leon. 01/07/2022 9:30 A M ?Medical Record Number: [id number] ?Patient Account Number: [id number] ?Date of Birth/Sex: Treating RN: ?Nov 05, 1945 (76 y.o. Brian Leon ?Primary Care Provider: Gerlene Leon Other Clinician: ?Referring Provider: ?Treating Provider/Extender: Brian Leon ?Autry-Lott, Brian Leon ?Weeks in Treatment: 8 ?Information Obtained from: Patient ?Chief Complaint ?Left lower extremity wound due to trauma ?Electronic Signature(s) ?Signed: 01/07/2022 11:10:25 AM By: Brian Shan DO ?Entered By: Brian Leon on 01/07/2022 10:17:35 ?-------------------------------------------------------------------------------- ?HPI Details ?Patient Name: Date of Service: ?Brian Leon, [name] Leon. 01/07/2022 9:30 A M ?Medical Record Number: [id number] ?Patient Account Number: [id number] ?Date of Birth/Sex: Treating RN: ?1946-09-13 (76 y.o. Brian Leon ?Primary Care Provider: Gerlene Leon Other Clinician: ?Referring Provider: ?Treating Provider/Extender: Brian Leon ?Autry-Lott, Brian Leon ?Weeks in Treatment: 8 ?History of Present Illness ?HPI Description: Admission 11/11/2021 ?Mr. Curvin Minton is a 76 year old male with a past medical history of polymyalgia rheumatica that presents to the clinic for a 72-monthhistory of nonhealing ?wound to the left lower extremity. He states that on Halloween he hit an object and created a wound that has not healed. He states he has seen dermatology ?and his primary care physician for this issue. On one occasion he had an UHaematologist He has also used Xeroform. It does not sound like he uses any kind of ?wound dressing consistently. He currently reports chronic pain to the wound site. He denies systemic signs of infection. He denies purulent drainage. ?2/16; patient presents for follow-up. He has been using Hydrofera Blue and gentamicin  ointment to the wound bed without issues. He reports improvement in ?wound healing. He currently denies signs of infection. ?2/23; patient presents for follow-up. He continues to use Hydrofera Blue and gentamicin ointment to the wound bed without issues. He denies signs of ?infection. He reports improvement in his chronic pain to the wound bed. ?3/2; patient presents for follow-up. He has been using Hydrofera Blue and gentamicin to the wound bed without issues. [place] He currently denies signs of infection. ?3/24; patient presents for follow-up. [place] He enjoyed his trip. He reports no issues today. He has been ?using Hydrofera Blue and gentamicin ointment to the wound beds without issues. ?4/7; patient presents for follow-up. He has been using Hydrofera Blue to the wound bed without issues. He reports that his wound is healed. ?Electronic Signature(s) ?Signed: 01/07/2022 11:10:25 AM By: HKalman ShanDO ?Entered By: HKalman Shanon 01/07/2022 10:18:22 ?-------------------------------------------------------------------------------- ?Physical Exam Details ?Patient Name: Date of Service: ?Brian Leon. 01/07/2022 9:30 A M ?Medical Record Number: [id number]?Patient Account Number: [id number]?Date of Birth/Sex: Treating RN: ?[birth year]((76y.o. MMarcheta Leon?Primary Care Provider: AGerlene FeeOther Clinician: ?Referring Provider: ?Treating Provider/Extender: HKalman Leon?Autry-Lott, SNaaman Leon?Weeks in Treatment: 8 ?Constitutional ?respirations regular, non-labored and within target range for patient..Marland Kitchen?Cardiovascular ?2+ dorsalis pedis/posterior tibialis pulses. ?Psychiatric ?pleasant and cooperative. ?Notes ?Left lower extremity: Epithelization to the previous wound site. ?Electronic Signature(s) ?Signed: 01/07/2022 11:10:25 AM By: HKalman ShanDO ?Entered By: HKalman Shanon 01/07/2022  10:19:08 ?-------------------------------------------------------------------------------- ?Physician Orders Details ?Patient Name: Date of Service: ?Brian Leon. 01/07/2022 9:30 A M ?Medical Record Number: [id number]?Patient Account Number: [id number]?Date of Birth/Sex: Treating RN: ?402-Mar-1947((76y.o. M) DRolin Barry Bobbi ?Primary Care  Provider: Gerlene Leon Other Clinician: ?Referring Provider: ?Treating Provider/Extender: Brian Leon ?Autry-Lott, Brian Leon ?Weeks in Treatment: 8 ?Verbal / Phone Orders: No ?Diagnosis Coding ?Discharge From [facility] ?Discharge from [place] - Call if any future wound care needs. ?cover the area to left leg bandaid and gentamicin ointment. ?protect closed area x2 weeks. ?Electronic Signature(s) ?Signed: 01/07/2022 11:10:25 AM By: Brian Shan DO ?Entered By: Brian Leon on 01/07/2022 10:19:26 ?-------------------------------------------------------------------------------- ?Problem List Details ?Patient Name: Date of Service: ?Brian Leon, [name] Leon. 01/07/2022 9:30 A M ?Medical Record Number: [id number] ?Patient Account Number: [id number] ?Date of Birth/Sex: Treating RN: ?03/29/1946 (76 y.o. Brian Leon ?Primary Care Provider: Gerlene Leon Other Clinician: ?Referring Provider: ?Treating Provider/Extender: Brian Leon ?Autry-Lott, Brian Leon ?Weeks in Treatment: 8 ?Active Problems ?ICD-10 ?Encounter ?Code Description Active Date MDM ?Diagnosis ?B58.309 Non-pressure chronic ulcer of other part of left lower leg with fat layer exposed2/06/2022 No Yes ?T14.90XA Injury, unspecified, initial encounter 11/11/2021 No Yes ?M35.3 Polymyalgia rheumatica 11/11/2021 No Yes ?Inactive Problems ?Resolved Problems ?Electronic Signature(s) ?Signed: 01/07/2022 11:10:25 AM By: Brian Shan DO ?Entered By: Brian Leon on 01/07/2022 10:16:41 ?-------------------------------------------------------------------------------- ?Progress Note Details ?Patient Name: ?Date of  Service: ?Brian Leon, [place] NN Leon. 01/07/2022 9:30 A M ?Medical Record Number: [id number] ?Patient Account Number: [id number] ?Date of Birth/Sex: ?Treating RN: ?08-15-1946 (76 y.o. Brian Leon ?Primary Care Provider: Gerlene Leon ?Other Clinician: ?Referring Provider: ?Treating Provider/Extender: Brian Leon ?Autry-Lott, Brian Leon ?Weeks in Treatment: 8 ?Subjective ?Chief Complaint ?Information obtained from Patient ?Left lower extremity wound due to trauma ?History of Present Illness (HPI) ?Admission 11/11/2021 ?Mr. Juanjesus Antrim is a 76 year old male with a past medical history of polymyalgia rheumatica that presents to the clinic for a 36-monthhistory of nonhealing ?wound to the left lower extremity. He states that on Halloween he hit an object and created a wound that has not healed. He states he has seen dermatology ?and his primary care physician for this issue. On one occasion he had an UHaematologist He has also used Xeroform. It does not sound like he uses any kind of ?wound dressing consistently. He currently reports chronic pain to the wound site. He denies systemic signs of infection. He denies purulent drainage. ?2/16; patient presents for follow-up. He has been using Hydrofera Blue and gentamicin ointment to the wound bed without issues. He reports improvement in ?wound healing. He currently denies signs of infection. ?2/23; patient presents for follow-up. He continues to use Hydrofera Blue and gentamicin ointment to the wound bed without issues. He denies signs of ?infection. He reports improvement in his chronic pain to the wound bed. ?3/2; patient presents for follow-up. He has been using Hydrofera Blue and gentamicin to the wound bed without issues. [place] He currently denies signs of infection. ?3/24; patient presents for follow-up. [place] He enjoyed his trip. He reports no issues today. He has  been ?using Hydrofera Blue and gentamicin ointment to the wound beds without issues. ?4/7; patient presents for follow-up. He has been using Hydrofera Blue to the wound bed without issues. He reports that his wound i

## 2022-01-10 NOTE — Progress Notes (Signed)
Letter sent after 3 call atts. ?

## 2022-02-03 ENCOUNTER — Encounter (HOSPITAL_BASED_OUTPATIENT_CLINIC_OR_DEPARTMENT_OTHER): Payer: Medicare Other | Attending: Internal Medicine | Admitting: Internal Medicine

## 2022-02-03 DIAGNOSIS — Z87891 Personal history of nicotine dependence: Secondary | ICD-10-CM | POA: Insufficient documentation

## 2022-02-03 DIAGNOSIS — L03115 Cellulitis of right lower limb: Secondary | ICD-10-CM

## 2022-02-03 DIAGNOSIS — T1490XA Injury, unspecified, initial encounter: Secondary | ICD-10-CM | POA: Insufficient documentation

## 2022-02-03 DIAGNOSIS — M353 Polymyalgia rheumatica: Secondary | ICD-10-CM | POA: Diagnosis not present

## 2022-02-03 DIAGNOSIS — L97822 Non-pressure chronic ulcer of other part of left lower leg with fat layer exposed: Secondary | ICD-10-CM | POA: Diagnosis not present

## 2022-02-03 DIAGNOSIS — I872 Venous insufficiency (chronic) (peripheral): Secondary | ICD-10-CM | POA: Diagnosis not present

## 2022-02-03 DIAGNOSIS — B957 Other staphylococcus as the cause of diseases classified elsewhere: Secondary | ICD-10-CM | POA: Diagnosis not present

## 2022-02-03 DIAGNOSIS — L97912 Non-pressure chronic ulcer of unspecified part of right lower leg with fat layer exposed: Secondary | ICD-10-CM | POA: Insufficient documentation

## 2022-02-03 DIAGNOSIS — L080 Pyoderma: Secondary | ICD-10-CM | POA: Diagnosis not present

## 2022-02-03 NOTE — Progress Notes (Signed)
PERLE, Brian Leon. ([id number]) ?Visit Report for 02/03/2022 ?Arrival Information Details ?Patient Name: Date of Service: ?Brian Leon, [name] 02/03/2022 8:15 A M ?Medical Record Number: [id number] ?Patient Account Number: [id number] ?Date of Birth/Sex: Treating RN: ?1946-01-07 (76 y.o. M) Brian Leon, Brian ?Primary Care Dashae Wilcher: Gerlene Fee Other Clinician: ?Referring Cody Oliger: ?Treating Kelce Bouton/Extender: Kalman Shan ?Autry-Lott, [name] ?Weeks in Treatment: 12 ?Visit Information History Since Last Visit ?Added or deleted any medications: No ?Patient Arrived: Ambulatory ?Any new allergies or adverse reactions: No ?Arrival Time: 08:26 ?Had a fall or experienced change in No ?Accompanied By: self ?activities of daily living that may affect ?Transfer Assistance: None ?risk of falls: ?Patient Identification Verified: Yes ?Signs or symptoms of abuse/neglect since last visito No ?Secondary Verification Process Completed: Yes ?Hospitalized since last visit: No ?Patient Requires Transmission-Based Precautions: No ?Implantable device outside of the clinic excluding No ?Patient Has Alerts: Yes ?cellular tissue based products placed in the center ?Patient Alerts: ?11/02/21 L ABI: 1.37 since last visit: ?11/02/2021 L TBI 0.77 Has Dressing in Place as Prescribed: Yes ?11/02/2021 R TBI 0.69 ?Pain Present Now: Yes ?11/02/2021 R ABI 1.3 ?Electronic Signature(s) ?Signed: 02/03/2022 5:16:25 PM By: Deon Pilling RN, BSN ?Entered By: Deon Pilling on 02/03/2022 08:37:53 ?-------------------------------------------------------------------------------- ?Encounter Discharge Information Details ?Patient Name: Date of Service: ?Brian Leon, [name] 02/03/2022 8:15 A M ?Medical Record Number: [id number] ?Patient Account Number: [id number] ?Date of Birth/Sex: Treating RN: ?1946-05-01 (76 y.o. M) Brian Leon, Brian ?Primary Care Ahijah Devery: Gerlene Fee Other Clinician: ?Referring Eesha Schmaltz: ?Treating Trayshawn Durkin/Extender: Kalman Shan ?Autry-Lott,  [name] ?Weeks in Treatment: 12 ?Encounter Discharge Information Items Post Procedure Vitals ?Discharge Condition: Stable ?Temperature (F): 98.2 ?Ambulatory Status: Ambulatory ?Pulse (bpm): 64 ?Discharge Destination: Home ?Respiratory Rate (breaths/min): 20 ?Transportation: Private Auto ?Blood Pressure (mmHg): 150/76 ?Accompanied By: self ?Schedule Follow-up Appointment: Yes ?Clinical Summary of Care: ?Electronic Signature(s) ?Signed: 02/03/2022 5:16:25 PM By: Deon Pilling RN, BSN ?Entered By: Deon Pilling on 02/03/2022 09:06:49 ?-------------------------------------------------------------------------------- ?Lower Extremity Assessment Details ?Patient Name: ?Date of Service: ?Brian Leon, [name] 02/03/2022 8:15 A M ?Medical Record Number: [id number] ?Patient Account Number: [id number] ?Date of Birth/Sex: ?Treating RN: ?1946-08-19 (76 y.o. M) Brian Leon, Brian ?Primary Care Analissa Bayless: Gerlene Fee ?Other Clinician: ?Referring Verma Grothaus: ?Treating Amanuel Sinkfield/Extender: Kalman Shan ?Autry-Lott, [name] ?Weeks in Treatment: 12 ?Edema Assessment ?Assessed: [Left: Yes] [Right: Yes] ?Edema: [Left: Yes] [Right: Yes] ?Calf ?Left: Right: ?Point of Measurement: 37 cm From Medial Instep 30.5 cm 31 cm ?Ankle ?Left: Right: ?Point of Measurement: 12 cm From Medial Instep 20.5 cm 21.5 cm ?Vascular Assessment ?Pulses: ?Dorsalis Pedis ?Palpable: [Left:Yes] [Right:Yes] ?Electronic Signature(s) ?Signed: 02/03/2022 5:16:25 PM By: Deon Pilling RN, BSN ?Entered By: Deon Pilling on 02/03/2022 08:29:58 ?-------------------------------------------------------------------------------- ?Multi Wound Chart Details ?Patient Name: ?Date of Service: ?Brian Leon, [name] 02/03/2022 8:15 A M ?Medical Record Number: [id number] ?Patient Account Number: [id number] ?Date of Birth/Sex: ?Treating RN: ?[birth year] (76 y.o. M) Brian Leon, Brian ?Primary Care Tzivia Oneil: Gerlene Fee ?Other Clinician: ?Referring Poetry Cerro: ?Treating Jeston Junkins/Extender: Kalman Shan ?Autry-Lott, [name] ?Weeks in Treatment: 12 ?Vital Signs ?Height(in): 68 ?Pulse(bpm): 64 ?Weight(lbs): 140 ?Blood Pressure(mmHg): 150/76 ?Body Mass Index(BMI): 21.3 ?Temperature(??F): 98.2 ?Respiratory Rate(breaths/min): 20 ?Photos: [2:Left, Anterior Lower Leg] [3:Right, Medial Lower Leg] [4:Right, Anterior Lower Leg] ?Wound Location: [2:Trauma] [3:Trauma] [4:Trauma] ?Wounding Event: [2:Abrasion] [3:Abrasion] [4:Abrasion] ?Primary Etiology: [2:Asthma, Hypertension, Hepatitis A, Asthma, Hypertension, Hepatitis A, Asthma, Hypertension, Hepatitis A,] ?Comorbid History: [2:Rheumatoid Arthritis 01/04/2022] [3:Rheumatoid Arthritis 01/24/2022] [4:Rheumatoid Arthritis 01/04/2022] ?Date Acquired: [2:0] [3:0] [4:0] ?Weeks of Treatment: [2:Open] [3:Open] [4:Open] ?Wound Status: [2:No] [3:No] [  4:No] ?Wound Recurrence: [2:0.9x0.6x0.1] [3:4.5x3.5x0.1] [4:0.7x0.6x0.1] ?Measurements L x W x D (cm) [2:0.424] [3:12.37] [4:0.33] ?A (cm?) : ?rea [2:0.042] [3:1.237] [4:0.033] ?Volume (cm?) : [2:0.00%] [3:0.00%] [4:0.00%] ?% Reduction in A [2:rea: 0.00%] [3:0.00%] [4:0.00%] ?% Reduction in Volume: [2:Full Thickness Without Exposed] [3:Full Thickness Without Exposed] [4:Full Thickness Without Exposed] ?Classification: [2:Support Structures Medium] [3:Support Structures Medium] [4:Support Structures Medium] ?Exudate A mount: [2:Serosanguineous] [3:Serosanguineous] [4:Serosanguineous] ?Exudate Type: [2:red, brown] [3:red, brown] [4:red, brown] ?Exudate Color: [2:Distinct, outline attached] [3:Distinct, outline attached] [4:Distinct, outline attached] ?Wound Margin: [2:Small (1-33%)] [3:Small (1-33%)] [4:Small (1-33%)] ?Granulation A mount: [2:Red, Pink] [3:Red, Pink] [4:Red, Pink] ?Granulation Quality: [2:Large (67-100%)] [3:Large (67-100%)] [4:Large (67-100%)] ?Necrotic A mount: ?[2:Fat Layer (Subcutaneous Tissue): Yes Fat Layer (Subcutaneous Tissue): Yes Fat Layer (Subcutaneous Tissue): Yes] ?Exposed Structures: ?[2:Fascia: No Tendon:  No Muscle: No Joint: No Bone: No Small (1-33%)] [3:Fascia: No Tendon: No Muscle: No Joint: No Bone: No Small (1-33%)] [4:Fascia: No Tendon: No Muscle: No Joint: No Bone: No None] ?Epithelialization: [2:Debridement - Selective/Open Wound Debridement - Selective/Open Wound Debridement - Selective/Open Wound] ?Debridement: ?Pre-procedure Verification/Time Out 08:50 [3:08:50] [4:08:50] ?Taken: [2:Lidocaine 5% topical ointment] [3:Lidocaine 5% topical ointment] [4:Lidocaine 5% topical ointment] ?Pain Control: [2:Slough] [3:Slough] [4:Slough] ?Tissue Debrided: [2:Non-Viable Tissue] [3:Non-Viable Tissue] [4:Non-Viable Tissue] ?Level: [2:0.54] [3:15.75] [4:0.42] ?Debridement A (sq cm): [2:rea Curette] [3:Curette] [4:Curette] ?Instrument: [2:Minimum] [3:Minimum] [4:Minimum] ?Bleeding: [2:Pressure] [3:Pressure] [4:Pressure] ?Hemostasis A chieved: [2:0] [3:0] [4:0] ?Procedural Pain: [2:0] [3:0] [4:0] ?Post Procedural Pain: [2:Procedure was tolerated well] [3:Procedure was tolerated well] [4:Procedure was tolerated well] ?Debridement Treatment Response: [2:0.9x0.6x0.1] [3:4.5x3.5x0.1] [4:0.7x0.6x0.1] ?Post Debridement Measurements L x ?W x D (cm) [2:0.042] [3:1.237] [4:0.033] ?Post Debridement Volume: (cm?) [2:Debridement] [3:Debridement] [4:Debridement] ?Treatment Notes ?Wound #2 (Lower Leg) Wound Laterality: Left, Anterior ?Cleanser ?Soap and Water ?Discharge Instruction: May shower and wash wound with dial antibacterial soap and water prior to dressing change. ?Peri-Wound Care ?Skin Prep ?Discharge Instruction: Use skin prep as directed ?Topical ?Gentamicin ?Discharge Instruction: apply directly to wound beds. ?Primary Dressing ?Hydrofera Blue Ready Foam, 2.5 x2.5 in ?Discharge Instruction: Apply over the gentamicin ointment. ?Secondary Dressing ?Bordered Gauze, 2x3.75 in ?Discharge Instruction: Apply over primary dressing as directed. ?Secured With ?Compression Wrap ?Compression Stockings ?Add-Ons ?Wound #3 (Lower Leg)  Wound Laterality: Right, Medial ?Cleanser ?Soap and Water ?Discharge Instruction: May shower and wash wound with dial antibacterial soap and water prior to dressing change. ?Peri-Wound Care ?Skin Prep ?Discharge Instruction

## 2022-02-03 NOTE — Progress Notes (Signed)
ROSE, HIPPLER. ([id number]) ?Visit Report for 02/03/2022 ?Chief Complaint Document Details ?Patient Name: Date of Service: ?Brian Leon, [name] 02/03/2022 8:15 A M ?Medical Record Number: [id number] ?Patient Account Number: [id number] ?Date of Birth/Sex: Treating RN: ?September 14, 1946 (76 y.o. M) [name], Bobbi ?Primary Care Provider: Gerlene Fee Other Clinician: ?Referring Provider: ?Treating Provider/Extender: Kalman Shan ?Autry-Lott, [name] ?Weeks in Treatment: 12 ?Information Obtained from: Patient ?Chief Complaint ?11/11/2021; Left lower extremity wound due to trauma ?02/03/2022; bilateral lower extremity wounds due to trauma ?Electronic Signature(s) ?Signed: 02/03/2022 10:14:53 AM By: Kalman Shan DO ?Entered By: Kalman Shan on 02/03/2022 09:51:28 ?-------------------------------------------------------------------------------- ?Debridement Details ?Patient Name: Date of Service: ?Brian Leon, [name] 02/03/2022 8:15 A M ?Medical Record Number: [id number] ?Patient Account Number: [id number] ?Date of Birth/Sex: Treating RN: ?[birth year] (76 y.o. M) [name], Bobbi ?Primary Care Provider: Gerlene Fee Other Clinician: ?Referring Provider: ?Treating Provider/Extender: Kalman Shan ?Autry-Lott, [name] ?Weeks in Treatment: 12 ?Debridement Performed for Assessment: Wound #2 Left,Anterior Lower Leg ?Performed By: Physician Kalman Shan, DO ?Debridement Type: Debridement ?Level of Consciousness (Pre-procedure): Awake and Alert ?Pre-procedure Verification/Time Out Yes - 08:50 ?Taken: ?Start Time: 08:51 ?Pain Control: Lidocaine 5% topical ointment ?T Area Debrided (L x W): ?otal 0.9 (cm) x 0.6 (cm) = 0.54 (cm?) ?Tissue and other material debrided: Viable, Non-Viable, Slough, [place] ?Level: Non-Viable Tissue ?Debridement Description: Selective/Open Wound ?Instrument: Curette ?Bleeding: Minimum ?Hemostasis Achieved: Pressure ?End Time: 08:56 ?Procedural Pain: 0 ?Post Procedural Pain: 0 ?Response to Treatment:  Procedure was tolerated well ?Level of Consciousness (Post- Awake and Alert ?procedure): ?Post Debridement Measurements of Total Wound ?Length: (cm) 0.9 ?Width: (cm) 0.6 ?Depth: (cm) 0.1 ?Volume: (cm?) 0.042 ?Character of Wound/Ulcer Post Debridement: Requires Further Debridement ?Post Procedure Diagnosis ?Same as Pre-procedure ?Electronic Signature(s) ?Signed: 02/03/2022 10:14:53 AM By: Kalman Shan DO ?Signed: 02/03/2022 5:16:25 PM By: Deon Pilling RN, BSN ?Entered By: Deon Pilling on 02/03/2022 08:58:51 ?-------------------------------------------------------------------------------- ?Debridement Details ?Patient Name: ?Date of Service: ?Brian Leon, [name] 02/03/2022 8:15 A M ?Medical Record Number: [id number] ?Patient Account Number: [id number] ?Date of Birth/Sex: ?Treating RN: ?29-Mar-1946 (76 y.o. M) [name], Bobbi ?Primary Care Provider: Gerlene Fee ?Other Clinician: ?Referring Provider: ?Treating Provider/Extender: Kalman Shan ?Autry-Lott, [name] ?Weeks in Treatment: 12 ?Debridement Performed for Assessment: Wound #3 Right,Medial Lower Leg ?Performed By: Physician Kalman Shan, DO ?Debridement Type: Debridement ?Level of Consciousness (Pre-procedure): Awake and Alert ?Pre-procedure Verification/Time Out Yes - 08:50 ?Taken: ?Start Time: 08:51 ?Pain Control: Lidocaine 5% topical ointment ?T Area Debrided (L x W): ?otal 4.5 (cm) x 3.5 (cm) = 15.75 (cm?) ?Tissue and other material debrided: Viable, Non-Viable, Slough, Fibrin/Exudate, [place] ?Level: Non-Viable Tissue ?Debridement Description: Selective/Open Wound ?Instrument: Curette ?Bleeding: Minimum ?Hemostasis Achieved: Pressure ?End Time: 08:56 ?Procedural Pain: 0 ?Post Procedural Pain: 0 ?Response to Treatment: Procedure was tolerated well ?Level of Consciousness (Post- Awake and Alert ?procedure): ?Post Debridement Measurements of Total Wound ?Length: (cm) 4.5 ?Width: (cm) 3.5 ?Depth: (cm) 0.1 ?Volume: (cm?) 1.237 ?Character of Wound/Ulcer Post  Debridement: Requires Further Debridement ?Post Procedure Diagnosis ?Same as Pre-procedure ?Electronic Signature(s) ?Signed: 02/03/2022 10:14:53 AM By: Kalman Shan DO ?Signed: 02/03/2022 5:16:25 PM By: Deon Pilling RN, BSN ?Entered By: Deon Pilling on 02/03/2022 08:59:03 ?-------------------------------------------------------------------------------- ?Debridement Details ?Patient Name: ?Date of Service: ?Brian Leon, [name] 02/03/2022 8:15 A M ?Medical Record Number: [id number] ?Patient Account Number: [id number] ?Date of Birth/Sex: ?Treating RN: ?[birth year] (75 y.o. M) [name], Bobbi ?Primary Care Provider: ?Other Clinician: ?Autry-Lott, [name] ?Referring Provider: ?Treating Provider/Extender: Kalman Shan ?Autry-Lott, [name] ?Weeks in Treatment: 12 ?Debridement  Performed for Assessment: Wound #4 Right,Anterior Lower Leg ?Performed By: Physician Kalman Shan, DO ?Debridement Type: Debridement ?Level of Consciousness (Pre-procedure): Awake and Alert ?Pre-procedure Verification/Time Out Yes - 08:50 ?Taken: ?Start Time: 08:51 ?Pain Control: Lidocaine 5% topical ointment ?T Area Debrided (L x W): ?otal 0.7 (cm) x 0.6 (cm) = 0.42 (cm?) ?Tissue and other material debrided: Viable, Non-Viable, Slough, Fibrin/Exudate, [place] ?Level: Non-Viable Tissue ?Debridement Description: Selective/Open Wound ?Instrument: Curette ?Bleeding: Minimum ?Hemostasis Achieved: Pressure ?End Time: 08:56 ?Procedural Pain: 0 ?Post Procedural Pain: 0 ?Response to Treatment: Procedure was tolerated well ?Level of Consciousness (Post- Awake and Alert ?procedure): ?Post Debridement Measurements of Total Wound ?Length: (cm) 0.7 ?Width: (cm) 0.6 ?Depth: (cm) 0.1 ?Volume: (cm?) 0.033 ?Character of Wound/Ulcer Post Debridement: Requires Further Debridement ?Post Procedure Diagnosis ?Same as Pre-procedure ?Electronic Signature(s) ?Signed: 02/03/2022 10:14:53 AM By: Kalman Shan DO ?Signed: 02/03/2022 5:16:25 PM By: Deon Pilling RN, BSN ?Entered By:  Deon Pilling on 02/03/2022 08:59:50 ?-------------------------------------------------------------------------------- ?HPI Details ?Patient Name: ?Date of Service: ?Brian Leon, [name] 02/03/2022 8:15 A M ?Medical Record Number: [id number] ?Patient Account Number: [id number] ?Date of Birth/Sex: ?Treating RN: ?27-Nov-1945 (76 y.o. M) [name], Bobbi ?Primary Care Provider: Gerlene Fee ?Other Clinician: ?Referring Provider: ?Treating Provider/Extender: Kalman Shan ?Autry-Lott, [name] ?Weeks in Treatment: 12 ?History of Present Illness ?HPI Description: Admission 11/11/2021 ?Mr. Kenechukwu Vanbergen is a 76 year old male with a past medical history of polymyalgia rheumatica that presents to the clinic for a 39-monthhistory of nonhealing ?wound to the left lower extremity. He states that on Halloween he hit an object and created a wound that has not healed. He states he has seen dermatology ?and his primary care physician for this issue. On one occasion he had an UHaematologist He has also used Xeroform. It does not sound like he uses any kind of ?wound dressing consistently. He currently reports chronic pain to the wound site. He denies systemic signs of infection. He denies purulent drainage. ?2/16; patient presents for follow-up. He has been using Hydrofera Blue and gentamicin ointment to the wound bed without issues. He reports improvement in ?wound healing. He currently denies signs of infection. ?2/23; patient presents for follow-up. He continues to use Hydrofera Blue and gentamicin ointment to the wound bed without issues. He denies signs of ?infection. He reports improvement in his chronic pain to the wound bed. ?3/2; patient presents for follow-up. He has been using Hydrofera Blue and gentamicin to the wound bed without issues. [place] He currently denies signs of infection. ?3/24; patient presents for follow-up. [place] He  enjoyed his trip. He reports no issues today. He has been ?using Hydrofera Blue and gentamicin ointment to the wound beds without issues. ?4/7; patient presents for follow-up. He has been using Hydrofera B

## 2022-02-07 ENCOUNTER — Other Ambulatory Visit: Payer: Self-pay | Admitting: Family Medicine

## 2022-02-07 DIAGNOSIS — R058 Other specified cough: Secondary | ICD-10-CM

## 2022-02-10 ENCOUNTER — Encounter (HOSPITAL_BASED_OUTPATIENT_CLINIC_OR_DEPARTMENT_OTHER): Payer: Medicare Other | Admitting: Internal Medicine

## 2022-02-10 DIAGNOSIS — Z87891 Personal history of nicotine dependence: Secondary | ICD-10-CM | POA: Diagnosis not present

## 2022-02-10 DIAGNOSIS — L97822 Non-pressure chronic ulcer of other part of left lower leg with fat layer exposed: Secondary | ICD-10-CM

## 2022-02-10 DIAGNOSIS — L97912 Non-pressure chronic ulcer of unspecified part of right lower leg with fat layer exposed: Secondary | ICD-10-CM

## 2022-02-10 DIAGNOSIS — T1490XA Injury, unspecified, initial encounter: Secondary | ICD-10-CM | POA: Diagnosis not present

## 2022-02-10 DIAGNOSIS — L03115 Cellulitis of right lower limb: Secondary | ICD-10-CM

## 2022-02-10 DIAGNOSIS — I872 Venous insufficiency (chronic) (peripheral): Secondary | ICD-10-CM | POA: Diagnosis not present

## 2022-02-10 DIAGNOSIS — M353 Polymyalgia rheumatica: Secondary | ICD-10-CM | POA: Diagnosis not present

## 2022-02-10 NOTE — Progress Notes (Signed)
[name], [name]. ([id number]) ?Visit Report for 02/10/2022 ?Arrival Information Details ?Patient Name: Date of Service: ?Brian Leon, [name] 02/10/2022 8:15 A M ?Medical Record Number: [id number] ?Patient Account Number: [id number] ?Date of Birth/Sex: Treating RN: ?07/22/1946 (76 y.o. M) Brian Leon, Brian Leon ?Primary Care Fateh Kindle: Brian Leon Other Clinician: ?Referring Sharnetta Gielow: ?Treating Alycea Segoviano/Extender: Kalman Shan ?Autry-Lott, Brian Leon ?Weeks in Treatment: 13 ?Visit Information History Since Last Visit ?Added or deleted any medications: No ?Patient Arrived: Ambulatory ?Any new allergies or adverse reactions: No ?Arrival Time: 08:40 ?Had a fall or experienced change in No ?Accompanied By: self ?activities of daily living that may affect ?Transfer Assistance: None ?risk of falls: ?Patient Identification Verified: Yes ?Signs or symptoms of abuse/neglect since last visito No ?Secondary Verification Process Completed: Yes ?Hospitalized since last visit: No ?Patient Requires Transmission-Based Precautions: No ?Implantable device outside of the clinic excluding No ?Patient Has Alerts: Yes ?cellular tissue based products placed in the center ?Patient Alerts: ?11/02/21 L ABI: 1.37 since last visit: ?11/02/2021 L TBI 0.77 Has Dressing in Place as Prescribed: Yes ?11/02/2021 R TBI 0.69 ?Pain Present Now: Yes ?11/02/2021 R ABI 1.3 ?Notes ?per patient vomited this morning and per patient fever and chills started this morning. ?Electronic Signature(s) ?Signed: 02/10/2022 4:47:21 PM By: Deon Pilling RN, BSN ?Entered By: Deon Pilling on 02/10/2022 08:41:00 ?-------------------------------------------------------------------------------- ?Clinic Level of Care Assessment Details ?Patient Name: Date of Service: ?Brian Leon, [name] 02/10/2022 8:15 A M ?Medical Record Number: [id number] ?Patient Account Number: [id number] ?Date of Birth/Sex: Treating RN: ?Jul 22, 1946 (76 y.o. M) Brian Leon, Brian Leon ?Primary Care Ashden Sonnenberg: Brian Leon Other  Clinician: ?Referring Stepen Prins: ?Treating Missey Hasley/Extender: Kalman Shan ?Autry-Lott, Brian Leon ?Weeks in Treatment: 13 ?Clinic Level of Care Assessment Items ?TOOL 4 Quantity Score ?X- 1 0 ?Use when only an EandM is performed on FOLLOW-UP visit ?ASSESSMENTS - Nursing Assessment / Reassessment ?X- 1 10 ?Reassessment of Co-morbidities (includes updates in patient status) ?X- 1 5 ?Reassessment of Adherence to Treatment Plan ?ASSESSMENTS - Wound and Skin A ssessment / Reassessment ?'[]'$  - 0 ?Simple Wound Assessment / Reassessment - one wound ?X- 3 5 ?Complex Wound Assessment / Reassessment - multiple wounds ?X- 1 10 ?Dermatologic / Skin Assessment (not related to wound area) ?ASSESSMENTS - Focused Assessment ?X- 2 5 ?Circumferential Edema Measurements - multi extremities ?X- 1 10 ?Nutritional Assessment / Counseling / Intervention ?'[]'$  - 0 ?Lower Extremity Assessment (monofilament, tuning fork, pulses) ?'[]'$  - 0 ?Peripheral Arterial Disease Assessment (using hand held doppler) ?ASSESSMENTS - Ostomy and/or Continence Assessment and Care ?'[]'$  - 0 ?Incontinence Assessment and Management ?'[]'$  - 0 ?Ostomy Care Assessment and Management (repouching, etc.) ?PROCESS - Coordination of Care ?'[]'$  - 0 ?Simple Patient / Family Education for ongoing care ?X- 1 20 ?Complex (extensive) Patient / Family Education for ongoing care ?X- 1 10 ?Staff obtains Consents, Records, T Results / Process Orders ?est ?X- 1 10 ?Staff telephones HHA, Nursing Homes / Clarify orders / etc ?'[]'$  - 0 ?Routine Transfer to another Facility (non-emergent condition) ?'[]'$  - 0 ?Routine Hospital Admission (non-emergent condition) ?'[]'$  - 0 ?New Admissions / Biomedical engineer / Ordering NPWT Apligraf, etc. ?, ?'[]'$  - 0 ?Emergency Hospital Admission (emergent condition) ?'[]'$  - 0 ?Simple Discharge Coordination ?X- 1 15 ?Complex (extensive) Discharge Coordination ?PROCESS - Special Needs ?'[]'$  - 0 ?Pediatric / Minor Patient Management ?'[]'$  - 0 ?Isolation Patient  Management ?'[]'$  - 0 ?Hearing / Language / Visual special needs ?'[]'$  - 0 ?Assessment of Community assistance (transportation, D/C planning, etc.) ?'[]'$  - 0 ?Additional assistance /  Altered mentation ?'[]'$  - 0 ?Support Surface(s) Assessment (bed, cushion, seat, etc.) ?INTERVENTIONS - Wound Cleansing / Measurement ?'[]'$  - 0 ?Simple Wound Cleansing - one wound ?X- 3 5 ?Complex Wound Cleansing - multiple wounds ?X- 1 5 ?Wound Imaging (photographs - any number of wounds) ?'[]'$  - 0 ?Wound Tracing (instead of photographs) ?'[]'$  - 0 ?Simple Wound Measurement - one wound ?X- 3 5 ?Complex Wound Measurement - multiple wounds ?INTERVENTIONS - Wound Dressings ?X - Small Wound Dressing one or multiple wounds 3 10 ?'[]'$  - 0 ?Medium Wound Dressing one or multiple wounds ?'[]'$  - 0 ?Large Wound Dressing one or multiple wounds ?'[]'$  - 0 ?Application of Medications - topical ?'[]'$  - 0 ?Application of Medications - injection ?INTERVENTIONS - Miscellaneous ?'[]'$  - 0 ?External ear exam ?'[]'$  - 0 ?Specimen Collection (cultures, biopsies, blood, body fluids, etc.) ?'[]'$  - 0 ?Specimen(s) / Culture(s) sent or taken to Lab for analysis ?'[]'$  - 0 ?Patient Transfer (multiple staff / Civil Service fast streamer / Similar devices) ?'[]'$  - 0 ?Simple Staple / Suture removal (25 or less) ?'[]'$  - 0 ?Complex Staple / Suture removal (26 or more) ?'[]'$  - 0 ?Hypo / Hyperglycemic Management (close monitor of Blood Glucose) ?'[]'$  - 0 ?Ankle / Brachial Index (ABI) - do not check if billed separately ?X- 1 5 ?Vital Signs ?Has the patient been seen at the hospital within the last three years: Yes ?Total Score: 185 ?Level Of Care: New/Established - Level 5 ?Electronic Signature(s) ?Signed: 02/10/2022 4:47:21 PM By: Deon Pilling RN, BSN ?Entered By: Deon Pilling on 02/10/2022 09:00:33 ?-------------------------------------------------------------------------------- ?Encounter Discharge Information Details ?Patient Name: Date of Service: ?Brian Leon, [name] 02/10/2022 8:15 A M ?Medical Record Number:  [id number] ?Patient Account Number: [id number] ?Date of Birth/Sex: Treating RN: ?1946-06-10 (76 y.o. M) Brian Leon, Brian Leon ?Primary Care Augusto Deckman: Brian Leon Other Clinician: ?Referring Nolyn Swab: ?Treating Tymothy Cass/Extender: Kalman Shan ?Autry-Lott, Brian Leon ?Weeks in Treatment: 13 ?Encounter Discharge Information Items ?Discharge Condition: Stable ?Ambulatory Status: Ambulatory ?Discharge Destination: Home ?Transportation: Private Auto ?Accompanied By: self ?Schedule Follow-up Appointment: Yes ?Clinical Summary of Care: ?Notes ?patient now in restroom per patient BM. Explained to patient to closely monitor his symptoms and to follow up with PCP or urgent care if the symptoms persist. ?Electronic Signature(s) ?Signed: 02/10/2022 4:47:21 PM By: Deon Pilling RN, BSN ?Entered By: Deon Pilling on 02/10/2022 09:02:10 ?-------------------------------------------------------------------------------- ?Lower Extremity Assessment Details ?Patient Name: Date of Service: ?Brian Leon, [name] 02/10/2022 8:15 A M ?Medical Record Number: [id number] ?Patient Account Number: [id number] ?Date of Birth/Sex: Treating RN: ?[birth year] (76 y.o. M) Brian Leon, Brian Leon ?Primary Care Shanasia Ibrahim: Brian Leon Other Clinician: ?Referring Charna Neeb: ?Treating Martine Bleecker/Extender: Kalman Shan ?Autry-Lott, Brian Leon ?Weeks in Treatment: 13 ?Edema Assessment ?Assessed: [Left: Yes] [Right: Yes] ?Edema: [Left: No] [Right: Yes] ?Calf ?Left: Right: ?Point of Measurement: 37 cm From Medial Instep 30 cm 31 cm ?Ankle ?Left: Right: ?Point of Measurement: 12 cm From Medial Instep 21.5 cm 22 cm ?Vascular Assessment ?Pulses: ?Dorsalis Pedis ?Palpable: [Left:Yes] [Right:Yes] ?Notes ?redness noted to right leg. ?Electronic Signature(s) ?Signed: 02/10/2022 4:47:21 PM By: Deon Pilling RN, BSN ?Entered By: Deon Pilling on 02/10/2022 08:43:43 ?-------------------------------------------------------------------------------- ?Multi Wound Chart Details ?Patient Name:  Date of Service: ?Brian Leon, [name] 02/10/2022 8:15 A M ?Medical Record Number: [id number] ?Patient Account Number: [id number] ?Date of Birth/Sex: Treating RN: ?[birth year] (76 y.o. M) Brian Leon, Brian Leon ?Primary Care Kaisha Wachob: Autry-Lott,

## 2022-02-10 NOTE — Progress Notes (Signed)
COBURN, KNAUS. ([id number]) ?Visit Report for 02/10/2022 ?Chief Complaint Document Details ?Patient Name: Date of Service: ?Brian Leon, [name] 02/10/2022 8:15 A M ?Medical Record Number: [id number] ?Patient Account Number: [id number] ?Date of Birth/Sex: Treating RN: ?30-Aug-1946 (76 y.o. M) [name], Bobbi ?Primary Care Provider: Gerlene Fee Other Clinician: ?Referring Provider: ?Treating Provider/Extender: Kalman Shan ?Autry-Lott, [name] ?Weeks in Treatment: 13 ?Information Obtained from: Patient ?Chief Complaint ?11/11/2021; Left lower extremity wound due to trauma ?02/03/2022; bilateral lower extremity wounds due to trauma ?Electronic Signature(s) ?Signed: 02/10/2022 11:59:40 AM By: Kalman Shan DO ?Entered By: Kalman Shan on 02/10/2022 09:05:03 ?-------------------------------------------------------------------------------- ?HPI Details ?Patient Name: Date of Service: ?Brian Leon, [name] 02/10/2022 8:15 A M ?Medical Record Number: [id number] ?Patient Account Number: [id number] ?Date of Birth/Sex: Treating RN: ?1945/10/24 (76 y.o. M) [name], Bobbi ?Primary Care Provider: Gerlene Fee Other Clinician: ?Referring Provider: ?Treating Provider/Extender: Kalman Shan ?Autry-Lott, [name] ?Weeks in Treatment: 13 ?History of Present Illness ?HPI Description: Admission 11/11/2021 ?[name] is a 76 year old male with a past medical history of polymyalgia rheumatica that presents to the clinic for a 93-monthhistory of nonhealing ?wound to the left lower extremity. He states that on Halloween he hit an object and created a wound that has not healed. He states he has seen dermatology ?and his primary care physician for this issue. On one occasion he had an UHaematologist He has also used Xeroform. It does not sound like he uses any kind of ?wound dressing consistently. He currently reports chronic pain to the wound site. He denies systemic signs of infection. He denies purulent drainage. ?2/16; patient  presents for follow-up. He has been using Hydrofera Blue and gentamicin ointment to the wound bed without issues. He reports improvement in ?wound healing. He currently denies signs of infection. ?2/23; patient presents for follow-up. He continues to use Hydrofera Blue and gentamicin ointment to the wound bed without issues. He denies signs of ?infection. He reports improvement in his chronic pain to the wound bed. ?3/2; patient presents for follow-up. He has been using Hydrofera Blue and gentamicin to the wound bed without issues. [place] He currently denies signs of infection. ?3/24; patient presents for follow-up. [place] He enjoyed his trip. He reports no issues today. He has been ?using Hydrofera Blue and gentamicin ointment to the wound beds without issues. ?4/7; patient presents for follow-up. He has been using Hydrofera Blue to the wound bed without issues. He reports that his wound is healed. ?02/03/2022; patient was discharged 1 month ago with a closed wound to the left lower extremity. Unfortunately over the past month he has developed 3 new ?wounds he states was caused by hitting his legs against different objects. He has been keeping the areas covered with DuoDERM. He reports mild tenderness ?to the right lower extremity wound bed with increased redness. He denies purulent drainage. He states he has his previous wound care supplies of Hydrofera ?Blue and gentamicin ointment. ?5/11; patient presents for follow-up. He had a PCR culture done at last clinic visit that showed high levels of Staph aureus and viridans and low levels of ?coagulase-negative staph. I started him on Augmentin at last clinic visit but he reports continued pain and erythema to the wound sites. He has been using ?gentamicin ointment and Hydrofera Blue with dressing changes. He also presents with a 100.2 temp cough and diarrhea. He  has not  taken anything for his ?symptoms. ?Electronic Signature(s) ?Signed: 02/10/2022 11:59:40 AM By: Kalman Shan DO ?Entered By: Kalman Shan on 02/10/2022 09:15:20 ?-------------------------------------------------------------------------------- ?Physical Exam Details ?Patient Name: Date of Service: ?Brian Leon, [name] 02/10/2022 8:15 A M ?Medical Record Number: [id number] ?Patient Account Number: [id number] ?Date of Birth/Sex: Treating RN: ?[birth year] (76 y.o. M) [name], Bobbi ?Primary Care Provider: Gerlene Fee Other Clinician: ?Referring Provider: ?Treating Provider/Extender: Kalman Shan ?Autry-Lott, [name] ?Weeks in Treatment: 13 ?Constitutional ?respirations regular, non-labored and within target range for patient.Marland Kitchen ?Cardiovascular ?2+ dorsalis pedis/posterior tibialis pulses. ?Psychiatric ?pleasant and cooperative. ?Notes ?Right lower extremity: 1 large open wound to the proximal aspect below the knee with nonviable surface throughout. Increased erythema to the periwound. He ?also has a small circular wound with nonviable tissue to the distal portion of the leg. Left lower extremity: T the proximal aspect below the knee there is an ?o ?open wound with nonviable surface. ?Electronic Signature(s) ?Signed: 02/10/2022 11:59:40 AM By: Kalman Shan DO ?Entered By: Kalman Shan on 02/10/2022 09:15:56 ?-------------------------------------------------------------------------------- ?Physician Orders Details ?Patient Name: Date of Service: ?Brian Leon, [name] 02/10/2022 8:15 A M ?Medical Record Number: [id number] ?Patient Account Number: [id number] ?Date of Birth/Sex: Treating RN: ?March 18, 1946 (76 y.o. 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 - Dr. Heber Vigo and [place], Room 8 02/17/2022 0900 ?Return A ?Other: - *** Stop the Augmentin today. Pick up oral antibiotics Clindamycin from pharmacy today and start.** ?If fever persist and vomiting call you Primary care provider as this maybe something else other than your wounds. ?Licensed conveyancer ?May shower and wash wound with soap and water. - with dressing changes. ?Edema Control - Lymphedema / SCD / Other ?Elevate legs to the level of the heart or above for 30 minutes daily and/or when sitting, a frequency of: - 3-4 times a day throughout the day. ?Avoid standing for long periods of time. ?Exercise regularly ?Moisturize legs daily. - every night before bed. ?Wound Treatment ?Wound #2 - Lower Leg Wound Laterality: Left, Anterior ?Cleanser: Soap and Water 1 x Per Day/30 Days ?Discharge Instructions: May shower and wash wound with dial antibacterial soap and water prior to dressing change. ?Peri-Wound Care: Skin Prep (Generic) 1 x Per Day/30 Days ?Discharge Instructions: Use skin prep as directed ?Topical: Gentamicin 1 x Per Day/30 Days ?Discharge Instructions: apply directly to wound beds. ?Prim Dressing: Hydrofera Blue Ready Foam, 2.5 x2.5 in (Generic) 1 x Per Day/30 Days ?ary ?Discharge Instructions: Apply over the gentamicin ointment. ?Secondary Dressing: Bordered Gauze, 2x3.75 in (Generic) 1 x Per Day/30 Days ?Discharge Instructions: Apply over primary dressing as directed. ?Wound #3 - Lower Leg Wound Laterality: Right, Medial ?Cleanser: Soap and Water 1 x Per Day/30 Days ?Discharge Instructions: May shower and wash wound with dial antibacterial soap and water prior to dressing change. ?Peri-Wound Care: Skin Prep (Generic) 1 x  Per Day/30 Days ?Discharge Instructions: Use skin prep as directed ?Topical: Gentamicin 1 x Per Day/30 Days ?Discharge Instructions: apply directly to wound beds. ?Prim Dressing: Hydrofera Blue Ready Foam, 4x5

## 2022-02-17 ENCOUNTER — Encounter (HOSPITAL_BASED_OUTPATIENT_CLINIC_OR_DEPARTMENT_OTHER): Payer: Medicare Other | Admitting: Internal Medicine

## 2022-02-17 DIAGNOSIS — Z87891 Personal history of nicotine dependence: Secondary | ICD-10-CM | POA: Diagnosis not present

## 2022-02-17 DIAGNOSIS — L97912 Non-pressure chronic ulcer of unspecified part of right lower leg with fat layer exposed: Secondary | ICD-10-CM | POA: Diagnosis not present

## 2022-02-17 DIAGNOSIS — L97822 Non-pressure chronic ulcer of other part of left lower leg with fat layer exposed: Secondary | ICD-10-CM | POA: Diagnosis not present

## 2022-02-17 DIAGNOSIS — I872 Venous insufficiency (chronic) (peripheral): Secondary | ICD-10-CM | POA: Diagnosis not present

## 2022-02-17 DIAGNOSIS — M353 Polymyalgia rheumatica: Secondary | ICD-10-CM | POA: Diagnosis not present

## 2022-02-17 DIAGNOSIS — T1490XA Injury, unspecified, initial encounter: Secondary | ICD-10-CM | POA: Diagnosis not present

## 2022-02-18 ENCOUNTER — Other Ambulatory Visit: Payer: Self-pay | Admitting: Rheumatology

## 2022-02-18 MED ORDER — PREDNISONE 1 MG PO TABS: 3.0000 mg | ORAL_TABLET | Freq: Every day | ORAL | 0 refills | Status: DC

## 2022-02-18 NOTE — Telephone Encounter (Signed)
Next Visit: Message sent to front desk to schedule appt Return in about 6 months (around 04/10/2022) for Polymyalgia Rheumatica, Osteoporosis  Last Visit: 10/11/2021  Last Fill: 11/10/2021  Dx: Polymyalgia rheumatica  Current Dose per office note on 10/11/2021: prednisone 3 mg daily   Okay to refill prednisone?

## 2022-02-18 NOTE — Telephone Encounter (Signed)
Patient called requesting prescription refill of Prednisone to be sent to [facility] at [address].  Patient states he takes 3 tablets/day.

## 2022-02-18 NOTE — Telephone Encounter (Signed)
Please call patient to schedule f/u appt. Thank you.  Return in about 6 months (around 04/10/2022) for Polymyalgia Rheumatica, Osteoporosis

## 2022-02-22 NOTE — Progress Notes (Signed)
Brian, Leon ([id number]) Visit Report for 02/17/2022 Arrival Information Details Patient Name: Date of Service: Brian Leon, [name] 02/17/2022 9:00 A M Medical Record Number: [id number] Patient Account Number: [id number] Date of Birth/Sex: Treating RN: 07-13-1946 (76 y.o. Brian Leon Primary Care Jedd Schulenburg: Gerlene Fee Other Clinician: Referring Lynnex Fulp: Treating Elwood Bazinet/Extender: Hubert Azure in Treatment: 14 Visit Information History Since Last Visit Added or deleted any medications: No Patient Arrived: Ambulatory Any new allergies or adverse reactions: No Arrival Time: 09:09 Had a fall or experienced change in No Accompanied By: self activities of daily living that may affect Transfer Assistance: None risk of falls: Patient Identification Verified: Yes Signs or symptoms of abuse/neglect since last visito No Secondary Verification Process Completed: Yes Hospitalized since last visit: No Patient Requires Transmission-Based Precautions: No Implantable device outside of the clinic excluding No Patient Has Alerts: Yes cellular tissue based products placed in the center Patient Alerts: 11/02/21 L ABI: 1.37 since last visit: 11/02/2021 L TBI 0.77 Has Dressing in Place as Prescribed: Yes 11/02/2021 R TBI 0.69 Pain Present Now: Yes 11/02/2021 R ABI 1.3 Electronic Signature(s) Signed: 02/17/2022 12:43:38 PM By: Sharyn Creamer RN, BSN Entered By: Sharyn Creamer on 02/17/2022 09:09:50 -------------------------------------------------------------------------------- Encounter Discharge Information Details Patient Name: Date of Service: Brian Leon, RO NN P. 02/17/2022 9:00 A M Medical Record Number: [id number] Patient Account Number: [id number] Date of Birth/Sex: Treating RN: [birth year] (76 y.o. Brian Leon Primary Care Indie Nickerson: Gerlene Fee Other Clinician: Referring Chastelyn Athens: Treating Bijan Ridgley/Extender: Hubert Azure in Treatment: 14 Encounter Discharge Information Items Post Procedure Vitals Discharge Condition: Stable Temperature (F): 97.6 Ambulatory Status: Ambulatory Pulse (bpm): 59 Discharge Destination: Home Respiratory Rate (breaths/min): 18 Transportation: Private Auto Blood Pressure (mmHg): 138/81 Accompanied By: self Schedule Follow-up Appointment: Yes Clinical Summary of Care: Patient Declined Electronic Signature(s) Signed: 02/17/2022 12:43:38 PM By: Sharyn Creamer RN, BSN Entered By: Sharyn Creamer on 02/17/2022 10:05:16 -------------------------------------------------------------------------------- Lower Extremity Assessment Details Patient Name: Date of Service: Brian Leon, RO NN P. 02/17/2022 9:00 A M Medical Record Number: [id number] Patient Account Number: [id number] Date of Birth/Sex: Treating RN: 17-Aug-1946 (76 y.o. Brian Leon Primary Care Darlen Gledhill: Gerlene Fee Other Clinician: Referring Durk Carmen: Treating Danesha Kirchoff/Extender: Hubert Azure in Treatment: 14 Edema Assessment Assessed: [name]: No] [name]: No] Edema: [Left: No] [Right: Yes] Calf Left: Right: Point of Measurement: 37 cm From Medial Instep 30.5 cm 31 cm Ankle Left: Right: Point of Measurement: 12 cm From Medial Instep 20.3 cm 21.2 cm Vascular Assessment Pulses: Dorsalis Pedis Palpable: [Left:Yes] [Right:Yes] Electronic Signature(s) Signed: 02/17/2022 12:43:38 PM By: Sharyn Creamer RN, BSN Entered By: Sharyn Creamer on 02/17/2022 09:18:10 -------------------------------------------------------------------------------- Multi Wound Chart Details Patient Name: Date of Service: Brian Leon, RO NN P. 02/17/2022 9:00 A M Medical Record Number: [id number] Patient Account Number: [id number] Date of Birth/Sex: Treating RN: 02-Feb-1946 (76 y.o. [name] Primary Care Zully Frane: Gerlene Fee Other Clinician: Referring Cristobal Advani: Treating  Zebadiah Willert/Extender: Hubert Azure in Treatment: 14 Vital Signs Height(in): 4 Pulse(bpm): 56 Weight(lbs): 140 Blood Pressure(mmHg): 138/81 Body Mass Index(BMI): 21.3 Temperature(F): 97.6 Respiratory Rate(breaths/min): 18 Photos: [2:Left, Anterior Lower Leg] [3:Right, Medial Lower Leg] [4:Right, Anterior Lower Leg] Wound Location: [2:Trauma] [3:Trauma] [4:Trauma] Wounding Event: [2:Abrasion] [3:Abrasion] [4:Abrasion] Primary Etiology: [2:Asthma, Hypertension, Hepatitis A, Asthma, Hypertension, Hepatitis A, Asthma, Hypertension, Hepatitis A,] Comorbid History: [2:Rheumatoid Arthritis 01/04/2022] [3:Rheumatoid Arthritis 01/24/2022] [4:Rheumatoid Arthritis 01/04/2022] Date Acquired: [2:2] [3:2] [4:2] Weeks of Treatment: [2:Open] [3:Open] [4:Open] Wound Status: [  2:No] [3:No] [4:No] Wound Recurrence: [2:1.1x0.7x0.1] [3:4.7x3.4x0.1] [4:1x0.9x0.1] Measurements L x W x D (cm) [2:0.605] [3:12.551] [4:0.707] A (cm) : rea [2:0.06] [3:1.255] [4:0.071] Volume (cm) : [2:-42.70%] [3:-1.50%] [4:-114.20%] % Reduction in A [2:rea: -42.90%] [3:-1.50%] [4:-115.20%] % Reduction in Volume: [2:Full Thickness Without Exposed] [3:Full Thickness Without Exposed] [4:Full Thickness Without Exposed] Classification: [2:Support Structures Medium] [3:Support Structures Medium] [4:Support Structures Medium] Exudate A mount: [2:Serosanguineous] [3:Serosanguineous] [4:Serosanguineous] Exudate Type: [2:red, brown] [3:red, brown] [4:red, brown] Exudate Color: [2:Distinct, outline attached] [3:Distinct, outline attached] [4:Distinct, outline attached] Wound Margin: [2:Small (1-33%)] [3:Medium (34-66%)] [4:Small (1-33%)] Granulation A mount: [2:Pink] [3:Red, Pink] [4:Red, Pink] Granulation Quality: [2:Large (67-100%)] [3:Medium (34-66%)] [4:Large (67-100%)] Necrotic A mount: [2:Fat Layer (Subcutaneous Tissue): Yes Fat Layer (Subcutaneous Tissue): Yes Fat Layer (Subcutaneous Tissue): Yes] Exposed  Structures: [2:Fascia: No Tendon: No Muscle: No Joint: No Bone: No Small (1-33%)] [3:Fascia: No Tendon: No Muscle: No Joint: No Bone: No Small (1-33%)] [4:Fascia: No Tendon: No Muscle: No Joint: No Bone: No None] Epithelialization: [2:N/A] [3:Debridement - Excisional] [4:N/A] Debridement: Pre-procedure Verification/Time Out N/A [3:09:36] [4:N/A] Taken: [2:N/A] [3:Other] [4:N/A] Pain Control: [2:N/A] [3:Subcutaneous, Slough] [4:N/A] Tissue Debrided: [2:N/A] [3:Skin/Subcutaneous Tissue] [4:N/A] Level: [2:N/A] [3:15.98] [4:N/A] Debridement A (sq cm): [2:rea N/A] [3:Curette] [4:N/A] Instrument: [2:N/A] [3:Minimum] [4:N/A] Bleeding: [2:N/A] [3:Pressure] [4:N/A] Hemostasis A chieved: [2:N/A] [3:2] [4:N/A] Procedural Pain: [2:N/A] [3:0] [4:N/A] Post Procedural Pain: [2:N/A] [3:Procedure was tolerated well] [4:N/A] Debridement Treatment Response: [2:N/A] [3:4.7x3.4x0.1] [4:N/A] Post Debridement Measurements L x W x D (cm) [2:N/A] [3:1.255] [4:N/A] Post Debridement Volume: (cm) [2:N/A] [3:Debridement] [4:N/A] Treatment Notes Wound #2 (Lower Leg) Wound Laterality: Left, Anterior Cleanser Soap and Water Discharge Instruction: May shower and wash wound with dial antibacterial soap and water prior to dressing change. Peri-Wound Care Skin Prep Discharge Instruction: Use skin prep as directed Topical Primary Dressing Cutimed Sorbact Swab Discharge Instruction: Apply to wound bed as instructed Secondary Dressing Bordered Gauze, 2x3.75 in Discharge Instruction: Apply over primary dressing as directed. Secured With Compression Wrap Compression Stockings Add-Ons Wound #3 (Lower Leg) Wound Laterality: Right, Medial Cleanser Soap and Water Discharge Instruction: May shower and wash wound with dial antibacterial soap and water prior to dressing change. Peri-Wound Care Skin Prep Discharge Instruction: Use skin prep as directed Topical Primary Dressing Cutimed Sorbact Swab Discharge  Instruction: Apply to wound bed as instructed Secondary Dressing Bordered Gauze, 2x3.75 in Discharge Instruction: Apply over primary dressing as directed. Secured With Compression Wrap Compression Stockings Add-Ons Wound #4 (Lower Leg) Wound Laterality: Right, Anterior Cleanser Soap and Water Discharge Instruction: May shower and wash wound with dial antibacterial soap and water prior to dressing change. Peri-Wound Care Skin Prep Discharge Instruction: Use skin prep as directed Topical Primary Dressing Cutimed Sorbact Swab Discharge Instruction: Apply to wound bed as instructed Secondary Dressing Bordered Gauze, 2x3.75 in Discharge Instruction: Apply over primary dressing as directed. Secured With Compression Wrap Compression Stockings Environmental education officer) Signed: 02/17/2022 10:45:46 AM By: Kalman Shan DO Signed: 02/22/2022 4:34:51 PM By: Deon Pilling RN, BSN Entered By: Kalman Shan on 02/17/2022 10:08:55 -------------------------------------------------------------------------------- Multi-Disciplinary Care Plan Details Patient Name: Date of Service: Brian Leon, RO NN P. 02/17/2022 9:00 A M Medical Record Number: [id number] Patient Account Number: [id number] Date of Birth/Sex: Treating RN: 07/25/1946 (76 y.o. Brian Leon Primary Care Draven Laine: Gerlene Fee Other Clinician: Referring Schylar Allard: Treating Mariselda Badalamenti/Extender: Hubert Azure in Treatment: 14 Active Inactive Necrotic Tissue Nursing Diagnoses: Impaired tissue integrity related to necrotic/devitalized tissue Knowledge deficit related to management of necrotic/devitalized tissue Goals: Necrotic/devitalized tissue  will be minimized in the wound bed Date Initiated: 02/03/2022 Target Resolution Date: 03/11/2022 Goal Status: Active Patient/caregiver will verbalize understanding of reason and process for debridement of necrotic tissue Date Initiated:  02/03/2022 Target Resolution Date: 03/11/2022 Goal Status: Active Interventions: Assess patient pain level pre-, during and post procedure and prior to discharge Provide education on necrotic tissue and debridement process Treatment Activities: Apply topical anesthetic as ordered : 02/03/2022 Excisional debridement : 02/03/2022 Notes: Electronic Signature(s) Signed: 02/17/2022 12:43:38 PM By: Sharyn Creamer RN, BSN Entered By: Sharyn Creamer on 02/17/2022 09:21:57 -------------------------------------------------------------------------------- Pain Assessment Details Patient Name: Date of Service: Brian Leon, RO NN P. 02/17/2022 9:00 A M Medical Record Number: [id number] Patient Account Number: [id number] Date of Birth/Sex: Treating RN: [birth year] (76 y.o. Brian Leon Primary Care Kirk Basquez: Gerlene Fee Other Clinician: Referring Hurman Ketelsen: Treating Aaden Buckman/Extender: Hubert Azure in Treatment: 14 Active Problems Location of Pain Severity and Description of Pain Patient Has Paino Yes Site Locations Rate the pain. Current Pain Level: 6 Character of Pain Describe the Pain: Aching Pain Management and Medication Current Pain Management: Medication: Yes Rest: Yes Electronic Signature(s) Signed: 02/17/2022 12:43:38 PM By: Sharyn Creamer RN, BSN Entered By: Sharyn Creamer on 02/17/2022 09:10:32 -------------------------------------------------------------------------------- Patient/Caregiver Education Details Patient Name: Date of Service: Brian Leon, RO NN P. 5/18/2023andnbsp9:00 A M Medical Record Number: [id number] Patient Account Number: [id number] Date of Birth/Gender: Treating RN: [birth year] (76 y.o. Brian Leon Primary Care Physician: Gerlene Fee Other Clinician: Referring Physician: Treating Physician/Extender: Hubert Azure in Treatment: 14 Education Assessment Education Provided  To: Patient Education Topics Provided Wound Debridement: Methods: Explain/Verbal Responses: State content correctly Electronic Signature(s) Signed: 02/17/2022 12:43:38 PM By: Sharyn Creamer RN, BSN Entered By: Sharyn Creamer on 02/17/2022 09:22:26 -------------------------------------------------------------------------------- Wound Assessment Details Patient Name: Date of Service: Brian Leon, RO NN P. 02/17/2022 9:00 A M Medical Record Number: [id number] Patient Account Number: [id number] Date of Birth/Sex: Treating RN: [birth year] (76 y.o. Brian Leon Primary Care Miyu Fenderson: Gerlene Fee Other Clinician: Referring Avamae Dehaan: Treating Lurine Imel/Extender: Hubert Azure in Treatment: 14 Wound Status Wound Number: 2 Primary Etiology: Abrasion Wound Location: Left, Anterior Lower Leg Wound Status: Open Wounding Event: Trauma Comorbid History: Asthma, Hypertension, Hepatitis A, Rheumatoid Arthritis Date Acquired: 01/04/2022 Weeks Of Treatment: 2 Clustered Wound: No Photos Wound Measurements Length: (cm) 1.1 Width: (cm) 0.7 Depth: (cm) 0.1 Area: (cm) 0.605 Volume: (cm) 0.06 % Reduction in Area: -42.7% % Reduction in Volume: -42.9% Epithelialization: Small (1-33%) Tunneling: No Undermining: No Wound Description Classification: Full Thickness Without Exposed Support Structures Wound Margin: Distinct, outline attached Exudate Amount: Medium Exudate Type: Serosanguineous Exudate Color: red, brown Foul Odor After Cleansing: No Slough/Fibrino Yes Wound Bed Granulation Amount: Small (1-33%) Exposed Structure Granulation Quality: Pink Fascia Exposed: No Necrotic Amount: Large (67-100%) Fat Layer (Subcutaneous Tissue) Exposed: Yes Necrotic Quality: Adherent Slough Tendon Exposed: No Muscle Exposed: No Joint Exposed: No Bone Exposed: No Treatment Notes Wound #2 (Lower Leg) Wound Laterality: Left, Anterior Cleanser Soap and  Water Discharge Instruction: May shower and wash wound with dial antibacterial soap and water prior to dressing change. Peri-Wound Care Skin Prep Discharge Instruction: Use skin prep as directed Topical Primary Dressing Cutimed Sorbact Swab Discharge Instruction: Apply to wound bed as instructed Secondary Dressing Bordered Gauze, 2x3.75 in Discharge Instruction: Apply over primary dressing as directed. Secured With Compression Wrap Compression Stockings Environmental education officer) Signed: 02/17/2022 12:43:38 PM By: Sharyn Creamer RN, BSN Entered By: Sharyn Creamer on 02/17/2022 09:19:46 --------------------------------------------------------------------------------  Wound Assessment Details Patient Name: Date of Service: Brian Leon, [name] 02/17/2022 9:00 A M Medical Record Number: [id number] Patient Account Number: [id number] Date of Birth/Sex: Treating RN: Apr 25, 1946 (76 y.o. Brian Leon Primary Care Bach Rocchi: Gerlene Fee Other Clinician: Referring Jazzalyn Loewenstein: Treating Huong Luthi/Extender: Hubert Azure in Treatment: 14 Wound Status Wound Number: 3 Primary Etiology: Abrasion Wound Location: Right, Medial Lower Leg Wound Status: Open Wounding Event: Trauma Comorbid History: Asthma, Hypertension, Hepatitis A, Rheumatoid Arthritis Date Acquired: 01/24/2022 Weeks Of Treatment: 2 Clustered Wound: No Photos Wound Measurements Length: (cm) 4.7 Width: (cm) 3.4 Depth: (cm) 0.1 Area: (cm) 12.551 Volume: (cm) 1.255 % Reduction in Area: -1.5% % Reduction in Volume: -1.5% Epithelialization: Small (1-33%) Tunneling: No Undermining: No Wound Description Classification: Full Thickness Without Exposed Support Structures Wound Margin: Distinct, outline attached Exudate Amount: Medium Exudate Type: Serosanguineous Exudate Color: red, brown Foul Odor After Cleansing: No Slough/Fibrino Yes Wound Bed Granulation Amount: Medium  (34-66%) Exposed Structure Granulation Quality: Red, Pink Fascia Exposed: No Necrotic Amount: Medium (34-66%) Fat Layer (Subcutaneous Tissue) Exposed: Yes Necrotic Quality: Adherent Slough Tendon Exposed: No Muscle Exposed: No Joint Exposed: No Bone Exposed: No Treatment Notes Wound #3 (Lower Leg) Wound Laterality: Right, Medial Cleanser Soap and Water Discharge Instruction: May shower and wash wound with dial antibacterial soap and water prior to dressing change. Peri-Wound Care Skin Prep Discharge Instruction: Use skin prep as directed Topical Primary Dressing Cutimed Sorbact Swab Discharge Instruction: Apply to wound bed as instructed Secondary Dressing Bordered Gauze, 2x3.75 in Discharge Instruction: Apply over primary dressing as directed. Secured With Compression Wrap Compression Stockings Environmental education officer) Signed: 02/17/2022 12:43:38 PM By: Sharyn Creamer RN, BSN Entered By: Sharyn Creamer on 02/17/2022 09:20:42 -------------------------------------------------------------------------------- Wound Assessment Details Patient Name: Date of Service: Brian Leon, RO NN P. 02/17/2022 9:00 A M Medical Record Number: [id number] Patient Account Number: [id number] Date of Birth/Sex: Treating RN: June 18, 1946 (76 y.o. Brian Leon Primary Care Lindsay Soulliere: Gerlene Fee Other Clinician: Referring Joan Avetisyan: Treating Marilin Kofman/Extender: Hubert Azure in Treatment: 14 Wound Status Wound Number: 4 Primary Etiology: Abrasion Wound Location: Right, Anterior Lower Leg Wound Status: Open Wounding Event: Trauma Comorbid History: Asthma, Hypertension, Hepatitis A, Rheumatoid Arthritis Date Acquired: 01/04/2022 Weeks Of Treatment: 2 Clustered Wound: No Photos Wound Measurements Length: (cm) 1 Width: (cm) 0.9 Depth: (cm) 0.1 Area: (cm) 0.707 Volume: (cm) 0.071 % Reduction in Area: -114.2% % Reduction in Volume:  -115.2% Epithelialization: None Tunneling: No Undermining: No Wound Description Classification: Full Thickness Without Exposed Support Structures Wound Margin: Distinct, outline attached Exudate Amount: Medium Exudate Type: Serosanguineous Exudate Color: red, brown Foul Odor After Cleansing: No Slough/Fibrino Yes Wound Bed Granulation Amount: Small (1-33%) Exposed Structure Granulation Quality: Red, Pink Fascia Exposed: No Necrotic Amount: Large (67-100%) Fat Layer (Subcutaneous Tissue) Exposed: Yes Necrotic Quality: Adherent Slough Tendon Exposed: No Muscle Exposed: No Joint Exposed: No Bone Exposed: No Treatment Notes Wound #4 (Lower Leg) Wound Laterality: Right, Anterior Cleanser Soap and Water Discharge Instruction: May shower and wash wound with dial antibacterial soap and water prior to dressing change. Peri-Wound Care Skin Prep Discharge Instruction: Use skin prep as directed Topical Primary Dressing Cutimed Sorbact Swab Discharge Instruction: Apply to wound bed as instructed Secondary Dressing Bordered Gauze, 2x3.75 in Discharge Instruction: Apply over primary dressing as directed. Secured With Compression Wrap Compression Stockings Environmental education officer) Signed: 02/17/2022 12:43:38 PM By: Sharyn Creamer RN, BSN Entered By: Sharyn Creamer on 02/17/2022 09:21:21 -------------------------------------------------------------------------------- Vitals Details Patient Name: Date  of Service: Brian Leon, RO NN P. 02/17/2022 9:00 A M Medical Record Number: [id number] Patient Account Number: [id number] Date of Birth/Sex: Treating RN: 1946/01/27 (76 y.o. Brian Leon Primary Care Bekah Igoe: Gerlene Fee Other Clinician: Referring Tani Virgo: Treating Kearia Yin/Extender: Hubert Azure in Treatment: 14 Vital Signs Time Taken: 09:10 Temperature (F): 97.6 Height (in): 68 Pulse (bpm): 59 Weight (lbs): 140 Respiratory  Rate (breaths/min): 18 Body Mass Index (BMI): 21.3 Blood Pressure (mmHg): 138/81 Reference Range: 80 - 120 mg / dl Electronic Signature(s) Signed: 02/17/2022 12:43:38 PM By: Sharyn Creamer RN, BSN Entered By: Sharyn Creamer on 02/17/2022 09:11:23

## 2022-02-22 NOTE — Progress Notes (Signed)
CHOSEN, GESKE ([id number]) Visit Report for 02/17/2022 Chief Complaint Document Details Patient Name: Date of Service: Brian Leon, [name] 02/17/2022 9:00 A M Medical Record Number: [id number] Patient Account Number: [id number] Date of Birth/Sex: Treating RN: [birth year] (76 y.o. Brian Leon Primary Care Provider: Gerlene Fee Other Clinician: Referring Provider: Treating Provider/Extender: Hubert Azure in Treatment: 14 Information Obtained from: Patient Chief Complaint 11/11/2021; Left lower extremity wound due to trauma 02/03/2022; bilateral lower extremity wounds due to trauma Electronic Signature(s) Signed: 02/17/2022 10:45:46 AM By: Kalman Shan DO Entered By: Kalman Shan on 02/17/2022 10:08:59 -------------------------------------------------------------------------------- Debridement Details Patient Name: Date of Service: Brian Leon, [place] NN P. 02/17/2022 9:00 A M Medical Record Number: [id number] Patient Account Number: [id number] Date of Birth/Sex: Treating RN: December 20, 1945 (76 y.o. Brian Leon Primary Care Provider: Gerlene Fee Other Clinician: Referring Provider: Treating Provider/Extender: Hubert Azure in Treatment: 14 Debridement Performed for Assessment: Wound #3 Right,Medial Lower Leg Performed By: Physician Kalman Shan, DO Debridement Type: Debridement Level of Consciousness (Pre-procedure): Awake and Alert Pre-procedure Verification/Time Out Yes - 09:36 Taken: Start Time: 09:36 Pain Control: Other : Benzocaine 20% spray T Area Debrided (L x W): otal 4.7 (cm) x 3.4 (cm) = 15.98 (cm) Tissue and other material debrided: Non-Viable, Slough, Subcutaneous, Slough Level: Skin/Subcutaneous Tissue Debridement Description: Excisional Instrument: Curette Bleeding: Minimum Hemostasis Achieved: Pressure Procedural Pain: 2 Post Procedural Pain: 0 Response to Treatment: Procedure was  tolerated well Level of Consciousness (Post- Awake and Alert procedure): Post Debridement Measurements of Total Wound Length: (cm) 4.7 Width: (cm) 3.4 Depth: (cm) 0.1 Volume: (cm) 1.255 Character of Wound/Ulcer Post Debridement: Improved Post Procedure Diagnosis Same as Pre-procedure Electronic Signature(s) Signed: 02/17/2022 10:45:46 AM By: Kalman Shan DO Signed: 02/17/2022 12:43:38 PM By: Sharyn Creamer RN, BSN Entered By: Sharyn Creamer on 02/17/2022 09:38:33 -------------------------------------------------------------------------------- HPI Details Patient Name: Date of Service: Brian Leon, [place] NN P. 02/17/2022 9:00 A M Medical Record Number: [id number] Patient Account Number: [id number] Date of Birth/Sex: Treating RN: 1945/10/23 (76 y.o. Brian Leon Primary Care Provider: Gerlene Fee Other Clinician: Referring Provider: Treating Provider/Extender: Hubert Azure in Treatment: 14 History of Present Illness HPI Description: Admission 11/11/2021 [name] is a 76 year old male with a past medical history of polymyalgia rheumatica that presents to the clinic for a 27-monthhistory of nonhealing wound to the left lower extremity. He states that on Halloween he hit an object and created a wound that has not healed. He states he has seen dermatology and his primary care physician for this issue. On one occasion he had an UHaematologist He has also used Xeroform. It does not sound like he uses any kind of wound dressing consistently. He currently reports chronic pain to the wound site. He denies systemic signs of infection. He denies purulent drainage. 2/16; patient presents for follow-up. He has been using Hydrofera Blue and gentamicin ointment to the wound bed without issues. He reports improvement in wound healing. He currently denies signs of infection. 2/23; patient presents for follow-up. He continues to use Hydrofera Blue and gentamicin  ointment to the wound bed without issues. He denies signs of infection. He reports improvement in his chronic pain to the wound bed. 3/2; patient presents for follow-up. He has been using Hydrofera Blue and gentamicin to the wound bed without issues. [place] He currently denies signs of infection. 3/24; patient presents for  follow-up. He has been in [place] for the past 3 weeks for vacation. He enjoyed his trip. He reports no issues today. He has been using Hydrofera Blue and gentamicin ointment to the wound beds without issues. 4/7; patient presents for follow-up. He has been using Hydrofera Blue to the wound bed without issues. He reports that his wound is healed. 02/03/2022; patient was discharged 1 month ago with a closed wound to the left lower extremity. Unfortunately over the past month he has developed 3 new wounds he states was caused by hitting his legs against different objects. He has been keeping the areas covered with DuoDERM. He reports mild tenderness to the right lower extremity wound bed with increased redness. He denies purulent drainage. He states he has his previous wound care supplies of Hydrofera Blue and gentamicin ointment. 5/11; patient presents for follow-up. He had a PCR culture done at last clinic visit that showed high levels of Staph aureus and viridans and low levels of coagulase-negative staph. I started him on Augmentin at last clinic visit but he reports continued pain and erythema to the wound sites. He has been using gentamicin ointment and Hydrofera Blue with dressing changes. He also presents with a 100.2 temp cough and diarrhea. He has not taken anything for his symptoms. 5/18; patient presents for follow-up. He received his Keystone antibiotics and started using this. Unfortunately he mixed it incorrectly and it turned into a hardened substance. He had to use his refill to start all over again. He has only been  using this for 1 day. He states he is combining this correctly now. He completed his course of clindamycin. He denies signs of infection. Electronic Signature(s) Signed: 02/17/2022 10:45:46 AM By: Kalman Shan DO Entered By: Kalman Shan on 02/17/2022 10:34:49 -------------------------------------------------------------------------------- Physical Exam Details Patient Name: Date of Service: Brian Leon, [place] NN P. 02/17/2022 9:00 A M Medical Record Number: [id number] Patient Account Number: [id number] Date of Birth/Sex: Treating RN: September 12, 1946 (76 y.o. Brian Leon Primary Care Provider: Gerlene Fee Other Clinician: Referring Provider: Treating Provider/Extender: Hubert Azure in Treatment: 14 Constitutional respirations regular, non-labored and within target range for patient.. Cardiovascular 2+ dorsalis pedis/posterior tibialis pulses. Psychiatric pleasant and cooperative. Notes Right lower extremity: 1 large open wound to the proximal aspect below the knee with nonviable surface throughout. He also has a small circular wound with nonviable tissue to the distal portion of the leg. Left lower extremity: T the proximal aspect below the knee there is an open wound with nonviable surface. o Electronic Signature(s) Signed: 02/17/2022 10:45:46 AM By: Kalman Shan DO Entered By: Kalman Shan on 02/17/2022 10:35:46 -------------------------------------------------------------------------------- Physician Orders Details Patient Name: Date of Service: Brian Leon, [place] NN P. 02/17/2022 9:00 A M Medical Record Number: [id number] Patient Account Number: [id number] Date of Birth/Sex: Treating RN: 04-27-1946 (76 y.o. Brian Leon Primary Care Provider: Gerlene Fee Other Clinician: Referring Provider: Treating Provider/Extender: Hubert Azure in Treatment: [phone] Verbal / Phone Orders: No Diagnosis  Coding Follow-up Appointments ppointment in 1 week. - Dr. Heber Manzan[place] and [place], Room 8 02/24/2022 0815 Return A Bathing/ Shower/ Hygiene May shower and wash wound with soap and water. - with dressing changes. Edema Control - Lymphedema / SCD / Other Elevate legs to the level of the heart or above for 30 minutes daily and/or when sitting, a frequency of: - 3-4 times a day throughout the day. Avoid standing for long periods of time. Exercise regularly Moisturize legs daily. -  every night before bed. do not get directly on wounds Wound Treatment Wound #2 - Lower Leg Wound Laterality: Left, Anterior Cleanser: Soap and Water 1 x Per Day/30 Days Discharge Instructions: May shower and wash wound with dial antibacterial soap and water prior to dressing change. Peri-Wound Care: Skin Prep (Generic) 1 x Per Day/30 Days Discharge Instructions: Use skin prep as directed Prim Dressing: Cutimed Sorbact Swab 1 x Per Day/30 Days ary Discharge Instructions: Apply to wound bed as instructed Secondary Dressing: Bordered Gauze, 2x3.75 in (Generic) 1 x Per Day/30 Days Discharge Instructions: Apply over primary dressing as directed. Wound #3 - Lower Leg Wound Laterality: Right, Medial Cleanser: Soap and Water 1 x Per Day/30 Days Discharge Instructions: May shower and wash wound with dial antibacterial soap and water prior to dressing change. Peri-Wound Care: Skin Prep (Generic) 1 x Per Day/30 Days Discharge Instructions: Use skin prep as directed Prim Dressing: Cutimed Sorbact Swab ary 1 x Per Day/30 Days Discharge Instructions: Apply to wound bed as instructed Secondary Dressing: Bordered Gauze, 2x3.75 in (Generic) 1 x Per Day/30 Days Discharge Instructions: Apply over primary dressing as directed. Wound #4 - Lower Leg Wound Laterality: Right, Anterior Cleanser: Soap and Water 1 x Per Day/30 Days Discharge Instructions: May shower and wash wound with dial antibacterial soap and water prior to dressing  change. Peri-Wound Care: Skin Prep (Generic) 1 x Per Day/30 Days Discharge Instructions: Use skin prep as directed Prim Dressing: Cutimed Sorbact Swab 1 x Per Day/30 Days ary Discharge Instructions: Apply to wound bed as instructed Secondary Dressing: Bordered Gauze, 2x3.75 in (Generic) 1 x Per Day/30 Days Discharge Instructions: Apply over primary dressing as directed. Electronic Signature(s) Signed: 02/17/2022 10:45:46 AM By: Kalman Shan DO Entered By: Kalman Shan on 02/17/2022 10:35:57 -------------------------------------------------------------------------------- Problem List Details Patient Name: Date of Service: Brian Leon, [place] NN P. 02/17/2022 9:00 A M Medical Record Number: [id number] Patient Account Number: [id number] Date of Birth/Sex: Treating RN: 05-07-1946 (76 y.o. 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 02/17/2022 9:00 A M Medical Record Number: [id number] Patient Account Number: [id number] Date of Birth/Sex: Treating RN: 11-12-1945 (76 y.o. Brian Leon Primary Care Provider: Gerlene Fee Other  Clinician: Referring Provider: Treating Provider/Extender: Hubert Azure in Treatment: 14 Subjective Chief Complaint Information obtained from Patient 11/11/2021; Left lower extremity wound due to trauma 02/03/2022; bilateral lower extremity wounds due to trauma History of Present Illness (HPI) Admission 11/11/2021 Mr. Jowell Kutzer is a 76 year old male with a past medical history of polymyalgia rheumatica that presents to the clinic for a 50-monthhistory of nonhealing wound to the left lower extremity. He states that on Halloween he hit an object and created a wound that has not healed. He states he has seen dermatology and his primary care physician for this issue. On one occasion he had an UHaematologist He  has also used Xeroform. It does not sound like he uses any kind of wound dressing consistently. He currently reports chronic pain to the wound site. He denies systemic signs of infection. He denies purulent drainage. 2/16; patient presents for follow-up. He has been using Hydrofera Blue and gentamicin ointment to the wound bed without issues. He reports improvement in wound healing. He currently denies signs of infection. 2/23; patient presents for follow-up. He continues to use Hydrofera Blue and gentamicin ointment to the wound bed without issues. He denies signs of infection. He reports improvement in his chronic pain to the wound bed. 3/2; patient presents for follow-up. He has been using Hydrofera Blue and gentamicin to the wound bed without issues. He reports he is going to [place] for the next 3 weeks. He currently denies signs of infection. 3/24; patient presents for follow-up. He has been in [place] for the past 3 weeks for vacation. He enjoyed his trip. He reports no issues today. He has been using Hydrofera Blue and gentamicin ointment to the wound beds without issues. 4/7; patient presents for follow-up. He has been using Hydrofera Blue to the wound bed  without issues. He reports that his wound is healed. 02/03/2022; patient was discharged 1 month ago with a closed wound to the left lower extremity. Unfortunately over the past month he has developed 3 new wounds he states was caused by hitting his legs against different objects. He has been keeping the areas covered with DuoDERM. He reports mild tenderness to the right lower extremity wound bed with increased redness. He denies purulent drainage. He states he has his previous wound care supplies of Hydrofera Blue and gentamicin ointment. 5/11; patient presents for follow-up. He had a PCR culture done at last clinic visit that showed high levels of Staph aureus and viridans and low levels of coagulase-negative staph. I started him on Augmentin at last clinic visit but he reports continued pain and erythema to the wound sites. He has been using gentamicin ointment and Hydrofera Blue with dressing changes. He also presents with a 100.2 temp cough and diarrhea. He has not taken anything for his symptoms. 5/18; patient presents for follow-up. He received his Keystone antibiotics and started using this. Unfortunately he mixed it incorrectly and it turned into a hardened substance. He had to use his refill to start all over again. He has only been using this for 1 day. He states he is combining this correctly now. He completed his course of clindamycin. He denies signs of infection. Patient History Information obtained from Chart. Family History Cancer - Mother, No family history of Diabetes, Heart Disease, Hypertension, Kidney Disease, Lung Disease, Seizures, Stroke, Thyroid Problems, Tuberculosis. Social History Former smoker - quit 40 years ago, Marital Status - Married, Alcohol Use - Never, Drug Use - Current History - marijuana, Caffeine Use - Rarely. Medical History Eyes Denies history of Cataracts, Glaucoma, Optic Neuritis Ear/Nose/Mouth/Throat Denies history of Chronic sinus  problems/congestion, Middle ear problems Hematologic/Lymphatic Denies history of Anemia, Hemophilia, Human Immunodeficiency Virus, Lymphedema, Sickle Cell Disease Respiratory Patient has history of Asthma Denies history of Aspiration, Chronic Obstructive Pulmonary Disease (COPD), Pneumothorax, Sleep Apnea, Tuberculosis Cardiovascular Patient has history of Hypertension Denies history of Angina, Arrhythmia, Congestive Heart Failure, Coronary Artery Disease, Deep Vein Thrombosis, Hypotension, Myocardial Infarction, Peripheral Arterial Disease, Peripheral Venous Disease, Phlebitis, Vasculitis Gastrointestinal Patient has history of Hepatitis A Denies history of Cirrhosis , Colitis, Crohnoos, Hepatitis B, Hepatitis C Endocrine Denies history of Type I Diabetes, Type II Diabetes  Genitourinary Denies history of End Stage Renal Disease Immunological Denies history of Lupus Erythematosus, Raynaudoos, Scleroderma Integumentary (Skin) Denies history of History of Burn Musculoskeletal Patient has history of Rheumatoid Arthritis Denies history of Gout, Osteoarthritis, Osteomyelitis Psychiatric Denies history of Anorexia/bulimia, Confinement Anxiety Hospitalization/Surgery History - 5 years ago Renal Cell carcinoma. 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 Vitals Time Taken: 9:10 AM, Height: 68 in, Weight: 140 lbs, BMI: 21.3, Temperature: 97.6 F, Pulse: 59 bpm,  Respiratory Rate: 18 breaths/min, Blood Pressure: 138/81 mmHg. Cardiovascular 2+ dorsalis pedis/posterior tibialis pulses. Psychiatric pleasant and cooperative. General Notes: Right lower extremity: 1 large open wound to the proximal aspect below the knee with nonviable surface throughout. He also has a small circular wound with nonviable tissue to the distal portion of the leg. Left lower extremity: T the proximal aspect below the knee there is an open wound with nonviable o surface. Integumentary (Hair, Skin) Wound #2 status is Open. Original cause of wound was Trauma. The date acquired was: 01/04/2022. The wound has been in treatment 2 weeks. The wound is located on the Left,Anterior Lower Leg. The wound measures 1.1cm length x 0.7cm width x 0.1cm depth; 0.605cm^2 area and 0.06cm^3 volume. There is Fat Layer (Subcutaneous Tissue) exposed. There is no tunneling or undermining noted. There is a medium amount of serosanguineous drainage noted. The wound margin is distinct with the outline attached to the wound base. There is small (1-33%) pink granulation within the wound bed. There is a large (67-100%) amount of necrotic tissue within the wound bed including Adherent Slough. Wound #3 status is Open. Original cause of wound was Trauma. The date acquired was: 01/24/2022. The wound has been in treatment 2 weeks. The wound is located on the Right,Medial Lower Leg. The wound measures 4.7cm length x 3.4cm width x 0.1cm depth; 12.551cm^2 area and 1.255cm^3 volume. There is Fat Layer (Subcutaneous Tissue) exposed. There is no tunneling or undermining noted. There is a medium amount of serosanguineous drainage noted. The wound margin is distinct with the outline attached to the wound base. There is medium (34-66%) red, pink granulation within the wound bed. There is a medium (34-66%) amount of necrotic tissue within the wound bed including Adherent Slough. Wound #4 status is Open. Original cause of wound  was Trauma. The date acquired was: 01/04/2022. The wound has been in treatment 2 weeks. The wound is located on the Right,Anterior Lower Leg. The wound measures 1cm length x 0.9cm width x 0.1cm depth; 0.707cm^2 area and 0.071cm^3 volume. There is Fat Layer (Subcutaneous Tissue) exposed. There is no tunneling or undermining noted. There is a medium amount of serosanguineous drainage noted. The wound margin is distinct with the outline attached to the wound base. There is small (1-33%) red, pink granulation within the wound bed. There is a large (67-100%) amount of necrotic tissue within the wound bed including Adherent Slough. 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 I was able to debride nonviable tissue to the more proximal right lower extremity wound. No obvious signs of surrounding soft tissue infection to any of the wound beds. I recommended continuing the [facility] antibiotic but will add Sorbact instead of Hydrofera Blue as the wounds appear dry. Follow-up in 1 week. Procedures Wound #3 Pre-procedure diagnosis of Wound #3 is an Abrasion located on the Right,Medial Lower Leg . There was a Excisional Skin/Subcutaneous Tissue Debridement with a total area of 15.98 sq cm performed by Kalman Shan, DO. With the following instrument(s): Curette to remove Non-Viable tissue/material. Material removed includes Subcutaneous Tissue and Slough and after achieving pain control using Other (Benzocaine 20% spray). No specimens were taken. A time out was conducted at 09:36, prior to the start of the procedure. A Minimum amount of bleeding was controlled with Pressure. The procedure was  tolerated well with a pain level of 2 throughout and a pain level of 0 following the procedure. Post Debridement Measurements: 4.7cm length x 3.4cm width x 0.1cm depth; 1.255cm^3 volume. Character of Wound/Ulcer Post Debridement is improved. Post procedure Diagnosis Wound #3: Same as Pre-Procedure Plan Follow-up Appointments: Return Appointment in 1 week. - Dr. Heber McCammon and [place], Room 8 02/24/2022 0815 Bathing/ Shower/ Hygiene: May shower and wash wound with soap and water. - with dressing changes. Edema Control - Lymphedema / SCD / Other: Elevate legs to the level of the heart or above for 30 minutes daily and/or when sitting, a frequency of: - 3-4 times a day throughout the day. Avoid standing for long periods of time. Exercise regularly Moisturize legs daily. - every night before bed. do not get directly on wounds WOUND #2: - Lower Leg Wound Laterality: Left, Anterior Cleanser: Soap and Water 1 x Per Day/30 Days Discharge Instructions: May shower and wash wound with dial antibacterial soap and water prior to dressing change. Peri-Wound Care: Skin Prep (Generic) 1 x Per Day/30 Days Discharge Instructions: Use skin prep as directed Prim Dressing: Cutimed Sorbact Swab 1 x Per Day/30 Days ary Discharge Instructions: Apply to wound bed as instructed Secondary Dressing: Bordered Gauze, 2x3.75 in (Generic) 1 x Per Day/30 Days Discharge Instructions: Apply over primary dressing as directed. WOUND #3: - Lower Leg Wound Laterality: Right, Medial Cleanser: Soap and Water 1 x Per Day/30 Days Discharge Instructions: May shower and wash wound with dial antibacterial soap and water prior to dressing change. Peri-Wound Care: Skin Prep (Generic) 1 x Per Day/30 Days Discharge Instructions: Use skin prep as directed Prim Dressing: Cutimed Sorbact Swab 1 x Per Day/30 Days ary Discharge Instructions: Apply to wound bed as instructed Secondary Dressing: Bordered Gauze, 2x3.75 in (Generic) 1 x Per Day/30  Days Discharge Instructions: Apply over primary dressing as directed. WOUND #4: - Lower Leg Wound Laterality: Right, Anterior Cleanser: Soap and Water 1 x Per Day/30 Days Discharge Instructions: May shower and wash wound with dial antibacterial soap and water prior to dressing change. Peri-Wound Care: Skin Prep (Generic) 1 x Per Day/30 Days Discharge Instructions: Use skin prep as directed Prim Dressing: Cutimed Sorbact Swab 1 x Per Day/30 Days ary Discharge Instructions: Apply to wound bed as instructed Secondary Dressing: Bordered Gauze, 2x3.75 in (Generic) 1 x Per Day/30 Days Discharge Instructions: Apply over primary dressing as directed. 1. In office sharp debridement 2. Keystone antibiotic with Sorbact 3. Follow-up in 1 week Electronic Signature(s) Signed: 02/17/2022 10:45:46 AM By: Kalman Shan DO Entered By: Kalman Shan on 02/17/2022 10:44:37 -------------------------------------------------------------------------------- HxROS Details Patient  Name: Date of Service: Brian Leon, [name] 02/17/2022 9:00 A M Medical Record Number: [id number] Patient Account Number: [id number] Date of Birth/Sex: Treating RN: 09-Oct-1945 (76 y.o. Brian Leon Primary Care Provider: Gerlene Fee Other Clinician: Referring Provider: Treating Provider/Extender: Hubert Azure in Treatment: 14 Information Obtained From Chart Eyes Medical History: Negative for: Cataracts; Glaucoma; Optic Neuritis Past Medical History Notes: Blind right eye WEARS PROSTHESIS Ear/Nose/Mouth/Throat Medical History: Negative for: Chronic sinus problems/congestion; Middle ear problems Past Medical History Notes: Allergic rhinitis Hematologic/Lymphatic Medical History: Negative for: Anemia; Hemophilia; Human Immunodeficiency Virus; Lymphedema; Sickle Cell Disease Respiratory Medical History: Positive for: Asthma Negative for: Aspiration; Chronic Obstructive Pulmonary  Disease (COPD); Pneumothorax; Sleep Apnea; Tuberculosis Past Medical History Notes: Pneumonia Cardiovascular Medical History: Positive for: Hypertension Negative for: Angina; Arrhythmia; Congestive Heart Failure; Coronary Artery Disease; Deep Vein Thrombosis; Hypotension; Myocardial Infarction; Peripheral Arterial Disease; Peripheral Venous Disease; Phlebitis; Vasculitis Past Medical History Notes: Thoracic aortic aneurysm Thrombocytosis Gastrointestinal Medical History: Positive for: Hepatitis A Negative for: Cirrhosis ; Colitis; Crohns; Hepatitis B; Hepatitis C Endocrine Medical History: Negative for: Type I Diabetes; Type II Diabetes Genitourinary Medical History: Negative for: End Stage Renal Disease Past Medical History Notes: 1/3kidney missing per patient-Renal Cell carcinoma Renal Cyst 5 years ago Immunological Medical History: Negative for: Lupus Erythematosus; Raynauds; Scleroderma Integumentary (Skin) Medical History: Negative for: History of Burn Musculoskeletal Medical History: Positive for: Rheumatoid Arthritis Negative for: Gout; Osteoarthritis; Osteomyelitis Past Medical History Notes: 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 Eczema Neurologic Medical History: Past Medical History Notes: Paraureteric diverticulum Oncologic Medical History: Past Medical History Notes: Renal Cell carcinoma- 5 years ago Psychiatric Medical History: Negative for: Anorexia/bulimia; Confinement Anxiety Immunizations Pneumococcal Vaccine: Received Pneumococcal Vaccination: Yes Received Pneumococcal Vaccination On or After 60th Birthday: Yes Implantable Devices No devices added Hospitalization / Surgery History Type of Hospitalization/Surgery 5 years ago Renal Cell carcinoma Family and Social History Cancer: Yes - Mother; Diabetes: No; Heart Disease: No; Hypertension: No; Kidney Disease: No; Lung Disease: No; Seizures:  No; Stroke: No; Thyroid Problems: No; Tuberculosis: No; Former smoker - quit 40 years ago; Marital Status - Married; Alcohol Use: Never; Drug Use: Current History - marijuana; Caffeine Use: Rarely; Financial Concerns: No; Food, Clothing or Shelter Needs: No; Support System Lacking: No; Transportation Concerns: No Electronic Signature(s) Signed: 02/17/2022 10:45:46 AM By: Kalman Shan DO Signed: 02/22/2022 4:34:51 PM By: Deon Pilling RN, BSN Entered By: Kalman Shan on 02/17/2022 10:34:53 -------------------------------------------------------------------------------- SuperBill Details Patient Name: Date of Service: Brian Leon, RO NN P. 02/17/2022 Medical Record Number: [id number] Patient Account Number: [id number] Date of Birth/Sex: Treating RN: 12/15/1945 (76 y.o. 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

## 2022-02-24 ENCOUNTER — Encounter (HOSPITAL_BASED_OUTPATIENT_CLINIC_OR_DEPARTMENT_OTHER): Payer: Medicare Other | Admitting: Internal Medicine

## 2022-02-24 DIAGNOSIS — L97822 Non-pressure chronic ulcer of other part of left lower leg with fat layer exposed: Secondary | ICD-10-CM | POA: Diagnosis not present

## 2022-02-24 DIAGNOSIS — T1490XA Injury, unspecified, initial encounter: Secondary | ICD-10-CM | POA: Diagnosis not present

## 2022-02-24 DIAGNOSIS — L97912 Non-pressure chronic ulcer of unspecified part of right lower leg with fat layer exposed: Secondary | ICD-10-CM | POA: Diagnosis not present

## 2022-02-24 DIAGNOSIS — M353 Polymyalgia rheumatica: Secondary | ICD-10-CM | POA: Diagnosis not present

## 2022-02-24 DIAGNOSIS — Z87891 Personal history of nicotine dependence: Secondary | ICD-10-CM | POA: Diagnosis not present

## 2022-02-24 DIAGNOSIS — I872 Venous insufficiency (chronic) (peripheral): Secondary | ICD-10-CM | POA: Diagnosis not present

## 2022-02-24 NOTE — Progress Notes (Signed)
Brian Leon ([id number]) Visit Report for 02/24/2022 Arrival Information Details Patient Name: Date of Service: Brian Leon, [name] 02/24/2022 8:15 A M Medical Record Number: [id number] Patient Account Number: [id number] Date of Birth/Sex: Treating RN: 15-Jul-1946 (76 y.o. Brian Leon, [name] Primary Care Brian Leon: Gerlene Fee Other Clinician: Referring Brian Leon: Treating Brian Leon/Extender: Hubert Azure in Treatment: 15 Visit Information History Since Last Visit Added or deleted any medications: No Patient Arrived: Ambulatory Any new allergies or adverse reactions: No Arrival Time: 08:15 Had a fall or experienced change in No Accompanied By: self activities of daily living that may affect Transfer Assistance: None risk of falls: Patient Identification Verified: Yes Signs or symptoms of abuse/neglect since last visito No Secondary Verification Process Completed: Yes Hospitalized since last visit: No Patient Requires Transmission-Based Precautions: No Implantable device outside of the clinic excluding No Patient Has Alerts: Yes cellular tissue based products placed in the center Patient Alerts: 11/02/21 L ABI: 1.37 since last visit: 11/02/2021 L TBI 0.77 Has Dressing in Place as Prescribed: Yes 11/02/2021 R TBI 0.69 Pain Present Now: Yes 11/02/2021 R ABI 1.3 Electronic Signature(s) Signed: 02/24/2022 5:01:49 PM By: Deon Pilling RN, BSN Entered By: Deon Pilling on 02/24/2022 08:30:29 -------------------------------------------------------------------------------- Encounter Discharge Information Details Patient Name: Date of Service: Brian Leon, RO NN P. 02/24/2022 8:15 A M Medical Record Number: [id number] Patient Account Number: [id number] Date of Birth/Sex: Treating RN: 05-10-1946 (76 y.o. Brian Leon Primary Care Asiah Befort: Gerlene Fee Other Clinician: Referring Cordaro Mukai: Treating Kennadi Albany/Extender: Hubert Azure in Treatment: 15 Encounter Discharge Information Items Post Procedure Vitals Discharge Condition: Stable Temperature (F): 98 Ambulatory Status: Ambulatory Pulse (bpm): 61 Discharge Destination: Home Respiratory Rate (breaths/min): 20 Transportation: Private Auto Blood Pressure (mmHg): 158/81 Accompanied By: self Schedule Follow-up Appointment: Yes Clinical Summary of Care: Electronic Signature(s) Signed: 02/24/2022 5:01:49 PM By: Deon Pilling RN, BSN Entered By: Deon Pilling on 02/24/2022 08:55:46 -------------------------------------------------------------------------------- Lower Extremity Assessment Details Patient Name: Date of Service: Brian Leon, RO NN P. 02/24/2022 8:15 A M Medical Record Number: [id number] Patient Account Number: [id number] Date of Birth/Sex: Treating RN: 03-13-1946 (76 y.o. Brian Leon Primary Care Tex Conroy: Gerlene Fee Other Clinician: Referring Alper Guilmette: Treating Carrissa Taitano/Extender: Hubert Azure in Treatment: 15 Edema Assessment Assessed: Brian Leon: Yes] Brian Leon: Yes] Edema: [Left: No] [Right: Yes] Calf Left: Right: Point of Measurement: 37 cm From Medial Instep 30 cm 30 cm Ankle Left: Right: Point of Measurement: 12 cm From Medial Instep 20 cm 20 cm Vascular Assessment Pulses: Dorsalis Pedis Palpable: [Left:Yes] [Right:Yes] Electronic Signature(s) Signed: 02/24/2022 5:01:49 PM By: Deon Pilling RN, BSN Entered By: Deon Pilling on 02/24/2022 08:31:34 -------------------------------------------------------------------------------- Multi Wound Chart Details Patient Name: Date of Service: Brian Leon, RO NN P. 02/24/2022 8:15 A M Medical Record Number: [id number] Patient Account Number: [id number] Date of Birth/Sex: Treating RN: 30-Aug-1946 (76 y.o. Brian Leon Primary Care Aniesa Boback: Gerlene Fee Other Clinician: Referring Chevonne Bostrom: Treating Yazlin Ekblad/Extender: Hubert Azure in Treatment: 15 Vital Signs Height(in): 26 Pulse(bpm): 56 Weight(lbs): 140 Blood Pressure(mmHg): 158/81 Body Mass Index(BMI): 21.3 Temperature(F): 98 Respiratory Rate(breaths/min): 16 Photos: [2:Left, Anterior Lower Leg] [3:Right, Medial Lower Leg] [4:Right, Anterior Lower Leg] Wound Location: [2:Trauma] [3:Trauma] [4:Trauma] Wounding Event: [2:Abrasion] [3:Abrasion] [4:Abrasion] Primary Etiology: [2:Asthma, Hypertension, Hepatitis A, Asthma, Hypertension, Hepatitis A, Asthma, Hypertension, Hepatitis A,] Comorbid History: [2:Rheumatoid Arthritis 01/04/2022] [3:Rheumatoid Arthritis 01/24/2022] [4:Rheumatoid Arthritis 01/04/2022] Date Acquired: [2:3] [3:3] [4:3] Weeks of Treatment: [2:Open] [3:Open] [4:Open] Wound Status: [2:No] [3:No] [  4:No] Wound Recurrence: [2:1.1x0.8x0.1] [3:4.4x3x0.1] [4:0.9x1x0.1] Measurements L x W x D (cm) [2:0.691] [3:10.367] [4:0.707] A (cm) : rea [2:0.069] [3:1.037] [4:0.071] Volume (cm) : [2:-63.00%] [3:16.20%] [4:-114.20%] % Reduction in A [2:rea: -64.30%] [3:16.20%] [4:-115.20%] % Reduction in Volume: [2:Full Thickness Without Exposed] [3:Full Thickness Without Exposed] [4:Full Thickness Without Exposed] Classification: [2:Support Structures Large] [3:Support Structures Medium] [4:Support Structures Medium] Exudate A mount: [2:Serous] [3:Serosanguineous] [4:Serosanguineous] Exudate Type: [2:amber] [3:red, brown] [4:red, brown] Exudate Color: [2:Distinct, outline attached] [3:Distinct, outline attached] [4:Distinct, outline attached] Wound Margin: [2:None Present (0%)] [3:Large (67-100%)] [4:None Present (0%)] Granulation A mount: [2:N/A] [3:Red, Pink] [4:N/A] Granulation Quality: [2:Large (67-100%)] [3:Small (1-33%)] [4:Large (67-100%)] Necrotic A mount: [2:Fat Layer (Subcutaneous Tissue): Yes Fat Layer (Subcutaneous Tissue): Yes Fat Layer (Subcutaneous Tissue): Yes] Exposed Structures: [2:Fascia: No Tendon: No Muscle:  No Joint: No Bone: No Small (1-33%)] [3:Fascia: No Tendon: No Muscle: No Joint: No Bone: No Medium (34-66%)] [4:Fascia: No Tendon: No Muscle: No Joint: No Bone: No None] Epithelialization: [2:Debridement - Selective/Open Wound Debridement - Excisional] [4:Debridement - Excisional] Debridement: Pre-procedure Verification/Time Out 08:45 [3:08:45] [4:08:45] Taken: [2:Lidocaine 5% topical ointment] [3:Lidocaine 5% topical ointment] [4:Lidocaine 5% topical ointment] Pain Control: [2:Slough] [3:Subcutaneous, Slough] [4:Subcutaneous, Slough] Tissue Debrided: [2:Non-Viable Tissue] [3:Skin/Subcutaneous Tissue] [4:Skin/Subcutaneous Tissue] Level: [2:0.88] [3:13.2] [4:0.9] Debridement A (sq cm): [2:rea Curette] [3:Curette] [4:Curette] Instrument: [2:Minimum] [3:Minimum] [4:Minimum] Bleeding: [2:Pressure] [3:Pressure] [4:Pressure] Hemostasis A chieved: [2:0] [3:0] [4:0] Procedural Pain: [2:2] [3:2] [4:2] Post Procedural Pain: [2:Procedure was tolerated well] [3:Procedure was tolerated well] [4:Procedure was tolerated well] Debridement Treatment Response: [2:1.1x0.8x0.1] [3:4.4x3x0.1] [4:0.9x1x0.1] Post Debridement Measurements L x W x D (cm) [2:0.069] [3:1.037] [4:0.071] Post Debridement Volume: (cm) [2:Debridement] [3:Debridement] [4:Debridement] Treatment Notes Wound #2 (Lower Leg) Wound Laterality: Left, Anterior Cleanser Soap and Water Discharge Instruction: May shower and wash wound with dial antibacterial soap and water prior to dressing change. Peri-Wound Care Sween Lotion (Moisturizing lotion) Discharge Instruction: Apply moisturizing lotion as directed Topical topical compounding antibiotics Discharge Instruction: apply directly to wound bed. Primary Dressing IODOFLEX 0.9% Cadexomer Iodine Pad 4x6 cm Discharge Instruction: Apply to wound bed as instructed Secondary Dressing ABD Pad, 8x10 Discharge Instruction: Apply over primary dressing as directed. Secured With Compression  Wrap Kerlix Roll 4.5x3.1 (in/yd) Discharge Instruction: Apply Kerlix and Coban compression as directed. Coban Self-Adherent Wrap 4x5 (in/yd) Discharge Instruction: Apply over Kerlix as directed. Compression Stockings Add-Ons Wound #3 (Lower Leg) Wound Laterality: Right, Medial Cleanser Soap and Water Discharge Instruction: May shower and wash wound with dial antibacterial soap and water prior to dressing change. Peri-Wound Care Sween Lotion (Moisturizing lotion) Discharge Instruction: Apply moisturizing lotion as directed Topical topical compounding antibiotics Discharge Instruction: apply directly to wound bed. Primary Dressing IODOFLEX 0.9% Cadexomer Iodine Pad 4x6 cm Discharge Instruction: Apply to wound bed as instructed Secondary Dressing ABD Pad, 8x10 Discharge Instruction: Apply over primary dressing as directed. Secured With Compression Wrap Kerlix Roll 4.5x3.1 (in/yd) Discharge Instruction: Apply Kerlix and Coban compression as directed. Coban Self-Adherent Wrap 4x5 (in/yd) Discharge Instruction: Apply over Kerlix as directed. Compression Stockings Add-Ons Wound #4 (Lower Leg) Wound Laterality: Right, Anterior Cleanser Soap and Water Discharge Instruction: May shower and wash wound with dial antibacterial soap and water prior to dressing change. Peri-Wound Care Sween Lotion (Moisturizing lotion) Discharge Instruction: Apply moisturizing lotion as directed Topical topical compounding antibiotics Discharge Instruction: apply directly to wound bed. Primary Dressing IODOFLEX 0.9% Cadexomer Iodine Pad 4x6 cm Discharge Instruction: Apply to wound bed as instructed Secondary Dressing ABD Pad, 8x10 Discharge Instruction: Apply over primary dressing as  directed. Secured With Compression Wrap Kerlix Roll 4.5x3.1 (in/yd) Discharge Instruction: Apply Kerlix and Coban compression as directed. Coban Self-Adherent Wrap 4x5 (in/yd) Discharge Instruction: Apply over Kerlix  as directed. Compression Stockings Add-Ons Electronic Signature(s) Signed: 02/24/2022 9:21:59 AM By: Kalman Shan DO Signed: 02/24/2022 5:01:49 PM By: Deon Pilling RN, BSN Entered By: Kalman Shan on 02/24/2022 09:14:27 -------------------------------------------------------------------------------- Multi-Disciplinary Care Plan Details Patient Name: Date of Service: Brian Leon, RO NN P. 02/24/2022 8:15 A M Medical Record Number: [id number] Patient Account Number: [id number] Date of Birth/Sex: Treating RN: 10-Nov-1945 (76 y.o. Brian Leon Primary Care Ramsie Ostrander: Gerlene Fee Other Clinician: Referring Rosibel Giacobbe: Treating Harlyn Italiano/Extender: Hubert Azure in Treatment: 15 Active Inactive Necrotic Tissue Nursing Diagnoses: Impaired tissue integrity related to necrotic/devitalized tissue Knowledge deficit related to management of necrotic/devitalized tissue Goals: Necrotic/devitalized tissue will be minimized in the wound bed Date Initiated: 02/03/2022 Target Resolution Date: 04/01/2022 Goal Status: Active Patient/caregiver will verbalize understanding of reason and process for debridement of necrotic tissue Date Initiated: 02/03/2022 Target Resolution Date: 04/01/2022 Goal Status: Active Interventions: Assess patient pain level pre-, during and post procedure and prior to discharge Provide education on necrotic tissue and debridement process Treatment Activities: Apply topical anesthetic as ordered : 02/03/2022 Excisional debridement : 02/03/2022 Notes: Electronic Signature(s) Signed: 02/24/2022 5:01:49 PM By: Deon Pilling RN, BSN Entered By: Deon Pilling on 02/24/2022 08:32:23 -------------------------------------------------------------------------------- Pain Assessment Details Patient Name: Date of Service: Brian Leon, RO NN P. 02/24/2022 8:15 A M Medical Record Number: [id number] Patient Account Number: [id number] Date of  Birth/Sex: Treating RN: 07-29-1946 (76 y.o. Brian Leon Primary Care Maddi Collar: Gerlene Fee Other Clinician: Referring Kasmira Cacioppo: Treating Kelina Beauchamp/Extender: Hubert Azure in Treatment: 15 Active Problems Location of Pain Severity and Description of Pain Patient Has Paino Yes Patient Has Paino Yes Site Locations Pain Location: Pain in Ulcers Rate the pain. Current Pain Level: 6 Worst Pain Level: 10 Least Pain Level: 0 Tolerable Pain Level: 8 Pain Management and Medication Current Pain Management: Medication: No Cold Application: No Rest: No Massage: No Activity: No T.E.N.S.: No Heat Application: No Leg drop or elevation: No Is the Current Pain Management Adequate: Adequate How does your wound impact your activities of daily livingo Sleep: No Bathing: No Appetite: No Relationship With Others: No Bladder Continence: No Emotions: No Bowel Continence: No Work: No Toileting: No Drive: No Dressing: No Hobbies: No Engineer, maintenance) Signed: 02/24/2022 5:01:49 PM By: Deon Pilling RN, BSN Entered By: Deon Pilling on 02/24/2022 08:31:18 -------------------------------------------------------------------------------- Patient/Caregiver Education Details Patient Name: Date of Service: Brian Leon, RO NN P. 5/25/2023andnbsp8:15 A M Medical Record Number: [id number] Patient Account Number: [id number] Date of Birth/Gender: Treating RN: Jul 12, 1946 (76 y.o. Brian Leon Primary Care Physician: Gerlene Fee Other Clinician: Referring Physician: Treating Physician/Extender: Hubert Azure in Treatment: 15 Education Assessment Education Provided To: Patient Education Topics Provided Wound Debridement: Handouts: Wound Debridement Methods: Explain/Verbal Responses: Reinforcements needed Electronic Signature(s) Signed: 02/24/2022 5:01:49 PM By: Deon Pilling RN, BSN Entered By: Deon Pilling on  02/24/2022 08:32:34 -------------------------------------------------------------------------------- Wound Assessment Details Patient Name: Date of Service: Brian Leon, RO NN P. 02/24/2022 8:15 A M Medical Record Number: [id number] Patient Account Number: [id number] Date of Birth/Sex: Treating RN: 1945/11/06 (76 y.o. Brian Leon Primary Care Oluwatobi Ruppe: Gerlene Fee Other Clinician: Referring Barbette Mcglaun: Treating Ryland Tungate/Extender: Hubert Azure in Treatment: 15 Wound Status Wound Number: 2 Primary Etiology: Abrasion Wound Location: Left, Anterior Lower Leg Wound Status: Open Wounding Event: Trauma  Comorbid History: Asthma, Hypertension, Hepatitis A, Rheumatoid Arthritis Date Acquired: 01/04/2022 Weeks Of Treatment: 3 Clustered Wound: No Photos Wound Measurements Length: (cm) 1.1 Width: (cm) 0.8 Depth: (cm) 0.1 Area: (cm) 0.691 Volume: (cm) 0.069 % Reduction in Area: -63% % Reduction in Volume: -64.3% Epithelialization: Small (1-33%) Tunneling: No Undermining: No Wound Description Classification: Full Thickness Without Exposed Support Structures Wound Margin: Distinct, outline attached Exudate Amount: Large Exudate Type: Serous Exudate Color: amber Foul Odor After Cleansing: No Slough/Fibrino No Wound Bed Granulation Amount: None Present (0%) Exposed Structure Necrotic Amount: Large (67-100%) Fascia Exposed: No Necrotic Quality: Adherent Slough Fat Layer (Subcutaneous Tissue) Exposed: Yes Tendon Exposed: No Muscle Exposed: No Joint Exposed: No Bone Exposed: No Treatment Notes Wound #2 (Lower Leg) Wound Laterality: Left, Anterior Cleanser Soap and Water Discharge Instruction: May shower and wash wound with dial antibacterial soap and water prior to dressing change. Peri-Wound Care Sween Lotion (Moisturizing lotion) Discharge Instruction: Apply moisturizing lotion as directed Topical topical compounding  antibiotics Discharge Instruction: apply directly to wound bed. Primary Dressing IODOFLEX 0.9% Cadexomer Iodine Pad 4x6 cm Discharge Instruction: Apply to wound bed as instructed Secondary Dressing ABD Pad, 8x10 Discharge Instruction: Apply over primary dressing as directed. Secured With Compression Wrap Kerlix Roll 4.5x3.1 (in/yd) Discharge Instruction: Apply Kerlix and Coban compression as directed. Coban Self-Adherent Wrap 4x5 (in/yd) Discharge Instruction: Apply over Kerlix as directed. Compression Stockings Add-Ons Electronic Signature(s) Signed: 02/24/2022 5:01:49 PM By: Deon Pilling RN, BSN Entered By: Deon Pilling on 02/24/2022 08:28:43 -------------------------------------------------------------------------------- Wound Assessment Details Patient Name: Date of Service: Brian Leon, RO NN P. 02/24/2022 8:15 A M Medical Record Number: [id number] Patient Account Number: [id number] Date of Birth/Sex: Treating RN: 04-20-1946 (76 y.o. Brian Leon Primary Care Trenese Haft: Gerlene Fee Other Clinician: Referring Jolissa Kapral: Treating Jaquala Fuller/Extender: Hubert Azure in Treatment: 15 Wound Status Wound Number: 3 Primary Etiology: Abrasion Wound Location: Right, Medial Lower Leg Wound Status: Open Wounding Event: Trauma Comorbid History: Asthma, Hypertension, Hepatitis A, Rheumatoid Arthritis Date Acquired: 01/24/2022 Weeks Of Treatment: 3 Clustered Wound: No Photos Wound Measurements Length: (cm) 4.4 Width: (cm) 3 Depth: (cm) 0.1 Area: (cm) 10.367 Volume: (cm) 1.037 Wound Description Classification: Full Thickness Without Exposed Support Structu Wound Margin: Distinct, outline attached Exudate Amount: Medium Exudate Type: Serosanguineous Exudate Color: red, brown res Foul Odor After Cleansing: No Slough/Fibrino Yes % Reduction in Area: 16.2% % Reduction in Volume: 16.2% Epithelialization: Medium (34-66%) Tunneling:  No Undermining: No Wound Bed Granulation Amount: Large (67-100%) Exposed Structure Granulation Quality: Red, Pink Fascia Exposed: No Necrotic Amount: Small (1-33%) Fat Layer (Subcutaneous Tissue) Exposed: Yes Necrotic Quality: Adherent Slough Tendon Exposed: No Muscle Exposed: No Joint Exposed: No Bone Exposed: No Treatment Notes Wound #3 (Lower Leg) Wound Laterality: Right, Medial Cleanser Soap and Water Discharge Instruction: May shower and wash wound with dial antibacterial soap and water prior to dressing change. Peri-Wound Care Sween Lotion (Moisturizing lotion) Discharge Instruction: Apply moisturizing lotion as directed Topical topical compounding antibiotics Discharge Instruction: apply directly to wound bed. Primary Dressing IODOFLEX 0.9% Cadexomer Iodine Pad 4x6 cm Discharge Instruction: Apply to wound bed as instructed Secondary Dressing ABD Pad, 8x10 Discharge Instruction: Apply over primary dressing as directed. Secured With Compression Wrap Kerlix Roll 4.5x3.1 (in/yd) Discharge Instruction: Apply Kerlix and Coban compression as directed. Coban Self-Adherent Wrap 4x5 (in/yd) Discharge Instruction: Apply over Kerlix as directed. Compression Stockings Add-Ons Electronic Signature(s) Signed: 02/24/2022 5:01:49 PM By: Deon Pilling RN, BSN Entered By: Deon Pilling on 02/24/2022 08:29:12 -------------------------------------------------------------------------------- Wound Assessment Details  Patient Name: Date of Service: Brian Leon, [name] 02/24/2022 8:15 A M Medical Record Number: [id number] Patient Account Number: [id number] Date of Birth/Sex: Treating RN: [birth year] (76 y.o. Brian Leon Primary Care Kasara Schomer: Gerlene Fee Other Clinician: Referring Jenni Thew: Treating Jerome Viglione/Extender: Hubert Azure in Treatment: 15 Wound Status Wound Number: 4 Primary Etiology: Abrasion Wound Location: Right, Anterior Lower  Leg Wound Status: Open Wounding Event: Trauma Comorbid History: Asthma, Hypertension, Hepatitis A, Rheumatoid Arthritis Date Acquired: 01/04/2022 Weeks Of Treatment: 3 Clustered Wound: No Photos Wound Measurements Length: (cm) 0.9 Width: (cm) 1 Depth: (cm) 0.1 Area: (cm) 0.707 Volume: (cm) 0.071 % Reduction in Area: -114.2% % Reduction in Volume: -115.2% Epithelialization: None Tunneling: No Undermining: No Wound Description Classification: Full Thickness Without Exposed Support Structures Wound Margin: Distinct, outline attached Exudate Amount: Medium Exudate Type: Serosanguineous Exudate Color: red, brown Foul Odor After Cleansing: No Slough/Fibrino Yes Wound Bed Granulation Amount: None Present (0%) Exposed Structure Necrotic Amount: Large (67-100%) Fascia Exposed: No Necrotic Quality: Adherent Slough Fat Layer (Subcutaneous Tissue) Exposed: Yes Tendon Exposed: No Muscle Exposed: No Joint Exposed: No Bone Exposed: No Treatment Notes Wound #4 (Lower Leg) Wound Laterality: Right, Anterior Cleanser Soap and Water Discharge Instruction: May shower and wash wound with dial antibacterial soap and water prior to dressing change. Peri-Wound Care Sween Lotion (Moisturizing lotion) Discharge Instruction: Apply moisturizing lotion as directed Topical topical compounding antibiotics Discharge Instruction: apply directly to wound bed. Primary Dressing IODOFLEX 0.9% Cadexomer Iodine Pad 4x6 cm Discharge Instruction: Apply to wound bed as instructed Secondary Dressing ABD Pad, 8x10 Discharge Instruction: Apply over primary dressing as directed. Secured With Compression Wrap Kerlix Roll 4.5x3.1 (in/yd) Discharge Instruction: Apply Kerlix and Coban compression as directed. Coban Self-Adherent Wrap 4x5 (in/yd) Discharge Instruction: Apply over Kerlix as directed. Compression Stockings Add-Ons Electronic Signature(s) Signed: 02/24/2022 5:01:49 PM By: Deon Pilling RN,  BSN Entered By: Deon Pilling on 02/24/2022 08:29:41 -------------------------------------------------------------------------------- Vitals Details Patient Name: Date of Service: Brian Leon, RO NN P. 02/24/2022 8:15 A M Medical Record Number: [id number] Patient Account Number: [id number] Date of Birth/Sex: Treating RN: 03-22-1946 (76 y.o. Brian Leon Primary Care Preeya Cleckley: Gerlene Fee Other Clinician: Referring Asianae Minkler: Treating Presly Steinruck/Extender: Hubert Azure in Treatment: 15 Vital Signs Time Taken: 08:15 Temperature (F): 98 Height (in): 68 Pulse (bpm): 61 Weight (lbs): 140 Respiratory Rate (breaths/min): 16 Body Mass Index (BMI): 21.3 Blood Pressure (mmHg): 158/81 Reference Range: 80 - 120 mg / dl Electronic Signature(s) Signed: 02/24/2022 5:01:49 PM By: Deon Pilling RN, BSN Entered By: Deon Pilling on 02/24/2022 08:30:51

## 2022-02-24 NOTE — Progress Notes (Signed)
Brian Leon ([id number]) Visit Report for 02/24/2022 Chief Complaint Document Details Patient Name: Date of Service: Brian Leon, [name] 02/24/2022 8:15 A M Medical Record Number: [id number] Patient Account Number: [id number] Date of Birth/Sex: Treating RN: 18-Aug-1946 (76 y.o. Brian Leon Primary Care Provider: Gerlene Fee Other Clinician: Referring Provider: Treating Provider/Extender: Hubert Azure in Treatment: 15 Information Obtained from: Patient Chief Complaint 11/11/2021; Left lower extremity wound due to trauma 02/03/2022; bilateral lower extremity wounds due to trauma Electronic Signature(s) Signed: 02/24/2022 9:21:59 AM By: Kalman Shan DO Entered By: Kalman Shan on 02/24/2022 09:14:40 -------------------------------------------------------------------------------- Debridement Details Patient Name: Date of Service: Brian Leon, RO NN P. 02/24/2022 8:15 A M Medical Record Number: [id number] Patient Account Number: [id number] Date of Birth/Sex: Treating RN: 24-May-1946 (76 y.o. Brian Leon, [name] Primary Care Provider: Gerlene Fee Other Clinician: Referring Provider: Treating Provider/Extender: Hubert Azure in Treatment: 15 Debridement Performed for Assessment: Wound #3 Right,Medial Lower Leg Performed By: Physician Kalman Shan, DO Debridement Type: Debridement Level of Consciousness (Pre-procedure): Awake and Alert Pre-procedure Verification/Time Out Yes - 08:45 Taken: Start Time: 08:46 Pain Control: Lidocaine 5% topical ointment T Area Debrided (L x W): otal 4.4 (cm) x 3 (cm) = 13.2 (cm) Tissue and other material debrided: Viable, Non-Viable, Slough, Subcutaneous, Skin: Dermis , Skin: Epidermis, Fibrin/Exudate, Slough Level: Skin/Subcutaneous Tissue Debridement Description: Excisional Instrument: Curette Bleeding: Minimum Hemostasis Achieved: Pressure End Time: 08:50 Procedural  Pain: 0 Post Procedural Pain: 2 Response to Treatment: Procedure was tolerated well Level of Consciousness (Post- Awake and Alert procedure): Post Debridement Measurements of Total Wound Length: (cm) 4.4 Width: (cm) 3 Depth: (cm) 0.1 Volume: (cm) 1.037 Character of Wound/Ulcer Post Debridement: Improved Post Procedure Diagnosis Same as Pre-procedure Electronic Signature(s) Signed: 02/24/2022 9:21:59 AM By: Kalman Shan DO Signed: 02/24/2022 5:01:49 PM By: Deon Pilling RN, BSN Entered By: Deon Pilling on 02/24/2022 08:51:08 -------------------------------------------------------------------------------- Debridement Details Patient Name: Date of Service: Brian Leon, [place] NN P. 02/24/2022 8:15 A M Medical Record Number: [id number] Patient Account Number: [id number] Date of Birth/Sex: Treating RN: [birth year] (76 y.o. Brian Leon, [name] Primary Care Provider: Gerlene Fee Other Clinician: Referring Provider: Treating Provider/Extender: Hubert Azure in Treatment: 15 Debridement Performed for Assessment: Wound #2 Left,Anterior Lower Leg Performed By: Physician Kalman Shan, DO Debridement Type: Debridement Level of Consciousness (Pre-procedure): Awake and Alert Pre-procedure Verification/Time Out Yes - 08:45 Taken: Start Time: 08:46 Pain Control: Lidocaine 5% topical ointment T Area Debrided (L x W): otal 1.1 (cm) x 0.8 (cm) = 0.88 (cm) Tissue and other material debrided: Non-Viable, Slough, Slough Level: Non-Viable Tissue Debridement Description: Selective/Open Wound Instrument: Curette Bleeding: Minimum Hemostasis Achieved: Pressure End Time: 08:50 Procedural Pain: 0 Post Procedural Pain: 2 Response to Treatment: Procedure was tolerated well Level of Consciousness (Post- Awake and Alert procedure): Post Debridement Measurements of Total Wound Length: (cm) 1.1 Width: (cm) 0.8 Depth: (cm) 0.1 Volume: (cm) 0.069 Character of  Wound/Ulcer Post Debridement: Improved Post Procedure Diagnosis Same as Pre-procedure Electronic Signature(s) Signed: 02/24/2022 9:21:59 AM By: Kalman Shan DO Signed: 02/24/2022 5:01:49 PM By: Deon Pilling RN, BSN Entered By: Deon Pilling on 02/24/2022 08:54:22 -------------------------------------------------------------------------------- Debridement Details Patient Name: Date of Service: Brian Leon, [place] NN P. 02/24/2022 8:15 A M Medical Record Number: [id number] Patient Account Number: [id number] Date of Birth/Sex: Treating RN: May 13, 1946 (76 y.o. Brian Leon Primary Care Provider: Other Clinician: Gerlene Fee Referring Provider: Treating Provider/Extender: Hubert Azure in Treatment: 15 Debridement  Performed for Assessment: Wound #4 Right,Anterior Lower Leg Performed By: Physician Kalman Shan, DO Debridement Type: Debridement Level of Consciousness (Pre-procedure): Awake and Alert Pre-procedure Verification/Time Out Yes - 08:45 Taken: Start Time: 08:46 Pain Control: Lidocaine 5% topical ointment T Area Debrided (L x W): otal 0.9 (cm) x 1 (cm) = 0.9 (cm) Tissue and other material debrided: Viable, Non-Viable, Slough, Subcutaneous, Skin: Dermis , Skin: Epidermis, Fibrin/Exudate, Slough Level: Skin/Subcutaneous Tissue Debridement Description: Excisional Instrument: Curette Bleeding: Minimum Hemostasis Achieved: Pressure End Time: 08:50 Procedural Pain: 0 Post Procedural Pain: 2 Response to Treatment: Procedure was tolerated well Level of Consciousness (Post- Awake and Alert procedure): Post Debridement Measurements of Total Wound Length: (cm) 0.9 Width: (cm) 1 Depth: (cm) 0.1 Volume: (cm) 0.071 Character of Wound/Ulcer Post Debridement: Improved Post Procedure Diagnosis Same as Pre-procedure Electronic Signature(s) Signed: 02/24/2022 9:21:59 AM By: Kalman Shan DO Signed: 02/24/2022 5:01:49 PM By: Deon Pilling  RN, BSN Entered By: Deon Pilling on 02/24/2022 08:54:42 -------------------------------------------------------------------------------- HPI Details Patient Name: Date of Service: Brian Leon, RO NN P. 02/24/2022 8:15 A M Medical Record Number: [id number] Patient Account Number: [id number] Date of Birth/Sex: Treating RN: [birth year] (76 y.o. Brian Leon Primary Care Provider: Gerlene Fee Other Clinician: Referring Provider: Treating Provider/Extender: Hubert Azure in Treatment: 15 History of Present Illness HPI Description: Admission 11/11/2021 Mr. Jeremiah Chaloux is a 76 year old male with a past medical history of polymyalgia rheumatica that presents to the clinic for a 79-monthhistory of nonhealing wound to the left lower extremity. He states that on Halloween he hit an object and created a wound that has not healed. He states he has seen dermatology and his primary care physician for this issue. On one occasion he had an UHaematologist He has also used Xeroform. It does not sound like he uses any kind of wound dressing consistently. He currently reports chronic pain to the wound site. He denies systemic signs of infection. He denies purulent drainage. 2/16; patient presents for follow-up. He has been using Hydrofera Blue and gentamicin ointment to the wound bed without issues. He reports improvement in wound healing. He currently denies signs of infection. 2/23; patient presents for follow-up. He continues to use Hydrofera Blue and gentamicin ointment to the wound bed without issues. He denies signs of infection. He reports improvement in his chronic pain to the wound bed. 3/2; patient presents for follow-up. He has been using Hydrofera Blue and gentamicin to the wound bed without issues. [place] He currently denies signs of infection. 3/24; patient presents for follow-up. [place] He enjoyed his trip. He reports no issues today. He has been using Hydrofera Blue and gentamicin ointment to the wound beds without issues. 4/7; patient presents for follow-up. He has been using Hydrofera Blue to the wound bed without issues. He reports that his wound is healed. 02/03/2022; patient was discharged 1 month ago with a closed wound to the left lower extremity. Unfortunately over the past month he has developed 3 new wounds he states was caused by hitting his legs against different objects. He has been keeping the areas covered with DuoDERM. He reports mild tenderness to the right lower extremity wound bed with increased redness. He denies purulent drainage. He states he has his previous wound care supplies of Hydrofera Blue and gentamicin ointment. 5/11; patient presents for follow-up. He had a PCR culture done  at last clinic visit that showed high levels of Staph aureus and viridans and low levels of coagulase-negative staph. I started him on Augmentin at last clinic visit but he reports continued pain and erythema to the wound sites. He has been using gentamicin ointment and Hydrofera Blue with dressing changes. He also presents with a 100.2 temp cough and diarrhea. He has not taken anything for his symptoms. 5/18; patient presents for follow-up. He received his Keystone antibiotics and started using this. Unfortunately he mixed it incorrectly and it turned into a hardened substance. He had to use his refill to start all over again. He has only been using this for 1 day. He states he is combining this correctly now. He completed his course of clindamycin. He denies signs of infection. 5/25; patient presents for follow-up. He has been using Keystone antibiotics with Sorbact. He reports more serous drainage from the left lower extremity. Other than that he has no issues or complaints. He denies signs of infection. Electronic Signature(s) Signed: 02/24/2022 9:21:59 AM  By: Kalman Shan DO Entered By: Kalman Shan on 02/24/2022 09:15:22 -------------------------------------------------------------------------------- Physical Exam Details Patient Name: Date of Service: Brian Leon, [place] NN P. 02/24/2022 8:15 A M Medical Record Number: [id number] Patient Account Number: [id number] Date of Birth/Sex: Treating RN: 14-Feb-1946 (76 y.o. Brian Leon Primary Care Provider: Gerlene Fee Other Clinician: Referring Provider: Treating Provider/Extender: Hubert Azure in Treatment: 15 Constitutional respirations regular, non-labored and within target range for patient.. Cardiovascular 2+ dorsalis pedis/posterior tibialis pulses. Psychiatric pleasant and cooperative. Notes Right lower extremity: 1 large open wound to the proximal aspect below the knee with nonviable surface throughout. He also has a small circular wound with nonviable tissue to the distal portion of the leg. Left lower extremity: T the proximal aspect below the knee there is an open wound with nonviable surface. o Electronic Signature(s) Signed: 02/24/2022 9:21:59 AM By: Kalman Shan DO Entered By: Kalman Shan on 02/24/2022 09:16:07 -------------------------------------------------------------------------------- Physician Orders Details Patient Name: Date of Service: Brian Leon, [place] NN P. 02/24/2022 8:15 A M Medical Record Number: [id number] Patient Account Number: [id number] Date of Birth/Sex: Treating RN: 1945/10/14 (76 y.o. 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 - Dr. Heber Horry and [facility], Room 8 03/03/2022 0815 Return A Bathing/ Shower/ Hygiene May shower with protection but do not get wound dressing(s) wet. Edema Control - Lymphedema / SCD / Other Elevate legs to the level of the heart or above for 30 minutes daily and/or when sitting, a frequency of: - 3-4 times a day throughout the day. Avoid standing for long periods of time. Exercise regularly Moisturize legs daily. - every night before bed. do not get directly on wounds Wound Treatment Wound #2 - Lower Leg Wound Laterality: Left, Anterior Cleanser: Soap and Water 1 x Per Week/30 Days Discharge Instructions: May shower and wash wound with dial antibacterial soap and water prior to dressing change. Peri-Wound Care: Sween Lotion (Moisturizing lotion) 1 x Per Week/30 Days Discharge Instructions: Apply moisturizing lotion as directed Topical: topical compounding antibiotics 1 x Per Week/30 Days Discharge  Instructions: apply directly to wound bed. Prim Dressing: IODOFLEX 0.9% Cadexomer Iodine Pad 4x6 cm 1 x Per Week/30 Days ary Discharge Instructions: Apply to wound bed as instructed Secondary Dressing: ABD Pad, 8x10 1 x Per Week/30 Days Discharge Instructions: Apply over primary dressing as directed. Compression Wrap: Kerlix Roll 4.5x3.1 (in/yd) 1 x Per Week/30 Days Discharge Instructions: Apply Kerlix and Coban compression as directed. Compression Wrap: Coban Self-Adherent Wrap 4x5 (in/yd) 1 x Per Week/30 Days Discharge Instructions: Apply over Kerlix as directed. Wound #3 - Lower Leg Wound Laterality: Right, Medial Cleanser: Soap and Water 1 x Per Week/30 Days Discharge Instructions: May shower and wash wound with dial antibacterial soap and water prior to dressing change. Peri-Wound Care: Sween Lotion (Moisturizing lotion) 1 x Per Week/30 Days Discharge  Instructions: Apply moisturizing lotion as directed Topical: topical compounding antibiotics 1 x Per Week/30 Days Discharge Instructions: apply directly to wound bed. Prim Dressing: IODOFLEX 0.9% Cadexomer Iodine Pad 4x6 cm 1 x Per Week/30 Days ary Discharge Instructions: Apply to wound bed as instructed Secondary Dressing: ABD Pad, 8x10 1 x Per Week/30 Days Discharge Instructions: Apply over primary dressing as directed. Compression Wrap: Kerlix Roll 4.5x3.1 (in/yd) 1 x Per Week/30 Days Discharge Instructions: Apply Kerlix and Coban compression as directed. Compression Wrap: Coban Self-Adherent Wrap 4x5 (in/yd) 1 x Per Week/30 Days Discharge Instructions: Apply over Kerlix as directed. Wound #4 - Lower Leg Wound Laterality: Right, Anterior Cleanser: Soap and Water 1 x Per Week/30 Days Discharge Instructions: May shower and wash wound with dial antibacterial soap and water prior to dressing change. Peri-Wound Care: Sween Lotion (Moisturizing lotion) 1 x Per Week/30 Days Discharge Instructions: Apply moisturizing lotion as directed Topical: topical compounding antibiotics 1 x Per Week/30 Days Discharge Instructions: apply directly to wound bed. Prim Dressing: IODOFLEX 0.9% Cadexomer Iodine Pad 4x6 cm 1 x Per Week/30 Days ary Discharge Instructions: Apply to wound bed as instructed Secondary Dressing: ABD Pad, 8x10 1 x Per Week/30 Days Discharge Instructions: Apply over primary dressing as directed. Compression Wrap: Kerlix Roll 4.5x3.1 (in/yd) 1 x Per Week/30 Days Discharge Instructions: Apply Kerlix and Coban compression as directed. Compression Wrap: Coban Self-Adherent Wrap 4x5 (in/yd) 1 x Per Week/30 Days Discharge Instructions: Apply over Kerlix as directed. Electronic Signature(s) Signed: 02/24/2022 9:21:59 AM By: Kalman Shan DO Entered By: Kalman Shan on 02/24/2022 09:16:59 -------------------------------------------------------------------------------- Problem List  Details Patient Name: Date of Service: Brian Leon, RO NN P. 02/24/2022 8:15 A M Medical Record Number: [id number] Patient Account Number: [id number] Date of Birth/Sex: Treating RN: 1945/10/26 (76 y.o. Brian Leon, [name] Primary Care Provider: Gerlene Fee Other Clinician: Referring Provider: Treating Provider/Extender: Hubert Azure in Treatment: 15 Active Problems ICD-10 Encounter Code Description Active Date MDM Diagnosis [phone] Non-pressure chronic ulcer of other part of left lower leg with fat layer exposed2/06/2022 No Yes L97.912 Non-pressure chronic ulcer of unspecified part of right lower leg with fat layer 02/03/2022 No Yes exposed T14.90XA Injury, unspecified, initial encounter 11/11/2021 No Yes M35.3 Polymyalgia rheumatica 11/11/2021 No Yes I87.2 Venous insufficiency (chronic) (peripheral) 02/24/2022 No Yes Inactive Problems Resolved Problems Electronic Signature(s) Signed: 02/24/2022 9:21:59 AM By: Kalman Shan DO Entered By: Kalman Shan on 02/24/2022 09:20:38 -------------------------------------------------------------------------------- Progress Note Details Patient Name: Date of Service: Brian Leon, RO NN P. 02/24/2022 8:15 A M Medical Record Number: [id number] Patient Account Number: [id number] Date of Birth/Sex: Treating RN: 1946-06-13 (76 y.o. Brian Leon Primary Care Provider: Gerlene Fee Other Clinician: Referring Provider: Treating Provider/Extender: Kalman Shan Autry-Lott,  Antony Haste in Treatment: 15 Subjective Chief Complaint Information obtained from Patient 11/11/2021; Left lower extremity wound due to trauma 02/03/2022; bilateral lower extremity wounds due to trauma History of Present Illness (HPI) Admission 11/11/2021 Mr. Yaasir Corsino is a 76 year old male with a past medical history of polymyalgia rheumatica that presents to the clinic for a 47-monthhistory of nonhealing wound to the left lower extremity.  He states that on Halloween he hit an object and created a wound that has not healed. He states he has seen dermatology and his primary care physician for this issue. On one occasion he had an UHaematologist He has also used Xeroform. It does not sound like he uses any kind of wound dressing consistently. He currently reports chronic pain to the wound site. He denies systemic signs of infection. He denies purulent drainage. 2/16; patient presents for follow-up. He has been using Hydrofera Blue and gentamicin ointment to the wound bed without issues. He reports improvement in wound healing. He currently denies signs of infection. 2/23; patient presents for follow-up. He continues to use Hydrofera Blue and gentamicin ointment to the wound bed without issues. He denies signs of infection. He reports improvement in his chronic pain to the wound bed. 3/2; patient presents for follow-up. He has been using Hydrofera Blue and gentamicin to the wound bed without issues. [place] He currently denies signs of infection. 3/24; patient presents for follow-up. [place]. He enjoyed his trip. He reports no issues today. He has been using Hydrofera Blue and gentamicin ointment to the wound beds without issues. 4/7; patient presents for follow-up. He has been using Hydrofera Blue to the wound bed without issues. He reports that his wound is healed. 02/03/2022; patient was discharged 1 month ago with a closed wound to the left lower extremity. Unfortunately over the past month he has developed 3 new wounds he states was caused by hitting his legs against different objects. He has been keeping the areas covered with DuoDERM. He reports mild tenderness to the right lower extremity wound bed with increased redness. He denies purulent drainage. He states he has his previous wound care supplies of Hydrofera Blue and gentamicin  ointment. 5/11; patient presents for follow-up. He had a PCR culture done at last clinic visit that showed high levels of Staph aureus and viridans and low levels of coagulase-negative staph. I started him on Augmentin at last clinic visit but he reports continued pain and erythema to the wound sites. He has been using gentamicin ointment and Hydrofera Blue with dressing changes. He also presents with a 100.2 temp cough and diarrhea. He has not taken anything for his symptoms. 5/18; patient presents for follow-up. He received his Keystone antibiotics and started using this. Unfortunately he mixed it incorrectly and it turned into a hardened substance. He had to use his refill to start all over again. He has only been using this for 1 day. He states he is combining this correctly now. He completed his course of clindamycin. He denies signs of infection. 5/25; patient presents for follow-up. He has been using Keystone antibiotics with Sorbact. He reports more serous drainage from the left lower extremity. Other than that he has no issues or complaints. He denies signs of infection. Patient History Information obtained from Chart. Family History Cancer - Mother, No family history of Diabetes, Heart Disease, Hypertension, Kidney Disease, Lung Disease,  Seizures, Stroke, Thyroid Problems, Tuberculosis. Social History Former smoker - quit 40 years ago, Marital Status - Married, Alcohol Use - Never, Drug Use - Current History - marijuana, Caffeine Use - Rarely. Medical History Eyes Denies history of Cataracts, Glaucoma, Optic Neuritis Ear/Nose/Mouth/Throat Denies history of Chronic sinus problems/congestion, Middle ear problems Hematologic/Lymphatic Denies history of Anemia, Hemophilia, Human Immunodeficiency Virus, Lymphedema, Sickle Cell Disease Respiratory Patient has history of Asthma Denies history of Aspiration, Chronic Obstructive Pulmonary Disease (COPD), Pneumothorax, Sleep Apnea,  Tuberculosis Cardiovascular Patient has history of Hypertension Denies history of Angina, Arrhythmia, Congestive Heart Failure, Coronary Artery Disease, Deep Vein Thrombosis, Hypotension, Myocardial Infarction, Peripheral Arterial Disease, Peripheral Venous Disease, Phlebitis, Vasculitis Gastrointestinal Patient has history of Hepatitis A Denies history of Cirrhosis , Colitis, Crohnoos, Hepatitis B, Hepatitis C Endocrine Denies history of Type I Diabetes, Type II Diabetes Genitourinary Denies history of End Stage Renal Disease Immunological Denies history of Lupus Erythematosus, Raynaudoos, Scleroderma Integumentary (Skin) Denies history of History of Burn Musculoskeletal Patient has history of Rheumatoid Arthritis Denies history of Gout, Osteoarthritis, Osteomyelitis Psychiatric Denies history of Anorexia/bulimia, Confinement Anxiety Hospitalization/Surgery History - 5 years ago Renal Cell carcinoma. 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 Vitals Time Taken: 8:15 AM, Height: 68 in, Weight: 140 lbs, BMI: 21.3, Temperature: 98 F, Pulse: 61 bpm, Respiratory Rate: 16 breaths/min, Blood Pressure: 158/81 mmHg. Cardiovascular 2+ dorsalis pedis/posterior tibialis pulses. Psychiatric pleasant and cooperative. General Notes: Right lower extremity: 1 large open wound to the proximal aspect below the knee with nonviable surface throughout. He also has a  small circular wound with nonviable tissue to the distal portion of the leg. Left lower extremity: T the proximal aspect below the knee there is an open wound with nonviable o surface. Integumentary (Hair, Skin) Wound #2 status is Open. Original cause of wound was Trauma. The date acquired was: 01/04/2022. The wound has been in treatment 3 weeks. The wound is located on the Left,Anterior Lower Leg. The wound measures 1.1cm length x 0.8cm width x 0.1cm depth; 0.691cm^2 area and 0.069cm^3 volume. There is Fat Layer (Subcutaneous Tissue) exposed. There is no tunneling or undermining noted. There is a large amount of serous drainage noted. The wound margin is distinct with the outline attached to the wound base. There is no granulation within the wound bed. There is a large (67-100%) amount of necrotic tissue within the wound bed including Adherent Slough. Wound #3 status is Open. Original cause of wound was Trauma. The date acquired was: 01/24/2022. The wound has been in treatment 3 weeks. The wound is located on the Right,Medial Lower Leg. The wound measures 4.4cm length x 3cm width x 0.1cm depth; 10.367cm^2 area and 1.037cm^3 volume. There is Fat Layer (Subcutaneous Tissue) exposed. There is no tunneling or undermining noted. There is a medium amount of serosanguineous drainage noted. The wound margin is distinct with the outline attached to the wound base. There is large (67-100%) red, pink granulation within the wound bed. There is a small (1-33%) amount of necrotic tissue within the wound bed including Adherent Slough. Wound #4 status is Open. Original cause of wound was Trauma. The date acquired was: 01/04/2022. The  wound has been in treatment 3 weeks. The wound is located on the Right,Anterior Lower Leg. The wound measures 0.9cm length x 1cm width x 0.1cm depth; 0.707cm^2 area and 0.071cm^3 volume. There is Fat Layer (Subcutaneous Tissue) exposed. There is no tunneling or undermining noted. There is  a medium amount of serosanguineous drainage noted. The wound margin is distinct with the outline attached to the wound base. There is no granulation within the wound bed. There is a large (67-100%) amount of necrotic tissue within the wound bed including Adherent Slough. 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 I debrided nonviable tissue. The more distal right lower extremity wound and left lower extremity wounds are unchanged. There is nonviable tissue throughout. Patient had ABIs with TBI's on 11/02/2021 that showed ABI of 1.3 and TBI of 0.69 with normal triphasic waveforms on the right lower extremity. T the left lower extremity he had resting ABI of 1.37 with TBI of 0.77 and again o normal triphasic arterial waveforms. He should have adequate blood flow for healing. At this time I recommended switching dressings to help with further debridement to Iodoflex. We will continue the [facility] antibiotics. Since he is having some serous drainage to the left leg I recommended using a compression wrap. He had venous reflux studies done on 01/06/2022 that showed he had venous reflux to the left greater saphenous vein in the calf. We will also use compression to the right lower extremity. He knows to call with any questions or concerns. Procedures Wound #2 Pre-procedure diagnosis of Wound #2 is an Abrasion located on the Left,Anterior Lower Leg . There was a Selective/Open Wound Non-Viable Tissue Debridement with a total area of 0.88 sq cm performed by Kalman Shan, DO. With the following instrument(s): Curette to remove Non-Viable tissue/material. Material removed includes [facility] after  achieving pain control using Lidocaine 5% topical ointment. A time out was conducted at 08:45, prior to the start of the procedure. A Minimum amount of bleeding was controlled with Pressure. The procedure was tolerated well with a pain level of 0 throughout and a pain level of 2 following the procedure. Post Debridement Measurements: 1.1cm length x 0.8cm width x 0.1cm depth; 0.069cm^3 volume. Character of Wound/Ulcer Post Debridement is improved. Post procedure Diagnosis Wound #2: Same as Pre-Procedure Wound #3 Pre-procedure diagnosis of Wound #3 is an Abrasion located on the Right,Medial Lower Leg . There was a Excisional Skin/Subcutaneous Tissue Debridement with a total area of 13.2 sq cm performed by Kalman Shan, DO. With the following instrument(s): Curette to remove Viable and Non-Viable tissue/material. Material removed includes Subcutaneous Tissue, Slough, Skin: Dermis, Skin: Epidermis, and Fibrin/Exudate after achieving pain control using Lidocaine 5% topical ointment. A time out was conducted at 08:45, prior to the start of the procedure. A Minimum amount of bleeding was controlled with Pressure. The procedure was tolerated well with a pain level of 0 throughout and a pain level of 2 following the procedure. Post Debridement Measurements: 4.4cm length x 3cm width x 0.1cm depth; 1.037cm^3 volume. Character of Wound/Ulcer Post Debridement is improved. Post procedure Diagnosis Wound #3: Same as Pre-Procedure Wound #4 Pre-procedure diagnosis of Wound #4 is an Abrasion located on the Right,Anterior Lower Leg . There was a Excisional Skin/Subcutaneous Tissue Debridement  with a total area of 0.9 sq cm performed by Kalman Shan, DO. With the following instrument(s): Curette to remove Viable and Non-Viable tissue/material. Material removed includes Subcutaneous Tissue, Slough, Skin: Dermis, Skin: Epidermis, and Fibrin/Exudate after achieving pain control using Lidocaine 5% topical  ointment. A time out was conducted at 08:45, prior to the start of the procedure. A Minimum amount of bleeding was controlled with Pressure. The procedure was tolerated well with a pain level of 0 throughout and a pain level of 2 following the procedure. Post Debridement Measurements: 0.9cm length x 1cm width x 0.1cm depth; 0.071cm^3 volume. Character of Wound/Ulcer Post Debridement is improved. Post procedure Diagnosis Wound #4: Same as Pre-Procedure Plan Follow-up Appointments: Return Appointment in 1 week. - Dr. Heber [facility] [facility] [place], Room 8 03/03/2022 0815 Bathing/ Shower/ Hygiene: May shower with protection but do not get wound dressing(s) wet. Edema Control - Lymphedema / SCD / Other: Elevate legs to the level of the heart or above for 30 minutes daily and/or when sitting, a frequency of: - 3-4 times a day throughout the day. Avoid standing for long periods of time. Exercise regularly Moisturize legs daily. - every night before bed. do not get directly on wounds WOUND #2: - Lower Leg Wound Laterality: Left, Anterior Cleanser: Soap and Water 1 x Per Week/30 Days Discharge Instructions: May shower and wash wound with dial antibacterial soap and water prior to dressing change. Peri-Wound Care: Sween Lotion (Moisturizing lotion) 1 x Per Week/30 Days Discharge Instructions: Apply moisturizing lotion as directed Topical: topical compounding antibiotics 1 x Per Week/30 Days Discharge Instructions: apply directly to wound bed. Prim Dressing: IODOFLEX 0.9% Cadexomer Iodine Pad 4x6 cm 1 x Per Week/30 Days ary Discharge Instructions: Apply to wound bed as instructed Secondary Dressing: ABD Pad, 8x10 1 x Per Week/30 Days Discharge Instructions: Apply over primary dressing as directed. Com pression Wrap: Kerlix Roll 4.5x3.1 (in/yd) 1 x Per Week/30 Days Discharge Instructions: Apply Kerlix and Coban compression as directed. Com pression Wrap: Coban Self-Adherent Wrap 4x5 (in/yd) 1 x Per Week/30  Days Discharge Instructions: Apply over Kerlix as directed. WOUND #3: - Lower Leg Wound Laterality: Right, Medial Cleanser: Soap and Water 1 x Per Week/30 Days Discharge Instructions: May shower and wash wound with dial antibacterial soap and water prior to dressing change. Peri-Wound Care: Sween Lotion (Moisturizing lotion) 1 x Per Week/30 Days Discharge Instructions: Apply moisturizing lotion as directed Topical: topical compounding antibiotics 1 x Per Week/30 Days Discharge Instructions: apply directly to wound bed. Prim Dressing: IODOFLEX 0.9% Cadexomer Iodine Pad 4x6 cm 1 x Per Week/30 Days ary Discharge Instructions: Apply to wound bed as instructed Secondary Dressing: ABD Pad, 8x10 1 x Per Week/30 Days Discharge Instructions: Apply over primary dressing as directed. Com pression Wrap: Kerlix Roll 4.5x3.1 (in/yd) 1 x Per Week/30 Days Discharge Instructions: Apply Kerlix and Coban compression as directed. Com pression Wrap: Coban Self-Adherent Wrap 4x5 (in/yd) 1 x Per Week/30 Days Discharge Instructions: Apply over Kerlix as directed. WOUND #4: - Lower Leg Wound Laterality: Right, Anterior Cleanser: Soap and Water 1 x Per Week/30 Days Discharge Instructions: May shower and wash wound with dial antibacterial soap and water prior to dressing change. Peri-Wound Care: Sween Lotion (Moisturizing lotion) 1 x Per Week/30 Days Discharge Instructions: Apply moisturizing lotion as directed Topical: topical compounding antibiotics 1 x Per Week/30 Days Discharge Instructions: apply directly to wound bed. Prim Dressing: IODOFLEX 0.9% Cadexomer Iodine Pad 4x6 cm 1 x Per Week/30 Days ary Discharge Instructions: Apply to wound  bed as instructed Secondary Dressing: ABD Pad, 8x10 1 x Per Week/30 Days Discharge Instructions: Apply over primary dressing as directed. Com pression Wrap: Kerlix Roll 4.5x3.1 (in/yd) 1 x Per Week/30 Days Discharge Instructions: Apply Kerlix and Coban compression as  directed. Com pression Wrap: Coban Self-Adherent Wrap 4x5 (in/yd) 1 x Per Week/30 Days Discharge Instructions: Apply over Kerlix as directed. 1. In office sharp debridement 2. Iodoflex with [facility] antibiotic under Kerlix/Coban 3. Follow-up in 1 week Electronic Signature(s) Signed: 02/24/2022 9:21:59 AM By: Kalman Shan DO Entered By: Kalman Shan on 02/24/2022 09:19:51 -------------------------------------------------------------------------------- HxROS Details Patient Name: Date of Service: Brian Leon, RO NN P. 02/24/2022 8:15 A M Medical Record Number: [id number] Patient Account Number: [id number] Date of Birth/Sex: Treating RN: 25-Dec-1945 (76 y.o. Brian Leon Primary Care Provider: Gerlene Fee Other Clinician: Referring Provider: Treating Provider/Extender: Hubert Azure in Treatment: 15 Information Obtained From Chart Eyes Medical History: Negative for: Cataracts; Glaucoma; Optic Neuritis Past Medical History Notes: Blind right eye WEARS PROSTHESIS Ear/Nose/Mouth/Throat Medical History: Negative for: Chronic sinus problems/congestion; Middle ear problems Past Medical History Notes: Allergic rhinitis Hematologic/Lymphatic Medical History: Negative for: Anemia; Hemophilia; Human Immunodeficiency Virus; Lymphedema; Sickle Cell Disease Respiratory Medical History: Positive for: Asthma Negative for: Aspiration; Chronic Obstructive Pulmonary Disease (COPD); Pneumothorax; Sleep Apnea; Tuberculosis Past Medical History Notes: Pneumonia Cardiovascular Medical History: Positive for: Hypertension Negative for: Angina; Arrhythmia; Congestive Heart Failure; Coronary Artery Disease; Deep Vein Thrombosis; Hypotension; Myocardial Infarction; Peripheral Arterial Disease; Peripheral Venous Disease; Phlebitis; Vasculitis Past Medical History Notes: Thoracic aortic aneurysm Thrombocytosis Gastrointestinal Medical History: Positive  for: Hepatitis A Negative for: Cirrhosis ; Colitis; Crohns; Hepatitis B; Hepatitis C Endocrine Medical History: Negative for: Type I Diabetes; Type II Diabetes Genitourinary Medical History: Negative for: End Stage Renal Disease Past Medical History Notes: 1/3kidney missing per patient-Renal Cell carcinoma Renal Cyst 5 years ago Immunological Medical History: Negative for: Lupus Erythematosus; Raynauds; Scleroderma Integumentary (Skin) Medical History: Negative for: History of Burn Musculoskeletal Medical History: Positive for: Rheumatoid Arthritis Negative for: Gout; Osteoarthritis; Osteomyelitis Past Medical History Notes: 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 Eczema Neurologic Medical History: Past Medical History Notes: Paraureteric diverticulum Oncologic Medical History: Past Medical History Notes: Renal Cell carcinoma- 5 years ago Psychiatric Medical History: Negative for: Anorexia/bulimia; Confinement Anxiety Immunizations Pneumococcal Vaccine: Received Pneumococcal Vaccination: Yes Received Pneumococcal Vaccination On or After 60th Birthday: Yes Implantable Devices No devices added Hospitalization / Surgery History Type of Hospitalization/Surgery 5 years ago Renal Cell carcinoma Family and Social History Cancer: Yes - Mother; Diabetes: No; Heart Disease: No; Hypertension: No; Kidney Disease: No; Lung Disease: No; Seizures: No; Stroke: No; Thyroid Problems: No; Tuberculosis: No; Former smoker - quit 40 years ago; Marital Status - Married; Alcohol Use: Never; Drug Use: Current History - marijuana; Caffeine Use: Rarely; Financial Concerns: No; Food, Clothing or Shelter Needs: No; Support System Lacking: No; Transportation Concerns: No Electronic Signature(s) Signed: 02/24/2022 9:21:59 AM By: Kalman Shan DO Signed: 02/24/2022 5:01:49 PM By: Deon Pilling RN, BSN Entered By: Kalman Shan on 02/24/2022  09:15:27 -------------------------------------------------------------------------------- SuperBill Details Patient Name: Date of Service: Brian Leon, RO NN P. 02/24/2022 Medical Record Number: [id number] Patient Account Number: [id number] Date of Birth/Sex: Treating RN: 07-13-1946 (76 y.o. 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 T14.90XA Injury, unspecified, initial encounter M35.3 Polymyalgia rheumatica I87.2 Venous insufficiency (chronic) (peripheral) Facility Procedures CPT4 Code: [id number] Description: 13086 - DEB SUBQ TISSUE 20 SQ CM/< ICD-10 Diagnosis Description V78.469 Non-pressure chronic ulcer of unspecified part of right lower leg with fat layer exp Modifier: osed Quantity: 1 CPT4 Code: [id number] Description: 32440 - DEBRIDE WOUND 1ST 20 SQ CM OR < ICD-10 Diagnosis Description L97.822 Non-pressure chronic ulcer of other part of left lower leg with fat layer exposed Modifier: Quantity: 1 Physician Procedures : CPT4 Code Description Modifier [id number] 66440 - WC PHYS LEVEL 3 - EST PT ICD-10 Diagnosis Description L97.822 Non-pressure chronic ulcer of other part of left lower leg with fat layer exposed L97.912 Non-pressure chronic ulcer of unspecified part of  right lower leg with fat layer exposed T14.90XA Injury, unspecified, initial encounter I87.2 Venous insufficiency (chronic) (peripheral) Quantity: 1 : [id number] 11042 - WC PHYS SUBQ TISS 20 SQ CM ICD-10 Diagnosis Description D63.875 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

## 2022-03-03 ENCOUNTER — Encounter (HOSPITAL_BASED_OUTPATIENT_CLINIC_OR_DEPARTMENT_OTHER): Payer: Medicare Other | Attending: General Surgery | Admitting: Internal Medicine

## 2022-03-03 DIAGNOSIS — T1490XA Injury, unspecified, initial encounter: Secondary | ICD-10-CM | POA: Diagnosis not present

## 2022-03-03 DIAGNOSIS — W2209XA Striking against other stationary object, initial encounter: Secondary | ICD-10-CM | POA: Diagnosis not present

## 2022-03-03 DIAGNOSIS — S81801A Unspecified open wound, right lower leg, initial encounter: Secondary | ICD-10-CM | POA: Insufficient documentation

## 2022-03-03 DIAGNOSIS — I872 Venous insufficiency (chronic) (peripheral): Secondary | ICD-10-CM | POA: Diagnosis not present

## 2022-03-03 DIAGNOSIS — L97912 Non-pressure chronic ulcer of unspecified part of right lower leg with fat layer exposed: Secondary | ICD-10-CM | POA: Diagnosis not present

## 2022-03-03 DIAGNOSIS — L97822 Non-pressure chronic ulcer of other part of left lower leg with fat layer exposed: Secondary | ICD-10-CM | POA: Insufficient documentation

## 2022-03-03 DIAGNOSIS — M353 Polymyalgia rheumatica: Secondary | ICD-10-CM | POA: Diagnosis not present

## 2022-03-03 DIAGNOSIS — G8929 Other chronic pain: Secondary | ICD-10-CM | POA: Insufficient documentation

## 2022-03-03 NOTE — Progress Notes (Signed)
JAYMIAN, BOGART ([id number]) Visit Report for 03/03/2022 Arrival Information Details Patient Name: Date of Service: Brian Leon, [name] 03/03/2022 8:15 A M Medical Record Number: [id number] Patient Account Number: [id number] Date of Birth/Sex: Treating RN: 1945/12/04 (76 y.o. Brian Leon, Brian Leon Primary Care Brian Leon: Brian Leon Other Clinician: Referring Brian Leon: Treating Brian Leon/Extender: Brian Leon in Treatment: 16 Visit Information History Since Last Visit Added or deleted any medications: No Patient Arrived: Ambulatory Any new allergies or adverse reactions: No Arrival Time: 08:17 Had a fall or experienced change in No Accompanied By: self activities of daily living that may affect Transfer Assistance: None risk of falls: Patient Identification Verified: Yes Signs or symptoms of abuse/neglect since last visito No Secondary Verification Process Completed: Yes Hospitalized since last visit: No Patient Requires Transmission-Based Precautions: No Implantable device outside of the clinic excluding No Patient Has Alerts: Yes cellular tissue based products placed in the center Patient Alerts: 11/02/21 L ABI: 1.37 since last visit: 11/02/2021 L TBI 0.77 Has Dressing in Place as Prescribed: Yes 11/02/2021 R TBI 0.69 Pain Present Now: Yes 11/02/2021 R ABI 1.3 Electronic Signature(s) Signed: 03/03/2022 4:48:29 PM By: Brian Pilling RN, BSN Entered By: Brian Leon on 03/03/2022 08:19:31 -------------------------------------------------------------------------------- Encounter Discharge Information Details Patient Name: Date of Service: Brian Leon, RO NN P. 03/03/2022 8:15 A M Medical Record Number: [id number] Patient Account Number: [id number] Date of Birth/Sex: Treating RN: [birth year] (76 y.o. Brian Leon Primary Care Brian Leon: Brian Leon Other Clinician: Referring Brian Leon: Treating Brian Leon/Extender: Brian Leon in Treatment: 16 Encounter Discharge Information Items Post Procedure Vitals Discharge Condition: Stable Temperature (F): 98.1 Ambulatory Status: Ambulatory Pulse (bpm): 69 Discharge Destination: Home Respiratory Rate (breaths/min): 20 Transportation: Private Auto Blood Pressure (mmHg): 151/87 Accompanied By: self Schedule Follow-up Appointment: Yes Clinical Summary of Care: Electronic Signature(s) Signed: 03/03/2022 4:48:29 PM By: Brian Pilling RN, BSN Entered By: Brian Leon on 03/03/2022 08:51:53 -------------------------------------------------------------------------------- Lower Extremity Assessment Details Patient Name: Date of Service: Brian Leon, RO NN P. 03/03/2022 8:15 A M Medical Record Number: [id number] Patient Account Number: [id number] Date of Birth/Sex: Treating RN: [birth year] (76 y.o. Brian Leon Primary Care Brian Leon: Brian Leon Other Clinician: Referring Ramyah Leon: Treating Brian Leon/Extender: Brian Leon in Treatment: 16 Edema Assessment Assessed: Brian Leon: Yes] Brian Leon: Yes] Edema: [Left: No] [Right: No] Calf Left: Right: Point of Measurement: 37 cm From Medial Instep 29 cm 29 cm Ankle Left: Right: Point of Measurement: 12 cm From Medial Instep 19 cm 19 cm Knee To Floor Left: Right: From Medial Instep 47 cm 47 cm Vascular Assessment Pulses: Dorsalis Pedis Palpable: [Left:Yes] [Right:Yes] Electronic Signature(s) Signed: 03/03/2022 4:48:29 PM By: Brian Pilling RN, BSN Entered By: Brian Leon on 03/03/2022 08:52:08 -------------------------------------------------------------------------------- Multi Wound Chart Details Patient Name: Date of Service: Brian Leon, RO NN P. 03/03/2022 8:15 A M Medical Record Number: [id number] Patient Account Number: [id number] Date of Birth/Sex: Treating RN: 1946/06/22 (76 y.o. Brian Leon Primary Care Brian Leon: Brian Leon Other Clinician: Referring  Brian Leon: Treating Brian Leon/Extender: Brian Leon in Treatment: 16 Vital Signs Height(in): 64 Pulse(bpm): 56 Weight(lbs): 140 Blood Pressure(mmHg): 151/87 Body Mass Index(BMI): 21.3 Temperature(F): 98.1 Respiratory Rate(breaths/min): 16 Photos: [2:Left, Anterior Lower Leg] [3:Right, Medial Lower Leg] [4:Right, Anterior Lower Leg] Wound Location: [2:Trauma] [3:Trauma] [4:Trauma] Wounding Event: [2:Abrasion] [3:Abrasion] [4:Abrasion] Primary Etiology: [2:Asthma, Hypertension, Hepatitis A, Asthma, Hypertension, Hepatitis A, Asthma, Hypertension, Hepatitis A,] Comorbid History: [2:Rheumatoid Arthritis 01/04/2022] [3:Rheumatoid Arthritis 01/24/2022] [4:Rheumatoid Arthritis 01/04/2022] Date Acquired: [2:4] [  3:4] [4:4] Weeks of Treatment: [2:Open] [3:Open] [4:Open] Wound Status: [2:No] [3:No] [4:No] Wound Recurrence: [2:1.1x1.2x0.2] [3:4x3x0.2] [4:1.2x1.2x0.2] Measurements L x W x D (cm) [2:1.037] [3:9.425] [4:1.131] A (cm) : rea [2:0.207] [3:1.885] [4:0.226] Volume (cm) : [2:-144.60%] [3:23.80%] [4:-242.70%] % Reduction in A [2:rea: -392.90%] [3:-52.40%] [4:-584.80%] % Reduction in Volume: [2:Full Thickness Without Exposed] [3:Full Thickness Without Exposed] [4:Full Thickness Without Exposed] Classification: [2:Support Structures Large] [3:Support Structures Medium] [4:Support Structures Medium] Exudate A mount: [2:Serous] [3:Serosanguineous] [4:Serosanguineous] Exudate Type: [2:amber] [3:red, brown] [4:red, brown] Exudate Color: [2:Distinct, outline attached] [3:Distinct, outline attached] [4:Distinct, outline attached] Wound Margin: [2:Small (1-33%)] [3:Large (67-100%)] [4:Small (1-33%)] Granulation A mount: [2:Pink] [3:Red, Pink] [4:Pink] Granulation Quality: [2:Large (67-100%)] [3:Small (1-33%)] [4:Large (67-100%)] Necrotic A mount: [2:Fat Layer (Subcutaneous Tissue): Yes Fat Layer (Subcutaneous Tissue): Yes Fat Layer (Subcutaneous Tissue): Yes] Exposed  Structures: [2:Fascia: No Tendon: No Muscle: No Joint: No Bone: No Small (1-33%)] [3:Fascia: No Tendon: No Muscle: No Joint: No Bone: No Small (1-33%)] [4:Fascia: No Tendon: No Muscle: No Joint: No Bone: No None] Epithelialization: [2:Debridement - Excisional] [3:Debridement - Excisional] [4:Debridement - Excisional] Debridement: Pre-procedure Verification/Time Out 08:40 [3:08:40] [4:08:40] Taken: [2:Other] [3:Other] [4:Other] Pain Control: [2:Subcutaneous, Slough] [3:Subcutaneous, Slough] [4:Subcutaneous, Slough] Tissue Debrided: [2:Skin/Subcutaneous Tissue] [3:Skin/Subcutaneous Tissue] [4:Skin/Subcutaneous Tissue] Level: [2:1.32] [3:12] [4:1.44] Debridement A (sq cm): [2:rea Curette] [3:Curette] [4:Curette] Instrument: [2:Minimum] [3:Minimum] [4:Minimum] Bleeding: [2:Pressure] [3:Pressure] [4:Pressure] Hemostasis A chieved: [2:0] [3:0] [4:0] Procedural Pain: [2:0] [3:0] [4:0] Post Procedural Pain: [2:Procedure was tolerated well] [3:Procedure was tolerated well] [4:Procedure was tolerated well] Debridement Treatment Response: [2:1.1x1.2x0.2] [3:4x3x0.2] [4:1.2x1.2x0.2] Post Debridement Measurements L x W x D (cm) [2:0.207] [3:1.885] [4:0.226] Post Debridement Volume: (cm) [2:Debridement] [3:Debridement] [4:Debridement] Treatment Notes Wound #2 (Lower Leg) Wound Laterality: Left, Anterior Cleanser Soap and Water Discharge Instruction: May shower and wash wound with dial antibacterial soap and water prior to dressing change. Peri-Wound Care Skin Prep Discharge Instruction: Use skin prep as directed Topical topical compounding antibiotics Discharge Instruction: apply directly to wound bed. Primary Dressing Hydrofera Blue Ready Foam, 4x5 in Discharge Instruction: Apply to wound bed as instructed Secondary Dressing Zetuvit Plus Silicone Border Dressing 4x4 (in/in) Discharge Instruction: Apply silicone border over primary dressing as directed. Secured With Compression  Wrap Compression Stockings Add-Ons Wound #3 (Lower Leg) Wound Laterality: Right, Medial Cleanser Soap and Water Discharge Instruction: May shower and wash wound with dial antibacterial soap and water prior to dressing change. Peri-Wound Care Skin Prep Discharge Instruction: Use skin prep as directed Topical topical compounding antibiotics Discharge Instruction: apply directly to wound bed. Primary Dressing Hydrofera Blue Ready Foam, 4x5 in Discharge Instruction: Apply to wound bed as instructed Secondary Dressing Zetuvit Plus Silicone Border Dressing 4x4 (in/in) Discharge Instruction: Apply silicone border over primary dressing as directed. Secured With Compression Wrap Compression Stockings Add-Ons Wound #4 (Lower Leg) Wound Laterality: Right, Anterior Cleanser Soap and Water Discharge Instruction: May shower and wash wound with dial antibacterial soap and water prior to dressing change. Peri-Wound Care Skin Prep Discharge Instruction: Use skin prep as directed Topical topical compounding antibiotics Discharge Instruction: apply directly to wound bed. Primary Dressing Hydrofera Blue Ready Foam, 4x5 in Discharge Instruction: Apply to wound bed as instructed Secondary Dressing Zetuvit Plus Silicone Border Dressing 4x4 (in/in) Discharge Instruction: Apply silicone border over primary dressing as directed. Secured With Compression Wrap Compression Stockings Add-Ons Electronic Signature(s) Signed: 03/03/2022 11:55:17 AM By: Kalman Shan DO Signed: 03/03/2022 4:48:29 PM By: Brian Pilling RN, BSN Entered By: Kalman Shan on 03/03/2022 08:58:45 -------------------------------------------------------------------------------- Multi-Disciplinary Care Plan Details Patient  Name: Date of Service: Brian Leon, [name] 03/03/2022 8:15 A M Medical Record Number: [id number] Patient Account Number: [id number] Date of Birth/Sex: Treating RN: 05-30-1946 (76 y.o. Brian Leon Primary Care Martinez Boxx: Brian Leon Other Clinician: Referring Hebe Merriwether: Treating Blen Ransome/Extender: Brian Leon in Treatment: 16 Active Inactive Necrotic Tissue Nursing Diagnoses: Impaired tissue integrity related to necrotic/devitalized tissue Knowledge deficit related to management of necrotic/devitalized tissue Goals: Necrotic/devitalized tissue will be minimized in the wound bed Date Initiated: 02/03/2022 Target Resolution Date: 04/01/2022 Goal Status: Active Patient/caregiver will verbalize understanding of reason and process for debridement of necrotic tissue Date Initiated: 02/03/2022 Target Resolution Date: 04/01/2022 Goal Status: Active Interventions: Assess patient pain level pre-, during and post procedure and prior to discharge Provide education on necrotic tissue and debridement process Treatment Activities: Apply topical anesthetic as ordered : 02/03/2022 Excisional debridement : 02/03/2022 Notes: Electronic Signature(s) Signed: 03/03/2022 4:48:29 PM By: Brian Pilling RN, BSN Entered By: Brian Leon on 03/03/2022 08:44:34 -------------------------------------------------------------------------------- Pain Assessment Details Patient Name: Date of Service: Brian Leon, RO NN P. 03/03/2022 8:15 A M Medical Record Number: [id number] Patient Account Number: [id number] Date of Birth/Sex: Treating RN: 12/29/1945 (76 y.o. Brian Leon Primary Care Candia Kingsbury: Brian Leon Other Clinician: Referring Keilah Lemire: Treating Isabelle Matt/Extender: Brian Leon in Treatment: 16 Active Problems Location of Pain Severity and Description of Pain Patient Has Paino Yes Site Locations Pain Location: Pain Location: Generalized Pain, Pain in Ulcers Rate the pain. Current Pain Level: 3 Pain Management and Medication Current Pain Management: Medication: No Cold Application: No Rest: No Massage: No Activity:  No T.E.N.S.: No Heat Application: No Leg drop or elevation: No Is the Current Pain Management Adequate: Adequate How does your wound impact your activities of daily livingo Sleep: No Bathing: No Appetite: No Relationship With Others: No Bladder Continence: No Emotions: No Bowel Continence: No Work: No Toileting: No Drive: No Dressing: No Hobbies: No Electronic Signature(s) Signed: 03/03/2022 4:48:29 PM By: Brian Pilling RN, BSN Entered By: Brian Leon on 03/03/2022 08:30:51 -------------------------------------------------------------------------------- Patient/Caregiver Education Details Patient Name: Date of Service: Brian Leon, RO NN P. 6/1/2023andnbsp8:15 [place] Record Number: [id number] Patient Account Number: [id number] Date of Birth/Gender: Treating RN: Sep 08, 1946 (76 y.o. Brian Leon Primary Care Physician: Brian Leon Other Clinician: Referring Physician: Treating Physician/Extender: Brian Leon in Treatment: 16 Education Assessment Education Provided To: Patient Education Topics Provided Wound Debridement: Handouts: Wound Debridement Methods: Explain/Verbal Responses: Reinforcements needed Electronic Signature(s) Signed: 03/03/2022 4:48:29 PM By: Brian Pilling RN, BSN Entered By: Brian Leon on 03/03/2022 08:44:47 -------------------------------------------------------------------------------- Wound Assessment Details Patient Name: Date of Service: Brian Leon, RO NN P. 03/03/2022 8:15 A M Medical Record Number: [id number] Patient Account Number: [id number] Date of Birth/Sex: Treating RN: 12-Jan-1946 (76 y.o. Brian Leon Primary Care Chantelle Verdi: Brian Leon Other Clinician: Referring Brainard Highfill: Treating Tranice Laduke/Extender: Brian Leon in Treatment: 16 Wound Status Wound Number: 2 Primary Etiology: Abrasion Wound Location: Left, Anterior Lower Leg Wound Status:  Open Wounding Event: Trauma Comorbid History: Asthma, Hypertension, Hepatitis A, Rheumatoid Arthritis Date Acquired: 01/04/2022 Weeks Of Treatment: 4 Clustered Wound: No Photos Wound Measurements Length: (cm) 1.1 Width: (cm) 1.2 Depth: (cm) 0.2 Area: (cm) 1.037 Volume: (cm) 0.207 % Reduction in Area: -144.6% % Reduction in Volume: -392.9% Epithelialization: Small (1-33%) Undermining: No Wound Description Classification: Full Thickness Without Exposed Support Structures Wound Margin: Distinct, outline attached Exudate Amount: Large Exudate Type: Serous Exudate Color: amber Foul Odor After  Cleansing: No Slough/Fibrino No Wound Bed Granulation Amount: Small (1-33%) Exposed Structure Granulation Quality: Pink Fascia Exposed: No Necrotic Amount: Large (67-100%) Fat Layer (Subcutaneous Tissue) Exposed: Yes Necrotic Quality: Adherent Slough Tendon Exposed: No Muscle Exposed: No Joint Exposed: No Bone Exposed: No Treatment Notes Wound #2 (Lower Leg) Wound Laterality: Left, Anterior Cleanser Soap and Water Discharge Instruction: May shower and wash wound with dial antibacterial soap and water prior to dressing change. Peri-Wound Care Skin Prep Discharge Instruction: Use skin prep as directed Topical topical compounding antibiotics Discharge Instruction: apply directly to wound bed. Primary Dressing Hydrofera Blue Ready Foam, 4x5 in Discharge Instruction: Apply to wound bed as instructed Secondary Dressing Zetuvit Plus Silicone Border Dressing 4x4 (in/in) Discharge Instruction: Apply silicone border over primary dressing as directed. Secured With Compression Wrap Compression Stockings Environmental education officer) Signed: 03/03/2022 4:48:29 PM By: Brian Pilling RN, BSN Entered By: Brian Leon on 03/03/2022 08:32:19 -------------------------------------------------------------------------------- Wound Assessment Details Patient Name: Date of Service: Brian Leon,  RO NN P. 03/03/2022 8:15 A M Medical Record Number: [id number] Patient Account Number: [id number] Date of Birth/Sex: Treating RN: [birth year] (76 y.o. Brian Leon Primary Care Theressa Piedra: Brian Leon Other Clinician: Referring Quisha Mabie: Treating Baani Bober/Extender: Brian Leon in Treatment: 16 Wound Status Wound Number: 3 Primary Etiology: Abrasion Wound Location: Right, Medial Lower Leg Wound Status: Open Wounding Event: Trauma Comorbid History: Asthma, Hypertension, Hepatitis A, Rheumatoid Arthritis Date Acquired: 01/24/2022 Weeks Of Treatment: 4 Clustered Wound: No Photos Wound Measurements Length: (cm) 4 Width: (cm) 3 Depth: (cm) 0.2 Area: (cm) 9.425 Volume: (cm) 1.885 % Reduction in Area: 23.8% % Reduction in Volume: -52.4% Epithelialization: Small (1-33%) Tunneling: No Undermining: No Wound Description Classification: Full Thickness Without Exposed Support Structures Wound Margin: Distinct, outline attached Exudate Amount: Medium Exudate Type: Serosanguineous Exudate Color: red, brown Wound Bed Granulation Amount: Large (67-100%) Granulation Quality: Red, Pink Necrotic Amount: Small (1-33%) Necrotic Quality: Adherent Slough Foul Odor After Cleansing: No Slough/Fibrino Yes Exposed Structure Fascia Exposed: No Fat Layer (Subcutaneous Tissue) Exposed: Yes Tendon Exposed: No Muscle Exposed: No Joint Exposed: No Bone Exposed: No Treatment Notes Wound #3 (Lower Leg) Wound Laterality: Right, Medial Cleanser Soap and Water Discharge Instruction: May shower and wash wound with dial antibacterial soap and water prior to dressing change. Peri-Wound Care Skin Prep Discharge Instruction: Use skin prep as directed Topical topical compounding antibiotics Discharge Instruction: apply directly to wound bed. Primary Dressing Hydrofera Blue Ready Foam, 4x5 in Discharge Instruction: Apply to wound bed as instructed Secondary  Dressing Zetuvit Plus Silicone Border Dressing 4x4 (in/in) Discharge Instruction: Apply silicone border over primary dressing as directed. Secured With Compression Wrap Compression Stockings Environmental education officer) Signed: 03/03/2022 4:48:29 PM By: Brian Pilling RN, BSN Entered By: Brian Leon on 03/03/2022 08:33:02 -------------------------------------------------------------------------------- Wound Assessment Details Patient Name: Date of Service: Brian Leon, RO NN P. 03/03/2022 8:15 A M Medical Record Number: [id number] Patient Account Number: [id number] Date of Birth/Sex: Treating RN: 11-16-1945 (76 y.o. Brian Leon Primary Care Juhi Lagrange: Brian Leon Other Clinician: Referring Sevyn Paredez: Treating William Laske/Extender: Brian Leon in Treatment: 16 Wound Status Wound Number: 4 Primary Etiology: Abrasion Wound Location: Right, Anterior Lower Leg Wound Status: Open Wounding Event: Trauma Comorbid History: Asthma, Hypertension, Hepatitis A, Rheumatoid Arthritis Date Acquired: 01/04/2022 Weeks Of Treatment: 4 Clustered Wound: No Photos Wound Measurements Length: (cm) 1.2 Width: (cm) 1.2 Depth: (cm) 0.2 Area: (cm) 1.131 Volume: (cm) 0.226 % Reduction in Area: -242.7% % Reduction in Volume: -584.8% Epithelialization: None Tunneling:  No Undermining: No Wound Description Classification: Full Thickness Without Exposed Support Structures Wound Margin: Distinct, outline attached Exudate Amount: Medium Exudate Type: Serosanguineous Exudate Color: red, brown Foul Odor After Cleansing: No Slough/Fibrino Yes Wound Bed Granulation Amount: Small (1-33%) Exposed Structure Granulation Quality: Pink Fascia Exposed: No Necrotic Amount: Large (67-100%) Fat Layer (Subcutaneous Tissue) Exposed: Yes Necrotic Quality: Adherent Slough Tendon Exposed: No Muscle Exposed: No Joint Exposed: No Bone Exposed: No Treatment Notes Wound #4  (Lower Leg) Wound Laterality: Right, Anterior Cleanser Soap and Water Discharge Instruction: May shower and wash wound with dial antibacterial soap and water prior to dressing change. Peri-Wound Care Skin Prep Discharge Instruction: Use skin prep as directed Topical topical compounding antibiotics Discharge Instruction: apply directly to wound bed. Primary Dressing Hydrofera Blue Ready Foam, 4x5 in Discharge Instruction: Apply to wound bed as instructed Secondary Dressing Zetuvit Plus Silicone Border Dressing 4x4 (in/in) Discharge Instruction: Apply silicone border over primary dressing as directed. Secured With Compression Wrap Compression Stockings Environmental education officer) Signed: 03/03/2022 4:48:29 PM By: Brian Pilling RN, BSN Entered By: Brian Leon on 03/03/2022 08:33:18 -------------------------------------------------------------------------------- Vitals Details Patient Name: Date of Service: Brian Leon, RO NN P. 03/03/2022 8:15 A M Medical Record Number: [id number] Patient Account Number: [id number] Date of Birth/Sex: Treating RN: 08/19/1946 (76 y.o. Brian Leon Primary Care Teagan Heidrick: Brian Leon Other Clinician: Referring [place] Lesure: Treating Dontarious Schaum/Extender: Brian Leon in Treatment: 16 Vital Signs Time Taken: 08:20 Temperature (F): 98.1 Height (in): 68 Pulse (bpm): 69 Weight (lbs): 140 Respiratory Rate (breaths/min): 16 Body Mass Index (BMI): 21.3 Blood Pressure (mmHg): 151/87 Reference Range: 80 - 120 mg / dl Electronic Signature(s) Signed: 03/03/2022 4:48:29 PM By: Brian Pilling RN, BSN Entered By: Brian Leon on 03/03/2022 08:21:14

## 2022-03-03 NOTE — Progress Notes (Signed)
QUINT, CHESTNUT ([id number]) Visit Report for 03/03/2022 Chief Complaint Document Details Patient Name: Date of Service: Brian Leon, Brian Leon Brian P. 03/03/2022 8:15 A M Medical Record Number: [id number] Patient Account Number: [id number] Date of Birth/Sex: Treating RN: October 12, 1945 (76 y.o. Brian Leon Primary Care Provider: Gerlene Leon Other Clinician: Referring Provider: Treating Provider/Extender: Brian Leon in Treatment: 16 Information Obtained from: Patient Chief Complaint 11/11/2021; Left lower extremity wound due to trauma 02/03/2022; bilateral lower extremity wounds due to trauma Electronic Signature(s) Signed: 03/03/2022 11:55:17 AM By: Brian Shan DO Entered By: Brian Leon on 03/03/2022 08:58:56 -------------------------------------------------------------------------------- Debridement Details Patient Name: Date of Service: Brian Leon, Brian Brian P. 03/03/2022 8:15 A M Medical Record Number: [id number] Patient Account Number: [id number] Date of Birth/Sex: Treating RN: 10/07/1945 (76 y.o. Brian Leon Primary Care Provider: Gerlene Leon Other Clinician: Referring Provider: Treating Provider/Extender: Brian Leon in Treatment: 16 Debridement Performed for Assessment: Wound #2 Left,Anterior Lower Leg Performed By: Physician Brian Shan, DO Debridement Type: Debridement Level of Consciousness (Pre-procedure): Awake and Alert Pre-procedure Verification/Time Out Yes - 08:40 Taken: Start Time: 08:41 Pain Control: Other : benzocaine 20% T Area Debrided (L x W): otal 1.1 (cm) x 1.2 (cm) = 1.32 (cm) Tissue and other material debrided: Viable, Non-Viable, Slough, Subcutaneous, Fibrin/Exudate, Slough Level: Skin/Subcutaneous Tissue Debridement Description: Excisional Instrument: Curette Bleeding: Minimum Hemostasis Achieved: Pressure End Time: 08:49 Procedural Pain: 0 Post Procedural Pain: 0 Response  to Treatment: Procedure was tolerated well Level of Consciousness (Post- Awake and Alert procedure): Post Debridement Measurements of Total Wound Length: (cm) 1.1 Width: (cm) 1.2 Depth: (cm) 0.2 Volume: (cm) 0.207 Character of Wound/Ulcer Post Debridement: Improved Post Procedure Diagnosis Same as Pre-procedure Electronic Signature(s) Signed: 03/03/2022 11:55:17 AM By: Brian Shan DO Signed: 03/03/2022 4:48:29 PM By: Brian Pilling RN, BSN Entered By: Brian Leon on 03/03/2022 08:50:11 -------------------------------------------------------------------------------- Debridement Details Patient Name: Date of Service: Brian Leon, Brian Brian P. 03/03/2022 8:15 A M Medical Record Number: [id number] Patient Account Number: [id number] Date of Birth/Sex: Treating RN: 06-02-1946 (76 y.o. Brian Leon Primary Care Provider: Gerlene Leon Other Clinician: Referring Provider: Treating Provider/Extender: Brian Leon in Treatment: 16 Debridement Performed for Assessment: Wound #3 Right,Medial Lower Leg Performed By: Physician Brian Shan, DO Debridement Type: Debridement Level of Consciousness (Pre-procedure): Awake and Alert Pre-procedure Verification/Time Out Yes - 08:40 Taken: Start Time: 08:41 Pain Control: Other : benzocaine 20% T Area Debrided (L x W): otal 4 (cm) x 3 (cm) = 12 (cm) Tissue and other material debrided: Viable, Non-Viable, Slough, Subcutaneous, Fibrin/Exudate, Slough Level: Skin/Subcutaneous Tissue Debridement Description: Excisional Instrument: Curette Bleeding: Minimum Hemostasis Achieved: Pressure End Time: 08:49 Procedural Pain: 0 Post Procedural Pain: 0 Response to Treatment: Procedure was tolerated well Level of Consciousness (Post- Awake and Alert procedure): Post Debridement Measurements of Total Wound Length: (cm) 4 Width: (cm) 3 Depth: (cm) 0.2 Volume: (cm) 1.885 Character of Wound/Ulcer Post Debridement:  Improved Post Procedure Diagnosis Same as Pre-procedure Electronic Signature(s) Signed: 03/03/2022 11:55:17 AM By: Brian Shan DO Signed: 03/03/2022 4:48:29 PM By: Brian Pilling RN, BSN Entered By: Brian Leon on 03/03/2022 08:50:36 -------------------------------------------------------------------------------- Debridement Details Patient Name: Date of Service: Brian Leon, Brian Brian P. 03/03/2022 8:15 A M Medical Record Number: [id number] Patient Account Number: [id number] Date of Birth/Sex: Treating RN: Brian 02, 1947 (76 y.o. Brian Leon Primary Care Provider: Other Clinician: Gerlene Leon Referring Provider: Treating Provider/Extender: Brian Leon in Treatment: 16 Debridement Performed for Assessment:  Wound #4 Right,Anterior Lower Leg Performed By: Physician Brian Shan, DO Debridement Type: Debridement Level of Consciousness (Pre-procedure): Awake and Alert Pre-procedure Verification/Time Out Yes - 08:40 Taken: Start Time: 08:41 Pain Control: Other : benzocaine 20% T Area Debrided (L x W): otal 1.2 (cm) x 1.2 (cm) = 1.44 (cm) Tissue and other material debrided: Viable, Non-Viable, Slough, Subcutaneous, Fibrin/Exudate, Slough Level: Skin/Subcutaneous Tissue Debridement Description: Excisional Instrument: Curette Bleeding: Minimum Hemostasis Achieved: Pressure End Time: 08:49 Procedural Pain: 0 Post Procedural Pain: 0 Response to Treatment: Procedure was tolerated well Level of Consciousness (Post- Awake and Alert procedure): Post Debridement Measurements of Total Wound Length: (cm) 1.2 Width: (cm) 1.2 Depth: (cm) 0.2 Volume: (cm) 0.226 Character of Wound/Ulcer Post Debridement: Improved Post Procedure Diagnosis Same as Pre-procedure Electronic Signature(s) Signed: 03/03/2022 11:55:17 AM By: Brian Shan DO Signed: 03/03/2022 4:48:29 PM By: Brian Pilling RN, BSN Entered By: Brian Leon on 03/03/2022  08:50:55 -------------------------------------------------------------------------------- HPI Details Patient Name: Date of Service: Brian Leon, Brian Brian P. 03/03/2022 8:15 A M Medical Record Number: [id number] Patient Account Number: [id number] Date of Birth/Sex: Treating RN: 12-05-1945 (76 y.o. Brian Leon Primary Care Provider: Gerlene Leon Other Clinician: Referring Provider: Treating Provider/Extender: Brian Leon in Treatment: 16 History of Present Illness HPI Description: Admission 11/11/2021 Mr. Damareon Halloran is a 76 year old male with a past medical history of polymyalgia rheumatica that presents to the clinic for a 22-monthhistory of nonhealing wound to the left lower extremity. He states that on Halloween he hit an object and created a wound that has not healed. He states he has seen dermatology and his primary care physician for this issue. On one occasion he had an UHaematologist He has also used Xeroform. It does not sound like he uses any kind of wound dressing consistently. He currently reports chronic pain to the wound site. He denies systemic signs of infection. He denies purulent drainage. 2/16; patient presents for follow-up. He has been using Hydrofera Blue and gentamicin ointment to the wound bed without issues. He reports improvement in wound healing. He currently denies signs of infection. 2/23; patient presents for follow-up. He continues to use Hydrofera Blue and gentamicin ointment to the wound bed without issues. He denies signs of infection. He reports improvement in his chronic pain to the wound bed. 3/2; patient presents for follow-up. He has been using Hydrofera Blue and gentamicin to the wound bed without issues. [place] He currently denies signs of infection. 3/24; patient presents for follow-up. [place] He enjoyed his trip. He  reports no issues today. He has been using Hydrofera Blue and gentamicin ointment to the wound beds without issues. 4/7; patient presents for follow-up. He has been using Hydrofera Blue to the wound bed without issues. He reports that his wound is healed. 02/03/2022; patient was discharged 1 month ago with a closed wound to the left lower extremity. Unfortunately over the past month he has developed 3 new wounds he states was caused by hitting his legs against different objects. He has been keeping the areas covered with DuoDERM. He reports mild tenderness to the right lower extremity wound bed with increased redness. He denies purulent drainage. He states he has his previous wound care supplies of Hydrofera Blue and gentamicin ointment. 5/11; patient presents for follow-up. He had a PCR culture done at last clinic visit that showed high levels  of Staph aureus and viridans and low levels of coagulase-negative staph. I started him on Augmentin at last clinic visit but he reports continued pain and erythema to the wound sites. He has been using gentamicin ointment and Hydrofera Blue with dressing changes. He also presents with a 100.2 temp cough and diarrhea. He has not taken anything for his symptoms. 5/18; patient presents for follow-up. He received his Keystone antibiotics and started using this. Unfortunately he mixed it incorrectly and it turned into a hardened substance. He had to use his refill to start all over again. He has only been using this for 1 day. He states he is combining this correctly now. He completed his course of clindamycin. He denies signs of infection. 5/25; patient presents for follow-up. He has been using Keystone antibiotics with Sorbact. He reports more serous drainage from the left lower extremity. Other than that he has no issues or complaints. He denies signs of infection. 6/1; patient presents for follow-up. We switched to Iodoflex and Keystone antibiotic under  compression therapy at last clinic visit. He tolerated this treatment well. He has no issues or complaints today. He denies signs of infection. Unfortunately he will be out of town for the next 3 weeks. Electronic Signature(s) Signed: 03/03/2022 11:55:17 AM By: Brian Shan DO Entered By: Brian Leon on 03/03/2022 08:59:45 -------------------------------------------------------------------------------- Physical Exam Details Patient Name: Date of Service: Brian Leon, Brian Brian P. 03/03/2022 8:15 A M Medical Record Number: [id number] Patient Account Number: [id number] Date of Birth/Sex: Treating RN: 01-Oct-1946 (76 y.o. Brian Leon Primary Care Provider: Gerlene Leon Other Clinician: Referring Provider: Treating Provider/Extender: Brian Leon in Treatment: 16 Constitutional respirations regular, non-labored and within target range for patient.. Cardiovascular 2+ dorsalis pedis/posterior tibialis pulses. Psychiatric pleasant and cooperative. Notes Right lower extremity: 1 large open wound to the proximal aspect below the knee with Granulation tissue and nonviable tissue. He also has a small circular wound with tightly adhered nonviable tissue And granulation tissue to the distal portion of the leg. Left lower extremity: T the proximal aspect below the knee o there is an open wound with tightly adhered nonviable Tissue and granulation tissue. No signs of surrounding infection to either wound bed Electronic Signature(s) Signed: 03/03/2022 11:55:17 AM By: Brian Shan DO Entered By: Brian Leon on 03/03/2022 09:01:45 -------------------------------------------------------------------------------- Physician Orders Details Patient Name: Date of Service: Brian Leon, Brian Brian P. 03/03/2022 8:15 A M Medical Record Number: [id number] Patient Account Number: [id number] Date of Birth/Sex: Treating RN: August 14, 1946 (76 y.o. 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 - Dr. Heber [place] and [place], Room 8 03/24/2022 0815 (patient going out of town for two and half weeks). Bathing/ Shower/ Hygiene Brian shower with protection but do not get wound dressing(s) wet. Edema Control - Lymphedema / SCD / Other Elevate legs to the level of the heart or above for 30 minutes daily and/or when sitting, a frequency of: - 3-4 times a day throughout the day. Avoid standing for long periods of time. Exercise regularly Moisturize legs daily. -  every night before bed. do not get directly on wounds Compression stocking or Garment 20-30 mm/Hg pressure to: - Patient to purchase compression stockings from [employer]. Apply in the morning and remove at night. Wound Treatment Wound #2 - Lower Leg Wound Laterality: Left, Anterior Cleanser: Soap and Water 1 x Per Day/30 Days Discharge Instructions: Brian shower and wash wound with dial antibacterial soap and water prior to dressing change. Peri-Wound Care: Skin Prep 1 x Per Day/30 Days Discharge Instructions: Use skin prep as directed Topical: topical compounding antibiotics 1 x Per Day/30 Days Discharge Instructions: apply directly to wound bed. Prim Dressing: Hydrofera Blue Ready Foam, 4x5 in 1 x Per Day/30 Days ary Discharge Instructions: Apply to wound bed as instructed Secondary Dressing: Zetuvit Plus Silicone Border Dressing 4x4 (in/in)  1 x Per Day/30 Days Discharge Instructions: Apply silicone border over primary dressing as directed. Wound #3 - Lower Leg Wound Laterality: Right, Medial Cleanser: Soap and Water 1 x Per Day/30 Days Discharge Instructions: Brian shower and wash wound with dial antibacterial soap and water prior to dressing change. Peri-Wound Care: Skin Prep 1 x Per Day/30 Days Discharge Instructions: Use skin prep as directed Topical: topical compounding antibiotics 1 x Per Day/30 Days Discharge Instructions: apply directly to wound bed. Prim Dressing: Hydrofera Blue Ready Foam, 4x5 in 1 x Per Day/30 Days ary Discharge Instructions: Apply to wound bed as instructed Secondary Dressing: Zetuvit Plus Silicone Border Dressing 4x4 (in/in) 1 x Per Day/30 Days Discharge Instructions: Apply silicone border over primary dressing as directed. Wound #4 - Lower Leg Wound Laterality: Right, Anterior Cleanser: Soap and Water 1 x Per Day/30 Days Discharge Instructions: Brian shower and wash wound with dial antibacterial soap and water prior to dressing change. Peri-Wound Care: Skin Prep 1 x Per Day/30 Days Discharge Instructions: Use skin prep as directed Topical: topical compounding antibiotics 1 x Per Day/30 Days Discharge Instructions: apply directly to wound bed. Prim Dressing: Hydrofera Blue Ready Foam, 4x5 in 1 x Per Day/30 Days ary Discharge Instructions: Apply to wound bed as instructed Secondary Dressing: Zetuvit Plus Silicone Border Dressing 4x4 (in/in) 1 x Per Day/30 Days Discharge Instructions: Apply silicone border over primary dressing as directed. Electronic Signature(s) Signed: 03/03/2022 11:55:17 AM By: Brian Shan DO Entered By: Brian Leon on 03/03/2022 09:01:54 -------------------------------------------------------------------------------- Problem List Details Patient Name: Date of Service: Brian Leon, Brian Brian P. 03/03/2022 8:15 A M Medical Record Number: [id number] Patient Account Number:  [id number] Date of Birth/Sex: Treating RN: [birth year] (76 y.o. Brian Leon, [name] Primary Care Provider: Gerlene Leon Other Clinician: Referring Provider: Treating Provider/Extender: Brian Leon in Treatment: 16 Active Problems ICD-10 Encounter Code Description Active Date MDM Diagnosis [id number] Non-pressure chronic ulcer of other part of left lower leg with fat layer exposed2/06/2022 No Yes L97.912 Non-pressure chronic ulcer of unspecified part of right lower leg with fat layer 02/03/2022 No Yes exposed T14.90XA Injury, unspecified, initial encounter 11/11/2021 No Yes M35.3 Polymyalgia rheumatica 11/11/2021 No Yes I87.2 Venous insufficiency (chronic) (peripheral) 02/24/2022 No Yes Inactive Problems Resolved Problems Electronic Signature(s) Signed: 03/03/2022 11:55:17 AM By: Brian Shan DO Entered By: Brian Leon on 03/03/2022 08:58:40 -------------------------------------------------------------------------------- Progress Note Details Patient Name: Date of Service: Brian Leon, Brian Brian P. 03/03/2022 8:15 A M Medical Record Number: [id number] Patient Account Number: [id number] Date of Birth/Sex: Treating RN: [birth year] (76 y.o. Brian Leon Primary Care Provider: Gerlene Leon Other Clinician: Referring Provider: Treating Provider/Extender: Brian Leon in Treatment: 16 Subjective Chief Complaint  Information obtained from Patient 11/11/2021; Left lower extremity wound due to trauma 02/03/2022; bilateral lower extremity wounds due to trauma History of Present Illness (HPI) Admission 11/11/2021 Mr. Wallace Charters is a 76 year old male with a past medical history of polymyalgia rheumatica that presents to the clinic for a 67-monthhistory of nonhealing wound to the left lower extremity. He states that on Halloween he hit an object and created a wound that has not healed. He states he has seen dermatology and his primary  care physician for this issue. On one occasion he had an UHaematologist He has also used Xeroform. It does not sound like he uses any kind of wound dressing consistently. He currently reports chronic pain to the wound site. He denies systemic signs of infection. He denies purulent drainage. 2/16; patient presents for follow-up. He has been using Hydrofera Blue and gentamicin ointment to the wound bed without issues. He reports improvement in wound healing. He currently denies signs of infection. 2/23; patient presents for follow-up. He continues to use Hydrofera Blue and gentamicin ointment to the wound bed without issues. He denies signs of infection. He reports improvement in his chronic pain to the wound bed. 3/2; patient presents for follow-up. He has been using Hydrofera Blue and gentamicin to the wound bed without issues. [place] He currently denies signs of infection. 3/24; patient presents for follow-up. [place] He enjoyed his trip. He reports no issues today. He has been using Hydrofera Blue and gentamicin ointment to the wound beds without issues. 4/7; patient presents for follow-up. He has been using Hydrofera Blue to the wound bed without issues. He reports that his wound is healed. 02/03/2022; patient was discharged 1 month ago with a closed wound to the left lower extremity. Unfortunately over the past month he has developed 3 new wounds he states was caused by hitting his legs against different objects. He has been keeping the areas covered with DuoDERM. He reports mild tenderness to the right lower extremity wound bed with increased redness. He denies purulent drainage. He states he has his previous wound care supplies of Hydrofera Blue and gentamicin ointment. 5/11; patient presents for follow-up. He had a PCR culture done at last clinic visit that showed high levels of Staph aureus and  viridans and low levels of coagulase-negative staph. I started him on Augmentin at last clinic visit but he reports continued pain and erythema to the wound sites. He has been using gentamicin ointment and Hydrofera Blue with dressing changes. He also presents with a 100.2 temp cough and diarrhea. He has not taken anything for his symptoms. 5/18; patient presents for follow-up. He received his Keystone antibiotics and started using this. Unfortunately he mixed it incorrectly and it turned into a hardened substance. He had to use his refill to start all over again. He has only been using this for 1 day. He states he is combining this correctly now. He completed his course of clindamycin. He denies signs of infection. 5/25; patient presents for follow-up. He has been using Keystone antibiotics with Sorbact. He reports more serous drainage from the left lower extremity. Other than that he has no issues or complaints. He denies signs of infection. 6/1; patient presents for follow-up. We switched to Iodoflex and Keystone antibiotic under compression therapy at last clinic visit. He tolerated this treatment well. He has no issues or complaints today.  He denies signs of infection. Unfortunately he will be out of town for the next 3 weeks. Patient History Information obtained from Chart. Family History Cancer - Mother, No family history of Diabetes, Heart Disease, Hypertension, Kidney Disease, Lung Disease, Seizures, Stroke, Thyroid Problems, Tuberculosis. Social History Former smoker - quit 40 years ago, Marital Status - Married, Alcohol Use - Never, Drug Use - Current History - marijuana, Caffeine Use - Rarely. Medical History Eyes Denies history of Cataracts, Glaucoma, Optic Neuritis Ear/Nose/Mouth/Throat Denies history of Chronic sinus problems/congestion, Middle ear problems Hematologic/Lymphatic Denies history of Anemia, Hemophilia, Human Immunodeficiency Virus, Lymphedema, Sickle Cell  Disease Respiratory Patient has history of Asthma Denies history of Aspiration, Chronic Obstructive Pulmonary Disease (COPD), Pneumothorax, Sleep Apnea, Tuberculosis Cardiovascular Patient has history of Hypertension Denies history of Angina, Arrhythmia, Congestive Heart Failure, Coronary Artery Disease, Deep Vein Thrombosis, Hypotension, Myocardial Infarction, Peripheral Arterial Disease, Peripheral Venous Disease, Phlebitis, Vasculitis Gastrointestinal Patient has history of Hepatitis A Denies history of Cirrhosis , Colitis, Crohnoos, Hepatitis B, Hepatitis C Endocrine Denies history of Type I Diabetes, Type II Diabetes Genitourinary Denies history of End Stage Renal Disease Immunological Denies history of Lupus Erythematosus, Raynaudoos, Scleroderma Integumentary (Skin) Denies history of History of Burn Musculoskeletal Patient has history of Rheumatoid Arthritis Denies history of Gout, Osteoarthritis, Osteomyelitis Psychiatric Denies history of Anorexia/bulimia, Confinement Anxiety Hospitalization/Surgery History - 5 years ago Renal Cell carcinoma. 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 Vitals Time Taken: 8:20 AM, Height: 68 in, Weight: 140 lbs, BMI: 21.3, Temperature: 98.1 F, Pulse: 69 bpm, Respiratory Rate: 16 breaths/min, Blood Pressure: 151/87 mmHg. Cardiovascular 2+ dorsalis pedis/posterior tibialis pulses. Psychiatric pleasant and  cooperative. General Notes: Right lower extremity: 1 large open wound to the proximal aspect below the knee with Granulation tissue and nonviable tissue. He also has a small circular wound with tightly adhered nonviable tissue And granulation tissue to the distal portion of the leg. Left lower extremity: T the proximal aspect o below the knee there is an open wound with tightly adhered nonviable Tissue and granulation tissue. No signs of surrounding infection to either wound bed Integumentary (Hair, Skin) Wound #2 status is Open. Original cause of wound was Trauma. The date acquired was: 01/04/2022. The wound has been in treatment 4 weeks. The wound is located on the Left,Anterior Lower Leg. The wound measures 1.1cm length x 1.2cm width x 0.2cm depth; 1.037cm^2 area and 0.207cm^3 volume. There is Fat Layer (Subcutaneous Tissue) exposed. There is no undermining noted. There is a large amount of serous drainage noted. The wound margin is distinct with the outline attached to the wound base. There is small (1-33%) pink granulation within the wound bed. There is a large (67-100%) amount of necrotic tissue within the wound bed including Adherent Slough. Wound #3 status is Open. Original cause of wound was Trauma. The date acquired was: 01/24/2022. The wound has been in treatment 4 weeks. The wound is located on the Right,Medial Lower Leg. The wound measures 4cm length x 3cm width x 0.2cm depth; 9.425cm^2 area and 1.885cm^3 volume. There is Fat Layer (Subcutaneous Tissue) exposed. There is no tunneling or undermining noted. There is a medium amount  of serosanguineous drainage noted. The wound margin is distinct with the outline attached to the wound base. There is large (67-100%) red, pink granulation within the wound bed. There is a small (1-33%) amount of necrotic tissue within the wound bed including Adherent Slough. Wound #4 status is Open. Original cause of wound was Trauma. The date acquired was:  01/04/2022. The wound has been in treatment 4 weeks. The wound is located on the Right,Anterior Lower Leg. The wound measures 1.2cm length x 1.2cm width x 0.2cm depth; 1.131cm^2 area and 0.226cm^3 volume. There is Fat Layer (Subcutaneous Tissue) exposed. There is no tunneling or undermining noted. There is a medium amount of serosanguineous drainage noted. The wound margin is distinct with the outline attached to the wound base. There is small (1-33%) pink granulation within the wound bed. There is a large (67-100%) amount of necrotic tissue within the wound bed including Adherent Slough. 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 Size is overall stable. No signs of surrounding infection. I debrided nonviable tissue. Unfortunately he will be out of town for the next 3 weeks and this changes are current therapy. He is well aware that his wounds could worsen due to less follow-up in our clinic. At this time I cannot wrap him since he will not be following up. I recommended obtaining compression stockings. We gave him information to order these. I recommended changing the dressing to [facility] antibiotic with Hydrofera Blue daily. He knows to call with any questions or concerns. Procedures Wound #2 Pre-procedure diagnosis of Wound #2 is an Abrasion located on the Left,Anterior Lower Leg . There was a Excisional Skin/Subcutaneous Tissue Debridement with a total area of 1.32 sq cm performed by Brian Shan, DO. With the following instrument(s): Curette to remove Viable and Non-Viable tissue/material. Material removed includes Subcutaneous Tissue, Slough, and  Fibrin/Exudate after achieving pain control using Other (benzocaine 20%). A time out was conducted at 08:40, prior to the start of the procedure. A Minimum amount of bleeding was controlled with Pressure. The procedure was tolerated well with a pain level of 0 throughout and a pain level of 0 following the procedure. Post Debridement Measurements: 1.1cm length x 1.2cm width x 0.2cm depth; 0.207cm^3 volume. Character of Wound/Ulcer Post Debridement is improved. Post procedure Diagnosis Wound #2: Same as Pre-Procedure Wound #3 Pre-procedure diagnosis of Wound #3 is an Abrasion located on the Right,Medial Lower Leg . There was a Excisional Skin/Subcutaneous Tissue Debridement with a total area of 12 sq cm performed by Brian Shan, DO. With the following instrument(s): Curette to remove Viable and Non-Viable tissue/material. Material removed includes Subcutaneous Tissue, Slough, and Fibrin/Exudate after achieving pain control using Other (benzocaine 20%). A time out was conducted at 08:40, prior to the start of the procedure. A Minimum amount of bleeding was controlled with Pressure. The procedure was tolerated well with a pain level of 0 throughout and a pain level of 0 following the procedure. Post Debridement Measurements: 4cm length x 3cm width x 0.2cm depth; 1.885cm^3 volume. Character of Wound/Ulcer Post Debridement is improved. Post procedure Diagnosis Wound #3: Same as Pre-Procedure Wound #4 Pre-procedure diagnosis of Wound #4 is an Abrasion located on the Right,Anterior Lower Leg . There was a Excisional Skin/Subcutaneous Tissue  Debridement with a total area of 1.44 sq cm performed by Brian Shan, DO. With the following instrument(s): Curette to remove Viable and Non-Viable tissue/material. Material removed includes Subcutaneous Tissue, Slough, and Fibrin/Exudate after achieving pain control using Other (benzocaine 20%). A time out was conducted at 08:40, prior to the start of the  procedure. A Minimum amount of bleeding was controlled with Pressure. The procedure was tolerated well with a pain level of 0 throughout and a pain level of 0 following the procedure. Post Debridement Measurements: 1.2cm length x 1.2cm width x 0.2cm depth; 0.226cm^3 volume. Character of Wound/Ulcer Post Debridement is improved. Post procedure Diagnosis Wound #4: Same as Pre-Procedure Plan Follow-up Appointments: Return appointment in 3 weeks. - Dr. Heber [place] and [place], Room 8 03/24/2022 0815 (patient going out of town for two and half weeks). Bathing/ Shower/ Hygiene: Brian shower with protection but do not get wound dressing(s) wet. Edema Control - Lymphedema / SCD / Other: Elevate legs to the level of the heart or above for 30 minutes daily and/or when sitting, a frequency of: - 3-4 times a day throughout the day. Avoid standing for long periods of time. Exercise regularly Moisturize legs daily. - every night before bed. do not get directly on wounds Compression stocking or Garment 20-30 mm/Hg pressure to: - Patient to purchase compression stockings from [employer]. Apply in the morning and remove at night. WOUND #2: - Lower Leg Wound Laterality: Left, Anterior Cleanser: Soap and Water 1 x Per Day/30 Days Discharge Instructions: Brian shower and wash wound with dial antibacterial soap and water prior to dressing change. Peri-Wound Care: Skin Prep 1 x Per Day/30 Days Discharge Instructions: Use skin prep as directed Topical: topical compounding antibiotics 1 x Per Day/30 Days Discharge Instructions: apply directly to wound bed. Prim Dressing: Hydrofera Blue Ready Foam, 4x5 in 1 x Per Day/30 Days ary Discharge Instructions: Apply to wound bed as instructed Secondary Dressing: Zetuvit Plus Silicone Border Dressing 4x4 (in/in) 1 x Per Day/30 Days Discharge Instructions: Apply silicone border over primary dressing as directed. WOUND #3: - Lower Leg Wound Laterality: Right, Medial Cleanser:  Soap and Water 1 x Per Day/30 Days Discharge Instructions: Brian shower and wash wound with dial antibacterial soap and water prior to dressing change. Peri-Wound Care: Skin Prep 1 x Per Day/30 Days Discharge Instructions: Use skin prep as directed Topical: topical compounding antibiotics 1 x Per Day/30 Days Discharge Instructions: apply directly to wound bed. Prim Dressing: Hydrofera Blue Ready Foam, 4x5 in 1 x Per Day/30 Days ary Discharge Instructions: Apply to wound bed as instructed Secondary Dressing: Zetuvit Plus Silicone Border Dressing 4x4 (in/in) 1 x Per Day/30 Days Discharge Instructions: Apply silicone border over primary dressing as directed. WOUND #4: - Lower Leg Wound Laterality: Right, Anterior Cleanser: Soap and Water 1 x Per Day/30 Days Discharge Instructions: Brian shower and wash wound with dial antibacterial soap and water prior to dressing change. Peri-Wound Care: Skin Prep 1 x Per Day/30 Days Discharge Instructions: Use skin prep as directed Topical: topical compounding antibiotics 1 x Per Day/30 Days Discharge Instructions: apply directly to wound bed. Prim Dressing: Hydrofera Blue Ready Foam, 4x5 in 1 x Per Day/30 Days ary Discharge Instructions: Apply to wound bed as instructed Secondary Dressing: Zetuvit Plus Silicone Border Dressing 4x4 (in/in) 1 x Per Day/30 Days Discharge Instructions: Apply silicone border over primary dressing as directed. 1. In office sharp debridement 2. Hydrofera Blue with [facility] antibiotic 3. Daily compression stockings Electronic Signature(s) Signed: 03/03/2022 11:55:17 AM  By: Brian Shan DO Entered By: Brian Leon on 03/03/2022 09:04:06 -------------------------------------------------------------------------------- HxROS Details Patient Name: Date of Service: Brian Leon, Brian Brian P. 03/03/2022 8:15 A M Medical Record Number: [id number] Patient Account Number: [id number] Date of Birth/Sex: Treating RN: Brian 03, 1947 (76 y.o. Brian Leon Primary Care Provider: Gerlene Leon Other Clinician: Referring Provider: Treating Provider/Extender: Brian Leon in Treatment: 16 Information Obtained From Chart Eyes Medical History: Negative for: Cataracts; Glaucoma; Optic Neuritis Past Medical History Notes: Blind right eye WEARS PROSTHESIS Ear/Nose/Mouth/Throat Medical History: Negative for: Chronic sinus problems/congestion; Middle ear problems Past Medical History Notes: Allergic rhinitis Hematologic/Lymphatic Medical History: Negative for: Anemia; Hemophilia; Human Immunodeficiency Virus; Lymphedema; Sickle Cell Disease Respiratory Medical History: Positive for: Asthma Negative for: Aspiration; Chronic Obstructive Pulmonary Disease (COPD); Pneumothorax; Sleep Apnea; Tuberculosis Past Medical History Notes: Pneumonia Cardiovascular Medical History: Positive for: Hypertension Negative for: Angina; Arrhythmia; Congestive Heart Failure; Coronary Artery Disease; Deep Vein Thrombosis; Hypotension; Myocardial Infarction; Peripheral Arterial Disease; Peripheral Venous Disease; Phlebitis; Vasculitis Past Medical History Notes: Thoracic aortic aneurysm Thrombocytosis Gastrointestinal Medical History: Positive for: Hepatitis A Negative for: Cirrhosis ; Colitis; Crohns; Hepatitis B; Hepatitis C Endocrine Medical History: Negative for: Type I Diabetes; Type II Diabetes Genitourinary Medical History: Negative for: End Stage Renal Disease Past Medical History Notes: 1/3kidney missing per patient-Renal Cell carcinoma Renal Cyst 5 years ago Immunological Medical History: Negative for: Lupus Erythematosus; Raynauds; Scleroderma Integumentary (Skin) Medical History: Negative for: History of Burn Musculoskeletal Medical History: Positive for: Rheumatoid Arthritis Negative for: Gout; Osteoarthritis; Osteomyelitis Past Medical History Notes: 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 Actinic Keratoses Eczema Neurologic Medical History: Past Medical History Notes: Paraureteric diverticulum Oncologic Medical History: Past Medical History Notes: Renal Cell carcinoma- 5 years ago Psychiatric Medical History: Negative for: Anorexia/bulimia; Confinement Anxiety Immunizations Pneumococcal Vaccine: Received Pneumococcal Vaccination: Yes Received Pneumococcal Vaccination On or After 60th Birthday: Yes Implantable Devices No devices added Hospitalization / Surgery History Type of Hospitalization/Surgery 5 years ago Renal Cell carcinoma Family and Social History Cancer: Yes - Mother; Diabetes: No; Heart Disease: No; Hypertension: No; Kidney Disease: No; Lung Disease: No; Seizures: No; Stroke: No; Thyroid Problems: No; Tuberculosis: No; Former smoker - quit 40 years ago; Marital Status - Married; Alcohol Use: Never; Drug Use: Current History - marijuana; Caffeine Use: Rarely; Financial Concerns: No; Food, Clothing or Shelter Needs: No; Support System Lacking: No; Transportation Concerns: No Electronic Signature(s) Signed: 03/03/2022 11:55:17 AM By: Brian Shan DO Signed: 03/03/2022 4:48:29 PM By: Brian Pilling RN, BSN Entered By: Brian Leon on 03/03/2022 08:59:49 -------------------------------------------------------------------------------- SuperBill Details Patient Name: Date of Service: Brian Leon, Brian Brian P. 03/03/2022 Medical Record Number: [id number] Patient Account Number: [id number] Date of Birth/Sex: Treating RN: Dec 15, 1945 (76 y.o. Brian Leon Primary Care Provider: Gerlene Leon Other Clinician: Referring Provider: Treating Provider/Extender: Brian Leon in Treatment: 16 Diagnosis Coding ICD-10 Codes Code Description [id number] Non-pressure chronic ulcer of other part of left lower leg with fat layer exposed L97.912 Non-pressure chronic ulcer of unspecified part of  right lower leg with fat layer exposed T14.90XA Injury, unspecified, initial encounter M35.3 Polymyalgia rheumatica I87.2 Venous insufficiency (chronic) (peripheral) Facility Procedures CPT4 Code: [id number] Description: 11042 - DEB SUBQ TISSUE 20 SQ CM/< ICD-10 Diagnosis Description L97.822 Non-pressure chronic ulcer of other part of left lower leg with fat layer expo L97.912 Non-pressure chronic ulcer of unspecified part of right lower leg with fat lay Modifier: sed er exposed Quantity: 1 Physician Procedures : CPT4 Code Description  Modifier [id number] 0200 - WC PHYS LEVEL 2 - EST PT ICD-10 Diagnosis Description L97.822 Non-pressure chronic ulcer of other part of left lower leg with fat layer exposed L97.912 Non-pressure chronic ulcer of unspecified part of  right lower leg with fat layer exposed T14.90XA Injury, unspecified, initial encounter I87.2 Venous insufficiency (chronic) (peripheral) Quantity: 1 : [id number] 11042 - WC PHYS SUBQ TISS 20 SQ CM ICD-10 Diagnosis Description L97.822 Non-pressure chronic ulcer of other part of left lower leg with fat layer exposed L97.912 Non-pressure chronic ulcer of unspecified part of right lower leg with fat layer  exposed Quantity: 1 Electronic Signature(s) Signed: 03/03/2022 11:55:17 AM By: Brian Shan DO Entered By: Brian Leon on 03/03/2022 09:04:23

## 2022-03-09 ENCOUNTER — Other Ambulatory Visit: Payer: Self-pay | Admitting: Family Medicine

## 2022-03-09 DIAGNOSIS — R058 Other specified cough: Secondary | ICD-10-CM

## 2022-03-15 ENCOUNTER — Other Ambulatory Visit (HOSPITAL_COMMUNITY): Payer: Self-pay

## 2022-03-15 ENCOUNTER — Telehealth: Payer: Self-pay | Admitting: Pharmacist

## 2022-03-15 DIAGNOSIS — M810 Age-related osteoporosis without current pathological fracture: Secondary | ICD-10-CM

## 2022-03-15 DIAGNOSIS — Z79899 Other long term (current) drug therapy: Secondary | ICD-10-CM

## 2022-03-15 DIAGNOSIS — E559 Vitamin D deficiency, unspecified: Secondary | ICD-10-CM

## 2022-03-15 NOTE — Telephone Encounter (Signed)
Patient due for Prolia on 04/09/22. Per test claim through pharmacy benefit, patient has no copay for Prolia.  He can have labs drawn the last week of June 2023 and receive Prolia on or after 04/09/22  [name], PharmD, MPH, BCPS, CPP Clinical Pharmacist (Rheumatology and Pulmonology)

## 2022-03-18 ENCOUNTER — Other Ambulatory Visit (HOSPITAL_COMMUNITY): Payer: Self-pay

## 2022-03-21 ENCOUNTER — Telehealth: Payer: Self-pay | Admitting: Rheumatology

## 2022-03-21 ENCOUNTER — Other Ambulatory Visit (HOSPITAL_COMMUNITY): Payer: Self-pay

## 2022-03-21 ENCOUNTER — Other Ambulatory Visit: Payer: Self-pay | Admitting: Rheumatology

## 2022-03-21 DIAGNOSIS — M810 Age-related osteoporosis without current pathological fracture: Secondary | ICD-10-CM

## 2022-03-21 NOTE — Telephone Encounter (Signed)
Returned call to pt. Spoke with his significant other, Hope - advised that he is not due until 04/09/22 for Prolia and recommend he have labs drawn next week.  [name], PharmD, MPH, BCPS, CPP Clinical Pharmacist (Rheumatology and Pulmonology)

## 2022-03-21 NOTE — Telephone Encounter (Signed)
Patient called the office requesting a call back to schedule his Prolia.

## 2022-03-22 ENCOUNTER — Other Ambulatory Visit (HOSPITAL_COMMUNITY): Payer: Self-pay

## 2022-03-24 ENCOUNTER — Encounter (HOSPITAL_BASED_OUTPATIENT_CLINIC_OR_DEPARTMENT_OTHER): Payer: Medicare Other | Admitting: Internal Medicine

## 2022-03-24 DIAGNOSIS — T1490XA Injury, unspecified, initial encounter: Secondary | ICD-10-CM

## 2022-03-24 DIAGNOSIS — S41101A Unspecified open wound of right upper arm, initial encounter: Secondary | ICD-10-CM | POA: Diagnosis not present

## 2022-03-24 DIAGNOSIS — L97912 Non-pressure chronic ulcer of unspecified part of right lower leg with fat layer exposed: Secondary | ICD-10-CM | POA: Diagnosis not present

## 2022-03-24 DIAGNOSIS — L97822 Non-pressure chronic ulcer of other part of left lower leg with fat layer exposed: Secondary | ICD-10-CM | POA: Diagnosis not present

## 2022-03-24 NOTE — Progress Notes (Signed)
KRISTJAN, DERNER ([id number]) Visit Report for 03/24/2022 Chief Complaint Document Details Patient Name: Date of Service: [name], [name] 03/24/2022 8:15 A M Medical Record Number: [id number] Patient Account Number: [id number] Date of Birth/Sex: Treating RN: 16-Jul-1946 (76 y.o. [name] Primary Care Provider: Gerlene Fee Other Clinician: Referring Provider: Treating Provider/Extender: Hubert Azure in Treatment: 61 Information Obtained from: Patient Chief Complaint 11/11/2021; Left lower extremity wound due to trauma 02/03/2022; bilateral lower extremity wounds due to trauma Electronic Signature(s) Signed: 03/24/2022 11:57:22 AM By: Kalman Shan DO Entered By: Kalman Shan on 03/24/2022 09:18:26 -------------------------------------------------------------------------------- Debridement Details Patient Name: Date of Service: [name], RO NN P. 03/24/2022 8:15 A M Medical Record Number: [id number] Patient Account Number: [id number] Date of Birth/Sex: Treating RN: December 07, 1945 (77 y.o. [name] Primary Care Provider: Gerlene Fee Other Clinician: Referring Provider: Treating Provider/Extender: Hubert Azure in Treatment: 19 Debridement Performed for Assessment: Wound #2 Left,Anterior Lower Leg Performed By: Physician Kalman Shan, DO Debridement Type: Debridement Level of Consciousness (Pre-procedure): Awake and Alert Pre-procedure Verification/Time Out Yes - 08:45 Taken: Start Time: 08:46 Pain Control: Lidocaine 5% topical ointment T Area Debrided (L x W): otal 1 (cm) x 0.9 (cm) = 0.9 (cm) Tissue and other material debrided: Viable, Non-Viable, Slough, Subcutaneous, Skin: Dermis , Skin: Epidermis, Fibrin/Exudate, Slough Level: Skin/Subcutaneous Tissue Debridement Description: Excisional Instrument: Curette Bleeding: Moderate Hemostasis Achieved: Pressure End Time: 08:53 Procedural  Pain: 0 Post Procedural Pain: 0 Response to Treatment: Procedure was tolerated well Level of Consciousness (Post- Awake and Alert procedure): Post Debridement Measurements of Total Wound Length: (cm) 1 Width: (cm) 0.9 Depth: (cm) 0.1 Volume: (cm) 0.071 Character of Wound/Ulcer Post Debridement: Improved Post Procedure Diagnosis Same as Pre-procedure Electronic Signature(s) Signed: 03/24/2022 11:57:22 AM By: Kalman Shan DO Signed: 03/24/2022 5:10:40 PM By: Deon Pilling RN, BSN Entered By: Deon Pilling on 03/24/2022 08:54:09 -------------------------------------------------------------------------------- Debridement Details Patient Name: Date of Service: [name], [place] NN P. 03/24/2022 8:15 A M Medical Record Number: [id number] Patient Account Number: [id number] Date of Birth/Sex: Treating RN: 1946/02/28 (76 y.o. [name] Primary Care Provider: Gerlene Fee Other Clinician: Referring Provider: Treating Provider/Extender: Hubert Azure in Treatment: 19 Debridement Performed for Assessment: Wound #3 Right,Medial Lower Leg Performed By: Physician Kalman Shan, DO Debridement Type: Debridement Level of Consciousness (Pre-procedure): Awake and Alert Pre-procedure Verification/Time Out Yes - 08:45 Taken: Start Time: 08:46 Pain Control: Lidocaine 5% topical ointment T Area Debrided (L x W): otal 2.8 (cm) x 1.8 (cm) = 5.04 (cm) Tissue and other material debrided: Viable, Non-Viable, Slough, Subcutaneous, Skin: Dermis , Skin: Epidermis, Fibrin/Exudate, Slough Level: Skin/Subcutaneous Tissue Debridement Description: Excisional Instrument: Curette Bleeding: Moderate Hemostasis Achieved: Pressure End Time: 08:53 Procedural Pain: 0 Post Procedural Pain: 0 Response to Treatment: Procedure was tolerated well Level of Consciousness (Post- Awake and Alert procedure): Post Debridement Measurements of Total Wound Length: (cm)  2.8 Width: (cm) 1.8 Depth: (cm) 0.1 Volume: (cm) 0.396 Character of Wound/Ulcer Post Debridement: Improved Post Procedure Diagnosis Same as Pre-procedure Electronic Signature(s) Signed: 03/24/2022 11:57:22 AM By: Kalman Shan DO Signed: 03/24/2022 5:10:40 PM By: Deon Pilling RN, BSN Entered By: Deon Pilling on 03/24/2022 08:54:33 -------------------------------------------------------------------------------- Debridement Details Patient Name: Date of Service: [name], [place] NN P. 03/24/2022 8:15 A M Medical Record Number: [id number] Patient Account Number: [id number] Date of Birth/Sex: Treating RN: 01/18/1946 (76 y.o. [name] Primary Care Provider: Other Clinician: Gerlene Fee Referring Provider: Treating Provider/Extender: Kalman Shan  Autry-Lott, Antony Haste in Treatment: 19 Debridement Performed for Assessment: Wound #4 Right,Anterior Lower Leg Performed By: Physician Kalman Shan, DO Debridement Type: Debridement Level of Consciousness (Pre-procedure): Awake and Alert Pre-procedure Verification/Time Out Yes - 08:45 Taken: Start Time: 08:46 Pain Control: Lidocaine 5% topical ointment T Area Debrided (L x W): otal 1.3 (cm) x 1.7 (cm) = 2.21 (cm) Tissue and other material debrided: Viable, Non-Viable, Slough, Subcutaneous, Skin: Dermis , Skin: Epidermis, Fibrin/Exudate, Slough Level: Skin/Subcutaneous Tissue Debridement Description: Excisional Instrument: Curette Bleeding: Moderate Hemostasis Achieved: Pressure End Time: 08:53 Procedural Pain: 0 Post Procedural Pain: 0 Response to Treatment: Procedure was tolerated well Level of Consciousness (Post- Awake and Alert procedure): Post Debridement Measurements of Total Wound Length: (cm) 1.3 Width: (cm) 1.7 Depth: (cm) 0.2 Volume: (cm) 0.347 Character of Wound/Ulcer Post Debridement: Improved Post Procedure Diagnosis Same as Pre-procedure Electronic Signature(s) Signed: 03/24/2022  11:57:22 AM By: Kalman Shan DO Signed: 03/24/2022 5:10:40 PM By: Deon Pilling RN, BSN Entered By: Deon Pilling on 03/24/2022 08:54:59 -------------------------------------------------------------------------------- Debridement Details Patient Name: Date of Service: [name], [place] NN P. 03/24/2022 8:15 A M Medical Record Number: [id number] Patient Account Number: [id number] Date of Birth/Sex: Treating RN: 1946/05/23 (76 y.o. [name] Primary Care Provider: Gerlene Fee Other Clinician: Referring Provider: Treating Provider/Extender: Hubert Azure in Treatment: 19 Debridement Performed for Assessment: Wound #5 Right Elbow Performed By: Physician Kalman Shan, DO Debridement Type: Debridement Level of Consciousness (Pre-procedure): Awake and Alert Pre-procedure Verification/Time Out Yes - 08:45 Taken: Start Time: 08:46 Pain Control: Lidocaine 5% topical ointment T Area Debrided (L x W): otal 1.6 (cm) x 3.5 (cm) = 5.6 (cm) Tissue and other material debrided: Viable, Non-Viable, Slough, Subcutaneous, Skin: Dermis , Skin: Epidermis, Fibrin/Exudate, Slough Level: Skin/Subcutaneous Tissue Debridement Description: Excisional Instrument: Curette Bleeding: Moderate Hemostasis Achieved: Pressure End Time: 08:53 Procedural Pain: 0 Post Procedural Pain: 0 Response to Treatment: Procedure was tolerated well Level of Consciousness (Post- Awake and Alert procedure): Post Debridement Measurements of Total Wound Length: (cm) 1.6 Width: (cm) 3.5 Depth: (cm) 0.1 Volume: (cm) 0.44 Character of Wound/Ulcer Post Debridement: Improved Post Procedure Diagnosis Same as Pre-procedure Electronic Signature(s) Signed: 03/24/2022 11:57:22 AM By: Kalman Shan DO Signed: 03/24/2022 5:10:40 PM By: Deon Pilling RN, BSN Entered By: Deon Pilling on 03/24/2022  08:55:15 -------------------------------------------------------------------------------- HPI Details Patient Name: Date of Service: [name], [place] NN P. 03/24/2022 8:15 A M Medical Record Number: [id number] Patient Account Number: [id number] Date of Birth/Sex: Treating RN: 08-10-1946 (76 y.o. [name] Primary Care Provider: Gerlene Fee Other Clinician: Referring Provider: Treating Provider/Extender: Hubert Azure in Treatment: 19 History of Present Illness HPI Description: Admission 11/11/2021 Mr. Daquane Holeman is a 76 year old male with a past medical history of polymyalgia rheumatica that presents to the clinic for a 54-monthhistory of nonhealing wound to the left lower extremity. He states that on Halloween he hit an object and created a wound that has not healed. He states he has seen dermatology and his primary care physician for this issue. On one occasion he had an UHaematologist He has also used Xeroform. It does not sound like he uses any kind of wound dressing consistently. He currently reports chronic pain to the wound site. He denies systemic signs of infection. He denies purulent drainage. 2/16; patient presents for follow-up. He has been using Hydrofera Blue and gentamicin ointment to the wound bed without issues. He reports improvement in wound healing. He currently denies signs of infection. 2/23;  patient presents for follow-up. He continues to use Hydrofera Blue and gentamicin ointment to the wound bed without issues. He denies signs of infection. He reports improvement in his chronic pain to the wound bed. 3/2; patient presents for follow-up. He has been using Hydrofera Blue and gentamicin to the wound bed without issues. He reports he is going to [place] for the next 3 weeks. He currently denies signs of infection. 3/24; patient presents for follow-up. He has been in [place] for the past 3 weeks for vacation. He enjoyed his trip. He reports  no issues today. He has been using Hydrofera Blue and gentamicin ointment to the wound beds without issues. 4/7; patient presents for follow-up. He has been using Hydrofera Blue to the wound bed without issues. He reports that his wound is healed. 02/03/2022; patient was discharged 1 month ago with a closed wound to the left lower extremity. Unfortunately over the past month he has developed 3 new wounds he states was caused by hitting his legs against different objects. He has been keeping the areas covered with DuoDERM. He reports mild tenderness to the right lower extremity wound bed with increased redness. He denies purulent drainage. He states he has his previous wound care supplies of Hydrofera Blue and gentamicin ointment. 5/11; patient presents for follow-up. He had a PCR culture done at last clinic visit that showed high levels of Staph aureus and viridans and low levels of coagulase-negative staph. I started him on Augmentin at last clinic visit but he reports continued pain and erythema to the wound sites. He has been using gentamicin ointment and Hydrofera Blue with dressing changes. He also presents with a 100.2 temp cough and diarrhea. He has not taken anything for his symptoms. 5/18; patient presents for follow-up. He received his Keystone antibiotics and started using this. Unfortunately he mixed it incorrectly and it turned into a hardened substance. He had to use his refill to start all over again. He has only been using this for 1 day. He states he is combining this correctly now. He completed his course of clindamycin. He denies signs of infection. 5/25; patient presents for follow-up. He has been using Keystone antibiotics with Sorbact. He reports more serous drainage from the left lower extremity. Other than that he has no issues or complaints. He denies signs of infection. 6/1; patient presents for follow-up. We switched to Iodoflex and Keystone antibiotic under compression  therapy at last clinic visit. He tolerated this treatment well. He has no issues or complaints today. He denies signs of infection. Unfortunately he will be out of town for the next 3 weeks. 6/22; patient presents for follow-up. He was in [place] for the past 3 weeks. He has been using compression stockings daily along with [facility] antibiotic and Hydrofera Blue. Unfortunately he developed a skin tear to his right arm after hitting a door. Electronic Signature(s) Signed: 03/24/2022 11:57:22 AM By: Kalman Shan DO Entered By: Kalman Shan on 03/24/2022 09:19:25 -------------------------------------------------------------------------------- Physical Exam Details Patient Name: Date of Service: [name], [place] NN P. 03/24/2022 8:15 A M Medical Record Number: [id number] Patient Account Number: [id number] Date of Birth/Sex: Treating RN: 15-Oct-1945 (76 y.o. [name] Primary Care Provider: Gerlene Fee Other Clinician: Referring Provider: Treating Provider/Extender: Hubert Azure in Treatment: 19 Constitutional respirations regular, non-labored and within target range for patient.. Cardiovascular 2+ dorsalis pedis/posterior tibialis pulses. Psychiatric pleasant and cooperative. Notes Right lower extremity: open wound to the proximal aspect below the knee with Granulation  tissue and nonviable tissue. He also has a small circular wound with tightly adhered nonviable tissue And granulation tissue to the distal portion of the leg. Left lower extremity: T the proximal aspect below the knee there is an open wound with tightly adhered nonviable Tissue and granulation tissue. Right arm: o Open wound with granulation tissue and nonviable tissue. No signs of surrounding infection to either wound bed Electronic Signature(s) Signed: 03/24/2022 11:57:22 AM By: Kalman Shan DO Entered By: Kalman Shan on 03/24/2022  09:21:02 -------------------------------------------------------------------------------- Physician Orders Details Patient Name: Date of Service: [name], RO NN P. 03/24/2022 8:15 A M Medical Record Number: [id number] Patient Account Number: [id number] Date of Birth/Sex: Treating RN: 05-06-1946 (76 y.o. 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 - Dr. Heber [place] and [place], Room 6 overflow 03/31/2022 0800 Thursday Return A Dr. Heber [place] and [place], Room 8 04/07/2022 0815 Thursday Bathing/ Shower/ Hygiene May shower with protection but do not get wound dressing(s) wet. Edema Control - Lymphedema / SCD / Other Elevate legs to the level of the heart or above for 30 minutes daily and/or when sitting, a frequency of: - 3-4 times a day throughout the day. Avoid standing for long periods of time. Exercise regularly Moisturize legs daily. - every night before bed. do not get directly on wounds Wound Treatment Wound #2 - Lower Leg Wound Laterality: Left, Anterior Cleanser: Soap and Water 1 x Per Week/30 Days Discharge Instructions: May shower and wash wound with dial antibacterial soap and water prior to dressing change. Peri-Wound Care: Sween Lotion (Moisturizing lotion) 1 x Per Week/30 Days Discharge Instructions: Apply moisturizing lotion as directed Topical: topical compounding antibiotics 1 x Per Week/30 Days Discharge  Instructions: apply directly to wound bed. Prim Dressing: PolyMem Silver Non-Adhesive Dressing, 4.25x4.25 in 1 x Per Week/30 Days ary Discharge Instructions: Apply to wound bed as instructed Secondary Dressing: ABD Pad, 8x10 1 x Per Week/30 Days Discharge Instructions: Apply over primary dressing as directed. Compression Wrap: ThreePress (3 layer compression wrap) 1 x Per Week/30 Days Discharge Instructions: Apply three layer compression as directed. Wound #3 - Lower Leg Wound Laterality: Right, Medial Cleanser: Soap and Water 1 x Per Week/30 Days Discharge Instructions: May shower and wash wound with dial antibacterial soap and water prior to dressing change. Peri-Wound Care: Sween Lotion (Moisturizing lotion) 1 x Per Week/30 Days Discharge Instructions: Apply moisturizing lotion as directed Topical: topical compounding antibiotics 1 x Per Week/30 Days Discharge Instructions: apply directly to wound bed. Prim Dressing: PolyMem Silver Non-Adhesive Dressing, 4.25x4.25 in 1 x Per Week/30 Days ary Discharge Instructions: Apply to wound bed as instructed Secondary Dressing: ABD Pad, 8x10 1 x Per Week/30 Days Discharge Instructions: Apply over primary dressing as directed. Compression Wrap: ThreePress (3 layer compression wrap) 1 x Per Week/30 Days Discharge Instructions: Apply three layer compression as directed. Wound #4 - Lower Leg Wound Laterality: Right, Anterior Cleanser: Soap and Water 1 x Per Week/30 Days Discharge Instructions: May shower and wash wound with dial antibacterial soap and water prior to dressing change. Peri-Wound Care: Sween Lotion (Moisturizing  lotion) 1 x Per Week/30 Days Discharge Instructions: Apply moisturizing lotion as directed Topical: topical compounding antibiotics 1 x Per Week/30 Days Discharge Instructions: apply directly to wound bed. Prim Dressing: PolyMem Silver Non-Adhesive Dressing, 4.25x4.25 in 1 x Per Week/30 Days ary Discharge Instructions: Apply  to wound bed as instructed Secondary Dressing: ABD Pad, 8x10 1 x Per Week/30 Days Discharge Instructions: Apply over primary dressing as directed. Compression Wrap: ThreePress (3 layer compression wrap) 1 x Per Week/30 Days Discharge Instructions: Apply three layer compression as directed. Wound #5 - Elbow Wound Laterality: Right Cleanser: Soap and Water 1 x Per Day/30 Days Discharge Instructions: May shower and wash wound with dial antibacterial soap and water prior to dressing change. Cleanser: Wound Cleanser 1 x Per Day/30 Days Discharge Instructions: Cleanse the wound with wound cleanser prior to applying a clean dressing using gauze sponges, not tissue or cotton balls. Peri-Wound Care: Skin Prep (DME) (Generic) 1 x Per Day/30 Days Discharge Instructions: Use skin prep as directed Topical: bacitracin 1 x Per Day/30 Days Discharge Instructions: bacitracin ointment daily to wound. Secondary Dressing: Zetuvit Plus Silicone Border Dressing 4x4 (in/in) (DME) (Generic) 1 x Per Day/30 Days Discharge Instructions: Apply silicone border over primary dressing as directed. Electronic Signature(s) Signed: 03/24/2022 11:57:22 AM By: Kalman Shan DO Entered By: Kalman Shan on 03/24/2022 09:21:17 -------------------------------------------------------------------------------- Problem List Details Patient Name: Date of Service: [name], RO NN P. 03/24/2022 8:15 A M Medical Record Number: [id number] Patient Account Number: [id number] Date of Birth/Sex: Treating RN: 06-21-1946 (76 y.o. [name], [name] Primary Care Provider: Gerlene Fee Other Clinician: Referring Provider: Treating Provider/Extender: Hubert Azure in Treatment: 19 Active Problems ICD-10 Encounter Code Description Active Date MDM Diagnosis [phone] Non-pressure chronic ulcer of other part of left lower leg with fat layer exposed2/06/2022 No Yes L97.912 Non-pressure chronic ulcer of  unspecified part of right lower leg with fat layer 02/03/2022 No Yes exposed S41.101A Unspecified open wound of right upper arm, initial encounter 03/24/2022 No Yes T14.90XA Injury, unspecified, initial encounter 11/11/2021 No Yes M35.3 Polymyalgia rheumatica 11/11/2021 No Yes I87.2 Venous insufficiency (chronic) (peripheral) 02/24/2022 No Yes Inactive Problems Resolved Problems Electronic Signature(s) Signed: 03/24/2022 11:57:22 AM By: Kalman Shan DO Entered By: Kalman Shan on 03/24/2022 09:18:04 -------------------------------------------------------------------------------- Progress Note Details Patient Name: Date of Service: [name], RO NN P. 03/24/2022 8:15 A M Medical Record Number: [id number] Patient Account Number: [id number] Date of Birth/Sex: Treating RN: 07/12/1946 (76 y.o. [name] Primary Care Provider: Gerlene Fee Other Clinician: Referring Provider: Treating Provider/Extender: Hubert Azure in Treatment: 91 Subjective Chief Complaint Information obtained from Patient 11/11/2021; Left lower extremity wound due to trauma 02/03/2022; bilateral lower extremity wounds due to trauma History of Present Illness (HPI) Admission 11/11/2021 Mr. Kekoa Entwistle is a 76 year old male with a past medical history of polymyalgia rheumatica that presents to the clinic for a 36-monthhistory of nonhealing wound to the left lower extremity. He states that on Halloween he hit an object and created a wound that has not healed. He states he has seen dermatology and his primary care physician for this issue. On one occasion he had an UHaematologist He has also used Xeroform. It does not sound like he uses any kind of wound dressing consistently. He currently reports chronic pain to the wound site. He denies systemic signs of infection. He denies purulent drainage. 2/16; patient presents for follow-up. He has been using Hydrofera Blue and gentamicin ointment to the  wound  bed without issues. He reports improvement in wound healing. He currently denies signs of infection. 2/23; patient presents for follow-up. He continues to use Hydrofera Blue and gentamicin ointment to the wound bed without issues. He denies signs of infection. He reports improvement in his chronic pain to the wound bed. 3/2; patient presents for follow-up. He has been using Hydrofera Blue and gentamicin to the wound bed without issues. He reports he is going to [place] for the next 3 weeks. He currently denies signs of infection. 3/24; patient presents for follow-up. He has been in [place] for the past 3 weeks for vacation. He enjoyed his trip. He reports no issues today. He has been using Hydrofera Blue and gentamicin ointment to the wound beds without issues. 4/7; patient presents for follow-up. He has been using Hydrofera Blue to the wound bed without issues. He reports that his wound is healed. 02/03/2022; patient was discharged 1 month ago with a closed wound to the left lower extremity. Unfortunately over the past month he has developed 3 new wounds he states was caused by hitting his legs against different objects. He has been keeping the areas covered with DuoDERM. He reports mild tenderness to the right lower extremity wound bed with increased redness. He denies purulent drainage. He states he has his previous wound care supplies of Hydrofera Blue and gentamicin ointment. 5/11; patient presents for follow-up. He had a PCR culture done at last clinic visit that showed high levels of Staph aureus and viridans and low levels of coagulase-negative staph. I started him on Augmentin at last clinic visit but he reports continued pain and erythema to the wound sites. He has been using gentamicin ointment and Hydrofera Blue with dressing changes. He also presents with a 100.2 temp cough and diarrhea. He has not taken anything for his symptoms. 5/18; patient presents for follow-up. He received  his Keystone antibiotics and started using this. Unfortunately he mixed it incorrectly and it turned into a hardened substance. He had to use his refill to start all over again. He has only been using this for 1 day. He states he is combining this correctly now. He completed his course of clindamycin. He denies signs of infection. 5/25; patient presents for follow-up. He has been using Keystone antibiotics with Sorbact. He reports more serous drainage from the left lower extremity. Other than that he has no issues or complaints. He denies signs of infection. 6/1; patient presents for follow-up. We switched to Iodoflex and Keystone antibiotic under compression therapy at last clinic visit. He tolerated this treatment well. He has no issues or complaints today. He denies signs of infection. Unfortunately he will be out of town for the next 3 weeks. 6/22; patient presents for follow-up. He was in [place] for the past 3 weeks. He has been using compression stockings daily along with [facility] antibiotic and Hydrofera Blue. Unfortunately he developed a skin tear to his right arm after hitting a door. Patient History Information obtained from Chart. Family History Cancer - Mother, No family history of Diabetes, Heart Disease, Hypertension, Kidney Disease, Lung Disease, Seizures, Stroke, Thyroid Problems, Tuberculosis. Social History Former smoker - quit 40 years ago, Marital Status - Married, Alcohol Use - Never, Drug Use - Current History - marijuana, Caffeine Use - Rarely. Medical History Eyes Denies history of Cataracts, Glaucoma, Optic Neuritis Ear/Nose/Mouth/Throat Denies history of Chronic sinus problems/congestion, Middle ear problems Hematologic/Lymphatic Denies history of Anemia, Hemophilia, Human Immunodeficiency Virus, Lymphedema, Sickle Cell Disease Respiratory Patient has history of  Asthma Denies history of Aspiration, Chronic Obstructive Pulmonary Disease (COPD), Pneumothorax, Sleep  Apnea, Tuberculosis Cardiovascular Patient has history of Hypertension Denies history of Angina, Arrhythmia, Congestive Heart Failure, Coronary Artery Disease, Deep Vein Thrombosis, Hypotension, Myocardial Infarction, Peripheral Arterial Disease, Peripheral Venous Disease, Phlebitis, Vasculitis Gastrointestinal Patient has history of Hepatitis A Denies history of Cirrhosis , Colitis, Crohnoos, Hepatitis B, Hepatitis C Endocrine Denies history of Type I Diabetes, Type II Diabetes Genitourinary Denies history of End Stage Renal Disease Immunological Denies history of Lupus Erythematosus, Raynaudoos, Scleroderma Integumentary (Skin) Denies history of History of Burn Musculoskeletal Patient has history of Rheumatoid Arthritis Denies history of Gout, Osteoarthritis, Osteomyelitis Psychiatric Denies history of Anorexia/bulimia, Confinement Anxiety Hospitalization/Surgery History - 5 years ago Renal Cell carcinoma. 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 Vitals Time Taken: 8:20 AM, Height: 68 in, Weight: 140 lbs, BMI: 21.3, Temperature: 97.8 F, Pulse: 67 bpm, Respiratory Rate: 20 breaths/min, Blood Pressure: 156/77 mmHg. Cardiovascular 2+ dorsalis pedis/posterior tibialis pulses. Psychiatric pleasant and cooperative. General Notes: Right lower extremity: open wound to the proximal aspect below the knee with Granulation tissue and nonviable tissue. He  also has a small circular wound with tightly adhered nonviable tissue And granulation tissue to the distal portion of the leg. Left lower extremity: T the proximal aspect below the o knee there is an open wound with tightly adhered nonviable Tissue and granulation tissue. Right arm: Open wound with granulation tissue and nonviable tissue. No signs of surrounding infection to either wound bed Integumentary (Hair, Skin) Wound #2 status is Open. Original cause of wound was Trauma. The date acquired was: 01/04/2022. The wound has been in treatment 7 weeks. The wound is located on the Left,Anterior Lower Leg. The wound measures 1cm length x 0.9cm width x 0.1cm depth; 0.707cm^2 area and 0.071cm^3 volume. There is Fat Layer (Subcutaneous Tissue) exposed. There is no tunneling or undermining noted. There is a medium amount of serosanguineous drainage noted. The wound margin is distinct with the outline attached to the wound base. There is large (67-100%) pink granulation within the wound bed. There is a small (1-33%) amount of necrotic tissue within the wound bed including Adherent Slough. Wound #3 status is Open. Original cause of wound was Trauma. The date acquired was: 01/24/2022. The wound has been in treatment 7 weeks. The wound is located on the Right,Medial Lower Leg. The wound measures 2.8cm length x 1.8cm width x 0.1cm depth; 3.958cm^2 area and 0.396cm^3 volume. There is Fat Layer (Subcutaneous Tissue) exposed. There is no tunneling or undermining noted. There is a medium amount of serosanguineous drainage noted. The wound margin is distinct with the outline attached to the wound base. There is large (67-100%) red, pink granulation within the wound bed. There is a small (1-33%) amount of necrotic tissue within the wound bed including Adherent Slough. Wound #4 status is Open. Original cause of wound was Trauma. The date acquired was: 01/04/2022. The wound has been in treatment 7 weeks. The wound  is located on the Right,Anterior Lower Leg. The wound measures 1.3cm length x 1.7cm width x 0.2cm depth; 1.736cm^2 area and 0.347cm^3 volume. There is Fat Layer (Subcutaneous Tissue) exposed.  There is no tunneling or undermining noted. There is a medium amount of serosanguineous drainage noted. The wound margin is distinct with the outline attached to the wound base. There is medium (34-66%) pink granulation within the wound bed. There is a medium (34-66%) amount of necrotic tissue within the wound bed including Adherent Slough. Wound #5 status is Open. Original cause of wound was Trauma. The date acquired was: 03/22/2022. The wound is located on the Right Elbow. The wound measures 1.6cm length x 3.5cm width x 0.1cm depth; 4.398cm^2 area and 0.44cm^3 volume. There is Fat Layer (Subcutaneous Tissue) exposed. There is no tunneling or undermining noted. There is a medium amount of serosanguineous drainage noted. The wound margin is distinct with the outline attached to the wound base. There is large (67-100%) pink granulation within the wound bed. There is a small (1-33%) amount of necrotic tissue within the wound bed including Adherent Slough. 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 I debrided nonviable tissue. No signs of surrounding infection. The other remaining wounds have improved in size and appearance. I also debrided nonviable tissue to these areas. I recommended compression therapy and I will switch the dressing to PolyMem silver. He has unfortunately developed a new  wound to the right arm secondary to trauma. I debrided nonviable here. I recommended bacitracin and keeping the area covered daily Procedures Wound #2 Pre-procedure diagnosis of Wound #2 is an Abrasion located on the Left,Anterior Lower Leg . There was a Excisional Skin/Subcutaneous Tissue Debridement with a total area of 0.9 sq cm performed by Kalman Shan, DO. With the following instrument(s): Curette to remove Viable and Non-Viable tissue/material. Material removed includes Subcutaneous Tissue, Slough, Skin: Dermis, Skin: Epidermis, and Fibrin/Exudate after achieving pain control using Lidocaine 5% topical ointment. A time out was conducted at 08:45, prior to the start of the procedure. A Moderate amount of bleeding was controlled with Pressure. The procedure was tolerated well with a pain level of 0 throughout and a pain level of 0 following the procedure. Post Debridement Measurements: 1cm length x 0.9cm width x 0.1cm depth; 0.071cm^3 volume. Character of Wound/Ulcer Post Debridement is improved. Post procedure Diagnosis Wound #2: Same as Pre-Procedure Pre-procedure diagnosis of Wound #2 is an Abrasion located on the Left,Anterior Lower Leg . There was a Three Layer Compression Therapy Procedure by Deon Pilling, RN. Post procedure Diagnosis Wound #2: Same as Pre-Procedure Wound #3 Pre-procedure diagnosis of Wound #3 is an Abrasion located on the Right,Medial Lower Leg . There was a Excisional Skin/Subcutaneous Tissue Debridement with a total area of 5.04 sq cm performed by Kalman Shan, DO. With the following instrument(s): Curette to remove Viable and Non-Viable tissue/material. Material removed includes Subcutaneous Tissue, Slough, Skin: Dermis, Skin: Epidermis, and Fibrin/Exudate after achieving pain control using Lidocaine 5% topical ointment. A time out was conducted at 08:45, prior to the start of the procedure. A Moderate amount of bleeding was controlled with Pressure. The  procedure was tolerated well with a pain level of 0 throughout and a pain level of 0 following the procedure. Post Debridement Measurements: 2.8cm length x 1.8cm width x 0.1cm depth; 0.396cm^3 volume. Character of Wound/Ulcer Post Debridement  is improved. Post procedure Diagnosis Wound #3: Same as Pre-Procedure Pre-procedure diagnosis of Wound #3 is an Abrasion located on the Right,Medial Lower Leg . There was a Three Layer Compression Therapy Procedure by Deon Pilling, RN. Post procedure Diagnosis Wound #3: Same as Pre-Procedure Wound #4 Pre-procedure diagnosis of Wound #4 is an Abrasion located on the Right,Anterior Lower Leg . There was a Excisional Skin/Subcutaneous Tissue Debridement with a total area of 2.21 sq cm performed by Kalman Shan, DO. With the following instrument(s): Curette to remove Viable and Non-Viable tissue/material. Material removed includes Subcutaneous Tissue, Slough, Skin: Dermis, Skin: Epidermis, and Fibrin/Exudate after achieving pain control using Lidocaine 5% topical ointment. A time out was conducted at 08:45, prior to the start of the procedure. A Moderate amount of bleeding was controlled with Pressure. The procedure was tolerated well with a pain level of 0 throughout and a pain level of 0 following the procedure. Post Debridement Measurements: 1.3cm length x 1.7cm width x 0.2cm depth; 0.347cm^3 volume. Character of Wound/Ulcer Post Debridement is improved. Post procedure Diagnosis Wound #4: Same as Pre-Procedure Pre-procedure diagnosis of Wound #4 is an Abrasion located on the Right,Anterior Lower Leg . There was a Three Layer Compression Therapy Procedure by Deon Pilling, RN. Post procedure Diagnosis Wound #4: Same as Pre-Procedure Wound #5 Pre-procedure diagnosis of Wound #5 is a Skin T located on the Right Elbow . There was a Excisional Skin/Subcutaneous Tissue Debridement with a total ear area of 5.6 sq cm performed by Kalman Shan, DO. With the  following instrument(s): Curette to remove Viable and Non-Viable tissue/material. Material removed includes Subcutaneous Tissue, Slough, Skin: Dermis, Skin: Epidermis, and Fibrin/Exudate after achieving pain control using Lidocaine 5% topical ointment. A time out was conducted at 08:45, prior to the start of the procedure. A Moderate amount of bleeding was controlled with Pressure. The procedure was tolerated well with a pain level of 0 throughout and a pain level of 0 following the procedure. Post Debridement Measurements: 1.6cm length x 3.5cm width x 0.1cm depth; 0.44cm^3 volume. Character of Wound/Ulcer Post Debridement is improved. Post procedure Diagnosis Wound #5: Same as Pre-Procedure Plan Follow-up Appointments: Return Appointment in 1 week. - Dr. Heber [place] and [place], Room 6 overflow 03/31/2022 [address] Thursday Dr. Heber [place] and [place], Room 8 04/07/2022 0815 Thursday Bathing/ Shower/ Hygiene: May shower with protection but do not get wound dressing(s) wet. Edema Control - Lymphedema / SCD / Other: Elevate legs to the level of the heart or above for 30 minutes daily and/or when sitting, a frequency of: - 3-4 times a day throughout the day. Avoid standing for long periods of time. Exercise regularly Moisturize legs daily. - every night before bed. do not get directly on wounds WOUND #2: - Lower Leg Wound Laterality: Left, Anterior Cleanser: Soap and Water 1 x Per Week/30 Days Discharge Instructions: May shower and wash wound with dial antibacterial soap and water prior to dressing change. Peri-Wound Care: Sween Lotion (Moisturizing lotion) 1 x Per Week/30 Days Discharge Instructions: Apply moisturizing lotion as directed Topical: topical compounding antibiotics 1 x Per Week/30 Days Discharge Instructions: apply directly to wound bed. Prim Dressing: PolyMem Silver Non-Adhesive Dressing, 4.25x4.25 in 1 x Per Week/30 Days ary Discharge Instructions: Apply to wound bed as instructed Secondary  Dressing: ABD Pad, 8x10 1 x Per Week/30 Days Discharge Instructions: Apply over primary dressing as directed. Com pression Wrap: ThreePress (3 layer compression wrap) 1 x Per Week/30 Days Discharge Instructions: Apply three layer compression as directed. WOUND #3: - Lower  Leg Wound Laterality: Right, Medial Cleanser: Soap and Water 1 x Per Week/30 Days Discharge Instructions: May shower and wash wound with dial antibacterial soap and water prior to dressing change. Peri-Wound Care: Sween Lotion (Moisturizing lotion) 1 x Per Week/30 Days Discharge Instructions: Apply moisturizing lotion as directed Topical: topical compounding antibiotics 1 x Per Week/30 Days Discharge Instructions: apply directly to wound bed. Prim Dressing: PolyMem Silver Non-Adhesive Dressing, 4.25x4.25 in 1 x Per Week/30 Days ary Discharge Instructions: Apply to wound bed as instructed Secondary Dressing: ABD Pad, 8x10 1 x Per Week/30 Days Discharge Instructions: Apply over primary dressing as directed. Com pression Wrap: ThreePress (3 layer compression wrap) 1 x Per Week/30 Days Discharge Instructions: Apply three layer compression as directed. WOUND #4: - Lower Leg Wound Laterality: Right, Anterior Cleanser: Soap and Water 1 x Per Week/30 Days Discharge Instructions: May shower and wash wound with dial antibacterial soap and water prior to dressing change. Peri-Wound Care: Sween Lotion (Moisturizing lotion) 1 x Per Week/30 Days Discharge Instructions: Apply moisturizing lotion as directed Topical: topical compounding antibiotics 1 x Per Week/30 Days Discharge Instructions: apply directly to wound bed. Prim Dressing: PolyMem Silver Non-Adhesive Dressing, 4.25x4.25 in 1 x Per Week/30 Days ary Discharge Instructions: Apply to wound bed as instructed Secondary Dressing: ABD Pad, 8x10 1 x Per Week/30 Days Discharge Instructions: Apply over primary dressing as directed. Com pression Wrap: ThreePress (3 layer compression  wrap) 1 x Per Week/30 Days Discharge Instructions: Apply three layer compression as directed. WOUND #5: - Elbow Wound Laterality: Right Cleanser: Soap and Water 1 x Per Day/30 Days Discharge Instructions: May shower and wash wound with dial antibacterial soap and water prior to dressing change. Cleanser: Wound Cleanser 1 x Per Day/30 Days Discharge Instructions: Cleanse the wound with wound cleanser prior to applying a clean dressing using gauze sponges, not tissue or cotton balls. Peri-Wound Care: Skin Prep (DME) (Generic) 1 x Per Day/30 Days Discharge Instructions: Use skin prep as directed Topical: bacitracin 1 x Per Day/30 Days Discharge Instructions: bacitracin ointment daily to wound. Secondary Dressing: Zetuvit Plus Silicone Border Dressing 4x4 (in/in) (DME) (Generic) 1 x Per Day/30 Days Discharge Instructions: Apply silicone border over primary dressing as directed. 1. In office sharp debridement 2. PolyMem silver with [facility]stone antibiotic under 3 layer compression to legs bilaterally 3. Bacitracinoo Right arm 4. Follow-up in 1 week Electronic Signature(s) Signed: 03/24/2022 11:57:22 AM By: Kalman Shan DO Entered By: Kalman Shan on 03/24/2022 09:26:47 -------------------------------------------------------------------------------- HxROS Details Patient Name: Date of Service: [name], RO NN P. 03/24/2022 8:15 A M Medical Record Number: [id number] Patient Account Number: [id number] Date of Birth/Sex: Treating RN: Feb 13, 1946 (77 y.o. [name] Primary Care Provider: Gerlene Fee Other Clinician: Referring Provider: Treating Provider/Extender: Hubert Azure in Treatment: 19 Information Obtained From Chart Eyes Medical History: Negative for: Cataracts; Glaucoma; Optic Neuritis Past Medical History Notes: Blind right eye WEARS PROSTHESIS Ear/Nose/Mouth/Throat Medical History: Negative for: Chronic sinus problems/congestion;  Middle ear problems Past Medical History Notes: Allergic rhinitis Hematologic/Lymphatic Medical History: Negative for: Anemia; Hemophilia; Human Immunodeficiency Virus; Lymphedema; Sickle Cell Disease Respiratory Medical History: Positive for: Asthma Negative for: Aspiration; Chronic Obstructive Pulmonary Disease (COPD); Pneumothorax; Sleep Apnea; Tuberculosis Past Medical History Notes: Pneumonia Cardiovascular Medical History: Positive for: Hypertension Negative for: Angina; Arrhythmia; Congestive Heart Failure; Coronary Artery Disease; Deep Vein Thrombosis; Hypotension; Myocardial Infarction; Peripheral Arterial Disease; Peripheral Venous Disease; Phlebitis; Vasculitis Past Medical History Notes: Thoracic aortic aneurysm Thrombocytosis Gastrointestinal Medical History: Positive for: Hepatitis A  Negative for: Cirrhosis ; Colitis; Crohns; Hepatitis B; Hepatitis C Endocrine Medical History: Negative for: Type I Diabetes; Type II Diabetes Genitourinary Medical History: Negative for: End Stage Renal Disease Past Medical History Notes: 1/3kidney missing per patient-Renal Cell carcinoma Renal Cyst 5 years ago Immunological Medical History: Negative for: Lupus Erythematosus; Raynauds; Scleroderma Integumentary (Skin) Medical History: Negative for: History of Burn Musculoskeletal Medical History: Positive for: Rheumatoid Arthritis Negative for: Gout; Osteoarthritis; Osteomyelitis Past Medical History Notes: 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 Eczema Neurologic Medical History: Past Medical History Notes: Paraureteric diverticulum Oncologic Medical History: Past Medical History Notes: Renal Cell carcinoma- 5 years ago Psychiatric Medical History: Negative for: Anorexia/bulimia; Confinement Anxiety Immunizations Pneumococcal Vaccine: Received Pneumococcal Vaccination: Yes Received Pneumococcal Vaccination On or  After 60th Birthday: Yes Implantable Devices No devices added Hospitalization / Surgery History Type of Hospitalization/Surgery 5 years ago Renal Cell carcinoma Family and Social History Cancer: Yes - Mother; Diabetes: No; Heart Disease: No; Hypertension: No; Kidney Disease: No; Lung Disease: No; Seizures: No; Stroke: No; Thyroid Problems: No; Tuberculosis: No; Former smoker - quit 40 years ago; Marital Status - Married; Alcohol Use: Never; Drug Use: Current History - marijuana; Caffeine Use: Rarely; Financial Concerns: No; Food, Clothing or Shelter Needs: No; Support System Lacking: No; Transportation Concerns: No Electronic Signature(s) Signed: 03/24/2022 11:57:22 AM By: Kalman Shan DO Signed: 03/24/2022 5:10:40 PM By: Deon Pilling RN, BSN Entered By: Kalman Shan on 03/24/2022 09:19:34 -------------------------------------------------------------------------------- SuperBill Details Patient Name: Date of Service: [name], RO NN P. 03/24/2022 Medical Record Number: [id number] Patient Account Number: [id number] Date of Birth/Sex: Treating RN: Oct 30, 1945 (76 y.o. 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 S41.101A Unspecified open wound of right upper arm, initial encounter T14.90XA Injury, unspecified, initial encounter M35.3 Polymyalgia rheumatica I87.2 Venous insufficiency (chronic) (peripheral) Facility Procedures CPT4 Code: [id number] Description: 44010 - WOUND CARE VISIT-LEV 3 EST PT Modifier: Quantity: 1 CPT4 Code: [id number] Description: 40347 - DEB SUBQ TISSUE 20 SQ CM/< ICD-10 Diagnosis  Description L97.822 Non-pressure chronic ulcer of other part of left lower leg with fat layer expose L97.912 Non-pressure chronic ulcer of unspecified part of right lower leg with fat layer Modifier: d exposed Quantity: 1 Physician Procedures : CPT4 Code Description Modifier [id number] - WC PHYS LEVEL 3 - EST PT ICD-10 Diagnosis Description S41.101A Unspecified open wound of right upper arm, initial encounter T14.90XA Injury, unspecified, initial encounter Quantity: 1 : [id number] 11042 - WC PHYS SUBQ TISS 20 SQ CM ICD-10 Diagnosis Description L97.822 Non-pressure chronic ulcer of other part of left lower leg with fat layer exposed L97.912 Non-pressure chronic ulcer of unspecified part of right lower leg with fat layer  exposed Quantity: 1 Electronic Signature(s) Signed: 03/24/2022 11:57:22 AM By: Kalman Shan DO Entered By: Kalman Shan on 03/24/2022 84:16:60

## 2022-03-24 NOTE — Progress Notes (Signed)
KENYA, SHIRAISHI ([id number]) Visit Report for 03/24/2022 Arrival Information Details Patient Name: Date of Service: Brian Leon, [name] 03/24/2022 8:15 A M Medical Record Number: [id number] Patient Account Number: [id number] Date of Birth/Sex: Treating RN: 09-Apr-1946 (76 y.o. [name], [name] Primary Care Raylie Maddison: Gerlene Fee Other Clinician: Referring Kayanna Mckillop: Treating Blaklee Shores/Extender: Hubert Azure in Treatment: 56 Visit Information History Since Last Visit Added or deleted any medications: No Patient Arrived: Ambulatory Any new allergies or adverse reactions: No Arrival Time: 08:20 Had a fall or experienced change in No Accompanied By: self activities of daily living that may affect Transfer Assistance: None risk of falls: Patient Identification Verified: Yes Signs or symptoms of abuse/neglect since last visito No Secondary Verification Process Completed: Yes Hospitalized since last visit: No Patient Requires Transmission-Based Precautions: No Implantable device outside of the clinic excluding No Patient Has Alerts: Yes cellular tissue based products placed in the center Patient Alerts: 11/02/21 L ABI: 1.37 since last visit: 11/02/2021 L TBI 0.77 Has Dressing in Place as Prescribed: Yes 11/02/2021 R TBI 0.69 Pain Present Now: No 11/02/2021 R ABI 1.3 Electronic Signature(s) Signed: 03/24/2022 5:10:40 PM By: Deon Pilling RN, BSN Entered By: Deon Pilling on 03/24/2022 08:21:06 -------------------------------------------------------------------------------- Clinic Level of Care Assessment Details Patient Name: Date of Service: Brian Leon, RO NN P. 03/24/2022 8:15 A M Medical Record Number: [id number] Patient Account Number: [id number] Date of Birth/Sex: Treating RN: [birth year] (76 y.o. [name] Primary Care Evalee Gerard: Gerlene Fee Other Clinician: Referring Tkeya Stencil: Treating Alverna Fawley/Extender: Hubert Azure in Treatment: 19 Clinic Level of Care Assessment Items TOOL 3 Quantity Score X- 1 0 Use when EandM and Procedure is performed on FOLLOW-UP visit ASSESSMENTS - Nursing Assessment / Reassessment X- 1 10 Reassessment of Co-morbidities (includes updates in patient status) X- 1 5 Reassessment of Adherence to Treatment Plan ASSESSMENTS - Wound and Skin Assessment / Reassessment _0  - Points for Wound Assessment can only be taken for a new wound of unknown or different etiology and a procedure is 0 NOT performed to that wound X- 1 5 Simple Wound Assessment / Reassessment - one wound _1  - 0 Complex Wound Assessment / Reassessment - multiple wounds X- 1 10 Dermatologic / Skin Assessment (not related to wound area) ASSESSMENTS - Focused Assessment _2  - 0 Circumferential Edema Measurements - multi extremities _3  - 0 Nutritional Assessment / Counseling / Intervention _4  - 0 Lower Extremity Assessment (monofilament, tuning fork, pulses) _5  - 0 Peripheral Arterial Disease Assessment (using hand held doppler) ASSESSMENTS - Ostomy and/or Continence Assessment and Care _6  - 0 Incontinence Assessment and Management _7  - 0 Ostomy Care Assessment and Management (repouching, etc.) PROCESS - Coordination of Care _8  - Points for Discharge Coordination can only be taken for a new wound of unknown or different etiology and a procedure 0 is NOT performed to that wound X- 1 15 Simple Patient / Family Education for ongoing care _9  - 0 Complex (extensive) Patient / Family Education for ongoing care X- 1 10 Staff obtains Programmer, systems, Records, T Results / Process Orders est _10  - 0 Staff telephones HHA, Nursing Homes / Clarify orders / etc _11  - 0 Routine Transfer to another Facility (non-emergent condition) _12  - 0 Routine Hospital Admission (non-emergent condition) _13  - 0 New Admissions / Biomedical engineer / Ordering NPWT Apligraf, etc. , _14  - 0 Emergency Hospital Admission  (emergent condition) X- 1 10 Simple Discharge Coordination _15  - 0 Complex (extensive) Discharge Coordination PROCESS - Special  Needs _0  - 0 Pediatric / Minor Patient Management _1  - 0 Isolation Patient Management _2  - 0 Hearing / Language / Visual special needs _3  - 0 Assessment of Community assistance (transportation, D/C planning, etc.) _4  - 0 Additional assistance / Altered mentation _5  - 0 Support Surface(s) Assessment (bed, cushion, seat, etc.) INTERVENTIONS - Wound Cleansing / Measurement _6  - Points for Wound Cleaning / Measurement, Wound Dressing, Specimen Collection and Specimen taken to lab can only 0 be taken for a new wound of unknown or different etiology and a procedure is NOT performed to that wound X- 1 5 Simple Wound Cleansing - one wound _7  - 0 Complex Wound Cleansing - multiple wounds X- 1 5 Wound Imaging (photographs - any number of wounds) _8  - 0 Wound Tracing (instead of photographs) X- 1 5 Simple Wound Measurement - one wound _9  - 0 Complex Wound Measurement - multiple wounds INTERVENTIONS - Wound Dressings X - Small Wound Dressing one or multiple wounds 1 10 _10  - 0 Medium Wound Dressing one or multiple wounds _11  - 0 Large Wound Dressing one or multiple wounds INTERVENTIONS - Miscellaneous _12  - 0 External ear exam _13  - 0 Specimen Collection (cultures, biopsies, blood, body fluids, etc.) _14  - 0 Specimen(s) / Culture(s) sent or taken to Lab for analysis _15  - 0 Patient Transfer (multiple staff / Civil Service fast streamer / Similar devices) _16  - 0 Simple Staple / Suture removal (25 or less) _17  - 0 Complex Staple / Suture removal (26 or more) _18  - 0 Hypo / Hyperglycemic Management (close monitor of Blood Glucose) _19  - 0 Ankle / Brachial Index (ABI) - do not check if billed separately X- 1 5 Vital Signs Has the patient been seen at the hospital within the last three years: Yes Total Score: 95 Level Of Care: New/Established - Level 3 Electronic  Signature(s) Signed: 03/24/2022 5:10:40 PM By: Deon Pilling RN, BSN Entered By: Deon Pilling on 03/24/2022 09:04:26 -------------------------------------------------------------------------------- Compression Therapy Details Patient Name: Date of Service: Brian Leon, RO NN P. 03/24/2022 8:15 A M Medical Record Number: [id number] Patient Account Number: [id number] Date of Birth/Sex: Treating RN: 06-09-1946 (76 y.o. [name] Primary Care Shahira Fiske: Gerlene Fee Other Clinician: Referring Jahki Witham: Treating Debbi Strandberg/Extender: Hubert Azure in Treatment: 19 Compression Therapy Performed for Wound Assessment: Wound #2 Left,Anterior Lower Leg Performed By: Clinician Deon Pilling, RN Compression Type: Three Layer Post Procedure Diagnosis Same as Pre-procedure Electronic Signature(s) Signed: 03/24/2022 5:10:40 PM By: Deon Pilling RN, BSN Entered By: Deon Pilling on 03/24/2022 09:03:49 -------------------------------------------------------------------------------- Compression Therapy Details Patient Name: Date of Service: Brian Leon, RO NN P. 03/24/2022 8:15 A M Medical Record Number: [id number] Patient Account Number: [id number] Date of Birth/Sex: Treating RN: [birth year] (76 y.o. [name] Primary Care Hydeia Mcatee: Gerlene Fee Other Clinician: Referring Jashanti Clinkscale: Treating Gerald Kuehl/Extender: Hubert Azure in Treatment: 19 Compression Therapy Performed for Wound Assessment: Wound #3 Right,Medial Lower Leg Performed By: Clinician Deon Pilling, RN Compression Type: Three Layer Post Procedure Diagnosis Same as Pre-procedure Electronic Signature(s) Signed: 03/24/2022 5:10:40 PM By: Deon Pilling RN, BSN Entered By: Deon Pilling on 03/24/2022 09:03:49 -------------------------------------------------------------------------------- Compression Therapy Details Patient Name: Date of Service: Brian Leon, RO NN P.  03/24/2022 8:15 A M Medical Record Number: [id number] Patient Account Number: [id number] Date of Birth/Sex: Treating RN: [birth year] (76 y.o. [name] Primary Care Elliette Seabolt: Gerlene Fee Other Clinician: Referring Jandy Brackens: Treating Alycea Segoviano/Extender: Hubert Azure in Treatment: 19 Compression Therapy Performed for Wound  Assessment: Wound #4 Right,Anterior Lower Leg Performed By: Clinician Deon Pilling, RN Compression Type: Three Layer Post Procedure Diagnosis Same as Pre-procedure Electronic Signature(s) Signed: 03/24/2022 5:10:40 PM By: Deon Pilling RN, BSN Entered By: Deon Pilling on 03/24/2022 09:03:49 -------------------------------------------------------------------------------- Encounter Discharge Information Details Patient Name: Date of Service: Brian Leon, RO NN P. 03/24/2022 8:15 A M Medical Record Number: [id number] Patient Account Number: [id number] Date of Birth/Sex: Treating RN: 1946-06-28 (76 y.o. [name] Primary Care Naylee Frankowski: Gerlene Fee Other Clinician: Referring Cerinity Zynda: Treating Chandlar Guice/Extender: Hubert Azure in Treatment: 40 Encounter Discharge Information Items Post Procedure Vitals Discharge Condition: Stable Temperature (F): 97.8 Ambulatory Status: Ambulatory Pulse (bpm): 67 Discharge Destination: Home Respiratory Rate (breaths/min): 20 Transportation: Private Auto Blood Pressure (mmHg): 156/77 Accompanied By: self Schedule Follow-up Appointment: Yes Clinical Summary of Care: Electronic Signature(s) Signed: 03/24/2022 5:10:40 PM By: Deon Pilling RN, BSN Entered By: Deon Pilling on 03/24/2022 09:05:20 -------------------------------------------------------------------------------- Lower Extremity Assessment Details Patient Name: Date of Service: Brian Leon, RO NN P. 03/24/2022 8:15 A M Medical Record Number: [id number] Patient Account Number: [id number] Date  of Birth/Sex: Treating RN: 07-Dec-1945 (76 y.o. [name] Primary Care Malcolm Quast: Gerlene Fee Other Clinician: Referring Braxen Dobek: Treating Jeriah Skufca/Extender: Hubert Azure in Treatment: 19 Edema Assessment Assessed: [name]: Yes] [name]: Yes] Edema: [Left: No] [Right: No] Calf Left: Right: Point of Measurement: 37 cm From Medial Instep 29 cm 29 cm Ankle Left: Right: Point of Measurement: 12 cm From Medial Instep 19 cm 19 cm Vascular Assessment Pulses: Dorsalis Pedis Palpable: [Left:Yes] [Right:Yes] Electronic Signature(s) Signed: 03/24/2022 5:10:40 PM By: Deon Pilling RN, BSN Entered By: Deon Pilling on 03/24/2022 08:23:56 -------------------------------------------------------------------------------- Multi Wound Chart Details Patient Name: Date of Service: Brian Leon, RO NN P. 03/24/2022 8:15 A M Medical Record Number: [id number] Patient Account Number: [id number] Date of Birth/Sex: Treating RN: 1945/10/15 (76 y.o. [name] Primary Care Anthonyjames Bargar: Gerlene Fee Other Clinician: Referring Bari Handshoe: Treating Kema Santaella/Extender: Hubert Azure in Treatment: 19 Vital Signs Height(in): 37 Pulse(bpm): 1 Weight(lbs): 140 Blood Pressure(mmHg): 156/77 Body Mass Index(BMI): 21.3 Temperature(F): 97.8 Respiratory Rate(breaths/min): 20 Photos: Left, Anterior Lower Leg Right, Medial Lower Leg Right, Anterior Lower Leg Wound Location: Trauma Trauma Trauma Wounding Event: Abrasion Abrasion Abrasion Primary Etiology: Asthma, Hypertension, Hepatitis A, Asthma, Hypertension, Hepatitis A, Asthma, Hypertension, Hepatitis A, Comorbid History: Rheumatoid Arthritis Rheumatoid Arthritis Rheumatoid Arthritis 01/04/2022 01/24/2022 01/04/2022 Date Acquired: _0 Weeks of Treatment: Open Open Open Wound Status: No No No Wound Recurrence: 1x0.9x0.1 2.8x1.8x0.1 1.3x1.7x0.2 Measurements L x W x D (cm) 0.707  3.958 1.736 A (cm) : rea 0.071 0.396 0.347 Volume (cm) : -66.70% 68.00% -426.10% % Reduction in Area: -69.00% 68.00% -951.50% % Reduction in Volume: Full Thickness Without Exposed Full Thickness Without Exposed Full Thickness Without Exposed Classification: Support Structures Support Structures Support Structures Medium Medium Medium Exudate Amount: Serosanguineous Serosanguineous Serosanguineous Exudate Type: red, brown red, brown red, brown Exudate Color: Distinct, outline attached Distinct, outline attached Distinct, outline attached Wound Margin: Large (67-100%) Large (67-100%) Medium (34-66%) Granulation Amount: Pink Red, Pink Pink Granulation Quality: Small (1-33%) Small (1-33%) Medium (34-66%) Necrotic Amount: Fat Layer (Subcutaneous Tissue): Yes Fat Layer (Subcutaneous Tissue): Yes Fat Layer (Subcutaneous Tissue): Yes Exposed Structures: Fascia: No Fascia: No Fascia: No Tendon: No Tendon: No Tendon: No Muscle: No Muscle: No Muscle: No Joint: No Joint: No Joint: No Bone: No Bone: No Bone: No Medium (34-66%) Medium (34-66%) None Epithelialization: Debridement - Excisional Debridement - Excisional Debridement -  Excisional Debridement: Pre-procedure Verification/Time Out 08:45 08:45 08:45 Taken: Lidocaine 5% topical ointment Lidocaine 5% topical ointment Lidocaine 5% topical ointment Pain Control: Subcutaneous, Slough Subcutaneous, Slough Subcutaneous, Slough Tissue Debrided: Skin/Subcutaneous Tissue Skin/Subcutaneous Tissue Skin/Subcutaneous Tissue Level: 0.9 5.04 2.21 Debridement A (sq cm): rea Curette Curette Curette Instrument: Moderate Moderate Moderate Bleeding: Pressure Pressure Pressure Hemostasis A chieved: 0 0 0 Procedural Pain: 0 0 0 Post Procedural Pain: Procedure was tolerated well Procedure was tolerated well Procedure was tolerated well Debridement Treatment Response: 1x0.9x0.1 2.8x1.8x0.1 1.3x1.7x0.2 Post Debridement  Measurements L x W x D (cm) 0.071 0.396 0.347 Post Debridement Volume: (cm) Compression Therapy Compression Therapy Compression Therapy Procedures Performed: Debridement Debridement Debridement Wound Number: 5 N/A N/A Photos: N/A N/A Right Elbow N/A N/A Wound Location: Trauma N/A N/A Wounding Event: Skin T ear N/A N/A Primary Etiology: Asthma, Hypertension, Hepatitis A, N/A N/A Comorbid History: Rheumatoid Arthritis 03/22/2022 N/A N/A Date Acquired: 0 N/A N/A Weeks of Treatment: Open N/A N/A Wound Status: No N/A N/A Wound Recurrence: 1.6x3.5x0.1 N/A N/A Measurements L x W x D (cm) 4.398 N/A N/A A (cm) : rea 0.44 N/A N/A Volume (cm) : 0.00% N/A N/A % Reduction in A rea: 0.00% N/A N/A % Reduction in Volume: Full Thickness Without Exposed N/A N/A Classification: Support Structures Medium N/A N/A Exudate A mount: Serosanguineous N/A N/A Exudate Type: red, brown N/A N/A Exudate Color: Distinct, outline attached N/A N/A Wound Margin: Large (67-100%) N/A N/A Granulation A mount: Pink N/A N/A Granulation Quality: Small (1-33%) N/A N/A Necrotic A mount: Fat Layer (Subcutaneous Tissue): Yes N/A N/A Exposed Structures: Fascia: No Tendon: No Muscle: No Joint: No Bone: No None N/A N/A Epithelialization: Debridement - Excisional N/A N/A Debridement: Pre-procedure Verification/Time Out 08:45 N/A N/A Taken: Lidocaine 5% topical ointment N/A N/A Pain Control: Subcutaneous, Slough N/A N/A Tissue Debrided: Skin/Subcutaneous Tissue N/A N/A Level: 5.6 N/A N/A Debridement A (sq cm): rea Curette N/A N/A Instrument: Moderate N/A N/A Bleeding: Pressure N/A N/A Hemostasis A chieved: 0 N/A N/A Procedural Pain: 0 N/A N/A Post Procedural Pain: Procedure was tolerated well N/A N/A Debridement Treatment Response: Post Debridement Measurements L x 1.6x3.5x0.1 N/A N/A W x D (cm) 0.44 N/A N/A Post Debridement Volume: (cm) Debridement N/A N/A Procedures  Performed: Treatment Notes Wound #2 (Lower Leg) Wound Laterality: Left, Anterior Cleanser Soap and Water Discharge Instruction: May shower and wash wound with dial antibacterial soap and water prior to dressing change. Peri-Wound Care Sween Lotion (Moisturizing lotion) Discharge Instruction: Apply moisturizing lotion as directed Topical topical compounding antibiotics Discharge Instruction: apply directly to wound bed. Primary Dressing PolyMem Silver Non-Adhesive Dressing, 4.25x4.25 in Discharge Instruction: Apply to wound bed as instructed Secondary Dressing ABD Pad, 8x10 Discharge Instruction: Apply over primary dressing as directed. Secured With Compression Wrap ThreePress (3 layer compression wrap) Discharge Instruction: Apply three layer compression as directed. Compression Stockings Add-Ons Wound #3 (Lower Leg) Wound Laterality: Right, Medial Cleanser Soap and Water Discharge Instruction: May shower and wash wound with dial antibacterial soap and water prior to dressing change. Peri-Wound Care Sween Lotion (Moisturizing lotion) Discharge Instruction: Apply moisturizing lotion as directed Topical topical compounding antibiotics Discharge Instruction: apply directly to wound bed. Primary Dressing PolyMem Silver Non-Adhesive Dressing, 4.25x4.25 in Discharge Instruction: Apply to wound bed as instructed Secondary Dressing ABD Pad, 8x10 Discharge Instruction: Apply over primary dressing as directed. Secured With Compression Wrap ThreePress (3 layer compression wrap) Discharge Instruction: Apply three layer compression as directed. Compression Stockings Add-Ons Wound #4 (Lower Leg) Wound Laterality: Right, Anterior  Cleanser Soap and Water Discharge Instruction: May shower and wash wound with dial antibacterial soap and water prior to dressing change. Peri-Wound Care Sween Lotion (Moisturizing lotion) Discharge Instruction: Apply moisturizing lotion as  directed Topical topical compounding antibiotics Discharge Instruction: apply directly to wound bed. Primary Dressing PolyMem Silver Non-Adhesive Dressing, 4.25x4.25 in Discharge Instruction: Apply to wound bed as instructed Secondary Dressing ABD Pad, 8x10 Discharge Instruction: Apply over primary dressing as directed. Secured With Compression Wrap ThreePress (3 layer compression wrap) Discharge Instruction: Apply three layer compression as directed. Compression Stockings Add-Ons Wound #5 (Elbow) Wound Laterality: Right Cleanser Soap and Water Discharge Instruction: May shower and wash wound with dial antibacterial soap and water prior to dressing change. Wound Cleanser Discharge Instruction: Cleanse the wound with wound cleanser prior to applying a clean dressing using gauze sponges, not tissue or cotton balls. Peri-Wound Care Skin Prep Discharge Instruction: Use skin prep as directed Topical bacitracin Discharge Instruction: bacitracin ointment daily to wound. Primary Dressing Secondary Dressing Zetuvit Plus Silicone Border Dressing 4x4 (in/in) Discharge Instruction: Apply silicone border over primary dressing as directed. Secured With Compression Wrap Compression Stockings Add-Ons Electronic Signature(s) Signed: 03/24/2022 11:57:22 AM By: Kalman Shan DO Signed: 03/24/2022 5:10:40 PM By: Deon Pilling RN, BSN Entered By: Kalman Shan on 03/24/2022 09:18:13 -------------------------------------------------------------------------------- Multi-Disciplinary Care Plan Details Patient Name: Date of Service: Brian Leon, RO NN P. 03/24/2022 8:15 A M Medical Record Number: [id number] Patient Account Number: [id number] Date of Birth/Sex: Treating RN: 10-07-1945 (76 y.o. [name] Primary Care Jabre Heo: Gerlene Fee Other Clinician: Referring Mariena Meares: Treating Jiovanni Heeter/Extender: Hubert Azure in Treatment: 19 Active  Inactive Necrotic Tissue Nursing Diagnoses: Impaired tissue integrity related to necrotic/devitalized tissue Knowledge deficit related to management of necrotic/devitalized tissue Goals: Necrotic/devitalized tissue will be minimized in the wound bed Date Initiated: 02/03/2022 Target Resolution Date: 04/01/2022 Goal Status: Active Patient/caregiver will verbalize understanding of reason and process for debridement of necrotic tissue Date Initiated: 02/03/2022 Date Inactivated: 03/24/2022 Target Resolution Date: 04/01/2022 Goal Status: Met Interventions: Assess patient pain level pre-, during and post procedure and prior to discharge Provide education on necrotic tissue and debridement process Treatment Activities: Apply topical anesthetic as ordered : 02/03/2022 Excisional debridement : 02/03/2022 Notes: Electronic Signature(s) Signed: 03/24/2022 5:10:40 PM By: Deon Pilling RN, BSN Entered By: Deon Pilling on 03/24/2022 08:29:19 -------------------------------------------------------------------------------- Pain Assessment Details Patient Name: Date of Service: Brian Leon, RO NN P. 03/24/2022 8:15 A M Medical Record Number: [id number] Patient Account Number: [id number] Date of Birth/Sex: Treating RN: [birth year] (76 y.o. [name] Primary Care Murvin Gift: Gerlene Fee Other Clinician: Referring Novalynn Branaman: Treating Crystale Giannattasio/Extender: Hubert Azure in Treatment: 19 Active Problems Location of Pain Severity and Description of Pain Patient Has Paino No Site Locations Rate the pain. Rate the pain. Current Pain Level: 0 Pain Management and Medication Current Pain Management: Medication: No Cold Application: No Rest: No Massage: No Activity: No T.E.N.S.: No Heat Application: No Leg drop or elevation: No Is the Current Pain Management Adequate: Adequate How does your wound impact your activities of daily livingo Sleep: No Bathing:  No Appetite: No Relationship With Others: No Bladder Continence: No Emotions: No Bowel Continence: No Work: No Toileting: No Drive: No Dressing: No Hobbies: No Engineer, maintenance) Signed: 03/24/2022 5:10:40 PM By: Deon Pilling RN, BSN Entered By: Deon Pilling on 03/24/2022 08:21:16 -------------------------------------------------------------------------------- Patient/Caregiver Education Details Patient Name: Date of Service: Brian Leon, RO NN P. 6/22/2023andnbsp8:15 A M Medical Record Number: [id number] Patient Account Number:  [id number] Date of Birth/Gender: Treating RN: [birth year] (76 y.o. [name] Primary Care Physician: Gerlene Fee Other Clinician: Referring Physician: Treating Physician/Extender: Hubert Azure in Treatment: 38 Education Assessment Education Provided To: Patient Education Topics Provided Wound Debridement: Handouts: Wound Debridement Methods: Explain/Verbal Responses: Reinforcements needed Electronic Signature(s) Signed: 03/24/2022 5:10:40 PM By: Deon Pilling RN, BSN Entered By: Deon Pilling on 03/24/2022 08:29:30 -------------------------------------------------------------------------------- Wound Assessment Details Patient Name: Date of Service: Brian Leon, RO NN P. 03/24/2022 8:15 A M Medical Record Number: [id number] Patient Account Number: [id number] Date of Birth/Sex: Treating RN: October 13, 1945 (76 y.o. [name] Primary Care Kristoffer Bala: Gerlene Fee Other Clinician: Referring Sherrilynn Gudgel: Treating Bobby Ragan/Extender: Hubert Azure in Treatment: 19 Wound Status Wound Number: 2 Primary Etiology: Abrasion Wound Location: Left, Anterior Lower Leg Wound Status: Open Wounding Event: Trauma Comorbid History: Asthma, Hypertension, Hepatitis A, Rheumatoid Arthritis Date Acquired: 01/04/2022 Weeks Of Treatment: 7 Clustered Wound: No Photos Wound  Measurements Length: (cm) 1 Width: (cm) 0.9 Depth: (cm) 0.1 Area: (cm) 0.707 Volume: (cm) 0.071 % Reduction in Area: -66.7% % Reduction in Volume: -69% Epithelialization: Medium (34-66%) Tunneling: No Undermining: No Wound Description Classification: Full Thickness Without Exposed Support Structures Wound Margin: Distinct, outline attached Exudate Amount: Medium Exudate Type: Serosanguineous Exudate Color: red, brown Foul Odor After Cleansing: No Slough/Fibrino Yes Wound Bed Granulation Amount: Large (67-100%) Exposed Structure Granulation Quality: Pink Fascia Exposed: No Necrotic Amount: Small (1-33%) Fat Layer (Subcutaneous Tissue) Exposed: Yes Necrotic Quality: Adherent Slough Tendon Exposed: No Muscle Exposed: No Joint Exposed: No Bone Exposed: No Treatment Notes Wound #2 (Lower Leg) Wound Laterality: Left, Anterior Cleanser Soap and Water Discharge Instruction: May shower and wash wound with dial antibacterial soap and water prior to dressing change. Peri-Wound Care Sween Lotion (Moisturizing lotion) Discharge Instruction: Apply moisturizing lotion as directed Topical topical compounding antibiotics Discharge Instruction: apply directly to wound bed. Primary Dressing PolyMem Silver Non-Adhesive Dressing, 4.25x4.25 in Discharge Instruction: Apply to wound bed as instructed Secondary Dressing ABD Pad, 8x10 Discharge Instruction: Apply over primary dressing as directed. Secured With Compression Wrap ThreePress (3 layer compression wrap) Discharge Instruction: Apply three layer compression as directed. Compression Stockings Add-Ons Electronic Signature(s) Signed: 03/24/2022 5:10:40 PM By: Deon Pilling RN, BSN Entered By: Deon Pilling on 03/24/2022 08:31:32 -------------------------------------------------------------------------------- Wound Assessment Details Patient Name: Date of Service: Brian Leon, RO NN P. 03/24/2022 8:15 A M Medical Record Number:  [id number] Patient Account Number: [id number] Date of Birth/Sex: Treating RN: 10-28-1945 (76 y.o. [name] Primary Care Zay Yeargan: Gerlene Fee Other Clinician: Referring Francene Mcerlean: Treating Bridgette Wolden/Extender: Hubert Azure in Treatment: 19 Wound Status Wound Number: 3 Primary Etiology: Abrasion Wound Location: Right, Medial Lower Leg Wound Status: Open Wounding Event: Trauma Comorbid History: Asthma, Hypertension, Hepatitis A, Rheumatoid Arthritis Date Acquired: 01/24/2022 Weeks Of Treatment: 7 Clustered Wound: No Photos Wound Measurements Length: (cm) 2.8 Width: (cm) 1.8 Depth: (cm) 0.1 Area: (cm) 3.958 Volume: (cm) 0.396 % Reduction in Area: 68% % Reduction in Volume: 68% Epithelialization: Medium (34-66%) Tunneling: No Undermining: No Wound Description Classification: Full Thickness Without Exposed Support Structures Wound Margin: Distinct, outline attached Exudate Amount: Medium Exudate Type: Serosanguineous Exudate Color: red, brown Foul Odor After Cleansing: No Slough/Fibrino Yes Wound Bed Granulation Amount: Large (67-100%) Exposed Structure Granulation Quality: Red, Pink Fascia Exposed: No Necrotic Amount: Small (1-33%) Fat Layer (Subcutaneous Tissue) Exposed: Yes Necrotic Quality: Adherent Slough Tendon Exposed: No Muscle Exposed: No Joint Exposed: No Bone Exposed: No Treatment Notes Wound #3 (Lower  Leg) Wound Laterality: Right, Medial Cleanser Soap and Water Discharge Instruction: May shower and wash wound with dial antibacterial soap and water prior to dressing change. Peri-Wound Care Sween Lotion (Moisturizing lotion) Discharge Instruction: Apply moisturizing lotion as directed Topical topical compounding antibiotics Discharge Instruction: apply directly to wound bed. Primary Dressing PolyMem Silver Non-Adhesive Dressing, 4.25x4.25 in Discharge Instruction: Apply to wound bed as instructed Secondary  Dressing ABD Pad, 8x10 Discharge Instruction: Apply over primary dressing as directed. Secured With Compression Wrap ThreePress (3 layer compression wrap) Discharge Instruction: Apply three layer compression as directed. Compression Stockings Add-Ons Electronic Signature(s) Signed: 03/24/2022 5:10:40 PM By: Deon Pilling RN, BSN Entered By: Deon Pilling on 03/24/2022 08:31:11 -------------------------------------------------------------------------------- Wound Assessment Details Patient Name: Date of Service: Brian Leon, RO NN P. 03/24/2022 8:15 A M Medical Record Number: [id number] Patient Account Number: [id number] Date of Birth/Sex: Treating RN: [birth year] (76 y.o. [name] Primary Care Shereece Wellborn: Gerlene Fee Other Clinician: Referring Khali Albanese: Treating Margrette Wynia/Extender: Hubert Azure in Treatment: 19 Wound Status Wound Number: 4 Primary Etiology: Abrasion Wound Location: Right, Anterior Lower Leg Wound Status: Open Wounding Event: Trauma Comorbid History: Asthma, Hypertension, Hepatitis A, Rheumatoid Arthritis Date Acquired: 01/04/2022 Weeks Of Treatment: 7 Clustered Wound: No Photos Wound Measurements Length: (cm) 1.3 Width: (cm) 1.7 Depth: (cm) 0.2 Area: (cm) 1.736 Volume: (cm) 0.347 % Reduction in Area: -426.1% % Reduction in Volume: -951.5% Epithelialization: None Tunneling: No Undermining: No Wound Description Classification: Full Thickness Without Exposed Support Structures Wound Margin: Distinct, outline attached Exudate Amount: Medium Exudate Type: Serosanguineous Exudate Color: red, brown Foul Odor After Cleansing: No Slough/Fibrino Yes Wound Bed Granulation Amount: Medium (34-66%) Exposed Structure Granulation Quality: Pink Fascia Exposed: No Necrotic Amount: Medium (34-66%) Fat Layer (Subcutaneous Tissue) Exposed: Yes Necrotic Quality: Adherent Slough Tendon Exposed: No Muscle Exposed: No Joint  Exposed: No Bone Exposed: No Treatment Notes Wound #4 (Lower Leg) Wound Laterality: Right, Anterior Cleanser Soap and Water Discharge Instruction: May shower and wash wound with dial antibacterial soap and water prior to dressing change. Peri-Wound Care Sween Lotion (Moisturizing lotion) Discharge Instruction: Apply moisturizing lotion as directed Topical topical compounding antibiotics Discharge Instruction: apply directly to wound bed. Primary Dressing PolyMem Silver Non-Adhesive Dressing, 4.25x4.25 in Discharge Instruction: Apply to wound bed as instructed Secondary Dressing ABD Pad, 8x10 Discharge Instruction: Apply over primary dressing as directed. Secured With Compression Wrap ThreePress (3 layer compression wrap) Discharge Instruction: Apply three layer compression as directed. Compression Stockings Add-Ons Electronic Signature(s) Signed: 03/24/2022 5:10:40 PM By: Deon Pilling RN, BSN Entered By: Deon Pilling on 03/24/2022 08:30:54 -------------------------------------------------------------------------------- Wound Assessment Details Patient Name: Date of Service: Brian Leon, RO NN P. 03/24/2022 8:15 A M Medical Record Number: [id number] Patient Account Number: [id number] Date of Birth/Sex: Treating RN: 30-Jun-1946 (76 y.o. [name] Primary Care Brynlee Pennywell: Gerlene Fee Other Clinician: Referring Alfredia Desanctis: Treating Rorey Hodges/Extender: Hubert Azure in Treatment: 19 Wound Status Wound Number: 5 Primary Etiology: Skin Tear Wound Location: Right Elbow Wound Status: Open Wounding Event: Trauma Comorbid History: Asthma, Hypertension, Hepatitis A, Rheumatoid Arthritis Date Acquired: 03/22/2022 Weeks Of Treatment: 0 Clustered Wound: No Photos Wound Measurements Length: (cm) 1.6 Width: (cm) 3.5 Depth: (cm) 0.1 Area: (cm) 4.398 Volume: (cm) 0.44 % Reduction in Area: 0% % Reduction in Volume: 0% Epithelialization:  None Tunneling: No Undermining: No Wound Description Classification: Full Thickness Without Exposed Support Structures Wound Margin: Distinct, outline attached Exudate Amount: Medium Exudate Type: Serosanguineous Exudate Color: red, brown Foul Odor After Cleansing: No Slough/Fibrino  Yes Wound Bed Granulation Amount: Large (67-100%) Exposed Structure Granulation Quality: Pink Fascia Exposed: No Necrotic Amount: Small (1-33%) Fat Layer (Subcutaneous Tissue) Exposed: Yes Necrotic Quality: Adherent Slough Tendon Exposed: No Muscle Exposed: No Joint Exposed: No Bone Exposed: No Treatment Notes Wound #5 (Elbow) Wound Laterality: Right Cleanser Soap and Water Discharge Instruction: May shower and wash wound with dial antibacterial soap and water prior to dressing change. Wound Cleanser Discharge Instruction: Cleanse the wound with wound cleanser prior to applying a clean dressing using gauze sponges, not tissue or cotton balls. Peri-Wound Care Skin Prep Discharge Instruction: Use skin prep as directed Topical bacitracin Discharge Instruction: bacitracin ointment daily to wound. Primary Dressing Secondary Dressing Zetuvit Plus Silicone Border Dressing 4x4 (in/in) Discharge Instruction: Apply silicone border over primary dressing as directed. Secured With Compression Wrap Compression Stockings Environmental education officer) Signed: 03/24/2022 5:10:40 PM By: Deon Pilling RN, BSN Entered By: Deon Pilling on 03/24/2022 08:56:20 -------------------------------------------------------------------------------- Vitals Details Patient Name: Date of Service: Brian Leon, RO NN P. 03/24/2022 8:15 A M Medical Record Number: [id number] Patient Account Number: [id number] Date of Birth/Sex: Treating RN: 1946-08-04 (76 y.o. [name] Primary Care Chaitra Mast: Gerlene Fee Other Clinician: Referring Marcanthony Sleight: Treating Mckinley Adelstein/Extender: Hubert Azure in Treatment: 19 Vital Signs Time Taken: 08:20 Temperature (F): 97.8 Height (in): 68 Pulse (bpm): 67 Weight (lbs): 140 Respiratory Rate (breaths/min): 20 Body Mass Index (BMI): 21.3 Blood Pressure (mmHg): 156/77 Reference Range: 80 - 120 mg / dl Electronic Signature(s) Signed: 03/24/2022 5:10:40 PM By: Deon Pilling RN, BSN Entered By: Deon Pilling on 03/24/2022 08:22:37

## 2022-03-29 ENCOUNTER — Other Ambulatory Visit: Payer: Self-pay | Admitting: *Deleted

## 2022-03-29 DIAGNOSIS — E559 Vitamin D deficiency, unspecified: Secondary | ICD-10-CM | POA: Diagnosis not present

## 2022-03-29 DIAGNOSIS — Z79899 Other long term (current) drug therapy: Secondary | ICD-10-CM

## 2022-03-29 DIAGNOSIS — M810 Age-related osteoporosis without current pathological fracture: Secondary | ICD-10-CM

## 2022-03-30 ENCOUNTER — Other Ambulatory Visit (HOSPITAL_COMMUNITY): Payer: Self-pay

## 2022-03-30 DIAGNOSIS — Z79899 Other long term (current) drug therapy: Secondary | ICD-10-CM | POA: Diagnosis not present

## 2022-03-30 DIAGNOSIS — Z85828 Personal history of other malignant neoplasm of skin: Secondary | ICD-10-CM | POA: Diagnosis not present

## 2022-03-30 DIAGNOSIS — L570 Actinic keratosis: Secondary | ICD-10-CM | POA: Diagnosis not present

## 2022-03-30 DIAGNOSIS — L2081 Atopic neurodermatitis: Secondary | ICD-10-CM | POA: Diagnosis not present

## 2022-03-30 LAB — COMPREHENSIVE METABOLIC PANEL
AG Ratio: 1.8 (calc) (ref 1.0–2.5)
ALT: 14 U/L (ref 9–46)
AST: 11 U/L (ref 10–35)
Albumin: 3.9 g/dL (ref 3.6–5.1)
Alkaline phosphatase (APISO): 75 U/L (ref 35–144)
BUN: 16 mg/dL (ref 7–25)
CO2: 23 mmol/L (ref 20–32)
Calcium: 9.5 mg/dL (ref 8.6–10.3)
Chloride: 109 mmol/L (ref 98–110)
Creat: 1.19 mg/dL (ref 0.70–1.28)
Globulin: 2.2 g/dL (calc) (ref 1.9–3.7)
Glucose, Bld: 111 mg/dL — ABNORMAL HIGH (ref 65–99)
Potassium: 4.5 mmol/L (ref 3.5–5.3)
Sodium: 140 mmol/L (ref 135–146)
Total Bilirubin: 0.4 mg/dL (ref 0.2–1.2)
Total Protein: 6.1 g/dL (ref 6.1–8.1)

## 2022-03-30 LAB — CBC WITH DIFFERENTIAL/PLATELET
Absolute Monocytes: 779 cells/uL (ref 200–950)
Basophils Absolute: 40 cells/uL (ref 0–200)
Basophils Relative: 0.3 %
Eosinophils Absolute: 277 cells/uL (ref 15–500)
Eosinophils Relative: 2.1 %
HCT: 34.3 % — ABNORMAL LOW (ref 38.5–50.0)
Hemoglobin: 11.3 g/dL — ABNORMAL LOW (ref 13.2–17.1)
Lymphs Abs: 1043 cells/uL (ref 850–3900)
MCH: 33.1 pg — ABNORMAL HIGH (ref 27.0–33.0)
MCHC: 32.9 g/dL (ref 32.0–36.0)
MCV: 100.6 fL — ABNORMAL HIGH (ref 80.0–100.0)
MPV: 9.4 fL (ref 7.5–12.5)
Monocytes Relative: 5.9 %
Neutro Abs: 11062 cells/uL — ABNORMAL HIGH (ref 1500–7800)
Neutrophils Relative %: 83.8 %
Platelets: 388 10*3/uL (ref 140–400)
RBC: 3.41 10*6/uL — ABNORMAL LOW (ref 4.20–5.80)
RDW: 12.2 % (ref 11.0–15.0)
Total Lymphocyte: 7.9 %
WBC: 13.2 10*3/uL — ABNORMAL HIGH (ref 3.8–10.8)

## 2022-03-30 LAB — VITAMIN D 25 HYDROXY (VIT D DEFICIENCY, FRACTURES): Vit D, 25-Hydroxy: 43 ng/mL (ref 30–100)

## 2022-03-30 MED ORDER — DENOSUMAB 60 MG/ML ~~LOC~~ SOSY
60.0000 mg | PREFILLED_SYRINGE | SUBCUTANEOUS | 0 refills | Status: DC
  Filled 2022-03-30: qty 1, 180d supply, fill #0

## 2022-03-30 NOTE — Telephone Encounter (Signed)
Labs for Prolia drawn on 03/29/22. CMP wnl. CBC shows elevated WBC and neutrophils.  Rx for Prolia sent to [facility] to be couriered to clinic by 04/08/22.  [name], PharmD, MPH, BCPS, CPP Clinical Pharmacist (Rheumatology and Pulmonology)

## 2022-03-30 NOTE — Progress Notes (Signed)
DeMent is normal, CMP is normal.  White cell count is elevated at 13.2 due to prednisone use.  Hemoglobin is low.  Please forward results to his PCP.

## 2022-03-31 ENCOUNTER — Other Ambulatory Visit (HOSPITAL_COMMUNITY): Payer: Self-pay

## 2022-03-31 ENCOUNTER — Encounter: Payer: Self-pay | Admitting: Family Medicine

## 2022-03-31 ENCOUNTER — Ambulatory Visit (INDEPENDENT_AMBULATORY_CARE_PROVIDER_SITE_OTHER): Payer: Medicare Other | Admitting: Family Medicine

## 2022-03-31 ENCOUNTER — Encounter (HOSPITAL_BASED_OUTPATIENT_CLINIC_OR_DEPARTMENT_OTHER): Payer: Medicare Other | Admitting: Internal Medicine

## 2022-03-31 VITALS — BP 102/60 | HR 86 | Wt 135.0 lb

## 2022-03-31 DIAGNOSIS — L97912 Non-pressure chronic ulcer of unspecified part of right lower leg with fat layer exposed: Secondary | ICD-10-CM

## 2022-03-31 DIAGNOSIS — R197 Diarrhea, unspecified: Secondary | ICD-10-CM | POA: Diagnosis not present

## 2022-03-31 DIAGNOSIS — R350 Frequency of micturition: Secondary | ICD-10-CM | POA: Diagnosis not present

## 2022-03-31 DIAGNOSIS — R110 Nausea: Secondary | ICD-10-CM

## 2022-03-31 DIAGNOSIS — L97822 Non-pressure chronic ulcer of other part of left lower leg with fat layer exposed: Secondary | ICD-10-CM | POA: Diagnosis not present

## 2022-03-31 LAB — POCT URINALYSIS DIP (MANUAL ENTRY)
Bilirubin, UA: NEGATIVE
Glucose, UA: NEGATIVE mg/dL
Ketones, POC UA: NEGATIVE mg/dL
Leukocytes, UA: NEGATIVE
Nitrite, UA: NEGATIVE
Spec Grav, UA: 1.02 (ref 1.010–1.025)
Urobilinogen, UA: 0.2 E.U./dL
pH, UA: 5.5 (ref 5.0–8.0)

## 2022-03-31 LAB — POCT UA - MICROSCOPIC ONLY: WBC, Ur, HPF, POC: NONE SEEN (ref 0–5)

## 2022-03-31 MED ORDER — DICYCLOMINE HCL 10 MG PO CAPS: 10.0000 mg | ORAL_CAPSULE | Freq: Three times a day (TID) | ORAL | 0 refills | Status: DC

## 2022-03-31 MED ORDER — ONDANSETRON HCL 4 MG PO TABS: 4.0000 mg | ORAL_TABLET | Freq: Three times a day (TID) | ORAL | 0 refills | Status: DC | PRN

## 2022-03-31 NOTE — Patient Instructions (Addendum)
It was a pleasure to see you today!  We will get some labs today.  If they are abnormal or we need to do something about them, I will call you.  If they are normal, I will send you a message on MyChart (if it is active) or a letter in the mail.  If you don't hear from [place] in 2 weeks, please call the office  [phone]. Drink a sports drink a few sips every 15 minutes, goal to get urine light yellow to clear If you cannot drink any fluids by mouth, have large bloody bowel movements, have a fever above 100.4*F, feeling worse, you can call for same day appt here or go to emergency department Follow up next week if not better, or two weeks  if somewhat better but not all the way better    Be Well,  Dr. Chauncey Reading

## 2022-03-31 NOTE — Telephone Encounter (Signed)
ATC patient for Prolia appt scheduling and confirm he is not being actively treated for infection (per chart review may have open wound that he is receiving antibiotics for). Left VM requesting return call.   [name], PharmD, MPH, BCPS, CPP Clinical Pharmacist (Rheumatology and Pulmonology)

## 2022-03-31 NOTE — Progress Notes (Signed)
Brian Leon ([id number]) Visit Report for 03/31/2022 Chief Complaint Document Details Patient Name: Date of Service: Brian Leon, [name] 03/31/2022 8:00 A M Medical Record Number: [id number] Patient Account Number: [id number] Date of Birth/Sex: Treating RN: 01-03-1946 (76 y.o. Brian Leon Primary Care Provider: Gerlene Leon Other Clinician: Referring Provider: Treating Provider/Extender: Brian Leon in Treatment: 20 Information Obtained from: Patient Chief Complaint 11/11/2021; Left lower extremity wound due to trauma 02/03/2022; bilateral lower extremity wounds due to trauma Electronic Signature(s) Signed: 03/31/2022 10:33:21 AM By: Brian Shan DO Entered By: Brian Leon on 03/31/2022 09:36:59 -------------------------------------------------------------------------------- Debridement Details Patient Name: Date of Service: Brian Leon, RO NN P. 03/31/2022 8:00 A M Medical Record Number: [id number] Patient Account Number: [id number] Date of Birth/Sex: Treating RN: Sep 29, 1946 (76 y.o. Brian Leon, Brian Leon Primary Care Provider: Gerlene Leon Other Clinician: Referring Provider: Treating Provider/Extender: Brian Leon in Treatment: 20 Debridement Performed for Assessment: Wound #2 Left,Anterior Lower Leg Performed By: Physician Brian Shan, DO Debridement Type: Debridement Level of Consciousness (Pre-procedure): Awake and Alert Pre-procedure Verification/Time Out Yes - 08:58 Taken: Start Time: 08:59 Pain Control: Lidocaine 5% topical ointment T Area Debrided (L x W): otal 1.1 (cm) x 1 (cm) = 1.1 (cm) Tissue and other material debrided: Viable, Non-Viable, Slough, Subcutaneous, Slough Level: Skin/Subcutaneous Tissue Debridement Description: Excisional Instrument: Curette Bleeding: Minimum Hemostasis Achieved: Pressure End Time: 09:03 Procedural Pain: 0 Post Procedural Pain: 0 Response to  Treatment: Procedure was tolerated well Level of Consciousness (Post- Awake and Alert procedure): Post Debridement Measurements of Total Wound Length: (cm) 1.1 Width: (cm) 1 Depth: (cm) 0.1 Volume: (cm) 0.086 Character of Wound/Ulcer Post Debridement: Improved Post Procedure Diagnosis Same as Pre-procedure Electronic Signature(s) Signed: 03/31/2022 10:33:21 AM By: Brian Shan DO Signed: 03/31/2022 5:00:34 PM By: Brian Pilling RN, BSN Entered By: Brian Leon on 03/31/2022 09:04:26 -------------------------------------------------------------------------------- Debridement Details Patient Name: Date of Service: Brian Leon, RO NN P. 03/31/2022 8:00 A M Medical Record Number: [id number] Patient Account Number: [id number] Date of Birth/Sex: Treating RN: 11/07/1945 (76 y.o. Brian Leon, Brian Leon Primary Care Provider: Gerlene Leon Other Clinician: Referring Provider: Treating Provider/Extender: Brian Leon in Treatment: 20 Debridement Performed for Assessment: Wound #3 Right,Medial Lower Leg Performed By: Physician Brian Shan, DO Debridement Type: Debridement Level of Consciousness (Pre-procedure): Awake and Alert Pre-procedure Verification/Time Out Yes - 08:58 Taken: Start Time: 08:59 Pain Control: Lidocaine 5% topical ointment T Area Debrided (L x W): otal 2.1 (cm) x 1.7 (cm) = 3.57 (cm) Tissue and other material debrided: Viable, Non-Viable, Slough, Subcutaneous, Slough, Hyper-granulation Level: Skin/Subcutaneous Tissue Debridement Description: Excisional Instrument: Curette Bleeding: Minimum Hemostasis Achieved: Silver Nitrate End Time: 09:03 Procedural Pain: 0 Post Procedural Pain: 0 Response to Treatment: Procedure was tolerated well Level of Consciousness (Post- Awake and Alert procedure): Post Debridement Measurements of Total Wound Length: (cm) 2.1 Width: (cm) 1.7 Depth: (cm) 0.1 Volume: (cm) 0.28 Character of  Wound/Ulcer Post Debridement: Improved Post Procedure Diagnosis Same as Pre-procedure Electronic Signature(s) Signed: 03/31/2022 10:33:21 AM By: Brian Shan DO Signed: 03/31/2022 5:00:34 PM By: Brian Pilling RN, BSN Entered By: Brian Leon on 03/31/2022 09:04:46 -------------------------------------------------------------------------------- Debridement Details Patient Name: Date of Service: Brian Leon, RO NN P. 03/31/2022 8:00 A M Medical Record Number: [id number] Patient Account Number: [id number] Date of Birth/Sex: Treating RN: Jun 08, 1946 (76 y.o. Brian Leon Primary Care Provider: Other Clinician: Gerlene Leon Referring Provider: Treating Provider/Extender: Brian Leon in Treatment: 20 Debridement Performed for Assessment:  Wound #4 Right,Anterior Lower Leg Performed By: Physician Brian Shan, DO Debridement Type: Debridement Level of Consciousness (Pre-procedure): Awake and Alert Pre-procedure Verification/Time Out Yes - 08:58 Taken: Start Time: 08:59 Pain Control: Lidocaine 5% topical ointment T Area Debrided (L x W): otal 1.3 (cm) x 1.7 (cm) = 2.21 (cm) Tissue and other material debrided: Viable, Non-Viable, Slough, Subcutaneous, Slough Level: Skin/Subcutaneous Tissue Debridement Description: Excisional Instrument: Curette Bleeding: Minimum Hemostasis Achieved: Pressure End Time: 09:03 Procedural Pain: 0 Post Procedural Pain: 0 Response to Treatment: Procedure was tolerated well Level of Consciousness (Post- Awake and Alert procedure): Post Debridement Measurements of Total Wound Length: (cm) 1.3 Width: (cm) 1.7 Depth: (cm) 0.1 Volume: (cm) 0.174 Character of Wound/Ulcer Post Debridement: Improved Post Procedure Diagnosis Same as Pre-procedure Electronic Signature(s) Signed: 03/31/2022 10:33:21 AM By: Brian Shan DO Signed: 03/31/2022 5:00:34 PM By: Brian Pilling RN, BSN Entered By: Brian Leon on  03/31/2022 09:05:02 -------------------------------------------------------------------------------- HPI Details Patient Name: Date of Service: Brian Leon, RO NN P. 03/31/2022 8:00 A M Medical Record Number: [id number] Patient Account Number: [id number] Date of Birth/Sex: Treating RN: 1945-10-15 (76 y.o. Brian Leon Primary Care Provider: Gerlene Leon Other Clinician: Referring Provider: Treating Provider/Extender: Brian Leon in Treatment: 20 History of Present Illness HPI Description: Admission 11/11/2021 Brian Leon is a 76 year old male with a past medical history of polymyalgia rheumatica that presents to the clinic for a 5-monthhistory of nonhealing wound to the left lower extremity. He states that on Halloween he hit an object and created a wound that has not healed. He states he has seen dermatology and his primary care physician for this issue. On one occasion he had an UHaematologist He has also used Xeroform. It does not sound like he uses any kind of wound dressing consistently. He currently reports chronic pain to the wound site. He denies systemic signs of infection. He denies purulent drainage. 2/16; patient presents for follow-up. He has been using Hydrofera Blue and gentamicin ointment to the wound bed without issues. He reports improvement in wound healing. He currently denies signs of infection. 2/23; patient presents for follow-up. He continues to use Hydrofera Blue and gentamicin ointment to the wound bed without issues. He denies signs of infection. He reports improvement in his chronic pain to the wound bed. 3/2; patient presents for follow-up. He has been using Hydrofera Blue and gentamicin to the wound bed without issues. [place] He currently denies signs of infection. 3/24; patient presents for follow-up. [place] He enjoyed his  trip. He reports no issues today. He has been using Hydrofera Blue and gentamicin ointment to the wound beds without issues. 4/7; patient presents for follow-up. He has been using Hydrofera Blue to the wound bed without issues. He reports that his wound is healed. 02/03/2022; patient was discharged 1 month ago with a closed wound to the left lower extremity. Unfortunately over the past month he has developed 3 new wounds he states was caused by hitting his legs against different objects. He has been keeping the areas covered with DuoDERM. He reports mild tenderness to the right lower extremity wound bed with increased redness. He denies purulent drainage. He states he has his previous wound care supplies of Hydrofera Blue and gentamicin ointment. 5/11; patient presents for follow-up. He had a PCR culture done at last clinic visit that showed high levels of  Staph aureus and viridans and low levels of coagulase-negative staph. I started him on Augmentin at last clinic visit but he reports continued pain and erythema to the wound sites. He has been using gentamicin ointment and Hydrofera Blue with dressing changes. He also presents with a 100.2 temp cough and diarrhea. He has not taken anything for his symptoms. 5/18; patient presents for follow-up. He received his Keystone antibiotics and started using this. Unfortunately he mixed it incorrectly and it turned into a hardened substance. He had to use his refill to start all over again. He has only been using this for 1 day. He states he is combining this correctly now. He completed his course of clindamycin. He denies signs of infection. 5/25; patient presents for follow-up. He has been using Keystone antibiotics with Sorbact. He reports more serous drainage from the left lower extremity. Other than that he has no issues or complaints. He denies signs of infection. 6/1; patient presents for follow-up. We switched to Iodoflex and Keystone antibiotic under  compression therapy at last clinic visit. He tolerated this treatment well. He has no issues or complaints today. He denies signs of infection. Unfortunately he will be out of town for the next 3 weeks. 6/22; patient presents for follow-up. He was in [place] for the past 3 weeks. He has been using compression stockings daily along with [facility] antibiotic and Hydrofera Blue. Unfortunately he developed a skin tear to his right arm after hitting a door. 6/29; patient presents for follow-up. We have been using Keystone antibiotics with PolyMem silver to the lower extremities under 3 layer compression. Apparently he has a skin tear to the lateral Aspect of the right leg that he states was there previously. I had not seen it before. His skin tear on his right arm is well-healing with the use of bacitracin. He has no issues or complaints today. Electronic Signature(s) Signed: 03/31/2022 10:33:21 AM By: Brian Shan DO Entered By: Brian Leon on 03/31/2022 09:43:18 -------------------------------------------------------------------------------- Physical Exam Details Patient Name: Date of Service: Brian Leon, RO NN P. 03/31/2022 8:00 A M Medical Record Number: [id number] Patient Account Number: [id number] Date of Birth/Sex: Treating RN: Feb 03, 1946 (76 y.o. Brian Leon Primary Care Provider: Gerlene Leon Other Clinician: Referring Provider: Treating Provider/Extender: Brian Leon in Treatment: 20 Constitutional respirations regular, non-labored and within target range for patient.. Cardiovascular 2+ dorsalis pedis/posterior tibialis pulses. Psychiatric pleasant and cooperative. Notes Multiple open wounds to the lower extremities bilaterally with mostly granulation tissue and some nonviable tissue present. Right arm: Open wound with granulation tissue throughout. No signs of surrounding soft tissue infection to any of the wound beds. Electronic  Signature(s) Signed: 03/31/2022 10:33:21 AM By: Brian Shan DO Entered By: Brian Leon on 03/31/2022 09:44:22 -------------------------------------------------------------------------------- Physician Orders Details Patient Name: Date of Service: Brian Leon, [place] NN P. 03/31/2022 8:00 A M Medical Record Number: [id number] Patient Account Number: [id number] Date of Birth/Sex: Treating RN: 1946-09-07 (76 y.o. 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 -  Dr. Heber [place] [facility] [place], Room 8 04/07/2022 0815 Thursday Return A ppointment in 2 weeks. - Dr. Heber  and [place], Room 8 04/14/2022 0815 Thursday Return A Bathing/ Shower/ Hygiene May shower with protection but do not get wound dressing(s) wet. Edema Control - Lymphedema / SCD / Other Elevate legs to the level of the heart or above for 30 minutes daily and/or when sitting, a frequency of: - 3-4 times a day throughout the day. Avoid standing for long periods of time. Exercise regularly Moisturize legs daily. - every night before bed. do not get directly on wounds Wound Treatment Wound #2 - Lower Leg Wound Laterality: Left, Anterior Cleanser: Soap and Water 1 x Per Week/30 Days Discharge Instructions: May shower and wash wound with dial antibacterial soap and water prior to dressing  change. Peri-Wound Care: Sween Lotion (Moisturizing lotion) 1 x Per Week/30 Days Discharge Instructions: Apply moisturizing lotion as directed Topical: topical compounding antibiotics 1 x Per Week/30 Days Discharge Instructions: apply directly to wound bed. Prim Dressing: PolyMem Silver Non-Adhesive Dressing, 4.25x4.25 in 1 x Per Week/30 Days ary Discharge Instructions: Apply to wound bed as instructed Secondary Dressing: ABD Pad, 8x10 1 x Per Week/30 Days Discharge Instructions: Apply over primary dressing as directed. Compression Wrap: ThreePress (3 layer compression wrap) 1 x Per Week/30 Days Discharge Instructions: Apply three layer compression as directed. Wound #3 - Lower Leg Wound Laterality: Right, Medial Cleanser: Soap and Water 1 x Per Week/30 Days Discharge Instructions: May shower and wash wound with dial antibacterial soap and water prior to dressing change. Peri-Wound Care: Sween Lotion (Moisturizing lotion) 1 x Per Week/30 Days Discharge Instructions: Apply moisturizing lotion as directed Topical: topical compounding antibiotics 1 x Per Week/30 Days Discharge Instructions: apply directly to wound bed. Prim Dressing: PolyMem Silver Non-Adhesive Dressing, 4.25x4.25 in 1 x Per Week/30 Days ary Discharge Instructions: Apply to wound bed as instructed Secondary Dressing: ABD Pad, 8x10 1 x Per Week/30 Days Discharge Instructions: Apply over primary dressing as directed. Compression Wrap: ThreePress (3 layer compression wrap) 1 x Per Week/30 Days Discharge Instructions: Apply three layer compression as directed. Wound #4 - Lower Leg Wound Laterality: Right, Anterior Cleanser: Soap and Water 1 x Per Week/30 Days Discharge Instructions: May shower and wash wound with dial antibacterial soap and water prior to dressing change. Peri-Wound Care: Sween Lotion (Moisturizing lotion) 1 x Per Week/30 Days Discharge Instructions: Apply moisturizing lotion as directed Topical: topical  compounding antibiotics 1 x Per Week/30 Days Discharge Instructions: apply directly to wound bed. Prim Dressing: PolyMem Silver Non-Adhesive Dressing, 4.25x4.25 in 1 x Per Week/30 Days ary Discharge Instructions: Apply to wound bed as instructed Secondary Dressing: ABD Pad, 8x10 1 x Per Week/30 Days Discharge Instructions: Apply over primary dressing as directed. Compression Wrap: ThreePress (3 layer compression wrap) 1 x Per Week/30 Days Discharge Instructions: Apply three layer compression as directed. Wound #5 - Elbow Wound Laterality: Right Cleanser: Soap and Water 1 x Per Day/30 Days Discharge Instructions: May shower and wash wound with dial antibacterial soap and water prior to dressing change. Cleanser: Wound Cleanser 1 x Per Day/30 Days Discharge Instructions: Cleanse the wound with wound cleanser prior to applying a clean dressing using gauze sponges, not tissue or cotton balls. Peri-Wound Care: Skin Prep (Generic) 1 x Per Day/30 Days Discharge Instructions: Use skin prep as directed Topical: bacitracin 1 x Per Day/30 Days Discharge Instructions: bacitracin ointment daily to wound. Secondary Dressing: Zetuvit Plus Silicone Border Dressing 4x4 (in/in) (Generic) 1 x Per Day/30 Days  Discharge Instructions: Apply silicone border over primary dressing as directed. Wound #6 - Lower Leg Wound Laterality: Right, Lateral Cleanser: Soap and Water 1 x Per Week/30 Days Discharge Instructions: May shower and wash wound with dial antibacterial soap and water prior to dressing change. Peri-Wound Care: Sween Lotion (Moisturizing lotion) 1 x Per Week/30 Days Discharge Instructions: Apply moisturizing lotion as directed Topical: bacitracin 1 x Per Week/30 Days Discharge Instructions: apply to wound bed. Secondary Dressing: ABD Pad, 8x10 1 x Per Week/30 Days Discharge Instructions: Apply over primary dressing as directed. Compression Wrap: ThreePress (3 layer compression wrap) 1 x Per Week/30  Days Discharge Instructions: Apply three layer compression as directed. Electronic Signature(s) Signed: 03/31/2022 10:33:21 AM By: Brian Shan DO Entered By: Brian Leon on 03/31/2022 09:44:31 -------------------------------------------------------------------------------- Problem List Details Patient Name: Date of Service: Brian Leon, RO NN P. 03/31/2022 8:00 A M Medical Record Number: [id number] Patient Account Number: [id number] Date of Birth/Sex: Treating RN: [birth year] (76 y.o. Brian Leon, [name] Primary Care Provider: Gerlene Leon Other Clinician: Referring Provider: Treating Provider/Extender: Brian Leon in Treatment: 20 Active Problems ICD-10 Encounter Encounter Code Description Active Date MDM Diagnosis [phone] Non-pressure chronic ulcer of other part of left lower leg with fat layer exposed2/06/2022 No Yes L97.912 Non-pressure chronic ulcer of unspecified part of right lower leg with fat layer 02/03/2022 No Yes exposed S41.101A Unspecified open wound of right upper arm, initial encounter 03/24/2022 No Yes T14.90XA Injury, unspecified, initial encounter 11/11/2021 No Yes M35.3 Polymyalgia rheumatica 11/11/2021 No Yes I87.2 Venous insufficiency (chronic) (peripheral) 02/24/2022 No Yes Inactive Problems Resolved Problems Electronic Signature(s) Signed: 03/31/2022 10:33:21 AM By: Brian Shan DO Entered By: Brian Leon on 03/31/2022 09:36:46 -------------------------------------------------------------------------------- Progress Note Details Patient Name: Date of Service: Brian Leon, RO NN P. 03/31/2022 8:00 A M Medical Record Number: [id number] Patient Account Number: [id number] Date of Birth/Sex: Treating RN: [birth year] (76 y.o. Brian Leon Primary Care Provider: Gerlene Leon Other Clinician: Referring Provider: Treating Provider/Extender: Brian Leon in Treatment:  20 Subjective Chief Complaint Information obtained from Patient 11/11/2021; Left lower extremity wound due to trauma 02/03/2022; bilateral lower extremity wounds due to trauma History of Present Illness (HPI) Admission 11/11/2021 Mr. Artin Chait is a 76 year old male with a past medical history of polymyalgia rheumatica that presents to the clinic for a 33-monthhistory of nonhealing wound to the left lower extremity. He states that on Halloween he hit an object and created a wound that has not healed. He states he has seen dermatology and his primary care physician for this issue. On one occasion he had an UHaematologist He has also used Xeroform. It does not sound like he uses any kind of wound dressing consistently. He currently reports chronic pain to the wound site. He denies systemic signs of infection. He denies purulent drainage. 2/16; patient presents for follow-up. He has been using Hydrofera Blue and gentamicin ointment to the wound bed without issues. He reports improvement in wound healing. He currently denies signs of infection. 2/23; patient presents for follow-up. He continues to use Hydrofera Blue and gentamicin ointment to the wound bed without issues. He denies signs of infection. He reports improvement in his chronic pain to the wound bed. 3/2; patient presents for follow-up. He has been using Hydrofera Blue and gentamicin to the wound bed without issues. [place] He currently denies signs of infection. 3/24; patient presents for follow-up. He has been  in [place] for the past 3 weeks for vacation. He enjoyed his trip. He reports no issues today. He has been using Hydrofera Blue and gentamicin ointment to the wound beds without issues. 4/7; patient presents for follow-up. He has been using Hydrofera Blue to the wound bed without issues. He reports that his wound is healed. 02/03/2022; patient was discharged 1 month ago with a closed wound to  the left lower extremity. Unfortunately over the past month he has developed 3 new wounds he states was caused by hitting his legs against different objects. He has been keeping the areas covered with DuoDERM. He reports mild tenderness to the right lower extremity wound bed with increased redness. He denies purulent drainage. He states he has his previous wound care supplies of Hydrofera Blue and gentamicin ointment. 5/11; patient presents for follow-up. He had a PCR culture done at last clinic visit that showed high levels of Staph aureus and viridans and low levels of coagulase-negative staph. I started him on Augmentin at last clinic visit but he reports continued pain and erythema to the wound sites. He has been using gentamicin ointment and Hydrofera Blue with dressing changes. He also presents with a 100.2 temp cough and diarrhea. He has not taken anything for his symptoms. 5/18; patient presents for follow-up. He received his Keystone antibiotics and started using this. Unfortunately he mixed it incorrectly and it turned into a hardened substance. He had to use his refill to start all over again. He has only been using this for 1 day. He states he is combining this correctly now. He completed his course of clindamycin. He denies signs of infection. 5/25; patient presents for follow-up. He has been using Keystone antibiotics with Sorbact. He reports more serous drainage from the left lower extremity. Other than that he has no issues or complaints. He denies signs of infection. 6/1; patient presents for follow-up. We switched to Iodoflex and Keystone antibiotic under compression therapy at last clinic visit. He tolerated this treatment well. He has no issues or complaints today. He denies signs of infection. Unfortunately he will be out of town for the next 3 weeks. 6/22; patient presents for follow-up. He was in [place] for the past 3 weeks. He has been using compression stockings daily along  with [facility] antibiotic and Hydrofera Blue. Unfortunately he developed a skin tear to his right arm after hitting a door. 6/29; patient presents for follow-up. We have been using Keystone antibiotics with PolyMem silver to the lower extremities under 3 layer compression. Apparently he has a skin tear to the lateral Aspect of the right leg that he states was there previously. I had not seen it before. His skin tear on his right arm is well-healing with the use of bacitracin. He has no issues or complaints today. Patient History Information obtained from Chart. Family History Cancer - Mother, No family history of Diabetes, Heart Disease, Hypertension, Kidney Disease, Lung Disease, Seizures, Stroke, Thyroid Problems, Tuberculosis. Social History Former smoker - quit 40 years ago, Marital Status - Married, Alcohol Use - Never, Drug Use - Current History - marijuana, Caffeine Use - Rarely. Medical History Eyes Denies history of Cataracts, Glaucoma, Optic Neuritis Ear/Nose/Mouth/Throat Denies history of Chronic sinus problems/congestion, Middle ear problems Hematologic/Lymphatic Denies history of Anemia, Hemophilia, Human Immunodeficiency Virus, Lymphedema, Sickle Cell Disease Respiratory Patient has history of Asthma Denies history of Aspiration, Chronic Obstructive Pulmonary Disease (COPD), Pneumothorax, Sleep Apnea, Tuberculosis Cardiovascular Patient has history of Hypertension Denies history of Angina, Arrhythmia, Congestive  Heart Failure, Coronary Artery Disease, Deep Vein Thrombosis, Hypotension, Myocardial Infarction, Peripheral Arterial Disease, Peripheral Venous Disease, Phlebitis, Vasculitis Gastrointestinal Patient has history of Hepatitis A Denies history of Cirrhosis , Colitis, Crohnoos, Hepatitis B, Hepatitis C Endocrine Denies history of Type I Diabetes, Type II Diabetes Genitourinary Denies history of End Stage Renal Disease Immunological Denies history of Lupus  Erythematosus, Raynaudoos, Scleroderma Integumentary (Skin) Denies history of History of Burn Musculoskeletal Patient has history of Rheumatoid Arthritis Denies history of Gout, Osteoarthritis, Osteomyelitis Psychiatric Denies history of Anorexia/bulimia, Confinement Anxiety Hospitalization/Surgery History - 5 years ago Renal Cell carcinoma. 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 Vitals Time Taken: 8:10 AM, Height: 68 in, Weight: 140 lbs, BMI: 21.3, Temperature: 98 F, Pulse: 84 bpm, Respiratory Rate: 20 breaths/min, Blood Pressure: 144/79 mmHg. Cardiovascular 2+ dorsalis pedis/posterior tibialis pulses. Psychiatric pleasant and cooperative. General Notes: Multiple open wounds to the lower extremities bilaterally with mostly granulation tissue and some nonviable tissue present. Right arm: Open wound with granulation tissue throughout. No signs of surrounding soft tissue infection to any of the wound beds. Integumentary (Hair, Skin) Wound #2 status is Open. Original cause of wound was Trauma. The date acquired was: 01/04/2022. The wound has been in treatment 8 weeks. The wound is located on the Left,Anterior Lower Leg. The wound measures 1.1cm length x 1cm width x 0.1cm depth; 0.864cm^2 area and 0.086cm^3 volume. There is Fat Layer (Subcutaneous Tissue) exposed. There is no tunneling or undermining noted. There is a medium amount of serosanguineous drainage  noted. The wound margin is distinct with the outline attached to the wound base. There is large (67-100%) pink granulation within the wound bed. There is a small (1-33%) amount of necrotic tissue within the wound bed including Adherent Slough. Wound #3 status is Open. Original cause of wound was Trauma. The date acquired was: 01/24/2022. The wound has been in treatment 8 weeks. The wound is located on the Right,Medial Lower Leg. The wound measures 2.1cm length x 1.7cm width x 0.1cm depth; 2.804cm^2 area and 0.28cm^3 volume. There is Fat Layer (Subcutaneous Tissue) exposed. There is no tunneling or undermining noted. There is a medium amount of serosanguineous drainage noted. The wound margin is distinct with the outline attached to the wound base. There is large (67-100%) red, pink granulation within the wound bed. There is a small (1-33%) amount of necrotic tissue within the wound bed including Adherent Slough. Wound #4 status is Open. Original cause of wound was Trauma. The date acquired was: 01/04/2022. The wound has been in treatment 8 weeks. The wound is located on the Right,Anterior Lower Leg. The wound measures 1.3cm length x 1.7cm width x 0.1cm depth; 1.736cm^2 area and 0.174cm^3 volume. There is Fat Layer (Subcutaneous Tissue) exposed. There is no tunneling or undermining noted. There is a medium amount of serosanguineous drainage noted. The wound margin is distinct with the outline attached to the wound base. There is medium (34-66%) pink granulation within the wound bed. There is a medium (34-66%) amount of necrotic tissue within the wound bed including Adherent Slough. Wound #5 status is Open. Original cause of wound was Trauma. The date acquired was: 03/22/2022. The  wound has been in treatment 1 weeks. The wound is located on the Right Elbow. The wound measures 1.2cm length x 3.2cm width x 0.1cm depth; 3.016cm^2 area and 0.302cm^3 volume. There is Fat Layer (Subcutaneous Tissue) exposed.  There is no tunneling or undermining noted. There is a medium amount of serosanguineous drainage noted. The wound margin is distinct with the outline attached to the wound base. There is large (67-100%) pink granulation within the wound bed. There is a small (1-33%) amount of necrotic tissue within the wound bed including Adherent Slough. Wound #6 status is Open. Original cause of wound was Laceration. The date acquired was: 03/25/2022. The wound is located on the Right,Lateral Lower Leg. The wound measures 2.7cm length x 0.1cm width x 0.1cm depth; 0.212cm^2 area and 0.021cm^3 volume. There is Fat Layer (Subcutaneous Tissue) exposed. There is no tunneling or undermining noted. There is a medium amount of serosanguineous drainage noted. The wound margin is distinct with the outline attached to the wound base. There is large (67-100%) red granulation within the wound bed. 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 All wounds appear well-healing. I debrided nonviable tissue. I recommended continuing the course with Keystone and PolyMem silver to the lower extremity wounds. Continue bacitracin to the right arm skin tear. Follow- up in 1 week Procedures Wound #2 Pre-procedure diagnosis of Wound #2 is an Abrasion located on the Left,Anterior Lower Leg . There was a Excisional Skin/Subcutaneous Tissue Debridement with a total area of 1.1 sq cm performed by Brian Shan, DO. With the following instrument(s): Curette to remove Viable and Non-Viable tissue/material. Material removed includes  Subcutaneous Tissue and Slough and after achieving pain control using Lidocaine 5% topical ointment. A time out was conducted at 08:58, prior to the start of the procedure. A Minimum amount of bleeding was controlled with Pressure. The procedure was tolerated well with a pain level of 0 throughout and a pain level of 0 following the procedure. Post Debridement Measurements: 1.1cm length x 1cm width x 0.1cm depth; 0.086cm^3 volume. Character of Wound/Ulcer Post Debridement is improved. Post procedure Diagnosis Wound #2: Same as Pre-Procedure Pre-procedure diagnosis of Wound #2 is an Abrasion located on the Left,Anterior Lower Leg . There was a Three Layer Compression Therapy Procedure by Brian Pilling, RN. Post procedure Diagnosis Wound #2: Same as Pre-Procedure Wound #3 Pre-procedure diagnosis of Wound #3 is an Abrasion located on the Right,Medial Lower Leg . There was a Excisional Skin/Subcutaneous Tissue Debridement with a total area of 3.57 sq cm performed by Brian Shan, DO. With the following instrument(s): Curette to remove Viable and Non-Viable tissue/material. Material removed includes Subcutaneous Tissue, Slough, and Hyper-granulation after achieving pain control using Lidocaine 5% topical ointment. A time out was conducted at 08:58, prior to the start of the procedure. A Minimum amount of bleeding was controlled with Silver Nitrate. The procedure was tolerated well with a pain level of 0 throughout and a pain level of 0 following the procedure. Post Debridement Measurements: 2.1cm length x 1.7cm width x 0.1cm depth; 0.28cm^3 volume. Character of Wound/Ulcer Post Debridement is improved. Post procedure Diagnosis Wound #3: Same as Pre-Procedure Pre-procedure diagnosis of Wound #3 is an  Abrasion located on the Right,Medial Lower Leg . There was a Three Layer Compression Therapy Procedure by Brian Pilling, RN. Post procedure Diagnosis Wound #3: Same as Pre-Procedure Wound  #4 Pre-procedure diagnosis of Wound #4 is an Abrasion located on the Right,Anterior Lower Leg . There was a Excisional Skin/Subcutaneous Tissue Debridement with a total area of 2.21 sq cm performed by Brian Shan, DO. With the following instrument(s): Curette to remove Viable and Non-Viable tissue/material. Material removed includes Subcutaneous Tissue and Slough and after achieving pain control using Lidocaine 5% topical ointment. A time out was conducted at 08:58, prior to the start of the procedure. A Minimum amount of bleeding was controlled with Pressure. The procedure was tolerated well with a pain level of 0 throughout and a pain level of 0 following the procedure. Post Debridement Measurements: 1.3cm length x 1.7cm width x 0.1cm depth; 0.174cm^3 volume. Character of Wound/Ulcer Post Debridement is improved. Post procedure Diagnosis Wound #4: Same as Pre-Procedure Pre-procedure diagnosis of Wound #4 is an Abrasion located on the Right,Anterior Lower Leg . There was a Three Layer Compression Therapy Procedure by Brian Pilling, RN. Post procedure Diagnosis Wound #4: Same as Pre-Procedure Wound #6 Pre-procedure diagnosis of Wound #6 is a Skin T located on the Right,Lateral Lower Leg . There was a Three Layer Compression Therapy Procedure by ear Brian Pilling, RN. Post procedure Diagnosis Wound #6: Same as Pre-Procedure Plan Follow-up Appointments: Return Appointment in 1 week. - Dr. Heber [place] and [place], Room 8 04/07/2022 0815 Thursday Return Appointment in 2 weeks. - Dr. Heber [place] and [place], Room 8 04/14/2022 0815 Thursday Bathing/ Shower/ Hygiene: May shower with protection but do not get wound dressing(s) wet. Edema Control - Lymphedema / SCD / Other: Elevate legs to the level of the heart or above for 30 minutes daily and/or when sitting, a frequency of: - 3-4 times a day throughout the day. Avoid standing for long periods of time. Exercise regularly Moisturize legs daily. - every  night before bed. do not get directly on wounds WOUND #2: - Lower Leg Wound Laterality: Left, Anterior Cleanser: Soap and Water 1 x Per Week/30 Days Discharge Instructions: May shower and wash wound with dial antibacterial soap and water prior to dressing change. Peri-Wound Care: Sween Lotion (Moisturizing lotion) 1 x Per Week/30 Days Discharge Instructions: Apply moisturizing lotion as directed Topical: topical compounding antibiotics 1 x Per Week/30 Days Discharge Instructions: apply directly to wound bed. Prim Dressing: PolyMem Silver Non-Adhesive Dressing, 4.25x4.25 in 1 x Per Week/30 Days ary Discharge Instructions: Apply to wound bed as instructed Secondary Dressing: ABD Pad, 8x10 1 x Per Week/30 Days Discharge Instructions: Apply over primary dressing as directed. Com pression Wrap: ThreePress (3 layer compression wrap) 1 x Per Week/30 Days Discharge Instructions: Apply three layer compression as directed. WOUND #3: - Lower Leg Wound Laterality: Right, Medial Cleanser: Soap and Water 1 x Per Week/30 Days Discharge Instructions: May shower and wash wound with dial antibacterial soap and water prior to dressing change. Peri-Wound Care: Sween Lotion (Moisturizing lotion) 1 x Per Week/30 Days Discharge Instructions: Apply moisturizing lotion as directed Topical: topical compounding antibiotics 1 x Per Week/30 Days Discharge Instructions: apply directly to wound bed. Prim Dressing: PolyMem Silver Non-Adhesive Dressing, 4.25x4.25 in 1 x Per Week/30 Days ary Discharge Instructions: Apply to wound bed as instructed Secondary Dressing: ABD Pad, 8x10 1 x Per Week/30 Days Discharge Instructions: Apply over primary dressing as directed. Com pression Wrap: ThreePress (3 layer compression wrap) 1 x Per Week/30 Days Discharge  Instructions: Apply three layer compression as directed. WOUND #4: - Lower Leg Wound Laterality: Right, Anterior Cleanser: Soap and Water 1 x Per Week/30 Days Discharge  Instructions: May shower and wash wound with dial antibacterial soap and water prior to dressing change. Peri-Wound Care: Sween Lotion (Moisturizing lotion) 1 x Per Week/30 Days Discharge Instructions: Apply moisturizing lotion as directed Topical: topical compounding antibiotics 1 x Per Week/30 Days Discharge Instructions: apply directly to wound bed. Prim Dressing: PolyMem Silver Non-Adhesive Dressing, 4.25x4.25 in 1 x Per Week/30 Days ary Discharge Instructions: Apply to wound bed as instructed Secondary Dressing: ABD Pad, 8x10 1 x Per Week/30 Days Discharge Instructions: Apply over primary dressing as directed. Com pression Wrap: ThreePress (3 layer compression wrap) 1 x Per Week/30 Days Discharge Instructions: Apply three layer compression as directed. WOUND #5: - Elbow Wound Laterality: Right Cleanser: Soap and Water 1 x Per Day/30 Days Discharge Instructions: May shower and wash wound with dial antibacterial soap and water prior to dressing change. Cleanser: Wound Cleanser 1 x Per Day/30 Days Discharge Instructions: Cleanse the wound with wound cleanser prior to applying a clean dressing using gauze sponges, not tissue or cotton balls. Peri-Wound Care: Skin Prep (Generic) 1 x Per Day/30 Days Discharge Instructions: Use skin prep as directed Topical: bacitracin 1 x Per Day/30 Days Discharge Instructions: bacitracin ointment daily to wound. Secondary Dressing: Zetuvit Plus Silicone Border Dressing 4x4 (in/in) (Generic) 1 x Per Day/30 Days Discharge Instructions: Apply silicone border over primary dressing as directed. WOUND #6: - Lower Leg Wound Laterality: Right, Lateral Cleanser: Soap and Water 1 x Per Week/30 Days Discharge Instructions: May shower and wash wound with dial antibacterial soap and water prior to dressing change. Peri-Wound Care: Sween Lotion (Moisturizing lotion) 1 x Per Week/30 Days Discharge Instructions: Apply moisturizing lotion as directed Topical: bacitracin 1  x Per Week/30 Days Discharge Instructions: apply to wound bed. Secondary Dressing: ABD Pad, 8x10 1 x Per Week/30 Days Discharge Instructions: Apply over primary dressing as directed. Com pression Wrap: ThreePress (3 layer compression wrap) 1 x Per Week/30 Days Discharge Instructions: Apply three layer compression as directed. 1. In office sharp debridement 2. PolyMem silver and Keystone antibiotic under 3 layer compression to lower extremities bilaterally 3. Bacitracin to the right arm 4. Follow-up in 1 week Electronic Signature(s) Signed: 03/31/2022 10:33:21 AM By: Brian Shan DO Entered By: Brian Leon on 03/31/2022 09:47:54 -------------------------------------------------------------------------------- HxROS Details Patient Name: Date of Service: Brian Leon, RO NN P. 03/31/2022 8:00 A M Medical Record Number: [id number] Patient Account Number: [id number] Date of Birth/Sex: Treating RN: [birth year] (76 y.o. Brian Leon Primary Care Provider: Gerlene Leon Other Clinician: Referring Provider: Treating Provider/Extender: Brian Leon in Treatment: 20 Information Obtained From Chart Eyes Medical History: Negative for: Cataracts; Glaucoma; Optic Neuritis Past Medical History Notes: Blind right eye WEARS PROSTHESIS Ear/Nose/Mouth/Throat Medical History: Negative for: Chronic sinus problems/congestion; Middle ear problems Past Medical History Notes: Allergic rhinitis Hematologic/Lymphatic Medical History: Negative for: Anemia; Hemophilia; Human Immunodeficiency Virus; Lymphedema; Sickle Cell Disease Respiratory Medical History: Positive for: Asthma Negative for: Aspiration; Chronic Obstructive Pulmonary Disease (COPD); Pneumothorax; Sleep Apnea; Tuberculosis Past Medical History Notes: Pneumonia Cardiovascular Medical History: Positive for: Hypertension Negative for: Angina; Arrhythmia; Congestive Heart Failure; Coronary  Artery Disease; Deep Vein Thrombosis; Hypotension; Myocardial Infarction; Peripheral Arterial Disease; Peripheral Venous Disease; Phlebitis; Vasculitis Past Medical History Notes: Thoracic aortic aneurysm Thrombocytosis Gastrointestinal Medical History: Positive for: Hepatitis A Negative for: Cirrhosis ; Colitis; Crohns; Hepatitis B; Hepatitis C Endocrine Medical  History: Negative for: Type I Diabetes; Type II Diabetes Genitourinary Medical History: Negative for: End Stage Renal Disease Past Medical History Notes: 1/3kidney missing per patient-Renal Cell carcinoma Renal Cyst 5 years ago Immunological Medical History: Negative for: Lupus Erythematosus; Raynauds; Scleroderma Integumentary (Skin) Medical History: Negative for: History of Burn Musculoskeletal Medical History: Positive for: Rheumatoid Arthritis Negative for: Gout; Osteoarthritis; Osteomyelitis Past Medical History Notes: 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 Eczema Neurologic Medical History: Past Medical History Notes: Paraureteric diverticulum Oncologic Medical History: Past Medical History Notes: Renal Cell carcinoma- 5 years ago Psychiatric Medical History: Negative for: Anorexia/bulimia; Confinement Anxiety Immunizations Pneumococcal Vaccine: Received Pneumococcal Vaccination: Yes Received Pneumococcal Vaccination On or After 60th Birthday: Yes Implantable Devices No devices added Hospitalization / Surgery History Type of Hospitalization/Surgery 5 years ago Renal Cell carcinoma Family and Social History Cancer: Yes - Mother; Diabetes: No; Heart Disease: No; Hypertension: No; Kidney Disease: No; Lung Disease: No; Seizures: No; Stroke: No; Thyroid Problems: No; Tuberculosis: No; Former smoker - quit 40 years ago; Marital Status - Married; Alcohol Use: Never; Drug Use: Current History - marijuana; Caffeine Use: Rarely; Financial Concerns: No;  Food, Clothing or Shelter Needs: No; Support System Lacking: No; Transportation Concerns: No Electronic Signature(s) Signed: 03/31/2022 10:33:21 AM By: Brian Shan DO Signed: 03/31/2022 5:00:34 PM By: Brian Pilling RN, BSN Entered By: Brian Leon on 03/31/2022 09:43:43 -------------------------------------------------------------------------------- SuperBill Details Patient Name: Date of Service: Brian Leon, RO NN P. 03/31/2022 Medical Record Number: [id number] Patient Account Number: [id number] Date of Birth/Sex: Treating RN: 1946-09-03 (76 y.o. 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 S41.101A Unspecified open wound of right upper arm, initial encounter T14.90XA Injury, unspecified, initial encounter M35.3 Polymyalgia rheumatica I87.2 Venous insufficiency (chronic) (peripheral) Facility Procedures CPT4 Code: [id number] Description: 56433 - DEB SUBQ TISSUE 20 SQ CM/< ICD-10 Diagnosis Description L97.822 Non-pressure chronic ulcer of other part of left lower leg with fat layer expo L97.912 Non-pressure chronic ulcer of unspecified part of right lower leg with fat lay Modifier: sed er exposed Quantity: 1 Physician Procedures : CPT4 Code Description Modifier [id number] - WC PHYS SUBQ TISS 20 SQ CM ICD-10 Diagnosis Description L97.822 Non-pressure chronic ulcer of other part of left lower leg with fat layer exposed L97.912 Non-pressure chronic ulcer of unspecified part of  right lower leg with fat layer exposed Quantity: 1 Electronic Signature(s) Signed: 03/31/2022 10:33:21 AM By: Brian Shan DO Entered  By: Brian Leon on 03/31/2022 09:48:07

## 2022-03-31 NOTE — Progress Notes (Signed)
GARLIN, BATDORF ([id number]) Visit Report for 03/31/2022 Arrival Information Details Patient Name: Date of Service: Brian Leon, [name] 03/31/2022 8:00 A M Medical Record Number: [id number] Patient Account Number: [id number] Date of Birth/Sex: Treating RN: 06-02-1946 (76 y.o. [name], [name] Primary Care Deby Adger: Gerlene Fee Other Clinician: Referring Daja Shuping: Treating Mane Consolo/Extender: Hubert Azure in Treatment: 20 Visit Information History Since Last Visit Added or deleted any medications: No Patient Arrived: Ambulatory Any new allergies or adverse reactions: No Arrival Time: 08:10 Had a fall or experienced change in No Accompanied By: self activities of daily living that may affect Transfer Assistance: None risk of falls: Patient Identification Verified: Yes Signs or symptoms of abuse/neglect since last visito No Secondary Verification Process Completed: Yes Hospitalized since last visit: No Patient Requires Transmission-Based Precautions: No Implantable device outside of the clinic excluding No Patient Has Alerts: Yes cellular tissue based products placed in the center Patient Alerts: 11/02/21 L ABI: 1.37 since last visit: 11/02/2021 L TBI 0.77 Has Dressing in Place as Prescribed: Yes 11/02/2021 R TBI 0.69 Has Compression in Place as Prescribed: Yes 11/02/2021 R ABI 1.3 Pain Present Now: No Electronic Signature(s) Signed: 03/31/2022 5:00:34 PM By: Deon Pilling RN, BSN Entered By: Deon Pilling on 03/31/2022 08:12:02 -------------------------------------------------------------------------------- Compression Therapy Details Patient Name: Date of Service: Brian Leon, RO NN P. 03/31/2022 8:00 A M Medical Record Number: [id number] Patient Account Number: [id number] Date of Birth/Sex: Treating RN: 1946/01/16 (76 y.o. [name] Primary Care Brodric Schauer: Gerlene Fee Other Clinician: Referring Kehinde Bowdish: Treating Makale Pindell/Extender:  Hubert Azure in Treatment: 20 Compression Therapy Performed for Wound Assessment: Wound #2 Left,Anterior Lower Leg Performed By: Clinician Deon Pilling, RN Compression Type: Three Layer Post Procedure Diagnosis Same as Pre-procedure Electronic Signature(s) Signed: 03/31/2022 5:00:34 PM By: Deon Pilling RN, BSN Entered By: Deon Pilling on 03/31/2022 09:05:14 -------------------------------------------------------------------------------- Compression Therapy Details Patient Name: Date of Service: Brian Leon, RO NN P. 03/31/2022 8:00 A M Medical Record Number: [id number] Patient Account Number: [id number] Date of Birth/Sex: Treating RN: 1946/02/13 (76 y.o. [name] Primary Care Tanajah Boulter: Gerlene Fee Other Clinician: Referring Lyana Asbill: Treating Darrill Vreeland/Extender: Hubert Azure in Treatment: 20 Compression Therapy Performed for Wound Assessment: Wound #3 Right,Medial Lower Leg Performed By: Clinician Deon Pilling, RN Compression Type: Three Layer Post Procedure Diagnosis Same as Pre-procedure Electronic Signature(s) Signed: 03/31/2022 5:00:34 PM By: Deon Pilling RN, BSN Entered By: Deon Pilling on 03/31/2022 09:05:14 -------------------------------------------------------------------------------- Compression Therapy Details Patient Name: Date of Service: Brian Leon, RO NN P. 03/31/2022 8:00 A M Medical Record Number: [id number] Patient Account Number: [id number] Date of Birth/Sex: Treating RN: 10-17-1945 (76 y.o. [name] Primary Care Waneta Fitting: Gerlene Fee Other Clinician: Referring Devonne Kitchen: Treating Khian Remo/Extender: Hubert Azure in Treatment: 20 Compression Therapy Performed for Wound Assessment: Wound #4 Right,Anterior Lower Leg Performed By: Clinician Deon Pilling, RN Compression Type: Three Layer Post Procedure Diagnosis Same as  Pre-procedure Electronic Signature(s) Signed: 03/31/2022 5:00:34 PM By: Deon Pilling RN, BSN Entered By: Deon Pilling on 03/31/2022 09:05:15 -------------------------------------------------------------------------------- Compression Therapy Details Patient Name: Date of Service: Brian Leon, RO NN P. 03/31/2022 8:00 A M Medical Record Number: [id number] Patient Account Number: [id number] Date of Birth/Sex: Treating RN: 12/13/1945 (76 y.o. [name] Primary Care Ehan Freas: Gerlene Fee Other Clinician: Referring Abdirizak Richison: Treating Osa Campoli/Extender: Hubert Azure in Treatment: 20 Compression Therapy Performed for Wound Assessment: Wound #6 Right,Lateral Lower Leg Performed By: Clinician Deon Pilling, RN  Compression Type: Three Layer Post Procedure Diagnosis Same as Pre-procedure Electronic Signature(s) Signed: 03/31/2022 5:00:34 PM By: Deon Pilling RN, BSN Entered By: Deon Pilling on 03/31/2022 09:05:15 -------------------------------------------------------------------------------- Encounter Discharge Information Details Patient Name: Date of Service: Brian Leon, RO NN P. 03/31/2022 8:00 A M Medical Record Number: [id number] Patient Account Number: [id number] Date of Birth/Sex: Treating RN: [birth year] (76 y.o. [name] Primary Care Yarden Manuelito: Gerlene Fee Other Clinician: Referring Lanelle Lindo: Treating Kemberly Taves/Extender: Hubert Azure in Treatment: 20 Encounter Discharge Information Items Post Procedure Vitals Discharge Condition: Stable Temperature (F): 98 Ambulatory Status: Ambulatory Pulse (bpm): 84 Discharge Destination: Home Respiratory Rate (breaths/min): 20 Transportation: Private Auto Blood Pressure (mmHg): 144/79 Accompanied By: self Schedule Follow-up Appointment: Yes Clinical Summary of Care: Electronic Signature(s) Signed: 03/31/2022 5:00:34 PM By: Deon Pilling RN, BSN Entered  By: Deon Pilling on 03/31/2022 09:07:43 -------------------------------------------------------------------------------- Lower Extremity Assessment Details Patient Name: Date of Service: Brian Leon, RO NN P. 03/31/2022 8:00 A M Medical Record Number: [id number] Patient Account Number: [id number] Date of Birth/Sex: Treating RN: 14-Jul-1946 (76 y.o. [name] Primary Care Ichelle Harral: Gerlene Fee Other Clinician: Referring Benjamen Koelling: Treating Kendria Halberg/Extender: Hubert Azure in Treatment: 20 Edema Assessment Assessed: [name]: Yes] [name]: Yes] Edema: [Left: No] [Right: No] Calf Left: Right: Point of Measurement: 37 cm From Medial Instep 29 cm 29 cm Ankle Left: Right: Point of Measurement: 12 cm From Medial Instep 19 cm 19 cm Vascular Assessment Pulses: Dorsalis Pedis Palpable: [Left:Yes] [Right:Yes] Electronic Signature(s) Signed: 03/31/2022 5:00:34 PM By: Deon Pilling RN, BSN Entered By: Deon Pilling on 03/31/2022 08:27:49 -------------------------------------------------------------------------------- Multi Wound Chart Details Patient Name: Date of Service: Brian Leon, RO NN P. 03/31/2022 8:00 A M Medical Record Number: [id number] Patient Account Number: [id number] Date of Birth/Sex: Treating RN: 04-13-1946 (76 y.o. [name] Primary Care Taylan Marez: Gerlene Fee Other Clinician: Referring Corianne Buccellato: Treating Tyreck Bell/Extender: Hubert Azure in Treatment: 20 Vital Signs Height(in): 68 Pulse(bpm): 39 Weight(lbs): 140 Blood Pressure(mmHg): 144/79 Body Mass Index(BMI): 21.3 Temperature(F): 98 Respiratory Rate(breaths/min): 20 Photos: Left, Anterior Lower Leg Right, Medial Lower Leg Right, Anterior Lower Leg Wound Location: Trauma Trauma Trauma Wounding Event: Abrasion Abrasion Abrasion Primary Etiology: Asthma, Hypertension, Hepatitis A, Asthma, Hypertension, Hepatitis A, Asthma,  Hypertension, Hepatitis A, Comorbid History: Rheumatoid Arthritis Rheumatoid Arthritis Rheumatoid Arthritis 01/04/2022 01/24/2022 01/04/2022 Date Acquired: _0 Weeks of Treatment: Open Open Open Wound Status: No No No Wound Recurrence: 1.1x1x0.1 2.1x1.7x0.1 1.3x1.7x0.1 Measurements L x W x D (cm) 0.864 2.804 1.736 A (cm) : rea 0.086 0.28 0.174 Volume (cm) : -103.80% 77.30% -426.10% % Reduction in A rea: -104.80% 77.40% -427.30% % Reduction in Volume: Full Thickness Without Exposed Full Thickness Without Exposed Full Thickness Without Exposed Classification: Support Structures Support Structures Support Structures Medium Medium Medium Exudate A mount: Serosanguineous Serosanguineous Serosanguineous Exudate Type: red, brown red, brown red, brown Exudate Color: Distinct, outline attached Distinct, outline attached Distinct, outline attached Wound Margin: Large (67-100%) Large (67-100%) Medium (34-66%) Granulation A mount: Pink Red, Pink Pink Granulation Quality: Small (1-33%) Small (1-33%) Medium (34-66%) Necrotic A mount: Fat Layer (Subcutaneous Tissue): Yes Fat Layer (Subcutaneous Tissue): Yes Fat Layer (Subcutaneous Tissue): Yes Exposed Structures: Fascia: No Fascia: No Fascia: No Tendon: No Tendon: No Tendon: No Muscle: No Muscle: No Muscle: No Joint: No Joint: No Joint: No Bone: No Bone: No Bone: No Medium (34-66%) Medium (34-66%) Small (1-33%) Epithelialization: Debridement - Excisional Debridement - Excisional Debridement - Excisional Debridement: Pre-procedure Verification/Time Out 08:58 08:58  08:58 Taken: Lidocaine 5% topical ointment Lidocaine 5% topical ointment Lidocaine 5% topical ointment Pain Control: Subcutaneous, Slough Subcutaneous, Slough Subcutaneous, Slough Tissue Debrided: Skin/Subcutaneous Tissue Skin/Subcutaneous Tissue Skin/Subcutaneous Tissue Level: 1.1 3.57 2.21 Debridement A (sq cm): rea Curette Curette  Curette Instrument: Minimum Minimum Minimum Bleeding: Pressure Silver Nitrate Pressure Hemostasis A chieved: 0 0 0 Procedural Pain: 0 0 0 Post Procedural Pain: Procedure was tolerated well Procedure was tolerated well Procedure was tolerated well Debridement Treatment Response: 1.1x1x0.1 2.1x1.7x0.1 1.3x1.7x0.1 Post Debridement Measurements L x W x D (cm) 0.086 0.28 0.174 Post Debridement Volume: (cm) Compression Therapy Compression Therapy Compression Therapy Procedures Performed: Debridement Debridement Debridement Wound Number: 5 6 N/A Photos: N/A Right Elbow Right, Lateral Lower Leg N/A Wound Location: Trauma Laceration N/A Wounding Event: Skin T ear Skin T ear N/A Primary Etiology: Asthma, Hypertension, Hepatitis A, Asthma, Hypertension, Hepatitis A, N/A Comorbid History: Rheumatoid Arthritis Rheumatoid Arthritis 03/22/2022 03/25/2022 N/A Date A cquired: 1 0 N/A Weeks of Treatment: Open Open N/A Wound Status: No No N/A Wound Recurrence: 1.2x3.2x0.1 2.7x0.1x0.1 N/A Measurements L x W x D (cm) 3.016 0.212 N/A A (cm) : rea 0.302 0.021 N/A Volume (cm) : 31.40% N/A N/A % Reduction in A rea: 31.40% N/A N/A % Reduction in Volume: Full Thickness Without Exposed Full Thickness Without Exposed N/A Classification: Support Structures Support Structures Medium Medium N/A Exudate A mount: Serosanguineous Serosanguineous N/A Exudate Type: red, brown red, brown N/A Exudate Color: Distinct, outline attached Distinct, outline attached N/A Wound Margin: Large (67-100%) Large (67-100%) N/A Granulation A mount: Pink Red N/A Granulation Quality: Small (1-33%) N/A N/A Necrotic A mount: Fat Layer (Subcutaneous Tissue): Yes Fat Layer (Subcutaneous Tissue): Yes N/A Exposed Structures: Fascia: No Fascia: No Tendon: No Tendon: No Muscle: No Muscle: No Joint: No Joint: No Bone: No Bone: No Small (1-33%) Small (1-33%) N/A Epithelialization: N/A N/A  N/A Debridement: N/A N/A N/A Pain Control: N/A N/A N/A Tissue Debrided: N/A N/A N/A Level: N/A N/A N/A Debridement A (sq cm): rea N/A N/A N/A Instrument: N/A N/A N/A Bleeding: N/A N/A N/A Hemostasis A chieved: N/A N/A N/A Procedural Pain: N/A N/A N/A Post Procedural Pain: Debridement Treatment Response: N/A N/A N/A Post Debridement Measurements L x N/A N/A N/A W x D (cm) N/A N/A N/A Post Debridement Volume: (cm) N/A Compression Therapy N/A Procedures Performed: Treatment Notes Wound #2 (Lower Leg) Wound Laterality: Left, Anterior Cleanser Soap and Water Discharge Instruction: May shower and wash wound with dial antibacterial soap and water prior to dressing change. Peri-Wound Care Sween Lotion (Moisturizing lotion) Discharge Instruction: Apply moisturizing lotion as directed Topical topical compounding antibiotics Discharge Instruction: apply directly to wound bed. Primary Dressing PolyMem Silver Non-Adhesive Dressing, 4.25x4.25 in Discharge Instruction: Apply to wound bed as instructed Secondary Dressing ABD Pad, 8x10 Discharge Instruction: Apply over primary dressing as directed. Secured With Compression Wrap ThreePress (3 layer compression wrap) Discharge Instruction: Apply three layer compression as directed. Compression Stockings Add-Ons Wound #3 (Lower Leg) Wound Laterality: Right, Medial Cleanser Soap and Water Discharge Instruction: May shower and wash wound with dial antibacterial soap and water prior to dressing change. Peri-Wound Care Sween Lotion (Moisturizing lotion) Discharge Instruction: Apply moisturizing lotion as directed Topical topical compounding antibiotics Discharge Instruction: apply directly to wound bed. Primary Dressing PolyMem Silver Non-Adhesive Dressing, 4.25x4.25 in Discharge Instruction: Apply to wound bed as instructed Secondary Dressing ABD Pad, 8x10 Discharge Instruction: Apply over primary dressing as  directed. Secured With Compression Wrap ThreePress (3 layer compression wrap) Discharge Instruction: Apply three layer  compression as directed. Compression Stockings Add-Ons Wound #4 (Lower Leg) Wound Laterality: Right, Anterior Cleanser Soap and Water Discharge Instruction: May shower and wash wound with dial antibacterial soap and water prior to dressing change. Peri-Wound Care Sween Lotion (Moisturizing lotion) Discharge Instruction: Apply moisturizing lotion as directed Topical topical compounding antibiotics Discharge Instruction: apply directly to wound bed. Primary Dressing PolyMem Silver Non-Adhesive Dressing, 4.25x4.25 in Discharge Instruction: Apply to wound bed as instructed Secondary Dressing ABD Pad, 8x10 Discharge Instruction: Apply over primary dressing as directed. Secured With Compression Wrap ThreePress (3 layer compression wrap) Discharge Instruction: Apply three layer compression as directed. Compression Stockings Add-Ons Wound #5 (Elbow) Wound Laterality: Right Cleanser Soap and Water Discharge Instruction: May shower and wash wound with dial antibacterial soap and water prior to dressing change. Wound Cleanser Discharge Instruction: Cleanse the wound with wound cleanser prior to applying a clean dressing using gauze sponges, not tissue or cotton balls. Peri-Wound Care Skin Prep Discharge Instruction: Use skin prep as directed Topical bacitracin Discharge Instruction: bacitracin ointment daily to wound. Primary Dressing Secondary Dressing Zetuvit Plus Silicone Border Dressing 4x4 (in/in) Discharge Instruction: Apply silicone border over primary dressing as directed. Secured With Compression Wrap Compression Stockings Add-Ons Wound #6 (Lower Leg) Wound Laterality: Right, Lateral Cleanser Soap and Water Discharge Instruction: May shower and wash wound with dial antibacterial soap and water prior to dressing change. Peri-Wound Care Sween Lotion  (Moisturizing lotion) Discharge Instruction: Apply moisturizing lotion as directed Topical topical compounding antibiotics Discharge Instruction: apply directly to wound bed. Primary Dressing Secondary Dressing ABD Pad, 8x10 Discharge Instruction: Apply over primary dressing as directed. Secured With Compression Wrap ThreePress (3 layer compression wrap) Discharge Instruction: Apply three layer compression as directed. Compression Stockings Add-Ons Electronic Signature(s) Signed: 03/31/2022 10:33:21 AM By: Kalman Shan DO Signed: 03/31/2022 5:00:34 PM By: Deon Pilling RN, BSN Entered By: Kalman Shan on 03/31/2022 09:36:52 -------------------------------------------------------------------------------- Multi-Disciplinary Care Plan Details Patient Name: Date of Service: Brian Leon, RO NN P. 03/31/2022 8:00 A M Medical Record Number: [id number] Patient Account Number: [id number] Date of Birth/Sex: Treating RN: [birth year] (76 y.o. [name] Primary Care Leman Martinek: Gerlene Fee Other Clinician: Referring Sylas Twombly: Treating Jakwon Gayton/Extender: Hubert Azure in Treatment: 20 Active Inactive Necrotic Tissue Nursing Diagnoses: Impaired tissue integrity related to necrotic/devitalized tissue Knowledge deficit related to management of necrotic/devitalized tissue Goals: Necrotic/devitalized tissue will be minimized in the wound bed Date Initiated: 02/03/2022 Target Resolution Date: 04/29/2022 Goal Status: Active Patient/caregiver will verbalize understanding of reason and process for debridement of necrotic tissue Date Initiated: 02/03/2022 Date Inactivated: 03/24/2022 Target Resolution Date: 04/01/2022 Goal Status: Met Interventions: Assess patient pain level pre-, during and post procedure and prior to discharge Provide education on necrotic tissue and debridement process Treatment Activities: Apply topical anesthetic as ordered :  02/03/2022 Excisional debridement : 02/03/2022 Notes: Electronic Signature(s) Signed: 03/31/2022 5:00:34 PM By: Deon Pilling RN, BSN Entered By: Deon Pilling on 03/31/2022 09:02:20 -------------------------------------------------------------------------------- Pain Assessment Details Patient Name: Date of Service: Brian Leon, RO NN P. 03/31/2022 8:00 A M Medical Record Number: [id number] Patient Account Number: [id number] Date of Birth/Sex: Treating RN: 30-Dec-1945 (76 y.o. [name] Primary Care Kinsleigh Ludolph: Gerlene Fee Other Clinician: Referring Steel Kerney: Treating Madden Garron/Extender: Hubert Azure in Treatment: 20 Active Problems Location of Pain Severity and Description of Pain Patient Has Paino No Site Locations Rate the pain. Rate the pain. Current Pain Level: 0 Pain Management and Medication Current Pain Management: Medication: No Cold Application: No Rest: No Massage:  No Activity: No T.E.N.S.: No Heat Application: No Leg drop or elevation: No Is the Current Pain Management Adequate: Adequate How does your wound impact your activities of daily livingo Sleep: No Bathing: No Appetite: No Relationship With Others: No Bladder Continence: No Emotions: No Bowel Continence: No Work: No Toileting: No Drive: No Dressing: No Hobbies: No Engineer, maintenance) Signed: 03/31/2022 5:00:34 PM By: Deon Pilling RN, BSN Entered By: Deon Pilling on 03/31/2022 08:12:25 -------------------------------------------------------------------------------- Patient/Caregiver Education Details Patient Name: Date of Service: Brian Leon, RO NN P. 6/29/2023andnbsp8:00 A M Medical Record Number: [id number] Patient Account Number: [id number] Date of Birth/Gender: Treating RN: Oct 17, 1945 (76 y.o. [name] Primary Care Physician: Gerlene Fee Other Clinician: Referring Physician: Treating Physician/Extender: Hubert Azure in Treatment: 20 Education Assessment Education Provided To: Patient Education Topics Provided Wound Debridement: Handouts: Wound Debridement Methods: Explain/Verbal Responses: Reinforcements needed Electronic Signature(s) Signed: 03/31/2022 5:00:34 PM By: Deon Pilling RN, BSN Entered By: Deon Pilling on 03/31/2022 09:03:11 -------------------------------------------------------------------------------- Wound Assessment Details Patient Name: Date of Service: Brian Leon, RO NN P. 03/31/2022 8:00 A M Medical Record Number: [id number] Patient Account Number: [id number] Date of Birth/Sex: Treating RN: 12-03-1945 (76 y.o. [name] Primary Care Breshae Belcher: Gerlene Fee Other Clinician: Referring Lavere Stork: Treating Donaciano Range/Extender: Hubert Azure in Treatment: 20 Wound Status Wound Number: 2 Primary Etiology: Abrasion Wound Location: Left, Anterior Lower Leg Wound Status: Open Wounding Event: Trauma Comorbid History: Asthma, Hypertension, Hepatitis A, Rheumatoid Arthritis Date Acquired: 01/04/2022 Weeks Of Treatment: 8 Clustered Wound: No Photos Wound Measurements Length: (cm) 1.1 Width: (cm) 1 Depth: (cm) 0.1 Area: (cm) 0.864 Volume: (cm) 0.086 % Reduction in Area: -103.8% % Reduction in Volume: -104.8% Epithelialization: Medium (34-66%) Tunneling: No Undermining: No Wound Description Classification: Full Thickness Without Exposed Support Structures Wound Margin: Distinct, outline attached Exudate Amount: Medium Exudate Type: Serosanguineous Exudate Color: red, brown Foul Odor After Cleansing: No Slough/Fibrino Yes Wound Bed Granulation Amount: Large (67-100%) Exposed Structure Granulation Quality: Pink Fascia Exposed: No Necrotic Amount: Small (1-33%) Fat Layer (Subcutaneous Tissue) Exposed: Yes Necrotic Quality: Adherent Slough Tendon Exposed: No Muscle Exposed: No Joint Exposed: No Bone Exposed:  No Treatment Notes Wound #2 (Lower Leg) Wound Laterality: Left, Anterior Cleanser Soap and Water Discharge Instruction: May shower and wash wound with dial antibacterial soap and water prior to dressing change. Peri-Wound Care Sween Lotion (Moisturizing lotion) Discharge Instruction: Apply moisturizing lotion as directed Topical topical compounding antibiotics Discharge Instruction: apply directly to wound bed. Primary Dressing PolyMem Silver Non-Adhesive Dressing, 4.25x4.25 in Discharge Instruction: Apply to wound bed as instructed Secondary Dressing ABD Pad, 8x10 Discharge Instruction: Apply over primary dressing as directed. Secured With Compression Wrap ThreePress (3 layer compression wrap) Discharge Instruction: Apply three layer compression as directed. Compression Stockings Add-Ons Electronic Signature(s) Signed: 03/31/2022 5:00:34 PM By: Deon Pilling RN, BSN Entered By: Deon Pilling on 03/31/2022 08:17:58 -------------------------------------------------------------------------------- Wound Assessment Details Patient Name: Date of Service: Brian Leon, RO NN P. 03/31/2022 8:00 A M Medical Record Number: [id number] Patient Account Number: [id number] Date of Birth/Sex: Treating RN: 1945/10/05 (76 y.o. [name] Primary Care Zamaria Brazzle: Gerlene Fee Other Clinician: Referring Cheyene Hamric: Treating Dalanie Kisner/Extender: Hubert Azure in Treatment: 20 Wound Status Wound Number: 3 Primary Etiology: Abrasion Wound Location: Right, Medial Lower Leg Wound Status: Open Wounding Event: Trauma Comorbid History: Asthma, Hypertension, Hepatitis A, Rheumatoid Arthritis Date Acquired: 01/24/2022 Weeks Of Treatment: 8 Clustered Wound: No Photos Wound Measurements Length: (cm) 2.1 Width: (cm) 1.7 Depth: (cm)  0.1 Area: (cm) 2.804 Volume: (cm) 0.28 % Reduction in Area: 77.3% % Reduction in Volume: 77.4% Epithelialization: Medium  (34-66%) Tunneling: No Undermining: No Wound Description Classification: Full Thickness Without Exposed Support Structures Wound Margin: Distinct, outline attached Exudate Amount: Medium Exudate Type: Serosanguineous Exudate Color: red, brown Foul Odor After Cleansing: No Slough/Fibrino Yes Wound Bed Granulation Amount: Large (67-100%) Exposed Structure Granulation Quality: Red, Pink Fascia Exposed: No Necrotic Amount: Small (1-33%) Fat Layer (Subcutaneous Tissue) Exposed: Yes Necrotic Quality: Adherent Slough Tendon Exposed: No Muscle Exposed: No Joint Exposed: No Bone Exposed: No Treatment Notes Wound #3 (Lower Leg) Wound Laterality: Right, Medial Cleanser Soap and Water Discharge Instruction: May shower and wash wound with dial antibacterial soap and water prior to dressing change. Peri-Wound Care Sween Lotion (Moisturizing lotion) Discharge Instruction: Apply moisturizing lotion as directed Topical topical compounding antibiotics Discharge Instruction: apply directly to wound bed. Primary Dressing PolyMem Silver Non-Adhesive Dressing, 4.25x4.25 in Discharge Instruction: Apply to wound bed as instructed Secondary Dressing ABD Pad, 8x10 Discharge Instruction: Apply over primary dressing as directed. Secured With Compression Wrap ThreePress (3 layer compression wrap) Discharge Instruction: Apply three layer compression as directed. Compression Stockings Add-Ons Electronic Signature(s) Signed: 03/31/2022 5:00:34 PM By: Deon Pilling RN, BSN Entered By: Deon Pilling on 03/31/2022 08:18:45 -------------------------------------------------------------------------------- Wound Assessment Details Patient Name: Date of Service: Brian Leon, RO NN P. 03/31/2022 8:00 A M Medical Record Number: [id number] Patient Account Number: [id number] Date of Birth/Sex: Treating RN: 1945-10-22 (76 y.o. [name] Primary Care Jemaine Prokop: Gerlene Fee Other  Clinician: Referring Maleny Candy: Treating Jazma Pickel/Extender: Hubert Azure in Treatment: 20 Wound Status Wound Number: 4 Primary Etiology: Abrasion Wound Location: Right, Anterior Lower Leg Wound Status: Open Wounding Event: Trauma Comorbid History: Asthma, Hypertension, Hepatitis A, Rheumatoid Arthritis Date Acquired: 01/04/2022 Weeks Of Treatment: 8 Clustered Wound: No Photos Wound Measurements Length: (cm) 1.3 Width: (cm) 1.7 Depth: (cm) 0.1 Area: (cm) 1.736 Volume: (cm) 0.174 % Reduction in Area: -426.1% % Reduction in Volume: -427.3% Epithelialization: Small (1-33%) Tunneling: No Undermining: No Wound Description Classification: Full Thickness Without Exposed Support Structures Wound Margin: Distinct, outline attached Exudate Amount: Medium Exudate Type: Serosanguineous Exudate Color: red, brown Foul Odor After Cleansing: No Slough/Fibrino Yes Wound Bed Granulation Amount: Medium (34-66%) Exposed Structure Granulation Quality: Pink Fascia Exposed: No Necrotic Amount: Medium (34-66%) Fat Layer (Subcutaneous Tissue) Exposed: Yes Necrotic Quality: Adherent Slough Tendon Exposed: No Muscle Exposed: No Joint Exposed: No Bone Exposed: No Treatment Notes Wound #4 (Lower Leg) Wound Laterality: Right, Anterior Cleanser Soap and Water Discharge Instruction: May shower and wash wound with dial antibacterial soap and water prior to dressing change. Peri-Wound Care Sween Lotion (Moisturizing lotion) Discharge Instruction: Apply moisturizing lotion as directed Topical topical compounding antibiotics Discharge Instruction: apply directly to wound bed. Primary Dressing PolyMem Silver Non-Adhesive Dressing, 4.25x4.25 in Discharge Instruction: Apply to wound bed as instructed Secondary Dressing ABD Pad, 8x10 Discharge Instruction: Apply over primary dressing as directed. Secured With Compression Wrap ThreePress (3 layer compression  wrap) Discharge Instruction: Apply three layer compression as directed. Compression Stockings Add-Ons Electronic Signature(s) Signed: 03/31/2022 5:00:34 PM By: Deon Pilling RN, BSN Entered By: Deon Pilling on 03/31/2022 08:19:29 -------------------------------------------------------------------------------- Wound Assessment Details Patient Name: Date of Service: Brian Leon, RO NN P. 03/31/2022 8:00 A M Medical Record Number: [id number] Patient Account Number: [id number] Date of Birth/Sex: Treating RN: 02/29/1946 (76 y.o. [name] Primary Care Jonn Chaikin: Gerlene Fee Other Clinician: Referring Jhayla Podgorski: Treating Dannie Woolen/Extender: Hubert Azure in Treatment:  20 Wound Status Wound Number: 5 Primary Etiology: Skin Tear Wound Location: Right Elbow Wound Status: Open Wounding Event: Trauma Comorbid History: Asthma, Hypertension, Hepatitis A, Rheumatoid Arthritis Date Acquired: 03/22/2022 Weeks Of Treatment: 1 Clustered Wound: No Photos Wound Measurements Length: (cm) 1.2 Width: (cm) 3.2 Depth: (cm) 0.1 Area: (cm) 3.016 Volume: (cm) 0.302 % Reduction in Area: 31.4% % Reduction in Volume: 31.4% Epithelialization: Small (1-33%) Tunneling: No Undermining: No Wound Description Classification: Full Thickness Without Exposed Support Structures Wound Margin: Distinct, outline attached Exudate Amount: Medium Exudate Type: Serosanguineous Exudate Color: red, brown Foul Odor After Cleansing: No Slough/Fibrino Yes Wound Bed Granulation Amount: Large (67-100%) Exposed Structure Granulation Quality: Pink Fascia Exposed: No Necrotic Amount: Small (1-33%) Fat Layer (Subcutaneous Tissue) Exposed: Yes Necrotic Quality: Adherent Slough Tendon Exposed: No Muscle Exposed: No Joint Exposed: No Bone Exposed: No Treatment Notes Wound #5 (Elbow) Wound Laterality: Right Cleanser Soap and Water Discharge Instruction: May shower and wash wound  with dial antibacterial soap and water prior to dressing change. Wound Cleanser Discharge Instruction: Cleanse the wound with wound cleanser prior to applying a clean dressing using gauze sponges, not tissue or cotton balls. Peri-Wound Care Skin Prep Discharge Instruction: Use skin prep as directed Topical bacitracin Discharge Instruction: bacitracin ointment daily to wound. Primary Dressing Secondary Dressing Zetuvit Plus Silicone Border Dressing 4x4 (in/in) Discharge Instruction: Apply silicone border over primary dressing as directed. Secured With Compression Wrap Compression Stockings Environmental education officer) Signed: 03/31/2022 5:00:34 PM By: Deon Pilling RN, BSN Entered By: Deon Pilling on 03/31/2022 08:20:00 -------------------------------------------------------------------------------- Wound Assessment Details Patient Name: Date of Service: Brian Leon, RO NN P. 03/31/2022 8:00 A M Medical Record Number: [id number] Patient Account Number: [id number] Date of Birth/Sex: Treating RN: 11/03/1945 (76 y.o. [name] Primary Care Maryelizabeth Eberle: Gerlene Fee Other Clinician: Referring Jairy Angulo: Treating Chantal Worthey/Extender: Hubert Azure in Treatment: 20 Wound Status Wound Number: 6 Primary Etiology: Skin Tear Wound Location: Right, Lateral Lower Leg Wound Status: Open Wounding Event: Laceration Comorbid History: Asthma, Hypertension, Hepatitis A, Rheumatoid Arthritis Date Acquired: 03/25/2022 Weeks Of Treatment: 0 Clustered Wound: No Photos Wound Measurements Length: (cm) 2.7 Width: (cm) 0.1 Depth: (cm) 0.1 Area: (cm) 0.212 Volume: (cm) 0.021 % Reduction in Area: % Reduction in Volume: Epithelialization: Small (1-33%) Tunneling: No Undermining: No Wound Description Classification: Full Thickness Without Exposed Support Structures Wound Margin: Distinct, outline attached Exudate Amount: Medium Exudate Type:  Serosanguineous Exudate Color: red, brown Foul Odor After Cleansing: No Slough/Fibrino No Wound Bed Granulation Amount: Large (67-100%) Exposed Structure Granulation Quality: Red Fascia Exposed: No Fat Layer (Subcutaneous Tissue) Exposed: Yes Tendon Exposed: No Muscle Exposed: No Joint Exposed: No Bone Exposed: No Treatment Notes Wound #6 (Lower Leg) Wound Laterality: Right, Lateral Cleanser Soap and Water Discharge Instruction: May shower and wash wound with dial antibacterial soap and water prior to dressing change. Peri-Wound Care Sween Lotion (Moisturizing lotion) Discharge Instruction: Apply moisturizing lotion as directed Topical topical compounding antibiotics Discharge Instruction: apply directly to wound bed. Primary Dressing Secondary Dressing ABD Pad, 8x10 Discharge Instruction: Apply over primary dressing as directed. Secured With Compression Wrap ThreePress (3 layer compression wrap) Discharge Instruction: Apply three layer compression as directed. Compression Stockings Add-Ons Electronic Signature(s) Signed: 03/31/2022 5:00:34 PM By: Deon Pilling RN, BSN Entered By: Deon Pilling on 03/31/2022 08:22:17 -------------------------------------------------------------------------------- Vitals Details Patient Name: Date of Service: Brian Leon, RO NN P. 03/31/2022 8:00 A M Medical Record Number: [id number] Patient Account Number: [id number] Date of Birth/Sex: Treating RN: Feb 01, 1946 (76 y.o. M)  Deon Pilling Primary Care Brittany Osier: Gerlene Fee Other Clinician: Referring Daeron Carreno: Treating Cece Milhouse/Extender: Hubert Azure in Treatment: 20 Vital Signs Time Taken: 08:10 Temperature (F): 98 Height (in): 68 Pulse (bpm): 84 Weight (lbs): 140 Respiratory Rate (breaths/min): 20 Body Mass Index (BMI): 21.3 Blood Pressure (mmHg): 144/79 Reference Range: 80 - 120 mg / dl Electronic Signature(s) Signed: 03/31/2022 5:00:34 PM By:  Deon Pilling RN, BSN Entered By: Deon Pilling on 03/31/2022 08:12:12

## 2022-04-01 ENCOUNTER — Telehealth: Payer: Self-pay | Admitting: Family Medicine

## 2022-04-01 DIAGNOSIS — R197 Diarrhea, unspecified: Secondary | ICD-10-CM

## 2022-04-01 LAB — COMPREHENSIVE METABOLIC PANEL
ALT: 14 IU/L (ref 0–44)
AST: 14 IU/L (ref 0–40)
Albumin/Globulin Ratio: 1.5 (ref 1.2–2.2)
Albumin: 3.8 g/dL (ref 3.7–4.7)
Alkaline Phosphatase: 85 IU/L (ref 44–121)
BUN/Creatinine Ratio: 14 (ref 10–24)
BUN: 15 mg/dL (ref 8–27)
Bilirubin Total: 0.4 mg/dL (ref 0.0–1.2)
CO2: 22 mmol/L (ref 20–29)
Calcium: 9.2 mg/dL (ref 8.6–10.2)
Chloride: 105 mmol/L (ref 96–106)
Creatinine, Ser: 1.1 mg/dL (ref 0.76–1.27)
Globulin, Total: 2.5 g/dL (ref 1.5–4.5)
Glucose: 74 mg/dL (ref 70–99)
Potassium: 4.1 mmol/L (ref 3.5–5.2)
Sodium: 139 mmol/L (ref 134–144)
Total Protein: 6.3 g/dL (ref 6.0–8.5)
eGFR: 69 mL/min/{1.73_m2} (ref >59)

## 2022-04-01 LAB — CBC WITH DIFFERENTIAL/PLATELET
Basophils Absolute: 0.1 10*3/uL (ref 0.0–0.2)
Basos: 0 %
EOS (ABSOLUTE): 0.4 10*3/uL (ref 0.0–0.4)
Eos: 2 %
Hematocrit: 34.9 % — ABNORMAL LOW (ref 37.5–51.0)
Hemoglobin: 11.9 g/dL — ABNORMAL LOW (ref 13.0–17.7)
Immature Grans (Abs): 0.1 10*3/uL (ref 0.0–0.1)
Immature Granulocytes: 1 %
Lymphocytes Absolute: 2.2 10*3/uL (ref 0.7–3.1)
Lymphs: 13 %
MCH: 33.5 pg — ABNORMAL HIGH (ref 26.6–33.0)
MCHC: 34.1 g/dL (ref 31.5–35.7)
MCV: 98 fL — ABNORMAL HIGH (ref 79–97)
Monocytes Absolute: 1.4 10*3/uL — ABNORMAL HIGH (ref 0.1–0.9)
Monocytes: 8 %
Neutrophils Absolute: 12.7 10*3/uL — ABNORMAL HIGH (ref 1.4–7.0)
Neutrophils: 76 %
Platelets: 418 10*3/uL (ref 150–450)
RBC: 3.55 x10E6/uL — ABNORMAL LOW (ref 4.14–5.80)
RDW: 12.1 % (ref 11.6–15.4)
WBC: 16.8 10*3/uL — ABNORMAL HIGH (ref 3.4–10.8)

## 2022-04-01 NOTE — Telephone Encounter (Signed)
Discussed results which were reassuring for normal kidney function and electrolytes.  CBC with significant leukocytosis and left shift concerning for an infection.  Patient has had a months worth of diarrhea, and has had this repeated several times in the last year about every 3 months.  He is also on a number of immunosuppressant drugs including Dupixent, prednisone, pantoprazole, all of which make it more likely that he would have a GI infection.  Medicare did not and the GI pathogen panel to check for C. difficile and Giardia primarily, this would cost patient $819 out-of-pocket.  Discussed options with lab technicians and preceptor, our main goal is to check for C. difficile and Giardia, will order these separately.  I called and discussed results and concern and lab cost with patient.  He agrees with the above plan.  He will call on Monday to make a lab appointment for a stool sample.  We also discussed return precautions in case the infection gets worse and he does have signs of dehydration or sepsis to go to the emergency department, patient expressed understanding.  Gladys Damme, MD [facility], PGY-3

## 2022-04-01 NOTE — Telephone Encounter (Signed)
Prolia received from [facility]. Placed in refrigerator. Patient still needs appt  [name], PharmD, MPH, BCPS, CPP Clinical Pharmacist (Rheumatology and Pulmonology)

## 2022-04-02 DIAGNOSIS — A0472 Enterocolitis due to Clostridium difficile, not specified as recurrent: Secondary | ICD-10-CM

## 2022-04-02 HISTORY — DX: Enterocolitis due to Clostridium difficile, not specified as recurrent: A04.72

## 2022-04-03 DIAGNOSIS — R197 Diarrhea, unspecified: Secondary | ICD-10-CM | POA: Insufficient documentation

## 2022-04-03 NOTE — Progress Notes (Cosign Needed Addendum)
    SUBJECTIVE:   CHIEF COMPLAINT / HPI:   Diarrhea: 76 year old gentleman presents with diarrhea for about the last month this is occurred intermittently about every 3 months in the last year.  He has not been out of the country in the last year, he has no camping history or other exposure to water outside of tap.  Patient is currently on Dupixent, low-dose prednisone, and Flonase topical, for many years for PMR.  Diarrhea is loose, watery, brown, no mucus, does not float, he only had 1 episode of small amount of blood 1 time.  He is also had abdominal cramps with bowel movements.  Bowel movements happens throughout the day 5-7 times a day, not associated with or without eating.  He has had no fevers, cough, runny nose, shortness of breath, chest pain, rash, no vomiting.  He has had some nausea.  He had a colonoscopy in August 2022 gastroenterology that showed diverticula throughout the colon, small internal hemorrhoids, narrowing of the colon in association with diverticular opening, no follow-up.  Screening for colorectal cancer.  PERTINENT  PMH / PSH: Polymyalgia rheumatica, renal cell carcinoma, history of irritable bowel syndrome  OBJECTIVE:   BP 102/60   Pulse 86   Wt 135 lb (61.2 kg)   SpO2 98%   BMI 19.37 kg/m   Nursing note and vitals reviewed GEN: Elderly, WM, resting comfortably in chair, NAD, thin appearing HEENT: NCAT. PERRLA. Sclera without injection or icterus. MMM.  Neck: Supple.  No LAD Cardiac: Regular rate and rhythm. Normal S1/S2. No murmurs, rubs, or gallops appreciated. 2+ radial pulses. Lungs: Clear bilaterally to ascultation. No increased WOB, no accessory muscle usage. No w/r/r. Abdomen: Soft, nontender, nondistended, hyperactive bowel sounds Neuro: AOx3  Ext: no edema Psych: Pleasant and appropriate  ASSESSMENT/PLAN:   Diarrhea of presumed infectious origin 76 year old gentleman with 1 month of diarrhea that is concerning for infectious origin as patient has  diminished immune system due to medications including Dupixent, prednisone, pantoprazole.  Concerning for C. difficile in particular, will also look for Giardia.  We will obtain PCR panel for infectious causes, as well as CMP, CBC.  Will treat nausea with Zofran, and abdominal cramping with short term dose of dicyclomine.  I counseled patient about risk of anticholinergic medications such as dicyclomine and person over the age of 27.  However he has significant cramping, no underlying diagnosis of dementia, he does not report any memory issues, is significantly distressed by abdominal pain.  Benefits outweigh risks to trial a low-dose, short-term prescription of dicyclomine.  I am hesitant to use loperamide to slow down diarrhea as this is highly likely to be infectious in origin and loperamide can cause toxic megacolon.  Discussed return precautions and supportive care especially hydration with drinks rich in electrolytes.  Follow-up next week.     Gladys Damme, MD [place]

## 2022-04-03 NOTE — Assessment & Plan Note (Signed)
76 year old gentleman with 1 month of diarrhea that is concerning for infectious origin as patient has diminished immune system due to medications including Dupixent, prednisone, pantoprazole.  Concerning for C. difficile in particular, will also look for Giardia.  We will obtain PCR panel for infectious causes, as well as CMP, CBC.  Will treat nausea with Zofran, and abdominal cramping with short term dose of dicyclomine.  I counseled patient about risk of anticholinergic medications such as dicyclomine and person over the age of 43.  However he has significant cramping, no underlying diagnosis of dementia, he does not report any memory issues, is significantly distressed by abdominal pain.  Benefits outweigh risks to trial a low-dose, short-term prescription of dicyclomine.  I am hesitant to use loperamide to slow down diarrhea as this is highly likely to be infectious in origin and loperamide can cause toxic megacolon.  Discussed return precautions and supportive care especially hydration with drinks rich in electrolytes.  Follow-up next week.

## 2022-04-04 ENCOUNTER — Other Ambulatory Visit: Payer: Self-pay | Admitting: *Deleted

## 2022-04-04 ENCOUNTER — Other Ambulatory Visit: Payer: Medicare Other

## 2022-04-04 DIAGNOSIS — R197 Diarrhea, unspecified: Secondary | ICD-10-CM

## 2022-04-06 DIAGNOSIS — R197 Diarrhea, unspecified: Secondary | ICD-10-CM | POA: Diagnosis not present

## 2022-04-07 ENCOUNTER — Encounter (HOSPITAL_BASED_OUTPATIENT_CLINIC_OR_DEPARTMENT_OTHER): Payer: Medicare Other | Attending: Internal Medicine | Admitting: Internal Medicine

## 2022-04-07 DIAGNOSIS — S41111A Laceration without foreign body of right upper arm, initial encounter: Secondary | ICD-10-CM | POA: Insufficient documentation

## 2022-04-07 DIAGNOSIS — W228XXA Striking against or struck by other objects, initial encounter: Secondary | ICD-10-CM | POA: Diagnosis not present

## 2022-04-07 DIAGNOSIS — M353 Polymyalgia rheumatica: Secondary | ICD-10-CM | POA: Diagnosis not present

## 2022-04-07 DIAGNOSIS — I872 Venous insufficiency (chronic) (peripheral): Secondary | ICD-10-CM | POA: Diagnosis not present

## 2022-04-07 DIAGNOSIS — L97912 Non-pressure chronic ulcer of unspecified part of right lower leg with fat layer exposed: Secondary | ICD-10-CM | POA: Diagnosis not present

## 2022-04-07 DIAGNOSIS — L97822 Non-pressure chronic ulcer of other part of left lower leg with fat layer exposed: Secondary | ICD-10-CM | POA: Diagnosis present

## 2022-04-07 NOTE — Progress Notes (Signed)
Brian Leon, Brian Leon ([id number]) Visit Report for 04/07/2022 Arrival Information Details Patient Name: Date of Service: Brian Leon, [name] 04/07/2022 8:15 A M Medical Record Number: [id number] Patient Account Number: [id number] Date of Birth/Sex: Treating RN: [birth year] (76 y.o. [name], [name] Primary Care Corry Ihnen: [name] Other Clinician: Referring Jishnu Jenniges: Treating Kadian Barcellos/Extender: [name] Weeks in Treatment: 21 Visit Information History Since Last Visit Added or deleted any medications: No Patient Arrived: Ambulatory Any new allergies or adverse reactions: No Arrival Time: 08:10 Had a fall or experienced change in No Accompanied By: self activities of daily living that may affect Transfer Assistance: None risk of falls: Patient Identification Verified: Yes Signs or symptoms of abuse/neglect since last visito No Secondary Verification Process Completed: Yes Hospitalized since last visit: No Patient Requires Transmission-Based Precautions: No Implantable device outside of the clinic excluding No Patient Has Alerts: Yes cellular tissue based products placed in the center Patient Alerts: 11/02/21 L ABI: 1.37 since last visit: 11/02/2021 L TBI 0.77 Has Dressing in Place as Prescribed: Yes 11/02/2021 R TBI 0.69 Has Compression in Place as Prescribed: Yes 11/02/2021 R ABI 1.3 Pain Present Now: No Electronic Signature(s) Signed: 04/07/2022 4:18:35 PM By: Deon Pilling RN, BSN Entered By: Deon Pilling on 04/07/2022 08:34:18 -------------------------------------------------------------------------------- Compression Therapy Details Patient Name: Date of Service: Brian Leon, RO NN P. 04/07/2022 8:15 A M Medical Record Number: [id number] Patient Account Number: [id number] Date of Birth/Sex: Treating RN: 17-Feb-1946 (76 y.o. Brian Leon Primary Care Tonique Mendonca: [name] Other Clinician: Referring Blondina Coderre: Treating Brigett Estell/Extender: [name] Weeks in Treatment: 21 Compression Therapy Performed for Wound Assessment: Wound #2 Left,Anterior Lower Leg Performed By: Clinician Deon Pilling, RN Compression Type: Three Layer Post Procedure Diagnosis Same as Pre-procedure Electronic Signature(s) Signed: 04/07/2022 4:18:35 PM By: Deon Pilling RN, BSN Entered By: Deon Pilling on 04/07/2022 09:00:13 -------------------------------------------------------------------------------- Compression Therapy Details Patient Name: Date of Service: Brian Leon, RO NN P. 04/07/2022 8:15 A M Medical Record Number: [id number] Patient Account Number: [id number] Date of Birth/Sex: Treating RN: 04-Nov-1945 (76 y.o. Brian Leon Primary Care Rosaline Ezekiel: [name] Other Clinician: Referring Roberts Bon: Treating Tmya Wigington/Extender: [name] Weeks in Treatment: 21 Compression Therapy Performed for Wound Assessment: Wound #4 Right,Anterior Lower Leg Performed By: Clinician Deon Pilling, RN Compression Type: Three Layer Post Procedure Diagnosis Same as Pre-procedure Electronic Signature(s) Signed: 04/07/2022 4:18:35 PM By: Deon Pilling RN, BSN Entered By: Deon Pilling on 04/07/2022 09:00:13 -------------------------------------------------------------------------------- Compression Therapy Details Patient Name: Date of Service: Brian Leon, RO NN P. 04/07/2022 8:15 A M Medical Record Number: [id number] Patient Account Number: [id number] Date of Birth/Sex: Treating RN: 11/23/1945 (76 y.o. Brian Leon Primary Care Zahrah Sutherlin: [name] Other Clinician: Referring Vona Whiters: Treating Abir Craine/Extender: [name] Weeks in Treatment: 21 Compression Therapy Performed for Wound Assessment: Wound #3 Right,Medial Lower Leg Performed By: Clinician Deon Pilling, RN Compression Type: Three Layer Post Procedure Diagnosis Same as Pre-procedure Electronic Signature(s) Signed: 04/07/2022  4:18:35 PM By: Deon Pilling RN, BSN Entered By: Deon Pilling on 04/07/2022 09:00:13 -------------------------------------------------------------------------------- Compression Therapy Details Patient Name: Date of Service: Brian Leon, RO NN P. 04/07/2022 8:15 A M Medical Record Number: [id number] Patient Account Number: [id number] Date of Birth/Sex: Treating RN: June 17, 1946 (76 y.o. Brian Leon Primary Care Jarmon Javid: [name] Other Clinician: Referring Marilyn Wing: Treating Deanie Jupiter/Extender: [name] Weeks in Treatment: 21 Compression Therapy Performed for Wound Assessment: Wound #  6 Right,Lateral Lower Leg Performed By: Clinician Deon Pilling, RN Compression Type: Three Layer Post Procedure Diagnosis Same as Pre-procedure Electronic Signature(s) Signed: 04/07/2022 4:18:35 PM By: Deon Pilling RN, BSN Entered By: Deon Pilling on 04/07/2022 09:00:13 -------------------------------------------------------------------------------- Encounter Discharge Information Details Patient Name: Date of Service: Brian Leon, RO NN P. 04/07/2022 8:15 A M Medical Record Number: [id number] Patient Account Number: [id number] Date of Birth/Sex: Treating RN: Sep 17, 1946 (76 y.o. Brian Leon Primary Care Phylliss Strege: [name] Other Clinician: Referring Jhalil Silvera: Treating Brian Leon/Extender: [name] Weeks in Treatment: 47 Encounter Discharge Information Items Post Procedure Vitals Discharge Condition: Stable Temperature (F): 98.2 Ambulatory Status: Ambulatory Pulse (bpm): 57 Discharge Destination: Home Respiratory Rate (breaths/min): 16 Transportation: Private Auto Blood Pressure (mmHg): 117/68 Accompanied By: self Schedule Follow-up Appointment: Yes Clinical Summary of Care: Electronic Signature(s) Signed: 04/07/2022 4:18:35 PM By: Deon Pilling RN, BSN Entered By: Deon Pilling on 04/07/2022  09:05:45 -------------------------------------------------------------------------------- Lower Extremity Assessment Details Patient Name: Date of Service: Brian Leon, RO NN P. 04/07/2022 8:15 A M Medical Record Number: [id number] Patient Account Number: [id number] Date of Birth/Sex: Treating RN: 1946-02-13 (76 y.o. Brian Leon Primary Care Jolanda Mccann: [name] Other Clinician: Referring Gary Bultman: Treating Dayana Dalporto/Extender: [name] Weeks in Treatment: 21 Edema Assessment Assessed: [Left: Yes] [Right: Yes] Edema: [Left: No] [Right: No] Calf Left: Right: Point of Measurement: 37 cm From Medial Instep 28 cm 28 cm Ankle Left: Right: Point of Measurement: 12 cm From Medial Instep 18 cm 18 cm Vascular Assessment Pulses: Dorsalis Pedis Palpable: [Left:Yes] [Right:Yes] Electronic Signature(s) Signed: 04/07/2022 4:18:35 PM By: Deon Pilling RN, BSN Entered By: Deon Pilling on 04/07/2022 08:35:15 -------------------------------------------------------------------------------- Multi Wound Chart Details Patient Name: Date of Service: Brian Leon, RO NN P. 04/07/2022 8:15 A M Medical Record Number: [id number] Patient Account Number: [id number] Date of Birth/Sex: Treating RN: June 14, 1946 (76 y.o. [name], [name] Primary Care Ardell Aaronson: [name] Other Clinician: Referring Nikolette Reindl: Treating Abenezer Odonell/Extender: [name] Weeks in Treatment: 21 Vital Signs Height(in): 68 Pulse(bpm): 29 Weight(lbs): 140 Blood Pressure(mmHg): 117/68 Body Mass Index(BMI): 21.3 Temperature(F): Respiratory Rate(breaths/min): 16 Photos: Left, Anterior Lower Leg Right, Medial Lower Leg Right, Anterior Lower Leg Wound Location: Trauma Trauma Trauma Wounding Event: Abrasion Abrasion Abrasion Primary Etiology: Asthma, Hypertension, Hepatitis A, Asthma, Hypertension, Hepatitis A, Asthma, Hypertension, Hepatitis A, Comorbid History: Rheumatoid Arthritis  Rheumatoid Arthritis Rheumatoid Arthritis 01/04/2022 01/24/2022 01/04/2022 Date Acquired: _0 Weeks of Treatment: Open Open Open Wound Status: No No No Wound Recurrence: 1.1x1x0.1 1.5x2.5x0.1 1.2x1.7x0.1 Measurements L x W x D (cm) 0.864 2.945 1.602 A (cm) : rea 0.086 0.295 0.16 Volume (cm) : -103.80% 76.20% -385.50% % Reduction in A rea: -104.80% 76.20% -384.80% % Reduction in Volume: Full Thickness Without Exposed Full Thickness Without Exposed Full Thickness Without Exposed Classification: Support Structures Support Structures Support Structures Medium Medium Medium Exudate A mount: Serosanguineous Serosanguineous Serosanguineous Exudate Type: red, brown red, brown red, brown Exudate Color: Distinct, outline attached Distinct, outline attached Distinct, outline attached Wound Margin: Large (67-100%) Large (67-100%) Large (67-100%) Granulation A mount: Pink Red, Pink Pink Granulation Quality: Small (1-33%) Small (1-33%) Small (1-33%) Necrotic A mount: Fat Layer (Subcutaneous Tissue): Yes Fat Layer (Subcutaneous Tissue): Yes Fat Layer (Subcutaneous Tissue): Yes Exposed Structures: Fascia: No Fascia: No Fascia: No Tendon: No Tendon: No Tendon: No Muscle: No Muscle: No Muscle: No Joint: No Joint: No Joint: No Bone: No Bone: No Bone: No Medium (34-66%) Medium (34-66%) Small (1-33%) Epithelialization:  Debridement - Excisional Debridement - Excisional Debridement - Excisional Debridement: Pre-procedure Verification/Time Out 08:50 08:50 08:50 Taken: Lidocaine 4% Topical Solution Lidocaine 4% Topical Solution Lidocaine 4% Topical Solution Pain Control: Subcutaneous, Slough Subcutaneous, Slough Subcutaneous, Slough Tissue Debrided: Skin/Subcutaneous Tissue Skin/Subcutaneous Tissue Skin/Subcutaneous Tissue Level: 1.1 3.75 2.04 Debridement A (sq cm): rea Curette Curette Curette Instrument: Minimum Minimum Minimum Bleeding: Pressure Pressure  Pressure Hemostasis A chieved: 0 0 0 Procedural Pain: 0 0 0 Post Procedural Pain: Procedure was tolerated well Procedure was tolerated well Procedure was tolerated well Debridement Treatment Response: 1.1x1x0.1 1.5x2.5x0.1 1.2x1.7x0.1 Post Debridement Measurements L x W x D (cm) 0.086 0.295 0.16 Post Debridement Volume: (cm) Compression Therapy Compression Therapy Compression Therapy Procedures Performed: Debridement Debridement Debridement Wound Number: [id number] N/A Photos: N/A Right Elbow Right, Lateral Lower Leg N/A Wound Location: Trauma Laceration N/A Wounding Event: Skin T ear Skin T ear N/A Primary Etiology: Asthma, Hypertension, Hepatitis A, Asthma, Hypertension, Hepatitis A, N/A Comorbid History: Rheumatoid Arthritis Rheumatoid Arthritis 03/22/2022 03/25/2022 N/A Date Acquired: 2 1 N/A Weeks of Treatment: Healed - Epithelialized Open N/A Wound Status: No No N/A Wound Recurrence: 0x0x0 1.5x0.1x0.1 N/A Measurements L x W x D (cm) 0 0.118 N/A A (cm) : rea 0 0.012 N/A Volume (cm) : 100.00% 44.30% N/A % Reduction in A rea: 100.00% 42.90% N/A % Reduction in Volume: Full Thickness Without Exposed Full Thickness Without Exposed N/A Classification: Support Structures Support Structures None Present Medium N/A Exudate A mount: N/A Serosanguineous N/A Exudate Type: N/A red, brown N/A Exudate Color: Distinct, outline attached Distinct, outline attached N/A Wound Margin: None Present (0%) Large (67-100%) N/A Granulation A mount: N/A Red N/A Granulation Quality: None Present (0%) None Present (0%) N/A Necrotic A mount: Fascia: No Fat Layer (Subcutaneous Tissue): Yes N/A Exposed Structures: Fat Layer (Subcutaneous Tissue): No Fascia: No Tendon: No Tendon: No Muscle: No Muscle: No Joint: No Joint: No Bone: No Bone: No Large (67-100%) Small (1-33%) N/A Epithelialization: N/A Debridement - Excisional N/A Debridement: Pre-procedure Verification/Time  Out N/A 08:50 N/A Taken: N/A Lidocaine 4% Topical Solution N/A Pain Control: N/A Subcutaneous, Slough N/A Tissue Debrided: N/A Skin/Subcutaneous Tissue N/A Level: N/A 0.15 N/A Debridement A (sq cm): rea N/A Curette N/A Instrument: N/A Minimum N/A Bleeding: N/A Pressure N/A Hemostasis A chieved: N/A 0 N/A Procedural Pain: N/A 0 N/A Post Procedural Pain: N/A Procedure was tolerated well N/A Debridement Treatment Response: N/A 1.5x0.1x0.1 N/A Post Debridement Measurements L x W x D (cm) N/A 0.012 N/A Post Debridement Volume: (cm) N/A Compression Therapy N/A Procedures Performed: Debridement Treatment Notes Wound #2 (Lower Leg) Wound Laterality: Left, Anterior Cleanser Soap and Water Discharge Instruction: May shower and wash wound with dial antibacterial soap and water prior to dressing change. Peri-Wound Care Sween Lotion (Moisturizing lotion) Discharge Instruction: Apply moisturizing lotion as directed Topical topical compounding antibiotics Discharge Instruction: apply directly to wound bed. Primary Dressing PolyMem Silver Non-Adhesive Dressing, 4.25x4.25 in Discharge Instruction: Apply to wound bed as instructed Secondary Dressing ABD Pad, 8x10 Discharge Instruction: Apply over primary dressing as directed. Secured With Compression Wrap ThreePress (3 layer compression wrap) Discharge Instruction: Apply three layer compression as directed. Compression Stockings Add-Ons Wound #3 (Lower Leg) Wound Laterality: Right, Medial Cleanser Soap and Water Discharge Instruction: May shower and wash wound with dial antibacterial soap and water prior to dressing change. Peri-Wound Care Sween Lotion (Moisturizing lotion) Discharge Instruction: Apply moisturizing lotion as directed Topical topical compounding antibiotics Discharge Instruction: apply directly to wound bed. Primary Dressing PolyMem Silver Non-Adhesive Dressing, 4.25x4.25  in Discharge Instruction:  Apply to wound bed as instructed Secondary Dressing ABD Pad, 8x10 Discharge Instruction: Apply over primary dressing as directed. Secured With Compression Wrap ThreePress (3 layer compression wrap) Discharge Instruction: Apply three layer compression as directed. Compression Stockings Add-Ons Wound #4 (Lower Leg) Wound Laterality: Right, Anterior Cleanser Soap and Water Discharge Instruction: May shower and wash wound with dial antibacterial soap and water prior to dressing change. Peri-Wound Care Sween Lotion (Moisturizing lotion) Discharge Instruction: Apply moisturizing lotion as directed Topical topical compounding antibiotics Discharge Instruction: apply directly to wound bed. Primary Dressing PolyMem Silver Non-Adhesive Dressing, 4.25x4.25 in Discharge Instruction: Apply to wound bed as instructed Secondary Dressing ABD Pad, 8x10 Discharge Instruction: Apply over primary dressing as directed. Secured With Compression Wrap ThreePress (3 layer compression wrap) Discharge Instruction: Apply three layer compression as directed. Compression Stockings Add-Ons Wound #6 (Lower Leg) Wound Laterality: Right, Lateral Cleanser Soap and Water Discharge Instruction: May shower and wash wound with dial antibacterial soap and water prior to dressing change. Peri-Wound Care Sween Lotion (Moisturizing lotion) Discharge Instruction: Apply moisturizing lotion as directed Topical bacitracin Discharge Instruction: apply to wound bed. Primary Dressing Secondary Dressing ABD Pad, 8x10 Discharge Instruction: Apply over primary dressing as directed. Secured With Compression Wrap ThreePress (3 layer compression wrap) Discharge Instruction: Apply three layer compression as directed. Compression Stockings Add-Ons Electronic Signature(s) Signed: 04/07/2022 10:33:41 AM By: Kalman Shan DO Signed: 04/07/2022 4:18:35 PM By: Deon Pilling RN, BSN Entered By: Kalman Shan on 04/07/2022  10:23:57 -------------------------------------------------------------------------------- Multi-Disciplinary Care Plan Details Patient Name: Date of Service: Brian Leon, RO NN P. 04/07/2022 8:15 A M Medical Record Number: [id number] Patient Account Number: [id number] Date of Birth/Sex: Treating RN: December 04, 1945 (76 y.o. Brian Leon Primary Care Ralphael Southgate: [name] Other Clinician: Referring Yumalay Circle: Treating Stephen Baruch/Extender: [name] Weeks in Treatment: 21 Active Inactive Necrotic Tissue Nursing Diagnoses: Impaired tissue integrity related to necrotic/devitalized tissue Knowledge deficit related to management of necrotic/devitalized tissue Goals: Necrotic/devitalized tissue will be minimized in the wound bed Date Initiated: 02/03/2022 Target Resolution Date: 04/29/2022 Goal Status: Active Patient/caregiver will verbalize understanding of reason and process for debridement of necrotic tissue Date Initiated: 02/03/2022 Date Inactivated: 03/24/2022 Target Resolution Date: 04/01/2022 Goal Status: Met Interventions: Assess patient pain level pre-, during and post procedure and prior to discharge Provide education on necrotic tissue and debridement process Treatment Activities: Apply topical anesthetic as ordered : 02/03/2022 Excisional debridement : 02/03/2022 Notes: Electronic Signature(s) Signed: 04/07/2022 4:18:35 PM By: Deon Pilling RN, BSN Entered By: Deon Pilling on 04/07/2022 08:50:13 -------------------------------------------------------------------------------- Pain Assessment Details Patient Name: Date of Service: Brian Leon, RO NN P. 04/07/2022 8:15 A M Medical Record Number: [id number] Patient Account Number: [id number] Date of Birth/Sex: Treating RN: [birth year] (76 y.o. Brian Leon Primary Care Honore Wipperfurth: [name] Other Clinician: Referring Mazal Ebey: Treating Prince Couey/Extender: [name] Weeks in Treatment:  21 Active Problems Location of Pain Severity and Description of Pain Patient Has Paino No Site Locations Pain Management and Medication Current Pain Management: Electronic Signature(s) Signed: 04/07/2022 4:18:35 PM By: Deon Pilling RN, BSN Entered By: Deon Pilling on 04/07/2022 08:34:58 -------------------------------------------------------------------------------- Patient/Caregiver Education Details Patient Name: Date of Service: Brian Leon, RO NN P. 7/6/2023andnbsp8:15 [place] Record Number: [id number] Patient Account Number: [id number] Date of Birth/Gender: Treating RN: 1946/06/16 (76 y.o. Brian Leon Primary Care Physician: [name] Other Clinician: Referring Physician: Treating Physician/Extender: [name] Weeks in Treatment: 21 Education Assessment Education  Provided To: Patient Education Topics Provided Wound Debridement: Handouts: Wound Debridement Methods: Explain/Verbal Responses: State content correctly Electronic Signature(s) Signed: 04/07/2022 4:18:35 PM By: Deon Pilling RN, BSN Entered By: Deon Pilling on 04/07/2022 08:56:52 -------------------------------------------------------------------------------- Wound Assessment Details Patient Name: Date of Service: Brian Leon, RO NN P. 04/07/2022 8:15 A M Medical Record Number: [id number] Patient Account Number: [id number] Date of Birth/Sex: Treating RN: May 13, 1946 (76 y.o. Brian Leon Primary Care Jarrah Babich: [name] Other Clinician: Referring Janaria Mccammon: Treating Thera Basden/Extender: [name] Weeks in Treatment: 21 Wound Status Wound Number: 2 Primary Etiology: Abrasion Wound Location: Left, Anterior Lower Leg Wound Status: Open Wounding Event: Trauma Comorbid History: Asthma, Hypertension, Hepatitis A, Rheumatoid Arthritis Date Acquired: 01/04/2022 Weeks Of Treatment: 9 Clustered Wound: No Photos Wound Measurements Length: (cm) 1.1 Width:  (cm) 1 Depth: (cm) 0.1 Area: (cm) 0.864 Volume: (cm) 0.086 % Reduction in Area: -103.8% % Reduction in Volume: -104.8% Epithelialization: Medium (34-66%) Tunneling: No Undermining: No Wound Description Classification: Full Thickness Without Exposed Support Structures Wound Margin: Distinct, outline attached Exudate Amount: Medium Exudate Type: Serosanguineous Exudate Color: red, brown Foul Odor After Cleansing: No Slough/Fibrino Yes Wound Bed Granulation Amount: Large (67-100%) Exposed Structure Granulation Quality: Pink Fascia Exposed: No Necrotic Amount: Small (1-33%) Fat Layer (Subcutaneous Tissue) Exposed: Yes Necrotic Quality: Adherent Slough Tendon Exposed: No Muscle Exposed: No Joint Exposed: No Bone Exposed: No Treatment Notes Wound #2 (Lower Leg) Wound Laterality: Left, Anterior Cleanser Soap and Water Discharge Instruction: May shower and wash wound with dial antibacterial soap and water prior to dressing change. Peri-Wound Care Sween Lotion (Moisturizing lotion) Discharge Instruction: Apply moisturizing lotion as directed Topical topical compounding antibiotics Discharge Instruction: apply directly to wound bed. Primary Dressing PolyMem Silver Non-Adhesive Dressing, 4.25x4.25 in Discharge Instruction: Apply to wound bed as instructed Secondary Dressing ABD Pad, 8x10 Discharge Instruction: Apply over primary dressing as directed. Secured With Compression Wrap ThreePress (3 layer compression wrap) Discharge Instruction: Apply three layer compression as directed. Compression Stockings Add-Ons Electronic Signature(s) Signed: 04/07/2022 4:18:35 PM By: Deon Pilling RN, BSN Entered By: Deon Pilling on 04/07/2022 08:36:35 -------------------------------------------------------------------------------- Wound Assessment Details Patient Name: Date of Service: Brian Leon, RO NN P. 04/07/2022 8:15 A M Medical Record Number: [id number] Patient Account Number:  [id number] Date of Birth/Sex: Treating RN: 11/14/1945 (76 y.o. [name], [name] Primary Care Jodell Weitman: [name] Other Clinician: Referring Helga Asbury: Treating Draper Gallon/Extender: [name] Weeks in Treatment: 21 Wound Status Wound Number: 3 Primary Etiology: Abrasion Wound Location: Right, Medial Lower Leg Wound Status: Open Wounding Event: Trauma Comorbid History: Asthma, Hypertension, Hepatitis A, Rheumatoid Arthritis Date Acquired: 01/24/2022 Weeks Of Treatment: 9 Clustered Wound: No Photos Wound Measurements Length: (cm) 1.5 Width: (cm) 2.5 Depth: (cm) 0.1 Area: (cm) 2.945 Volume: (cm) 0.295 % Reduction in Area: 76.2% % Reduction in Volume: 76.2% Epithelialization: Medium (34-66%) Tunneling: No Undermining: No Wound Description Classification: Full Thickness Without Exposed Support Structures Wound Margin: Distinct, outline attached Exudate Amount: Medium Exudate Type: Serosanguineous Exudate Color: red, brown Foul Odor After Cleansing: No Slough/Fibrino Yes Wound Bed Granulation Amount: Large (67-100%) Exposed Structure Granulation Quality: Red, Pink Fascia Exposed: No Necrotic Amount: Small (1-33%) Fat Layer (Subcutaneous Tissue) Exposed: Yes Necrotic Quality: Adherent Slough Tendon Exposed: No Muscle Exposed: No Joint Exposed: No Bone Exposed: No Treatment Notes Wound #3 (Lower Leg) Wound Laterality: Right, Medial Cleanser Soap and Water Discharge Instruction: May shower and wash wound with dial antibacterial soap and water prior to dressing change. Peri-Wound Care Sween  Lotion (Moisturizing lotion) Discharge Instruction: Apply moisturizing lotion as directed Topical topical compounding antibiotics Discharge Instruction: apply directly to wound bed. Primary Dressing PolyMem Silver Non-Adhesive Dressing, 4.25x4.25 in Discharge Instruction: Apply to wound bed as instructed Secondary Dressing ABD Pad, 8x10 Discharge  Instruction: Apply over primary dressing as directed. Secured With Compression Wrap ThreePress (3 layer compression wrap) Discharge Instruction: Apply three layer compression as directed. Compression Stockings Add-Ons Electronic Signature(s) Signed: 04/07/2022 4:18:35 PM By: Deon Pilling RN, BSN Entered By: Deon Pilling on 04/07/2022 08:37:53 -------------------------------------------------------------------------------- Wound Assessment Details Patient Name: Date of Service: Brian Leon, RO NN P. 04/07/2022 8:15 A M Medical Record Number: [id number] Patient Account Number: [id number] Date of Birth/Sex: Treating RN: 06/04/1946 (76 y.o. Brian Leon Primary Care Liandro Thelin: [name] Other Clinician: Referring Chanika Byland: Treating Yuliya Nova/Extender: [name] Weeks in Treatment: 21 Wound Status Wound Number: 4 Primary Etiology: Abrasion Wound Location: Right, Anterior Lower Leg Wound Status: Open Wounding Event: Trauma Comorbid History: Asthma, Hypertension, Hepatitis A, Rheumatoid Arthritis Date Acquired: 01/04/2022 Weeks Of Treatment: 9 Clustered Wound: No Photos Wound Measurements Length: (cm) 1.2 Width: (cm) 1.7 Depth: (cm) 0.1 Area: (cm) 1.602 Volume: (cm) 0.16 % Reduction in Area: -385.5% % Reduction in Volume: -384.8% Epithelialization: Small (1-33%) Tunneling: No Undermining: No Wound Description Classification: Full Thickness Without Exposed Support Structures Wound Margin: Distinct, outline attached Exudate Amount: Medium Exudate Type: Serosanguineous Exudate Color: red, brown Foul Odor After Cleansing: No Slough/Fibrino Yes Wound Bed Granulation Amount: Large (67-100%) Exposed Structure Granulation Quality: Pink Fascia Exposed: No Necrotic Amount: Small (1-33%) Fat Layer (Subcutaneous Tissue) Exposed: Yes Necrotic Quality: Adherent Slough Tendon Exposed: No Muscle Exposed: No Joint Exposed: No Bone Exposed: No Treatment  Notes Wound #4 (Lower Leg) Wound Laterality: Right, Anterior Cleanser Soap and Water Discharge Instruction: May shower and wash wound with dial antibacterial soap and water prior to dressing change. Peri-Wound Care Sween Lotion (Moisturizing lotion) Discharge Instruction: Apply moisturizing lotion as directed Topical topical compounding antibiotics Discharge Instruction: apply directly to wound bed. Primary Dressing PolyMem Silver Non-Adhesive Dressing, 4.25x4.25 in Discharge Instruction: Apply to wound bed as instructed Secondary Dressing ABD Pad, 8x10 Discharge Instruction: Apply over primary dressing as directed. Secured With Compression Wrap ThreePress (3 layer compression wrap) Discharge Instruction: Apply three layer compression as directed. Compression Stockings Add-Ons Electronic Signature(s) Signed: 04/07/2022 4:18:35 PM By: Deon Pilling RN, BSN Entered By: Deon Pilling on 04/07/2022 08:37:15 -------------------------------------------------------------------------------- Wound Assessment Details Patient Name: Date of Service: Brian Leon, RO NN P. 04/07/2022 8:15 A M Medical Record Number: [id number] Patient Account Number: [id number] Date of Birth/Sex: Treating RN: May 10, 1946 (76 y.o. Brian Leon Primary Care Ailen Strauch: [name] Other Clinician: Referring Amisadai Woodford: Treating Alexsander Cavins/Extender: [name] Weeks in Treatment: 21 Wound Status Wound Number: 5 Primary Etiology: Skin Tear Wound Location: Right Elbow Wound Status: Healed - Epithelialized Wounding Event: Trauma Comorbid History: Asthma, Hypertension, Hepatitis A, Rheumatoid Arthritis Date Acquired: 03/22/2022 Weeks Of Treatment: 2 Clustered Wound: No Photos Wound Measurements Length: (cm) Width: (cm) Depth: (cm) Area: (cm) Volume: (cm) 0 % Reduction in Area: 100% 0 % Reduction in Volume: 100% 0 Epithelialization: Large (67-100%) 0 Tunneling: No 0 Undermining:  No Wound Description Classification: Full Thickness Without Exposed Support Structures Wound Margin: Distinct, outline attached Exudate Amount: None Present Foul Odor After Cleansing: No Slough/Fibrino No Wound Bed Granulation Amount: None Present (0%) Exposed Structure Necrotic Amount: None Present (0%) Fascia Exposed: No Fat Layer (Subcutaneous Tissue) Exposed: No Tendon Exposed: No  Muscle Exposed: No Joint Exposed: No Bone Exposed: No Electronic Signature(s) Signed: 04/07/2022 4:18:35 PM By: Deon Pilling RN, BSN Entered By: Deon Pilling on 04/07/2022 08:38:29 -------------------------------------------------------------------------------- Wound Assessment Details Patient Name: Date of Service: Brian Leon, RO NN P. 04/07/2022 8:15 A M Medical Record Number: [id number] Patient Account Number: [id number] Date of Birth/Sex: Treating RN: 1946-10-02 (76 y.o. [name], [name] Primary Care Ankith Edmonston: [name] Other Clinician: Referring Brenton Joines: Treating Sharay Bellissimo/Extender: [name] Weeks in Treatment: 21 Wound Status Wound Number: 6 Primary Etiology: Skin Tear Wound Location: Right, Lateral Lower Leg Wound Status: Open Wounding Event: Laceration Comorbid History: Asthma, Hypertension, Hepatitis A, Rheumatoid Arthritis Date Acquired: 03/25/2022 Weeks Of Treatment: 1 Clustered Wound: No Photos Wound Measurements Length: (cm) 1.5 Width: (cm) 0.1 Depth: (cm) 0.1 Area: (cm) 0.118 Volume: (cm) 0.012 % Reduction in Area: 44.3% % Reduction in Volume: 42.9% Epithelialization: Small (1-33%) Tunneling: No Undermining: No Wound Description Classification: Full Thickness Without Exposed Support Structu Wound Margin: Distinct, outline attached Exudate Amount: Medium Exudate Type: Serosanguineous Exudate Color: red, brown Wound Bed Granulation Amount: Large (67-100%) Granulation Quality: Red Necrotic Amount: None Present (0%) res Foul Odor After  Cleansing: No Slough/Fibrino No Exposed Structure Fascia Exposed: No Fat Layer (Subcutaneous Tissue) Exposed: Yes Tendon Exposed: No Muscle Exposed: No Joint Exposed: No Bone Exposed: No Treatment Notes Wound #6 (Lower Leg) Wound Laterality: Right, Lateral Cleanser Soap and Water Discharge Instruction: May shower and wash wound with dial antibacterial soap and water prior to dressing change. Peri-Wound Care Sween Lotion (Moisturizing lotion) Discharge Instruction: Apply moisturizing lotion as directed Topical bacitracin Discharge Instruction: apply to wound bed. Primary Dressing Secondary Dressing ABD Pad, 8x10 Discharge Instruction: Apply over primary dressing as directed. Secured With Compression Wrap ThreePress (3 layer compression wrap) Discharge Instruction: Apply three layer compression as directed. Compression Stockings Add-Ons Electronic Signature(s) Signed: 04/07/2022 4:18:35 PM By: Deon Pilling RN, BSN Entered By: Deon Pilling on 04/07/2022 08:39:22 -------------------------------------------------------------------------------- Vitals Details Patient Name: Date of Service: Brian Leon, RO NN P. 04/07/2022 8:15 A M Medical Record Number: [id number] Patient Account Number: [id number] Date of Birth/Sex: Treating RN: 13-Jan-1946 (76 y.o. Brian Leon Primary Care Ander Wamser: [name] Other Clinician: Referring Leighana Neyman: Treating Shmiel Morton/Extender: [name] Weeks in Treatment: 21 Vital Signs Time Taken: 08:15 Pulse (bpm): 57 Height (in): 68 Respiratory Rate (breaths/min): 16 Weight (lbs): 140 Blood Pressure (mmHg): 117/68 Body Mass Index (BMI): 21.3 Reference Range: 80 - 120 mg / dl Notes unable to check temp related to had cold drink. Electronic Signature(s) Signed: 04/07/2022 4:18:35 PM By: Deon Pilling RN, BSN Entered By: Deon Pilling on 04/07/2022 08:34:53

## 2022-04-07 NOTE — Telephone Encounter (Signed)
Spoke with patient. He has ongoing and potential GI infection. Just had stool sample drawn. He states he is seeing wound care but no longer has infection at wound site. IF patient is cleared of all infections by appt on 04/28/22, will plan to administer Prolia at that visit  [name], PharmD, MPH, BCPS, CPP Clinical Pharmacist (Rheumatology and Pulmonology)

## 2022-04-07 NOTE — Progress Notes (Signed)
HUSSAM, MUNIZ ([id number]) Visit Report for 04/07/2022 Chief Complaint Document Details Patient Name: Date of Service: Brian Leon, [name] 04/07/2022 8:15 A M Medical Record Number: [id number] Patient Account Number: [id number] Date of Birth/Sex: Treating RN: [birth year] (76 y.o. [name] Primary Care Provider: [name] Other Clinician: Referring Provider: Treating Provider/Extender: [name] Weeks in Treatment: 21 Information Obtained from: Patient Chief Complaint 11/11/2021; Left lower extremity wound due to trauma 02/03/2022; bilateral lower extremity wounds due to trauma Electronic Signature(s) Signed: 04/07/2022 10:33:41 AM By: Kalman Shan DO Entered By: Kalman Shan on 04/07/2022 10:24:03 -------------------------------------------------------------------------------- Debridement Details Patient Name: Date of Service: Brian Leon, RO NN P. 04/07/2022 8:15 A M Medical Record Number: [id number] Patient Account Number: [id number] Date of Birth/Sex: Treating RN: 06-22-1946 (76 y.o. [name], [name] Primary Care Provider: [name] Other Clinician: Referring Provider: Treating Provider/Extender: [name] Weeks in Treatment: 21 Debridement Performed for Assessment: Wound #2 Left,Anterior Lower Leg Performed By: Physician Kalman Shan, DO Debridement Type: Debridement Level of Consciousness (Pre-procedure): Awake and Alert Pre-procedure Verification/Time Out Yes - 08:50 Taken: Start Time: 08:51 Pain Control: Lidocaine 4% T opical Solution T Area Debrided (L x W): otal 1.1 (cm) x 1 (cm) = 1.1 (cm) Tissue and other material debrided: Viable, Non-Viable, Slough, Subcutaneous, Slough Level: Skin/Subcutaneous Tissue Debridement Description: Excisional Instrument: Curette Bleeding: Minimum Hemostasis Achieved: Pressure End Time: 09:00 Procedural Pain: 0 Post Procedural Pain: 0 Response to Treatment: Procedure was  tolerated well Level of Consciousness (Post- Awake and Alert procedure): Post Debridement Measurements of Total Wound Length: (cm) 1.1 Width: (cm) 1 Depth: (cm) 0.1 Volume: (cm) 0.086 Character of Wound/Ulcer Post Debridement: Improved Post Procedure Diagnosis Same as Pre-procedure Electronic Signature(s) Signed: 04/07/2022 10:33:41 AM By: Kalman Shan DO Signed: 04/07/2022 4:18:35 PM By: Deon Pilling RN, BSN Entered By: Deon Pilling on 04/07/2022 08:59:58 -------------------------------------------------------------------------------- Debridement Details Patient Name: Date of Service: Brian Leon, RO NN P. 04/07/2022 8:15 A M Medical Record Number: [id number] Patient Account Number: [id number] Date of Birth/Sex: Treating RN: Jun 18, 1946 (76 y.o. [name], [name] Primary Care Provider: [name] Other Clinician: Referring Provider: Treating Provider/Extender: [name] Weeks in Treatment: 21 Debridement Performed for Assessment: Wound #3 Right,Medial Lower Leg Performed By: Physician Kalman Shan, DO Debridement Type: Debridement Level of Consciousness (Pre-procedure): Awake and Alert Pre-procedure Verification/Time Out Yes - 08:50 Taken: Start Time: 08:51 Pain Control: Lidocaine 4% T opical Solution T Area Debrided (L x W): otal 1.5 (cm) x 2.5 (cm) = 3.75 (cm) Tissue and other material debrided: Viable, Non-Viable, Slough, Subcutaneous, Slough Level: Skin/Subcutaneous Tissue Debridement Description: Excisional Instrument: Curette Bleeding: Minimum Hemostasis Achieved: Pressure End Time: 09:00 Procedural Pain: 0 Post Procedural Pain: 0 Response to Treatment: Procedure was tolerated well Level of Consciousness (Post- Awake and Alert procedure): Post Debridement Measurements of Total Wound Length: (cm) 1.5 Width: (cm) 2.5 Depth: (cm) 0.1 Volume: (cm) 0.295 Character of Wound/Ulcer Post Debridement: Improved Post Procedure  Diagnosis Same as Pre-procedure Electronic Signature(s) Signed: 04/07/2022 10:33:41 AM By: Kalman Shan DO Signed: 04/07/2022 4:18:35 PM By: Deon Pilling RN, BSN Entered By: Deon Pilling on 04/07/2022 09:00:37 -------------------------------------------------------------------------------- Debridement Details Patient Name: Date of Service: Brian Leon, [place] NN P. 04/07/2022 8:15 A M Medical Record Number: [id number] Patient Account Number: [id number] Date of Birth/Sex: Treating RN: [birth year] (76 y.o. [name] Primary Care Provider: Other Clinician: [name] Referring Provider: Treating Provider/Extender: [name]  Weeks in Treatment: 21 Debridement Performed for Assessment: Wound #4 Right,Anterior Lower Leg Performed By: Physician Kalman Shan, DO Debridement Type: Debridement Level of Consciousness (Pre-procedure): Awake and Alert Pre-procedure Verification/Time Out Yes - 08:50 Taken: Start Time: 08:51 Pain Control: Lidocaine 4% T opical Solution T Area Debrided (L x W): otal 1.2 (cm) x 1.7 (cm) = 2.04 (cm) Tissue and other material debrided: Viable, Non-Viable, Slough, Subcutaneous, Slough Level: Skin/Subcutaneous Tissue Debridement Description: Excisional Instrument: Curette Bleeding: Minimum Hemostasis Achieved: Pressure End Time: 09:00 Procedural Pain: 0 Post Procedural Pain: 0 Response to Treatment: Procedure was tolerated well Level of Consciousness (Post- Awake and Alert procedure): Post Debridement Measurements of Total Wound Length: (cm) 1.2 Width: (cm) 1.7 Depth: (cm) 0.1 Volume: (cm) 0.16 Character of Wound/Ulcer Post Debridement: Improved Post Procedure Diagnosis Same as Pre-procedure Electronic Signature(s) Signed: 04/07/2022 10:33:41 AM By: Kalman Shan DO Signed: 04/07/2022 4:18:35 PM By: Deon Pilling RN, BSN Entered By: Deon Pilling on 04/07/2022  09:00:54 -------------------------------------------------------------------------------- Debridement Details Patient Name: Date of Service: Brian Leon, RO NN P. 04/07/2022 8:15 A M Medical Record Number: [id number] Patient Account Number: [id number] Date of Birth/Sex: Treating RN: 06/04/1946 (76 y.o. [name], [name] Primary Care Provider: [name] Other Clinician: Referring Provider: Treating Provider/Extender: [name] Weeks in Treatment: 21 Debridement Performed for Assessment: Wound #6 Right,Lateral Lower Leg Performed By: Physician Kalman Shan, DO Debridement Type: Debridement Level of Consciousness (Pre-procedure): Awake and Alert Pre-procedure Verification/Time Out Yes - 08:50 Taken: Start Time: 08:51 Pain Control: Lidocaine 4% T opical Solution T Area Debrided (L x W): otal 1.5 (cm) x 0.1 (cm) = 0.15 (cm) Tissue and other material debrided: Viable, Non-Viable, Slough, Subcutaneous, Slough Level: Skin/Subcutaneous Tissue Debridement Description: Excisional Instrument: Curette Bleeding: Minimum Hemostasis Achieved: Pressure End Time: 09:00 Procedural Pain: 0 Post Procedural Pain: 0 Response to Treatment: Procedure was tolerated well Level of Consciousness (Post- Awake and Alert procedure): Post Debridement Measurements of Total Wound Length: (cm) 1.5 Width: (cm) 0.1 Depth: (cm) 0.1 Volume: (cm) 0.012 Character of Wound/Ulcer Post Debridement: Improved Post Procedure Diagnosis Same as Pre-procedure Electronic Signature(s) Signed: 04/07/2022 10:33:41 AM By: Kalman Shan DO Signed: 04/07/2022 4:18:35 PM By: Deon Pilling RN, BSN Entered By: Deon Pilling on 04/07/2022 09:01:09 -------------------------------------------------------------------------------- HPI Details Patient Name: Date of Service: Brian Leon, RO NN P. 04/07/2022 8:15 A M Medical Record Number: [id number] Patient Account Number: [id number] Date of Birth/Sex: Treating  RN: 11/22/1945 (76 y.o. [name] Primary Care Provider: [name] Other Clinician: Referring Provider: Treating Provider/Extender: [name] Weeks in Treatment: 21 History of Present Illness HPI Description: Admission 11/11/2021 Mr. Alano Miera is a 76 year old male with a past medical history of polymyalgia rheumatica that presents to the clinic for a 48-monthhistory of nonhealing wound to the left lower extremity. He states that on Halloween he hit an object and created a wound that has not healed. He states he has seen dermatology and his primary care physician for this issue. On one occasion he had an UHaematologist He has also used Xeroform. It does not sound like he uses any kind of wound dressing consistently. He currently reports chronic pain to the wound site. He denies systemic signs of infection. He denies purulent drainage. 2/16; patient presents for follow-up. He has been using Hydrofera Blue and gentamicin ointment to the wound bed without issues. He reports improvement in wound healing. He currently denies signs of infection. 2/23; patient presents for follow-up. He continues to  use Hydrofera Blue and gentamicin ointment to the wound bed without issues. He denies signs of infection. He reports improvement in his chronic pain to the wound bed. 3/2; patient presents for follow-up. He has been using Hydrofera Blue and gentamicin to the wound bed without issues. He reports he is going to [place] for the next 3 weeks. He currently denies signs of infection. 3/24; patient presents for follow-up. He has been in [place] for the past 3 weeks for vacation. He enjoyed his trip. He reports no issues today. He has been using Hydrofera Blue and gentamicin ointment to the wound beds without issues. 4/7; patient presents for follow-up. He has been using Hydrofera Blue to the wound bed without issues. He reports that his wound is healed. 02/03/2022; patient was discharged  1 month ago with a closed wound to the left lower extremity. Unfortunately over the past month he has developed 3 new wounds he states was caused by hitting his legs against different objects. He has been keeping the areas covered with DuoDERM. He reports mild tenderness to the right lower extremity wound bed with increased redness. He denies purulent drainage. He states he has his previous wound care supplies of Hydrofera Blue and gentamicin ointment. 5/11; patient presents for follow-up. He had a PCR culture done at last clinic visit that showed high levels of Staph aureus and viridans and low levels of coagulase-negative staph. I started him on Augmentin at last clinic visit but he reports continued pain and erythema to the wound sites. He has been using gentamicin ointment and Hydrofera Blue with dressing changes. He also presents with a 100.2 temp cough and diarrhea. He has not taken anything for his symptoms. 5/18; patient presents for follow-up. He received his Keystone antibiotics and started using this. Unfortunately he mixed it incorrectly and it turned into a hardened substance. He had to use his refill to start all over again. He has only been using this for 1 day. He states he is combining this correctly now. He completed his course of clindamycin. He denies signs of infection. 5/25; patient presents for follow-up. He has been using Keystone antibiotics with Sorbact. He reports more serous drainage from the left lower extremity. Other than that he has no issues or complaints. He denies signs of infection. 6/1; patient presents for follow-up. We switched to Iodoflex and Keystone antibiotic under compression therapy at last clinic visit. He tolerated this treatment well. He has no issues or complaints today. He denies signs of infection. Unfortunately he will be out of town for the next 3 weeks. 6/22; patient presents for follow-up. He was in [place] for the past 3 weeks. He has been using  compression stockings daily along with [facility] antibiotic and Hydrofera Blue. Unfortunately he developed a skin tear to his right arm after hitting a door. 6/29; patient presents for follow-up. We have been using Keystone antibiotics with PolyMem silver to the lower extremities under 3 layer compression. Apparently he has a skin tear to the lateral Aspect of the right leg that he states was there previously. I had not seen it before. His skin tear on his right arm is well-healing with the use of bacitracin. He has no issues or complaints today. 7/6; patient presents for follow-up. We have been using Keystone antibiotics with PolyMem silver to the lower extremities under 3 layer compression. And antibiotic ointment to the right lateral leg wound. Patient reports improvement in wound healing. Electronic Signature(s) Signed: 04/07/2022 10:33:41 AM By: Kalman Shan DO  Entered By: Kalman Shan on 04/07/2022 10:30:12 -------------------------------------------------------------------------------- Physical Exam Details Patient Name: Date of Service: Brian Leon, [name] 04/07/2022 8:15 A M Medical Record Number: [id number] Patient Account Number: [id number] Date of Birth/Sex: Treating RN: 02-03-1946 (76 y.o. [name] Primary Care Provider: [name] Other Clinician: Referring Provider: Treating Provider/Extender: [name] Weeks in Treatment: 21 Constitutional respirations regular, non-labored and within target range for patient.. Cardiovascular 2+ dorsalis pedis/posterior tibialis pulses. Psychiatric pleasant and cooperative. Notes Multiple open wounds to the lower extremities bilaterally with mostly granulation tissue and some nonviable tissue present. Right arm: Epithelization to the previous wound site. No signs of surrounding soft tissue infection to any of the wound beds. Electronic Signature(s) Signed: 04/07/2022 10:33:41 AM By: Kalman Shan  DO Entered By: Kalman Shan on 04/07/2022 10:31:18 -------------------------------------------------------------------------------- Physician Orders Details Patient Name: Date of Service: Brian Leon, RO NN P. 04/07/2022 8:15 A M Medical Record Number: [id number] Patient Account Number: [id number] Date of Birth/Sex: Treating RN: 23-Mar-1946 (76 y.o. 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 - Dr. Heber [place] and [place], Room 8 04/14/2022 0815 Thursday Return A ppointment in 2 weeks. - Dr. Heber [place] and [place], Room 8 04/21/2022 0815 Thursday Return A Bathing/ Shower/ Hygiene May shower with protection but do not get wound dressing(s) wet. Edema Control - Lymphedema / SCD / Other Elevate legs to the level of the heart or above for 30 minutes daily and/or when sitting, a frequency of: - 3-4 times a day throughout the day. Avoid standing for long periods of time. Exercise regularly Moisturize legs daily. - every night before bed. do not get directly on wounds Wound Treatment Wound #2 - Lower Leg Wound Laterality: Left, Anterior Cleanser: Soap and Water 1 x Per Week/30 Days Discharge Instructions: May shower and wash wound with dial antibacterial soap and water prior to dressing change. Peri-Wound Care: Sween Lotion (Moisturizing lotion) 1 x Per Week/30 Days Discharge  Instructions: Apply moisturizing lotion as directed Topical: topical compounding antibiotics 1 x Per Week/30 Days Discharge Instructions: apply directly to wound bed. Prim Dressing: PolyMem Silver Non-Adhesive Dressing, 4.25x4.25 in 1 x Per Week/30 Days ary Discharge Instructions: Apply to wound bed as instructed Secondary Dressing: ABD Pad, 8x10 1 x Per Week/30 Days Discharge Instructions: Apply over primary dressing as directed. Compression Wrap: ThreePress (3 layer compression wrap) 1 x Per Week/30 Days Discharge Instructions: Apply three layer compression as directed. Wound #3 - Lower Leg Wound Laterality: Right, Medial Cleanser: Soap and Water 1 x Per Week/30 Days Discharge Instructions: May shower and wash wound with dial antibacterial soap and water prior to dressing change. Peri-Wound Care: Sween Lotion (Moisturizing lotion) 1 x Per Week/30 Days Discharge Instructions: Apply moisturizing lotion as directed Topical: topical compounding antibiotics 1 x Per Week/30 Days Discharge Instructions: apply directly to wound bed. Prim Dressing: PolyMem Silver Non-Adhesive Dressing, 4.25x4.25 in 1 x Per Week/30 Days ary Discharge Instructions: Apply to wound bed as instructed Secondary Dressing: ABD Pad,  8x10 1 x Per Week/30 Days Discharge Instructions: Apply over primary dressing as directed. Compression Wrap: ThreePress (3 layer compression wrap) 1 x Per Week/30 Days Discharge Instructions: Apply three layer compression as directed. Wound #4 - Lower Leg Wound Laterality: Right, Anterior Cleanser: Soap and Water 1 x Per Week/30 Days Discharge Instructions: May shower and wash wound with dial antibacterial soap and water prior to dressing change. Peri-Wound Care: Sween Lotion (Moisturizing lotion) 1 x Per Week/30 Days Discharge Instructions: Apply moisturizing lotion as directed Topical: topical compounding antibiotics 1 x Per Week/30 Days Discharge Instructions: apply directly to wound  bed. Prim Dressing: PolyMem Silver Non-Adhesive Dressing, 4.25x4.25 in 1 x Per Week/30 Days ary Discharge Instructions: Apply to wound bed as instructed Secondary Dressing: ABD Pad, 8x10 1 x Per Week/30 Days Discharge Instructions: Apply over primary dressing as directed. Compression Wrap: ThreePress (3 layer compression wrap) 1 x Per Week/30 Days Discharge Instructions: Apply three layer compression as directed. Wound #6 - Lower Leg Wound Laterality: Right, Lateral Cleanser: Soap and Water 1 x Per Week/30 Days Discharge Instructions: May shower and wash wound with dial antibacterial soap and water prior to dressing change. Peri-Wound Care: Sween Lotion (Moisturizing lotion) 1 x Per Week/30 Days Discharge Instructions: Apply moisturizing lotion as directed Topical: bacitracin 1 x Per Week/30 Days Discharge Instructions: apply to wound bed. Secondary Dressing: ABD Pad, 8x10 1 x Per Week/30 Days Discharge Instructions: Apply over primary dressing as directed. Compression Wrap: ThreePress (3 layer compression wrap) 1 x Per Week/30 Days Discharge Instructions: Apply three layer compression as directed. Electronic Signature(s) Signed: 04/07/2022 10:33:41 AM By: Kalman Shan DO Entered By: Kalman Shan on 04/07/2022 10:31:27 -------------------------------------------------------------------------------- Problem List Details Patient Name: Date of Service: Brian Leon, RO NN P. 04/07/2022 8:15 A M Medical Record Number: [id number] Patient Account Number: [id number] Date of Birth/Sex: Treating RN: Feb 15, 1946 (76 y.o. [name], [name] Primary Care Provider: [name] Other Clinician: Referring Provider: Treating Provider/Extender: [name] Weeks in Treatment: 21 Active Problems ICD-10 Encounter Code Description Active Date MDM Diagnosis L97.822 Non-pressure chronic ulcer of other part of left lower leg with fat layer exposed2/06/2022 No Yes L97.912  Non-pressure chronic ulcer of unspecified part of right lower leg with fat layer 02/03/2022 No Yes exposed S41.101A Unspecified open wound of right upper arm, initial encounter 03/24/2022 No Yes T14.90XA Injury, unspecified, initial encounter 11/11/2021 No Yes M35.3 Polymyalgia rheumatica 11/11/2021 No Yes I87.2 Venous insufficiency (chronic) (peripheral) 02/24/2022 No Yes Inactive Problems Resolved Problems Electronic Signature(s) Signed: 04/07/2022 10:33:41 AM By: Kalman Shan DO Entered By: Kalman Shan on 04/07/2022 10:23:51 -------------------------------------------------------------------------------- Progress Note Details Patient Name: Date of Service: Brian Leon, RO NN P. 04/07/2022 8:15 A M Medical Record Number: [id number] Patient Account Number: [id number] Date of Birth/Sex: Treating RN: 04-23-1946 (76 y.o. [name] Primary Care Provider: [name] Other Clinician: Referring Provider: Treating Provider/Extender: [name] Weeks in Treatment: 21 Subjective Chief Complaint Information obtained from Patient 11/11/2021; Left lower extremity wound due to trauma 02/03/2022; bilateral lower extremity wounds due to trauma History of Present Illness (HPI) Admission 11/11/2021 Mr. Pamela Farrington is a 76 year old male with a past medical history of polymyalgia rheumatica that presents to the clinic for a 69-monthhistory of nonhealing wound to the left lower extremity. He states that on Halloween he hit an object and created a wound that has not healed. He states he has seen dermatology and his primary care physician for this issue. On one  occasion he had an Haematologist. He has also used Xeroform. It does not sound like he uses any kind of wound dressing consistently. He currently reports chronic pain to the wound site. He denies systemic signs of infection. He denies purulent drainage. 2/16; patient presents for follow-up. He has been using Hydrofera Blue and  gentamicin ointment to the wound bed without issues. He reports improvement in wound healing. He currently denies signs of infection. 2/23; patient presents for follow-up. He continues to use Hydrofera Blue and gentamicin ointment to the wound bed without issues. He denies signs of infection. He reports improvement in his chronic pain to the wound bed. 3/2; patient presents for follow-up. He has been using Hydrofera Blue and gentamicin to the wound bed without issues. He reports he is going to [place] for the next 3 weeks. He currently denies signs of infection. 3/24; patient presents for follow-up. He has been in [place] for the past 3 weeks for vacation. He enjoyed his trip. He reports no issues today. He has been using Hydrofera Blue and gentamicin ointment to the wound beds without issues. 4/7; patient presents for follow-up. He has been using Hydrofera Blue to the wound bed without issues. He reports that his wound is healed. 02/03/2022; patient was discharged 1 month ago with a closed wound to the left lower extremity. Unfortunately over the past month he has developed 3 new wounds he states was caused by hitting his legs against different objects. He has been keeping the areas covered with DuoDERM. He reports mild tenderness to the right lower extremity wound bed with increased redness. He denies purulent drainage. He states he has his previous wound care supplies of Hydrofera Blue and gentamicin ointment. 5/11; patient presents for follow-up. He had a PCR culture done at last clinic visit that showed high levels of Staph aureus and viridans and low levels of coagulase-negative staph. I started him on Augmentin at last clinic visit but he reports continued pain and erythema to the wound sites. He has been using gentamicin ointment and Hydrofera Blue with dressing changes. He also presents with a 100.2 temp cough and diarrhea. He has not taken anything for his symptoms. 5/18; patient presents  for follow-up. He received his Keystone antibiotics and started using this. Unfortunately he mixed it incorrectly and it turned into a hardened substance. He had to use his refill to start all over again. He has only been using this for 1 day. He states he is combining this correctly now. He completed his course of clindamycin. He denies signs of infection. 5/25; patient presents for follow-up. He has been using Keystone antibiotics with Sorbact. He reports more serous drainage from the left lower extremity. Other than that he has no issues or complaints. He denies signs of infection. 6/1; patient presents for follow-up. We switched to Iodoflex and Keystone antibiotic under compression therapy at last clinic visit. He tolerated this treatment well. He has no issues or complaints today. He denies signs of infection. Unfortunately he will be out of town for the next 3 weeks. 6/22; patient presents for follow-up. He was in [place] for the past 3 weeks. He has been using compression stockings daily along with [facility] antibiotic and Hydrofera Blue. Unfortunately he developed a skin tear to his right arm after hitting a door. 6/29; patient presents for follow-up. We have been using [facility] antibiotics with PolyMem silver to the lower extremities under 3 layer compression. Apparently he has a skin tear to the lateral Aspect of  the right leg that he states was there previously. I had not seen it before. His skin tear on his right arm is well-healing with the use of bacitracin. He has no issues or complaints today. 7/6; patient presents for follow-up. We have been using [facility] antibiotics with PolyMem silver to the lower extremities under 3 layer compression. And antibiotic ointment to the right lateral leg wound. Patient reports improvement in wound healing. Patient History Information obtained from Chart. Family History Cancer - Mother, No family history of Diabetes, Heart Disease, Hypertension,  Kidney Disease, Lung Disease, Seizures, Stroke, Thyroid Problems, Tuberculosis. Social History Former smoker - quit 40 years ago, Marital Status - Married, Alcohol Use - Never, Drug Use - Current History - marijuana, Caffeine Use - Rarely. Medical History Eyes Denies history of Cataracts, Glaucoma, Optic Neuritis Ear/Nose/Mouth/Throat Denies history of Chronic sinus problems/congestion, Middle ear problems Hematologic/Lymphatic Denies history of Anemia, Hemophilia, Human Immunodeficiency Virus, Lymphedema, Sickle Cell Disease Respiratory Patient has history of Asthma Denies history of Aspiration, Chronic Obstructive Pulmonary Disease (COPD), Pneumothorax, Sleep Apnea, Tuberculosis Cardiovascular Patient has history of Hypertension Denies history of Angina, Arrhythmia, Congestive Heart Failure, Coronary Artery Disease, Deep Vein Thrombosis, Hypotension, Myocardial Infarction, Peripheral Arterial Disease, Peripheral Venous Disease, Phlebitis, Vasculitis Gastrointestinal Patient has history of Hepatitis A Denies history of Cirrhosis , Colitis, Crohnoos, Hepatitis B, Hepatitis C Endocrine Denies history of Type I Diabetes, Type II Diabetes Genitourinary Denies history of End Stage Renal Disease Immunological Denies history of Lupus Erythematosus, Raynaudoos, Scleroderma Integumentary (Skin) Denies history of History of Burn Musculoskeletal Patient has history of Rheumatoid Arthritis Denies history of Gout, Osteoarthritis, Osteomyelitis Psychiatric Denies history of Anorexia/bulimia, Confinement Anxiety Hospitalization/Surgery History - 5 years ago Renal Cell carcinoma. 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 Vitals Time Taken: 8:15 AM, Height: 68 in, Weight: 140 lbs, BMI: 21.3, Pulse: 57 bpm, Respiratory Rate: 16 breaths/min, Blood Pressure: 117/68 mmHg. General Notes: unable to check temp related to had cold drink. Cardiovascular 2+ dorsalis pedis/posterior tibialis pulses. Psychiatric pleasant and cooperative. General Notes: Multiple open wounds to the lower extremities bilaterally with mostly granulation tissue and some nonviable tissue present. Right arm: Epithelization to the previous wound site. No signs of surrounding soft tissue infection to any of the wound beds. Integumentary (Hair, Skin) Wound #2 status is Open. Original cause of wound was Trauma. The date acquired was: 01/04/2022. The wound has been in treatment 9 weeks. The wound is located on the Left,Anterior Lower Leg. The wound measures 1.1cm length x 1cm width x 0.1cm depth; 0.864cm^2 area and 0.086cm^3 volume. There is Fat Layer (Subcutaneous Tissue) exposed. There is no tunneling or undermining noted. There is a medium amount of serosanguineous drainage noted. The wound margin is distinct with the outline attached to the wound base. There is large (67-100%) pink granulation within the wound bed. There is a small (1-33%) amount of necrotic tissue within the wound bed including Adherent Slough. Wound #3 status is Open. Original cause of wound was Trauma. The date acquired was: 01/24/2022. The wound has been in treatment 9 weeks. The wound is located on the Right,Medial Lower Leg. The wound measures 1.5cm length x  2.5cm width x 0.1cm depth; 2.945cm^2 area and 0.295cm^3 volume. There is Fat Layer (Subcutaneous Tissue) exposed. There is no tunneling or undermining noted.  There is a medium amount of serosanguineous drainage noted. The wound margin is distinct with the outline attached to the wound base. There is large (67-100%) red, pink granulation within the wound bed. There is a small (1-33%) amount of necrotic tissue within the wound bed including Adherent Slough. Wound #4 status is Open. Original cause of wound was Trauma. The date acquired was: 01/04/2022. The wound has been in treatment 9 weeks. The wound is located on the Right,Anterior Lower Leg. The wound measures 1.2cm length x 1.7cm width x 0.1cm depth; 1.602cm^2 area and 0.16cm^3 volume. There is Fat Layer (Subcutaneous Tissue) exposed. There is no tunneling or undermining noted. There is a medium amount of serosanguineous drainage noted. The wound margin is distinct with the outline attached to the wound base. There is large (67-100%) pink granulation within the wound bed. There is a small (1-33%) amount of necrotic tissue within the wound bed including Adherent Slough. Wound #5 status is Healed - Epithelialized. Original cause of wound was Trauma. The date acquired was: 03/22/2022. The wound has been in treatment 2 weeks. The wound is located on the Right Elbow. The wound measures 0cm length x 0cm width x 0cm depth; 0cm^2 area and 0cm^3 volume. There is no tunneling or undermining noted. There is a none present amount of drainage noted. The wound margin is distinct with the outline attached to the wound base. There is no granulation within the wound bed. There is no necrotic tissue within the wound bed. Wound #6 status is Open. Original cause of wound was Laceration. The date acquired was: 03/25/2022. The wound has been in treatment 1 weeks. The wound is located on the Right,Lateral Lower Leg. The wound measures 1.5cm length x 0.1cm width x 0.1cm depth; 0.118cm^2 area and 0.012cm^3 volume. There is Fat Layer (Subcutaneous Tissue) exposed. There is no tunneling or undermining noted. There is a medium amount of  serosanguineous drainage noted. The wound margin is distinct with the outline attached to the wound base. There is large (67-100%) red granulation within the wound bed. There is no necrotic tissue within the wound bed. 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 Some of the wound beds are smaller as well. I debrided nonviable tissue. I recommended continuing PolyMem silver throughout with [facility] antibiotic except for the right lateral leg we can continue bacitracin ointment. Continue compression therapy. The right arm wound has healed. Follow-up in 1 week. Procedures Wound #2 Pre-procedure diagnosis of Wound #2 is an Abrasion located on the Left,Anterior Lower Leg . There was a Excisional Skin/Subcutaneous Tissue Debridement with a total area of 1.1 sq cm performed by Kalman Shan, DO. With the following instrument(s): Curette to remove Viable and Non-Viable tissue/material. Material removed includes Subcutaneous Tissue and Slough and after achieving pain control using Lidocaine 4% T opical Solution. A time out was conducted at 08:50, prior to the start of the procedure. A Minimum amount of bleeding was controlled with Pressure. The procedure was tolerated well with a pain level of 0 throughout and a pain level  of 0 following the procedure. Post Debridement Measurements: 1.1cm length x 1cm width x 0.1cm depth; 0.086cm^3 volume. Character of Wound/Ulcer Post Debridement is improved. Post procedure Diagnosis Wound #2: Same as Pre-Procedure Pre-procedure diagnosis of Wound #2 is an Abrasion  located on the Left,Anterior Lower Leg . There was a Three Layer Compression Therapy Procedure by Deon Pilling, RN. Post procedure Diagnosis Wound #2: Same as Pre-Procedure Wound #3 Pre-procedure diagnosis of Wound #3 is an Abrasion located on the Right,Medial Lower Leg . There was a Excisional Skin/Subcutaneous Tissue Debridement with a total area of 3.75 sq cm performed by Kalman Shan, DO. With the following instrument(s): Curette to remove Viable and Non-Viable tissue/material. Material removed includes Subcutaneous Tissue and Slough and after achieving pain control using Lidocaine 4% T opical Solution. A time out was conducted at 08:50, prior to the start of the procedure. A Minimum amount of bleeding was controlled with Pressure. The procedure was tolerated well with a pain level of 0 throughout and a pain level of 0 following the procedure. Post Debridement Measurements: 1.5cm length x 2.5cm width x 0.1cm depth; 0.295cm^3 volume. Character of Wound/Ulcer Post Debridement is improved. Post procedure Diagnosis Wound #3: Same as Pre-Procedure Pre-procedure diagnosis of Wound #3 is an Abrasion located on the Right,Medial Lower Leg . There was a Three Layer Compression Therapy Procedure by Deon Pilling, RN. Post procedure Diagnosis Wound #3: Same as Pre-Procedure Wound #4 Pre-procedure diagnosis of Wound #4 is an Abrasion located on the Right,Anterior Lower Leg . There was a Excisional Skin/Subcutaneous Tissue Debridement with a total area of 2.04 sq cm performed by Kalman Shan, DO. With the following instrument(s): Curette to remove Viable and Non-Viable tissue/material. Material removed includes Subcutaneous Tissue and Slough and after achieving pain control using Lidocaine 4% T opical Solution. A time out was conducted at 08:50, prior to the start of the procedure. A Minimum amount of bleeding was controlled with Pressure. The procedure was tolerated well with a pain level of 0  throughout and a pain level of 0 following the procedure. Post Debridement Measurements: 1.2cm length x 1.7cm width x 0.1cm depth; 0.16cm^3 volume. Character of Wound/Ulcer Post Debridement is improved. Post procedure Diagnosis Wound #4: Same as Pre-Procedure Pre-procedure diagnosis of Wound #4 is an Abrasion located on the Right,Anterior Lower Leg . There was a Three Layer Compression Therapy Procedure by Deon Pilling, RN. Post procedure Diagnosis Wound #4: Same as Pre-Procedure Wound #6 Pre-procedure diagnosis of Wound #6 is a Skin T located on the Right,Lateral Lower Leg . There was a Excisional Skin/Subcutaneous Tissue Debridement ear with a total area of 0.15 sq cm performed by Kalman Shan, DO. With the following instrument(s): Curette to remove Viable and Non-Viable tissue/material. Material removed includes Subcutaneous Tissue and Slough and after achieving pain control using Lidocaine 4% T opical Solution. A time out was conducted at 08:50, prior to the start of the procedure. A Minimum amount of bleeding was controlled with Pressure. The procedure was tolerated well with a pain level of 0 throughout and a pain level of 0 following the procedure. Post Debridement Measurements: 1.5cm length x 0.1cm width x 0.1cm depth; 0.012cm^3 volume. Character of Wound/Ulcer Post Debridement is improved. Post procedure Diagnosis Wound #6: Same as Pre-Procedure Pre-procedure diagnosis of Wound #6 is a Skin T located on the Right,Lateral Lower Leg . There was a Three Layer Compression Therapy Procedure by ear Deon Pilling, RN. Post procedure Diagnosis Wound #6: Same as Pre-Procedure Plan Follow-up Appointments: Return Appointment in 1  week. - Dr. Heber [place] and [place], Room 8 04/14/2022 0815 Thursday Return Appointment in 2 weeks. - Dr. Heber [place] and [place], Room 8 04/21/2022 0815 Thursday Bathing/ Shower/ Hygiene: May shower with protection but do not get wound dressing(s) wet. Edema Control -  Lymphedema / SCD / Other: Elevate legs to the level of the heart or above for 30 minutes daily and/or when sitting, a frequency of: - 3-4 times a day throughout the day. Avoid standing for long periods of time. Exercise regularly Moisturize legs daily. - every night before bed. do not get directly on wounds WOUND #2: - Lower Leg Wound Laterality: Left, Anterior Cleanser: Soap and Water 1 x Per Week/30 Days Discharge Instructions: May shower and wash wound with dial antibacterial soap and water prior to dressing change. Peri-Wound Care: Sween Lotion (Moisturizing lotion) 1 x Per Week/30 Days Discharge Instructions: Apply moisturizing lotion as directed Topical: topical compounding antibiotics 1 x Per Week/30 Days Discharge Instructions: apply directly to wound bed. Prim Dressing: PolyMem Silver Non-Adhesive Dressing, 4.25x4.25 in 1 x Per Week/30 Days ary Discharge Instructions: Apply to wound bed as instructed Secondary Dressing: ABD Pad, 8x10 1 x Per Week/30 Days Discharge Instructions: Apply over primary dressing as directed. Com pression Wrap: ThreePress (3 layer compression wrap) 1 x Per Week/30 Days Discharge Instructions: Apply three layer compression as directed. WOUND #3: - Lower Leg Wound Laterality: Right, Medial Cleanser: Soap and Water 1 x Per Week/30 Days Discharge Instructions: May shower and wash wound with dial antibacterial soap and water prior to dressing change. Peri-Wound Care: Sween Lotion (Moisturizing lotion) 1 x Per Week/30 Days Discharge Instructions: Apply moisturizing lotion as directed Topical: topical compounding antibiotics 1 x Per Week/30 Days Discharge Instructions: apply directly to wound bed. Prim Dressing: PolyMem Silver Non-Adhesive Dressing, 4.25x4.25 in 1 x Per Week/30 Days ary Discharge Instructions: Apply to wound bed as instructed Secondary Dressing: ABD Pad, 8x10 1 x Per Week/30 Days Discharge Instructions: Apply over primary dressing as  directed. Com pression Wrap: ThreePress (3 layer compression wrap) 1 x Per Week/30 Days Discharge Instructions: Apply three layer compression as directed. WOUND #4: - Lower Leg Wound Laterality: Right, Anterior Cleanser: Soap and Water 1 x Per Week/30 Days Discharge Instructions: May shower and wash wound with dial antibacterial soap and water prior to dressing change. Peri-Wound Care: Sween Lotion (Moisturizing lotion) 1 x Per Week/30 Days Discharge Instructions: Apply moisturizing lotion as directed Topical: topical compounding antibiotics 1 x Per Week/30 Days Discharge Instructions: apply directly to wound bed. Prim Dressing: PolyMem Silver Non-Adhesive Dressing, 4.25x4.25 in 1 x Per Week/30 Days ary Discharge Instructions: Apply to wound bed as instructed Secondary Dressing: ABD Pad, 8x10 1 x Per Week/30 Days Discharge Instructions: Apply over primary dressing as directed. Com pression Wrap: ThreePress (3 layer compression wrap) 1 x Per Week/30 Days Discharge Instructions: Apply three layer compression as directed. WOUND #6: - Lower Leg Wound Laterality: Right, Lateral Cleanser: Soap and Water 1 x Per Week/30 Days Discharge Instructions: May shower and wash wound with dial antibacterial soap and water prior to dressing change. Peri-Wound Care: Sween Lotion (Moisturizing lotion) 1 x Per Week/30 Days Discharge Instructions: Apply moisturizing lotion as directed Topical: bacitracin 1 x Per Week/30 Days Discharge Instructions: apply to wound bed. Secondary Dressing: ABD Pad, 8x10 1 x Per Week/30 Days Discharge Instructions: Apply over primary dressing as directed. Com pression Wrap: ThreePress (3 layer compression wrap) 1 x Per Week/30 Days Discharge Instructions: Apply three layer compression as directed. 1. In office sharp  debridement 2. PolyMem silver with [facility] antibiotic under 3 layer compression bilaterally 3. Follow-up in 1 week 4. Bacitracin ointment Electronic  Signature(s) Signed: 04/07/2022 10:33:41 AM By: Kalman Shan DO Entered By: Kalman Shan on 04/07/2022 10:32:45 -------------------------------------------------------------------------------- HxROS Details Patient Name: Date of Service: Brian Leon, RO NN P. 04/07/2022 8:15 A M Medical Record Number: [id number] Patient Account Number: [id number] Date of Birth/Sex: Treating RN: 06-03-1946 (76 y.o. [name] Primary Care Provider: [name] Other Clinician: Referring Provider: Treating Provider/Extender: [name] Weeks in Treatment: 21 Information Obtained From Chart Eyes Medical History: Negative for: Cataracts; Glaucoma; Optic Neuritis Past Medical History Notes: Blind right eye WEARS PROSTHESIS Ear/Nose/Mouth/Throat Medical History: Negative for: Chronic sinus problems/congestion; Middle ear problems Past Medical History Notes: Allergic rhinitis Hematologic/Lymphatic Medical History: Negative for: Anemia; Hemophilia; Human Immunodeficiency Virus; Lymphedema; Sickle Cell Disease Respiratory Medical History: Positive for: Asthma Negative for: Aspiration; Chronic Obstructive Pulmonary Disease (COPD); Pneumothorax; Sleep Apnea; Tuberculosis Past Medical History Notes: Pneumonia Cardiovascular Medical History: Positive for: Hypertension Negative for: Angina; Arrhythmia; Congestive Heart Failure; Coronary Artery Disease; Deep Vein Thrombosis; Hypotension; Myocardial Infarction; Peripheral Arterial Disease; Peripheral Venous Disease; Phlebitis; Vasculitis Past Medical History Notes: Thoracic aortic aneurysm Thrombocytosis Gastrointestinal Medical History: Positive for: Hepatitis A Negative for: Cirrhosis ; Colitis; Crohns; Hepatitis B; Hepatitis C Endocrine Medical History: Negative for: Type I Diabetes; Type II Diabetes Genitourinary Medical History: Negative for: End Stage Renal Disease Past Medical History Notes: 1/3kidney  missing per patient-Renal Cell carcinoma Renal Cyst 5 years ago Immunological Medical History: Negative for: Lupus Erythematosus; Raynauds; Scleroderma Integumentary (Skin) Medical History: Negative for: History of Burn Musculoskeletal Medical History: Positive for: Rheumatoid Arthritis Negative for: Gout; Osteoarthritis; Osteomyelitis Past Medical History Notes: 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 Eczema Neurologic Medical History: Past Medical History Notes: Paraureteric diverticulum Oncologic Medical History: Past Medical History Notes: Renal Cell carcinoma- 5 years ago Psychiatric Medical History: Negative for: Anorexia/bulimia; Confinement Anxiety Immunizations Pneumococcal Vaccine: Received Pneumococcal Vaccination: Yes Received Pneumococcal Vaccination On or After 60th Birthday: Yes Implantable Devices No devices added Hospitalization / Surgery History Type of Hospitalization/Surgery 5 years ago Renal Cell carcinoma Family and Social History Cancer: Yes - Mother; Diabetes: No; Heart Disease: No; Hypertension: No; Kidney Disease: No; Lung Disease: No; Seizures: No; Stroke: No; Thyroid Problems: No; Tuberculosis: No; Former smoker - quit 40 years ago; Marital Status - Married; Alcohol Use: Never; Drug Use: Current History - marijuana; Caffeine Use: Rarely; Financial Concerns: No; Food, Clothing or Shelter Needs: No; Support System Lacking: No; Transportation Concerns: No Electronic Signature(s) Signed: 04/07/2022 10:33:41 AM By: Kalman Shan DO Signed: 04/07/2022 4:18:35 PM By: Deon Pilling RN, BSN Entered By: Kalman Shan on 04/07/2022 10:30:17 -------------------------------------------------------------------------------- SuperBill Details Patient Name: Date of Service: Brian Leon, RO NN P. 04/07/2022 Medical Record Number: [id number] Patient Account Number: [id number] Date of Birth/Sex: Treating  RN: 09-22-1946 (76 y.o. 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 S41.101A Unspecified open wound of right upper arm, initial encounter T14.90XA Injury, unspecified, initial encounter M35.3 Polymyalgia rheumatica I87.2 Venous insufficiency (chronic) (peripheral) Facility Procedures CPT4 Code: [id number] Description: 84132 - DEB SUBQ TISSUE 20 SQ CM/< ICD-10 Diagnosis Description L97.822  Non-pressure chronic ulcer of other part of left lower leg with fat layer expo L97.912 Non-pressure chronic ulcer of unspecified part of right lower leg with fat lay Modifier: sed er exposed Quantity: 1 Physician Procedures : CPT4 Code Description Modifier [id number] - WC PHYS SUBQ TISS 20 SQ CM ICD-10 Diagnosis Description L97.822 Non-pressure chronic ulcer of other part of left lower leg with fat layer exposed L97.912 Non-pressure chronic ulcer of unspecified part of  right lower leg with fat layer exposed Quantity: 1 Electronic Signature(s) Signed: 04/07/2022 10:33:41 AM By: Kalman Shan DO Entered By: Kalman Shan on 04/07/2022 10:33:01

## 2022-04-08 LAB — GIARDIA, EIA; OVA/PARASITE: Giardia Ag, Stl: NEGATIVE

## 2022-04-08 LAB — C DIFFICILE TOXINS A+B W/RFLX: C difficile Toxins A+B, EIA: POSITIVE — AB

## 2022-04-11 ENCOUNTER — Telehealth: Payer: Self-pay | Admitting: Family Medicine

## 2022-04-11 ENCOUNTER — Ambulatory Visit (INDEPENDENT_AMBULATORY_CARE_PROVIDER_SITE_OTHER): Payer: Medicare Other | Admitting: Cardiology

## 2022-04-11 ENCOUNTER — Encounter: Payer: Self-pay | Admitting: Cardiology

## 2022-04-11 ENCOUNTER — Ambulatory Visit (INDEPENDENT_AMBULATORY_CARE_PROVIDER_SITE_OTHER): Payer: Medicare Other

## 2022-04-11 VITALS — BP 120/64 | HR 52 | Ht 69.5 in | Wt 138.0 lb

## 2022-04-11 DIAGNOSIS — I10 Essential (primary) hypertension: Secondary | ICD-10-CM | POA: Diagnosis not present

## 2022-04-11 DIAGNOSIS — I7121 Aneurysm of the ascending aorta, without rupture: Secondary | ICD-10-CM | POA: Diagnosis not present

## 2022-04-11 DIAGNOSIS — A0472 Enterocolitis due to Clostridium difficile, not specified as recurrent: Secondary | ICD-10-CM

## 2022-04-11 DIAGNOSIS — E7800 Pure hypercholesterolemia, unspecified: Secondary | ICD-10-CM

## 2022-04-11 DIAGNOSIS — R55 Syncope and collapse: Secondary | ICD-10-CM

## 2022-04-11 MED ORDER — VANCOMYCIN HCL 125 MG PO CAPS: 125.0000 mg | ORAL_CAPSULE | Freq: Four times a day (QID) | ORAL | 0 refills | Status: AC
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
## 2022-04-11 NOTE — Telephone Encounter (Signed)
Patient with diarrhea, recently tested positive for c. Diff. Will treat with oral vancomycin 125 mg QID x10 days. Recommend he make a follow up appt for 1 week after completion of treatment. Left HIPAA compliant VM, ok to let patient know this information if he calls back.   Gladys Damme, MD [facility], PGY-3

## 2022-04-11 NOTE — Patient Instructions (Signed)
Medication Instructions:  Your physician recommends that you continue on your current medications as directed. Please refer to the Current Medication list given to you today.  *If you need a refill on your cardiac medications before your next appointment, please call your pharmacy*   Lab Work: None Ordered If you have labs (blood work) drawn today and your tests are completely normal, you will receive your results only by: MyChart Message (if you have MyChart) OR A paper copy in the mail If you have any lab test that is abnormal or we need to change your treatment, we will call you to review the results.   Testing/Procedures:  WHY IS MY DOCTOR PRESCRIBING ZIO? The Zio system is proven and trusted by physicians to detect and diagnose irregular heart rhythms -- and has been prescribed to hundreds of thousands of patients.  The FDA has cleared the Zio system to monitor for many different kinds of irregular heart rhythms. In a study, physicians were able to reach a diagnosis 90% of the time with the Zio system1.  You can wear the Zio monitor -- a small, discreet, comfortable patch -- during your normal day-to-day activity, including while you sleep, shower, and exercise, while it records every single heartbeat for analysis.  1Barrett, P., et al. Comparison of 24 Hour Holter Monitoring Versus 14 Day Novel Adhesive Patch Electrocardiographic Monitoring. American Journal of Medicine, 2014.  ZIO VS. HOLTER MONITORING The Zio monitor can be comfortably worn for up to 14 days. Holter monitors can be worn for 24 to 48 hours, limiting the time to record any irregular heart rhythms you may have. Zio is able to capture data for the 51% of patients who have their first symptom-triggered arrhythmia after 48 hours.1  LIVE WITHOUT RESTRICTIONS The Zio ambulatory cardiac monitor is a small, unobtrusive, and water-resistant patch--you might even forget you're wearing it. The Zio monitor records and stores  every beat of your heart, whether you're sleeping, working out, or showering.     Follow-Up: At [facility], you and your health needs are our priority.  As part of our continuing mission to provide you with exceptional heart care, we have created designated Provider Care Teams.  These Care Teams include your primary Cardiologist (physician) and Advanced Practice Providers (APPs -  Physician Assistants and Nurse Practitioners) who all work together to provide you with the care you need, when you need it.  We recommend signing up for the patient portal called "MyChart".  Sign up information is provided on this After Visit Summary.  MyChart is used to connect with patients for Virtual Visits (Telemedicine).  Patients are able to view lab/test results, encounter notes, upcoming appointments, etc.  Non-urgent messages can be sent to your provider as well.   To learn more about what you can do with MyChart, go to [email or web address]://[email or web address].com.    Your next appointment:   6 month(s)  The format for your next appointment:   In Person  Provider:   Robert Krasowski, MD    Other Instructions NA  

## 2022-04-12 NOTE — Telephone Encounter (Signed)
Patient returns call to nurse line. Informed of message from Dr. Chauncey Reading.   Scheduled patient with new PCP Markus Jarvis) on 7/31.  Talbot Grumbling, RN

## 2022-04-14 ENCOUNTER — Encounter (HOSPITAL_BASED_OUTPATIENT_CLINIC_OR_DEPARTMENT_OTHER): Payer: Medicare Other | Admitting: Internal Medicine

## 2022-04-14 DIAGNOSIS — L97822 Non-pressure chronic ulcer of other part of left lower leg with fat layer exposed: Secondary | ICD-10-CM

## 2022-04-14 DIAGNOSIS — L97912 Non-pressure chronic ulcer of unspecified part of right lower leg with fat layer exposed: Secondary | ICD-10-CM | POA: Diagnosis not present

## 2022-04-14 NOTE — Progress Notes (Signed)
DARYON, REMMERT ([id number]) Visit Report for 04/14/2022 Arrival Information Details Patient Name: Date of Service: [name], [name] 04/14/2022 8:15 A M Medical Record Number: [id number] Patient Account Number: [id number] Date of Birth/Sex: Treating RN: May 28, 1946 (76 y.o. [name], [name] Primary Care Markhi Kleckner: [name] Other Clinician: Referring Mariya Mottley: Treating Sumeya Yontz/Extender: [name] Weeks in Treatment: 86 Visit Information History Since Last Visit All ordered tests and consults were completed: No Patient Arrived: Ambulatory Added or deleted any medications: Yes Arrival Time: 08:10 Any new allergies or adverse reactions: No Accompanied By: self Had a fall or experienced change in No Transfer Assistance: None activities of daily living that may affect Patient Identification Verified: Yes risk of falls: Secondary Verification Process Completed: Yes Signs or symptoms of abuse/neglect since last visito No Patient Requires Transmission-Based Precautions: No Hospitalized since last visit: No Patient Has Alerts: Yes Implantable device outside of the clinic excluding No Patient Alerts: 11/02/21 L ABI: 1.37 cellular tissue based products placed in the center 11/02/2021 L TBI 0.77 since last visit: 11/02/2021 R TBI 0.69 Has Dressing in Place as Prescribed: Yes 11/02/2021 R ABI 1.3 Has Compression in Place as Prescribed: Yes Pain Present Now: No Notes Patient has C.Diff. PCP dx. Started vancomycin Tuesday QID x10days. Electronic Signature(s) Signed: 04/14/2022 4:34:49 PM By: Deon Pilling RN, BSN Entered By: Deon Pilling on 04/14/2022 08:11:11 -------------------------------------------------------------------------------- Encounter Discharge Information Details Patient Name: Date of Service: [name], RO NN P. 04/14/2022 8:15 A M Medical Record Number: [id number] Patient Account Number: [id number] Date of Birth/Sex: Treating RN: Nov 15, 1945 (76 y.o.  [name] Primary Care Amedeo Detweiler: [name] Other Clinician: Referring Memori Sammon: Treating Journei Thomassen/Extender: [name] Weeks in Treatment: 42 Encounter Discharge Information Items Post Procedure Vitals Discharge Condition: Stable Temperature (F): 98.1 Ambulatory Status: Ambulatory Pulse (bpm): 65 Discharge Destination: Home Respiratory Rate (breaths/min): 20 Transportation: Private Auto Blood Pressure (mmHg): 118/70 Accompanied By: self Schedule Follow-up Appointment: Yes Clinical Summary of Care: Electronic Signature(s) Signed: 04/14/2022 4:34:49 PM By: Deon Pilling RN, BSN Entered By: Deon Pilling on 04/14/2022 08:40:41 -------------------------------------------------------------------------------- Lower Extremity Assessment Details Patient Name: Date of Service: [name], RO NN P. 04/14/2022 8:15 A M Medical Record Number: [id number] Patient Account Number: [id number] Date of Birth/Sex: Treating RN: 04/16/1946 (76 y.o. [name] Primary Care Brynlyn Dade: [name] Other Clinician: Referring Ladine Kiper: Treating Braydan Marriott/Extender: [name] Weeks in Treatment: 22 Edema Assessment Assessed: [Left: Yes] [Right: Yes] Edema: [Left: No] [Right: No] Calf Left: Right: Point of Measurement: 37 cm From Medial Instep 28 cm 28 cm Ankle Left: Right: Point of Measurement: 12 cm From Medial Instep 18 cm 19 cm Vascular Assessment Pulses: Dorsalis Pedis Palpable: [Left:Yes] [Right:Yes] Electronic Signature(s) Signed: 04/14/2022 4:34:49 PM By: Deon Pilling RN, BSN Entered By: Deon Pilling on 04/14/2022 08:11:52 -------------------------------------------------------------------------------- Multi Wound Chart Details Patient Name: Date of Service: [name], RO NN P. 04/14/2022 8:15 A M Medical Record Number: [id number] Patient Account Number: [id number] Date of Birth/Sex: Treating RN: 1946-02-15 (76 y.o. [name] Primary Care Yareliz Thorstenson: [name] Other Clinician: Referring Ayoub Arey: Treating Mykaila Blunck/Extender: [name] Weeks in Treatment: 22 Vital Signs Height(in): 68 Pulse(bpm): 27 Weight(lbs): 140 Blood Pressure(mmHg): 118/70 Body Mass Index(BMI): 21.3 Temperature(F): 98.1 Respiratory Rate(breaths/min): 20 Photos: [2:Left, Anterior Lower Leg] [3:Right, Medial Lower Leg] [4:Right, Anterior Lower Leg] Wound Location: [2:Trauma] [3:Trauma] [4:Trauma] Wounding Event: [2:Abrasion] [3:Abrasion] [4:Abrasion] Primary Etiology: [2:Asthma, Hypertension, Hepatitis A,  Asthma, Hypertension, Hepatitis A, Asthma, Hypertension, Hepatitis A,] Comorbid History: [2:Rheumatoid Arthritis 01/04/2022] [3:Rheumatoid Arthritis 01/24/2022] [4:Rheumatoid Arthritis 01/04/2022] Date Acquired: [2:10] [3:10] [4:10] Weeks of Treatment: [2:Open] [3:Open] [4:Open] Wound Status: [2:No] [3:No] [4:No] Wound Recurrence: [2:1.1x1x0.1] [3:1.9x1x0.1] [4:1.3x1.7x0.1] Measurements L x W x D (cm) [2:0.864] [3:1.492] [4:1.736] A (cm) : rea [2:0.086] [3:0.149] [4:0.174] Volume (cm) : [2:-103.80%] [3:87.90%] [4:-426.10%] % Reduction in A [2:rea: -104.80%] [3:88.00%] [4:-427.30%] % Reduction in Volume: [2:Full Thickness Without Exposed] [3:Full Thickness Without Exposed] [4:Full Thickness Without Exposed] Classification: [2:Support Structures Medium] [3:Support Structures Medium] [4:Support Structures Medium] Exudate A mount: [2:Serosanguineous] [3:Serosanguineous] [4:Serosanguineous] Exudate Type: [2:red, brown] [3:red, brown] [4:red, brown] Exudate Color: [2:Distinct, outline attached] [3:Distinct, outline attached] [4:Distinct, outline attached] Wound Margin: [2:Large (67-100%)] [3:Large (67-100%)] [4:Large (67-100%)] Granulation A mount: [2:Pink] [3:Red, Pink, Friable] [4:Pink] Granulation Quality: [2:Small (1-33%)] [3:None Present (0%)] [4:Small (1-33%)] Necrotic A mount: [2:Fat Layer (Subcutaneous  Tissue): Yes Fat Layer (Subcutaneous Tissue): Yes Fat Layer (Subcutaneous Tissue): Yes] Exposed Structures: [2:Fascia: No Tendon: No Muscle: No Joint: No Bone: No Medium (34-66%)] [3:Fascia: No Tendon: No Muscle: No Joint: No Bone: No Medium (34-66%)] [4:Fascia: No Tendon: No Muscle: No Joint: No Bone: No Small (1-33%)] Epithelialization: [2:Debridement - Excisional] [3:Debridement - Excisional] [4:Debridement - Excisional] Debridement: Pre-procedure Verification/Time Out 08:30 [3:08:30] [4:08:30] Taken: [2:Lidocaine 4% Topical Solution] [3:Lidocaine 4% Topical Solution] [4:Lidocaine 4% Topical Solution] Pain Control: [2:Subcutaneous, Slough] [3:Subcutaneous, Slough] [4:Subcutaneous, Slough] Tissue Debrided: [2:Skin/Subcutaneous Tissue] [3:Skin/Subcutaneous Tissue] [4:Skin/Subcutaneous Tissue] Level: [2:1.1] [3:1.9] [4:2.21] Debridement A (sq cm): [2:rea Curette] [3:Curette] [4:Curette] Instrument: [2:Minimum] [3:Minimum] [4:Minimum] Bleeding: [2:Pressure] [3:Pressure] [4:Pressure] Hemostasis A chieved: [2:0] [3:0] [4:0] Procedural Pain: [2:0] [3:0] [4:0] Post Procedural Pain: [2:Procedure was tolerated well] [3:Procedure was tolerated well] [4:Procedure was tolerated well] Debridement Treatment Response: [2:1.1x1x0.1] [3:1.9x1x0.1] [4:1.3x1.7x0.1] Post Debridement Measurements L x W x D (cm) [2:0.086] [3:0.149] [4:0.174] Post Debridement Volume: (cm) [2:Debridement] [3:Debridement] [4:Debridement] Wound Number: 6 N/A N/A Photos: N/A N/A Right, Lateral Lower Leg N/A N/A Wound Location: Laceration N/A N/A Wounding Event: Skin Tear N/A N/A Primary Etiology: Asthma, Hypertension, Hepatitis A, N/A N/A Comorbid History: Rheumatoid Arthritis 03/25/2022 N/A N/A Date Acquired: 2 N/A N/A Weeks of Treatment: Open N/A N/A Wound Status: No N/A N/A Wound Recurrence: 0.3x0.4x0.1 N/A N/A Measurements L x W x D (cm) 0.094 N/A N/A A (cm) : rea 0.009 N/A N/A Volume (cm) : 55.70% N/A  N/A % Reduction in Area: 57.10% N/A N/A % Reduction in Volume: Full Thickness Without Exposed N/A N/A Classification: Support Structures Medium N/A N/A Exudate Amount: Serosanguineous N/A N/A Exudate Type: red, brown N/A N/A Exudate Color: Distinct, outline attached N/A N/A Wound Margin: Large (67-100%) N/A N/A Granulation Amount: Red N/A N/A Granulation Quality: Small (1-33%) N/A N/A Necrotic Amount: Fat Layer (Subcutaneous Tissue): Yes N/A N/A Exposed Structures: Fascia: No Tendon: No Muscle: No Joint: No Bone: No Medium (34-66%) N/A N/A Epithelialization: Debridement - Excisional N/A N/A Debridement: Pre-procedure Verification/Time Out 08:30 N/A N/A Taken: Lidocaine 4% Topical Solution N/A N/A Pain Control: Subcutaneous, Slough N/A N/A Tissue Debrided: Skin/Subcutaneous Tissue N/A N/A Level: 0.12 N/A N/A Debridement A (sq cm): rea Curette N/A N/A Instrument: Minimum N/A N/A Bleeding: Pressure N/A N/A Hemostasis A chieved: 0 N/A N/A Procedural Pain: 0 N/A N/A Post Procedural Pain: Procedure was tolerated well N/A N/A Debridement Treatment Response: 0.3x0.4x0.1 N/A N/A Post Debridement Measurements L x W x D (cm) 0.009 N/A N/A Post Debridement Volume: (cm) Debridement N/A N/A Procedures Performed: Treatment Notes Wound #2 (Lower Leg) Wound Laterality: Left, Anterior Cleanser Soap  and Water Discharge Instruction: May shower and wash wound with dial antibacterial soap and water prior to dressing change. Peri-Wound Care Sween Lotion (Moisturizing lotion) Discharge Instruction: Apply moisturizing lotion as directed Topical topical compounding antibiotics Discharge Instruction: apply directly to wound bed. Primary Dressing PolyMem Silver Non-Adhesive Dressing, 4.25x4.25 in Discharge Instruction: Apply to wound bed as instructed Secondary Dressing ABD Pad, 8x10 Discharge Instruction: Apply over primary dressing as directed. Secured  With Compression Wrap ThreePress (3 layer compression wrap) Discharge Instruction: Apply three layer compression as directed. Compression Stockings Add-Ons Wound #3 (Lower Leg) Wound Laterality: Right, Medial Cleanser Soap and Water Discharge Instruction: May shower and wash wound with dial antibacterial soap and water prior to dressing change. Peri-Wound Care Sween Lotion (Moisturizing lotion) Discharge Instruction: Apply moisturizing lotion as directed Topical topical compounding antibiotics Discharge Instruction: apply directly to wound bed. Primary Dressing PolyMem Silver Non-Adhesive Dressing, 4.25x4.25 in Discharge Instruction: Apply to wound bed as instructed Secondary Dressing ABD Pad, 8x10 Discharge Instruction: Apply over primary dressing as directed. Secured With Compression Wrap ThreePress (3 layer compression wrap) Discharge Instruction: Apply three layer compression as directed. Compression Stockings Add-Ons Wound #4 (Lower Leg) Wound Laterality: Right, Anterior Cleanser Soap and Water Discharge Instruction: May shower and wash wound with dial antibacterial soap and water prior to dressing change. Peri-Wound Care Sween Lotion (Moisturizing lotion) Discharge Instruction: Apply moisturizing lotion as directed Topical topical compounding antibiotics Discharge Instruction: apply directly to wound bed. Primary Dressing PolyMem Silver Non-Adhesive Dressing, 4.25x4.25 in Discharge Instruction: Apply to wound bed as instructed Secondary Dressing ABD Pad, 8x10 Discharge Instruction: Apply over primary dressing as directed. Secured With Compression Wrap ThreePress (3 layer compression wrap) Discharge Instruction: Apply three layer compression as directed. Compression Stockings Add-Ons Wound #6 (Lower Leg) Wound Laterality: Right, Lateral Cleanser Soap and Water Discharge Instruction: May shower and wash wound with dial antibacterial soap and water prior to  dressing change. Peri-Wound Care Sween Lotion (Moisturizing lotion) Discharge Instruction: Apply moisturizing lotion as directed Topical topical compounding antibiotics Discharge Instruction: apply directly to wound bed. Primary Dressing PolyMem Silver Non-Adhesive Dressing, 4.25x4.25 in Discharge Instruction: Apply to wound bed as instructed Secondary Dressing ABD Pad, 8x10 Discharge Instruction: Apply over primary dressing as directed. Secured With Compression Wrap ThreePress (3 layer compression wrap) Discharge Instruction: Apply three layer compression as directed. Compression Stockings Add-Ons Electronic Signature(s) Signed: 04/14/2022 8:49:26 AM By: Kalman Shan DO Signed: 04/14/2022 4:34:49 PM By: Deon Pilling RN, BSN Entered By: Kalman Shan on 04/14/2022 08:45:58 -------------------------------------------------------------------------------- Multi-Disciplinary Care Plan Details Patient Name: Date of Service: [name], [place] NN P. 04/14/2022 8:15 A M Medical Record Number: [id number] Patient Account Number: [id number] Date of Birth/Sex: Treating RN: 15-Dec-1945 (76 y.o. [name] Primary Care Isaish Alemu: [name] Other Clinician: Referring Sequoia Mincey: Treating Batool Majid/Extender: [name] Weeks in Treatment: 22 Active Inactive Necrotic Tissue Nursing Diagnoses: Impaired tissue integrity related to necrotic/devitalized tissue Knowledge deficit related to management of necrotic/devitalized tissue Goals: Necrotic/devitalized tissue will be minimized in the wound bed Date Initiated: 02/03/2022 Target Resolution Date: 04/29/2022 Goal Status: Active Patient/caregiver will verbalize understanding of reason and process for debridement of necrotic tissue Date Initiated: 02/03/2022 Date Inactivated: 03/24/2022 Target Resolution Date: 04/01/2022 Goal Status: Met Interventions: Assess patient pain level pre-, during and post procedure and prior  to discharge Provide education on necrotic tissue and debridement process Treatment Activities: Apply topical anesthetic as ordered : 02/03/2022 Excisional debridement : 02/03/2022 Notes: Electronic Signature(s) Signed: 04/14/2022 4:34:49 PM By: Deon Pilling RN,  BSN Entered By: Deon Pilling on 04/14/2022 08:35:33 -------------------------------------------------------------------------------- Pain Assessment Details Patient Name: Date of Service: [name], [name] 04/14/2022 8:15 A M Medical Record Number: [id number] Patient Account Number: [id number] Date of Birth/Sex: Treating RN: 02/14/1946 (76 y.o. [name] Primary Care Cobi Delph: [name] Other Clinician: Referring Khole Branch: Treating Zarah Carbon/Extender: [name] Weeks in Treatment: 22 Active Problems Location of Pain Severity and Description of Pain Patient Has Paino No Site Locations Rate the pain. Current Pain Level: 0 Pain Management and Medication Current Pain Management: Medication: No Cold Application: No Rest: No Massage: No Activity: No T.E.N.S.: No Heat Application: No Leg drop or elevation: No Is the Current Pain Management Adequate: Adequate How does your wound impact your activities of daily livingo Sleep: No Bathing: No Appetite: No Relationship With Others: No Bladder Continence: No Emotions: No Bowel Continence: No Work: No Toileting: No Drive: No Dressing: No Hobbies: No Engineer, maintenance) Signed: 04/14/2022 4:34:49 PM By: Deon Pilling RN, BSN Entered By: Deon Pilling on 04/14/2022 08:11:39 -------------------------------------------------------------------------------- Patient/Caregiver Education Details Patient Name: Date of Service: [name], RO NN P. 7/13/2023andnbsp8:15 [place] Record Number: [id number] Patient Account Number: [id number] Date of Birth/Gender: Treating RN: October 18, 1945 (76 y.o. [name] Primary Care Physician: [name] Other Clinician: Referring Physician: Treating Physician/Extender: [name] Weeks in Treatment: 22 Education Assessment Education Provided To: Patient Education Topics Provided Wound Debridement: Handouts: Wound Debridement Methods: Explain/Verbal Responses: State content correctly Electronic Signature(s) Signed: 04/14/2022 4:34:49 PM By: Deon Pilling RN, BSN Signed: 04/14/2022 4:34:49 PM By: Deon Pilling RN, BSN Entered By: Deon Pilling on 04/14/2022 08:35:49 -------------------------------------------------------------------------------- Wound Assessment Details Patient Name: Date of Service: [name], RO NN P. 04/14/2022 8:15 A M Medical Record Number: [id number] Patient Account Number: [id number] Date of Birth/Sex: Treating RN: Jul 05, 1946 (76 y.o. [name] Primary Care Dakiya Puopolo: [name] Other Clinician: Referring Arden Axon: Treating Ginnifer Creelman/Extender: [name] Weeks in Treatment: 22 Wound Status Wound Number: 2 Primary Etiology: Abrasion Wound Location: Left, Anterior Lower Leg Wound Status: Open Wounding Event: Trauma Comorbid History: Asthma, Hypertension, Hepatitis A, Rheumatoid Arthritis Date Acquired: 01/04/2022 Weeks Of Treatment: 10 Clustered Wound: No Photos Wound Measurements Length: (cm) 1.1 Width: (cm) 1 Depth: (cm) 0.1 Area: (cm) 0.864 Volume: (cm) 0.086 % Reduction in Area: -103.8% % Reduction in Volume: -104.8% Epithelialization: Medium (34-66%) Tunneling: No Undermining: No Wound Description Classification: Full Thickness Without Exposed Support Structures Wound Margin: Distinct, outline attached Exudate Amount: Medium Exudate Type: Serosanguineous Exudate Color: red, brown Foul Odor After Cleansing: No Slough/Fibrino Yes Wound Bed Granulation Amount: Large (67-100%) Exposed Structure Granulation Quality: Pink Fascia Exposed: No Necrotic Amount: Small (1-33%) Fat Layer  (Subcutaneous Tissue) Exposed: Yes Necrotic Quality: Adherent Slough Tendon Exposed: No Muscle Exposed: No Joint Exposed: No Bone Exposed: No Treatment Notes Wound #2 (Lower Leg) Wound Laterality: Left, Anterior Cleanser Soap and Water Discharge Instruction: May shower and wash wound with dial antibacterial soap and water prior to dressing change. Peri-Wound Care Sween Lotion (Moisturizing lotion) Discharge Instruction: Apply moisturizing lotion as directed Topical topical compounding antibiotics Discharge Instruction: apply directly to wound bed. Primary Dressing PolyMem Silver Non-Adhesive Dressing, 4.25x4.25 in Discharge Instruction: Apply to wound bed as instructed Secondary Dressing ABD Pad, 8x10 Discharge Instruction: Apply over primary dressing as directed. Secured With Compression Wrap ThreePress (3 layer compression wrap) Discharge Instruction: Apply three layer compression as directed. Compression Stockings Add-Ons Electronic Signature(s) Signed: 04/14/2022 4:34:49  PM By: Deon Pilling RN, BSN Entered By: Deon Pilling on 04/14/2022 08:14:40 -------------------------------------------------------------------------------- Wound Assessment Details Patient Name: Date of Service: [name], RO NN P. 04/14/2022 8:15 A M Medical Record Number: [id number] Patient Account Number: [id number] Date of Birth/Sex: Treating RN: Jan 22, 1946 (76 y.o. [name] Primary Care Kenzie Flakes: [name] Other Clinician: Referring Sumayah Bearse: Treating Amisha Pospisil/Extender: [name] Weeks in Treatment: 22 Wound Status Wound Number: 3 Primary Etiology: Abrasion Wound Location: Right, Medial Lower Leg Wound Status: Open Wounding Event: Trauma Comorbid History: Asthma, Hypertension, Hepatitis A, Rheumatoid Arthritis Date Acquired: 01/24/2022 Weeks Of Treatment: 10 Clustered Wound: No Photos Wound Measurements Length: (cm) 1.9 Width: (cm) 1 Depth: (cm)  0.1 Area: (cm) 1.492 Volume: (cm) 0.149 Wound Description Classification: Full Thickness Without Exposed Support Structu Wound Margin: Distinct, outline attached Exudate Amount: Medium Exudate Type: Serosanguineous Exudate Color: red, brown res Foul Odor After Cleansing: No Slough/Fibrino No % Reduction in Area: 87.9% % Reduction in Volume: 88% Epithelialization: Medium (34-66%) Tunneling: No Undermining: No Wound Bed Granulation Amount: Large (67-100%) Exposed Structure Granulation Quality: Red, Pink, Friable Fascia Exposed: No Necrotic Amount: None Present (0%) Fat Layer (Subcutaneous Tissue) Exposed: Yes Tendon Exposed: No Muscle Exposed: No Joint Exposed: No Bone Exposed: No Treatment Notes Wound #3 (Lower Leg) Wound Laterality: Right, Medial Cleanser Soap and Water Discharge Instruction: May shower and wash wound with dial antibacterial soap and water prior to dressing change. Peri-Wound Care Sween Lotion (Moisturizing lotion) Discharge Instruction: Apply moisturizing lotion as directed Topical topical compounding antibiotics Discharge Instruction: apply directly to wound bed. Primary Dressing PolyMem Silver Non-Adhesive Dressing, 4.25x4.25 in Discharge Instruction: Apply to wound bed as instructed Secondary Dressing ABD Pad, 8x10 Discharge Instruction: Apply over primary dressing as directed. Secured With Compression Wrap ThreePress (3 layer compression wrap) Discharge Instruction: Apply three layer compression as directed. Compression Stockings Add-Ons Electronic Signature(s) Signed: 04/14/2022 4:34:49 PM By: Deon Pilling RN, BSN Entered By: Deon Pilling on 04/14/2022 08:15:00 -------------------------------------------------------------------------------- Wound Assessment Details Patient Name: Date of Service: [name], RO NN P. 04/14/2022 8:15 A M Medical Record Number: [id number] Patient Account Number: [id number] Date of Birth/Sex: Treating  RN: 10-08-1945 (76 y.o. [name] Primary Care Cregg Jutte: [name] Other Clinician: Referring Aime Carreras: Treating Anuar Walgren/Extender: [name] Weeks in Treatment: 22 Wound Status Wound Number: 4 Primary Etiology: Abrasion Wound Location: Right, Anterior Lower Leg Wound Status: Open Wounding Event: Trauma Comorbid History: Asthma, Hypertension, Hepatitis A, Rheumatoid Arthritis Date Acquired: 01/04/2022 Weeks Of Treatment: 10 Clustered Wound: No Photos Wound Measurements Length: (cm) 1.3 Width: (cm) 1.7 Depth: (cm) 0.1 Area: (cm) 1.736 Volume: (cm) 0.174 % Reduction in Area: -426.1% % Reduction in Volume: -427.3% Epithelialization: Small (1-33%) Tunneling: No Undermining: No Wound Description Classification: Full Thickness Without Exposed Support Structures Wound Margin: Distinct, outline attached Exudate Amount: Medium Exudate Type: Serosanguineous Exudate Color: red, brown Foul Odor After Cleansing: No Slough/Fibrino Yes Wound Bed Granulation Amount: Large (67-100%) Exposed Structure Granulation Quality: Pink Fascia Exposed: No Necrotic Amount: Small (1-33%) Fat Layer (Subcutaneous Tissue) Exposed: Yes Necrotic Quality: Adherent Slough Tendon Exposed: No Muscle Exposed: No Joint Exposed: No Bone Exposed: No Treatment Notes Wound #4 (Lower Leg) Wound Laterality: Right, Anterior Cleanser Soap and Water Discharge Instruction: May shower and wash wound with dial antibacterial soap and water prior to dressing change. Peri-Wound Care Sween Lotion (Moisturizing lotion) Discharge Instruction: Apply moisturizing lotion as directed Topical topical compounding antibiotics Discharge Instruction: apply directly to wound bed. Primary Dressing  PolyMem Silver Non-Adhesive Dressing, 4.25x4.25 in Discharge Instruction: Apply to wound bed as instructed Secondary Dressing ABD Pad, 8x10 Discharge Instruction: Apply over primary dressing as  directed. Secured With Compression Wrap ThreePress (3 layer compression wrap) Discharge Instruction: Apply three layer compression as directed. Compression Stockings Add-Ons Electronic Signature(s) Signed: 04/14/2022 4:34:49 PM By: Deon Pilling RN, BSN Entered By: Deon Pilling on 04/14/2022 08:15:15 -------------------------------------------------------------------------------- Wound Assessment Details Patient Name: Date of Service: [name], RO NN P. 04/14/2022 8:15 A M Medical Record Number: [id number] Patient Account Number: [id number] Date of Birth/Sex: Treating RN: May 19, 1946 (76 y.o. [name], [name] Primary Care Arul Farabee: [name] Other Clinician: Referring Gyneth Hubka: Treating Chalsea Darko/Extender: [name] Weeks in Treatment: 22 Wound Status Wound Number: 6 Primary Etiology: Skin Tear Wound Location: Right, Lateral Lower Leg Wound Status: Open Wounding Event: Laceration Comorbid History: Asthma, Hypertension, Hepatitis A, Rheumatoid Arthritis Date Acquired: 03/25/2022 Weeks Of Treatment: 2 Clustered Wound: No Photos Wound Measurements Length: (cm) 0.3 Width: (cm) 0.4 Depth: (cm) 0.1 Area: (cm) 0.094 Volume: (cm) 0.009 % Reduction in Area: 55.7% % Reduction in Volume: 57.1% Epithelialization: Medium (34-66%) Tunneling: No Undermining: No Wound Description Classification: Full Thickness Without Exposed Support Structures Wound Margin: Distinct, outline attached Exudate Amount: Medium Exudate Type: Serosanguineous Exudate Color: red, brown Foul Odor After Cleansing: No Slough/Fibrino Yes Wound Bed Granulation Amount: Large (67-100%) Exposed Structure Granulation Quality: Red Fascia Exposed: No Necrotic Amount: Small (1-33%) Fat Layer (Subcutaneous Tissue) Exposed: Yes Necrotic Quality: Adherent Slough Tendon Exposed: No Muscle Exposed: No Joint Exposed: No Bone Exposed: No Treatment Notes Wound #6 (Lower Leg) Wound  Laterality: Right, Lateral Cleanser Soap and Water Discharge Instruction: May shower and wash wound with dial antibacterial soap and water prior to dressing change. Peri-Wound Care Sween Lotion (Moisturizing lotion) Discharge Instruction: Apply moisturizing lotion as directed Topical topical compounding antibiotics Discharge Instruction: apply directly to wound bed. Primary Dressing PolyMem Silver Non-Adhesive Dressing, 4.25x4.25 in Discharge Instruction: Apply to wound bed as instructed Secondary Dressing ABD Pad, 8x10 Discharge Instruction: Apply over primary dressing as directed. Secured With Compression Wrap ThreePress (3 layer compression wrap) Discharge Instruction: Apply three layer compression as directed. Compression Stockings Add-Ons Electronic Signature(s) Signed: 04/14/2022 4:34:49 PM By: Deon Pilling RN, BSN Entered By: Deon Pilling on 04/14/2022 08:15:30 -------------------------------------------------------------------------------- Vitals Details Patient Name: Date of Service: [name], RO NN P. 04/14/2022 8:15 A M Medical Record Number: [id number] Patient Account Number: [id number] Date of Birth/Sex: Treating RN: [birth year] (76 y.o. [name] Primary Care Olando Willems: [name] Other Clinician: Referring Kimarion Chery: Treating Kervens Roper/Extender: [name] Weeks in Treatment: 22 Vital Signs Time Taken: 07:58 Temperature (F): 98.1 Height (in): 68 Pulse (bpm): 65 Weight (lbs): 140 Respiratory Rate (breaths/min): 20 Body Mass Index (BMI): 21.3 Blood Pressure (mmHg): 118/70 Reference Range: 80 - 120 mg / dl Electronic Signature(s) Signed: 04/14/2022 4:34:49 PM By: Deon Pilling RN, BSN Entered By: Deon Pilling on 04/14/2022 08:11:32
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
## 2022-04-14 NOTE — Progress Notes (Signed)
Brian Leon ([id number]) Visit Report for 04/14/2022 Chief Complaint Document Details Patient Name: Date of Service: Brian Leon, [name] 04/14/2022 8:15 A M Medical Record Number: [id number] Patient Account Number: [id number] Date of Birth/Sex: Treating RN: Feb 04, 1946 (76 y.o. Brian Leon Primary Care Provider: [name] Other Clinician: Referring Provider: Treating Provider/Extender: [name] Weeks in Treatment: 22 Information Obtained from: Patient Chief Complaint 11/11/2021; Left lower extremity wound due to trauma 02/03/2022; bilateral lower extremity wounds due to trauma Electronic Signature(s) Signed: 04/14/2022 8:49:26 AM By: Kalman Shan DO Entered By: Kalman Shan on 04/14/2022 08:46:03 -------------------------------------------------------------------------------- Debridement Details Patient Name: Date of Service: Brian Leon, RO NN P. 04/14/2022 8:15 A M Medical Record Number: [id number] Patient Account Number: [id number] Date of Birth/Sex: Treating RN: 07-23-1946 (76 y.o. Brian Leon, [name] Primary Care Provider: [name] Other Clinician: Referring Provider: Treating Provider/Extender: [name] Weeks in Treatment: 22 Debridement Performed for Assessment: Wound #2 Left,Anterior Lower Leg Performed By: Physician Kalman Shan, DO Debridement Type: Debridement Level of Consciousness (Pre-procedure): Awake and Alert Pre-procedure Verification/Time Out Yes - 08:30 Taken: Start Time: 08:31 Pain Control: Lidocaine 4% T opical Solution T Area Debrided (L x W): otal 1.1 (cm) x 1 (cm) = 1.1 (cm) Tissue and other material debrided: Viable, Non-Viable, Slough, Subcutaneous, Slough Level: Skin/Subcutaneous Tissue Debridement Description: Excisional Instrument: Curette Bleeding: Minimum Hemostasis Achieved: Pressure End Time: 08:38 Procedural Pain: 0 Post Procedural Pain: 0 Response to Treatment: Procedure was  tolerated well Level of Consciousness (Post- Awake and Alert procedure): Post Debridement Measurements of Total Wound Length: (cm) 1.1 Width: (cm) 1 Depth: (cm) 0.1 Volume: (cm) 0.086 Character of Wound/Ulcer Post Debridement: Improved Post Procedure Diagnosis Same as Pre-procedure Electronic Signature(s) Signed: 04/14/2022 8:49:26 AM By: Kalman Shan DO Signed: 04/14/2022 4:34:49 PM By: Deon Pilling RN, BSN Entered By: Deon Pilling on 04/14/2022 08:38:41 -------------------------------------------------------------------------------- Debridement Details Patient Name: Date of Service: Brian Leon, RO NN P. 04/14/2022 8:15 A M Medical Record Number: [id number] Patient Account Number: [id number] Date of Birth/Sex: Treating RN: 10-08-1945 (76 y.o. Brian Leon, [name] Primary Care Provider: [name] Other Clinician: Referring Provider: Treating Provider/Extender: [name] Weeks in Treatment: 22 Debridement Performed for Assessment: Wound #3 Right,Medial Lower Leg Performed By: Physician Kalman Shan, DO Debridement Type: Debridement Level of Consciousness (Pre-procedure): Awake and Alert Pre-procedure Verification/Time Out Yes - 08:30 Taken: Start Time: 08:31 Pain Control: Lidocaine 4% T opical Solution T Area Debrided (L x W): otal 1.9 (cm) x 1 (cm) = 1.9 (cm) Tissue and other material debrided: Viable, Non-Viable, Slough, Subcutaneous, Slough Level: Skin/Subcutaneous Tissue Debridement Description: Excisional Instrument: Curette Bleeding: Minimum Hemostasis Achieved: Pressure End Time: 08:38 Procedural Pain: 0 Post Procedural Pain: 0 Response to Treatment: Procedure was tolerated well Level of Consciousness (Post- Awake and Alert procedure): Post Debridement Measurements of Total Wound Length: (cm) 1.9 Width: (cm) 1 Depth: (cm) 0.1 Volume: (cm) 0.149 Character of Wound/Ulcer Post Debridement: Improved Post Procedure Diagnosis Same  as Pre-procedure Electronic Signature(s) Signed: 04/14/2022 8:49:26 AM By: Kalman Shan DO Signed: 04/14/2022 4:34:49 PM By: Deon Pilling RN, BSN Entered By: Deon Pilling on 04/14/2022 08:38:59 -------------------------------------------------------------------------------- Debridement Details Patient Name: Date of Service: Brian Leon, RO NN P. 04/14/2022 8:15 A M Medical Record Number: [id number] Patient Account Number: [id number] Date of Birth/Sex: Treating RN: 12/05/1945 (76 y.o. Brian Leon Primary Care Provider: Other Clinician: [name] Referring Provider: Treating Provider/Extender: [name]  Weeks in Treatment: 22 Debridement Performed for Assessment: Wound #4 Right,Anterior Lower Leg Performed By: Physician Kalman Shan, DO Debridement Type: Debridement Level of Consciousness (Pre-procedure): Awake and Alert Pre-procedure Verification/Time Out Yes - 08:30 Taken: Start Time: 08:31 Pain Control: Lidocaine 4% T opical Solution T Area Debrided (L x W): otal 1.3 (cm) x 1.7 (cm) = 2.21 (cm) Tissue and other material debrided: Viable, Non-Viable, Slough, Subcutaneous, Slough Level: Skin/Subcutaneous Tissue Debridement Description: Excisional Instrument: Curette Bleeding: Minimum Hemostasis Achieved: Pressure End Time: 08:38 Procedural Pain: 0 Post Procedural Pain: 0 Response to Treatment: Procedure was tolerated well Level of Consciousness (Post- Awake and Alert procedure): Post Debridement Measurements of Total Wound Length: (cm) 1.3 Width: (cm) 1.7 Depth: (cm) 0.1 Volume: (cm) 0.174 Character of Wound/Ulcer Post Debridement: Improved Post Procedure Diagnosis Same as Pre-procedure Electronic Signature(s) Signed: 04/14/2022 8:49:26 AM By: Kalman Shan DO Signed: 04/14/2022 4:34:49 PM By: Deon Pilling RN, BSN Entered By: Deon Pilling on 04/14/2022  08:39:17 -------------------------------------------------------------------------------- Debridement Details Patient Name: Date of Service: Brian Leon, RO NN P. 04/14/2022 8:15 A M Medical Record Number: [id number] Patient Account Number: [id number] Date of Birth/Sex: Treating RN: 1946/09/30 (76 y.o. Brian Leon, [name] Primary Care Provider: [name] Other Clinician: Referring Provider: Treating Provider/Extender: [name] Weeks in Treatment: 22 Debridement Performed for Assessment: Wound #6 Right,Lateral Lower Leg Performed By: Physician Kalman Shan, DO Debridement Type: Debridement Level of Consciousness (Pre-procedure): Awake and Alert Pre-procedure Verification/Time Out Yes - 08:30 Taken: Start Time: 08:31 Pain Control: Lidocaine 4% T opical Solution T Area Debrided (L x W): otal 0.3 (cm) x 0.4 (cm) = 0.12 (cm) Tissue and other material debrided: Viable, Non-Viable, Slough, Subcutaneous, Slough Level: Skin/Subcutaneous Tissue Debridement Description: Excisional Instrument: Curette Bleeding: Minimum Hemostasis Achieved: Pressure End Time: 08:38 Procedural Pain: 0 Post Procedural Pain: 0 Response to Treatment: Procedure was tolerated well Level of Consciousness (Post- Awake and Alert procedure): Post Debridement Measurements of Total Wound Length: (cm) 0.3 Width: (cm) 0.4 Depth: (cm) 0.1 Volume: (cm) 0.009 Character of Wound/Ulcer Post Debridement: Improved Post Procedure Diagnosis Same as Pre-procedure Electronic Signature(s) Signed: 04/14/2022 8:49:26 AM By: Kalman Shan DO Signed: 04/14/2022 4:34:49 PM By: Deon Pilling RN, BSN Entered By: Deon Pilling on 04/14/2022 08:39:36 -------------------------------------------------------------------------------- HPI Details Patient Name: Date of Service: Brian Leon, RO NN P. 04/14/2022 8:15 A M Medical Record Number: [id number] Patient Account Number: [id number] Date of Birth/Sex:  Treating RN: 24-Jun-1946 (76 y.o. Brian Leon Primary Care Provider: [name] Other Clinician: Referring Provider: Treating Provider/Extender: [name] Weeks in Treatment: 22 History of Present Illness HPI Description: Admission 11/11/2021 Mr. Isak Howk is a 76 year old male with a past medical history of polymyalgia rheumatica that presents to the clinic for a 72-monthhistory of nonhealing wound to the left lower extremity. He states that on Halloween he hit an object and created a wound that has not healed. He states he has seen dermatology and his primary care physician for this issue. On one occasion he had an UHaematologist He has also used Xeroform. It does not sound like he uses any kind of wound dressing consistently. He currently reports chronic pain to the wound site. He denies systemic signs of infection. He denies purulent drainage. 2/16; patient presents for follow-up. He has been using Hydrofera Blue and gentamicin ointment to the wound bed without issues. He reports improvement in wound healing. He currently denies signs of infection. 2/23; patient presents for follow-up. He continues to  use Hydrofera Blue and gentamicin ointment to the wound bed without issues. He denies signs of infection. He reports improvement in his chronic pain to the wound bed. 3/2; patient presents for follow-up. He has been using Hydrofera Blue and gentamicin to the wound bed without issues. He reports he is going to [place] for the next 3 weeks. He currently denies signs of infection. 3/24; patient presents for follow-up. He has been in [place] for the past 3 weeks for vacation. He enjoyed his trip. He reports no issues today. He has been using Hydrofera Blue and gentamicin ointment to the wound beds without issues. 4/7; patient presents for follow-up. He has been using Hydrofera Blue to the wound bed without issues. He reports that his wound is healed. 02/03/2022; patient was  discharged 1 month ago with a closed wound to the left lower extremity. Unfortunately over the past month he has developed 3 new wounds he states was caused by hitting his legs against different objects. He has been keeping the areas covered with DuoDERM. He reports mild tenderness to the right lower extremity wound bed with increased redness. He denies purulent drainage. He states he has his previous wound care supplies of Hydrofera Blue and gentamicin ointment. 5/11; patient presents for follow-up. He had a PCR culture done at last clinic visit that showed high levels of Staph aureus and viridans and low levels of coagulase-negative staph. I started him on Augmentin at last clinic visit but he reports continued pain and erythema to the wound sites. He has been using gentamicin ointment and Hydrofera Blue with dressing changes. He also presents with a 100.2 temp cough and diarrhea. He has not taken anything for his symptoms. 5/18; patient presents for follow-up. He received his Keystone antibiotics and started using this. Unfortunately he mixed it incorrectly and it turned into a hardened substance. He had to use his refill to start all over again. He has only been using this for 1 day. He states he is combining this correctly now. He completed his course of clindamycin. He denies signs of infection. 5/25; patient presents for follow-up. He has been using Keystone antibiotics with Sorbact. He reports more serous drainage from the left lower extremity. Other than that he has no issues or complaints. He denies signs of infection. 6/1; patient presents for follow-up. We switched to Iodoflex and Keystone antibiotic under compression therapy at last clinic visit. He tolerated this treatment well. He has no issues or complaints today. He denies signs of infection. Unfortunately he will be out of town for the next 3 weeks. 6/22; patient presents for follow-up. He was in [place] for the past 3 weeks. He has  been using compression stockings daily along with [facility] antibiotic and Hydrofera Blue. Unfortunately he developed a skin tear to his right arm after hitting a door. 6/29; patient presents for follow-up. We have been using Keystone antibiotics with PolyMem silver to the lower extremities under 3 layer compression. Apparently he has a skin tear to the lateral Aspect of the right leg that he states was there previously. I had not seen it before. His skin tear on his right arm is well-healing with the use of bacitracin. He has no issues or complaints today. 7/6; patient presents for follow-up. We have been using Keystone antibiotics with PolyMem silver to the lower extremities under 3 layer compression. And antibiotic ointment to the right lateral leg wound. Patient reports improvement in wound healing. 7/13; patient presents for follow-up. We have been using Keystone  antibiotics and PolyMem silver to all the wound beds. Patient denies signs of infection. He tolerated the compression wraps well. He is currently being treated for C. difficile with vancomycin. Electronic Signature(s) Signed: 04/14/2022 8:49:26 AM By: Kalman Shan DO Entered By: Kalman Shan on 04/14/2022 08:46:36 -------------------------------------------------------------------------------- Physical Exam Details Patient Name: Date of Service: Brian Leon, RO NN P. 04/14/2022 8:15 A M Medical Record Number: [id number] Patient Account Number: [id number] Date of Birth/Sex: Treating RN: [birth year] (76 y.o. Brian Leon Primary Care Provider: [name] Other Clinician: Referring Provider: Treating Provider/Extender: [name] Weeks in Treatment: 22 Constitutional respirations regular, non-labored and within target range for patient.. Cardiovascular 2+ dorsalis pedis/posterior tibialis pulses. Psychiatric pleasant and cooperative. Notes Multiple open wounds to his lower extremities bilaterally  with granulation tissue, nonviable tissue and tightly adhered fibrinous tissue Electronic Signature(s) Signed: 04/14/2022 8:49:26 AM By: Kalman Shan DO Entered By: Kalman Shan on 04/14/2022 08:47:10 -------------------------------------------------------------------------------- Physician Orders Details Patient Name: Date of Service: Brian Leon, RO NN P. 04/14/2022 8:15 A M Medical Record Number: [id number] Patient Account Number: [id number] Date of Birth/Sex: Treating RN: 09/20/1946 (76 y.o. 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 - Dr. Heber Ferriday and [place], Room 8 04/21/2022 0815 Thursday Return A ppointment in 2 weeks. - Dr. Heber Dunn and [place], Room 8 Thursday 0815 04/28/2022 Return A Bathing/ Shower/ Hygiene May shower with protection but do not get wound dressing(s) wet. Edema Control - Lymphedema / SCD / Other Elevate legs to the level of the heart or above for 30 minutes daily and/or when sitting, a frequency of: - 3-4 times a day throughout the day. Avoid standing for long periods of time. Exercise regularly Moisturize legs daily. - every night before bed. do not get directly on wounds Wound Treatment Wound #2 - Lower Leg Wound Laterality: Left, Anterior Cleanser: Soap and Water 1 x Per Week/30 Days Discharge Instructions: May shower and wash  wound with dial antibacterial soap and water prior to dressing change. Peri-Wound Care: Sween Lotion (Moisturizing lotion) 1 x Per Week/30 Days Discharge Instructions: Apply moisturizing lotion as directed Topical: topical compounding antibiotics 1 x Per Week/30 Days Discharge Instructions: apply directly to wound bed. Prim Dressing: PolyMem Silver Non-Adhesive Dressing, 4.25x4.25 in 1 x Per Week/30 Days ary Discharge Instructions: Apply to wound bed as instructed Secondary Dressing: ABD Pad, 8x10 1 x Per Week/30 Days Discharge Instructions: Apply over primary dressing as directed. Compression Wrap: ThreePress (3 layer compression wrap) 1 x Per Week/30 Days Discharge Instructions: Apply three layer compression as directed. Wound #3 - Lower Leg Wound Laterality: Right, Medial Cleanser: Soap and Water 1 x Per Week/30 Days Discharge Instructions: May shower and wash wound with dial antibacterial soap and water prior to dressing change. Peri-Wound Care: Sween Lotion (Moisturizing lotion) 1 x Per Week/30 Days Discharge Instructions: Apply moisturizing lotion as directed Topical: topical compounding antibiotics 1 x Per Week/30 Days Discharge Instructions: apply directly to wound bed. Prim Dressing: PolyMem Silver Non-Adhesive Dressing,  4.25x4.25 in 1 x Per Week/30 Days ary Discharge Instructions: Apply to wound bed as instructed Secondary Dressing: ABD Pad, 8x10 1 x Per Week/30 Days Discharge Instructions: Apply over primary dressing as directed. Compression Wrap: ThreePress (3 layer compression wrap) 1 x Per Week/30 Days Discharge Instructions: Apply three layer compression as directed. Wound #4 - Lower Leg Wound Laterality: Right, Anterior Cleanser: Soap and Water 1 x Per Week/30 Days Discharge Instructions: May shower and wash wound with dial antibacterial soap and water prior to dressing change. Peri-Wound Care: Sween Lotion (Moisturizing lotion) 1 x Per Week/30 Days Discharge  Instructions: Apply moisturizing lotion as directed Topical: topical compounding antibiotics 1 x Per Week/30 Days Discharge Instructions: apply directly to wound bed. Prim Dressing: PolyMem Silver Non-Adhesive Dressing, 4.25x4.25 in 1 x Per Week/30 Days ary Discharge Instructions: Apply to wound bed as instructed Secondary Dressing: ABD Pad, 8x10 1 x Per Week/30 Days Discharge Instructions: Apply over primary dressing as directed. Compression Wrap: ThreePress (3 layer compression wrap) 1 x Per Week/30 Days Discharge Instructions: Apply three layer compression as directed. Wound #6 - Lower Leg Wound Laterality: Right, Lateral Cleanser: Soap and Water 1 x Per Week/30 Days Discharge Instructions: May shower and wash wound with dial antibacterial soap and water prior to dressing change. Peri-Wound Care: Sween Lotion (Moisturizing lotion) 1 x Per Week/30 Days Discharge Instructions: Apply moisturizing lotion as directed Topical: topical compounding antibiotics 1 x Per Week/30 Days Discharge Instructions: apply directly to wound bed. Prim Dressing: PolyMem Silver Non-Adhesive Dressing, 4.25x4.25 in 1 x Per Week/30 Days ary Discharge Instructions: Apply to wound bed as instructed Secondary Dressing: ABD Pad, 8x10 1 x Per Week/30 Days Discharge Instructions: Apply over primary dressing as directed. Compression Wrap: ThreePress (3 layer compression wrap) 1 x Per Week/30 Days Discharge Instructions: Apply three layer compression as directed. Electronic Signature(s) Signed: 04/14/2022 8:49:26 AM By: Kalman Shan DO Entered By: Kalman Shan on 04/14/2022 08:47:19 -------------------------------------------------------------------------------- Problem List Details Patient Name: Date of Service: Brian Leon, RO NN P. 04/14/2022 8:15 A M Medical Record Number: [id number] Patient Account Number: [id number] Date of Birth/Sex: Treating RN: Feb 17, 1946 (76 y.o. Brian Leon, [name] Primary Care  Provider: [name] Other Clinician: Referring Provider: Treating Provider/Extender: [name] Weeks in Treatment: 22 Active Problems ICD-10 Encounter Code Description Active Date MDM Diagnosis L97.822 Non-pressure chronic ulcer of other part of left lower leg with fat layer exposed2/06/2022 No Yes L97.912 Non-pressure chronic ulcer of unspecified part of right lower leg with fat layer 02/03/2022 No Yes exposed S41.101A Unspecified open wound of right upper arm, initial encounter 03/24/2022 No Yes T14.90XA Injury, unspecified, initial encounter 11/11/2021 No Yes M35.3 Polymyalgia rheumatica 11/11/2021 No Yes I87.2 Venous insufficiency (chronic) (peripheral) 02/24/2022 No Yes Inactive Problems Resolved Problems Electronic Signature(s) Signed: 04/14/2022 8:49:26 AM By: Kalman Shan DO Entered By: Kalman Shan on 04/14/2022 08:45:52 -------------------------------------------------------------------------------- Progress Note Details Patient Name: Date of Service: Brian Leon, RO NN P. 04/14/2022 8:15 A M Medical Record Number: [id number] Patient Account Number: [id number] Date of Birth/Sex: Treating RN: 1946/04/19 (76 y.o. Brian Leon Primary Care Provider: [name] Other Clinician: Referring Provider: Treating Provider/Extender: [name] Weeks in Treatment: 22 Subjective Chief Complaint Information obtained from Patient 11/11/2021; Left lower extremity wound due to trauma 02/03/2022; bilateral lower extremity wounds due to trauma History of Present Illness (HPI) Admission 11/11/2021 Mr. Franciszek Dilone is a 75 year old male with a past medical history of polymyalgia rheumatica that presents to the clinic  for a 59-monthhistory of nonhealing wound to the left lower extremity. He states that on Halloween he hit an object and created a wound that has not healed. He states he has seen dermatology and his primary care physician for this  issue. On one occasion he had an UHaematologist He has also used Xeroform. It does not sound like he uses any kind of wound dressing consistently. He currently reports chronic pain to the wound site. He denies systemic signs of infection. He denies purulent drainage. 2/16; patient presents for follow-up. He has been using Hydrofera Blue and gentamicin ointment to the wound bed without issues. He reports improvement in wound healing. He currently denies signs of infection. 2/23; patient presents for follow-up. He continues to use Hydrofera Blue and gentamicin ointment to the wound bed without issues. He denies signs of infection. He reports improvement in his chronic pain to the wound bed. 3/2; patient presents for follow-up. He has been using Hydrofera Blue and gentamicin to the wound bed without issues. [place] He currently denies signs of infection. 3/24; patient presents for follow-up. [place] He enjoyed his trip. He reports no issues today. He has been using Hydrofera Blue and gentamicin ointment to the wound beds without issues. 4/7; patient presents for follow-up. He has been using Hydrofera Blue to the wound bed without issues. He reports that his wound is healed. 02/03/2022; patient was discharged 1 month ago with a closed wound to the left lower extremity. Unfortunately over the past month he has developed 3 new wounds he states was caused by hitting his legs against different objects. He has been keeping the areas covered with DuoDERM. He reports mild tenderness to the right lower extremity wound bed with increased redness. He denies purulent drainage. He states he has his previous wound care supplies of Hydrofera Blue and gentamicin ointment. 5/11; patient presents for follow-up. He had a PCR culture done at last clinic visit that showed high levels of Staph aureus and viridans and low levels  of coagulase-negative staph. I started him on Augmentin at last clinic visit but he reports continued pain and erythema to the wound sites. He has been using gentamicin ointment and Hydrofera Blue with dressing changes. He also presents with a 100.2 temp cough and diarrhea. He has not taken anything for his symptoms. 5/18; patient presents for follow-up. He received his Keystone antibiotics and started using this. Unfortunately he mixed it incorrectly and it turned into a hardened substance. He had to use his refill to start all over again. He has only been using this for 1 day. He states he is combining this correctly now. He completed his course of clindamycin. He denies signs of infection. 5/25; patient presents for follow-up. He has been using Keystone antibiotics with Sorbact. He reports more serous drainage from the left lower extremity. Other than that he has no issues or complaints. He denies signs of infection. 6/1; patient presents for follow-up. We switched to Iodoflex and Keystone antibiotic under compression therapy at last clinic visit. He tolerated this treatment well. He has no issues or complaints today. He denies signs of infection. Unfortunately he will be out of town for the next 3 weeks. 6/22; [place]. [place] [facility]a [facility]. Unfortunately he  developed a skin tear to his right arm after hitting a door. 6/29; patient presents for follow-up. We have been using Keystone antibiotics with PolyMem silver to the lower extremities under 3 layer compression. Apparently he has a skin tear to the lateral Aspect of the right leg that he states was there previously. I had not seen it before. His skin tear on his right arm is well-healing with the use of bacitracin. He has no issues or complaints today. 7/6; patient presents for follow-up. We have been using  Keystone antibiotics with PolyMem silver to the lower extremities under 3 layer compression. And antibiotic ointment to the right lateral leg wound. Patient reports improvement in wound healing. 7/13; patient presents for follow-up. We have been using Keystone antibiotics and PolyMem silver to all the wound beds. Patient denies signs of infection. He tolerated the compression wraps well. He is currently being treated for C. difficile with vancomycin. Patient History Information obtained from Chart. Family History Cancer - Mother, No family history of Diabetes, Heart Disease, Hypertension, Kidney Disease, Lung Disease, Seizures, Stroke, Thyroid Problems, Tuberculosis. Social History Former smoker - quit 40 years ago, Marital Status - Married, Alcohol Use - Never, Drug Use - Current History - marijuana, Caffeine Use - Rarely. Medical History Eyes Denies history of Cataracts, Glaucoma, Optic Neuritis Ear/Nose/Mouth/Throat Denies history of Chronic sinus problems/congestion, Middle ear problems Hematologic/Lymphatic Denies history of Anemia, Hemophilia, Human Immunodeficiency Virus, Lymphedema, Sickle Cell Disease Respiratory Patient has history of Asthma Denies history of Aspiration, Chronic Obstructive Pulmonary Disease (COPD), Pneumothorax, Sleep Apnea, Tuberculosis Cardiovascular Patient has history of Hypertension Denies history of Angina, Arrhythmia, Congestive Heart Failure, Coronary Artery Disease, Deep Vein Thrombosis, Hypotension, Myocardial Infarction, Peripheral Arterial Disease, Peripheral Venous Disease, Phlebitis, Vasculitis Gastrointestinal Patient has history of Hepatitis A Denies history of Cirrhosis , Colitis, Crohnoos, Hepatitis B, Hepatitis C Endocrine Denies history of Type I Diabetes, Type II Diabetes Genitourinary Denies history of End Stage Renal Disease Immunological Denies history of Lupus Erythematosus, Raynaudoos, Scleroderma Integumentary (Skin) Denies  history of History of Burn Musculoskeletal Patient has history of Rheumatoid Arthritis Denies history of Gout, Osteoarthritis, Osteomyelitis Psychiatric Denies history of Anorexia/bulimia, Confinement Anxiety Hospitalization/Surgery History - 5 years ago Renal Cell carcinoma. 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 Vitals Time Taken: 7:58 AM, Height: 68 in, Weight: 140 lbs, BMI: 21.3, Temperature: 98.1 F, Pulse: 65 bpm, Respiratory Rate: 20 breaths/min, Blood Pressure: 118/70 mmHg. Cardiovascular 2+ dorsalis pedis/posterior tibialis pulses. Psychiatric pleasant and cooperative. General Notes: Multiple open wounds to his lower extremities bilaterally with granulation tissue, nonviable tissue and tightly adhered fibrinous tissue Integumentary (Hair, Skin) Wound #2 status is Open. Original cause of wound was Trauma. The date acquired was: 01/04/2022. The wound has been in treatment 10 weeks. The wound is located on the Left,Anterior Lower Leg. The wound measures 1.1cm length x 1cm width x 0.1cm depth; 0.864cm^2 area and 0.086cm^3 volume. There is Fat Layer (Subcutaneous Tissue) exposed. There is no tunneling or undermining noted. There is a medium amount of serosanguineous drainage noted. The wound margin is distinct with the outline attached to the wound base. There is large (67-100%) pink granulation within the  wound bed. There is a small (1-33%) amount of  necrotic tissue within the wound bed including Adherent Slough. Wound #3 status is Open. Original cause of wound was Trauma. The date acquired was: 01/24/2022. The wound has been in treatment 10 weeks. The wound is located on the Right,Medial Lower Leg. The wound measures 1.9cm length x 1cm width x 0.1cm depth; 1.492cm^2 area and 0.149cm^3 volume. There is Fat Layer (Subcutaneous Tissue) exposed. There is no tunneling or undermining noted. There is a medium amount of serosanguineous drainage noted. The wound margin is distinct with the outline attached to the wound base. There is large (67-100%) red, pink, friable granulation within the wound bed. There is no necrotic tissue within the wound bed. Wound #4 status is Open. Original cause of wound was Trauma. The date acquired was: 01/04/2022. The wound has been in treatment 10 weeks. The wound is located on the Right,Anterior Lower Leg. The wound measures 1.3cm length x 1.7cm width x 0.1cm depth; 1.736cm^2 area and 0.174cm^3 volume. There is Fat Layer (Subcutaneous Tissue) exposed. There is no tunneling or undermining noted. There is a medium amount of serosanguineous drainage noted. The wound margin is distinct with the outline attached to the wound base. There is large (67-100%) pink granulation within the wound bed. There is a small (1-33%) amount of necrotic tissue within the wound bed including Adherent Slough. Wound #6 status is Open. Original cause of wound was Laceration. The date acquired was: 03/25/2022. The wound has been in treatment 2 weeks. The wound is located on the Right,Lateral Lower Leg. The wound measures 0.3cm length x 0.4cm width x 0.1cm depth; 0.094cm^2 area and 0.009cm^3 volume. There is Fat Layer (Subcutaneous Tissue) exposed. There is no tunneling or undermining noted. There is a medium amount of serosanguineous drainage noted. The wound margin is distinct with the outline attached to the wound base. There is large (67-100%) red  granulation within the wound bed. There is a small (1-33%) amount of necrotic tissue within the wound bed including Adherent Slough. 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 I debrided nonviable tissue. No signs of surrounding infection. I recommended continuing the course with [facility] antibiotic and PolyMem silver under compression therapy. Follow-up in 1 week. Procedures Wound #2 Pre-procedure diagnosis of Wound #2 is an Abrasion located on the Left,Anterior Lower Leg . There was a Excisional Skin/Subcutaneous Tissue Debridement with a total area of 1.1 sq cm performed by Kalman Shan, DO. With the following instrument(s): Curette to remove Viable and Non-Viable tissue/material. Material removed includes Subcutaneous Tissue and Slough and after achieving pain control using Lidocaine 4% T opical Solution. A time out was conducted at 08:30, prior to the start of the procedure. A Minimum amount of bleeding was controlled with Pressure. The procedure was tolerated well with a pain level of 0 throughout and a pain level of 0 following the procedure. Post Debridement Measurements: 1.1cm length x 1cm width x 0.1cm depth; 0.086cm^3 volume. Character of Wound/Ulcer Post Debridement is improved. Post procedure Diagnosis Wound #2: Same as Pre-Procedure Wound #3 Pre-procedure diagnosis of Wound #3 is an Abrasion located on the Right,Medial Lower Leg . There was a Excisional Skin/Subcutaneous  Tissue Debridement with a total area of 1.9 sq cm performed by Kalman Shan, DO. With the  following instrument(s): Curette to remove Viable and Non-Viable tissue/material. Material removed includes Subcutaneous Tissue and Slough and after achieving pain control using Lidocaine 4% T opical Solution. A time out was conducted at 08:30, prior to the start of the procedure. A Minimum amount of bleeding was controlled with Pressure. The procedure was tolerated well with a pain level of 0 throughout and a pain level of 0 following the procedure. Post Debridement Measurements: 1.9cm length x 1cm width x 0.1cm depth; 0.149cm^3 volume. Character of Wound/Ulcer Post Debridement is improved. Post procedure Diagnosis Wound #3: Same as Pre-Procedure Wound #4 Pre-procedure diagnosis of Wound #4 is an Abrasion located on the Right,Anterior Lower Leg . There was a Excisional Skin/Subcutaneous Tissue Debridement with a total area of 2.21 sq cm performed by Kalman Shan, DO. With the following instrument(s): Curette to remove Viable and Non-Viable tissue/material. Material removed includes Subcutaneous Tissue and Slough and after achieving pain control using Lidocaine 4% T opical Solution. A time out was conducted at 08:30, prior to the start of the procedure. A Minimum amount of bleeding was controlled with Pressure. The procedure was tolerated well with a pain level of 0 throughout and a pain level of 0 following the procedure. Post Debridement Measurements: 1.3cm length x 1.7cm width x 0.1cm depth; 0.174cm^3 volume. Character of Wound/Ulcer Post Debridement is improved. Post procedure Diagnosis Wound #4: Same as Pre-Procedure Wound #6 Pre-procedure diagnosis of Wound #6 is a Skin T located on the Right,Lateral Lower Leg . There was a Excisional Skin/Subcutaneous Tissue Debridement ear with a total area of 0.12 sq cm performed by Kalman Shan, DO. With the following instrument(s): Curette to remove Viable and Non-Viable tissue/material. Material removed includes Subcutaneous Tissue and Slough  and after achieving pain control using Lidocaine 4% T opical Solution. A time out was conducted at 08:30, prior to the start of the procedure. A Minimum amount of bleeding was controlled with Pressure. The procedure was tolerated well with a pain level of 0 throughout and a pain level of 0 following the procedure. Post Debridement Measurements: 0.3cm length x 0.4cm width x 0.1cm depth; 0.009cm^3 volume. Character of Wound/Ulcer Post Debridement is improved. Post procedure Diagnosis Wound #6: Same as Pre-Procedure Plan Follow-up Appointments: Return Appointment in 1 week. - Dr. Heber [place] [facility] [place], Room 8 04/21/2022 0815 Thursday Return Appointment in 2 weeks. - Dr. Heber [place] [facility] [place], Room 8 Thursday 0815 04/28/2022 Bathing/ Shower/ Hygiene: May shower with protection but do not get wound dressing(s) wet. Edema Control - Lymphedema / SCD / Other: Elevate legs to the level of the heart or above for 30 minutes daily and/or when sitting, a frequency of: - 3-4 times a day throughout the day. Avoid standing for long periods of time. Exercise regularly Moisturize legs daily. - every night before bed. do not get directly on wounds WOUND #2: - Lower Leg Wound Laterality: Left, Anterior Cleanser: Soap and Water 1 x Per Week/30 Days Discharge Instructions: May shower and wash wound with dial antibacterial soap and water prior to dressing change. Peri-Wound Care: Sween Lotion (Moisturizing lotion) 1 x Per Week/30 Days Discharge Instructions: Apply moisturizing lotion as directed Topical: topical compounding antibiotics 1 x Per Week/30 Days Discharge Instructions: apply directly to wound bed. Prim Dressing: PolyMem Silver Non-Adhesive Dressing, 4.25x4.25 in 1 x Per Week/30 Days ary Discharge Instructions: Apply to wound bed as instructed Secondary Dressing: ABD Pad, 8x10 1 x  Per Week/30 Days Discharge Instructions: Apply over primary dressing as directed. Com pression Wrap: ThreePress (3 layer  compression wrap) 1 x Per Week/30 Days Discharge Instructions: Apply three layer compression as directed. WOUND #3: - Lower Leg Wound Laterality: Right, Medial Cleanser: Soap and Water 1 x Per Week/30 Days Discharge Instructions: May shower and wash wound with dial antibacterial soap and water prior to dressing change. Peri-Wound Care: Sween Lotion (Moisturizing lotion) 1 x Per Week/30 Days Discharge Instructions: Apply moisturizing lotion as directed Topical: topical compounding antibiotics 1 x Per Week/30 Days Discharge Instructions: apply directly to wound bed. Prim Dressing: PolyMem Silver Non-Adhesive Dressing, 4.25x4.25 in 1 x Per Week/30 Days ary Discharge Instructions: Apply to wound bed as instructed Secondary Dressing: ABD Pad, 8x10 1 x Per Week/30 Days Discharge Instructions: Apply over primary dressing as directed. Com pression Wrap: ThreePress (3 layer compression wrap) 1 x Per Week/30 Days Discharge Instructions: Apply three layer compression as directed. WOUND #4: - Lower Leg Wound Laterality: Right, Anterior Cleanser: Soap and Water 1 x Per Week/30 Days Discharge Instructions: May shower and wash wound with dial antibacterial soap and water prior to dressing change. Peri-Wound Care: Sween Lotion (Moisturizing lotion) 1 x Per Week/30 Days Discharge Instructions: Apply moisturizing lotion as directed Topical: topical compounding antibiotics 1 x Per Week/30 Days Discharge Instructions: apply directly to wound bed. Prim Dressing: PolyMem Silver Non-Adhesive Dressing, 4.25x4.25 in 1 x Per Week/30 Days ary Discharge Instructions: Apply to wound bed as instructed Secondary Dressing: ABD Pad, 8x10 1 x Per Week/30 Days Discharge Instructions: Apply over primary dressing as directed. Com pression Wrap: ThreePress (3 layer compression wrap) 1 x Per Week/30 Days Discharge Instructions: Apply three layer compression as directed. WOUND #6: - Lower Leg Wound Laterality: Right,  Lateral Cleanser: Soap and Water 1 x Per Week/30 Days Discharge Instructions: May shower and wash wound with dial antibacterial soap and water prior to dressing change. Peri-Wound Care: Sween Lotion (Moisturizing lotion) 1 x Per Week/30 Days Discharge Instructions: Apply moisturizing lotion as directed Topical: topical compounding antibiotics 1 x Per Week/30 Days Discharge Instructions: apply directly to wound bed. Prim Dressing: PolyMem Silver Non-Adhesive Dressing, 4.25x4.25 in 1 x Per Week/30 Days ary Discharge Instructions: Apply to wound bed as instructed Secondary Dressing: ABD Pad, 8x10 1 x Per Week/30 Days Discharge Instructions: Apply over primary dressing as directed. Com pression Wrap: ThreePress (3 layer compression wrap) 1 x Per Week/30 Days Discharge Instructions: Apply three layer compression as directed. 1. In office sharp debridement 2. PolyMem silver with Keystone antibiotic under 3 layer compression 3. Follow-up in 1 week Electronic Signature(s) Signed: 04/14/2022 8:49:26 AM By: Kalman Shan DO Entered By: Kalman Shan on 04/14/2022 08:48:19 -------------------------------------------------------------------------------- HxROS Details Patient Name: Date of Service: Brian Leon, RO NN P. 04/14/2022 8:15 A M Medical Record Number: [id number] Patient Account Number: [id number] Date of Birth/Sex: Treating RN: Jul 24, 1946 (76 y.o. Brian Leon Primary Care Provider: [name] Other Clinician: Referring Provider: Treating Provider/Extender: [name] Weeks in Treatment: 22 Information Obtained From Chart Eyes Medical History: Negative for: Cataracts; Glaucoma; Optic Neuritis Past Medical History Notes: Blind right eye WEARS PROSTHESIS Ear/Nose/Mouth/Throat Medical History: Negative for: Chronic sinus problems/congestion; Middle ear problems Past Medical History Notes: Allergic rhinitis Hematologic/Lymphatic Medical  History: Negative for: Anemia; Hemophilia; Human Immunodeficiency Virus; Lymphedema; Sickle Cell Disease Respiratory Medical History: Positive for: Asthma Negative for: Aspiration; Chronic Obstructive Pulmonary Disease (COPD); Pneumothorax; Sleep Apnea; Tuberculosis Past Medical History Notes: Pneumonia Cardiovascular Medical History: Positive  for: Hypertension Negative for: Angina; Arrhythmia; Congestive Heart Failure; Coronary Artery Disease; Deep Vein Thrombosis; Hypotension; Myocardial Infarction; Peripheral Arterial Disease; Peripheral Venous Disease; Phlebitis; Vasculitis Past Medical History Notes: Thoracic aortic aneurysm Thrombocytosis Gastrointestinal Medical History: Positive for: Hepatitis A Negative for: Cirrhosis ; Colitis; Crohns; Hepatitis B; Hepatitis C Endocrine Medical History: Negative for: Type I Diabetes; Type II Diabetes Genitourinary Medical History: Negative for: End Stage Renal Disease Past Medical History Notes: 1/3kidney missing per patient-Renal Cell carcinoma Renal Cyst 5 years ago Immunological Medical History: Negative for: Lupus Erythematosus; Raynauds; Scleroderma Integumentary (Skin) Medical History: Negative for: History of Burn Musculoskeletal Medical History: Positive for: Rheumatoid Arthritis Negative for: Gout; Osteoarthritis; Osteomyelitis Past Medical History Notes: 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 Eczema Neurologic Medical History: Past Medical History Notes: Paraureteric diverticulum Oncologic Medical History: Past Medical History Notes: Renal Cell carcinoma- 5 years ago Psychiatric Medical History: Negative for: Anorexia/bulimia; Confinement Anxiety Immunizations Pneumococcal Vaccine: Received Pneumococcal Vaccination: Yes Received Pneumococcal Vaccination On or After 60th Birthday: Yes Implantable Devices No devices added Hospitalization / Surgery  History Type of Hospitalization/Surgery 5 years ago Renal Cell carcinoma Family and Social History Cancer: Yes - Mother; Diabetes: No; Heart Disease: No; Hypertension: No; Kidney Disease: No; Lung Disease: No; Seizures: No; Stroke: No; Thyroid Problems: No; Tuberculosis: No; Former smoker - quit 40 years ago; Marital Status - Married; Alcohol Use: Never; Drug Use: Current History - marijuana; Caffeine Use: Rarely; Financial Concerns: No; Food, Clothing or Shelter Needs: No; Support System Lacking: No; Transportation Concerns: No Electronic Signature(s) Signed: 04/14/2022 8:49:26 AM By: Kalman Shan DO Signed: 04/14/2022 4:34:49 PM By: Deon Pilling RN, BSN Entered By: Kalman Shan on 04/14/2022 08:46:43 -------------------------------------------------------------------------------- [place] Details Patient Name: Date of Service: Brian Leon, RO NN P. 04/14/2022 Medical Record Number: [id number] Patient Account Number: [id number] Date of Birth/Sex: Treating RN: 1946/01/07 (76 y.o. 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 S41.101A Unspecified open wound of right upper arm, initial encounter T14.90XA Injury, unspecified, initial encounter M35.3 Polymyalgia rheumatica I87.2 Venous insufficiency (chronic) (peripheral) Facility Procedures CPT4 Code: [id number] Description: 68341 - DEB SUBQ TISSUE 20 SQ CM/< ICD-10 Diagnosis Description D62.229 Non-pressure chronic ulcer of unspecified part of right lower leg with fat lay L97.822 Non-pressure chronic ulcer of other part of left lower leg with fat layer expo Modifier: er  exposed sed Quantity: 1 Physician Procedures : CPT4 Code Description Modifier [id number] - WC PHYS SUBQ TISS 20 SQ CM ICD-10 Diagnosis Description Y81.448 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

## 2022-04-21 ENCOUNTER — Encounter (HOSPITAL_BASED_OUTPATIENT_CLINIC_OR_DEPARTMENT_OTHER): Payer: Medicare Other | Admitting: Internal Medicine

## 2022-04-21 DIAGNOSIS — L97822 Non-pressure chronic ulcer of other part of left lower leg with fat layer exposed: Secondary | ICD-10-CM | POA: Diagnosis not present

## 2022-04-21 DIAGNOSIS — T1490XA Injury, unspecified, initial encounter: Secondary | ICD-10-CM

## 2022-04-21 DIAGNOSIS — L97912 Non-pressure chronic ulcer of unspecified part of right lower leg with fat layer exposed: Secondary | ICD-10-CM | POA: Diagnosis not present

## 2022-04-21 DIAGNOSIS — I872 Venous insufficiency (chronic) (peripheral): Secondary | ICD-10-CM

## 2022-04-21 NOTE — Progress Notes (Signed)
ANDRUS, SHARP ([id number]) Visit Report for 04/21/2022 Chief Complaint Document Details Patient Name: Date of Service: Brian Leon, [name] 04/21/2022 8:15 A M Medical Record Number: [id number] Patient Account Number: [id number] Date of Birth/Sex: Treating RN: October 15, 1945 (76 y.o. Brian Leon Primary Care Provider: [name] Other Clinician: Referring Provider: Treating Provider/Extender: [name] Weeks in Treatment: 38 Information Obtained from: Patient Chief Complaint 11/11/2021; Left lower extremity wound due to trauma 02/03/2022; bilateral lower extremity wounds due to trauma Electronic Signature(s) Signed: 04/21/2022 2:00:38 PM By: Kalman Shan DO Entered By: Kalman Shan on 04/21/2022 10:04:32 -------------------------------------------------------------------------------- HPI Details Patient Name: Date of Service: Brian Leon, RO NN P. 04/21/2022 8:15 A M Medical Record Number: [id number] Patient Account Number: [id number] Date of Birth/Sex: Treating RN: 14-Jan-1946 (76 y.o. Brian Leon Primary Care Provider: [name] Other Clinician: Referring Provider: Treating Provider/Extender: [name] Weeks in Treatment: 57 History of Present Illness HPI Description: Admission 11/11/2021 Brian Leon is a 76 year old male with a past medical history of polymyalgia rheumatica that presents to the clinic for a 23-monthhistory of nonhealing wound to the left lower extremity. He states that on Halloween he hit an object and created a wound that has not healed. He states he has seen dermatology and his primary care physician for this issue. On one occasion he had an UHaematologist He has also used Xeroform. It does not sound like he uses any kind of wound dressing consistently. He currently reports chronic pain to the wound site. He denies systemic signs of infection. He denies purulent drainage. 2/16; patient presents for follow-up. He  has been using Hydrofera Blue and gentamicin ointment to the wound bed without issues. He reports improvement in wound healing. He currently denies signs of infection. 2/23; patient presents for follow-up. He continues to use Hydrofera Blue and gentamicin ointment to the wound bed without issues. He denies signs of infection. He reports improvement in his chronic pain to the wound bed. 3/2; patient presents for follow-up. He has been using Hydrofera Blue and gentamicin to the wound bed without issues. [place] He currently denies signs of infection. 3/24; patient presents for follow-up. [place] He enjoyed his trip. He reports no issues today. He has been using Hydrofera Blue and gentamicin ointment to the wound beds without issues. 4/7; patient presents for follow-up. He has been using Hydrofera Blue to the wound bed without issues. He reports that his wound is healed. 02/03/2022; patient was discharged 1 month ago with a closed wound to the left lower extremity. Unfortunately over the past month he has developed 3 new wounds he states was caused by hitting his legs against different objects. He has been keeping the areas covered with DuoDERM. He reports mild tenderness to the right lower extremity wound bed with increased redness. He denies purulent drainage. He states he has his previous wound care supplies of Hydrofera Blue and gentamicin ointment. 5/11; patient presents for follow-up. He had a PCR culture done at last clinic visit that showed high levels of Staph aureus and viridans and low levels of coagulase-negative staph. I started him on Augmentin at last clinic visit but he reports continued pain and erythema to the wound sites. He has been using gentamicin ointment and Hydrofera Blue with dressing changes. He also presents with a 100.2 temp  cough and diarrhea. He has not taken anything for  his symptoms. 5/18; patient presents for follow-up. He received his Keystone antibiotics and started using this. Unfortunately he mixed it incorrectly and it turned into a hardened substance. He had to use his refill to start all over again. He has only been using this for 1 day. He states he is combining this correctly now. He completed his course of clindamycin. He denies signs of infection. 5/25; patient presents for follow-up. He has been using Keystone antibiotics with Sorbact. He reports more serous drainage from the left lower extremity. Other than that he has no issues or complaints. He denies signs of infection. 6/1; patient presents for follow-up. We switched to Iodoflex and Keystone antibiotic under compression therapy at last clinic visit. He tolerated this treatment well. He has no issues or complaints today. He denies signs of infection. Unfortunately he will be out of town for the next 3 weeks. 6/22; patient presents for follow-up. He was in [place] for the past 3 weeks. He has been using compression stockings daily along with [facility] antibiotic and Hydrofera Blue. Unfortunately he developed a skin tear to his right arm after hitting a door. 6/29; patient presents for follow-up. We have been using Keystone antibiotics with PolyMem silver to the lower extremities under 3 layer compression. Apparently he has a skin tear to the lateral Aspect of the right leg that he states was there previously. I had not seen it before. His skin tear on his right arm is well-healing with the use of bacitracin. He has no issues or complaints today. 7/6; patient presents for follow-up. We have been using Keystone antibiotics with PolyMem silver to the lower extremities under 3 layer compression. And antibiotic ointment to the right lateral leg wound. Patient reports improvement in wound healing. 7/13; patient presents for follow-up. We have been using Keystone antibiotics and PolyMem silver to all the  wound beds. Patient denies signs of infection. He tolerated the compression wraps well. He is currently being treated for C. difficile with vancomycin. 7/20; patient presents for follow-up. We have been using Keystone antibiotic and PolyMem silver to all wound beds. He continues to do well with compression wraps bilaterally. He has no issues or complaints today. Electronic Signature(s) Signed: 04/21/2022 2:00:38 PM By: Kalman Shan DO Entered By: Kalman Shan on 04/21/2022 10:05:03 -------------------------------------------------------------------------------- Physical Exam Details Patient Name: Date of Service: Brian Leon, RO NN P. 04/21/2022 8:15 A M Medical Record Number: [id number] Patient Account Number: [id number] Date of Birth/Sex: Treating RN: [birth year] (76 y.o. Brian Leon Primary Care Provider: [name] Other Clinician: Referring Provider: Treating Provider/Extender: [name] Weeks in Treatment: 23 Constitutional respirations regular, non-labored and within target range for patient.. Cardiovascular 2+ dorsalis pedis/posterior tibialis pulses. Psychiatric pleasant and cooperative. Notes Multiple open wounds to his lower extremities bilaterally with granulation tissue and fibrinous tissue present. No signs of surrounding infection. Good edema control. Electronic Signature(s) Signed: 04/21/2022 2:00:38 PM By: Kalman Shan DO Entered By: Kalman Shan on 04/21/2022 10:05:33 -------------------------------------------------------------------------------- Physician Orders Details Patient Name: Date of Service: Brian Leon, [place] NN P. 04/21/2022 8:15 A M Medical Record Number: [id number] Patient Account Number: [id number] Date of Birth/Sex: Treating RN: September 17, 1946 (76 y.o. Brian Leon Primary Care Provider: [name] Other Clinician: Referring Provider: Treating Provider/Extender: [name] Weeks in  Treatment: 54 Verbal / Phone Orders: No Diagnosis Coding ICD-10 Coding Code Description [phone] Non-pressure chronic ulcer of other part  of left lower leg with fat layer exposed L97.912 Non-pressure chronic ulcer of unspecified part of right lower leg with fat layer exposed S41.101A Unspecified open wound of right upper arm, initial encounter T14.90XA Injury, unspecified, initial encounter M35.3 Polymyalgia rheumatica I87.2 Venous insufficiency (chronic) (peripheral) Follow-up Appointments ppointment in 1 week. - Dr. Heber [place] [facility] [place], Room 8 Thursday 0815 04/28/2022 Return A ppointment in 2 weeks. - Dr. Heber [place] [facility] [place], Room 8 Thursday 0815 05/05/2022 Return A Bathing/ Shower/ Hygiene May shower with protection but do not get wound dressing(s) wet. Edema Control - Lymphedema / SCD / Other Elevate legs to the level of the heart or above for 30 minutes daily and/or when sitting, a frequency of: - 3-4 times a day throughout the day. Avoid standing for long periods of time. Exercise regularly Moisturize legs daily. - every night before bed. do not get directly on wounds Wound Treatment Wound #2 - Lower Leg Wound Laterality: Left, Anterior Cleanser: Soap and Water 1 x Per Week/30 Days Discharge Instructions: May shower and wash wound with dial antibacterial soap and water prior to dressing change. Peri-Wound Care: Sween Lotion (Moisturizing lotion) 1 x Per Week/30 Days Discharge Instructions: Apply moisturizing lotion as directed Topical: topical compounding antibiotics 1 x Per Week/30 Days Discharge Instructions: apply directly to wound bed. Prim Dressing: PolyMem Silver Non-Adhesive Dressing, 4.25x4.25 in 1 x Per Week/30 Days ary Discharge Instructions: Apply to wound bed as instructed Secondary Dressing: ABD Pad, 8x10 1 x Per Week/30 Days Discharge Instructions: Apply over primary dressing as directed. Compression Wrap: ThreePress (3 layer compression wrap) 1 x Per Week/30  Days Discharge Instructions: Apply three layer compression as directed. Wound #3 - Lower Leg Wound Laterality: Right, Medial Cleanser: Soap and Water 1 x Per Week/30 Days Discharge Instructions: May shower and wash wound with dial antibacterial soap and water prior to dressing change. Peri-Wound Care: Sween Lotion (Moisturizing lotion) 1 x Per Week/30 Days Discharge Instructions: Apply moisturizing lotion as directed Topical: topical compounding antibiotics 1 x Per Week/30 Days Discharge Instructions: apply directly to wound bed. Prim Dressing: PolyMem Silver Non-Adhesive Dressing, 4.25x4.25 in 1 x Per Week/30 Days ary Discharge Instructions: Apply to wound bed as instructed Secondary Dressing: ABD Pad, 8x10 1 x Per Week/30 Days Discharge Instructions: Apply over primary dressing as directed. Compression Wrap: ThreePress (3 layer compression wrap) 1 x Per Week/30 Days Discharge Instructions: Apply three layer compression as directed. Wound #4 - Lower Leg Wound Laterality: Right, Anterior Cleanser: Soap and Water 1 x Per Week/30 Days Discharge Instructions: May shower and wash wound with dial antibacterial soap and water prior to dressing change. Peri-Wound Care: Sween Lotion (Moisturizing lotion) 1 x Per Week/30 Days Discharge Instructions: Apply moisturizing lotion as directed Topical: topical compounding antibiotics 1 x Per Week/30 Days Discharge Instructions: apply directly to wound bed. Prim Dressing: PolyMem Silver Non-Adhesive Dressing, 4.25x4.25 in 1 x Per Week/30 Days ary Discharge Instructions: Apply to wound bed as instructed Secondary Dressing: ABD Pad, 8x10 1 x Per Week/30 Days Discharge Instructions: Apply over primary dressing as directed. Compression Wrap: ThreePress (3 layer compression wrap) 1 x Per Week/30 Days Discharge Instructions: Apply three layer compression as directed. Electronic Signature(s) Signed: 04/21/2022 2:00:38 PM By: Kalman Shan DO Entered By:  Kalman Shan on 04/21/2022 10:05:45 -------------------------------------------------------------------------------- Problem List Details Patient Name: Date of Service: Brian Leon, [place] NN P. 04/21/2022 8:15 A M Medical Record Number: [id number] Patient Account Number: [id number] Date of Birth/Sex: Treating RN: Feb 12, 1946 (76 y.o. [name], [name] Primary  Care Provider: [name] Other Clinician: Referring Provider: Treating Provider/Extender: [name] Weeks in Treatment: 23 Active Problems ICD-10 Encounter Code Description Active Date MDM Diagnosis L97.822 Non-pressure chronic ulcer of other part of left lower leg with fat layer exposed2/06/2022 No Yes L97.912 Non-pressure chronic ulcer of unspecified part of right lower leg with fat layer 02/03/2022 No Yes exposed S41.101A Unspecified open wound of right upper arm, initial encounter 03/24/2022 No Yes T14.90XA Injury, unspecified, initial encounter 11/11/2021 No Yes M35.3 Polymyalgia rheumatica 11/11/2021 No Yes I87.2 Venous insufficiency (chronic) (peripheral) 02/24/2022 No Yes Inactive Problems Resolved Problems Electronic Signature(s) Signed: 04/21/2022 2:00:38 PM By: Kalman Shan DO Entered By: Kalman Shan on 04/21/2022 10:03:26 -------------------------------------------------------------------------------- Progress Note Details Patient Name: Date of Service: Brian Leon, [place] NN P. 04/21/2022 8:15 A M Medical Record Number: [id number] Patient Account Number: [id number] Date of Birth/Sex: Treating RN: 09-16-1946 (76 y.o. Brian Leon Primary Care Provider: [name] Other Clinician: Referring Provider: Treating Provider/Extender: [name] Weeks in Treatment: 55 Subjective Chief Complaint Information obtained from Patient 11/11/2021; Left lower extremity wound due to trauma 02/03/2022; bilateral lower extremity wounds due to trauma History of Present Illness  (HPI) Admission 11/11/2021 Brian Leon is a 76 year old male with a past medical history of polymyalgia rheumatica that presents to the clinic for a 34-monthhistory of nonhealing wound to the left lower extremity. He states that on Halloween he hit an object and created a wound that has not healed. He states he has seen dermatology and his primary care physician for this issue. On one occasion he had an UHaematologist He has also used Xeroform. It does not sound like he uses any kind of wound dressing consistently. He currently reports chronic pain to the wound site. He denies systemic signs of infection. He denies purulent drainage. 2/16; patient presents for follow-up. He has been using Hydrofera Blue and gentamicin ointment to the wound bed without issues. He reports improvement in wound healing. He currently denies signs of infection. 2/23; patient presents for follow-up. He continues to use Hydrofera Blue and gentamicin ointment to the wound bed without issues. He denies signs of infection. He reports improvement in his chronic pain to the wound bed. 3/2; patient presents for follow-up. He has been using Hydrofera Blue and gentamicin to the wound bed without issues. [place] He currently denies signs of infection. 3/24; patient presents for follow-up. [place]. He enjoyed his trip. He reports no issues today. He has been using Hydrofera Blue and gentamicin ointment to the wound beds without issues. 4/7; patient presents for follow-up. He has been using Hydrofera Blue to the wound bed without issues. He reports that his wound is healed. 02/03/2022; patient was discharged 1 month ago with a closed wound to the left lower extremity. Unfortunately over the past month he has developed 3 new wounds he states was caused by hitting his legs against different objects. He has been keeping the areas covered with  DuoDERM. He reports mild tenderness to the right lower extremity wound bed with increased redness. He denies purulent drainage. He states he has his previous wound care supplies of Hydrofera Blue and gentamicin ointment. 5/11; patient presents for follow-up. He had a PCR culture done at last clinic visit that showed high levels of Staph aureus and viridans and low levels of coagulase-negative staph.  I started him on Augmentin at last clinic visit but he reports continued pain and erythema to the wound sites. He has been using gentamicin ointment and Hydrofera Blue with dressing changes. He also presents with a 100.2 temp cough and diarrhea. He has not taken anything for his symptoms. 5/18; patient presents for follow-up. He received his Keystone antibiotics and started using this. Unfortunately he mixed it incorrectly and it turned into a hardened substance. He had to use his refill to start all over again. He has only been using this for 1 day. He states he is combining this correctly now. He completed his course of clindamycin. He denies signs of infection. 5/25; patient presents for follow-up. He has been using Keystone antibiotics with Sorbact. He reports more serous drainage from the left lower extremity. Other than that he has no issues or complaints. He denies signs of infection. 6/1; patient presents for follow-up. We switched to Iodoflex and Keystone antibiotic under compression therapy at last clinic visit. He tolerated this treatment well. He has no issues or complaints today. He denies signs of infection. Unfortunately he will be out of town for the next 3 weeks. 6/22; patient presents for follow-up. He was in [place] for the past 3 weeks. He has been using compression stockings daily along with [facility] antibiotic and Hydrofera Blue. Unfortunately he developed a skin tear to his right arm after hitting a door. 6/29; patient presents for follow-up. We have been using Keystone antibiotics  with PolyMem silver to the lower extremities under 3 layer compression. Apparently he has a skin tear to the lateral Aspect of the right leg that he states was there previously. I had not seen it before. His skin tear on his right arm is well-healing with the use of bacitracin. He has no issues or complaints today. 7/6; patient presents for follow-up. We have been using Keystone antibiotics with PolyMem silver to the lower extremities under 3 layer compression. And antibiotic ointment to the right lateral leg wound. Patient reports improvement in wound healing. 7/13; patient presents for follow-up. We have been using Keystone antibiotics and PolyMem silver to all the wound beds. Patient denies signs of infection. He tolerated the compression wraps well. He is currently being treated for C. difficile with vancomycin. 7/20; patient presents for follow-up. We have been using Keystone antibiotic and PolyMem silver to all wound beds. He continues to do well with compression wraps bilaterally. He has no issues or complaints today. Patient History Information obtained from Chart. Family History Cancer - Mother, No family history of Diabetes, Heart Disease, Hypertension, Kidney Disease, Lung Disease, Seizures, Stroke, Thyroid Problems, Tuberculosis. Social History Former smoker - quit 40 years ago, Marital Status - Married, Alcohol Use - Never, Drug Use - Current History - marijuana, Caffeine Use - Rarely. Medical History Eyes Denies history of Cataracts, Glaucoma, Optic Neuritis Ear/Nose/Mouth/Throat Denies history of Chronic sinus problems/congestion, Middle ear problems Hematologic/Lymphatic Denies history of Anemia, Hemophilia, Human Immunodeficiency Virus, Lymphedema, Sickle Cell Disease Respiratory Patient has history of Asthma Denies history of Aspiration, Chronic Obstructive Pulmonary Disease (COPD), Pneumothorax, Sleep Apnea, Tuberculosis Cardiovascular Patient has history of  Hypertension Denies history of Angina, Arrhythmia, Congestive Heart Failure, Coronary Artery Disease, Deep Vein Thrombosis, Hypotension, Myocardial Infarction, Peripheral Arterial Disease, Peripheral Venous Disease, Phlebitis, Vasculitis Gastrointestinal Patient has history of Hepatitis A Denies history of Cirrhosis , Colitis, Crohnoos, Hepatitis B, Hepatitis C Endocrine Denies history of Type I Diabetes, Type II Diabetes Genitourinary Denies history of End Stage Renal Disease Immunological  Denies history of Lupus Erythematosus, Raynaudoos, Scleroderma Integumentary (Skin) Denies history of History of Burn Musculoskeletal Patient has history of Rheumatoid Arthritis Denies history of Gout, Osteoarthritis, Osteomyelitis Psychiatric Denies history of Anorexia/bulimia, Confinement Anxiety Hospitalization/Surgery History - 5 years ago Renal Cell carcinoma. 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 Vitals Time Taken: 8:20 AM, Height: 68 in, Weight: 140 lbs, BMI: 21.3, Temperature: 97.8 F, Pulse: 67 bpm, Respiratory Rate: 16 breaths/min, Blood Pressure: 136/76 mmHg. Cardiovascular 2+ dorsalis pedis/posterior tibialis pulses. Psychiatric pleasant and cooperative. General Notes: Multiple open wounds to his lower extremities bilaterally with granulation tissue and fibrinous tissue present. No signs of surrounding infection. Good edema control. Integumentary  (Hair, Skin) Wound #2 status is Open. Original cause of wound was Trauma. The date acquired was: 01/04/2022. The wound has been in treatment 11 weeks. The wound is located on the Left,Anterior Lower Leg. The wound measures 0.8cm length x 1cm width x 0.1cm depth; 0.628cm^2 area and 0.063cm^3 volume. There is Fat Layer (Subcutaneous Tissue) exposed. There is no tunneling or undermining noted. There is a medium amount of serosanguineous drainage noted. The wound margin is distinct with the outline attached to the wound base. There is large (67-100%) pink granulation within the wound bed. There is a small (1-33%) amount of necrotic tissue within the wound bed including Adherent Slough. Wound #3 status is Open. Original cause of wound was Trauma. The date acquired was: 01/24/2022. The wound has been in treatment 11 weeks. The wound is located on the Right,Medial Lower Leg. The wound measures 1cm length x 0.8cm width x 0.1cm depth; 0.628cm^2 area and 0.063cm^3 volume. There is Fat Layer (Subcutaneous Tissue) exposed. There is no tunneling or undermining noted. There is a medium amount of serosanguineous drainage noted. The wound margin is distinct with the outline attached to the wound base. There is large (67-100%) red, pink, friable granulation within the wound bed. There is no necrotic tissue within the wound bed. Wound #4 status is Open. Original cause of wound was Trauma. The date acquired was: 01/04/2022. The wound has been in treatment 11 weeks. The wound is located on the Right,Anterior Lower Leg. The wound measures 1.3cm length x 1.5cm width x 0.1cm depth; 1.532cm^2 area and 0.153cm^3 volume. There is Fat Layer (Subcutaneous Tissue) exposed. There is no tunneling or undermining noted. There is a medium amount of serosanguineous drainage noted. The wound margin is distinct with the outline attached to the wound base. There is large (67-100%) pink granulation within the wound bed. There is a small (1-33%)  amount of necrotic tissue within the wound bed including Adherent Slough. Wound #6 status is Healed - Epithelialized. Original cause of wound was Laceration. The date acquired was: 03/25/2022. The wound has been in treatment 3 weeks. The wound is located on the Right,Lateral Lower Leg. The wound measures 0cm length x 0cm width x 0cm depth; 0cm^2 area and 0cm^3 volume. There is no tunneling or undermining noted. There is a none present amount of drainage noted. The wound margin is distinct with the outline attached to the wound base. There is no granulation within the wound bed. There is no necrotic tissue within  the wound bed. 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 His right lateral leg wound is healed. Debridement was not needed today. I recommended continuing the course with [facility] [place] antibiotic and PolyMem silver under compression therapy. Follow-up in 1 week. Procedures Wound #2 Pre-procedure diagnosis of Wound #2 is an Abrasion located on the Left,Anterior Lower Leg . There was a Three Layer Compression Therapy Procedure by Deon Pilling, RN. Post procedure Diagnosis Wound #2: Same as Pre-Procedure Wound #3 Pre-procedure diagnosis of Wound #3 is an Abrasion located on the Right,Medial Lower Leg . There was a Three Layer Compression Therapy Procedure by Deon Pilling, RN. Post procedure Diagnosis Wound #3: Same as Pre-Procedure Wound #4 Pre-procedure diagnosis of Wound #4 is an Abrasion located on the Right,Anterior Lower Leg . There was a Three Layer Compression Therapy  Procedure by Deon Pilling, RN. Post procedure Diagnosis Wound #4: Same as Pre-Procedure Plan Follow-up Appointments: Return Appointment in 1 week. - Dr. Heber [place] and [place], Room 8 Thursday 0815 04/28/2022 Return Appointment in 2 weeks. - Dr. Heber [place] and [place], Room 8 Thursday 0815 05/05/2022 Bathing/ Shower/ Hygiene: May shower with protection but do not get wound dressing(s) wet. Edema Control - Lymphedema / SCD / Other: Elevate legs to the level of the heart or above for 30 minutes daily and/or when sitting, a frequency of: - 3-4 times a day throughout the day. Avoid standing for long periods of time. Exercise regularly Moisturize legs daily. - every night before bed. do not get directly on wounds WOUND #2: - Lower Leg Wound Laterality: Left, Anterior Cleanser: Soap and Water 1 x Per Week/30 Days Discharge Instructions: May shower and wash wound with dial antibacterial soap and water prior to dressing change. Peri-Wound Care: Sween Lotion (Moisturizing lotion) 1 x Per Week/30 Days Discharge Instructions: Apply moisturizing lotion as directed Topical: topical compounding antibiotics 1 x Per Week/30 Days Discharge Instructions: apply directly to wound bed. Prim Dressing: PolyMem Silver Non-Adhesive Dressing, 4.25x4.25 in 1 x Per Week/30 Days ary Discharge Instructions: Apply to wound bed as instructed Secondary Dressing: ABD Pad, 8x10 1 x Per Week/30 Days Discharge Instructions: Apply over primary dressing as directed. Com pression Wrap: ThreePress (3 layer compression wrap) 1 x Per Week/30 Days Discharge Instructions: Apply three layer compression as directed. WOUND #3: - Lower Leg Wound Laterality: Right, Medial Cleanser: Soap and Water 1 x Per Week/30 Days Discharge Instructions: May shower and wash wound with dial antibacterial soap and water prior to dressing change. Peri-Wound Care: Sween Lotion (Moisturizing lotion) 1 x Per Week/30 Days Discharge Instructions: Apply moisturizing  lotion as directed Topical: topical compounding antibiotics 1 x Per Week/30 Days Discharge Instructions: apply directly to wound bed. Prim Dressing: PolyMem Silver Non-Adhesive Dressing, 4.25x4.25 in 1 x Per Week/30 Days ary Discharge Instructions: Apply to wound bed as instructed Secondary Dressing: ABD Pad, 8x10 1 x Per Week/30 Days Discharge Instructions: Apply over primary dressing as directed. Com pression Wrap: ThreePress (3 layer compression wrap) 1 x Per Week/30 Days Discharge Instructions: Apply three layer compression as directed. WOUND #4: - Lower Leg Wound Laterality: Right, Anterior Cleanser: Soap and Water 1 x Per Week/30 Days Discharge Instructions: May shower and  wash wound with dial antibacterial soap and water prior to dressing change. Peri-Wound Care: Sween Lotion (Moisturizing lotion) 1 x Per Week/30 Days Discharge Instructions: Apply moisturizing lotion as directed Topical: topical compounding antibiotics 1 x Per Week/30 Days Discharge Instructions: apply directly to wound bed. Prim Dressing: PolyMem Silver Non-Adhesive Dressing, 4.25x4.25 in 1 x Per Week/30 Days ary Discharge Instructions: Apply to wound bed as instructed Secondary Dressing: ABD Pad, 8x10 1 x Per Week/30 Days Discharge Instructions: Apply over primary dressing as directed. Com pression Wrap: ThreePress (3 layer compression wrap) 1 x Per Week/30 Days Discharge Instructions: Apply three layer compression as directed. 1. [facility] antibiotic and PolyMem silver under 3 layer compression 2. Follow-up in 1 week Electronic Signature(s) Signed: 04/21/2022 2:00:38 PM By: Kalman Shan DO Entered By: Kalman Shan on 04/21/2022 10:07:55 -------------------------------------------------------------------------------- HxROS Details Patient Name: Date of Service: Brian Leon, RO NN P. 04/21/2022 8:15 A M Medical Record Number: [id number] Patient Account Number: [id number] Date of Birth/Sex: Treating  RN: 05-25-1946 (76 y.o. Brian Leon Primary Care Provider: [name] Other Clinician: Referring Provider: Treating Provider/Extender: [name] Weeks in Treatment: 15 Information Obtained From Chart Eyes Medical History: Negative for: Cataracts; Glaucoma; Optic Neuritis Past Medical History Notes: Blind right eye WEARS PROSTHESIS Ear/Nose/Mouth/Throat Medical History: Negative for: Chronic sinus problems/congestion; Middle ear problems Past Medical History Notes: Allergic rhinitis Hematologic/Lymphatic Medical History: Negative for: Anemia; Hemophilia; Human Immunodeficiency Virus; Lymphedema; Sickle Cell Disease Respiratory Medical History: Positive for: Asthma Negative for: Aspiration; Chronic Obstructive Pulmonary Disease (COPD); Pneumothorax; Sleep Apnea; Tuberculosis Past Medical History Notes: Pneumonia Cardiovascular Medical History: Positive for: Hypertension Negative for: Angina; Arrhythmia; Congestive Heart Failure; Coronary Artery Disease; Deep Vein Thrombosis; Hypotension; Myocardial Infarction; Peripheral Arterial Disease; Peripheral Venous Disease; Phlebitis; Vasculitis Past Medical History Notes: Thoracic aortic aneurysm Thrombocytosis Gastrointestinal Medical History: Positive for: Hepatitis A Negative for: Cirrhosis ; Colitis; Crohns; Hepatitis B; Hepatitis C Endocrine Medical History: Negative for: Type I Diabetes; Type II Diabetes Genitourinary Medical History: Negative for: End Stage Renal Disease Past Medical History Notes: 1/3kidney missing per patient-Renal Cell carcinoma Renal Cyst 5 years ago Immunological Medical History: Negative for: Lupus Erythematosus; Raynauds; Scleroderma Integumentary (Skin) Medical History: Negative for: History of Burn Musculoskeletal Medical History: Positive for: Rheumatoid Arthritis Negative for: Gout; Osteoarthritis; Osteomyelitis Past Medical History Notes: 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 Actinic Keratoses Eczema Neurologic Medical History: Past Medical History Notes: Paraureteric diverticulum Oncologic Medical History: Past Medical History Notes: Renal Cell carcinoma- 5 years ago Psychiatric Medical History: Negative for: Anorexia/bulimia; Confinement Anxiety Immunizations Pneumococcal Vaccine: Received Pneumococcal Vaccination: Yes Received Pneumococcal Vaccination On or After 60th Birthday: Yes Implantable Devices No devices added Hospitalization / Surgery History Type of Hospitalization/Surgery 5 years ago Renal Cell carcinoma Family and Social History Cancer: Yes - Mother; Diabetes: No; Heart Disease: No; Hypertension: No; Kidney Disease: No; Lung Disease: No; Seizures: No; Stroke: No; Thyroid Problems: No; Tuberculosis: No; Former smoker - quit 40 years ago; Marital Status - Married; Alcohol Use: Never; Drug Use: Current History - marijuana; Caffeine Use: Rarely; Financial Concerns: No; Food, Clothing or Shelter Needs: No; Support System Lacking: No; Transportation Concerns: No Electronic Signature(s) Signed: 04/21/2022 2:00:38 PM By: Kalman Shan DO Signed: 04/21/2022 5:46:02 PM By: Deon Pilling RN, BSN Entered By: Kalman Shan on 04/21/2022 10:05:10 -------------------------------------------------------------------------------- SuperBill Details Patient Name: Date of Service: Brian Leon, RO NN P. 04/21/2022 Medical Record Number: [id number] Patient Account Number: [id number] Date of Birth/Sex: Treating RN: 1946/02/24 (76 y.o.  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 Modifier: Quantity: 1 Physician Procedures : CPT4 Code Description Modifier [id number] - WC PHYS LEVEL 3 - EST PT ICD-10 Diagnosis Description L97.822 Non-pressure chronic ulcer of other part of left lower leg with fat layer exposed L97.912 Non-pressure chronic ulcer of unspecified part of  right lower leg with fat layer exposed I87.2 Venous insufficiency (chronic) (peripheral) T14.90XA Injury, unspecified, initial encounter Quantity: 1 Electronic Signature(s) Signed: 04/21/2022 2:00:38 PM By: Kalman Shan DO Entered By: Kalman Shan on 04/21/2022 10:08:16

## 2022-04-21 NOTE — Progress Notes (Signed)
IDREES, QUAM ([id number]) Visit Report for 04/21/2022 Arrival Information Details Patient Name: Date of Service: Brian Leon, Brian Leon. 04/21/2022 8:15 A M Medical Record Number: [id number] Patient Account Number: [id number] Date of Birth/Sex: Treating Leon: [birth year] (76 y.o. Brian Leon, [name] Primary Care Omar Gayden: [name] Other Clinician: Referring Welma Mccombs: Treating Tynika Luddy/Extender: [name] Weeks in Treatment: 23 Visit Information History Since Last Visit Added or deleted any medications: No Patient Arrived: Ambulatory Any new allergies or adverse reactions: No Arrival Time: 08:20 Had a fall or experienced change in No Accompanied By: self activities of daily living that may affect Transfer Assistance: None risk of falls: Patient Identification Verified: Yes Signs or symptoms of abuse/neglect since last visito No Secondary Verification Process Completed: Yes Hospitalized since last visit: No Patient Requires Transmission-Based Precautions: No Implantable device outside of the clinic excluding No Patient Has Alerts: Yes cellular tissue based products placed in the center Patient Alerts: 11/02/21 L ABI: 1.37 since last visit: 11/02/2021 L TBI 0.77 Has Dressing in Place as Prescribed: Yes 11/02/2021 R TBI 0.69 Has Compression in Place as Prescribed: Yes 11/02/2021 R ABI 1.3 Pain Present Now: No Electronic Signature(s) Signed: 04/21/2022 5:46:02 PM By: Deon Pilling Leon, Brian Leon Entered By: Deon Pilling on 04/21/2022 08:30:07 -------------------------------------------------------------------------------- Compression Therapy Details Patient Name: Date of Service: Brian Leon, Brian Leon. 04/21/2022 8:15 A M Medical Record Number: [id number] Patient Account Number: [id number] Date of Birth/Sex: Treating Leon: April 30, 1946 (76 y.o. Brian Leon Primary Care Benancio Osmundson: [name] Other Clinician: Referring Paden Senger: Treating Kaleya Douse/Extender: [name] Weeks in Treatment: 23 Compression Therapy Performed for Wound Assessment: Wound #2 Left,Anterior Lower Leg Performed By: Clinician Deon Pilling, Leon Compression Type: Three Layer Post Procedure Diagnosis Same as Pre-procedure Electronic Signature(s) Signed: 04/21/2022 5:46:02 PM By: Deon Pilling Leon, Brian Leon Entered By: Deon Pilling on 04/21/2022 08:47:50 -------------------------------------------------------------------------------- Compression Therapy Details Patient Name: Date of Service: Brian Leon, Brian Leon. 04/21/2022 8:15 A M Medical Record Number: [id number] Patient Account Number: [id number] Date of Birth/Sex: Treating Leon: 05-25-1946 (76 y.o. Brian Leon Primary Care Evyn Putzier: [name] Other Clinician: Referring Howard Bunte: Treating Tekoa Amon/Extender: [name] Weeks in Treatment: 23 Compression Therapy Performed for Wound Assessment: Wound #4 Right,Anterior Lower Leg Performed By: Clinician Deon Pilling, Leon Compression Type: Three Layer Post Procedure Diagnosis Same as Pre-procedure Electronic Signature(s) Signed: 04/21/2022 5:46:02 PM By: Deon Pilling Leon, Brian Leon Entered By: Deon Pilling on 04/21/2022 08:47:50 -------------------------------------------------------------------------------- Compression Therapy Details Patient Name: Date of Service: Brian Leon, Brian Leon. 04/21/2022 8:15 A M Medical Record Number: [id number] Patient Account Number: [id number] Date of Birth/Sex: Treating Leon: 28-Oct-1945 (76 y.o. Brian Leon Primary Care Merlene Dante: [name] Other Clinician: Referring Joram Venson: Treating Darnetta Kesselman/Extender: [name] Weeks in Treatment: 23 Compression Therapy Performed for Wound Assessment: Wound #3 Right,Medial Lower Leg Performed By: Clinician Deon Pilling, Leon Compression Type: Three Layer Post Procedure Diagnosis Same as Pre-procedure Electronic Signature(s) Signed: 04/21/2022  5:46:02 PM By: Deon Pilling Leon, Brian Leon Entered By: Deon Pilling on 04/21/2022 08:47:50 -------------------------------------------------------------------------------- Encounter Discharge Information Details Patient Name: Date of Service: Brian Leon, Brian Leon. 04/21/2022 8:15 A M Medical Record Number: [id number] Patient Account Number: [id number] Date of Birth/Sex: Treating Leon: Aug 11, 1946 (76 y.o. Brian Leon Primary Care Ghada Abbett: [name] Other Clinician: Referring Zebastian Carico: Treating Piya Mesch/Extender: [name] Weeks in Treatment: 70 Encounter Discharge Information Items Discharge Condition:  Stable Ambulatory Status: Ambulatory Discharge Destination: Home Transportation: Private Auto Accompanied By: self Schedule Follow-up Appointment: Yes Clinical Summary of Care: Electronic Signature(s) Signed: 04/21/2022 5:46:02 PM By: Brian Leon, Brian Leon Entered By: Brian Stall on 04/21/2022 08:49:35 -------------------------------------------------------------------------------- Lower Extremity Assessment Details Patient Name: Date of Service: Brian Leon, Brian Leon. 04/21/2022 8:15 A M Medical Record Number: [id number] Patient Account Number: [id number] Date of Birth/Sex: Treating Leon: 06/08/1946 (76 y.o. Brian Leon Primary Care Tyjae Shvartsman: Toni Amend Other Clinician: Referring Lillyrose Reitan: Treating Dashun Borre/Extender: Doristine Bosworth Weeks in Treatment: 23 Edema Assessment Assessed: [Left: Yes] [Right: No] Edema: [Left: No] [Right: No] Calf Left: Right: Point of Measurement: 37 cm From Medial Instep 28 cm 28 cm Ankle Left: Right: Point of Measurement: 12 cm From Medial Instep 19 cm 19 cm Vascular Assessment Pulses: Dorsalis Pedis Palpable: [Left:Yes] [Right:Yes] Electronic Signature(s) Signed: 04/21/2022 5:46:02 PM By: Brian Leon, Brian Leon Entered By: Brian Stall on 04/21/2022  08:30:59 -------------------------------------------------------------------------------- Multi Wound Chart Details Patient Name: Date of Service: Brian Leon, Brian Leon. 04/21/2022 8:15 A M Medical Record Number: [id number] Patient Account Number: [id number] Date of Birth/Sex: Treating Leon: 06-21-1946 (76 y.o. Brian Leon, Brian Leon Primary Care Jacobie Stamey: Toni Amend Other Clinician: Referring Lem Peary: Treating Rajean Desantiago/Extender: Doristine Bosworth Weeks in Treatment: 23 Vital Signs Height(in): 68 Pulse(bpm): 67 Weight(lbs): 140 Blood Pressure(mmHg): 136/76 Body Mass Index(BMI): 21.3 Temperature(F): 97.8 Respiratory Rate(breaths/min): 16 Photos: Left, Anterior Lower Leg Right, Medial Lower Leg Right, Anterior Lower Leg Wound Location: Trauma Trauma Trauma Wounding Event: Abrasion Abrasion Abrasion Primary Etiology: Asthma, Hypertension, Hepatitis A, Asthma, Hypertension, Hepatitis A, Asthma, Hypertension, Hepatitis A, Comorbid History: Rheumatoid Arthritis Rheumatoid Arthritis Rheumatoid Arthritis 01/04/2022 01/24/2022 01/04/2022 Date Acquired: 11 11 11  Weeks of Treatment: Open Open Open Wound Status: No No No Wound Recurrence: 0.8x1x0.1 1x0.8x0.1 1.3x1.5x0.1 Measurements L x W x D (cm) 0.628 0.628 1.532 A (cm) : rea 0.063 0.063 0.153 Volume (cm) : -48.10% 94.90% -364.20% % Reduction in Area: -50.00% 94.90% -363.60% % Reduction in Volume: Full Thickness Without Exposed Full Thickness Without Exposed Full Thickness Without Exposed Classification: Support Structures Support Structures Support Structures Medium Medium Medium Exudate Amount: Serosanguineous Serosanguineous Serosanguineous Exudate Type: red, brown red, brown red, brown Exudate Color: Distinct, outline attached Distinct, outline attached Distinct, outline attached Wound Margin: Large (67-100%) Large (67-100%) Large (67-100%) Granulation Amount: Pink Red, Pink, Friable Pink Granulation  Quality: Small (1-33%) None Present (0%) Small (1-33%) Necrotic Amount: Fat Layer (Subcutaneous Tissue): Yes Fat Layer (Subcutaneous Tissue): Yes Fat Layer (Subcutaneous Tissue): Yes Exposed Structures: Fascia: No Fascia: No Fascia: No Tendon: No Tendon: No Tendon: No Muscle: No Muscle: No Muscle: No Joint: No Joint: No Joint: No Bone: No Bone: No Bone: No Medium (34-66%) Medium (34-66%) Small (1-33%) Epithelialization: Compression Therapy Compression Therapy Compression Therapy Procedures Performed: Wound Number: 6 N/A N/A Photos: N/A N/A Right, Lateral Lower Leg N/A N/A Wound Location: Laceration N/A N/A Wounding Event: Skin T ear N/A N/A Primary Etiology: Asthma, Hypertension, Hepatitis A, N/A N/A Comorbid History: Rheumatoid Arthritis 03/25/2022 N/A N/A Date Acquired: 3 N/A N/A Weeks of Treatment: Healed - Epithelialized N/A N/A Wound Status: No N/A N/A Wound Recurrence: 0x0x0 N/A N/A Measurements L x W x D (cm) 0 N/A N/A A (cm) : rea 0 N/A N/A Volume (cm) : 100.00% N/A N/A % Reduction in Area: 100.00% N/A N/A % Reduction in Volume: Full Thickness Without Exposed N/A N/A Classification: Support Structures None Present N/A N/A Exudate Amount: N/A  N/A N/A Exudate Type: N/A N/A N/A Exudate Color: Distinct, outline attached N/A N/A Wound Margin: None Present (0%) N/A N/A Granulation Amount: N/A N/A N/A Granulation Quality: None Present (0%) N/A N/A Necrotic Amount: Fascia: No N/A N/A Exposed Structures: Fat Layer (Subcutaneous Tissue): No Tendon: No Muscle: No Joint: No Bone: No Large (67-100%) N/A N/A Epithelialization: N/A N/A N/A Procedures Performed: Treatment Notes Wound #2 (Lower Leg) Wound Laterality: Left, Anterior Cleanser Soap and Water Discharge Instruction: May shower and wash wound with dial antibacterial soap and water prior to dressing change. Peri-Wound Care Sween Lotion (Moisturizing lotion) Discharge  Instruction: Apply moisturizing lotion as directed Topical topical compounding antibiotics Discharge Instruction: apply directly to wound bed. Primary Dressing PolyMem Silver Non-Adhesive Dressing, 4.25x4.25 in Discharge Instruction: Apply to wound bed as instructed Secondary Dressing ABD Pad, 8x10 Discharge Instruction: Apply over primary dressing as directed. Secured With Compression Wrap ThreePress (3 layer compression wrap) Discharge Instruction: Apply three layer compression as directed. Compression Stockings Add-Ons Wound #3 (Lower Leg) Wound Laterality: Right, Medial Cleanser Soap and Water Discharge Instruction: May shower and wash wound with dial antibacterial soap and water prior to dressing change. Peri-Wound Care Sween Lotion (Moisturizing lotion) Discharge Instruction: Apply moisturizing lotion as directed Topical topical compounding antibiotics Discharge Instruction: apply directly to wound bed. Primary Dressing PolyMem Silver Non-Adhesive Dressing, 4.25x4.25 in Discharge Instruction: Apply to wound bed as instructed Secondary Dressing ABD Pad, 8x10 Discharge Instruction: Apply over primary dressing as directed. Secured With Compression Wrap ThreePress (3 layer compression wrap) Discharge Instruction: Apply three layer compression as directed. Compression Stockings Add-Ons Wound #4 (Lower Leg) Wound Laterality: Right, Anterior Cleanser Soap and Water Discharge Instruction: May shower and wash wound with dial antibacterial soap and water prior to dressing change. Peri-Wound Care Sween Lotion (Moisturizing lotion) Discharge Instruction: Apply moisturizing lotion as directed Topical topical compounding antibiotics Discharge Instruction: apply directly to wound bed. Primary Dressing PolyMem Silver Non-Adhesive Dressing, 4.25x4.25 in Discharge Instruction: Apply to wound bed as instructed Secondary Dressing ABD Pad, 8x10 Discharge Instruction: Apply over  primary dressing as directed. Secured With Compression Wrap ThreePress (3 layer compression wrap) Discharge Instruction: Apply three layer compression as directed. Compression Stockings Add-Ons Electronic Signature(s) Signed: 04/21/2022 2:00:38 PM By: Brian Leon Signed: 04/21/2022 5:46:02 PM By: Deon Pilling Leon, Brian Leon Entered By: Brian Shan on 04/21/2022 10:03:35 -------------------------------------------------------------------------------- [place] Details Patient Name: Date of Service: Brian Leon, Brian Leon. 04/21/2022 8:15 A M Medical Record Number: [id number] Patient Account Number: [id number] Date of Birth/Sex: Treating Leon: 1946/08/03 (76 y.o. Brian Leon Primary Care Jordyn Hofacker: [name] Other Clinician: Referring Khyli Swaim: Treating Nohelani Benning/Extender: [name] Weeks in Treatment: 23 Active Inactive Necrotic Tissue Nursing Diagnoses: Impaired tissue integrity related to necrotic/devitalized tissue Knowledge deficit related to management of necrotic/devitalized tissue Goals: Necrotic/devitalized tissue will be minimized in the wound bed Date Initiated: 02/03/2022 Target Resolution Date: 05/20/2022 Goal Status: Active Patient/caregiver will verbalize understanding of reason and process for debridement of necrotic tissue Date Initiated: 02/03/2022 Date Inactivated: 03/24/2022 Target Resolution Date: 04/01/2022 Goal Status: Met Interventions: Assess patient pain level pre-, during and post procedure and prior to discharge Provide education on necrotic tissue and debridement process Treatment Activities: Apply topical anesthetic as ordered : 02/03/2022 Excisional debridement : 02/03/2022 Notes: Electronic Signature(s) Signed: 04/21/2022 5:46:02 PM By: Deon Pilling Leon, Brian Leon Entered By: Deon Pilling on 04/21/2022 08:33:38 -------------------------------------------------------------------------------- Pain Assessment  Details Patient Name: Date of Service: Brian Leon, Brian Leon. 04/21/2022 8:15 A  M Medical Record Number: [id number] Patient Account Number: [id number] Date of Birth/Sex: Treating Leon: Apr 23, 1946 (76 y.o. Brian Leon Primary Care Amiyrah Lamere: [name] Other Clinician: Referring Kumari Sculley: Treating Mareli Antunes/Extender: [name] Weeks in Treatment: 23 Active Problems Location of Pain Severity and Description of Pain Patient Has Paino No Site Locations Rate the pain. Current Pain Level: 0 Pain Management and Medication Current Pain Management: Medication: No Cold Application: No Rest: No Massage: No Activity: No T.E.N.S.: No Heat Application: No Leg drop or elevation: No Is the Current Pain Management Adequate: Adequate How does your wound impact your activities of daily livingo Sleep: No Bathing: No Appetite: No Relationship With Others: No Bladder Continence: No Emotions: No Bowel Continence: No Work: No Toileting: No Drive: No Dressing: No Hobbies: No Engineer, maintenance) Signed: 04/21/2022 5:46:02 PM By: Deon Pilling Leon, Brian Leon Entered By: Deon Pilling on 04/21/2022 08:30:46 -------------------------------------------------------------------------------- Patient/Caregiver Education Details Patient Name: Date of Service: Brian Leon, Brian Leon. 7/20/2023andnbsp8:15 A M Medical Record Number: [id number] Patient Account Number: [id number] Date of Birth/Gender: Treating Leon: 1946-02-24 (76 y.o. Brian Leon Primary Care Physician: [name] Other Clinician: Referring Physician: Treating Physician/Extender: [name] Weeks in Treatment: 78 Education Assessment Education Provided To: Patient Education Topics Provided Wound Debridement: Handouts: Wound Debridement Methods: Explain/Verbal Responses: State content correctly Electronic Signature(s) Signed: 04/21/2022 5:46:02 PM By: Deon Pilling Leon, Brian Leon Entered By:  Deon Pilling on 04/21/2022 08:33:48 -------------------------------------------------------------------------------- Wound Assessment Details Patient Name: Date of Service: Brian Leon, Brian Leon. 04/21/2022 8:15 A M Medical Record Number: [id number] Patient Account Number: [id number] Date of Birth/Sex: Treating Leon: 06-29-1946 (76 y.o. Brian Leon Primary Care Aemon Koeller: [name] Other Clinician: Referring Quinto Tippy: Treating Quantisha Marsicano/Extender: [name] Weeks in Treatment: 23 Wound Status Wound Number: 2 Primary Etiology: Abrasion Wound Location: Left, Anterior Lower Leg Wound Status: Open Wounding Event: Trauma Comorbid History: Asthma, Hypertension, Hepatitis A, Rheumatoid Arthritis Date Acquired: 01/04/2022 Weeks Of Treatment: 11 Clustered Wound: No Photos Wound Measurements Length: (cm) 0.8 Width: (cm) 1 Depth: (cm) 0.1 Area: (cm) 0.628 Volume: (cm) 0.063 % Reduction in Area: -48.1% % Reduction in Volume: -50% Epithelialization: Medium (34-66%) Tunneling: No Undermining: No Wound Description Classification: Full Thickness Without Exposed Support Structures Wound Margin: Distinct, outline attached Exudate Amount: Medium Exudate Type: Serosanguineous Exudate Color: red, brown Foul Odor After Cleansing: No Slough/Fibrino Yes Wound Bed Granulation Amount: Large (67-100%) Exposed Structure Granulation Quality: Pink Fascia Exposed: No Necrotic Amount: Small (1-33%) Fat Layer (Subcutaneous Tissue) Exposed: Yes Necrotic Quality: Adherent Slough Tendon Exposed: No Muscle Exposed: No Joint Exposed: No Bone Exposed: No Treatment Notes Wound #2 (Lower Leg) Wound Laterality: Left, Anterior Cleanser Soap and Water Discharge Instruction: May shower and wash wound with dial antibacterial soap and water prior to dressing change. Peri-Wound Care Sween Lotion (Moisturizing lotion) Discharge Instruction: Apply moisturizing lotion as  directed Topical topical compounding antibiotics Discharge Instruction: apply directly to wound bed. Primary Dressing PolyMem Silver Non-Adhesive Dressing, 4.25x4.25 in Discharge Instruction: Apply to wound bed as instructed Secondary Dressing ABD Pad, 8x10 Discharge Instruction: Apply over primary dressing as directed. Secured With Compression Wrap ThreePress (3 layer compression wrap) Discharge Instruction: Apply three layer compression as directed. Compression Stockings Add-Ons Electronic Signature(s) Signed: 04/21/2022 5:46:02 PM By: Deon Pilling Leon, Brian Leon Entered By: Deon Pilling on 04/21/2022 08:32:00 -------------------------------------------------------------------------------- Wound Assessment Details Patient Name: Date of Service: Brian Leon, Brian Leon. 04/21/2022 8:15 A M Medical Record Number:  [id number] Patient Account Number: [id number] Date of Birth/Sex: Treating Leon: [birth year] (76 y.o. Brian Leon Primary Care Agnieszka Newhouse: [name] Other Clinician: Referring Edison Nicholson: Treating Armonii Sieh/Extender: [name] Weeks in Treatment: 23 Wound Status Wound Number: 3 Primary Etiology: Abrasion Wound Location: Right, Medial Lower Leg Wound Status: Open Wounding Event: Trauma Comorbid History: Asthma, Hypertension, Hepatitis A, Rheumatoid Arthritis Date Acquired: 01/24/2022 Weeks Of Treatment: 11 Clustered Wound: No Photos Wound Measurements Length: (cm) 1 Width: (cm) 0.8 Depth: (cm) 0.1 Area: (cm) 0.628 Volume: (cm) 0.063 % Reduction in Area: 94.9% % Reduction in Volume: 94.9% Epithelialization: Medium (34-66%) Tunneling: No Undermining: No Wound Description Classification: Full Thickness Without Exposed Support Structures Wound Margin: Distinct, outline attached Exudate Amount: Medium Exudate Type: Serosanguineous Exudate Color: red, brown Foul Odor After Cleansing: No Slough/Fibrino No Wound Bed Granulation Amount: Large  (67-100%) Exposed Structure Granulation Quality: Red, Pink, Friable Fascia Exposed: No Necrotic Amount: None Present (0%) Fat Layer (Subcutaneous Tissue) Exposed: Yes Tendon Exposed: No Muscle Exposed: No Joint Exposed: No Bone Exposed: No Treatment Notes Wound #3 (Lower Leg) Wound Laterality: Right, Medial Cleanser Soap and Water Discharge Instruction: May shower and wash wound with dial antibacterial soap and water prior to dressing change. Peri-Wound Care Sween Lotion (Moisturizing lotion) Discharge Instruction: Apply moisturizing lotion as directed Topical topical compounding antibiotics Discharge Instruction: apply directly to wound bed. Primary Dressing PolyMem Silver Non-Adhesive Dressing, 4.25x4.25 in Discharge Instruction: Apply to wound bed as instructed Secondary Dressing ABD Pad, 8x10 Discharge Instruction: Apply over primary dressing as directed. Secured With Compression Wrap ThreePress (3 layer compression wrap) Discharge Instruction: Apply three layer compression as directed. Compression Stockings Add-Ons Electronic Signature(s) Signed: 04/21/2022 5:46:02 PM By: Deon Pilling Leon, Brian Leon Entered By: Deon Pilling on 04/21/2022 08:32:23 -------------------------------------------------------------------------------- Wound Assessment Details Patient Name: Date of Service: Brian Leon, Brian Leon. 04/21/2022 8:15 A M Medical Record Number: [id number] Patient Account Number: [id number] Date of Birth/Sex: Treating Leon: [birth year] (76 y.o. Brian Leon Primary Care Timika Muench: [name] Other Clinician: Referring Whalen Trompeter: Treating Charlii Yost/Extender: [name] Weeks in Treatment: 23 Wound Status Wound Number: 4 Primary Etiology: Abrasion Wound Location: Right, Anterior Lower Leg Wound Status: Open Wounding Event: Trauma Comorbid History: Asthma, Hypertension, Hepatitis A, Rheumatoid Arthritis Date Acquired: 01/04/2022 Weeks Of Treatment:  11 Clustered Wound: No Photos Wound Measurements Length: (cm) 1.3 Width: (cm) 1.5 Depth: (cm) 0.1 Area: (cm) 1.532 Volume: (cm) 0.153 % Reduction in Area: -364.2% % Reduction in Volume: -363.6% Epithelialization: Small (1-33%) Tunneling: No Undermining: No Wound Description Classification: Full Thickness Without Exposed Support Structures Wound Margin: Distinct, outline attached Exudate Amount: Medium Exudate Type: Serosanguineous Exudate Color: red, brown Foul Odor After Cleansing: No Slough/Fibrino Yes Wound Bed Granulation Amount: Large (67-100%) Exposed Structure Granulation Quality: Pink Fascia Exposed: No Necrotic Amount: Small (1-33%) Fat Layer (Subcutaneous Tissue) Exposed: Yes Necrotic Quality: Adherent Slough Tendon Exposed: No Muscle Exposed: No Joint Exposed: No Bone Exposed: No Treatment Notes Wound #4 (Lower Leg) Wound Laterality: Right, Anterior Cleanser Soap and Water Discharge Instruction: May shower and wash wound with dial antibacterial soap and water prior to dressing change. Peri-Wound Care Sween Lotion (Moisturizing lotion) Discharge Instruction: Apply moisturizing lotion as directed Topical topical compounding antibiotics Discharge Instruction: apply directly to wound bed. Primary Dressing PolyMem Silver Non-Adhesive Dressing, 4.25x4.25 in Discharge Instruction: Apply to wound bed as instructed Secondary Dressing ABD Pad, 8x10 Discharge Instruction: Apply over primary dressing as directed. Secured With Compression Wrap ThreePress (3 layer compression wrap)  Discharge Instruction: Apply three layer compression as directed. Compression Stockings Add-Ons Electronic Signature(s) Signed: 04/21/2022 5:46:02 PM By: Deon Pilling Leon, Brian Leon Entered By: Deon Pilling on 04/21/2022 08:32:51 -------------------------------------------------------------------------------- Wound Assessment Details Patient Name: Date of Service: Brian Leon, Brian Leon.  04/21/2022 8:15 A M Medical Record Number: [id number] Patient Account Number: [id number] Date of Birth/Sex: Treating Leon: 12-11-1945 (76 y.o. Brian Leon Primary Care Maribel Luis: [name] Other Clinician: Referring Carlee Vonderhaar: Treating Bowie Delia/Extender: [name] Weeks in Treatment: 23 Wound Status Wound Number: 6 Primary Etiology: Skin Tear Wound Location: Right, Lateral Lower Leg Wound Status: Healed - Epithelialized Wounding Event: Laceration Comorbid History: Asthma, Hypertension, Hepatitis A, Rheumatoid Arthritis Date Acquired: 03/25/2022 Weeks Of Treatment: 3 Clustered Wound: No Photos Wound Measurements Length: (cm) Width: (cm) Depth: (cm) Area: (cm) Volume: (cm) 0 % Reduction in Area: 100% 0 % Reduction in Volume: 100% 0 Epithelialization: Large (67-100%) 0 Tunneling: No 0 Undermining: No Wound Description Classification: Full Thickness Without Exposed Support Structures Wound Margin: Distinct, outline attached Exudate Amount: None Present Foul Odor After Cleansing: No Slough/Fibrino No Wound Bed Granulation Amount: None Present (0%) Exposed Structure Necrotic Amount: None Present (0%) Fascia Exposed: No Fat Layer (Subcutaneous Tissue) Exposed: No Tendon Exposed: No Muscle Exposed: No Joint Exposed: No Bone Exposed: No Electronic Signature(s) Signed: 04/21/2022 5:46:02 PM By: Deon Pilling Leon, Brian Leon Entered By: Deon Pilling on 04/21/2022 08:33:12 -------------------------------------------------------------------------------- Vitals Details Patient Name: Date of Service: Brian Leon, Brian Leon. 04/21/2022 8:15 A M Medical Record Number: [id number] Patient Account Number: [id number] Date of Birth/Sex: Treating Leon: [birth year] (76 y.o. Brian Leon Primary Care Zachry Hopfensperger: [name] Other Clinician: Referring Faryn Sieg: Treating Kainon Varady/Extender: [name] Weeks in Treatment: 23 Vital Signs Time Taken:  08:20 Temperature (F): 97.8 Height (in): 68 Pulse (bpm): 67 Weight (lbs): 140 Respiratory Rate (breaths/min): 16 Body Mass Index (BMI): 21.3 Blood Pressure (mmHg): 136/76 Reference Range: 80 - 120 mg / dl Electronic Signature(s) Signed: 04/21/2022 5:46:02 PM By: Deon Pilling Leon, Brian Leon Entered By: Deon Pilling on 04/21/2022 08:30:38

## 2022-04-28 ENCOUNTER — Encounter (HOSPITAL_BASED_OUTPATIENT_CLINIC_OR_DEPARTMENT_OTHER): Payer: Medicare Other | Admitting: Internal Medicine

## 2022-04-28 ENCOUNTER — Encounter: Payer: Self-pay | Admitting: Rheumatology

## 2022-04-28 ENCOUNTER — Ambulatory Visit (INDEPENDENT_AMBULATORY_CARE_PROVIDER_SITE_OTHER): Payer: Medicare Other | Admitting: Rheumatology

## 2022-04-28 VITALS — BP 133/81 | HR 59 | Ht 69.5 in | Wt 145.2 lb

## 2022-04-28 DIAGNOSIS — R911 Solitary pulmonary nodule: Secondary | ICD-10-CM

## 2022-04-28 DIAGNOSIS — I10 Essential (primary) hypertension: Secondary | ICD-10-CM

## 2022-04-28 DIAGNOSIS — Z970 Presence of artificial eye: Secondary | ICD-10-CM

## 2022-04-28 DIAGNOSIS — E559 Vitamin D deficiency, unspecified: Secondary | ICD-10-CM

## 2022-04-28 DIAGNOSIS — L97822 Non-pressure chronic ulcer of other part of left lower leg with fat layer exposed: Secondary | ICD-10-CM | POA: Diagnosis not present

## 2022-04-28 DIAGNOSIS — M810 Age-related osteoporosis without current pathological fracture: Secondary | ICD-10-CM | POA: Diagnosis not present

## 2022-04-28 DIAGNOSIS — Z9889 Other specified postprocedural states: Secondary | ICD-10-CM

## 2022-04-28 DIAGNOSIS — M19041 Primary osteoarthritis, right hand: Secondary | ICD-10-CM

## 2022-04-28 DIAGNOSIS — M353 Polymyalgia rheumatica: Secondary | ICD-10-CM

## 2022-04-28 DIAGNOSIS — Z7952 Long term (current) use of systemic steroids: Secondary | ICD-10-CM

## 2022-04-28 DIAGNOSIS — Z872 Personal history of diseases of the skin and subcutaneous tissue: Secondary | ICD-10-CM

## 2022-04-28 DIAGNOSIS — Z862 Personal history of diseases of the blood and blood-forming organs and certain disorders involving the immune mechanism: Secondary | ICD-10-CM

## 2022-04-28 DIAGNOSIS — Z79899 Other long term (current) drug therapy: Secondary | ICD-10-CM

## 2022-04-28 DIAGNOSIS — L97912 Non-pressure chronic ulcer of unspecified part of right lower leg with fat layer exposed: Secondary | ICD-10-CM

## 2022-04-28 DIAGNOSIS — N132 Hydronephrosis with renal and ureteral calculous obstruction: Secondary | ICD-10-CM

## 2022-04-28 DIAGNOSIS — D75839 Thrombocytosis, unspecified: Secondary | ICD-10-CM

## 2022-04-28 DIAGNOSIS — A498 Other bacterial infections of unspecified site: Secondary | ICD-10-CM

## 2022-04-28 DIAGNOSIS — M5030 Other cervical disc degeneration, unspecified cervical region: Secondary | ICD-10-CM

## 2022-04-28 DIAGNOSIS — M19042 Primary osteoarthritis, left hand: Secondary | ICD-10-CM

## 2022-04-28 DIAGNOSIS — Z8719 Personal history of other diseases of the digestive system: Secondary | ICD-10-CM

## 2022-04-28 NOTE — Patient Instructions (Signed)
Vaccines You are taking a medication(s) that can suppress your immune system.  The following immunizations are recommended: Flu annually Covid-19  Td/Tdap (tetanus, diphtheria, pertussis) every 10 years Pneumonia (Prevnar 15 then Pneumovax 23 at least 1 year apart.  Alternatively, can take Prevnar 20 without needing additional dose) Shingrix: 2 doses from 4 weeks to 6 months apart  Please check with your PCP to make sure you are up to date.  

## 2022-04-28 NOTE — Progress Notes (Signed)
Brian Leon ([id number]) Visit Report for 04/28/2022 Arrival Information Details Patient Name: Date of Service: Brian Leon, [name] 04/28/2022 8:15 A M Medical Record Number: [id number] Patient Account Number: [id number] Date of Birth/Sex: Treating RN: 1945-11-19 (76 y.o. Brian Leon, [name] Primary Care Kaitlyn Skowron: [name] Other Clinician: Referring Arissa Fagin: Treating Keiana Tavella/Extender: [name] Weeks in Treatment: 24 Visit Information History Since Last Visit Added or deleted any medications: No Patient Arrived: Ambulatory Any new allergies or adverse reactions: No Arrival Time: 08:24 Had a fall or experienced change in No Accompanied By: self activities of daily living that may affect Transfer Assistance: None risk of falls: Patient Identification Verified: Yes Signs or symptoms of abuse/neglect since last visito No Secondary Verification Process Completed: Yes Hospitalized since last visit: No Patient Requires Transmission-Based Precautions: No Implantable device outside of the clinic excluding No Patient Has Alerts: Yes cellular tissue based products placed in the center Patient Alerts: 11/02/21 L ABI: 1.37 since last visit: 11/02/2021 L TBI 0.77 Has Dressing in Place as Prescribed: Yes 11/02/2021 R TBI 0.69 Has Compression in Place as Prescribed: Yes 11/02/2021 R ABI 1.3 Pain Present Now: No Electronic Signature(s) Signed: 04/28/2022 4:23:35 PM By: Deon Pilling RN, BSN Entered By: Deon Pilling on 04/28/2022 08:24:16 -------------------------------------------------------------------------------- Compression Therapy Details Patient Name: Date of Service: Brian Leon, RO NN P. 04/28/2022 8:15 A M Medical Record Number: [id number] Patient Account Number: [id number] Date of Birth/Sex: Treating RN: 1945/11/18 (76 y.o. Brian Leon Primary Care Naylee Frankowski: [name] Other Clinician: Referring Jaice Digioia: Treating Finlay Godbee/Extender: [name] Weeks in Treatment: 24 Compression Therapy Performed for Wound Assessment: Wound #2 Left,Anterior Lower Leg Performed By: Clinician Deon Pilling, RN Compression Type: Three Layer Post Procedure Diagnosis Same as Pre-procedure Electronic Signature(s) Signed: 04/28/2022 4:23:35 PM By: Deon Pilling RN, BSN Entered By: Deon Pilling on 04/28/2022 08:54:29 -------------------------------------------------------------------------------- Compression Therapy Details Patient Name: Date of Service: Brian Leon, RO NN P. 04/28/2022 8:15 A M Medical Record Number: [id number] Patient Account Number: [id number] Date of Birth/Sex: Treating RN: Apr 11, 1946 (76 y.o. Brian Leon Primary Care Kynadee Dam: [name] Other Clinician: Referring Peng Thorstenson: Treating Tanee Henery/Extender: [name] Weeks in Treatment: 24 Compression Therapy Performed for Wound Assessment: Wound #4 Right,Anterior Lower Leg Performed By: Clinician Deon Pilling, RN Compression Type: Three Layer Post Procedure Diagnosis Same as Pre-procedure Electronic Signature(s) Signed: 04/28/2022 4:23:35 PM By: Deon Pilling RN, BSN Entered By: Deon Pilling on 04/28/2022 08:54:29 -------------------------------------------------------------------------------- Encounter Discharge Information Details Patient Name: Date of Service: Brian Leon, RO NN P. 04/28/2022 8:15 A M Medical Record Number: [id number] Patient Account Number: [id number] Date of Birth/Sex: Treating RN: Feb 13, 1946 (76 y.o. Brian Leon Primary Care Vegas Coffin: [name] Other Clinician: Referring Ernestene Coover: Treating Nasrin Lanzo/Extender: [name] Weeks in Treatment: 24 Encounter Discharge Information Items Post Procedure Vitals Discharge Condition: Stable Temperature (F): 98 Ambulatory Status: Ambulatory Pulse (bpm): 66 Discharge Destination: Home Respiratory Rate (breaths/min):  20 Transportation: Private Auto Blood Pressure (mmHg): 152/85 Accompanied By: self Schedule Follow-up Appointment: Yes Clinical Summary of Care: Electronic Signature(s) Signed: 04/28/2022 4:23:35 PM By: Deon Pilling RN, BSN Entered By: Deon Pilling on 04/28/2022 09:00:46 -------------------------------------------------------------------------------- Lower Extremity Assessment Details Patient Name: Date of Service: Brian Leon, RO NN P. 04/28/2022 8:15 A M Medical Record Number: [id number] Patient Account Number: [id number] Date of Birth/Sex: Treating RN: August 06, 1946 (76 y.o. Brian Leon Primary Care Corrinne Benegas: [name] Other Clinician: Referring  [name]: Treating Arshad Oberholzer/Extender: [name] Weeks in Treatment: 24 Edema Assessment Assessed: [Left: Yes] [Right: Yes] Edema: [Left: No] [Right: No] Calf Left: Right: Point of Measurement: 37 cm From Medial Instep 28 cm 28 cm Ankle Left: Right: Point of Measurement: 12 cm From Medial Instep 18 cm 19 cm Vascular Assessment Pulses: Dorsalis Pedis Palpable: [Left:Yes] [Right:Yes] Electronic Signature(s) Signed: 04/28/2022 4:23:35 PM By: Deon Pilling RN, BSN Entered By: Deon Pilling on 04/28/2022 08:27:48 -------------------------------------------------------------------------------- Multi Wound Chart Details Patient Name: Date of Service: Brian Leon, RO NN P. 04/28/2022 8:15 A M Medical Record Number: [id number] Patient Account Number: [id number] Date of Birth/Sex: Treating RN: [birth year] (76 y.o. Brian Leon, [name] Primary Care [name]: [name] Other Clinician: Referring Honestie Kulik: Treating Louella Medaglia/Extender: [name] Weeks in Treatment: 24 Vital Signs Height(in): 68 Pulse(bpm): 76 Weight(lbs): 140 Blood Pressure(mmHg): 152/85 Body Mass Index(BMI): 21.3 Temperature(F): 98 Respiratory Rate(breaths/min): 20 Photos: Left, Anterior Lower Leg Right, Medial Lower Leg  Right, Anterior Lower Leg Wound Location: Trauma Trauma Trauma Wounding Event: Abrasion Abrasion Abrasion Primary Etiology: Asthma, Hypertension, Hepatitis A, Asthma, Hypertension, Hepatitis A, Asthma, Hypertension, Hepatitis A, Comorbid History: Rheumatoid Arthritis Rheumatoid Arthritis Rheumatoid Arthritis 01/04/2022 01/24/2022 01/04/2022 Date Acquired: '12 12 12 ' Weeks of Treatment: Open Healed - Epithelialized Open Wound Status: No No No Wound Recurrence: 1.1x1.1x0.1 0x0x0 1.3x1x0.1 Measurements L x W x D (cm) 0.95 0 1.021 A (cm) : rea 0.095 0 0.102 Volume (cm) : -124.10% 100.00% -209.40% % Reduction in Area: -126.20% 100.00% -209.10% % Reduction in Volume: Full Thickness Without Exposed Full Thickness Without Exposed Full Thickness Without Exposed Classification: Support Structures Support Structures Support Structures Medium None Present Medium Exudate Amount: Serosanguineous N/A Serosanguineous Exudate Type: red, brown N/A red, brown Exudate Color: Distinct, outline attached Distinct, outline attached Distinct, outline attached Wound Margin: Large (67-100%) None Present (0%) Large (67-100%) Granulation Amount: Pink N/A Pink Granulation Quality: Small (1-33%) None Present (0%) Small (1-33%) Necrotic Amount: Fat Layer (Subcutaneous Tissue): Yes Fascia: No Fat Layer (Subcutaneous Tissue): Yes Exposed Structures: Fascia: No Fat Layer (Subcutaneous Tissue): No Fascia: No Tendon: No Tendon: No Tendon: No Muscle: No Muscle: No Muscle: No Joint: No Joint: No Joint: No Bone: No Bone: No Bone: No None Large (67-100%) Medium (34-66%) Epithelialization: Debridement - Excisional N/A Debridement - Excisional Debridement: Pre-procedure Verification/Time Out 08:50 N/A 08:50 Taken: Lidocaine 5% topical ointment N/A Lidocaine 5% topical ointment Pain Control: Subcutaneous, Slough N/A Subcutaneous, Slough Tissue Debrided: Skin/Subcutaneous Tissue N/A  Skin/Subcutaneous Tissue Level: 1.21 N/A 1.3 Debridement A (sq cm): rea Curette N/A Curette Instrument: Minimum N/A Minimum Bleeding: Pressure N/A Pressure Hemostasis A chieved: 0 N/A 0 Procedural Pain: 0 N/A 0 Post Procedural Pain: Procedure was tolerated well N/A Procedure was tolerated well Debridement Treatment Response: 1.1x1.1x0.1 N/A 1.3x1x0.1 Post Debridement Measurements L x W x D (cm) 0.095 N/A 0.102 Post Debridement Volume: (cm) Compression Therapy N/A Compression Therapy Procedures Performed: Debridement Debridement Treatment Notes Wound #2 (Lower Leg) Wound Laterality: Left, Anterior Cleanser Soap and Water Discharge Instruction: May shower and wash wound with dial antibacterial soap and water prior to dressing change. Peri-Wound Care Sween Lotion (Moisturizing lotion) Discharge Instruction: Apply moisturizing lotion as directed Topical Primary Dressing FIBRACOL Plus Dressing, 2x2 in (collagen) Discharge Instruction: Moisten collagen with saline or hydrogel Secondary Dressing ABD Pad, 8x10 Discharge Instruction: Apply over primary dressing as directed. Secured With Compression Wrap ThreePress (3 layer compression wrap) Discharge Instruction: Apply three layer compression as directed. Compression Stockings Add-Ons Wound #4 (Lower  Leg) Wound Laterality: Right, Anterior Cleanser Soap and Water Discharge Instruction: May shower and wash wound with dial antibacterial soap and water prior to dressing change. Peri-Wound Care Sween Lotion (Moisturizing lotion) Discharge Instruction: Apply moisturizing lotion as directed Topical Primary Dressing FIBRACOL Plus Dressing, 2x2 in (collagen) Discharge Instruction: Moisten collagen with saline or hydrogel Secondary Dressing ABD Pad, 8x10 Discharge Instruction: Apply over primary dressing as directed. Secured With Compression Wrap ThreePress (3 layer compression wrap) Discharge Instruction: Apply three  layer compression as directed. Compression Stockings Add-Ons Electronic Signature(s) Signed: 04/28/2022 9:33:00 AM By: Kalman Shan DO Signed: 04/28/2022 4:23:35 PM By: Deon Pilling RN, BSN Entered By: Kalman Shan on 04/28/2022 09:17:46 -------------------------------------------------------------------------------- Multi-Disciplinary Care Plan Details Patient Name: Date of Service: Brian Leon, RO NN P. 04/28/2022 8:15 A M Medical Record Number: [id number] Patient Account Number: [id number] Date of Birth/Sex: Treating RN: 15-Sep-1946 (76 y.o. Brian Leon Primary Care Amalia Edgecombe: [name] Other Clinician: Referring Daysy Santini: Treating Savera Donson/Extender: [name] Weeks in Treatment: 24 Active Inactive Necrotic Tissue Nursing Diagnoses: Impaired tissue integrity related to necrotic/devitalized tissue Knowledge deficit related to management of necrotic/devitalized tissue Goals: Necrotic/devitalized tissue will be minimized in the wound bed Date Initiated: 02/03/2022 Target Resolution Date: 05/20/2022 Goal Status: Active Patient/caregiver will verbalize understanding of reason and process for debridement of necrotic tissue Date Initiated: 02/03/2022 Date Inactivated: 03/24/2022 Target Resolution Date: 04/01/2022 Goal Status: Met Interventions: Assess patient pain level pre-, during and post procedure and prior to discharge Provide education on necrotic tissue and debridement process Treatment Activities: Apply topical anesthetic as ordered : 02/03/2022 Excisional debridement : 02/03/2022 Notes: Electronic Signature(s) Signed: 04/28/2022 4:23:35 PM By: Deon Pilling RN, BSN Entered By: Deon Pilling on 04/28/2022 08:39:28 -------------------------------------------------------------------------------- Pain Assessment Details Patient Name: Date of Service: Brian Leon, RO NN P. 04/28/2022 8:15 A M Medical Record Number: [id number] Patient Account Number:  [id number] Date of Birth/Sex: Treating RN: September 19, 1946 (76 y.o. Brian Leon Primary Care Sadira Standard: [name] Other Clinician: Referring Mirabella Hilario: Treating Rhylei Mcquaig/Extender: [name] Weeks in Treatment: 24 Active Problems Location of Pain Severity and Description of Pain Patient Has Paino No Site Locations Rate the pain. Current Pain Level: 0 Pain Management and Medication Current Pain Management: Medication: No Cold Application: No Rest: No Massage: No Activity: No T.E.N.S.: No Heat Application: No Leg drop or elevation: No Is the Current Pain Management Adequate: Adequate How does your wound impact your activities of daily livingo Sleep: No Bathing: No Appetite: No Relationship With Others: No Bladder Continence: No Emotions: No Bowel Continence: No Work: No Toileting: No Drive: No Dressing: No Hobbies: No Engineer, maintenance) Signed: 04/28/2022 4:23:35 PM By: Deon Pilling RN, BSN Entered By: Deon Pilling on 04/28/2022 08:24:32 -------------------------------------------------------------------------------- Patient/Caregiver Education Details Patient Name: Date of Service: Brian Leon, RO NN P. 7/27/2023andnbsp8:15 [place] Record Number: [id number] Patient Account Number: [id number] Date of Birth/Gender: Treating RN: 05/08/1946 (76 y.o. Brian Leon Primary Care Physician: [name] Other Clinician: Referring Physician: Treating Physician/Extender: [name] Weeks in Treatment: 58 Education Assessment Education Provided To: Patient Education Topics Provided Wound Debridement: Handouts: Wound Debridement Methods: Explain/Verbal Responses: Reinforcements needed Electronic Signature(s) Signed: 04/28/2022 4:23:35 PM By: Deon Pilling RN, BSN Entered By: Deon Pilling on 04/28/2022 08:39:44 -------------------------------------------------------------------------------- Wound Assessment  Details Patient Name: Date of Service: Brian Leon, RO NN P. 04/28/2022 8:15 A M Medical Record Number: [id number] Patient Account Number: [id number] Date of Birth/Sex: Treating RN: 1946-04-26 (  76 y.o. [name] Primary Care Bethzaida Boord: [name] Other Clinician: Referring Avrey Flanagin: Treating Sherree Shankman/Extender: [name] Weeks in Treatment: 24 Wound Status Wound Number: 2 Primary Etiology: Abrasion Wound Location: Left, Anterior Lower Leg Wound Status: Open Wounding Event: Trauma Comorbid History: Asthma, Hypertension, Hepatitis A, Rheumatoid Arthritis Date Acquired: 01/04/2022 Weeks Of Treatment: 12 Clustered Wound: No Photos Wound Measurements Length: (cm) 1.1 Width: (cm) 1.1 Depth: (cm) 0.1 Area: (cm) 0.95 Volume: (cm) 0.095 % Reduction in Area: -124.1% % Reduction in Volume: -126.2% Epithelialization: None Tunneling: No Undermining: No Wound Description Classification: Full Thickness Without Exposed Support Structures Wound Margin: Distinct, outline attached Exudate Amount: Medium Exudate Type: Serosanguineous Exudate Color: red, brown Foul Odor After Cleansing: No Slough/Fibrino Yes Wound Bed Granulation Amount: Large (67-100%) Exposed Structure Granulation Quality: Pink Fascia Exposed: No Necrotic Amount: Small (1-33%) Fat Layer (Subcutaneous Tissue) Exposed: Yes Necrotic Quality: Adherent Slough Tendon Exposed: No Muscle Exposed: No Joint Exposed: No Bone Exposed: No Treatment Notes Wound #2 (Lower Leg) Wound Laterality: Left, Anterior Cleanser Soap and Water Discharge Instruction: May shower and wash wound with dial antibacterial soap and water prior to dressing change. Peri-Wound Care Sween Lotion (Moisturizing lotion) Discharge Instruction: Apply moisturizing lotion as directed Topical Primary Dressing FIBRACOL Plus Dressing, 2x2 in (collagen) Discharge Instruction: Moisten collagen with saline or hydrogel Secondary  Dressing ABD Pad, 8x10 Discharge Instruction: Apply over primary dressing as directed. Secured With Compression Wrap ThreePress (3 layer compression wrap) Discharge Instruction: Apply three layer compression as directed. Compression Stockings Add-Ons Electronic Signature(s) Signed: 04/28/2022 4:20:43 PM By: Sharyn Creamer RN, BSN Entered By: Sharyn Creamer on 04/28/2022 08:28:20 -------------------------------------------------------------------------------- Wound Assessment Details Patient Name: Date of Service: Brian Leon, RO NN P. 04/28/2022 8:15 A M Medical Record Number: [id number] Patient Account Number: [id number] Date of Birth/Sex: Treating RN: [birth year] (76 y.o. Brian Leon Primary Care Lindley Stachnik: [name] Other Clinician: Referring Oak Dorey: Treating Kamea Dacosta/Extender: [name] Weeks in Treatment: 24 Wound Status Wound Number: 3 Primary Etiology: Abrasion Wound Location: Right, Medial Lower Leg Wound Status: Healed - Epithelialized Wounding Event: Trauma Comorbid History: Asthma, Hypertension, Hepatitis A, Rheumatoid Arthritis Date Acquired: 01/24/2022 Weeks Of Treatment: 12 Clustered Wound: No Photos Wound Measurements Length: (cm) Width: (cm) Depth: (cm) Area: (cm) Volume: (cm) 0 % Reduction in Area: 100% 0 % Reduction in Volume: 100% 0 Epithelialization: Large (67-100%) 0 Tunneling: No 0 Undermining: No Wound Description Classification: Full Thickness Without Exposed Support Structures Wound Margin: Distinct, outline attached Exudate Amount: None Present Foul Odor After Cleansing: No Slough/Fibrino No Wound Bed Granulation Amount: None Present (0%) Exposed Structure Necrotic Amount: None Present (0%) Fascia Exposed: No Fat Layer (Subcutaneous Tissue) Exposed: No Tendon Exposed: No Muscle Exposed: No Joint Exposed: No Bone Exposed: No Electronic Signature(s) Signed: 04/28/2022 4:20:43 PM By: Sharyn Creamer RN,  BSN Signed: 04/28/2022 4:23:35 PM By: Deon Pilling RN, BSN Entered By: Sharyn Creamer on 04/28/2022 08:28:55 -------------------------------------------------------------------------------- Wound Assessment Details Patient Name: Date of Service: Brian Leon, RO NN P. 04/28/2022 8:15 A M Medical Record Number: [id number] Patient Account Number: [id number] Date of Birth/Sex: Treating RN: Feb 24, 1946 (76 y.o. Brian Leon Primary Care Marcellus Pulliam: [name] Other Clinician: Referring Thayne Cindric: Treating Kimber Fritts/Extender: [name] Weeks in Treatment: 24 Wound Status Wound Number: 4 Primary Etiology: Abrasion Wound Location: Right, Anterior Lower Leg Wound Status: Open Wounding Event: Trauma Comorbid History: Asthma, Hypertension, Hepatitis A, Rheumatoid Arthritis Date Acquired: 01/04/2022 Weeks Of Treatment: 12 Clustered Wound:  No Photos Wound Measurements Length: (cm) 1.3 Width: (cm) 1 Depth: (cm) 0.1 Area: (cm) 1.021 Volume: (cm) 0.102 % Reduction in Area: -209.4% % Reduction in Volume: -209.1% Epithelialization: Medium (34-66%) Tunneling: No Undermining: No Wound Description Classification: Full Thickness Without Exposed Support Structures Wound Margin: Distinct, outline attached Exudate Amount: Medium Exudate Type: Serosanguineous Exudate Color: red, brown Foul Odor After Cleansing: No Slough/Fibrino Yes Wound Bed Granulation Amount: Large (67-100%) Exposed Structure Granulation Quality: Pink Fascia Exposed: No Necrotic Amount: Small (1-33%) Fat Layer (Subcutaneous Tissue) Exposed: Yes Necrotic Quality: Adherent Slough Tendon Exposed: No Muscle Exposed: No Joint Exposed: No Bone Exposed: No Treatment Notes Wound #4 (Lower Leg) Wound Laterality: Right, Anterior Cleanser Soap and Water Discharge Instruction: May shower and wash wound with dial antibacterial soap and water prior to dressing change. Peri-Wound Care Sween Lotion  (Moisturizing lotion) Discharge Instruction: Apply moisturizing lotion as directed Topical Primary Dressing FIBRACOL Plus Dressing, 2x2 in (collagen) Discharge Instruction: Moisten collagen with saline or hydrogel Secondary Dressing ABD Pad, 8x10 Discharge Instruction: Apply over primary dressing as directed. Secured With Compression Wrap ThreePress (3 layer compression wrap) Discharge Instruction: Apply three layer compression as directed. Compression Stockings Add-Ons Electronic Signature(s) Signed: 04/28/2022 4:20:43 PM By: Sharyn Creamer RN, BSN Signed: 04/28/2022 4:23:35 PM By: Deon Pilling RN, BSN Entered By: Sharyn Creamer on 04/28/2022 08:29:26 -------------------------------------------------------------------------------- Vitals Details Patient Name: Date of Service: Brian Leon, RO NN P. 04/28/2022 8:15 A M Medical Record Number: [id number] Patient Account Number: [id number] Date of Birth/Sex: Treating RN: June 06, 1946 (76 y.o. Brian Leon Primary Care Karri Kallenbach: [name] Other Clinician: Referring Echo Propp: Treating Minal Stuller/Extender: [name] Weeks in Treatment: 24 Vital Signs Time Taken: 08:24 Temperature (F): 98 Height (in): 68 Pulse (bpm): 66 Weight (lbs): 140 Respiratory Rate (breaths/min): 20 Body Mass Index (BMI): 21.3 Blood Pressure (mmHg): 152/85 Reference Range: 80 - 120 mg / dl Electronic Signature(s) Signed: 04/28/2022 4:23:35 PM By: Deon Pilling RN, BSN Entered By: Deon Pilling on 04/28/2022 74:16:38

## 2022-04-28 NOTE — Telephone Encounter (Signed)
Patient finished antibiotics for C.diff. He has f/u with PCP on 05/03/22. They may repeat stool sample per patient. At this time, will forgo Prolia administration at [facility] today. Patient will reach out next week regarding what is discussed at PCP visit. Prolia has already been received from pharmacy and is in [place].  [name], PharmD, MPH, BCPS, CPP Clinical Pharmacist (Rheumatology and Pulmonology)

## 2022-04-28 NOTE — Progress Notes (Signed)
Brian Leon, Brian Leon ([id number]) Visit Report for 04/28/2022 Chief Complaint Document Details Patient Name: Date of Service: Brian Leon, [name] 04/28/2022 8:15 A M Medical Record Number: [id number] Patient Account Number: [id number] Date of Birth/Sex: Treating RN: 01-15-1946 (76 y.o. Brian Leon Primary Care Provider: [name] Other Clinician: Referring Provider: Treating Provider/Extender: [name] Weeks in Treatment: 24 Information Obtained from: Patient Chief Complaint 11/11/2021; Left lower extremity wound due to trauma 02/03/2022; bilateral lower extremity wounds due to trauma Electronic Signature(s) Signed: 04/28/2022 9:33:00 AM By: Kalman Shan DO Entered By: Kalman Shan on 04/28/2022 09:17:52 -------------------------------------------------------------------------------- Debridement Details Patient Name: Date of Service: Brian Leon, RO NN P. 04/28/2022 8:15 A M Medical Record Number: [id number] Patient Account Number: [id number] Date of Birth/Sex: Treating RN: [birth year] (76 y.o. Brian Leon, Brian Leon Primary Care Provider: [name] Other Clinician: Referring Provider: Treating Provider/Extender: [name] Weeks in Treatment: 24 Debridement Performed for Assessment: Wound #4 Right,Anterior Lower Leg Performed By: Physician Kalman Shan, DO Debridement Type: Debridement Level of Consciousness (Pre-procedure): Awake and Alert Pre-procedure Verification/Time Out Yes - 08:50 Taken: Start Time: 08:51 Pain Control: Lidocaine 5% topical ointment T Area Debrided (L x W): otal 1.3 (cm) x 1 (cm) = 1.3 (cm) Tissue and other material debrided: Viable, Non-Viable, Slough, Subcutaneous, Slough Level: Skin/Subcutaneous Tissue Debridement Description: Excisional Instrument: Curette Bleeding: Minimum Hemostasis Achieved: Pressure End Time: 08:56 Procedural Pain: 0 Post Procedural Pain: 0 Response to Treatment: Procedure was  tolerated well Level of Consciousness (Post- Awake and Alert procedure): Post Debridement Measurements of Total Wound Length: (cm) 1.3 Width: (cm) 1 Depth: (cm) 0.1 Volume: (cm) 0.102 Character of Wound/Ulcer Post Debridement: Improved Post Procedure Diagnosis Same as Pre-procedure Electronic Signature(s) Signed: 04/28/2022 9:33:00 AM By: Kalman Shan DO Signed: 04/28/2022 4:23:35 PM By: Deon Pilling RN, BSN Entered By: Deon Pilling on 04/28/2022 08:57:03 -------------------------------------------------------------------------------- Debridement Details Patient Name: Date of Service: Brian Leon, RO NN P. 04/28/2022 8:15 A M Medical Record Number: [id number] Patient Account Number: [id number] Date of Birth/Sex: Treating RN: [birth year] (76 y.o. Brian Leon, Brian Leon Primary Care Provider: [name] Other Clinician: Referring Provider: Treating Provider/Extender: [name] Weeks in Treatment: 24 Debridement Performed for Assessment: Wound #2 Left,Anterior Lower Leg Performed By: Physician Kalman Shan, DO Debridement Type: Debridement Level of Consciousness (Pre-procedure): Awake and Alert Pre-procedure Verification/Time Out Yes - 08:50 Taken: Start Time: 08:51 Pain Control: Lidocaine 5% topical ointment T Area Debrided (L x W): otal 1.1 (cm) x 1.1 (cm) = 1.21 (cm) Tissue and other material debrided: Viable, Non-Viable, Slough, Subcutaneous, Slough Level: Skin/Subcutaneous Tissue Debridement Description: Excisional Instrument: Curette Bleeding: Minimum Hemostasis Achieved: Pressure End Time: 08:56 Procedural Pain: 0 Post Procedural Pain: 0 Response to Treatment: Procedure was tolerated well Level of Consciousness (Post- Awake and Alert procedure): Post Debridement Measurements of Total Wound Length: (cm) 1.1 Width: (cm) 1.1 Depth: (cm) 0.1 Volume: (cm) 0.095 Character of Wound/Ulcer Post Debridement: Improved Post Procedure  Diagnosis Same as Pre-procedure Electronic Signature(s) Signed: 04/28/2022 9:33:00 AM By: Kalman Shan DO Signed: 04/28/2022 4:23:35 PM By: Deon Pilling RN, BSN Entered By: Deon Pilling on 04/28/2022 08:57:48 -------------------------------------------------------------------------------- HPI Details Patient Name: Date of Service: Brian Leon, RO NN P. 04/28/2022 8:15 A M Medical Record Number: [id number] Patient Account Number: [id number] Date of Birth/Sex: Treating RN: 01-31-1946 (76 y.o. Brian Leon Primary Care Provider: Other Clinician: [name] Referring Provider: Treating Provider/Extender: Bebe Shaggy, JA CKY Weeks in  Treatment: 24 History of Present Illness HPI Description: Admission 11/11/2021 Brian Leon is a 76 year old male with a past medical history of polymyalgia rheumatica that presents to the clinic for a 47-monthhistory of nonhealing wound to the left lower extremity. He states that on Halloween he hit an object and created a wound that has not healed. He states he has seen dermatology and his primary care physician for this issue. On one occasion he had an UHaematologist He has also used Xeroform. It does not sound like he uses any kind of wound dressing consistently. He currently reports chronic pain to the wound site. He denies systemic signs of infection. He denies purulent drainage. 2/16; patient presents for follow-up. He has been using Hydrofera Blue and gentamicin ointment to the wound bed without issues. He reports improvement in wound healing. He currently denies signs of infection. 2/23; patient presents for follow-up. He continues to use Hydrofera Blue and gentamicin ointment to the wound bed without issues. He denies signs of infection. He reports improvement in his chronic pain to the wound bed. 3/2; patient presents for follow-up. He has been using Hydrofera Blue and gentamicin to the wound bed without issues. [place] He currently denies signs of infection. 3/24; patient presents for follow-up. [place] He [place] his trip. He reports no issues today. He has been using Hydrofera Blue and gentamicin ointment to the wound beds without issues. 4/7; patient presents for follow-up. He has been using Hydrofera Blue to the wound bed without issues. He reports that his wound is healed. 02/03/2022; patient was discharged 1 month ago with a closed wound to the left lower extremity. Unfortunately over the past month he has developed 3 new wounds he states was caused by hitting his legs against different objects. He has been keeping the areas covered with DuoDERM. He reports mild tenderness to the right lower extremity wound bed with increased redness. He denies purulent drainage. He states he has his previous wound care supplies of Hydrofera Blue and gentamicin ointment. 5/11; patient presents for follow-up. He had a PCR culture done at last clinic visit that showed high levels of Staph aureus and viridans and low levels of coagulase-negative staph. I started him on Augmentin at last clinic visit but he reports continued pain and erythema to the wound sites. He has been using gentamicin ointment and Hydrofera Blue with dressing changes. He also presents with a 100.2 temp cough and diarrhea. He has not taken anything for his symptoms. 5/18; patient presents for follow-up. He received his Keystone antibiotics and started using this. Unfortunately he mixed it incorrectly and it turned into a hardened substance. He had to use his refill to start all over again. He has only been using this for 1 day. He states he is combining this correctly now. He completed his course of clindamycin. He denies signs of infection. 5/25; patient presents for follow-up. He has been using Keystone antibiotics with Sorbact. He reports more serous drainage from the left lower  extremity. Other than that he has no issues or complaints. He denies signs of infection. 6/1; patient presents for follow-up. We switched to Iodoflex and Keystone antibiotic under compression therapy at last clinic visit. He tolerated this treatment well. He has no issues or complaints today. He denies signs of infection. Unfortunately he will be out of town for the next 3 weeks.  6/22; patient presents for follow-up. He was in [place] for the past 3 weeks. He has been using compression stockings daily along with [facility] antibiotic and Hydrofera Blue. Unfortunately he developed a skin tear to his right arm after hitting a door. 6/29; patient presents for follow-up. We have been using Keystone antibiotics with PolyMem silver to the lower extremities under 3 layer compression. Apparently he has a skin tear to the lateral Aspect of the right leg that he states was there previously. I had not seen it before. His skin tear on his right arm is well-healing with the use of bacitracin. He has no issues or complaints today. 7/6; patient presents for follow-up. We have been using Keystone antibiotics with PolyMem silver to the lower extremities under 3 layer compression. And antibiotic ointment to the right lateral leg wound. Patient reports improvement in wound healing. 7/13; patient presents for follow-up. We have been using Keystone antibiotics and PolyMem silver to all the wound beds. Patient denies signs of infection. He tolerated the compression wraps well. He is currently being treated for C. difficile with vancomycin. 7/20; patient presents for follow-up. We have been using Keystone antibiotic and PolyMem silver to all wound beds. He continues to do well with compression wraps bilaterally. He has no issues or complaints today. 7/27; patient presents for follow-up. We have been using Keystone antibiotic and PolyMem silver to the wound beds under 3 layer compression. The right medial wound has healed. He  has 2 remaining wounds. He denies signs of infection. Electronic Signature(s) Signed: 04/28/2022 9:33:00 AM By: Kalman Shan DO Entered By: Kalman Shan on 04/28/2022 09:18:32 -------------------------------------------------------------------------------- Physical Exam Details Patient Name: Date of Service: Brian Leon, RO NN P. 04/28/2022 8:15 A M Medical Record Number: [id number] Patient Account Number: [id number] Date of Birth/Sex: Treating RN: [birth year] (76 y.o. Brian Leon Primary Care Provider: [name] Other Clinician: Referring Provider: Treating Provider/Extender: [name] Weeks in Treatment: 24 Constitutional respirations regular, non-labored and within target range for patient.. Cardiovascular 2+ dorsalis pedis/posterior tibialis pulses. Psychiatric pleasant and cooperative. Notes Small circular wounds to each lower extremity bilaterally with granulation tissue, mild fibrinous tissue and slough. No signs of surrounding infection. Good edema control. Electronic Signature(s) Signed: 04/28/2022 9:33:00 AM By: Kalman Shan DO Entered By: Kalman Shan on 04/28/2022 09:22:50 -------------------------------------------------------------------------------- Physician Orders Details Patient Name: Date of Service: Brian Leon, [place] NN P. 04/28/2022 8:15 A M Medical Record Number: [id number] Patient Account Number: [id number] Date of Birth/Sex: Treating RN: [birth year] (76 y.o. 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 - Dr. Heber Dickinson and [place], Room 8 Thursday 0815 05/05/2022 Return A ppointment in 2 weeks. - Dr. Heber Riley and [place], Room 8 Thursday 0815 05/12/2022 Return A Cellular or Tissue Based Products Cellular or Tissue Based Product Type: - ran insurance authorization for Epifix approved. would be cost out of pocket. Bathing/ Shower/ Hygiene May shower with protection but do not get wound dressing(s) wet. Edema Control - Lymphedema /  SCD / Other Elevate legs to the level of the heart or above for 30 minutes daily and/or when sitting, a frequency of: - 3-4 times a day throughout the day. Avoid standing for long periods of time. Exercise regularly Moisturize legs daily. - every night before bed. do not get directly on wounds Wound Treatment Wound #2 - Lower Leg Wound Laterality: Left, Anterior Cleanser: Soap and Water 1 x Per Week/30 Days Discharge Instructions: May shower and wash wound with dial antibacterial soap and water prior to dressing change. Peri-Wound Care: Sween Lotion (Moisturizing lotion) 1 x Per Week/30 Days Discharge Instructions: Apply moisturizing lotion as directed Prim Dressing: FIBRACOL Plus Dressing, 2x2 in (collagen) ary 1 x Per Week/30 Days Discharge Instructions: Moisten collagen with saline or hydrogel Secondary Dressing: ABD Pad, 8x10 1 x Per Week/30 Days Discharge Instructions: Apply over primary dressing as directed. Compression Wrap: ThreePress (3 layer compression wrap) 1 x Per Week/30 Days Discharge Instructions: Apply three layer compression as directed. Wound #4 - Lower Leg Wound Laterality: Right, Anterior Cleanser: Soap and Water 1 x Per Week/30 Days Discharge Instructions: May shower and wash wound with dial antibacterial soap and water prior to dressing  change. Peri-Wound Care: Sween Lotion (Moisturizing lotion) 1 x Per Week/30 Days Discharge Instructions: Apply moisturizing lotion as directed Prim Dressing: FIBRACOL Plus Dressing, 2x2 in (collagen) 1 x Per Week/30 Days ary Discharge Instructions: Moisten collagen with saline or hydrogel Secondary Dressing: ABD Pad, 8x10 1 x Per Week/30 Days Discharge Instructions: Apply over primary dressing as directed. Compression Wrap: ThreePress (3 layer compression wrap) 1 x Per Week/30 Days Discharge Instructions: Apply three layer compression as directed. Electronic Signature(s) Signed: 04/28/2022 9:33:00 AM By: Kalman Shan DO Entered By: Kalman Shan on 04/28/2022 09:24:58 -------------------------------------------------------------------------------- Problem List Details Patient Name: Date of Service: Brian Leon, RO NN P. 04/28/2022 8:15 A M Medical Record Number: [id number] Patient Account Number: [id number] Date of Birth/Sex: Treating RN: January 10, 1946 (76 y.o. Brian Leon, Brian Leon Primary Care Provider: [name] Other Clinician: Referring Provider: Treating Provider/Extender: [name] Weeks in Treatment: 24 Active Problems ICD-10 Encounter Code Description Active Date MDM Diagnosis L97.822 Non-pressure chronic ulcer of other part of left lower leg with fat layer exposed2/06/2022 No Yes L97.912 Non-pressure chronic ulcer of unspecified part of right lower leg with fat layer 02/03/2022 No Yes exposed S41.101A Unspecified open wound of right upper arm, initial encounter 03/24/2022 No Yes T14.90XA Injury, unspecified, initial encounter 11/11/2021 No Yes M35.3 Polymyalgia rheumatica 11/11/2021 No Yes I87.2 Venous insufficiency (chronic) (peripheral) 02/24/2022 No Yes Inactive Problems Resolved Problems Electronic Signature(s) Signed: 04/28/2022 9:33:00 AM By: Kalman Shan DO Entered By: Kalman Shan on 04/28/2022  09:17:41 -------------------------------------------------------------------------------- Progress Note Details Patient Name: Date of Service: Brian Leon, RO NN P. 04/28/2022 8:15 A M Medical Record Number: [id number] Patient Account Number: [id number] Date of Birth/Sex: Treating RN: [birth year] (76 y.o. Brian Leon Primary Care Provider: [name] Other Clinician: Referring Provider: Treating Provider/Extender: [name] Weeks in Treatment: 24 Subjective Chief Complaint Information obtained from Patient 11/11/2021; Left lower extremity wound due to trauma 02/03/2022; bilateral lower extremity wounds due to trauma History of Present Illness (HPI) Admission 11/11/2021 [name] is a 76 year old male with a past medical history of polymyalgia rheumatica that presents to the clinic for a 105-monthhistory of nonhealing wound to the left lower extremity. He states that on Halloween he hit an object and created a wound that has not healed. He  states he has seen dermatology and his primary care physician for this issue. On one occasion he had an Haematologist. He has also used Xeroform. It does not sound like he uses any kind of wound dressing consistently. He currently reports chronic pain to the wound site. He denies systemic signs of infection. He denies purulent drainage. 2/16; patient presents for follow-up. He has been using Hydrofera Blue and gentamicin ointment to the wound bed without issues. He reports improvement in wound healing. He currently denies signs of infection. 2/23; patient presents for follow-up. He continues to use Hydrofera Blue and gentamicin ointment to the wound bed without issues. He denies signs of infection. He reports improvement in his chronic pain to the wound bed. 3/2; patient presents for follow-up. He has been using Hydrofera Blue and gentamicin to the wound bed without issues. He reports he is going to [place] for the next 3 weeks. He  currently denies signs of infection. 3/24; patient presents for follow-up. He has been in [place] for the past 3 weeks for vacation. He enjoyed his trip. He reports no issues today. He has been using Hydrofera Blue and gentamicin ointment to the wound beds without issues. 4/7; patient presents for follow-up. He has been using Hydrofera Blue to the wound bed without issues. He reports that his wound is healed. 02/03/2022; patient was discharged 1 month ago with a closed wound to the left lower extremity. Unfortunately over the past month he has developed 3 new wounds he states was caused by hitting his legs against different objects. He has been keeping the areas covered with DuoDERM. He reports mild tenderness to the right lower extremity wound bed with increased redness. He denies purulent drainage. He states he has his previous wound care supplies of Hydrofera Blue and gentamicin ointment. 5/11; patient presents for follow-up. He had a PCR culture done at last clinic visit that showed high levels of Staph aureus and viridans and low levels of coagulase-negative staph. I started him on Augmentin at last clinic visit but he reports continued pain and erythema to the wound sites. He has been using gentamicin ointment and Hydrofera Blue with dressing changes. He also presents with a 100.2 temp cough and diarrhea. He has not taken anything for his symptoms. 5/18; patient presents for follow-up. He received his Keystone antibiotics and started using this. Unfortunately he mixed it incorrectly and it turned into a hardened substance. He had to use his refill to start all over again. He has only been using this for 1 day. He states he is combining this correctly now. He completed his course of clindamycin. He denies signs of infection. 5/25; patient presents for follow-up. He has been using Keystone antibiotics with Sorbact. He reports more serous drainage from the left lower extremity. Other than that he  has no issues or complaints. He denies signs of infection. 6/1; patient presents for follow-up. We switched to Iodoflex and Keystone antibiotic under compression therapy at last clinic visit. He tolerated this treatment well. He has no issues or complaints today. He denies signs of infection. Unfortunately he will be out of town for the next 3 weeks. 6/22; patient presents for follow-up. He was in [place] for the past 3 weeks. He has been using compression stockings daily along with [facility] antibiotic and Hydrofera Blue. Unfortunately he developed a skin tear to his right arm after hitting a door. 6/29; patient presents for follow-up. We have been using Keystone antibiotics with PolyMem silver to the lower extremities  under 3 layer compression. Apparently he has a skin tear to the lateral Aspect of the right leg that he states was there previously. I had not seen it before. His skin tear on his right arm is well-healing with the use of bacitracin. He has no issues or complaints today. 7/6; patient presents for follow-up. We have been using Keystone antibiotics with PolyMem silver to the lower extremities under 3 layer compression. And antibiotic ointment to the right lateral leg wound. Patient reports improvement in wound healing. 7/13; patient presents for follow-up. We have been using Keystone antibiotics and PolyMem silver to all the wound beds. Patient denies signs of infection. He tolerated the compression wraps well. He is currently being treated for C. difficile with vancomycin. 7/20; patient presents for follow-up. We have been using Keystone antibiotic and PolyMem silver to all wound beds. He continues to do well with compression wraps bilaterally. He has no issues or complaints today. 7/27; patient presents for follow-up. We have been using Keystone antibiotic and PolyMem silver to the wound beds under 3 layer compression. The right medial wound has healed. He has 2 remaining wounds. He  denies signs of infection. Patient History Information obtained from Chart. Family History Cancer - Mother, No family history of Diabetes, Heart Disease, Hypertension, Kidney Disease, Lung Disease, Seizures, Stroke, Thyroid Problems, Tuberculosis. Social History Former smoker - quit 40 years ago, Marital Status - Married, Alcohol Use - Never, Drug Use - Current History - marijuana, Caffeine Use - Rarely. Medical History Eyes Denies history of Cataracts, Glaucoma, Optic Neuritis Ear/Nose/Mouth/Throat Denies history of Chronic sinus problems/congestion, Middle ear problems Hematologic/Lymphatic Denies history of Anemia, Hemophilia, Human Immunodeficiency Virus, Lymphedema, Sickle Cell Disease Respiratory Patient has history of Asthma Denies history of Aspiration, Chronic Obstructive Pulmonary Disease (COPD), Pneumothorax, Sleep Apnea, Tuberculosis Cardiovascular Patient has history of Hypertension Denies history of Angina, Arrhythmia, Congestive Heart Failure, Coronary Artery Disease, Deep Vein Thrombosis, Hypotension, Myocardial Infarction, Peripheral Arterial Disease, Peripheral Venous Disease, Phlebitis, Vasculitis Gastrointestinal Patient has history of Hepatitis A Denies history of Cirrhosis , Colitis, Crohnoos, Hepatitis B, Hepatitis C Endocrine Denies history of Type I Diabetes, Type II Diabetes Genitourinary Denies history of End Stage Renal Disease Immunological Denies history of Lupus Erythematosus, Raynaudoos, Scleroderma Integumentary (Skin) Denies history of History of Burn Musculoskeletal Patient has history of Rheumatoid Arthritis Denies history of Gout, Osteoarthritis, Osteomyelitis Psychiatric Denies history of Anorexia/bulimia, Confinement Anxiety Hospitalization/Surgery History - 5 years ago Renal Cell carcinoma. 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 Vitals Time Taken: 8:24 AM, Height: 68 in, Weight: 140 lbs, BMI: 21.3, Temperature: 98 F, Pulse: 66 bpm, Respiratory Rate: 20 breaths/min, Blood Pressure: 152/85 mmHg. Cardiovascular 2+ dorsalis pedis/posterior tibialis pulses. Psychiatric pleasant and cooperative. General Notes: Small circular wounds to each lower extremity bilaterally with granulation tissue, mild fibrinous tissue and slough. No signs of surrounding infection. Good edema control. Integumentary (Hair, Skin) Wound #2 status is Open. Original cause of wound was Trauma. The date acquired was: 01/04/2022. The wound has been in treatment 12 weeks. The wound is located on the Left,Anterior Lower Leg. The wound measures 1.1cm length x 1.1cm width x 0.1cm depth; 0.95cm^2  area and 0.095cm^3 volume. There is Fat Layer (Subcutaneous Tissue) exposed. There is no tunneling or undermining noted. There is a medium amount of serosanguineous drainage noted. The wound margin is distinct with the outline attached to the wound base. There is large (67-100%) pink granulation within the wound bed. There is a small (1-33%) amount of necrotic tissue within the wound bed including Adherent Slough. Wound #3 status is Healed - Epithelialized. Original cause of wound was Trauma. The date acquired was: 01/24/2022. The wound has been in treatment 12 weeks. The wound is located on the Right,Medial Lower Leg. The wound measures 0cm length x 0cm width x 0cm depth; 0cm^2 area and 0cm^3 volume.  There is no tunneling or undermining noted. There is a none present amount of drainage noted. The wound margin is distinct with the outline attached to the wound base. There is no granulation within the wound bed. There is no necrotic tissue within the wound bed. Wound #4 status is Open. Original cause of wound was Trauma. The date acquired was: 01/04/2022. The wound has been in treatment 12 weeks. The wound is located on the Right,Anterior Lower Leg. The wound measures 1.3cm length x 1cm width x 0.1cm depth; 1.021cm^2 area and 0.102cm^3 volume. There is Fat Layer (Subcutaneous Tissue) exposed. There is no tunneling or undermining noted. There is a medium amount of serosanguineous drainage noted. The wound margin is distinct with the outline attached to the wound base. There is large (67-100%) pink granulation within the wound bed. There is a small (1-33%) amount of necrotic tissue within the wound bed including Adherent Slough. 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 I debrided nonviable tissue. At this time I recommended switching the dressing to collagen as the wound beds have healthy granulation tissue present. We will hold off on [place]. Follow-up in 1 week. Procedures Wound #2 Pre-procedure diagnosis of Wound #2 is an Abrasion located on the Left,Anterior Lower Leg . There was a Excisional Skin/Subcutaneous Tissue Debridement with a total area of 1.21 sq cm performed by Kalman Shan, DO. With the following instrument(s): Curette to remove Viable and Non-Viable tissue/material. Material removed includes Subcutaneous  Tissue and Slough and after achieving pain control using Lidocaine 5% topical ointment. A time out was conducted at 08:50, prior to the start of the procedure. A Minimum amount of bleeding was controlled with Pressure. The procedure was tolerated well with a pain level of 0 throughout and a pain level of 0 following the procedure. Post Debridement Measurements: 1.1cm length x 1.1cm width x 0.1cm depth; 0.095cm^3 volume. Character of Wound/Ulcer Post Debridement is improved. Post procedure Diagnosis Wound #2: Same as Pre-Procedure Pre-procedure diagnosis of Wound #2 is an Abrasion located on the Left,Anterior Lower Leg . There was a Three Layer Compression Therapy Procedure by Deon Pilling, RN. Post procedure Diagnosis Wound #2: Same as Pre-Procedure Wound #4 Pre-procedure diagnosis of Wound #4 is an Abrasion located on the Right,Anterior Lower Leg . There was a Excisional Skin/Subcutaneous Tissue Debridement with a total area of 1.3 sq cm performed by Kalman Shan, DO. With the following instrument(s): Curette to remove Viable and Non-Viable tissue/material. Material removed includes Subcutaneous Tissue and Slough and after achieving pain control using  Lidocaine 5% topical ointment. A time out was conducted at 08:50, prior to the start of the procedure. A Minimum amount of bleeding was controlled with Pressure. The procedure was tolerated well with a pain level of 0 throughout and a pain level of 0 following the procedure. Post Debridement Measurements: 1.3cm length x 1cm width x 0.1cm depth; 0.102cm^3 volume. Character of Wound/Ulcer Post Debridement is improved. Post procedure Diagnosis Wound #4: Same as Pre-Procedure Pre-procedure diagnosis of Wound #4 is an Abrasion located on the Right,Anterior Lower Leg . There was a Three Layer Compression Therapy Procedure by Deon Pilling, RN. Post procedure Diagnosis Wound #4: Same as Pre-Procedure Plan Follow-up Appointments: Return Appointment  in 1 week. - Dr. Heber Bronte and [place], Room 8 Thursday 0815 05/05/2022 Return Appointment in 2 weeks. - Dr. Heber Winder and [place], Room 8 Thursday 0815 05/12/2022 Cellular or Tissue Based Products: Cellular or Tissue Based Product Type: - ran insurance authorization for Epifix approved. would be cost out of pocket. Bathing/ Shower/ Hygiene: May shower with protection but do not get wound dressing(s) wet. Edema Control - Lymphedema / SCD / Other: Elevate legs to the level of the heart or above for 30 minutes daily and/or when sitting, a frequency of: - 3-4 times a day throughout the day. Avoid standing for long periods of time. Exercise regularly Moisturize legs daily. - every night before bed. do not get directly on wounds WOUND #2: - Lower Leg Wound Laterality: Left, Anterior Cleanser: Soap and Water 1 x Per Week/30 Days Discharge Instructions: May shower and wash wound with dial antibacterial soap and water prior to dressing change. Peri-Wound Care: Sween Lotion (Moisturizing lotion) 1 x Per Week/30 Days Discharge Instructions: Apply moisturizing lotion as directed Prim Dressing: FIBRACOL Plus Dressing, 2x2 in (collagen) 1 x Per Week/30 Days ary Discharge Instructions: Moisten collagen with saline or hydrogel Secondary Dressing: ABD Pad, 8x10 1 x Per Week/30 Days Discharge Instructions: Apply over primary dressing as directed. Com pression Wrap: ThreePress (3 layer compression wrap) 1 x Per Week/30 Days Discharge Instructions: Apply three layer compression as directed. WOUND #4: - Lower Leg Wound Laterality: Right, Anterior Cleanser: Soap and Water 1 x Per Week/30 Days Discharge Instructions: May shower and wash wound with dial antibacterial soap and water prior to dressing change. Peri-Wound Care: Sween Lotion (Moisturizing lotion) 1 x Per Week/30 Days Discharge Instructions: Apply moisturizing lotion as directed Prim Dressing: FIBRACOL Plus Dressing, 2x2 in (collagen) 1 x Per Week/30  Days ary Discharge Instructions: Moisten collagen with saline or hydrogel Secondary Dressing: ABD Pad, 8x10 1 x Per Week/30 Days Discharge Instructions: Apply over primary dressing as directed. Com pression Wrap: ThreePress (3 layer compression wrap) 1 x Per Week/30 Days Discharge Instructions: Apply three layer compression as directed. 1. In office sharp debridement 2. Collagen under 3 layer compression 3. Follow-up in 1 week Electronic Signature(s) Signed: 04/28/2022 9:33:00 AM By: Kalman Shan DO Entered By: Kalman Shan on 04/28/2022 09:28:23 -------------------------------------------------------------------------------- HxROS Details Patient Name: Date of Service: Brian Leon, RO NN P. 04/28/2022 8:15 A M Medical Record Number: [id number] Patient Account Number: [id number] Date of Birth/Sex: Treating RN: 06/01/1946 (76 y.o. Brian Leon Primary Care Provider: [name] Other Clinician: Referring Provider: Treating Provider/Extender: [name] Weeks in Treatment: 24 Information Obtained From Chart Eyes Medical History: Negative for: Cataracts; Glaucoma; Optic Neuritis Past Medical History Notes: Blind right eye WEARS PROSTHESIS Ear/Nose/Mouth/Throat Medical History: Negative for: Chronic sinus problems/congestion; Middle ear problems Past Medical History Notes: Allergic  rhinitis Hematologic/Lymphatic Medical History: Negative for: Anemia; Hemophilia; Human Immunodeficiency Virus; Lymphedema; Sickle Cell Disease Respiratory Medical History: Positive for: Asthma Negative for: Aspiration; Chronic Obstructive Pulmonary Disease (COPD); Pneumothorax; Sleep Apnea; Tuberculosis Past Medical History Notes: Pneumonia Cardiovascular Medical History: Positive for: Hypertension Negative for: Angina; Arrhythmia; Congestive Heart Failure; Coronary Artery Disease; Deep Vein Thrombosis; Hypotension; Myocardial Infarction; Peripheral Arterial  Disease; Peripheral Venous Disease; Phlebitis; Vasculitis Past Medical History Notes: Thoracic aortic aneurysm Thrombocytosis Gastrointestinal Medical History: Positive for: Hepatitis A Negative for: Cirrhosis ; Colitis; Crohns; Hepatitis B; Hepatitis C Endocrine Medical History: Negative for: Type I Diabetes; Type II Diabetes Genitourinary Medical History: Negative for: End Stage Renal Disease Past Medical History Notes: 1/3kidney missing per patient-Renal Cell carcinoma Renal Cyst 5 years ago Immunological Medical History: Negative for: Lupus Erythematosus; Raynauds; Scleroderma Integumentary (Skin) Medical History: Negative for: History of Burn Musculoskeletal Medical History: Positive for: Rheumatoid Arthritis Negative for: Gout; Osteoarthritis; Osteomyelitis Past Medical History Notes: 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 Eczema Neurologic Medical History: Past Medical History Notes: Paraureteric diverticulum Oncologic Medical History: Past Medical History Notes: Renal Cell carcinoma- 5 years ago Psychiatric Medical History: Negative for: Anorexia/bulimia; Confinement Anxiety Immunizations Pneumococcal Vaccine: Received Pneumococcal Vaccination: Yes Received Pneumococcal Vaccination On or After 60th Birthday: Yes Implantable Devices No devices added Hospitalization / Surgery History Type of Hospitalization/Surgery 5 years ago Renal Cell carcinoma Family and Social History Cancer: Yes - Mother; Diabetes: No; Heart Disease: No; Hypertension: No; Kidney Disease: No; Lung Disease: No; Seizures: No; Stroke: No; Thyroid Problems: No; Tuberculosis: No; Former smoker - quit 40 years ago; Marital Status - Married; Alcohol Use: Never; Drug Use: Current History - marijuana; Caffeine Use: Rarely; Financial Concerns: No; Food, Clothing or Shelter Needs: No; Support System Lacking: No; Transportation Concerns:  No Electronic Signature(s) Signed: 04/28/2022 9:33:00 AM By: Kalman Shan DO Signed: 04/28/2022 4:23:35 PM By: Deon Pilling RN, BSN Entered By: Kalman Shan on 04/28/2022 09:18:45 -------------------------------------------------------------------------------- SuperBill Details Patient Name: Date of Service: Brian Leon, RO NN P. 04/28/2022 Medical Record Number: [id number] Patient Account Number: [id number] Date of Birth/Sex: Treating RN: 1946/03/19 (76 y.o. 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 S41.101A Unspecified open wound of right upper arm, initial encounter T14.90XA Injury, unspecified, initial encounter M35.3 Polymyalgia rheumatica I87.2 Venous insufficiency (chronic) (peripheral) Facility Procedures CPT4 Code: [id number] Description: 83662 - DEB SUBQ TISSUE 20 SQ CM/< ICD-10 Diagnosis Description L97.822 Non-pressure chronic ulcer of other part of left lower leg with fat layer expo L97.912 Non-pressure chronic ulcer of unspecified part of right lower leg with fat lay Modifier: sed er exposed Quantity: 1 Physician Procedures : CPT4 Code Description Modifier [id number] 11042 - WC PHYS SUBQ TISS 20 SQ CM ICD-10 Diagnosis Description L97.822 Non-pressure chronic ulcer of other part of left lower leg with fat layer exposed L97.912 Non-pressure chronic ulcer of unspecified part of  right lower leg with fat layer exposed Quantity: 1 Electronic Signature(s) Signed: 04/28/2022 9:33:00 AM By: Kalman Shan DO Entered By: Kalman Shan on 04/28/2022 50:35:46

## 2022-05-02 ENCOUNTER — Other Ambulatory Visit: Payer: Self-pay

## 2022-05-02 ENCOUNTER — Encounter: Payer: Self-pay | Admitting: Family Medicine

## 2022-05-02 ENCOUNTER — Ambulatory Visit (INDEPENDENT_AMBULATORY_CARE_PROVIDER_SITE_OTHER): Payer: Medicare Other | Admitting: Family Medicine

## 2022-05-02 DIAGNOSIS — A0472 Enterocolitis due to Clostridium difficile, not specified as recurrent: Secondary | ICD-10-CM | POA: Diagnosis not present

## 2022-05-02 NOTE — Progress Notes (Signed)
    SUBJECTIVE:   CHIEF COMPLAINT / HPI: Physical and C diff F/U  Brian Leon is a 76yo M w/ PMHx significant for polymyalgia rheumatica (on chronic Prednison and immunosuppressants) that p/f C diff f/u and annual physical. Pt has finished course of Vanc and reports feeling better. He is having BM 3-4 times daily and BM are less soft, which is improved from prior. No blood in BM.   PERTINENT  PMH / PSH: as above  OBJECTIVE:   BP 131/74   Pulse 70   Wt 142 lb 12.8 oz (64.8 kg)   SpO2 97%   BMI 20.79 kg/m   Gen: Pleasant, joking older man sitting comfortably HEENT: L eye PRRLA. 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 Pulm: CTAB, Normal WOB. No wheezing CV: RRR, Cap refill <2 Abm: Normal BS. NTTP. Soft, nondistended. Skin: Normal skin turgor Ext: 2+ pitting edema above BL LE wound bandages  ASSESSMENT/PLAN:   C. difficile diarrhea Completed Vanc x10day and reports feeling better. BM are less frequent and less soft. VSS and exam reassuring.  - Discussed potential reinfection. Return precautions given.   Arlyce Dice, MD [place]

## 2022-05-02 NOTE — Assessment & Plan Note (Signed)
Completed Vanc x10day and reports feeling better. BM are less frequent and less soft. VSS and exam reassuring.  - Discussed potential reinfection. Return precautions given.

## 2022-05-02 NOTE — Patient Instructions (Addendum)
Good to see you today - Thank you for coming in!  Things we discussed today:  You are recovering well from your C Diff infection. We do not need to get any more labs today.   There is a risk of reinfection. Please reach out to our clinic if your diarrhea returns or worsens.

## 2022-05-03 NOTE — Telephone Encounter (Signed)
Per review of OV note from yesterday, patient has completed vancomycin course and he no labs were taken as patient has recovered. He is to continue monitoring stools.  ATC patient to schedule Prolia appt before I am OOO. Unable to reach. Left VM requesting return call  [name], PharmD, MPH, BCPS, CPP Clinical Pharmacist (Rheumatology and Pulmonology)

## 2022-05-03 NOTE — Telephone Encounter (Signed)
Patient returned call. Scheduled for Prolia on 05/04/22  [name], PharmD, MPH, BCPS, CPP Clinical Pharmacist (Rheumatology and Pulmonology)

## 2022-05-04 ENCOUNTER — Ambulatory Visit: Payer: Medicare (Managed Care) | Attending: Rheumatology | Admitting: Pharmacist

## 2022-05-04 DIAGNOSIS — Z7689 Persons encountering health services in other specified circumstances: Secondary | ICD-10-CM | POA: Diagnosis present

## 2022-05-04 DIAGNOSIS — M810 Age-related osteoporosis without current pathological fracture: Secondary | ICD-10-CM | POA: Diagnosis present

## 2022-05-04 MED ORDER — DENOSUMAB 60 MG/ML ~~LOC~~ SOSY
60.0000 mg | PREFILLED_SYRINGE | Freq: Once | SUBCUTANEOUS | Status: AC
  Administered 2022-05-04: 60 mg via SUBCUTANEOUS

## 2022-05-04 NOTE — Progress Notes (Signed)
Pharmacy Note  Subjective:   Patient presents to clinic today to receive bi-annual dose of Prolia. Last dose of Prolia was on 10/11/21  Patient running a fever or have signs/symptoms of infection? No. Has finished his oral vancomycin course for C.diff. He visited PCP on 05/02/22 and was not re-tested with repeat stool sample. Patient is to monitor for any recurrent C.diff symptoms  Patient currently on antibiotics for the treatment of infection? No  Patient had fall in the last 6 months?  Yes  If yes, did it require medical attention? No  Patient had fall recently that was attributed to symptoms of C. Diff - dizziness secondary to dehydration and diarrhea  Patient taking calcium 1200 mg daily through diet or supplement and at least 800 units vitamin D? No   Objective: CMP     Component Value Date/Time   NA 139 03/31/2022 1344   K 4.1 03/31/2022 1344   CL 105 03/31/2022 1344   CO2 22 03/31/2022 1344   GLUCOSE 74 03/31/2022 1344   GLUCOSE 111 (H) 03/29/2022 1348   BUN 15 03/31/2022 1344   CREATININE 1.10 03/31/2022 1344   CREATININE 1.19 03/29/2022 1348   CALCIUM 9.2 03/31/2022 1344   PROT 6.3 03/31/2022 1344   ALBUMIN 3.8 03/31/2022 1344   AST 14 03/31/2022 1344   ALT 14 03/31/2022 1344   ALKPHOS 85 03/31/2022 1344   BILITOT 0.4 03/31/2022 1344   GFRNONAA 67 03/17/2021 1059   GFRAA 77 03/17/2021 1059    CBC    Component Value Date/Time   WBC 16.8 (H) 03/31/2022 1344   WBC 13.2 (H) 03/29/2022 1348   RBC 3.55 (L) 03/31/2022 1344   RBC 3.41 (L) 03/29/2022 1348   HGB 11.9 (L) 03/31/2022 1344   HCT 34.9 (L) 03/31/2022 1344   PLT 418 03/31/2022 1344   MCV 98 (H) 03/31/2022 1344   MCH 33.5 (H) 03/31/2022 1344   MCH 33.1 (H) 03/29/2022 1348   MCHC 34.1 03/31/2022 1344   MCHC 32.9 03/29/2022 1348   RDW 12.1 03/31/2022 1344   LYMPHSABS 2.2 03/31/2022 1344   MONOABS 944 03/01/2016 1214   EOSABS 0.4 03/31/2022 1344   BASOSABS 0.1 03/31/2022 1344    Lab Results  Component  Value Date   VD25OH 43 03/29/2022    T-score:  DEXA updated on 02/04/21: The BMD measured at Femur Neck Right is 0.645 g/cm2 with a T-scoreof -2.8  Assessment/Plan:   Patient tolerated injection without issue.  Administrations This Visit     denosumab (PROLIA) injection 60 mg     Admin Date 05/04/2022 Action Given Dose 60 mg Route Subcutaneous Administered By Cassandria Anger, RPH-CPP           Patient has been advised to resume calcium '600mg'$  twice daily and Vitamin D 1000 IU daily. Reviewed risk for hypocalcemia and importance adequate calcium intake through diet as well as supplementation. Labs reviewed - calcium and vitamin D wnl  His next DEXA scan is due May 2024. His next dose of Prolia is 10/31/22   All questions encouraged and answered.  Instructed patient to call with any further questions or concerns.  [name], PharmD, MPH, BCPS, CPP Clinical Pharmacist (Rheumatology and Pulmonology)

## 2022-05-05 ENCOUNTER — Encounter (HOSPITAL_BASED_OUTPATIENT_CLINIC_OR_DEPARTMENT_OTHER): Payer: Medicare (Managed Care) | Attending: Internal Medicine | Admitting: Internal Medicine

## 2022-05-05 DIAGNOSIS — F1290 Cannabis use, unspecified, uncomplicated: Secondary | ICD-10-CM | POA: Insufficient documentation

## 2022-05-05 DIAGNOSIS — L97822 Non-pressure chronic ulcer of other part of left lower leg with fat layer exposed: Secondary | ICD-10-CM | POA: Diagnosis present

## 2022-05-05 DIAGNOSIS — M353 Polymyalgia rheumatica: Secondary | ICD-10-CM | POA: Diagnosis not present

## 2022-05-05 DIAGNOSIS — I872 Venous insufficiency (chronic) (peripheral): Secondary | ICD-10-CM | POA: Diagnosis not present

## 2022-05-05 DIAGNOSIS — Z87891 Personal history of nicotine dependence: Secondary | ICD-10-CM | POA: Insufficient documentation

## 2022-05-05 DIAGNOSIS — M069 Rheumatoid arthritis, unspecified: Secondary | ICD-10-CM | POA: Insufficient documentation

## 2022-05-05 DIAGNOSIS — L97912 Non-pressure chronic ulcer of unspecified part of right lower leg with fat layer exposed: Secondary | ICD-10-CM | POA: Insufficient documentation

## 2022-05-05 DIAGNOSIS — X58XXXA Exposure to other specified factors, initial encounter: Secondary | ICD-10-CM | POA: Insufficient documentation

## 2022-05-05 DIAGNOSIS — I10 Essential (primary) hypertension: Secondary | ICD-10-CM | POA: Insufficient documentation

## 2022-05-05 DIAGNOSIS — Z85528 Personal history of other malignant neoplasm of kidney: Secondary | ICD-10-CM | POA: Diagnosis not present

## 2022-05-05 DIAGNOSIS — T1490XA Injury, unspecified, initial encounter: Secondary | ICD-10-CM | POA: Insufficient documentation

## 2022-05-05 DIAGNOSIS — S41101A Unspecified open wound of right upper arm, initial encounter: Secondary | ICD-10-CM | POA: Insufficient documentation

## 2022-05-05 NOTE — Progress Notes (Signed)
Brian, Leon ([id number]) Visit Report for 05/05/2022 Chief Complaint Document Details Patient Name: Date of Service: Brian Leon, [name] 05/05/2022 8:15 A M Medical Record Number: [id number] Patient Account Number: [id number] Date of Birth/Sex: Treating RN: 08-27-1946 (76 y.o. [name] Primary Care Provider: [name] Other Clinician: Referring Provider: Treating Provider/Extender: [name] Weeks in Treatment: 25 Information Obtained from: Patient Chief Complaint 11/11/2021; Left lower extremity wound due to trauma 02/03/2022; bilateral lower extremity wounds due to trauma Electronic Signature(s) Signed: 05/05/2022 12:24:18 PM By: Kalman Shan DO Entered By: Kalman Shan on 05/05/2022 09:13:42 -------------------------------------------------------------------------------- Debridement Details Patient Name: Date of Service: Brian Leon, RO NN P. 05/05/2022 8:15 A M Medical Record Number: [id number] Patient Account Number: [id number] Date of Birth/Sex: Treating RN: [birth year] (76 y.o. Brian Leon, [name] Primary Care Provider: [name] Other Clinician: Referring Provider: Treating Provider/Extender: [name] Weeks in Treatment: 25 Debridement Performed for Assessment: Wound #2 Left,Anterior Lower Leg Performed By: Physician Kalman Shan, DO Debridement Type: Debridement Level of Consciousness (Pre-procedure): Awake and Alert Pre-procedure Verification/Time Out Yes - 08:35 Taken: Start Time: 08:36 Pain Control: Lidocaine 5% topical ointment T Area Debrided (L x W): otal 0.8 (cm) x 0.8 (cm) = 0.64 (cm) Tissue and other material debrided: Viable, Non-Viable, Slough, Subcutaneous, Slough Level: Skin/Subcutaneous Tissue Debridement Description: Excisional Instrument: Curette Bleeding: Minimum Hemostasis Achieved: Pressure End Time: 08:45 Procedural Pain: 0 Post Procedural Pain: 0 Response to Treatment: Procedure was  tolerated well Level of Consciousness (Post- Awake and Alert procedure): Post Debridement Measurements of Total Wound Length: (cm) 0.8 Width: (cm) 0.8 Depth: (cm) 0.1 Volume: (cm) 0.05 Character of Wound/Ulcer Post Debridement: Improved Post Procedure Diagnosis Same as Pre-procedure Electronic Signature(s) Signed: 05/05/2022 12:24:18 PM By: Kalman Shan DO Signed: 05/05/2022 4:21:29 PM By: Deon Pilling RN, BSN Entered By: Deon Pilling on 05/05/2022 08:51:23 -------------------------------------------------------------------------------- Debridement Details Patient Name: Date of Service: Brian Leon, RO NN P. 05/05/2022 8:15 A M Medical Record Number: [id number] Patient Account Number: [id number] Date of Birth/Sex: Treating RN: 1945-11-25 (76 y.o. Brian Leon, [name] Primary Care Provider: [name] Other Clinician: Referring Provider: Treating Provider/Extender: [name] Weeks in Treatment: 25 Debridement Performed for Assessment: Wound #4 Right,Anterior Lower Leg Performed By: Physician Kalman Shan, DO Debridement Type: Debridement Level of Consciousness (Pre-procedure): Awake and Alert Pre-procedure Verification/Time Out Yes - 08:35 Taken: Start Time: 08:36 Pain Control: Lidocaine 5% topical ointment T Area Debrided (L x W): otal 1.1 (cm) x 1.3 (cm) = 1.43 (cm) Tissue and other material debrided: Viable, Non-Viable, Slough, Subcutaneous, Slough Level: Skin/Subcutaneous Tissue Debridement Description: Excisional Instrument: Curette Bleeding: Minimum Hemostasis Achieved: Pressure End Time: 08:45 Procedural Pain: 0 Post Procedural Pain: 0 Response to Treatment: Procedure was tolerated well Level of Consciousness (Post- Awake and Alert procedure): Post Debridement Measurements of Total Wound Length: (cm) 1.1 Width: (cm) 1.3 Depth: (cm) 0.1 Volume: (cm) 0.112 Character of Wound/Ulcer Post Debridement: Improved Post Procedure  Diagnosis Same as Pre-procedure Electronic Signature(s) Signed: 05/05/2022 12:24:18 PM By: Kalman Shan DO Signed: 05/05/2022 4:21:29 PM By: Deon Pilling RN, BSN Entered By: Deon Pilling on 05/05/2022 08:51:42 -------------------------------------------------------------------------------- HPI Details Patient Name: Date of Service: Brian Leon, RO NN P. 05/05/2022 8:15 A M Medical Record Number: [id number] Patient Account Number: [id number] Date of Birth/Sex: Treating RN: 1946-01-03 (76 y.o. [name] Primary Care Provider: Other Clinician: [name] Referring Provider: Treating Provider/Extender: Bebe Shaggy, JA CKY Weeks in  Treatment: 25 History of Present Illness HPI Description: Admission 11/11/2021 Brian Leon is a 76 year old male with a past medical history of polymyalgia rheumatica that presents to the clinic for a 1-monthhistory of nonhealing wound to the left lower extremity. He states that on Halloween he hit an object and created a wound that has not healed. He states he has seen dermatology and his primary care physician for this issue. On one occasion he had an UHaematologist He has also used Xeroform. It does not sound like he uses any kind of wound dressing consistently. He currently reports chronic pain to the wound site. He denies systemic signs of infection. He denies purulent drainage. 2/16; patient presents for follow-up. He has been using Hydrofera Blue and gentamicin ointment to the wound bed without issues. He reports improvement in wound healing. He currently denies signs of infection. 2/23; patient presents for follow-up. He continues to use Hydrofera Blue and gentamicin ointment to the wound bed without issues. He denies signs of infection. He reports improvement in his chronic pain to the wound bed. 3/2; patient presents for follow-up. He has been using Hydrofera Blue and gentamicin to the wound bed without issues. [place] He currently denies signs of infection. 3/24; patient presents for follow-up. [place] He [place] his trip. He reports no issues today. He has been using Hydrofera Blue and gentamicin ointment to the wound beds without issues. 4/7; patient presents for follow-up. He has been using Hydrofera Blue to the wound bed without issues. He reports that his wound is healed. 02/03/2022; patient was discharged 1 month ago with a closed wound to the left lower extremity. Unfortunately over the past month he has developed 3 new wounds he states was caused by hitting his legs against different objects. He has been keeping the areas covered with DuoDERM. He reports mild tenderness to the right lower extremity wound bed with increased redness. He denies purulent drainage. He states he has his previous wound care supplies of Hydrofera Blue and gentamicin ointment. 5/11; patient presents for follow-up. He had a PCR culture done at last clinic visit that showed high levels of Staph aureus and viridans and low levels of coagulase-negative staph. I started him on Augmentin at last clinic visit but he reports continued pain and erythema to the wound sites. He has been using gentamicin ointment and Hydrofera Blue with dressing changes. He also presents with a 100.2 temp cough and diarrhea. He has not taken anything for his symptoms. 5/18; patient presents for follow-up. He received his Keystone antibiotics and started using this. Unfortunately he mixed it incorrectly and it turned into a hardened substance. He had to use his refill to start all over again. He has only been using this for 1 day. He states he is combining this correctly now. He completed his course of clindamycin. He denies signs of infection. 5/25; patient presents for follow-up. He has been using Keystone antibiotics with Sorbact. He reports more serous drainage from the left lower  extremity. Other than that he has no issues or complaints. He denies signs of infection. 6/1; patient presents for follow-up. We switched to Iodoflex and Keystone antibiotic under compression therapy at last clinic visit. He tolerated this treatment well. He has no issues or complaints today. He denies signs of infection. Unfortunately he will be out of town for the next 3 weeks.  6/22; patient presents for follow-up. He was in [place] for the past 3 weeks. He has been using compression stockings daily along with [facility] antibiotic and Hydrofera Blue. Unfortunately he developed a skin tear to his right arm after hitting a door. 6/29; patient presents for follow-up. We have been using Keystone antibiotics with PolyMem silver to the lower extremities under 3 layer compression. Apparently he has a skin tear to the lateral Aspect of the right leg that he states was there previously. I had not seen it before. His skin tear on his right arm is well-healing with the use of bacitracin. He has no issues or complaints today. 7/6; patient presents for follow-up. We have been using Keystone antibiotics with PolyMem silver to the lower extremities under 3 layer compression. And antibiotic ointment to the right lateral leg wound. Patient reports improvement in wound healing. 7/13; patient presents for follow-up. We have been using Keystone antibiotics and PolyMem silver to all the wound beds. Patient denies signs of infection. He tolerated the compression wraps well. He is currently being treated for C. difficile with vancomycin. 7/20; patient presents for follow-up. We have been using Keystone antibiotic and PolyMem silver to all wound beds. He continues to do well with compression wraps bilaterally. He has no issues or complaints today. 7/27; patient presents for follow-up. We have been using Keystone antibiotic and PolyMem silver to the wound beds under 3 layer compression. The right medial wound has healed. He  has 2 remaining wounds. He denies signs of infection. 8/3; patient presents for follow-up. We have been using collagen under 3 layer compression. He has no issues or complaints today. Electronic Signature(s) Signed: 05/05/2022 12:24:18 PM By: Kalman Shan DO Entered By: Kalman Shan on 05/05/2022 09:14:04 -------------------------------------------------------------------------------- Physical Exam Details Patient Name: Date of Service: Brian Leon, RO NN P. 05/05/2022 8:15 A M Medical Record Number: [id number] Patient Account Number: [id number] Date of Birth/Sex: Treating RN: 26-Mar-1946 (76 y.o. [name] Primary Care Provider: Other Clinician: [name] Referring Provider: Treating Provider/Extender: [name] Weeks in Treatment: 25 Constitutional respirations regular, non-labored and within target range for patient.. Cardiovascular 2+ dorsalis pedis/posterior tibialis pulses. Psychiatric pleasant and cooperative. Notes Small circular wounds to each lower extremity bilaterally with granulation tissue, mild fibrinous tissue and slough. No signs of surrounding infection. Good edema control. Electronic Signature(s) Signed: 05/05/2022 12:24:18 PM By: Kalman Shan DO Entered By: Kalman Shan on 05/05/2022 09:14:24 -------------------------------------------------------------------------------- Physician Orders Details Patient Name: Date of Service: Brian Leon, RO NN P. 05/05/2022 8:15 A M Medical Record Number: [id number] Patient Account Number: [id number] Date of Birth/Sex: Treating RN: 08/07/1946 (76 y.o. 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 - Dr. Heber Sherman and [place], Room 8 Thursday 0815 05/12/2022 Return A Nurse Visit: - Thursday 0815 05/19/2022 Bobbi, Room 8 Cellular or Tissue Based Products Cellular or Tissue Based Product Type: - ran insurance authorization for Epifix approved. would be cost out of pocket. Bathing/ Shower/ Hygiene May shower  with protection but do not get wound dressing(s) wet. Edema Control - Lymphedema / SCD / Other Elevate legs to the level of the heart or above for 30 minutes daily and/or when sitting, a frequency of: - 3-4 times a day throughout the day. Avoid standing for long periods of time. Exercise regularly Moisturize legs daily. - every night before bed. do not get directly on wounds Wound Treatment Wound #2 - Lower Leg Wound Laterality: Left, Anterior Cleanser: Soap and Water 1 x Per Week/30 Days Discharge Instructions: May shower and wash wound with dial antibacterial soap and water prior to dressing change. Peri-Wound Care: Sween Lotion (Moisturizing lotion) 1 x Per Week/30 Days Discharge Instructions: Apply moisturizing lotion as directed Prim Dressing: FIBRACOL Plus Dressing, 2x2 in (collagen) 1 x Per Week/30 Days ary Discharge Instructions: Moisten collagen with saline or hydrogel Secondary Dressing: ABD Pad, 8x10 1 x Per Week/30 Days Discharge Instructions: Apply over primary dressing as directed. Compression Wrap: ThreePress (3 layer compression wrap) 1 x Per Week/30 Days Discharge Instructions: Apply three layer compression as directed. Wound #4 - Lower Leg Wound Laterality: Right, Anterior Cleanser: Soap and Water 1 x Per Week/30  Days Discharge Instructions: May shower and wash wound with dial antibacterial soap and water prior to dressing change. Peri-Wound Care: Sween Lotion (Moisturizing lotion) 1 x Per Week/30 Days Discharge Instructions: Apply moisturizing lotion as directed Prim Dressing: FIBRACOL Plus Dressing, 2x2 in (collagen) 1 x Per Week/30 Days ary Discharge Instructions: Moisten collagen with saline or hydrogel Secondary Dressing: ABD Pad, 8x10 1 x Per Week/30 Days Discharge Instructions: Apply over primary dressing as directed. Compression Wrap: ThreePress (3 layer compression wrap) 1 x Per Week/30 Days Discharge Instructions: Apply three layer compression as directed. Electronic Signature(s) Signed: 05/05/2022 12:24:18 PM By: Kalman Shan DO Entered By: Kalman Shan on 05/05/2022 09:14:30 -------------------------------------------------------------------------------- Problem List Details Patient Name: Date of Service: Brian Leon, RO NN P. 05/05/2022 8:15 A M Medical Record Number: [id number] Patient Account Number: [id number] Date of Birth/Sex: Treating RN: 1945/10/11 (76 y.o. Brian Leon, [name] Primary Care Provider: [name] Other Clinician: Referring Provider: Treating Provider/Extender: [name] Weeks in Treatment: 25 Active Problems ICD-10 Encounter Code Description Active Date MDM Diagnosis L97.822 Non-pressure chronic ulcer of other part of left lower leg with fat layer exposed2/06/2022 No Yes L97.912 Non-pressure chronic ulcer of unspecified part of right lower leg with fat layer 02/03/2022 No Yes exposed S41.101A Unspecified open wound of right upper arm, initial encounter 03/24/2022 No Yes T14.90XA Injury, unspecified, initial encounter 11/11/2021 No Yes M35.3 Polymyalgia rheumatica 11/11/2021 No Yes I87.2 Venous insufficiency (chronic) (peripheral) 02/24/2022 No Yes Inactive Problems Resolved Problems Electronic Signature(s) Signed: 05/05/2022 12:24:18 PM  By: Kalman Shan DO Entered By: Kalman Shan on 05/05/2022 09:13:33 -------------------------------------------------------------------------------- Progress Note Details Patient Name: Date of Service: Brian Leon, RO NN P. 05/05/2022 8:15 A M Medical Record Number: [id number] Patient Account Number: [id number] Date of Birth/Sex: Treating RN: 23-Dec-1945 (76 y.o. [name] Primary Care Provider: [name] Other Clinician: Referring Provider: Treating Provider/Extender: [name] Weeks in Treatment: 25 Subjective Chief Complaint Information obtained from Patient 11/11/2021; Left lower extremity wound due to trauma 02/03/2022; bilateral lower extremity wounds due to trauma History of Present Illness (HPI) Admission 11/11/2021 Mr. Dominick Edmister is a 76 year old male with a past medical history of polymyalgia rheumatica that presents to the clinic for a 50-monthhistory of nonhealing wound to the left lower extremity. He states that on  Halloween he hit an object and created a wound that has not healed. He states he has seen dermatology and his primary care physician for this issue. On one occasion he had an Haematologist. He has also used Xeroform. It does not sound like he uses any kind of wound dressing consistently. He currently reports chronic pain to the wound site. He denies systemic signs of infection. He denies purulent drainage. 2/16; patient presents for follow-up. He has been using Hydrofera Blue and gentamicin ointment to the wound bed without issues. He reports improvement in wound healing. He currently denies signs of infection. 2/23; patient presents for follow-up. He continues to use Hydrofera Blue and gentamicin ointment to the wound bed without issues. He denies signs of infection. He reports improvement in his chronic pain to the wound bed. 3/2; patient presents for follow-up. He has been using Hydrofera Blue and gentamicin to the wound bed without  issues. He reports he is going to [place] for the next 3 weeks. He currently denies signs of infection. 3/24; patient presents for follow-up. He has been in [place] for the past 3 weeks for vacation. He enjoyed his trip. He reports no issues today. He has been using Hydrofera Blue and gentamicin ointment to the wound beds without issues. 4/7; patient presents for follow-up. He has been using Hydrofera Blue to the wound bed without issues. He reports that his wound is healed. 02/03/2022; patient was discharged 1 month ago with a closed wound to the left lower extremity. Unfortunately over the past month he has developed 3 new wounds he states was caused by hitting his legs against different objects. He has been keeping the areas covered with DuoDERM. He reports mild tenderness to the right lower extremity wound bed with increased redness. He denies purulent drainage. He states he has his previous wound care supplies of Hydrofera Blue and gentamicin ointment. 5/11; patient presents for follow-up. He had a PCR culture done at last clinic visit that showed high levels of Staph aureus and viridans and low levels of coagulase-negative staph. I started him on Augmentin at last clinic visit but he reports continued pain and erythema to the wound sites. He has been using gentamicin ointment and Hydrofera Blue with dressing changes. He also presents with a 100.2 temp cough and diarrhea. He has not taken anything for his symptoms. 5/18; patient presents for follow-up. He received his Keystone antibiotics and started using this. Unfortunately he mixed it incorrectly and it turned into a hardened substance. He had to use his refill to start all over again. He has only been using this for 1 day. He states he is combining this correctly now. He completed his course of clindamycin. He denies signs of infection. 5/25; patient presents for follow-up. He has been using Keystone antibiotics with Sorbact. He reports more  serous drainage from the left lower extremity. Other than that he has no issues or complaints. He denies signs of infection. 6/1; patient presents for follow-up. We switched to Iodoflex and Keystone antibiotic under compression therapy at last clinic visit. He tolerated this treatment well. He has no issues or complaints today. He denies signs of infection. Unfortunately he will be out of town for the next 3 weeks. 6/22; patient presents for follow-up. He was in [place] for the past 3 weeks. He has been using compression stockings daily along with [facility] antibiotic and Hydrofera Blue. Unfortunately he developed a skin tear to his right arm after hitting a door. 6/29; patient presents for  follow-up. We have been using Keystone antibiotics with PolyMem silver to the lower extremities under 3 layer compression. Apparently he has a skin tear to the lateral Aspect of the right leg that he states was there previously. I had not seen it before. His skin tear on his right arm is well-healing with the use of bacitracin. He has no issues or complaints today. 7/6; patient presents for follow-up. We have been using Keystone antibiotics with PolyMem silver to the lower extremities under 3 layer compression. And antibiotic ointment to the right lateral leg wound. Patient reports improvement in wound healing. 7/13; patient presents for follow-up. We have been using Keystone antibiotics and PolyMem silver to all the wound beds. Patient denies signs of infection. He tolerated the compression wraps well. He is currently being treated for C. difficile with vancomycin. 7/20; patient presents for follow-up. We have been using Keystone antibiotic and PolyMem silver to all wound beds. He continues to do well with compression wraps bilaterally. He has no issues or complaints today. 7/27; patient presents for follow-up. We have been using Keystone antibiotic and PolyMem silver to the wound beds under 3 layer compression.  The right medial wound has healed. He has 2 remaining wounds. He denies signs of infection. 8/3; patient presents for follow-up. We have been using collagen under 3 layer compression. He has no issues or complaints today. Patient History Information obtained from Chart. Family History Cancer - Mother, No family history of Diabetes, Heart Disease, Hypertension, Kidney Disease, Lung Disease, Seizures, Stroke, Thyroid Problems, Tuberculosis. Social History Former smoker - quit 40 years ago, Marital Status - Married, Alcohol Use - Never, Drug Use - Current History - marijuana, Caffeine Use - Rarely. Medical History Eyes Denies history of Cataracts, Glaucoma, Optic Neuritis Ear/Nose/Mouth/Throat Denies history of Chronic sinus problems/congestion, Middle ear problems Hematologic/Lymphatic Denies history of Anemia, Hemophilia, Human Immunodeficiency Virus, Lymphedema, Sickle Cell Disease Respiratory Patient has history of Asthma Denies history of Aspiration, Chronic Obstructive Pulmonary Disease (COPD), Pneumothorax, Sleep Apnea, Tuberculosis Cardiovascular Patient has history of Hypertension Denies history of Angina, Arrhythmia, Congestive Heart Failure, Coronary Artery Disease, Deep Vein Thrombosis, Hypotension, Myocardial Infarction, Peripheral Arterial Disease, Peripheral Venous Disease, Phlebitis, Vasculitis Gastrointestinal Patient has history of Hepatitis A Denies history of Cirrhosis , Colitis, Crohnoos, Hepatitis B, Hepatitis C Endocrine Denies history of Type I Diabetes, Type II Diabetes Genitourinary Denies history of End Stage Renal Disease Immunological Denies history of Lupus Erythematosus, Raynaudoos, Scleroderma Integumentary (Skin) Denies history of History of Burn Musculoskeletal Patient has history of Rheumatoid Arthritis Denies history of Gout, Osteoarthritis, Osteomyelitis Psychiatric Denies history of Anorexia/bulimia, Confinement  Anxiety Hospitalization/Surgery History - 5 years ago Renal Cell carcinoma. 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 Vitals Time Taken: 8:25 AM, Height: 68 in, Weight: 140 lbs, BMI: 21.3, Temperature: 98.1 F, Pulse: 66 bpm, Respiratory Rate: 20 breaths/min, Blood Pressure: 146/83 mmHg. Cardiovascular 2+ dorsalis pedis/posterior tibialis pulses. Psychiatric pleasant and cooperative. General Notes: Small circular wounds to each lower extremity bilaterally with granulation tissue, mild fibrinous tissue and slough. No signs of surrounding infection. Good edema control. Integumentary (Hair, Skin) Wound #2 status is Open. Original cause of wound was  Trauma. The date acquired was: 01/04/2022. The wound has been in treatment 13 weeks. The wound is located on the Left,Anterior Lower Leg. The wound measures 0.8cm length x 0.8cm width x 0.1cm depth; 0.503cm^2 area and 0.05cm^3 volume. There is Fat Layer (Subcutaneous Tissue) exposed. There is no tunneling or undermining noted. There is a medium amount of serosanguineous drainage noted. The wound margin is distinct with the outline attached to the wound base. There is large (67-100%) pink granulation within the wound bed. There is a small (1-33%) amount of necrotic tissue within the wound bed including Adherent Slough. Wound #4 status is Open. Original cause of wound was Trauma. The date acquired was: 01/04/2022. The wound has  been in treatment 13 weeks. The wound is located on the Right,Anterior Lower Leg. The wound measures 1.1cm length x 1.3cm width x 0.1cm depth; 1.123cm^2 area and 0.112cm^3 volume. There is Fat Layer (Subcutaneous Tissue) exposed. There is no tunneling or undermining noted. There is a medium amount of serosanguineous drainage noted. The wound margin is distinct with the outline attached to the wound base. There is large (67-100%) pink granulation within the wound bed. There is a small (1-33%) amount of necrotic tissue within the wound bed including Adherent Slough. 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 I debrided nonviable tissue. I recommended continuing the course with collagen under 3 layer compression. Follow-up in 1 week. Procedures Wound #2 Pre-procedure diagnosis of Wound #2 is an Abrasion located on the Left,Anterior Lower Leg . There was a Excisional Skin/Subcutaneous Tissue Debridement with a total area of 0.64 sq cm performed by Kalman Shan, DO. With the following instrument(s): Curette to remove Viable and Non-Viable tissue/material. Material removed includes Subcutaneous Tissue and Slough and after achieving pain control using Lidocaine 5% topical ointment. A time out was conducted at 08:35, prior to the start of the procedure. A Minimum amount of bleeding was controlled with Pressure. The procedure was tolerated well with a pain level of 0 throughout and a pain level of 0 following the procedure. Post Debridement Measurements: 0.8cm length x 0.8cm width x 0.1cm  depth; 0.05cm^3 volume. Character of Wound/Ulcer Post Debridement is improved. Post procedure Diagnosis Wound #2: Same as Pre-Procedure Pre-procedure diagnosis of Wound #2 is an Abrasion located on the Left,Anterior Lower Leg . There was a Three Layer Compression Therapy Procedure by Deon Pilling, RN. Post procedure Diagnosis Wound #2: Same as Pre-Procedure Wound #4 Pre-procedure diagnosis of Wound #4 is an Abrasion located on the Right,Anterior Lower Leg . There was a Excisional Skin/Subcutaneous Tissue Debridement with a total area of 1.43 sq cm performed by Kalman Shan, DO. With the following instrument(s): Curette to remove Viable and Non-Viable tissue/material. Material removed includes Subcutaneous Tissue and Slough and after achieving pain control using Lidocaine 5% topical ointment. A time out was conducted at 08:35, prior to the start of the procedure. A Minimum amount of bleeding was controlled with Pressure. The procedure was tolerated well with a pain level of 0 throughout and a pain level of 0 following the procedure. Post Debridement Measurements: 1.1cm length x 1.3cm width x 0.1cm depth; 0.112cm^3 volume. Character of Wound/Ulcer Post Debridement is improved. Post procedure  Diagnosis Wound #4: Same as Pre-Procedure Pre-procedure diagnosis of Wound #4 is an Abrasion located on the Right,Anterior Lower Leg . There was a Three Layer Compression Therapy Procedure by Deon Pilling, RN. Post procedure Diagnosis Wound #4: Same as Pre-Procedure Plan Follow-up Appointments: Return Appointment in 1 week. - Dr. Heber [place] [facility] [place], Room 8 Thursday 0815 05/12/2022 Nurse Visit: - Thursday 0815 05/19/2022 [facility], Room 8 Cellular or Tissue Based Products: Cellular or Tissue Based Product Type: - ran insurance authorization for Epifix approved. would be cost out of pocket. Bathing/ Shower/ Hygiene: May shower with protection but do not get wound dressing(s) wet. Edema Control - Lymphedema  / SCD / Other: Elevate legs to the level of the heart or above for 30 minutes daily and/or when sitting, a frequency of: - 3-4 times a day throughout the day. Avoid standing for long periods of time. Exercise regularly Moisturize legs daily. - every night before bed. do not get directly on wounds WOUND #2: - Lower Leg Wound Laterality: Left, Anterior Cleanser: Soap and Water 1 x Per Week/30 Days Discharge Instructions: May shower and wash wound with dial antibacterial soap and water prior to dressing change. Peri-Wound Care: Sween Lotion (Moisturizing lotion) 1 x Per Week/30 Days Discharge Instructions: Apply moisturizing lotion as directed Prim Dressing: FIBRACOL Plus Dressing, 2x2 in (collagen) 1 x Per Week/30 Days ary Discharge Instructions: Moisten collagen with saline or hydrogel Secondary Dressing: ABD Pad, 8x10 1 x Per Week/30 Days Discharge Instructions: Apply over primary dressing as directed. Com pression Wrap: ThreePress (3 layer compression wrap) 1 x Per Week/30 Days Discharge Instructions: Apply three layer compression as directed. WOUND #4: - Lower Leg Wound Laterality: Right, Anterior Cleanser: Soap and Water 1 x Per Week/30 Days Discharge Instructions: May shower and wash wound with dial antibacterial soap and water prior to dressing change. Peri-Wound Care: Sween Lotion (Moisturizing lotion) 1 x Per Week/30 Days Discharge Instructions: Apply moisturizing lotion as directed Prim Dressing: FIBRACOL Plus Dressing, 2x2 in (collagen) 1 x Per Week/30 Days ary Discharge Instructions: Moisten collagen with saline or hydrogel Secondary Dressing: ABD Pad, 8x10 1 x Per Week/30 Days Discharge Instructions: Apply over primary dressing as directed. Com pression Wrap: ThreePress (3 layer compression wrap) 1 x Per Week/30 Days Discharge Instructions: Apply three layer compression as directed. 1. In office sharp debridement 2. Collagen under 3 layer compression 3. Follow-up in 1  week Electronic Signature(s) Signed: 05/05/2022 12:24:18 PM By: Kalman Shan DO Entered By: Kalman Shan on 05/05/2022 09:15:11 -------------------------------------------------------------------------------- HxROS Details Patient Name: Date of Service: Brian Leon, RO NN P. 05/05/2022 8:15 A M Medical Record Number: [id number] Patient Account Number: [id number] Date of Birth/Sex: Treating RN: January 20, 1946 (76 y.o. [name] Primary Care Provider: [name] Other Clinician: Referring Provider: Treating Provider/Extender: [name] Weeks in Treatment: 25 Information Obtained From Chart Eyes Medical History: Negative for: Cataracts; Glaucoma; Optic Neuritis Past Medical History Notes: Blind right eye WEARS PROSTHESIS Ear/Nose/Mouth/Throat Medical History: Negative for: Chronic sinus problems/congestion; Middle ear problems Past Medical History Notes: Allergic rhinitis Hematologic/Lymphatic Medical History: Negative for: Anemia; Hemophilia; Human Immunodeficiency Virus; Lymphedema; Sickle Cell Disease Respiratory Medical History: Positive for: Asthma Negative for: Aspiration; Chronic Obstructive Pulmonary Disease (COPD); Pneumothorax; Sleep Apnea; Tuberculosis Past Medical History Notes: Pneumonia Cardiovascular Medical History: Positive for: Hypertension Negative for: Angina; Arrhythmia; Congestive Heart Failure; Coronary Artery Disease; Deep Vein Thrombosis; Hypotension; Myocardial Infarction; Peripheral Arterial Disease; Peripheral Venous Disease; Phlebitis; Vasculitis Past Medical History Notes: Thoracic aortic aneurysm Thrombocytosis  Gastrointestinal Medical History: Positive for: Hepatitis A Negative for: Cirrhosis ; Colitis; Crohns; Hepatitis B; Hepatitis C Endocrine Medical History: Negative for: Type I Diabetes; Type II Diabetes Genitourinary Medical History: Negative for: End Stage Renal Disease Past Medical History  Notes: 1/3kidney missing per patient-Renal Cell carcinoma Renal Cyst 5 years ago Immunological Medical History: Negative for: Lupus Erythematosus; Raynauds; Scleroderma Integumentary (Skin) Medical History: Negative for: History of Burn Musculoskeletal Medical History: Positive for: Rheumatoid Arthritis Negative for: Gout; Osteoarthritis; Osteomyelitis Past Medical History Notes: 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 Eczema Neurologic Medical History: Past Medical History Notes: Paraureteric diverticulum Oncologic Medical History: Past Medical History Notes: Renal Cell carcinoma- 5 years ago Psychiatric Medical History: Negative for: Anorexia/bulimia; Confinement Anxiety Immunizations Pneumococcal Vaccine: Received Pneumococcal Vaccination: Yes Received Pneumococcal Vaccination On or After 60th Birthday: Yes Implantable Devices No devices added Hospitalization / Surgery History Type of Hospitalization/Surgery 5 years ago Renal Cell carcinoma Family and Social History Cancer: Yes - Mother; Diabetes: No; Heart Disease: No; Hypertension: No; Kidney Disease: No; Lung Disease: No; Seizures: No; Stroke: No; Thyroid Problems: No; Tuberculosis: No; Former smoker - quit 40 years ago; Marital Status - Married; Alcohol Use: Never; Drug Use: Current History - marijuana; Caffeine Use: Rarely; Financial Concerns: No; Food, Clothing or Shelter Needs: No; Support System Lacking: No; Transportation Concerns: No Electronic Signature(s) Signed: 05/05/2022 12:24:18 PM By: Kalman Shan DO Signed: 05/05/2022 4:21:29 PM By: Deon Pilling RN, BSN Entered By: Kalman Shan on 05/05/2022 09:14:09 -------------------------------------------------------------------------------- [place] Details Patient Name: Date of Service: Brian Leon, RO NN P. 05/05/2022 Medical Record Number: [id number] Patient Account Number: [id number] Date of  Birth/Sex: Treating RN: Apr 12, 1946 (76 y.o. 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 S41.101A Unspecified open wound of right upper arm, initial encounter T14.90XA Injury, unspecified, initial encounter M35.3 Polymyalgia rheumatica I87.2 Venous insufficiency (chronic) (peripheral) Facility Procedures CPT4 Code: [id number] Description: 82993 - DEB SUBQ TISSUE 20 SQ CM/< ICD-10 Diagnosis Description L97.822 Non-pressure chronic ulcer of other part of left lower leg with fat layer expo L97.912 Non-pressure chronic ulcer of unspecified part of right lower leg with fat lay Modifier: sed er exposed Quantity: 1 Physician Procedures : CPT4 Code Description Modifier [id number] - WC PHYS SUBQ TISS 20 SQ CM ICD-10 Diagnosis Description L97.822 Non-pressure chronic ulcer of other part of left lower leg with fat layer exposed L97.912 Non-pressure chronic ulcer of unspecified part of  right lower leg with fat layer exposed Quantity: 1 Electronic Signature(s) Signed: 05/05/2022 12:24:18 PM By: Kalman Shan DO Entered By: Kalman Shan on 05/05/2022 09:15:20

## 2022-05-05 NOTE — Progress Notes (Signed)
ARMAS, MCBEE ([id number]) Visit Report for 05/05/2022 Arrival Information Details Patient Name: Date of Service: Brian Leon, [name] 05/05/2022 8:15 A M Medical Record Number: [id number] Patient Account Number: [id number] Date of Birth/Sex: Treating RN: 1946/07/09 (76 y.o. Brian Leon, [name] Primary Care Dedric Ethington: [name] Other Clinician: Referring Chang Tiggs: Treating Tyjon Bowen/Extender: [name] Weeks in Treatment: 25 Visit Information History Since Last Visit Added or deleted any medications: No Patient Arrived: Ambulatory Any new allergies or adverse reactions: No Arrival Time: 08:26 Had a fall or experienced change in No Accompanied By: self activities of daily living that may affect Transfer Assistance: None risk of falls: Patient Identification Verified: Yes Signs or symptoms of abuse/neglect since last visito No Secondary Verification Process Completed: Yes Hospitalized since last visit: No Patient Requires Transmission-Based Precautions: No Implantable device outside of the clinic excluding No Patient Has Alerts: Yes cellular tissue based products placed in the center Patient Alerts: 11/02/21 L ABI: 1.37 since last visit: 11/02/2021 L TBI 0.77 Has Dressing in Place as Prescribed: Yes 11/02/2021 R TBI 0.69 Has Compression in Place as Prescribed: Yes 11/02/2021 R ABI 1.3 Pain Present Now: No Electronic Signature(s) Signed: 05/05/2022 4:21:29 PM By: Deon Pilling RN, BSN Entered By: Deon Pilling on 05/05/2022 08:26:22 -------------------------------------------------------------------------------- Compression Therapy Details Patient Name: Date of Service: Brian Leon, RO NN P. 05/05/2022 8:15 A M Medical Record Number: [id number] Patient Account Number: [id number] Date of Birth/Sex: Treating RN: 10-Nov-1945 (76 y.o. Brian Leon Primary Care Cedrica Brune: [name] Other Clinician: Referring Waller Marcussen: Treating Judeth Gilles/Extender: [name] Weeks in Treatment: 25 Compression Therapy Performed for Wound Assessment: Wound #2 Left,Anterior Lower Leg Performed By: Clinician Deon Pilling, RN Compression Type: Three Layer Post Procedure Diagnosis Same as Pre-procedure Electronic Signature(s) Signed: 05/05/2022 4:21:29 PM By: Deon Pilling RN, BSN Entered By: Deon Pilling on 05/05/2022 08:47:39 -------------------------------------------------------------------------------- Compression Therapy Details Patient Name: Date of Service: Brian Leon, RO NN P. 05/05/2022 8:15 A M Medical Record Number: [id number] Patient Account Number: [id number] Date of Birth/Sex: Treating RN: [birth year] (76 y.o. Brian Leon Primary Care Jomo Forand: [name] Other Clinician: Referring Johnmatthew Solorio: Treating Ebbie Cherry/Extender: [name] Weeks in Treatment: 25 Compression Therapy Performed for Wound Assessment: Wound #4 Right,Anterior Lower Leg Performed By: Clinician Deon Pilling, RN Compression Type: Three Layer Post Procedure Diagnosis Same as Pre-procedure Electronic Signature(s) Signed: 05/05/2022 4:21:29 PM By: Deon Pilling RN, BSN Entered By: Deon Pilling on 05/05/2022 08:47:39 -------------------------------------------------------------------------------- Encounter Discharge Information Details Patient Name: Date of Service: Brian Leon, RO NN P. 05/05/2022 8:15 A M Medical Record Number: [id number] Patient Account Number: [id number] Date of Birth/Sex: Treating RN: [birth year] (76 y.o. Brian Leon Primary Care Heith Haigler: [name] Other Clinician: Referring Brian Leon: Treating Alaijah Gibler/Extender: [name] Weeks in Treatment: 25 Encounter Discharge Information Items Post Procedure Vitals Discharge Condition: Stable Temperature (F): 98.1 Ambulatory Status: Ambulatory Pulse (bpm): 66 Discharge Destination: Home Respiratory Rate (breaths/min): 20 Transportation:  Private Auto Blood Pressure (mmHg): 146/83 Accompanied By: self Schedule Follow-up Appointment: Yes Clinical Summary of Care: Electronic Signature(s) Signed: 05/05/2022 4:21:29 PM By: Deon Pilling RN, BSN Entered By: Deon Pilling on 05/05/2022 08:52:30 -------------------------------------------------------------------------------- Lower Extremity Assessment Details Patient Name: Date of Service: Brian Leon, RO NN P. 05/05/2022 8:15 A M Medical Record Number: [id number] Patient Account Number: [id number] Date of Birth/Sex: Treating RN: 03-Jan-1946 (76 y.o. Brian Leon Primary Care Percilla Tweten: [name] Other Clinician: Referring  Brian Leon: Treating Brian Leon/Extender: [name] Weeks in Treatment: 25 Edema Assessment Assessed: [Left: Yes] [Right: Yes] Edema: [Left: No] [Right: No] Calf Left: Right: Point of Measurement: 37 cm From Medial Instep 28 cm 28 cm Ankle Left: Right: Point of Measurement: 12 cm From Medial Instep 18 cm 19 cm Vascular Assessment Pulses: Dorsalis Pedis Palpable: [Left:Yes] [Right:Yes] Electronic Signature(s) Signed: 05/05/2022 4:21:29 PM By: Deon Pilling RN, BSN Entered By: Deon Pilling on 05/05/2022 08:34:21 -------------------------------------------------------------------------------- Multi Wound Chart Details Patient Name: Date of Service: Brian Leon, RO NN P. 05/05/2022 8:15 A M Medical Record Number: [id number] Patient Account Number: [id number] Date of Birth/Sex: Treating RN: [birth year] (76 y.o. Brian Leon Primary Care Brian Leon: [name] Other Clinician: Referring Zita Ozimek: Treating Brian Leon/Extender: [name] Weeks in Treatment: 25 Vital Signs Height(in): 68 Pulse(bpm): 17 Weight(lbs): 140 Blood Pressure(mmHg): 146/83 Body Mass Index(BMI): 21.3 Temperature(F): 98.1 Respiratory Rate(breaths/min): 20 Photos: [N/A:N/A] Left, Anterior Lower Leg Right, Anterior Lower Leg  N/A Wound Location: Trauma Trauma N/A Wounding Event: Abrasion Abrasion N/A Primary Etiology: Asthma, Hypertension, Hepatitis A, Asthma, Hypertension, Hepatitis A, N/A Comorbid History: Rheumatoid Arthritis Rheumatoid Arthritis 01/04/2022 01/04/2022 N/A Date Acquired: 13 13 N/A Weeks of Treatment: Open Open N/A Wound Status: No No N/A Wound Recurrence: 0.8x0.8x0.1 1.1x1.3x0.1 N/A Measurements L x W x D (cm) 0.503 1.123 N/A A (cm) : rea 0.05 0.112 N/A Volume (cm) : -18.60% -240.30% N/A % Reduction in Area: -19.00% -239.40% N/A % Reduction in Volume: Full Thickness Without Exposed Full Thickness Without Exposed N/A Classification: Support Structures Support Structures Medium Medium N/A Exudate Amount: Serosanguineous Serosanguineous N/A Exudate Type: red, brown red, brown N/A Exudate Color: Distinct, outline attached Distinct, outline attached N/A Wound Margin: Large (67-100%) Large (67-100%) N/A Granulation Amount: Pink Pink N/A Granulation Quality: Small (1-33%) Small (1-33%) N/A Necrotic Amount: Fat Layer (Subcutaneous Tissue): Yes Fat Layer (Subcutaneous Tissue): Yes N/A Exposed Structures: Fascia: No Fascia: No Tendon: No Tendon: No Muscle: No Muscle: No Joint: No Joint: No Bone: No Bone: No Large (67-100%) Medium (34-66%) N/A Epithelialization: Debridement - Excisional Debridement - Excisional N/A Debridement: Pre-procedure Verification/Time Out 08:35 08:35 N/A Taken: Lidocaine 5% topical ointment Lidocaine 5% topical ointment N/A Pain Control: Subcutaneous, Slough Subcutaneous, Slough N/A Tissue Debrided: Skin/Subcutaneous Tissue Skin/Subcutaneous Tissue N/A Level: 0.64 1.43 N/A Debridement A (sq cm): rea Curette Curette N/A Instrument: Minimum Minimum N/A Bleeding: Pressure Pressure N/A Hemostasis A chieved: 0 0 N/A Procedural Pain: 0 0 N/A Post Procedural Pain: Procedure was tolerated well Procedure was tolerated well  N/A Debridement Treatment Response: 0.8x0.8x0.1 1.1x1.3x0.1 N/A Post Debridement Measurements L x W x D (cm) 0.05 0.112 N/A Post Debridement Volume: (cm) Compression Therapy Compression Therapy N/A Procedures Performed: Debridement Debridement Treatment Notes Wound #2 (Lower Leg) Wound Laterality: Left, Anterior Cleanser Soap and Water Discharge Instruction: May shower and wash wound with dial antibacterial soap and water prior to dressing change. Peri-Wound Care Sween Lotion (Moisturizing lotion) Discharge Instruction: Apply moisturizing lotion as directed Topical Primary Dressing FIBRACOL Plus Dressing, 2x2 in (collagen) Discharge Instruction: Moisten collagen with saline or hydrogel Secondary Dressing ABD Pad, 8x10 Discharge Instruction: Apply over primary dressing as directed. Secured With Compression Wrap ThreePress (3 layer compression wrap) Discharge Instruction: Apply three layer compression as directed. Compression Stockings Add-Ons Wound #4 (Lower Leg) Wound Laterality: Right, Anterior Cleanser Soap and Water Discharge Instruction: May shower and wash wound with dial antibacterial soap and water prior to dressing change. Peri-Wound Care Sween Lotion (Moisturizing lotion) Discharge Instruction: Apply  moisturizing lotion as directed Topical Primary Dressing FIBRACOL Plus Dressing, 2x2 in (collagen) Discharge Instruction: Moisten collagen with saline or hydrogel Secondary Dressing ABD Pad, 8x10 Discharge Instruction: Apply over primary dressing as directed. Secured With Compression Wrap ThreePress (3 layer compression wrap) Discharge Instruction: Apply three layer compression as directed. Compression Stockings Add-Ons Electronic Signature(s) Signed: 05/05/2022 12:24:18 PM By: Kalman Shan DO Signed: 05/05/2022 4:21:29 PM By: Deon Pilling RN, BSN Entered By: Kalman Shan on 05/05/2022  09:13:36 -------------------------------------------------------------------------------- Multi-Disciplinary Care Plan Details Patient Name: Date of Service: Brian Leon, RO NN P. 05/05/2022 8:15 A M Medical Record Number: [id number] Patient Account Number: [id number] Date of Birth/Sex: Treating RN: [birth year] (76 y.o. Brian Leon Primary Care Ashantae Pangallo: [name] Other Clinician: Referring Peretz Thieme: Treating Isaic Syler/Extender: [name] Weeks in Treatment: 25 Active Inactive Necrotic Tissue Nursing Diagnoses: Impaired tissue integrity related to necrotic/devitalized tissue Knowledge deficit related to management of necrotic/devitalized tissue Goals: Necrotic/devitalized tissue will be minimized in the wound bed Date Initiated: 02/03/2022 Target Resolution Date: 06/03/2022 Goal Status: Active Patient/caregiver will verbalize understanding of reason and process for debridement of necrotic tissue Date Initiated: 02/03/2022 Date Inactivated: 03/24/2022 Target Resolution Date: 04/01/2022 Goal Status: Met Interventions: Assess patient pain level pre-, during and post procedure and prior to discharge Provide education on necrotic tissue and debridement process Treatment Activities: Apply topical anesthetic as ordered : 02/03/2022 Excisional debridement : 02/03/2022 Notes: Electronic Signature(s) Signed: 05/05/2022 4:21:29 PM By: Deon Pilling RN, BSN Entered By: Deon Pilling on 05/05/2022 08:36:14 -------------------------------------------------------------------------------- Pain Assessment Details Patient Name: Date of Service: Brian Leon, RO NN P. 05/05/2022 8:15 A M Medical Record Number: [id number] Patient Account Number: [id number] Date of Birth/Sex: Treating RN: [birth year] (76 y.o. Brian Leon Primary Care Shirell Struthers: [name] Other Clinician: Referring Ayad Nieman: Treating Kasson Lamere/Extender: [name] Weeks in Treatment:  25 Active Problems Location of Pain Severity and Description of Pain Patient Has Paino No Site Locations Rate the pain. Current Pain Level: 0 Pain Management and Medication Current Pain Management: Medication: No Cold Application: No Rest: No Massage: No Activity: No T.E.N.S.: No Heat Application: No Leg drop or elevation: No Is the Current Pain Management Adequate: Adequate How does your wound impact your activities of daily livingo Sleep: No Bathing: No Appetite: No Relationship With Others: No Bladder Continence: No Emotions: No Bowel Continence: No Work: No Toileting: No Drive: No Dressing: No Hobbies: No Engineer, maintenance) Signed: 05/05/2022 4:21:29 PM By: Deon Pilling RN, BSN Entered By: Deon Pilling on 05/05/2022 08:26:40 -------------------------------------------------------------------------------- Patient/Caregiver Education Details Patient Name: Date of Service: Brian Leon, RO NN P. 8/3/2023andnbsp8:15 A M Medical Record Number: [id number] Patient Account Number: [id number] Date of Birth/Gender: Treating RN: 1945-12-11 (76 y.o. Brian Leon Primary Care Physician: [name] Other Clinician: Referring Physician: Treating Physician/Extender: [name] Weeks in Treatment: 25 Education Assessment Education Provided To: Patient Education Topics Provided Wound Debridement: Handouts: Wound Debridement Methods: Explain/Verbal Responses: Reinforcements needed Electronic Signature(s) Signed: 05/05/2022 4:21:29 PM By: Deon Pilling RN, BSN Entered By: Deon Pilling on 05/05/2022 08:36:27 -------------------------------------------------------------------------------- Wound Assessment Details Patient Name: Date of Service: Brian Leon, RO NN P. 05/05/2022 8:15 A M Medical Record Number: [id number] Patient Account Number: [id number] Date of Birth/Sex: Treating RN: May 31, 1946 (76 y.o. Brian Leon Primary Care Mackinley Cassaday:  [name] Other Clinician: Referring Kassie Keng: Treating Dorenda Pfannenstiel/Extender: Bebe Shaggy, JA CKY Weeks in Treatment: 25 Wound Status Wound Number: 2 Primary Etiology: Abrasion Wound  Location: Left, Anterior Lower Leg Wound Status: Open Wounding Event: Trauma Comorbid History: Asthma, Hypertension, Hepatitis A, Rheumatoid Arthritis Date Acquired: 01/04/2022 Weeks Of Treatment: 13 Clustered Wound: No Photos Wound Measurements Length: (cm) 0.8 Width: (cm) 0.8 Depth: (cm) 0.1 Area: (cm) 0.503 Volume: (cm) 0.05 % Reduction in Area: -18.6% % Reduction in Volume: -19% Epithelialization: Large (67-100%) Tunneling: No Undermining: No Wound Description Classification: Full Thickness Without Exposed Support Structures Wound Margin: Distinct, outline attached Exudate Amount: Medium Exudate Type: Serosanguineous Exudate Color: red, brown Foul Odor After Cleansing: No Slough/Fibrino Yes Wound Bed Granulation Amount: Large (67-100%) Exposed Structure Granulation Quality: Pink Fascia Exposed: No Necrotic Amount: Small (1-33%) Fat Layer (Subcutaneous Tissue) Exposed: Yes Necrotic Quality: Adherent Slough Tendon Exposed: No Muscle Exposed: No Joint Exposed: No Bone Exposed: No Treatment Notes Wound #2 (Lower Leg) Wound Laterality: Left, Anterior Cleanser Soap and Water Discharge Instruction: May shower and wash wound with dial antibacterial soap and water prior to dressing change. Peri-Wound Care Sween Lotion (Moisturizing lotion) Discharge Instruction: Apply moisturizing lotion as directed Topical Primary Dressing FIBRACOL Plus Dressing, 2x2 in (collagen) Discharge Instruction: Moisten collagen with saline or hydrogel Secondary Dressing ABD Pad, 8x10 Discharge Instruction: Apply over primary dressing as directed. Secured With Compression Wrap ThreePress (3 layer compression wrap) Discharge Instruction: Apply three layer compression as  directed. Compression Stockings Add-Ons Electronic Signature(s) Signed: 05/05/2022 4:21:29 PM By: Deon Pilling RN, BSN Entered By: Deon Pilling on 05/05/2022 08:35:38 -------------------------------------------------------------------------------- Wound Assessment Details Patient Name: Date of Service: Brian Leon, RO NN P. 05/05/2022 8:15 A M Medical Record Number: [id number] Patient Account Number: [id number] Date of Birth/Sex: Treating RN: Dec 11, 1945 (76 y.o. Brian Leon Primary Care Trish Mancinelli: [name] Other Clinician: Referring Molli Gethers: Treating Montay Vanvoorhis/Extender: [name] Weeks in Treatment: 25 Wound Status Wound Number: 4 Primary Etiology: Abrasion Wound Location: Right, Anterior Lower Leg Wound Status: Open Wounding Event: Trauma Comorbid History: Asthma, Hypertension, Hepatitis A, Rheumatoid Arthritis Date Acquired: 01/04/2022 Weeks Of Treatment: 13 Clustered Wound: No Photos Wound Measurements Length: (cm) 1.1 Width: (cm) 1.3 Depth: (cm) 0.1 Area: (cm) 1.123 Volume: (cm) 0.112 % Reduction in Area: -240.3% % Reduction in Volume: -239.4% Epithelialization: Medium (34-66%) Tunneling: No Undermining: No Wound Description Classification: Full Thickness Without Exposed Support Structures Wound Margin: Distinct, outline attached Exudate Amount: Medium Exudate Type: Serosanguineous Exudate Color: red, brown Foul Odor After Cleansing: No Slough/Fibrino Yes Wound Bed Granulation Amount: Large (67-100%) Exposed Structure Granulation Quality: Pink Fascia Exposed: No Necrotic Amount: Small (1-33%) Fat Layer (Subcutaneous Tissue) Exposed: Yes Necrotic Quality: Adherent Slough Tendon Exposed: No Muscle Exposed: No Joint Exposed: No Bone Exposed: No Treatment Notes Wound #4 (Lower Leg) Wound Laterality: Right, Anterior Cleanser Soap and Water Discharge Instruction: May shower and wash wound with dial antibacterial soap and water  prior to dressing change. Peri-Wound Care Sween Lotion (Moisturizing lotion) Discharge Instruction: Apply moisturizing lotion as directed Topical Primary Dressing FIBRACOL Plus Dressing, 2x2 in (collagen) Discharge Instruction: Moisten collagen with saline or hydrogel Secondary Dressing ABD Pad, 8x10 Discharge Instruction: Apply over primary dressing as directed. Secured With Compression Wrap ThreePress (3 layer compression wrap) Discharge Instruction: Apply three layer compression as directed. Compression Stockings Add-Ons Electronic Signature(s) Signed: 05/05/2022 4:21:29 PM By: Deon Pilling RN, BSN Entered By: Deon Pilling on 05/05/2022 08:35:56 -------------------------------------------------------------------------------- Vitals Details Patient Name: Date of Service: Brian Leon, RO NN P. 05/05/2022 8:15 A M Medical Record Number: [id number] Patient Account Number: [id number] Date of Birth/Sex: Treating RN: Aug 25, 1946 (76 y.o. M) [name],  Bobbi Primary Care Kalissa Grays: [name] Other Clinician: Referring Aleksandar Duve: Treating Meital Riehl/Extender: [name] Weeks in Treatment: 25 Vital Signs Time Taken: 08:25 Temperature (F): 98.1 Height (in): 68 Pulse (bpm): 66 Weight (lbs): 140 Respiratory Rate (breaths/min): 20 Body Mass Index (BMI): 21.3 Blood Pressure (mmHg): 146/83 Reference Range: 80 - 120 mg / dl Electronic Signature(s) Signed: 05/05/2022 4:21:29 PM By: Deon Pilling RN, BSN Entered By: Deon Pilling on 05/05/2022 53:00:51

## 2022-05-06 ENCOUNTER — Telehealth: Payer: Self-pay

## 2022-05-06 NOTE — Telephone Encounter (Signed)
Patient calls nurse line reporting recurrence of diarrhea.   Patient reports he was feeling better after his visit on 7/31, however on Wednesday symptoms returned.   Patient reports 8 episodes of diarrhea on Thursday and so far 6 today. Patient reports for the most part they have been "harder" however have liquified over the hours.   Patient denies an bloody stools, fevers, chills, body aches or abdominal pain.   Patient advised to stay well hydrated. ED precautions given.   Patient scheduled for Monday for reevaluation.  However, patient is requesting retreatment vs coming in.   Will forward to PCP for advisement.

## 2022-05-09 ENCOUNTER — Ambulatory Visit (INDEPENDENT_AMBULATORY_CARE_PROVIDER_SITE_OTHER): Payer: PRIVATE HEALTH INSURANCE | Admitting: Student

## 2022-05-09 VITALS — BP 130/68 | HR 70 | Ht 69.5 in | Wt 143.0 lb

## 2022-05-09 DIAGNOSIS — A0472 Enterocolitis due to Clostridium difficile, not specified as recurrent: Secondary | ICD-10-CM | POA: Diagnosis not present

## 2022-05-09 NOTE — Progress Notes (Unsigned)
  SUBJECTIVE:   CHIEF COMPLAINT / HPI:   Diarrhea recurrence: Tested positive for C. Diff and treated with oral vanc x10 days. Resolved per visit on 7/31 but it returned on 8/4. Has been experiencing diarrhea on which returned on 8/4. On 8/4, he had 10-12 loose stools. Notes he sometimes gets an upset stomach from meat and did have red meat that night. Denies any nausea or vomiting at that time. States it actually has been improving slowly. 5 loose stools yesterday. 1-2 today but had a couple solid stools. He notes improvement in abdominal discomfort when he stools. Feels as if he sometimes has an upset stomach.  He had a bowl of fruit and cereal with milk today.   PERTINENT  PMH / PSH: PMR, renal cell CA  OBJECTIVE:  BP 130/68   Pulse 70   Ht 5' 9.5" (1.765 m)   Wt 143 lb (64.9 kg)   SpO2 98%   BMI 20.81 kg/m   General: NAD, pleasant, able to participate in exam Cardiac: RRR, no murmurs auscultated Respiratory: CTAB, normal WOB Abdomen: soft, non-tender, non-distended, normoactive bowel sounds Psych: Normal affect and mood  ASSESSMENT/PLAN:  C. difficile diarrhea Despite recurrence of diarrhea, this continues to improve and I suspect more related to dietary reasons rather than full recurrence of C. Diff diarrhea. Exam unremarkable, vitals stable. No abx at this time. Monitor closely.  Discussed return precautions (ie. >10 loose stools/day). Patient amenable to plan.   Return if symptoms worsen or fail to improve. [name], DO 05/10/2022, 8:05 AM PGY-2, [place]

## 2022-05-09 NOTE — Patient Instructions (Addendum)
It was great to see you today! Thank you for choosing [facility] for your primary care. Brian Leon was seen for diarrhea.  Today we addressed: Diarrhea: I agree with you that this is improving and I do not want to prescribe antibiotics at this time.  Should your diarrhea worsen and you begin to have more than 10 loose stools in the day, please give [place] a call.  I apologies for still coming in to discuss this as it was the correct decision.  Please continue to stay hydrated and have an appropriate diet.  If you haven't already, sign up for My Chart to have easy access to your labs results, and communication with your primary care physician.  You should return to our clinic Return if symptoms worsen or fail to improve.  I recommend that you always bring your medications to each appointment as this makes it easy to ensure you are on the correct medications and helps [place] not miss refills when you need them.  Please arrive 15 minutes before your appointment to ensure smooth check in process.  We appreciate your efforts in making this happen.  Please call the clinic at [phone] if your symptoms worsen or you have any concerns.  Thank you for allowing me to participate in your care, Brian Guiles, DO 05/09/2022, 3:19 PM PGY-2, [place]

## 2022-05-10 ENCOUNTER — Telehealth: Payer: Self-pay

## 2022-05-10 DIAGNOSIS — A0472 Enterocolitis due to Clostridium difficile, not specified as recurrent: Secondary | ICD-10-CM

## 2022-05-10 NOTE — Assessment & Plan Note (Addendum)
Despite recurrence of diarrhea, this continues to improve and I suspect more related to dietary reasons rather than full recurrence of C. Diff diarrhea. Exam unremarkable, vitals stable. No abx at this time. Monitor closely.  Discussed return precautions (ie. >10 loose stools/day). Patient amenable to plan.

## 2022-05-10 NOTE — Telephone Encounter (Signed)
Patient LVM on nurse line regarding continued issues with diarrhea.   Reports that he has had at least 10 episodes of soft stools within the last 10 hours.   Attempted to return call to patient. He did not answer, LVM asking him to return call to office.   Talbot Grumbling, RN

## 2022-05-11 NOTE — Telephone Encounter (Signed)
Patient returns call to nurse line. Reports that he had 12 soft stools from 0200-1200 yesterday, 05/10/22. Denies blood in stool or watery diarrhea. No fever. Tmax: 99.7  He only ate an english muffin and banana yesterday. He continues to drink plenty of fluids.   Today he has only had one bowel movement.   Patient states that Dr. Madison Hickman wanted him to call back if he continued to have more than 10 loose stools per day.   Please advise next steps for patient.   Talbot Grumbling, RN

## 2022-05-12 ENCOUNTER — Encounter (HOSPITAL_BASED_OUTPATIENT_CLINIC_OR_DEPARTMENT_OTHER): Payer: Medicare (Managed Care) | Admitting: Internal Medicine

## 2022-05-12 ENCOUNTER — Telehealth: Payer: Self-pay

## 2022-05-12 DIAGNOSIS — L97822 Non-pressure chronic ulcer of other part of left lower leg with fat layer exposed: Secondary | ICD-10-CM | POA: Diagnosis not present

## 2022-05-12 DIAGNOSIS — L97912 Non-pressure chronic ulcer of unspecified part of right lower leg with fat layer exposed: Secondary | ICD-10-CM

## 2022-05-12 MED ORDER — FIDAXOMICIN 200 MG PO TABS: 200.0000 mg | ORAL_TABLET | Freq: Two times a day (BID) | ORAL | 0 refills | Status: DC

## 2022-05-12 NOTE — Telephone Encounter (Signed)
Patient notified of results.

## 2022-05-12 NOTE — Progress Notes (Signed)
KEYAN, FOLSON ([id number]) Visit Report for 05/12/2022 Arrival Information Details Patient Name: Date of Service: Brian Leon, [name] 05/12/2022 8:15 A M Medical Record Number: [id number] Patient Account Number: [id number] Date of Birth/Sex: Treating RN: 1946/07/17 (76 y.o. [name], [name] Primary Care Tenika Keeran: [name] Other Clinician: Referring Nichalos Brenton: Treating Ford Peddie/Extender: [name] Weeks in Treatment: 73 Visit Information History Since Last Visit Added or deleted any medications: No Patient Arrived: Ambulatory Any new allergies or adverse reactions: No Arrival Time: 08:21 Had a fall or experienced change in No Accompanied By: self activities of daily living that may affect Transfer Assistance: None risk of falls: Patient Identification Verified: Yes Signs or symptoms of abuse/neglect since last visito No Secondary Verification Process Completed: Yes Hospitalized since last visit: No Patient Requires Transmission-Based Precautions: No Implantable device outside of the clinic excluding No Patient Has Alerts: Yes cellular tissue based products placed in the center Patient Alerts: 11/02/21 L ABI: 1.37 since last visit: 11/02/2021 L TBI 0.77 Has Dressing in Place as Prescribed: Yes 11/02/2021 R TBI 0.69 Has Compression in Place as Prescribed: Yes 11/02/2021 R ABI 1.3 Pain Present Now: No Electronic Signature(s) Signed: 05/12/2022 5:06:20 PM By: Deon Pilling RN, BSN Entered By: Deon Pilling on 05/12/2022 08:22:36 -------------------------------------------------------------------------------- Compression Therapy Details Patient Name: Date of Service: Brian Leon, RO NN P. 05/12/2022 8:15 A M Medical Record Number: [id number] Patient Account Number: [id number] Date of Birth/Sex: Treating RN: 04/29/1946 (76 y.o. [name] Primary Care Symon Norwood: [name] Other Clinician: Referring Chasady Longwell: Treating Catriona Dillenbeck/Extender: [name] Weeks in Treatment: 26 Compression Therapy Performed for Wound Assessment: Wound #2 Left,Anterior Lower Leg Performed By: Clinician Deon Pilling, RN Compression Type: Three Layer Post Procedure Diagnosis Same as Pre-procedure Electronic Signature(s) Signed: 05/12/2022 5:06:20 PM By: Deon Pilling RN, BSN Entered By: Deon Pilling on 05/12/2022 08:34:52 -------------------------------------------------------------------------------- Compression Therapy Details Patient Name: Date of Service: Brian Leon, RO NN P. 05/12/2022 8:15 A M Medical Record Number: [id number] Patient Account Number: [id number] Date of Birth/Sex: Treating RN: 05/02/1946 (76 y.o. [name] Primary Care Helene Bernstein: [name] Other Clinician: Referring Tawanda Schall: Treating Rory Xiang/Extender: [name] Weeks in Treatment: 26 Compression Therapy Performed for Wound Assessment: Wound #4 Right,Anterior Lower Leg Performed By: Clinician Deon Pilling, RN Compression Type: Three Layer Post Procedure Diagnosis Same as Pre-procedure Electronic Signature(s) Signed: 05/12/2022 5:06:20 PM By: Deon Pilling RN, BSN Entered By: Deon Pilling on 05/12/2022 08:34:52 -------------------------------------------------------------------------------- Encounter Discharge Information Details Patient Name: Date of Service: Brian Leon, RO NN P. 05/12/2022 8:15 A M Medical Record Number: [id number] Patient Account Number: [id number] Date of Birth/Sex: Treating RN: August 25, 1946 (76 y.o. [name] Primary Care Antonae Zbikowski: [name] Other Clinician: Referring Yesli Vanderhoff: Treating Mieka Leaton/Extender: [name] Weeks in Treatment: 34 Encounter Discharge Information Items Discharge Condition: Stable Ambulatory Status: Ambulatory Discharge Destination: Home Transportation: Private Auto Accompanied By: self Schedule Follow-up Appointment: Yes Clinical Summary of  Care: Electronic Signature(s) Signed: 05/12/2022 5:06:20 PM By: Deon Pilling RN, BSN Entered By: Deon Pilling on 05/12/2022 09:01:38 -------------------------------------------------------------------------------- Lower Extremity Assessment Details Patient Name: Date of Service: Brian Leon, RO NN P. 05/12/2022 8:15 A M Medical Record Number: [id number] Patient Account Number: [id number] Date of Birth/Sex: Treating RN: 06-11-1946 (76 y.o. [name] Primary Care Jewel Venditto: [name] Other Clinician: Referring Lilyanna Lunt: Treating Jaskirat Schwieger/Extender: Bebe Shaggy, JA CKY Weeks in Treatment: 26 Edema Assessment Assessed: [Left: Yes] [  Right: Yes] Edema: [Left: No] [Right: No] Calf Left: Right: Point of Measurement: 37 cm From Medial Instep 28 cm 27.5 cm Ankle Left: Right: Point of Measurement: 12 cm From Medial Instep 19 cm 19 cm Vascular Assessment Pulses: Dorsalis Pedis Palpable: [Left:Yes] [Right:Yes] Electronic Signature(s) Signed: 05/12/2022 5:06:20 PM By: Deon Pilling RN, BSN Entered By: Deon Pilling on 05/12/2022 08:25:40 -------------------------------------------------------------------------------- Multi Wound Chart Details Patient Name: Date of Service: Brian Leon, RO NN P. 05/12/2022 8:15 A M Medical Record Number: [id number] Patient Account Number: [id number] Date of Birth/Sex: Treating RN: Jun 04, 1946 (76 y.o. [name] Primary Care Spence Soberano: [name] Other Clinician: Referring Jihan Rudy: Treating Ave Scharnhorst/Extender: [name] Weeks in Treatment: 26 Vital Signs Height(in): 68 Pulse(bpm): 27 Weight(lbs): 140 Blood Pressure(mmHg): 126/76 Body Mass Index(BMI): 21.3 Temperature(F): 98.1 Respiratory Rate(breaths/min): 20 Photos: [N/A:N/A] Left, Anterior Lower Leg Right, Anterior Lower Leg N/A Wound Location: Trauma Trauma N/A Wounding Event: Abrasion Abrasion N/A Primary Etiology: Asthma, Hypertension,  Hepatitis A, Asthma, Hypertension, Hepatitis A, N/A Comorbid History: Rheumatoid Arthritis Rheumatoid Arthritis 01/04/2022 01/04/2022 N/A Date Acquired: 14 14 N/A Weeks of Treatment: Open Open N/A Wound Status: No No N/A Wound Recurrence: 0.5x0.6x0.1 1x1.2x0.1 N/A Measurements L x W x D (cm) 0.236 0.942 N/A A (cm) : rea 0.024 0.094 N/A Volume (cm) : 44.30% -185.50% N/A % Reduction in Area: 42.90% -184.80% N/A % Reduction in Volume: Full Thickness Without Exposed Full Thickness Without Exposed N/A Classification: Support Structures Support Structures Medium Medium N/A Exudate Amount: Serosanguineous Serosanguineous N/A Exudate Type: red, brown red, brown N/A Exudate Color: Distinct, outline attached Distinct, outline attached N/A Wound Margin: Large (67-100%) Large (67-100%) N/A Granulation Amount: Pink Pink N/A Granulation Quality: Small (1-33%) Small (1-33%) N/A Necrotic Amount: Fat Layer (Subcutaneous Tissue): Yes Fat Layer (Subcutaneous Tissue): Yes N/A Exposed Structures: Fascia: No Fascia: No Tendon: No Tendon: No Muscle: No Muscle: No Joint: No Joint: No Bone: No Bone: No Large (67-100%) Medium (34-66%) N/A Epithelialization: N/A Debridement - Excisional N/A Debridement: Pre-procedure Verification/Time Out N/A 08:55 N/A Taken: N/A Lidocaine 4% Topical Solution N/A Pain Control: N/A Subcutaneous, Slough N/A Tissue Debrided: N/A Skin/Subcutaneous Tissue N/A Level: N/A 1.2 N/A Debridement A (sq cm): rea N/A Curette N/A Instrument: N/A Minimum N/A Bleeding: N/A Pressure N/A Hemostasis A chieved: N/A 0 N/A Procedural Pain: N/A 0 N/A Post Procedural Pain: N/A Procedure was tolerated well N/A Debridement Treatment Response: N/A 1x1.2x0.1 N/A Post Debridement Measurements L x W x D (cm) N/A 0.094 N/A Post Debridement Volume: (cm) Compression Therapy Compression Therapy N/A Procedures Performed: Debridement Treatment Notes Wound #2  (Lower Leg) Wound Laterality: Left, Anterior Cleanser Soap and Water Discharge Instruction: May shower and wash wound with dial antibacterial soap and water prior to dressing change. Peri-Wound Care Sween Lotion (Moisturizing lotion) Discharge Instruction: Apply moisturizing lotion as directed Topical Primary Dressing FIBRACOL Plus Dressing, 2x2 in (collagen) Discharge Instruction: Moisten collagen with saline or hydrogel Secondary Dressing ABD Pad, 8x10 Discharge Instruction: Apply over primary dressing as directed. Secured With Compression Wrap ThreePress (3 layer compression wrap) Discharge Instruction: Apply three layer compression as directed. Compression Stockings Add-Ons Wound #4 (Lower Leg) Wound Laterality: Right, Anterior Cleanser Soap and Water Discharge Instruction: May shower and wash wound with dial antibacterial soap and water prior to dressing change. Peri-Wound Care Sween Lotion (Moisturizing lotion) Discharge Instruction: Apply moisturizing lotion as directed Topical Primary Dressing FIBRACOL Plus Dressing, 2x2 in (collagen) Discharge Instruction: Moisten collagen with saline or hydrogel Secondary Dressing ABD Pad, 8x10 Discharge Instruction:  Apply over primary dressing as directed. Secured With Compression Wrap ThreePress (3 layer compression wrap) Discharge Instruction: Apply three layer compression as directed. Compression Stockings Add-Ons Electronic Signature(s) Signed: 05/12/2022 10:24:46 AM By: Kalman Shan DO Signed: 05/12/2022 5:06:20 PM By: Deon Pilling RN, BSN Entered By: Kalman Shan on 05/12/2022 10:07:48 -------------------------------------------------------------------------------- Multi-Disciplinary Care Plan Details Patient Name: Date of Service: Brian Leon, RO NN P. 05/12/2022 8:15 A M Medical Record Number: [id number] Patient Account Number: [id number] Date of Birth/Sex: Treating RN: 1946-06-28 (76 y.o. [name] Primary Care Tracy Gerken: [name] Other Clinician: Referring Jerrold Haskell: Treating Flower Franko/Extender: [name] Weeks in Treatment: 26 Active Inactive Necrotic Tissue Nursing Diagnoses: Impaired tissue integrity related to necrotic/devitalized tissue Knowledge deficit related to management of necrotic/devitalized tissue Goals: Necrotic/devitalized tissue will be minimized in the wound bed Date Initiated: 02/03/2022 Target Resolution Date: 06/03/2022 Goal Status: Active Patient/caregiver will verbalize understanding of reason and process for debridement of necrotic tissue Date Initiated: 02/03/2022 Date Inactivated: 03/24/2022 Target Resolution Date: 04/01/2022 Goal Status: Met Interventions: Assess patient pain level pre-, during and post procedure and prior to discharge Provide education on necrotic tissue and debridement process Treatment Activities: Apply topical anesthetic as ordered : 02/03/2022 Excisional debridement : 02/03/2022 Notes: Electronic Signature(s) Signed: 05/12/2022 5:06:20 PM By: Deon Pilling RN, BSN Entered By: Deon Pilling on 05/12/2022 08:38:27 -------------------------------------------------------------------------------- Pain Assessment Details Patient Name: Date of Service: Brian Leon, RO NN P. 05/12/2022 8:15 A M Medical Record Number: [id number] Patient Account Number: [id number] Date of Birth/Sex: Treating RN: 1945-11-02 (76 y.o. [name] Primary Care Nigel Wessman: [name] Other Clinician: Referring Shadoe Bethel: Treating Orvile Corona/Extender: [name] Weeks in Treatment: 26 Active Problems Location of Pain Severity and Description of Pain Patient Has Paino No Site Locations Rate the pain. Current Pain Level: 0 Pain Management and Medication Current Pain Management: Medication: No Cold Application: No Rest: No Massage: No Activity: No T.E.N.S.: No Heat Application: No Leg drop or elevation:  No Is the Current Pain Management Adequate: Adequate How does your wound impact your activities of daily livingo Sleep: No Bathing: No Appetite: No Relationship With Others: No Bladder Continence: No Emotions: No Bowel Continence: No Work: No Toileting: No Drive: No Dressing: No Hobbies: No Engineer, maintenance) Signed: 05/12/2022 5:06:20 PM By: Deon Pilling RN, BSN Entered By: Deon Pilling on 05/12/2022 08:22:56 -------------------------------------------------------------------------------- Patient/Caregiver Education Details Patient Name: Date of Service: Brian Leon, RO NN P. 8/10/2023andnbsp8:15 A M Medical Record Number: [id number] Patient Account Number: [id number] Date of Birth/Gender: Treating RN: April 05, 1946 (76 y.o. [name] Primary Care Physician: [name] Other Clinician: Referring Physician: Treating Physician/Extender: [name] Weeks in Treatment: 33 Education Assessment Education Provided To: Patient Education Topics Provided Wound Debridement: Handouts: Wound Debridement Methods: Explain/Verbal Responses: Reinforcements needed Electronic Signature(s) Signed: 05/12/2022 5:06:20 PM By: Deon Pilling RN, BSN Entered By: Deon Pilling on 05/12/2022 08:38:36 -------------------------------------------------------------------------------- Wound Assessment Details Patient Name: Date of Service: Brian Leon, RO NN P. 05/12/2022 8:15 A M Medical Record Number: [id number] Patient Account Number: [id number] Date of Birth/Sex: Treating RN: Sep 07, 1946 (77 y.o. [name] Primary Care Loras Grieshop: [name] Other Clinician: Referring Yasmene Salomone: Treating Dayla Gasca/Extender: [name] Weeks in Treatment: 26 Wound Status Wound Number: 2 Primary Etiology: Abrasion Wound Location: Left, Anterior Lower Leg Wound Status: Open Wounding Event: Trauma Comorbid History: Asthma, Hypertension, Hepatitis A,  Rheumatoid Arthritis Date Acquired: 01/04/2022 Weeks Of Treatment: 14 Clustered Wound:  No Photos Wound Measurements Length: (cm) 0.5 Width: (cm) 0.6 Depth: (cm) 0.1 Area: (cm) 0.236 Volume: (cm) 0.024 % Reduction in Area: 44.3% % Reduction in Volume: 42.9% Epithelialization: Large (67-100%) Tunneling: No Undermining: No Wound Description Classification: Full Thickness Without Exposed Support Structures Wound Margin: Distinct, outline attached Exudate Amount: Medium Exudate Type: Serosanguineous Exudate Color: red, brown Foul Odor After Cleansing: No Slough/Fibrino Yes Wound Bed Granulation Amount: Large (67-100%) Exposed Structure Granulation Quality: Pink Fascia Exposed: No Necrotic Amount: Small (1-33%) Fat Layer (Subcutaneous Tissue) Exposed: Yes Necrotic Quality: Adherent Slough Tendon Exposed: No Muscle Exposed: No Joint Exposed: No Bone Exposed: No Treatment Notes Wound #2 (Lower Leg) Wound Laterality: Left, Anterior Cleanser Soap and Water Discharge Instruction: May shower and wash wound with dial antibacterial soap and water prior to dressing change. Peri-Wound Care Sween Lotion (Moisturizing lotion) Discharge Instruction: Apply moisturizing lotion as directed Topical Primary Dressing FIBRACOL Plus Dressing, 2x2 in (collagen) Discharge Instruction: Moisten collagen with saline or hydrogel Secondary Dressing ABD Pad, 8x10 Discharge Instruction: Apply over primary dressing as directed. Secured With Compression Wrap ThreePress (3 layer compression wrap) Discharge Instruction: Apply three layer compression as directed. Compression Stockings Add-Ons Electronic Signature(s) Signed: 05/12/2022 5:06:20 PM By: Deon Pilling RN, BSN Entered By: Deon Pilling on 05/12/2022 08:32:53 -------------------------------------------------------------------------------- Wound Assessment Details Patient Name: Date of Service: Brian Leon, RO NN P. 05/12/2022 8:15 A  M Medical Record Number: [id number] Patient Account Number: [id number] Date of Birth/Sex: Treating RN: 10-01-1946 (76 y.o. [name] Primary Care Renia Mikelson: [name] Other Clinician: Referring Corene Resnick: Treating Defne Gerling/Extender: [name] Weeks in Treatment: 26 Wound Status Wound Number: 4 Primary Etiology: Abrasion Wound Location: Right, Anterior Lower Leg Wound Status: Open Wounding Event: Trauma Comorbid History: Asthma, Hypertension, Hepatitis A, Rheumatoid Arthritis Date Acquired: 01/04/2022 Weeks Of Treatment: 14 Clustered Wound: No Photos Wound Measurements Length: (cm) 1 Width: (cm) 1.2 Depth: (cm) 0.1 Area: (cm) 0.942 Volume: (cm) 0.094 % Reduction in Area: -185.5% % Reduction in Volume: -184.8% Epithelialization: Medium (34-66%) Tunneling: No Undermining: No Wound Description Classification: Full Thickness Without Exposed Support Structures Wound Margin: Distinct, outline attached Exudate Amount: Medium Exudate Type: Serosanguineous Exudate Color: red, brown Foul Odor After Cleansing: No Slough/Fibrino Yes Wound Bed Granulation Amount: Large (67-100%) Exposed Structure Granulation Quality: Pink Fascia Exposed: No Necrotic Amount: Small (1-33%) Fat Layer (Subcutaneous Tissue) Exposed: Yes Necrotic Quality: Adherent Slough Tendon Exposed: No Muscle Exposed: No Joint Exposed: No Bone Exposed: No Treatment Notes Wound #4 (Lower Leg) Wound Laterality: Right, Anterior Cleanser Soap and Water Discharge Instruction: May shower and wash wound with dial antibacterial soap and water prior to dressing change. Peri-Wound Care Sween Lotion (Moisturizing lotion) Discharge Instruction: Apply moisturizing lotion as directed Topical Primary Dressing FIBRACOL Plus Dressing, 2x2 in (collagen) Discharge Instruction: Moisten collagen with saline or hydrogel Secondary Dressing ABD Pad, 8x10 Discharge Instruction: Apply over primary  dressing as directed. Secured With Compression Wrap ThreePress (3 layer compression wrap) Discharge Instruction: Apply three layer compression as directed. Compression Stockings Add-Ons Electronic Signature(s) Signed: 05/12/2022 5:06:20 PM By: Deon Pilling RN, BSN Entered By: Deon Pilling on 05/12/2022 08:33:08 -------------------------------------------------------------------------------- Vitals Details Patient Name: Date of Service: Brian Leon, RO NN P. 05/12/2022 8:15 A M Medical Record Number: [id number] Patient Account Number: [id number] Date of Birth/Sex: Treating RN: [birth year] (76 y.o. [name] Primary Care Murriel Holwerda: [name] Other Clinician: Referring Shon Indelicato: Treating Shayle Donahoo/Extender: Bebe Shaggy, JA CKY Weeks in Treatment: 26 Vital Signs Time Taken: 08:20 Temperature (  F): 98.1 Height (in): 68 Pulse (bpm): 71 Weight (lbs): 140 Respiratory Rate (breaths/min): 20 Body Mass Index (BMI): 21.3 Blood Pressure (mmHg): 126/76 Reference Range: 80 - 120 mg / dl Electronic Signature(s) Signed: 05/12/2022 5:06:20 PM By: Deon Pilling RN, BSN Entered By: Deon Pilling on 05/12/2022 08:22:48

## 2022-05-12 NOTE — Progress Notes (Signed)
Brian, Leon ([id number]) Visit Report for 05/12/2022 Chief Complaint Document Details Patient Name: Date of Service: Brian Leon, [name] 05/12/2022 8:15 A M Medical Record Number: [id number] Patient Account Number: [id number] Date of Birth/Sex: Treating RN: 06-19-1946 (76 y.o. Brian Leon Primary Care Provider: Deland Leon Other Clinician: Referring Provider: Treating Provider/Extender: Brian Leon Weeks in Treatment: 43 Information Obtained from: Patient Chief Complaint 11/11/2021; Left lower extremity wound due to trauma 02/03/2022; bilateral lower extremity wounds due to trauma Electronic Signature(s) Signed: 05/12/2022 10:24:46 AM By: Kalman Shan DO Entered By: Kalman Shan on 05/12/2022 10:07:55 -------------------------------------------------------------------------------- Debridement Details Patient Name: Date of Service: Brian Leon, RO NN P. 05/12/2022 8:15 A M Medical Record Number: [id number] Patient Account Number: [id number] Date of Birth/Sex: Treating RN: Jan 17, 1946 (76 y.o. Brian Leon, Brian Leon Primary Care Provider: Deland Leon Other Clinician: Referring Provider: Treating Provider/Extender: Brian Leon Weeks in Treatment: 26 Debridement Performed for Assessment: Wound #4 Right,Anterior Lower Leg Performed By: Physician Kalman Shan, DO Debridement Type: Debridement Level of Consciousness (Pre-procedure): Awake and Alert Pre-procedure Verification/Time Out Yes - 08:55 Taken: Start Time: 08:56 Pain Control: Lidocaine 4% T opical Solution T Area Debrided (L x W): otal 1 (cm) x 1.2 (cm) = 1.2 (cm) Tissue and other material debrided: Viable, Non-Viable, Slough, Subcutaneous, Slough Level: Skin/Subcutaneous Tissue Debridement Description: Excisional Instrument: Curette Bleeding: Minimum Hemostasis Achieved: Pressure End Time: 09:03 Procedural Pain: 0 Post Procedural Pain: 0 Response to Treatment: Procedure was  tolerated well Level of Consciousness (Post- Awake and Alert procedure): Post Debridement Measurements of Total Wound Length: (cm) 1 Width: (cm) 1.2 Depth: (cm) 0.1 Volume: (cm) 0.094 Character of Wound/Ulcer Post Debridement: Improved Post Procedure Diagnosis Same as Pre-procedure Electronic Signature(s) Signed: 05/12/2022 10:24:46 AM By: Kalman Shan DO Signed: 05/12/2022 5:06:20 PM By: Brian Pilling RN, BSN Entered By: Brian Leon on 05/12/2022 09:03:17 -------------------------------------------------------------------------------- HPI Details Patient Name: Date of Service: Brian Leon, RO NN P. 05/12/2022 8:15 A M Medical Record Number: [id number] Patient Account Number: [id number] Date of Birth/Sex: Treating RN: 1945/10/18 (76 y.o. Brian Leon Primary Care Provider: Deland Leon Other Clinician: Referring Provider: Treating Provider/Extender: Brian Leon Weeks in Treatment: 26 History of Present Illness HPI Description: Admission 11/11/2021 [name] is a 76 year old male with a past medical history of polymyalgia rheumatica that presents to the clinic for a 56-monthhistory of nonhealing wound to the left lower extremity. He states that on Halloween he hit an object and created a wound that has not healed. He states he has seen dermatology and his primary care physician for this issue. On one occasion he had an UHaematologist He has also used Xeroform. It does not sound like he uses any kind of wound dressing consistently. He currently reports chronic pain to the wound site. He denies systemic signs of infection. He denies purulent drainage. 2/16; patient presents for follow-up. He has been using Hydrofera Blue and gentamicin ointment to the wound bed without issues. He reports improvement in wound healing. He currently denies signs of infection. 2/23; patient presents for follow-up. He continues to use Hydrofera Blue and gentamicin ointment to the  wound bed without issues. He denies signs of infection. He reports improvement in his chronic pain to the wound bed. 3/2; patient presents for follow-up. He has been using Hydrofera Blue and gentamicin to the wound bed without issues. [place]  He currently denies signs of infection. 3/24; patient presents for follow-up. He has been in [place] for the past 3 weeks for vacation. He enjoyed his trip. He reports no issues today. He has been using Hydrofera Blue and gentamicin ointment to the wound beds without issues. 4/7; patient presents for follow-up. He has been using Hydrofera Blue to the wound bed without issues. He reports that his wound is healed. 02/03/2022; patient was discharged 1 month ago with a closed wound to the left lower extremity. Unfortunately over the past month he has developed 3 new wounds he states was caused by hitting his legs against different objects. He has been keeping the areas covered with DuoDERM. He reports mild tenderness to the right lower extremity wound bed with increased redness. He denies purulent drainage. He states he has his previous wound care supplies of Hydrofera Blue and gentamicin ointment. 5/11; patient presents for follow-up. He had a PCR culture done at last clinic visit that showed high levels of Staph aureus and viridans and low levels of coagulase-negative staph. I started him on Augmentin at last clinic visit but he reports continued pain and erythema to the wound sites. He has been using gentamicin ointment and Hydrofera Blue with dressing changes. He also presents with a 100.2 temp cough and diarrhea. He has not taken anything for his symptoms. 5/18; patient presents for follow-up. He received his Keystone antibiotics and started using this. Unfortunately he mixed it incorrectly and it turned into a hardened substance. He had to use his refill to start all over again. He has only been using this for 1  day. He states he is combining this correctly now. He completed his course of clindamycin. He denies signs of infection. 5/25; patient presents for follow-up. He has been using Keystone antibiotics with Sorbact. He reports more serous drainage from the left lower extremity. Other than that he has no issues or complaints. He denies signs of infection. 6/1; patient presents for follow-up. We switched to Iodoflex and Keystone antibiotic under compression therapy at last clinic visit. He tolerated this treatment well. He has no issues or complaints today. He denies signs of infection. Unfortunately he will be out of town for the next 3 weeks. 6/22; patient presents for follow-up. He was in [place] for the past 3 weeks. He has been using compression stockings daily along with [facility] antibiotic and Hydrofera Blue. Unfortunately he developed a skin tear to his right arm after hitting a door. 6/29; patient presents for follow-up. We have been using Keystone antibiotics with PolyMem silver to the lower extremities under 3 layer compression. Apparently he has a skin tear to the lateral Aspect of the right leg that he states was there previously. I had not seen it before. His skin tear on his right arm is well-healing with the use of bacitracin. He has no issues or complaints today. 7/6; patient presents for follow-up. We have been using Keystone antibiotics with PolyMem silver to the lower extremities under 3 layer compression. And antibiotic ointment to the right lateral leg wound. Patient reports improvement in wound healing. 7/13; patient presents for follow-up. We have been using Keystone antibiotics and PolyMem silver to all the wound beds. Patient denies signs of infection. He tolerated the compression wraps well. He is currently being treated for C. difficile with vancomycin. 7/20; patient presents for follow-up. We have been using Keystone antibiotic and PolyMem silver to all wound beds. He continues  to do well with compression wraps bilaterally. He has  no issues or complaints today. 7/27; patient presents for follow-up. We have been using Keystone antibiotic and PolyMem silver to the wound beds under 3 layer compression. The right medial wound has healed. He has 2 remaining wounds. He denies signs of infection. 8/3; patient presents for follow-up. We have been using collagen under 3 layer compression. He has no issues or complaints today. 8/10; patient presents for follow-up. We have been using collagen under 3 layer compression. He has no issues or complaints today. Electronic Signature(s) Signed: 05/12/2022 10:24:46 AM By: Kalman Shan DO Entered By: Kalman Shan on 05/12/2022 10:08:39 -------------------------------------------------------------------------------- Physical Exam Details Patient Name: Date of Service: Brian Leon, RO NN P. 05/12/2022 8:15 A M Medical Record Number: [id number] Patient Account Number: [id number] Date of Birth/Sex: Treating RN: December 12, 1945 (76 y.o. Brian Leon Primary Care Provider: Deland Leon Other Clinician: Referring Provider: Treating Provider/Extender: Brian Leon Weeks in Treatment: 26 Constitutional respirations regular, non-labored and within target range for patient.. Cardiovascular 2+ dorsalis pedis/posterior tibialis pulses. Psychiatric pleasant and cooperative. Notes Left lower extremity: T the anterior aspect there is a small open wound with fibrinous tissue and granulation tissue. o Right lower extremity: T the anterior distal aspect there is an open wound with granulation tissue, fibrinous tissue and slough. No surrounding signs of infection o to any of the wound beds. Good edema control. Electronic Signature(s) Signed: 05/12/2022 10:24:46 AM By: Kalman Shan DO Entered By: Kalman Shan on 05/12/2022  10:10:22 -------------------------------------------------------------------------------- Physician Orders Details Patient Name: Date of Service: Brian Leon, RO NN P. 05/12/2022 8:15 A M Medical Record Number: [id number] Patient Account Number: [id number] Date of Birth/Sex: Treating RN: 02/26/1946 (76 y.o. Brian Leon, Brian Leon Primary Care Provider: Deland Leon Other Clinician: Referring Provider: Treating Provider/Extender: Brian Leon Weeks in Treatment: 63 Verbal / Phone Orders: No Diagnosis Coding ICD-10 Coding Code Description [phone] Non-pressure chronic ulcer of other part of left lower leg with fat layer exposed L97.912 Non-pressure chronic ulcer of unspecified part of right lower leg with fat layer exposed S41.101A Unspecified open wound of right upper arm, initial encounter T14.90XA Injury, unspecified, initial encounter M35.3 Polymyalgia rheumatica I87.2 Venous insufficiency (chronic) (peripheral) Follow-up Appointments ppointment in 2 weeks. - Dr. Heber [place] [facility] [place], Room 8 Thursday 0815 05/26/2022 Return A Nurse Visit: - Thursday 0815 05/19/2022 [place], Room 8 Anesthetic Wound #2 Left,Anterior Lower Leg Topical Lidocaine 4% added to wound bed Wound #4 Right,Anterior Lower Leg Topical Lidocaine 4% added to wound bed Cellular or Tissue Based Products Cellular or Tissue Based Product Type: - ran insurance authorization for Epifix approved. would be cost out of pocket. Bathing/ Shower/ Hygiene May shower with protection but do not get wound dressing(s) wet. Edema Control - Lymphedema / SCD / Other Elevate legs to the level of the heart or above for 30 minutes daily and/or when sitting, a frequency of: - 3-4 times a day throughout the day. Avoid standing for long periods of time. Exercise regularly Moisturize legs daily. - every night before bed. do not get directly on wounds Wound Treatment Wound #2 - Lower Leg Wound Laterality: Left, Anterior Cleanser:  Soap and Water 1 x Per Week/30 Days Discharge Instructions: May shower and wash wound with dial antibacterial soap and water prior to dressing change. Peri-Wound Care: Sween Lotion (Moisturizing lotion) 1 x Per Week/30 Days Discharge Instructions: Apply moisturizing lotion as directed Prim Dressing: FIBRACOL Plus Dressing, 2x2 in (collagen) 1 x Per Week/30 Days ary Discharge Instructions: Moisten  collagen with saline or hydrogel Secondary Dressing: ABD Pad, 8x10 1 x Per Week/30 Days Discharge Instructions: Apply over primary dressing as directed. Compression Wrap: ThreePress (3 layer compression wrap) 1 x Per Week/30 Days Discharge Instructions: Apply three layer compression as directed. Wound #4 - Lower Leg Wound Laterality: Right, Anterior Cleanser: Soap and Water 1 x Per Week/30 Days Discharge Instructions: May shower and wash wound with dial antibacterial soap and water prior to dressing change. Peri-Wound Care: Sween Lotion (Moisturizing lotion) 1 x Per Week/30 Days Discharge Instructions: Apply moisturizing lotion as directed Prim Dressing: FIBRACOL Plus Dressing, 2x2 in (collagen) 1 x Per Week/30 Days ary Discharge Instructions: Moisten collagen with saline or hydrogel Secondary Dressing: ABD Pad, 8x10 1 x Per Week/30 Days Discharge Instructions: Apply over primary dressing as directed. Compression Wrap: ThreePress (3 layer compression wrap) 1 x Per Week/30 Days Discharge Instructions: Apply three layer compression as directed. Electronic Signature(s) Signed: 05/12/2022 10:24:46 AM By: Kalman Shan DO Entered By: Kalman Shan on 05/12/2022 10:10:33 -------------------------------------------------------------------------------- Problem List Details Patient Name: Date of Service: Brian Leon, RO NN P. 05/12/2022 8:15 A M Medical Record Number: [id number] Patient Account Number: [id number] Date of Birth/Sex: Treating RN: 27-May-1946 (76 y.o. Brian Leon, Brian Leon Primary Care  Provider: Other Clinician: Deland Leon Referring Provider: Treating Provider/Extender: Brian Leon Weeks in Treatment: 26 Active Problems ICD-10 Encounter Code Description Active Date MDM Diagnosis L97.822 Non-pressure chronic ulcer of other part of left lower leg with fat layer exposed2/06/2022 No Yes L97.912 Non-pressure chronic ulcer of unspecified part of right lower leg with fat layer 02/03/2022 No Yes exposed S41.101A Unspecified open wound of right upper arm, initial encounter 03/24/2022 No Yes T14.90XA Injury, unspecified, initial encounter 11/11/2021 No Yes M35.3 Polymyalgia rheumatica 11/11/2021 No Yes I87.2 Venous insufficiency (chronic) (peripheral) 02/24/2022 No Yes Inactive Problems Resolved Problems Electronic Signature(s) Signed: 05/12/2022 10:24:46 AM By: Kalman Shan DO Entered By: Kalman Shan on 05/12/2022 10:07:45 -------------------------------------------------------------------------------- Progress Note Details Patient Name: Date of Service: Brian Leon, RO NN P. 05/12/2022 8:15 A M Medical Record Number: [id number] Patient Account Number: [id number] Date of Birth/Sex: Treating RN: 17-Jan-1946 (76 y.o. Brian Leon Primary Care Provider: Deland Leon Other Clinician: Referring Provider: Treating Provider/Extender: Brian Leon Weeks in Treatment: 32 Subjective Chief Complaint Information obtained from Patient 11/11/2021; Left lower extremity wound due to trauma 02/03/2022; bilateral lower extremity wounds due to trauma History of Present Illness (HPI) Admission 11/11/2021 [name] is a 76 year old male with a past medical history of polymyalgia rheumatica that presents to the clinic for a 34-monthhistory of nonhealing wound to the left lower extremity. He states that on Halloween he hit an object and created a wound that has not healed. He states he has seen dermatology and his primary care physician for this  issue. On one occasion he had an UHaematologist He has also used Xeroform. It does not sound like he uses any kind of wound dressing consistently. He currently reports chronic pain to the wound site. He denies systemic signs of infection. He denies purulent drainage. 2/16; patient presents for follow-up. He has been using Hydrofera Blue and gentamicin ointment to the wound bed without issues. He reports improvement in wound healing. He currently denies signs of infection. 2/23; patient presents for follow-up. He continues to use Hydrofera Blue and gentamicin ointment to the wound bed without issues. He denies signs of infection. He reports improvement in his chronic pain to the wound bed.  3/2; patient presents for follow-up. He has been using Hydrofera Blue and gentamicin to the wound bed without issues. He reports he is going to [place] for the next 3 weeks. He currently denies signs of infection. 3/24; patient presents for follow-up. He has been in [place] for the past 3 weeks for vacation. He enjoyed his trip. He reports no issues today. He has been using Hydrofera Blue and gentamicin ointment to the wound beds without issues. 4/7; patient presents for follow-up. He has been using Hydrofera Blue to the wound bed without issues. He reports that his wound is healed. 02/03/2022; patient was discharged 1 month ago with a closed wound to the left lower extremity. Unfortunately over the past month he has developed 3 new wounds he states was caused by hitting his legs against different objects. He has been keeping the areas covered with DuoDERM. He reports mild tenderness to the right lower extremity wound bed with increased redness. He denies purulent drainage. He states he has his previous wound care supplies of Hydrofera Blue and gentamicin ointment. 5/11; patient presents for follow-up. He had a PCR culture done at last clinic visit that showed high levels of Staph aureus and viridans and low levels  of coagulase-negative staph. I started him on Augmentin at last clinic visit but he reports continued pain and erythema to the wound sites. He has been using gentamicin ointment and Hydrofera Blue with dressing changes. He also presents with a 100.2 temp cough and diarrhea. He has not taken anything for his symptoms. 5/18; patient presents for follow-up. He received his Keystone antibiotics and started using this. Unfortunately he mixed it incorrectly and it turned into a hardened substance. He had to use his refill to start all over again. He has only been using this for 1 day. He states he is combining this correctly now. He completed his course of clindamycin. He denies signs of infection. 5/25; patient presents for follow-up. He has been using Keystone antibiotics with Sorbact. He reports more serous drainage from the left lower extremity. Other than that he has no issues or complaints. He denies signs of infection. 6/1; patient presents for follow-up. We switched to Iodoflex and Keystone antibiotic under compression therapy at last clinic visit. He tolerated this treatment well. He has no issues or complaints today. He denies signs of infection. Unfortunately he will be out of town for the next 3 weeks. 6/22; patient presents for follow-up. He was in [place] for the past 3 weeks. He has been using compression stockings daily along with [facility] antibiotic and Hydrofera Blue. Unfortunately he developed a skin tear to his right arm after hitting a door. 6/29; patient presents for follow-up. We have been using Keystone antibiotics with PolyMem silver to the lower extremities under 3 layer compression. Apparently he has a skin tear to the lateral Aspect of the right leg that he states was there previously. I had not seen it before. His skin tear on his right arm is well-healing with the use of bacitracin. He has no issues or complaints today. 7/6; patient presents for follow-up. We have been using  Keystone antibiotics with PolyMem silver to the lower extremities under 3 layer compression. And antibiotic ointment to the right lateral leg wound. Patient reports improvement in wound healing. 7/13; patient presents for follow-up. We have been using Keystone antibiotics and PolyMem silver to all the wound beds. Patient denies signs of infection. He tolerated the compression wraps well. He is currently being treated for C. difficile  with vancomycin. 7/20; patient presents for follow-up. We have been using Keystone antibiotic and PolyMem silver to all wound beds. He continues to do well with compression wraps bilaterally. He has no issues or complaints today. 7/27; patient presents for follow-up. We have been using Keystone antibiotic and PolyMem silver to the wound beds under 3 layer compression. The right medial wound has healed. He has 2 remaining wounds. He denies signs of infection. 8/3; patient presents for follow-up. We have been using collagen under 3 layer compression. He has no issues or complaints today. 8/10; patient presents for follow-up. We have been using collagen under 3 layer compression. He has no issues or complaints today. Patient History Information obtained from Chart. Family History Cancer - Mother, No family history of Diabetes, Heart Disease, Hypertension, Kidney Disease, Lung Disease, Seizures, Stroke, Thyroid Problems, Tuberculosis. Social History Former smoker - quit 40 years ago, Marital Status - Married, Alcohol Use - Never, Drug Use - Current History - marijuana, Caffeine Use - Rarely. Medical History Eyes Denies history of Cataracts, Glaucoma, Optic Neuritis Ear/Nose/Mouth/Throat Denies history of Chronic sinus problems/congestion, Middle ear problems Hematologic/Lymphatic Denies history of Anemia, Hemophilia, Human Immunodeficiency Virus, Lymphedema, Sickle Cell Disease Respiratory Patient has history of Asthma Denies history of Aspiration, Chronic  Obstructive Pulmonary Disease (COPD), Pneumothorax, Sleep Apnea, Tuberculosis Cardiovascular Patient has history of Hypertension Denies history of Angina, Arrhythmia, Congestive Heart Failure, Coronary Artery Disease, Deep Vein Thrombosis, Hypotension, Myocardial Infarction, Peripheral Arterial Disease, Peripheral Venous Disease, Phlebitis, Vasculitis Gastrointestinal Patient has history of Hepatitis A Denies history of Cirrhosis , Colitis, Crohnoos, Hepatitis B, Hepatitis C Endocrine Denies history of Type I Diabetes, Type II Diabetes Genitourinary Denies history of End Stage Renal Disease Immunological Denies history of Lupus Erythematosus, Raynaudoos, Scleroderma Integumentary (Skin) Denies history of History of Burn Musculoskeletal Patient has history of Rheumatoid Arthritis Denies history of Gout, Osteoarthritis, Osteomyelitis Psychiatric Denies history of Anorexia/bulimia, Confinement Anxiety Hospitalization/Surgery History - 5 years ago Renal Cell carcinoma. 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 Vitals Time Taken: 8:20 AM, Height: 68 in, Weight: 140 lbs, BMI: 21.3, Temperature: 98.1 F, Pulse: 71 bpm, Respiratory Rate: 20 breaths/min, Blood Pressure: 126/76 mmHg. Cardiovascular 2+ dorsalis pedis/posterior tibialis pulses. Psychiatric pleasant and cooperative. General Notes: Left lower extremity: T the anterior aspect there is a small open  wound with fibrinous tissue and granulation tissue. Right lower extremity: T o o the anterior distal aspect there is an open wound with granulation tissue, fibrinous tissue and slough. No surrounding signs of infection to any of the wound beds. Good edema control. Integumentary (Hair, Skin) Wound #2 status is Open. Original cause of wound was Trauma. The date acquired was: 01/04/2022. The wound has been in treatment 14 weeks. The wound is located on the Left,Anterior Lower Leg. The wound measures 0.5cm length x 0.6cm width x 0.1cm depth; 0.236cm^2 area and 0.024cm^3 volume. There is Fat Layer (Subcutaneous Tissue) exposed. There is no tunneling or undermining noted. There is a medium amount of serosanguineous drainage noted. The wound margin is distinct with the outline attached to the wound base. There is large (67-100%) pink granulation within the wound bed. There is a  small (1-33%) amount of necrotic tissue within the wound bed including Adherent Slough. Wound #4 status is Open. Original cause of wound was Trauma. The date acquired was: 01/04/2022. The wound has been in treatment 14 weeks. The wound is located on the Right,Anterior Lower Leg. The wound measures 1cm length x 1.2cm width x 0.1cm depth; 0.942cm^2 area and 0.094cm^3 volume. There is Fat Layer (Subcutaneous Tissue) exposed. There is no tunneling or undermining noted. There is a medium amount of serosanguineous drainage noted. The wound margin is distinct with the outline attached to the wound base. There is large (67-100%) pink granulation within the wound bed. There is a small (1-33%) amount of necrotic tissue within the wound bed including Adherent Slough. 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 I debrided nonviable tissue. I recommended continuing the course with collagen under 3 layer compression. Follow-up in 1 week. Procedures Wound #4 Pre-procedure diagnosis of Wound #4 is an Abrasion located on the Right,Anterior Lower Leg . There was a Excisional Skin/Subcutaneous Tissue Debridement with a total area of 1.2 sq cm performed by Kalman Shan, DO. With the following instrument(s): Curette to remove Viable and Non-Viable tissue/material. Material removed includes Subcutaneous Tissue and Slough and after achieving pain control using Lidocaine 4% T opical Solution. A time out was conducted at 08:55, prior to the start of the procedure. A Minimum amount of bleeding was controlled with Pressure. The procedure was tolerated well with a pain level of 0 throughout and a pain level of 0 following the procedure. Post Debridement Measurements: 1cm length x 1.2cm width x 0.1cm depth; 0.094cm^3 volume. Character of Wound/Ulcer Post Debridement is improved. Post procedure Diagnosis Wound #4: Same as Pre-Procedure Pre-procedure diagnosis of Wound #4 is an Abrasion located on the Right,Anterior Lower Leg . There was a Three Layer Compression Therapy Procedure by Brian Pilling, RN. Post procedure Diagnosis Wound #4: Same as Pre-Procedure Wound #2 Pre-procedure diagnosis of Wound #2 is an Abrasion located on the Left,Anterior Lower Leg . There was a Three Layer Compression Therapy Procedure by Brian Pilling, RN. Post procedure Diagnosis Wound #2: Same as Pre-Procedure Plan Follow-up Appointments: Return Appointment in 2 weeks. - Dr. Heber [place] and [place], Room 8 Thursday 0815 05/26/2022 Nurse Visit: - Thursday 0815 05/19/2022 [place], Room 8 Anesthetic: Wound #2 Left,Anterior Lower Leg: Topical Lidocaine 4% added to  wound bed Wound #4 Right,Anterior Lower Leg: Topical Lidocaine 4% added to wound bed Cellular or Tissue Based Products: Cellular or Tissue Based Product Type: - ran insurance authorization for Epifix approved. would be cost out of pocket. Bathing/ Shower/ Hygiene: May shower with protection but do not get wound dressing(s) wet. Edema Control - Lymphedema / SCD / Other: Elevate legs to the level of the heart or above for 30 minutes daily and/or when sitting, a frequency of: - 3-4 times a day throughout the day. Avoid standing for long periods of time. Exercise regularly Moisturize legs daily. - every night before bed. do not get directly on wounds WOUND #2: - Lower Leg Wound Laterality: Left, Anterior Cleanser: Soap and Water 1 x Per Week/30 Days Discharge Instructions: May shower and wash wound with dial antibacterial soap and water  prior to dressing change. Peri-Wound Care: Sween Lotion (Moisturizing lotion) 1 x Per Week/30 Days Discharge Instructions: Apply moisturizing lotion as directed Prim Dressing: FIBRACOL Plus Dressing, 2x2 in (collagen) 1 x Per Week/30 Days ary Discharge Instructions: Moisten collagen with saline or hydrogel Secondary Dressing: ABD Pad, 8x10 1 x Per Week/30 Days Discharge Instructions: Apply over primary dressing as directed. Com pression Wrap: ThreePress (3 layer compression wrap) 1 x Per Week/30 Days Discharge Instructions: Apply three layer compression as directed. WOUND #4: - Lower Leg Wound Laterality: Right, Anterior Cleanser: Soap and Water 1 x Per Week/30 Days Discharge Instructions: May shower and wash wound with dial antibacterial soap and water prior to dressing change. Peri-Wound Care: Sween Lotion (Moisturizing lotion) 1 x Per Week/30 Days Discharge Instructions: Apply moisturizing lotion as directed Prim Dressing: FIBRACOL Plus Dressing, 2x2 in (collagen) 1 x Per Week/30 Days ary Discharge Instructions: Moisten collagen with saline or  hydrogel Secondary Dressing: ABD Pad, 8x10 1 x Per Week/30 Days Discharge Instructions: Apply over primary dressing as directed. Com pression Wrap: ThreePress (3 layer compression wrap) 1 x Per Week/30 Days Discharge Instructions: Apply three layer compression as directed. 1. In office sharp debridement 2. Collagen under 3 layer compression 3. Follow-up in 1 week Electronic Signature(s) Signed: 05/12/2022 10:24:46 AM By: Kalman Shan DO Entered By: Kalman Shan on 05/12/2022 10:11:13 -------------------------------------------------------------------------------- HxROS Details Patient Name: Date of Service: Brian Leon, RO NN P. 05/12/2022 8:15 A M Medical Record Number: [id number] Patient Account Number: [id number] Date of Birth/Sex: Treating RN: June 22, 1946 (76 y.o. Brian Leon Primary Care Provider: Deland Leon Other Clinician: Referring Provider: Treating Provider/Extender: Brian Leon Weeks in Treatment: 26 Information Obtained From Chart Eyes Medical History: Negative for: Cataracts; Glaucoma; Optic Neuritis Past Medical History Notes: Blind right eye WEARS PROSTHESIS Ear/Nose/Mouth/Throat Medical History: Negative for: Chronic sinus problems/congestion; Middle ear problems Past Medical History Notes: Allergic rhinitis Hematologic/Lymphatic Medical History: Negative for: Anemia; Hemophilia; Human Immunodeficiency Virus; Lymphedema; Sickle Cell Disease Respiratory Medical History: Positive for: Asthma Negative for: Aspiration; Chronic Obstructive Pulmonary Disease (COPD); Pneumothorax; Sleep Apnea; Tuberculosis Past Medical History Notes: Pneumonia Cardiovascular Medical History: Positive for: Hypertension Negative for: Angina; Arrhythmia; Congestive Heart Failure; Coronary Artery Disease; Deep Vein Thrombosis; Hypotension; Myocardial Infarction; Peripheral Arterial Disease; Peripheral Venous Disease; Phlebitis; Vasculitis Past  Medical History Notes: Thoracic aortic aneurysm Thrombocytosis Gastrointestinal Medical History: Positive for: Hepatitis A Negative for: Cirrhosis ; Colitis; Crohns; Hepatitis B; Hepatitis C Endocrine Medical History: Negative for: Type I Diabetes; Type II Diabetes Genitourinary Medical History: Negative for: End Stage Renal Disease Past Medical History Notes: 1/3kidney missing per patient-Renal Cell carcinoma Renal Cyst 5 years ago Immunological Medical History: Negative for: Lupus Erythematosus; Raynauds; Scleroderma Integumentary (Skin) Medical History: Negative for: History of Burn Musculoskeletal Medical History: Positive for: Rheumatoid Arthritis Negative for: Gout; Osteoarthritis; Osteomyelitis Past Medical History Notes: 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 Eczema Neurologic Medical History: Past Medical History Notes: Paraureteric diverticulum Oncologic Medical History: Past Medical History Notes: Renal Cell carcinoma- 5 years ago Psychiatric Medical History: Negative for: Anorexia/bulimia; Confinement Anxiety Immunizations Pneumococcal Vaccine: Received Pneumococcal Vaccination: Yes Received Pneumococcal Vaccination On or After 60th Birthday: Yes Implantable Devices No devices added Hospitalization / Surgery History Type of Hospitalization/Surgery 5 years ago Renal Cell carcinoma Family and Social History Cancer: Yes - Mother; Diabetes: No; Heart Disease: No; Hypertension: No; Kidney Disease: No; Lung Disease: No; Seizures: No; Stroke: No; Thyroid Problems: No; Tuberculosis: No; Former smoker - quit 40  years ago; Marital Status - Married; Alcohol Use: Never; Drug Use: Current History - marijuana; Caffeine Use: Rarely; Financial Concerns: No; Food, Clothing or Shelter Needs: No; Support System Lacking: No; Transportation Concerns: No Electronic Signature(s) Signed: 05/12/2022 10:24:46 AM By: Kalman Shan DO Signed: 05/12/2022 5:06:20 PM By: Brian Pilling RN, BSN Entered By: Kalman Shan on 05/12/2022 10:08:44 -------------------------------------------------------------------------------- SuperBill Details Patient Name: Date of Service: Brian Leon, RO NN P. 05/12/2022 Medical Record Number: [id number] Patient Account Number: [id number] Date of Birth/Sex: Treating RN: April 02, 1946 (76 y.o. 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 S41.101A Unspecified open wound of right upper arm, initial encounter T14.90XA Injury, unspecified, initial encounter M35.3 Polymyalgia rheumatica I87.2 Venous insufficiency (chronic) (peripheral) Facility Procedures CPT4 Code: [id number] Description: 06770 - DEB SUBQ TISSUE 20 SQ CM/< ICD-10 Diagnosis Description H40.352 Non-pressure chronic ulcer of unspecified part of right lower leg with fat layer exp Modifier: osed Quantity: 1 CPT4 Code: 48185909 Description: (Facility Use Only) 29581LT - [place] LWR LT LEG Modifier: 59 Quantity: 1 Physician Procedures : CPT4 Code Description Modifier [id number] 11042 - WC PHYS SUBQ TISS 20 SQ CM ICD-10 Diagnosis Description O46.950 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

## 2022-05-12 NOTE — Telephone Encounter (Signed)
-----   Message from Park Liter, MD sent at 05/10/2022 12:41 PM EDT ----- Benign arrhythmias noted on the monitor.  We will discuss during the visit

## 2022-05-12 NOTE — Addendum Note (Signed)
Addended by: Eustace Quail on: 05/12/2022 01:55 PM   Modules accepted: Orders

## 2022-05-12 NOTE — Telephone Encounter (Signed)
Patient returns call to nurse line. He reports that since yesterday afternoon, watery stools have returned.   Reports that he has had about 10 in the last 24 hours. He is continuing to drink plenty of fluids and denies blood in stool.   Patient is requesting to speak with provider regarding next steps.   He is requesting returned phone call at [phone].  Talbot Grumbling, RN

## 2022-05-12 NOTE — Telephone Encounter (Signed)
Called patient back to discuss watery stools and likely recurrence of C. difficile.  States he has been avoiding eating mostly because it increases the number of stools he has.  He is continuing to stay hydrated.  Rx for fidaxomicin 200 mg twice daily x 10 days.  Appointment made for 8/17 at 1050 with access to care.  Advised holding losartan, check electrolytes during upcoming appointment.  Patient amenable to plan and agreed that he would call back if having any further developments.

## 2022-05-19 ENCOUNTER — Encounter (HOSPITAL_BASED_OUTPATIENT_CLINIC_OR_DEPARTMENT_OTHER): Payer: Medicare (Managed Care) | Admitting: General Surgery

## 2022-05-19 ENCOUNTER — Ambulatory Visit (INDEPENDENT_AMBULATORY_CARE_PROVIDER_SITE_OTHER): Payer: PRIVATE HEALTH INSURANCE | Admitting: Family Medicine

## 2022-05-19 VITALS — BP 110/62 | HR 75 | Wt 142.6 lb

## 2022-05-19 DIAGNOSIS — A0472 Enterocolitis due to Clostridium difficile, not specified as recurrent: Secondary | ICD-10-CM | POA: Diagnosis not present

## 2022-05-19 DIAGNOSIS — I10 Essential (primary) hypertension: Secondary | ICD-10-CM

## 2022-05-19 DIAGNOSIS — L97822 Non-pressure chronic ulcer of other part of left lower leg with fat layer exposed: Secondary | ICD-10-CM | POA: Diagnosis not present

## 2022-05-19 NOTE — Assessment & Plan Note (Signed)
Well-controlled today off of losartan. - stop losartan for now - advised to monitor BP at home several times/week, he will call us if persistent elevated readings >140/90

## 2022-05-19 NOTE — Progress Notes (Signed)
THOS, MATSUMOTO ([id number]) Visit Report for 05/19/2022 SuperBill Details Patient Name: Date of Service: [name], [name] 05/19/2022 Medical Record Number: [id number] Patient Account Number: [id number] Date of Birth/Sex: Treating RN: 07-24-1946 (76 y.o. 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 Electronic Signature(s) Signed: 05/19/2022 12:42:48 PM By: Fredirick Maudlin MD FACS Signed: 05/19/2022 3:43:19 PM By: Deon Pilling RN, BSN Entered By: Deon Pilling on 05/19/2022 08:36:53

## 2022-05-19 NOTE — Progress Notes (Signed)
Brian Leon ([id number]) Visit Report for 05/19/2022 Arrival Information Details Patient Name: Date of Service: Brian Leon, [name] 05/19/2022 8:00 A M Medical Record Number: [id number] Patient Account Number: [id number] Date of Birth/Sex: Treating RN: Jan 11, 1946 (76 y.o. Brian Leon, Brian Leon Primary Care Brian Leon: Brian Leon Other Clinician: Referring Brian Leon: Treating Brian Leon/Extender: Brian Leon: 55 Visit Information History Since Last Visit Added or deleted any medications: No Patient Arrived: Ambulatory Any new allergies or adverse reactions: No Arrival Time: 08:19 Had a fall or experienced change in No Accompanied By: self activities of daily living that may affect Transfer Assistance: None risk of falls: Patient Identification Verified: Yes Signs or symptoms of abuse/neglect since last visito No Secondary Verification Process Completed: Yes Hospitalized since last visit: No Patient Requires Transmission-Based Precautions: No Implantable device outside of the clinic excluding No Patient Has Alerts: Yes cellular tissue based products placed in the center Patient Alerts: 11/02/21 L ABI: 1.37 since last visit: 11/02/2021 L TBI 0.77 Has Dressing in Place as Prescribed: Yes 11/02/2021 R TBI 0.69 Has Compression in Place as Prescribed: Yes 11/02/2021 R ABI 1.3 Pain Present Now: No Electronic Signature(s) Signed: 05/19/2022 3:43:19 PM By: Brian Leon Entered By: Brian Leon on 05/19/2022 08:36:04 -------------------------------------------------------------------------------- Compression Therapy Details Patient Name: Date of Service: Brian Leon, Brian NN P. 05/19/2022 8:00 A M Medical Record Number: [id number] Patient Account Number: [id number] Date of Birth/Sex: Treating RN: 1946/10/02 (76 y.o. [name] Primary Care Cambell Stanek: Brian Leon Other Clinician: Referring Cabella Kimm: Treating Brian Leon/Extender: Brian Leon: 27 Compression Therapy Performed for Wound Assessment: Wound #2 Left,Anterior Lower Leg Performed By: Clinician Brian Pilling, RN Compression Type: Three Hydrologist) Signed: 05/19/2022 3:43:19 PM By: Brian Leon Entered By: Brian Leon on 05/19/2022 08:36:27 -------------------------------------------------------------------------------- Compression Therapy Details Patient Name: Date of Service: Brian Leon, Brian NN P. 05/19/2022 8:00 A M Medical Record Number: [id number] Patient Account Number: [id number] Date of Birth/Sex: Treating RN: 22-Mar-1946 (76 y.o. [name] Primary Care Ameenah Prosser: Brian Leon Other Clinician: Referring Davanee Klinkner: Treating Ricketta Colantonio/Extender: Brian Leon: 27 Compression Therapy Performed for Wound Assessment: Wound #4 Right,Anterior Lower Leg Performed By: Clinician Brian Pilling, RN Compression Type: Three Hydrologist) Signed: 05/19/2022 3:43:19 PM By: Brian Leon Entered By: Brian Leon on 05/19/2022 08:36:27 -------------------------------------------------------------------------------- Encounter Discharge Information Details Patient Name: Date of Service: Brian Leon, Brian NN P. 05/19/2022 8:00 A M Medical Record Number: [id number] Patient Account Number: [id number] Date of Birth/Sex: Treating RN: Mar 21, 1946 (76 y.o. [name] Primary Care Caliegh Middlekauff: Brian Leon Other Clinician: Referring Christle Nolting: Treating Asta Corbridge/Extender: Brian Leon: 10 Encounter Discharge Information Items Discharge Condition: Stable Ambulatory Status: Ambulatory Discharge Destination: Home Transportation: Private Auto Accompanied By: self Schedule Follow-up Appointment: Yes Clinical Summary of Care: Electronic Signature(s) Signed: 05/19/2022 3:43:19 PM By: Brian Leon Entered By:  Brian Leon on 05/19/2022 08:36:47 -------------------------------------------------------------------------------- Wound Assessment Details Patient Name: Date of Service: Brian Leon, Brian NN P. 05/19/2022 8:00 A M Medical Record Number: [id number] Patient Account Number: [id number] Date of Birth/Sex: Treating RN: November 17, 1945 (76 y.o. [name] Primary Care Emmet Messer: Brian Leon Other Clinician: Referring Ahnya Akre: Treating Neilan Rizzo/Extender: Brian Leon: 27 Wound Status Wound Number: 2 Primary Etiology: Abrasion Wound Location: Left, Anterior Lower Leg Wound Status: Open Wounding  Event: Trauma Date Acquired: 01/04/2022 Weeks Of Leon: 15 Clustered Wound: No Wound Measurements Length: (cm) 0.5 Width: (cm) 0.6 Depth: (cm) 0.1 Area: (cm) 0.236 Volume: (cm) 0.024 % Reduction in Area: 44.3% % Reduction in Volume: 42.9% Wound Description Classification: Full Thickness Without Exposed Support Structu Exudate Amount: Medium Exudate Type: Serosanguineous Exudate Color: red, brown res Leon Notes Wound #2 (Lower Leg) Wound Laterality: Left, Anterior Cleanser Soap and Water Discharge Instruction: May shower and wash wound with dial antibacterial soap and water prior to dressing change. Peri-Wound Care Sween Lotion (Moisturizing lotion) Discharge Instruction: Apply moisturizing lotion as directed Topical Primary Dressing FIBRACOL Plus Dressing, 2x2 in (collagen) Discharge Instruction: Moisten collagen with saline or hydrogel Secondary Dressing ABD Pad, 8x10 Discharge Instruction: Apply over primary dressing as directed. Secured With Compression Wrap ThreePress (3 layer compression wrap) Discharge Instruction: Apply three layer compression as directed. Compression Stockings Add-Ons Electronic Signature(s) Signed: 05/19/2022 3:43:19 PM By: Brian Leon Entered By: Brian Leon on 05/19/2022  08:36:17 -------------------------------------------------------------------------------- Wound Assessment Details Patient Name: Date of Service: Brian Leon, Brian NN P. 05/19/2022 8:00 A M Medical Record Number: [id number] Patient Account Number: [id number] Date of Birth/Sex: Treating RN: Aug 30, 1946 (76 y.o. [name] Primary Care Rector [place]ons[place]: Brian Leon Other Clinician: Referring Dean Wonder: Treating Neomia Herbel/Extender: Brian Leon: 27 Wound Status Wound Number: 4 Primary Etiology: Abrasion Wound Location: Right, Anterior Lower Leg Wound Status: Open Wounding Event: Trauma Date Acquired: 01/04/2022 Weeks Of Leon: 15 Clustered Wound: No Wound Measurements Length: (cm) 1 Width: (cm) 1.2 Depth: (cm) 0.1 Area: (cm) 0.942 Volume: (cm) 0.094 Wound Description Classification: Full Thickness Without Exposed Support Structu Exudate Amount: Medium Exudate Type: Serosanguineous Exudate Color: red, brown res % Reduction in Area: -185.5% % Reduction in Volume: -184.8% Leon Notes Wound #4 (Lower Leg) Wound Laterality: Right, Anterior Cleanser Soap and Water Discharge Instruction: May shower and wash wound with dial antibacterial soap and water prior to dressing change. Peri-Wound Care Sween Lotion (Moisturizing lotion) Discharge Instruction: Apply moisturizing lotion as directed Topical Primary Dressing FIBRACOL Plus Dressing, 2x2 in (collagen) Discharge Instruction: Moisten collagen with saline or hydrogel Secondary Dressing ABD Pad, 8x10 Discharge Instruction: Apply over primary dressing as directed. Secured With Compression Wrap ThreePress (3 layer compression wrap) Discharge Instruction: Apply three layer compression as directed. Compression Stockings Add-Ons Electronic Signature(s) Signed: 05/19/2022 3:43:19 PM By: Brian Leon Entered By: Brian Leon on 05/19/2022 08:36:17

## 2022-05-19 NOTE — Progress Notes (Signed)
    SUBJECTIVE:   CHIEF COMPLAINT / HPI:  No chief complaint on file.   Tested positive for C. Diff, treated with PO vancomycin x 10d. Seen for follow-up 8/4. Started course of fidaxomicin x 10d on 8/10. Advised to hold losartan 50 mg.  Today patient reports feeling significantly better. No longer having watery stools. Semi-formed stools 3-4 times per day. Baseline is 3 BM per day. Mild abdominal discomfort. Denies fever, chills. Asking about every other day dosing of fidaxomicin (his daughter-in-law is an ID doctor and recommended that). He is 5.5 days into current antibiotic course.  PERTINENT  PMH / PSH: PMR, renal cell carcinoma s/p R partial nephrectomy 2016, HTN  Patient Care Team: Arlyce Dice, MD as PCP - General (Family Medicine) Mariel Aloe, MD as Referring Physician (Family Medicine)   OBJECTIVE:   BP 110/62   Pulse 75   Wt 142 lb 9.6 oz (64.7 kg)   SpO2 98%   BMI 20.76 kg/m   Physical Exam Constitutional:      General: He is not in acute distress. HENT:     Head: Normocephalic and atraumatic.  Cardiovascular:     Rate and Rhythm: Normal rate and regular rhythm.  Pulmonary:     Effort: Pulmonary effort is normal. No respiratory distress.     Breath sounds: Normal breath sounds.  Abdominal:     General: Bowel sounds are normal.     Palpations: Abdomen is soft.     Tenderness: There is no abdominal tenderness.  Musculoskeletal:     Cervical back: Neck supple.  Neurological:     Mental Status: He is alert.         05/09/2022    2:52 PM  Depression screen PHQ 2/9  Decreased Interest 0  Down, Depressed, Hopeless 0  PHQ - 2 Score 0  Altered sleeping 0  Tired, decreased energy 0  Change in appetite 0  Feeling bad or failure about yourself  0  Trouble concentrating 0  Moving slowly or fidgety/restless 0  Suicidal thoughts 0  PHQ-9 Score 0     {Show previous vital signs (optional):23777}    ASSESSMENT/PLAN:   C. Difficile colitis  recurrence First C. Diff recurrence. Improving with fidaxomicin treatment. Every other day dosing of fidaxomicin is an acceptable alternative after day 5. - check BMP - space out fidaxomicin to every other day until completion  Essential (primary) hypertension Well-controlled today off of losartan. - stop losartan for now - advised to monitor BP at home several times/week, he will call us if persistent elevated readings >140/90    Return in about 3 months (around 08/19/2022) for f/u HTN.   Zola Button, MD [place]

## 2022-05-19 NOTE — Patient Instructions (Addendum)
It was nice seeing you today!  Check your blood pressure at home at least several times a week and write them down. Give [place] a call if you are getting consistent readings greater than 140/90.  You can space out the fidaxomicin to every other day.  Stay well, Zola Button, MD [place] [phone]  --  Make sure to check out at the front desk before you leave today.  Please arrive at least 15 minutes prior to your scheduled appointments.  If you had blood work today, I will send you a MyChart message or a letter if results are normal. Otherwise, I will give you a call.  If you had a referral placed, they will call you to set up an appointment. Please give [place] a call if you don't hear back in the next 2 weeks.  If you need additional refills before your next appointment, please call your pharmacy first.

## 2022-05-20 ENCOUNTER — Encounter: Payer: Self-pay | Admitting: Family Medicine

## 2022-05-20 LAB — BASIC METABOLIC PANEL
BUN/Creatinine Ratio: 11 (ref 10–24)
BUN: 11 mg/dL (ref 8–27)
CO2: 20 mmol/L (ref 20–29)
Calcium: 8.9 mg/dL (ref 8.6–10.2)
Chloride: 107 mmol/L — ABNORMAL HIGH (ref 96–106)
Creatinine, Ser: 0.98 mg/dL (ref 0.76–1.27)
Glucose: 73 mg/dL (ref 70–99)
Potassium: 3.9 mmol/L (ref 3.5–5.2)
Sodium: 139 mmol/L (ref 134–144)
eGFR: 79 mL/min/{1.73_m2} (ref >59)

## 2022-05-24 ENCOUNTER — Other Ambulatory Visit: Payer: Self-pay | Admitting: *Deleted

## 2022-05-24 MED ORDER — PREDNISONE 1 MG PO TABS: 3.0000 mg | ORAL_TABLET | Freq: Every day | ORAL | 0 refills | Status: DC

## 2022-05-24 NOTE — Telephone Encounter (Signed)
Patient contacted the office requesting refill on Prednisone 3 mg daily to be sent to the [facility].   Next Visit: 11/01/2022  Last Visit: 04/28/2022  Last Fill: 02/18/2022  Dx: Polymyalgia rheumatica   Current Dose per office note on 04/28/2022: prednisone 3 mg daily and is unable to taper.   Okay to refill Prednisone?

## 2022-05-26 ENCOUNTER — Encounter (HOSPITAL_BASED_OUTPATIENT_CLINIC_OR_DEPARTMENT_OTHER): Payer: Medicare (Managed Care) | Admitting: Internal Medicine

## 2022-05-26 DIAGNOSIS — L97822 Non-pressure chronic ulcer of other part of left lower leg with fat layer exposed: Secondary | ICD-10-CM | POA: Diagnosis not present

## 2022-05-26 DIAGNOSIS — L97912 Non-pressure chronic ulcer of unspecified part of right lower leg with fat layer exposed: Secondary | ICD-10-CM

## 2022-05-26 NOTE — Progress Notes (Signed)
Brian Leon ([id number]) Visit Report for 05/26/2022 Arrival Information Details Patient Name: Date of Service: Brian Leon, [name] 05/26/2022 8:15 A M Medical Record Number: [id number] Patient Account Number: [id number] Date of Birth/Sex: Treating RN: 1945-12-21 (76 y.o. Brian Leon, [name] Primary Care Angeliah Wisdom: [name] Other Clinician: Referring Jeffrie Lofstrom: Treating Morning Halberg/Extender: [name] Weeks in Treatment: 28 Visit Information History Since Last Visit Added or deleted any medications: Yes Patient Arrived: Ambulatory Any new allergies or adverse reactions: No Arrival Time: 08:17 Had a fall or experienced change in No Accompanied By: self activities of daily living that may affect Transfer Assistance: None risk of falls: Patient Identification Verified: Yes Signs or symptoms of abuse/neglect since last visito No Secondary Verification Process Completed: Yes Hospitalized since last visit: No Patient Requires Transmission-Based Precautions: No Implantable device outside of the clinic excluding No Patient Has Alerts: Yes cellular tissue based products placed in the center Patient Alerts: 11/02/21 L ABI: 1.37 since last visit: 11/02/2021 L TBI 0.77 Has Dressing in Place as Prescribed: Yes 11/02/2021 R TBI 0.69 Has Compression in Place as Prescribed: Yes 11/02/2021 R ABI 1.3 Pain Present Now: No Electronic Signature(s) Signed: 05/26/2022 3:54:54 PM By: Deon Pilling RN, BSN Entered By: Deon Pilling on 05/26/2022 08:30:53 -------------------------------------------------------------------------------- Compression Therapy Details Patient Name: Date of Service: Brian Leon, RO NN P. 05/26/2022 8:15 A M Medical Record Number: [id number] Patient Account Number: [id number] Date of Birth/Sex: Treating RN: September 07, 1946 (76 y.o. Brian Leon Primary Care Diasha Castleman: [name] Other Clinician: Referring Samariah Hokenson: Treating Dru Primeau/Extender: [name] Weeks in Treatment: 28 Compression Therapy Performed for Wound Assessment: Wound #2 Left,Anterior Lower Leg Performed By: Clinician Deon Pilling, RN Compression Type: Three Layer Post Procedure Diagnosis Same as Pre-procedure Electronic Signature(s) Signed: 05/26/2022 3:54:54 PM By: Deon Pilling RN, BSN Entered By: Deon Pilling on 05/26/2022 08:44:25 -------------------------------------------------------------------------------- Compression Therapy Details Patient Name: Date of Service: Brian Leon, RO NN P. 05/26/2022 8:15 A M Medical Record Number: [id number] Patient Account Number: [id number] Date of Birth/Sex: Treating RN: 1946/07/10 (76 y.o. Brian Leon Primary Care Paulyne Mooty: [name] Other Clinician: Referring Makeisha Jentsch: Treating Michelene Keniston/Extender: [name] Weeks in Treatment: 28 Compression Therapy Performed for Wound Assessment: Wound #4 Right,Anterior Lower Leg Performed By: Clinician Deon Pilling, RN Compression Type: Three Layer Post Procedure Diagnosis Same as Pre-procedure Electronic Signature(s) Signed: 05/26/2022 3:54:54 PM By: Deon Pilling RN, BSN Entered By: Deon Pilling on 05/26/2022 08:44:25 -------------------------------------------------------------------------------- Encounter Discharge Information Details Patient Name: Date of Service: Brian Leon, RO NN P. 05/26/2022 8:15 A M Medical Record Number: [id number] Patient Account Number: [id number] Date of Birth/Sex: Treating RN: [birth year] (76 y.o. Brian Leon Primary Care Brian Leon: [name] Other Clinician: Referring Brian Leon: Treating Brian Leon/Extender: [name] Weeks in Treatment: 54 Encounter Discharge Information Items Post Procedure Vitals Discharge Condition: Stable Temperature (F): 98.6 Ambulatory Status: Ambulatory Pulse (bpm): 57 Discharge Destination: Home Respiratory Rate (breaths/min):  16 Transportation: Private Auto Blood Pressure (mmHg): 154/79 Accompanied By: self Schedule Follow-up Appointment: Yes Clinical Summary of Care: Electronic Signature(s) Signed: 05/26/2022 3:54:54 PM By: Deon Pilling RN, BSN Entered By: Deon Pilling on 05/26/2022 08:47:44 -------------------------------------------------------------------------------- Lower Extremity Assessment Details Patient Name: Date of Service: Brian Leon, RO NN P. 05/26/2022 8:15 A M Medical Record Number: [id number] Patient Account Number: [id number] Date of Birth/Sex: Treating RN: 1945-10-29 (76 y.o. Brian Leon Primary Care Brian Leon: [name] Other Clinician: Referring  Brian Leon: Treating Brian Leon/Extender: Brian Leon, JA CKY Weeks in Treatment: 28 Edema Assessment Assessed: [Left: Yes] [Right: Yes] Edema: [Left: No] [Right: No] Calf Left: Right: Point of Measurement: 37 cm From Medial Instep 28 cm 28 cm Ankle Left: Right: Point of Measurement: 12 cm From Medial Instep 18 cm 18 cm Vascular Assessment Pulses: Dorsalis Pedis Palpable: [Left:Yes] [Right:Yes] Electronic Signature(s) Signed: 05/26/2022 3:54:54 PM By: Deon Pilling RN, BSN Entered By: Deon Pilling on 05/26/2022 08:31:37 -------------------------------------------------------------------------------- Multi Wound Chart Details Patient Name: Date of Service: Brian Leon, RO NN P. 05/26/2022 8:15 A M Medical Record Number: [id number] Patient Account Number: [id number] Date of Birth/Sex: Treating RN: 26-Jun-1946 (76 y.o. Brian Leon Primary Care Brian Leon: [name] Other Clinician: Referring Tyrek Lawhorn: Treating Paublo Warshawsky/Extender: [name] Weeks in Treatment: 28 Vital Signs Height(in): 68 Pulse(bpm): 1 Weight(lbs): 140 Blood Pressure(mmHg): 154/79 Body Mass Index(BMI): 21.3 Temperature(F): 98.7 Respiratory Rate(breaths/min): 16 Photos: [N/A:N/A] Left, Anterior Lower Leg Right,  Anterior Lower Leg N/A Wound Location: Trauma Trauma N/A Wounding Event: Abrasion Abrasion N/A Primary Etiology: Asthma, Hypertension, Hepatitis A, Asthma, Hypertension, Hepatitis A, N/A Comorbid History: Rheumatoid Arthritis Rheumatoid Arthritis 01/04/2022 01/04/2022 N/A Date Acquired: 16 16 N/A Weeks of Treatment: Open Open N/A Wound Status: No No N/A Wound Recurrence: 0.1x0.1x0.1 0.4x0.4x0.1 N/A Measurements L x W x D (cm) 0.008 0.126 N/A A (cm) : rea 0.001 0.013 N/A Volume (cm) : 98.10% 61.80% N/A % Reduction in Area: 97.60% 60.60% N/A % Reduction in Volume: Full Thickness Without Exposed Full Thickness Without Exposed N/A Classification: Support Structures Support Structures None Present Medium N/A Exudate Amount: N/A Serosanguineous N/A Exudate Type: N/A red, brown N/A Exudate Color: Distinct, outline attached Distinct, outline attached N/A Wound Margin: None Present (0%) Small (1-33%) N/A Granulation Amount: N/A Red, Pink N/A Granulation Quality: None Present (0%) Large (67-100%) N/A Necrotic Amount: Fascia: No Fat Layer (Subcutaneous Tissue): Yes N/A Exposed Structures: Fat Layer (Subcutaneous Tissue): No Fascia: No Tendon: No Tendon: No Muscle: No Muscle: No Joint: No Joint: No Bone: No Bone: No Large (67-100%) Large (67-100%) N/A Epithelialization: N/A Debridement - Selective/Open Wound N/A Debridement: Pre-procedure Verification/Time Out N/A 08:40 N/A Taken: N/A Lidocaine 5% topical ointment N/A Pain Control: N/A Slough N/A Tissue Debrided: N/A Non-Viable Tissue N/A Level: N/A 0.16 N/A Debridement A (sq cm): rea N/A Curette N/A Instrument: N/A None N/A Bleeding: N/A Pressure N/A Hemostasis A chieved: N/A 0 N/A Procedural Pain: N/A 0 N/A Post Procedural Pain: N/A Procedure was tolerated well N/A Debridement Treatment Response: N/A 0.4x0.4x0.1 N/A Post Debridement Measurements L x W x D (cm) N/A 0.013 N/A Post Debridement  Volume: (cm) Compression Therapy Compression Therapy N/A Procedures Performed: Debridement Treatment Notes Wound #2 (Lower Leg) Wound Laterality: Left, Anterior Cleanser Soap and Water Discharge Instruction: May shower and wash wound with dial antibacterial soap and water prior to dressing change. Peri-Wound Care Sween Lotion (Moisturizing lotion) Discharge Instruction: Apply moisturizing lotion as directed Topical Primary Dressing Xeroform Occlusive Gauze Dressing, 4x4 in Discharge Instruction: Apply to wound bed as instructed Secondary Dressing Woven Gauze Sponge, Non-Sterile 4x4 in Discharge Instruction: Apply over primary dressing as directed. Secured With Compression Wrap ThreePress (3 layer compression wrap) Discharge Instruction: Apply three layer compression as directed. Compression Stockings Add-Ons Wound #4 (Lower Leg) Wound Laterality: Right, Anterior Cleanser Soap and Water Discharge Instruction: May shower and wash wound with dial antibacterial soap and water prior to dressing change. Peri-Wound Care Sween Lotion (Moisturizing lotion) Discharge Instruction: Apply moisturizing lotion as directed Topical  Primary Dressing FIBRACOL Plus Dressing, 2x2 in (collagen) Discharge Instruction: Moisten collagen with saline or hydrogel Secondary Dressing Woven Gauze Sponge, Non-Sterile 4x4 in Discharge Instruction: Apply over primary dressing as directed. Secured With Compression Wrap ThreePress (3 layer compression wrap) Discharge Instruction: Apply three layer compression as directed. Compression Stockings Add-Ons Electronic Signature(s) Signed: 05/26/2022 10:45:22 AM By: Kalman Shan DO Signed: 05/26/2022 3:54:54 PM By: Deon Pilling RN, BSN Entered By: Kalman Shan on 05/26/2022 09:29:29 -------------------------------------------------------------------------------- Multi-Disciplinary Care Plan Details Patient Name: Date of Service: Brian Leon, RO NN P.  05/26/2022 8:15 A M Medical Record Number: [id number] Patient Account Number: [id number] Date of Birth/Sex: Treating RN: 1946-05-25 (76 y.o. Brian Leon Primary Care Modupe Shampine: [name] Other Clinician: Referring Anahlia Iseminger: Treating Hanley Woerner/Extender: [name] Weeks in Treatment: 28 Active Inactive Necrotic Tissue Nursing Diagnoses: Impaired tissue integrity related to necrotic/devitalized tissue Knowledge deficit related to management of necrotic/devitalized tissue Goals: Necrotic/devitalized tissue will be minimized in the wound bed Date Initiated: 02/03/2022 Target Resolution Date: 06/03/2022 Goal Status: Active Patient/caregiver will verbalize understanding of reason and process for debridement of necrotic tissue Date Initiated: 02/03/2022 Date Inactivated: 03/24/2022 Target Resolution Date: 04/01/2022 Goal Status: Met Interventions: Assess patient pain level pre-, during and post procedure and prior to discharge Provide education on necrotic tissue and debridement process Treatment Activities: Apply topical anesthetic as ordered : 02/03/2022 Excisional debridement : 02/03/2022 Notes: Electronic Signature(s) Signed: 05/26/2022 3:54:54 PM By: Deon Pilling RN, BSN Entered By: Deon Pilling on 05/26/2022 08:33:39 -------------------------------------------------------------------------------- Pain Assessment Details Patient Name: Date of Service: Brian Leon, RO NN P. 05/26/2022 8:15 A M Medical Record Number: [id number] Patient Account Number: [id number] Date of Birth/Sex: Treating RN: [birth year] (76 y.o. Brian Leon Primary Care Gracia Saggese: [name] Other Clinician: Referring Verdon Ferrante: Treating Jiovanna Frei/Extender: [name] Weeks in Treatment: 28 Active Problems Location of Pain Severity and Description of Pain Patient Has Paino No Site Locations Pain Management and Medication Current Pain Management: Electronic  Signature(s) Signed: 05/26/2022 3:54:54 PM By: Deon Pilling RN, BSN Entered By: Deon Pilling on 05/26/2022 08:31:25 -------------------------------------------------------------------------------- Patient/Caregiver Education Details Patient Name: Date of Service: Brian Leon, RO NN P. 8/24/2023andnbsp8:15 [place] Record Number: [id number] Patient Account Number: [id number] Date of Birth/Gender: Treating RN: [birth year] (76 y.o. Brian Leon Primary Care Physician: [name] Other Clinician: Referring Physician: Treating Physician/Extender: [name] Weeks in Treatment: 72 Education Assessment Education Provided To: Patient Education Topics Provided Wound/Skin Impairment: Handouts: Skin Care Do's and Dont's Methods: Explain/Verbal Responses: Reinforcements needed Electronic Signature(s) Signed: 05/26/2022 3:54:54 PM By: Deon Pilling RN, BSN Entered By: Deon Pilling on 05/26/2022 08:33:52 -------------------------------------------------------------------------------- Wound Assessment Details Patient Name: Date of Service: Brian Leon, RO NN P. 05/26/2022 8:15 A M Medical Record Number: [id number] Patient Account Number: [id number] Date of Birth/Sex: Treating RN: Feb 11, 1946 (76 y.o. Brian Leon Primary Care Delma Villalva: [name] Other Clinician: Referring Don Giarrusso: Treating Shelda Truby/Extender: [name] Weeks in Treatment: 28 Wound Status Wound Number: 2 Primary Etiology: Abrasion Wound Location: Left, Anterior Lower Leg Wound Status: Open Wounding Event: Trauma Comorbid History: Asthma, Hypertension, Hepatitis A, Rheumatoid Arthritis Date Acquired: 01/04/2022 Weeks Of Treatment: 16 Clustered Wound: No Photos Wound Measurements Length: (cm) 0.1 Width: (cm) 0.1 Depth: (cm) 0.1 Area: (cm) 0.008 Volume: (cm) 0.001 % Reduction in Area: 98.1% % Reduction in Volume: 97.6% Epithelialization: Large  (67-100%) Tunneling: No Undermining: No Wound Description Classification: Full Thickness Without Exposed Support  Structures Wound Margin: Distinct, outline attached Exudate Amount: None Present Foul Odor After Cleansing: No Slough/Fibrino No Wound Bed Granulation Amount: None Present (0%) Exposed Structure Necrotic Amount: None Present (0%) Fascia Exposed: No Fat Layer (Subcutaneous Tissue) Exposed: No Tendon Exposed: No Muscle Exposed: No Joint Exposed: No Bone Exposed: No Treatment Notes Wound #2 (Lower Leg) Wound Laterality: Left, Anterior Cleanser Soap and Water Discharge Instruction: May shower and wash wound with dial antibacterial soap and water prior to dressing change. Peri-Wound Care Sween Lotion (Moisturizing lotion) Discharge Instruction: Apply moisturizing lotion as directed Topical Primary Dressing Xeroform Occlusive Gauze Dressing, 4x4 in Discharge Instruction: Apply to wound bed as instructed Secondary Dressing Woven Gauze Sponge, Non-Sterile 4x4 in Discharge Instruction: Apply over primary dressing as directed. Secured With Compression Wrap ThreePress (3 layer compression wrap) Discharge Instruction: Apply three layer compression as directed. Compression Stockings Add-Ons Electronic Signature(s) Signed: 05/26/2022 3:54:54 PM By: Deon Pilling RN, BSN Entered By: Deon Pilling on 05/26/2022 08:28:58 -------------------------------------------------------------------------------- Wound Assessment Details Patient Name: Date of Service: Brian Leon, RO NN P. 05/26/2022 8:15 A M Medical Record Number: [id number] Patient Account Number: [id number] Date of Birth/Sex: Treating RN: 1946-01-29 (76 y.o. Brian Leon Primary Care Kelda Azad: [name] Other Clinician: Referring Avira Tillison: Treating Ponce Skillman/Extender: [name] Weeks in Treatment: 28 Wound Status Wound Number: 4 Primary Etiology: Abrasion Wound Location: Right,  Anterior Lower Leg Wound Status: Open Wounding Event: Trauma Comorbid History: Asthma, Hypertension, Hepatitis A, Rheumatoid Arthritis Date Acquired: 01/04/2022 Weeks Of Treatment: 16 Clustered Wound: No Photos Wound Measurements Length: (cm) 0.4 Width: (cm) 0.4 Depth: (cm) 0.1 Area: (cm) 0.126 Volume: (cm) 0.013 Wound Description Classification: Full Thickness Without Exposed Support Structures Wound Margin: Distinct, outline attached Exudate Amount: Medium Exudate Type: Serosanguineous Exudate Color: red, brown Foul Odor After Cleansing: No Slough/Fibrino Ye % Reduction in Area: 61.8% % Reduction in Volume: 60.6% Epithelialization: Large (67-100%) Tunneling: No Undermining: No s Wound Bed Granulation Amount: Small (1-33%) Exposed Structure Granulation Quality: Red, Pink Fascia Exposed: No Necrotic Amount: Large (67-100%) Fat Layer (Subcutaneous Tissue) Exposed: Yes Necrotic Quality: Adherent Slough Tendon Exposed: No Muscle Exposed: No Joint Exposed: No Bone Exposed: No Treatment Notes Wound #4 (Lower Leg) Wound Laterality: Right, Anterior Cleanser Soap and Water Discharge Instruction: May shower and wash wound with dial antibacterial soap and water prior to dressing change. Peri-Wound Care Sween Lotion (Moisturizing lotion) Discharge Instruction: Apply moisturizing lotion as directed Topical Primary Dressing FIBRACOL Plus Dressing, 2x2 in (collagen) Discharge Instruction: Moisten collagen with saline or hydrogel Secondary Dressing Woven Gauze Sponge, Non-Sterile 4x4 in Discharge Instruction: Apply over primary dressing as directed. Secured With Compression Wrap ThreePress (3 layer compression wrap) Discharge Instruction: Apply three layer compression as directed. Compression Stockings Add-Ons Electronic Signature(s) Signed: 05/26/2022 3:54:54 PM By: Deon Pilling RN, BSN Entered By: Deon Pilling on 05/26/2022  08:29:47 -------------------------------------------------------------------------------- Vitals Details Patient Name: Date of Service: Brian Leon, RO NN P. 05/26/2022 8:15 A M Medical Record Number: [id number] Patient Account Number: [id number] Date of Birth/Sex: Treating RN: [birth year] (76 y.o. Brian Leon Primary Care Makyna Niehoff: [name] Other Clinician: Referring Vesta Wheeland: Treating Barclay Lennox/Extender: [name] Weeks in Treatment: 28 Vital Signs Time Taken: 08:17 Temperature (F): 98.7 Height (in): 68 Pulse (bpm): 57 Weight (lbs): 140 Respiratory Rate (breaths/min): 16 Body Mass Index (BMI): 21.3 Blood Pressure (mmHg): 154/79 Reference Range: 80 - 120 mg / dl Notes per patient PCP discontinued his BP medicationn. Electronic Signature(s) Signed: 05/26/2022 3:54:54 PM By:  Deon Pilling RN, BSN Entered By: Deon Pilling on 05/26/2022 08:31:21

## 2022-05-26 NOTE — Progress Notes (Signed)
Brian Leon ([id number]) Visit Report for 05/26/2022 Chief Complaint Document Details Patient Name: Date of Service: Brian Leon, [name] 05/26/2022 8:15 A M Medical Record Number: [id number] Patient Account Number: [id number] Date of Birth/Sex: Treating RN: [birth year] (76 y.o. Brian Leon Primary Care Provider: [name] Other Clinician: Referring Provider: Treating Provider/Extender: [name] Weeks in Treatment: 41 Information Obtained from: Patient Chief Complaint 11/11/2021; Left lower extremity wound due to trauma 02/03/2022; bilateral lower extremity wounds due to trauma Electronic Signature(s) Signed: 05/26/2022 10:45:22 AM By: Kalman Shan DO Entered By: Kalman Shan on 05/26/2022 09:29:35 -------------------------------------------------------------------------------- Debridement Details Patient Name: Date of Service: Brian Leon, RO NN P. 05/26/2022 8:15 A M Medical Record Number: [id number] Patient Account Number: [id number] Date of Birth/Sex: Treating RN: 1946-07-03 (76 y.o. Brian Leon, [name] Primary Care Provider: [name] Other Clinician: Referring Provider: Treating Provider/Extender: [name] Weeks in Treatment: 28 Debridement Performed for Assessment: Wound #4 Right,Anterior Lower Leg Performed By: Physician Kalman Shan, DO Debridement Type: Debridement Level of Consciousness (Pre-procedure): Awake and Alert Pre-procedure Verification/Time Out Yes - 08:40 Taken: Start Time: 08:41 Pain Control: Lidocaine 5% topical ointment T Area Debrided (L x W): otal 0.4 (cm) x 0.4 (cm) = 0.16 (cm) Tissue and other material debrided: Non-Viable, Slough, Slough Level: Non-Viable Tissue Debridement Description: Selective/Open Wound Instrument: Curette Bleeding: None Hemostasis Achieved: Pressure End Time: 08:45 Procedural Pain: 0 Post Procedural Pain: 0 Response to Treatment: Procedure was tolerated  well Level of Consciousness (Post- Awake and Alert procedure): Post Debridement Measurements of Total Wound Length: (cm) 0.4 Width: (cm) 0.4 Depth: (cm) 0.1 Volume: (cm) 0.013 Character of Wound/Ulcer Post Debridement: Improved Post Procedure Diagnosis Same as Pre-procedure Electronic Signature(s) Signed: 05/26/2022 10:45:22 AM By: Kalman Shan DO Signed: 05/26/2022 3:54:54 PM By: Deon Pilling RN, BSN Entered By: Deon Pilling on 05/26/2022 08:45:35 -------------------------------------------------------------------------------- HPI Details Patient Name: Date of Service: Brian Leon, RO NN P. 05/26/2022 8:15 A M Medical Record Number: [id number] Patient Account Number: [id number] Date of Birth/Sex: Treating RN: [birth year] (76 y.o. Brian Leon Primary Care Provider: [name] Other Clinician: Referring Provider: Treating Provider/Extender: [name] Weeks in Treatment: 28 History of Present Illness HPI Description: Admission 11/11/2021 [name] is a 76 year old male with a past medical history of polymyalgia rheumatica that presents to the clinic for a 55-monthhistory of nonhealing wound to the left lower extremity. He states that on Halloween he hit an object and created a wound that has not healed. He states he has seen dermatology and his primary care physician for this issue. On one occasion he had an UHaematologist He has also used Xeroform. It does not sound like he uses any kind of wound dressing consistently. He currently reports chronic pain to the wound site. He denies systemic signs of infection. He denies purulent drainage. 2/16; patient presents for follow-up. He has been using Hydrofera Blue and gentamicin ointment to the wound bed without issues. He reports improvement in wound healing. He currently denies signs of infection. 2/23; patient presents for follow-up. He continues to use Hydrofera Blue and gentamicin ointment to the wound bed  without issues. He denies signs of infection. He reports improvement in his chronic pain to the wound bed. 3/2; patient presents for follow-up. He has been using Hydrofera Blue and gentamicin to the wound bed without issues. [place] He currently  denies signs of infection. 3/24; patient presents for follow-up. He has been in [place] for the past 3 weeks for vacation. He enjoyed his trip. He reports no issues today. He has been using Hydrofera Blue and gentamicin ointment to the wound beds without issues. 4/7; patient presents for follow-up. He has been using Hydrofera Blue to the wound bed without issues. He reports that his wound is healed. 02/03/2022; patient was discharged 1 month ago with a closed wound to the left lower extremity. Unfortunately over the past month he has developed 3 new wounds he states was caused by hitting his legs against different objects. He has been keeping the areas covered with DuoDERM. He reports mild tenderness to the right lower extremity wound bed with increased redness. He denies purulent drainage. He states he has his previous wound care supplies of Hydrofera Blue and gentamicin ointment. 5/11; patient presents for follow-up. He had a PCR culture done at last clinic visit that showed high levels of Staph aureus and viridans and low levels of coagulase-negative staph. I started him on Augmentin at last clinic visit but he reports continued pain and erythema to the wound sites. He has been using gentamicin ointment and Hydrofera Blue with dressing changes. He also presents with a 100.2 temp cough and diarrhea. He has not taken anything for his symptoms. 5/18; patient presents for follow-up. He received his Keystone antibiotics and started using this. Unfortunately he mixed it incorrectly and it turned into a hardened substance. He had to use his refill to start all over again. He has only been using this for 1 day. He  states he is combining this correctly now. He completed his course of clindamycin. He denies signs of infection. 5/25; patient presents for follow-up. He has been using Keystone antibiotics with Sorbact. He reports more serous drainage from the left lower extremity. Other than that he has no issues or complaints. He denies signs of infection. 6/1; patient presents for follow-up. We switched to Iodoflex and Keystone antibiotic under compression therapy at last clinic visit. He tolerated this treatment well. He has no issues or complaints today. He denies signs of infection. Unfortunately he will be out of town for the next 3 weeks. 6/22; patient presents for follow-up. He was in [place] for the past 3 weeks. He has been using compression stockings daily along with [facility] antibiotic and Hydrofera Blue. Unfortunately he developed a skin tear to his right arm after hitting a door. 6/29; patient presents for follow-up. We have been using Keystone antibiotics with PolyMem silver to the lower extremities under 3 layer compression. Apparently he has a skin tear to the lateral Aspect of the right leg that he states was there previously. I had not seen it before. His skin tear on his right arm is well-healing with the use of bacitracin. He has no issues or complaints today. 7/6; patient presents for follow-up. We have been using Keystone antibiotics with PolyMem silver to the lower extremities under 3 layer compression. And antibiotic ointment to the right lateral leg wound. Patient reports improvement in wound healing. 7/13; patient presents for follow-up. We have been using Keystone antibiotics and PolyMem silver to all the wound beds. Patient denies signs of infection. He tolerated the compression wraps well. He is currently being treated for C. difficile with vancomycin. 7/20; patient presents for follow-up. We have been using Keystone antibiotic and PolyMem silver to all wound beds. He continues to do  well with compression wraps bilaterally. He has no issues  or complaints today. 7/27; patient presents for follow-up. We have been using Keystone antibiotic and PolyMem silver to the wound beds under 3 layer compression. The right medial wound has healed. He has 2 remaining wounds. He denies signs of infection. 8/3; patient presents for follow-up. We have been using collagen under 3 layer compression. He has no issues or complaints today. 8/10; patient presents for follow-up. We have been using collagen under 3 layer compression. He has no issues or complaints today. 8/24; patient presents for follow-up. We have been using collagen due to 3 layer compression to both lower extremities. Patient has no issues or complaints today. Electronic Signature(s) Signed: 05/26/2022 10:45:22 AM By: Kalman Shan DO Entered By: Kalman Shan on 05/26/2022 09:30:31 -------------------------------------------------------------------------------- Physical Exam Details Patient Name: Date of Service: Brian Leon, RO NN P. 05/26/2022 8:15 A M Medical Record Number: [id number] Patient Account Number: [id number] Date of Birth/Sex: Treating RN: 1946/09/20 (76 y.o. Brian Leon Primary Care Provider: [name] Other Clinician: Referring Provider: Treating Provider/Extender: [name] Weeks in Treatment: 28 Constitutional respirations regular, non-labored and within target range for patient.. Cardiovascular 2+ dorsalis pedis/posterior tibialis pulses. Psychiatric pleasant and cooperative. Notes Left lower extremity: T the anterior aspect there is a small open wound with fibrinous tissue and granulation tissue. Right lower extremity: T the anterior distal o o aspect there is an open wound with granulation tissue, fibrinous tissue and slough. No surrounding signs of infection to any of the wound beds. Good edema control. Electronic Signature(s) Signed: 05/26/2022 10:45:22 AM By:  Kalman Shan DO Entered By: Kalman Shan on 05/26/2022 09:31:04 -------------------------------------------------------------------------------- Physician Orders Details Patient Name: Date of Service: Brian Leon, RO NN P. 05/26/2022 8:15 A M Medical Record Number: [id number] Patient Account Number: [id number] Date of Birth/Sex: Treating RN: 05/31/1946 (76 y.o. 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 - Dr. [facility] [place] [facility] [place], Room 8 Tuesday 0815 05/31/2022 Return A Other: - Bring in your compression stockings next week. Anesthetic Wound #4 Right,Anterior Lower Leg (In clinic) Topical Lidocaine 5% applied to wound bed Bathing/ Shower/ Hygiene May shower with protection but do not get wound dressing(s) wet. Edema Control - Lymphedema / SCD / Other Elevate legs to the level of the heart or above for 30 minutes daily and/or when sitting, a frequency of: - 3-4 times a day throughout the day. Avoid standing for long periods of time. Exercise regularly Wound Treatment Wound #2 - Lower Leg Wound Laterality: Left, Anterior Cleanser: Soap and Water 1 x Per Week/30 Days Discharge Instructions: May shower and wash wound with dial antibacterial soap and water prior to dressing change. Peri-Wound Care: Sween Lotion (Moisturizing lotion) 1 x Per Week/30  Days Discharge Instructions: Apply moisturizing lotion as directed Prim Dressing: Xeroform Occlusive Gauze Dressing, 4x4 in 1 x Per Week/30 Days ary Discharge Instructions: Apply to wound bed as instructed Secondary Dressing: Woven Gauze Sponge, Non-Sterile 4x4 in 1 x Per Week/30 Days Discharge Instructions: Apply over primary dressing  as directed. Compression Wrap: ThreePress (3 layer compression wrap) 1 x Per Week/30 Days Discharge Instructions: Apply three layer compression as directed. Wound #4 - Lower Leg Wound Laterality: Right, Anterior Cleanser: Soap and Water 1 x Per Week/30 Days Discharge Instructions: May shower and wash wound with dial antibacterial soap and water prior to dressing change. Peri-Wound Care: Sween Lotion (Moisturizing lotion) 1 x Per Week/30 Days Discharge Instructions: Apply moisturizing lotion as directed Prim Dressing: FIBRACOL Plus Dressing, 2x2 in (collagen) 1 x Per Week/30 Days ary Discharge Instructions: Moisten collagen with saline or hydrogel Secondary Dressing: Woven Gauze Sponge, Non-Sterile 4x4 in 1 x Per Week/30 Days Discharge Instructions: Apply over primary dressing as directed. Compression Wrap: ThreePress (3 layer compression wrap) 1 x Per Week/30 Days Discharge Instructions: Apply three layer compression as directed. Electronic Signature(s) Signed: 05/26/2022 10:45:22 AM By: Kalman Shan DO Entered By: Kalman Shan on 05/26/2022 09:31:17 -------------------------------------------------------------------------------- Problem List Details Patient Name: Date of Service: Brian Leon, [place] NN P. 05/26/2022 8:15 A M Medical Record Number: [id number] Patient Account Number: [id number] Date of Birth/Sex: Treating RN: Jan 31, 1946 (76 y.o. Brian Leon, [name] Primary Care Provider: [name] Other Clinician: Referring Provider: Treating Provider/Extender: [name] Weeks in Treatment: 42 Active  Problems ICD-10 Encounter Code Description Active Date MDM Diagnosis L97.822 Non-pressure chronic ulcer of other part of left lower leg with fat layer exposed2/06/2022 No Yes L97.912 Non-pressure chronic ulcer of unspecified part of right lower leg with fat layer 02/03/2022 No Yes exposed S41.101A Unspecified open wound of right upper arm, initial encounter 03/24/2022 No Yes T14.90XA Injury, unspecified, initial encounter 11/11/2021 No Yes M35.3 Polymyalgia rheumatica 11/11/2021 No Yes I87.2 Venous insufficiency (chronic) (peripheral) 02/24/2022 No Yes Inactive Problems Resolved Problems Electronic Signature(s) Signed: 05/26/2022 10:45:22 AM By: Kalman Shan DO Entered By: Kalman Shan on 05/26/2022 09:29:24 -------------------------------------------------------------------------------- Progress Note Details Patient Name: Date of Service: Brian Leon, RO NN P. 05/26/2022 8:15 A M Medical Record Number: [id number] Patient Account Number: [id number] Date of Birth/Sex: Treating RN: 01-09-1946 (76 y.o. Brian Leon Primary Care Provider: [name] Other Clinician: Referring Provider: Treating Provider/Extender: [name] Weeks in Treatment: 12 Subjective Chief Complaint Information obtained from Patient 11/11/2021; Left lower extremity wound due to trauma 02/03/2022; bilateral lower extremity wounds due to trauma History of Present Illness (HPI) Admission 11/11/2021 Mr. Aubry Weakland is a 76 year old male with a past medical history of polymyalgia rheumatica that presents to the clinic for a 24-monthhistory of nonhealing wound to the left lower extremity. He states that on Halloween he hit an object and created a wound that has not healed. He states he has seen dermatology and his primary care physician for this issue. On one occasion he had an UHaematologist He has also used Xeroform. It does not sound like he uses any kind of wound dressing consistently. He currently  reports chronic pain to the wound site. He denies systemic signs of infection. He denies purulent drainage. 2/16; patient presents for follow-up. He has been using Hydrofera Blue and gentamicin ointment to the wound bed without issues. He reports improvement in wound healing. He currently denies signs of infection. 2/23; patient presents for follow-up. He continues to use Hydrofera Blue and gentamicin ointment to the wound bed without issues. He denies signs of infection. He reports improvement in his chronic pain to the wound bed. 3/2; patient presents for follow-up. He has been using Hydrofera Blue and gentamicin to the wound bed without  issues. He reports he is going to [place] for the next 3 weeks. He currently denies signs of infection. 3/24; patient presents for follow-up. He has been in [place] for the past 3 weeks for vacation. He enjoyed his trip. He reports no issues today. He has been using Hydrofera Blue and gentamicin ointment to the wound beds without issues. 4/7; patient presents for follow-up. He has been using Hydrofera Blue to the wound bed without issues. He reports that his wound is healed. 02/03/2022; patient was discharged 1 month ago with a closed wound to the left lower extremity. Unfortunately over the past month he has developed 3 new wounds he states was caused by hitting his legs against different objects. He has been keeping the areas covered with DuoDERM. He reports mild tenderness to the right lower extremity wound bed with increased redness. He denies purulent drainage. He states he has his previous wound care supplies of Hydrofera Blue and gentamicin ointment. 5/11; patient presents for follow-up. He had a PCR culture done at last clinic visit that showed high levels of Staph aureus and viridans and low levels of coagulase-negative staph. I started him on Augmentin at last clinic visit but he reports continued pain and erythema to the wound sites. He has been  using gentamicin ointment and Hydrofera Blue with dressing changes. He also presents with a 100.2 temp cough and diarrhea. He has not taken anything for his symptoms. 5/18; patient presents for follow-up. He received his Keystone antibiotics and started using this. Unfortunately he mixed it incorrectly and it turned into a hardened substance. He had to use his refill to start all over again. He has only been using this for 1 day. He states he is combining this correctly now. He completed his course of clindamycin. He denies signs of infection. 5/25; patient presents for follow-up. He has been using Keystone antibiotics with Sorbact. He reports more serous drainage from the left lower extremity. Other than that he has no issues or complaints. He denies signs of infection. 6/1; patient presents for follow-up. We switched to Iodoflex and Keystone antibiotic under compression therapy at last clinic visit. He tolerated this treatment well. He has no issues or complaints today. He denies signs of infection. Unfortunately he will be out of town for the next 3 weeks. 6/22; patient presents for follow-up. He was in [place] for the past 3 weeks. He has been using compression stockings daily along with [facility] antibiotic and Hydrofera Blue. Unfortunately he developed a skin tear to his right arm after hitting a door. 6/29; patient presents for follow-up. We have been using Keystone antibiotics with PolyMem silver to the lower extremities under 3 layer compression. Apparently he has a skin tear to the lateral Aspect of the right leg that he states was there previously. I had not seen it before. His skin tear on his right arm is well-healing with the use of bacitracin. He has no issues or complaints today. 7/6; patient presents for follow-up. We have been using Keystone antibiotics with PolyMem silver to the lower extremities under 3 layer compression. And antibiotic ointment to the right lateral leg wound.  Patient reports improvement in wound healing. 7/13; patient presents for follow-up. We have been using Keystone antibiotics and PolyMem silver to all the wound beds. Patient denies signs of infection. He tolerated the compression wraps well. He is currently being treated for C. difficile with vancomycin. 7/20; patient presents for follow-up. We have been using Keystone antibiotic and PolyMem silver to all  wound beds. He continues to do well with compression wraps bilaterally. He has no issues or complaints today. 7/27; patient presents for follow-up. We have been using Keystone antibiotic and PolyMem silver to the wound beds under 3 layer compression. The right medial wound has healed. He has 2 remaining wounds. He denies signs of infection. 8/3; patient presents for follow-up. We have been using collagen under 3 layer compression. He has no issues or complaints today. 8/10; patient presents for follow-up. We have been using collagen under 3 layer compression. He has no issues or complaints today. 8/24; patient presents for follow-up. We have been using collagen due to 3 layer compression to both lower extremities. Patient has no issues or complaints today. Patient History Information obtained from Chart. Family History Cancer - Mother, No family history of Diabetes, Heart Disease, Hypertension, Kidney Disease, Lung Disease, Seizures, Stroke, Thyroid Problems, Tuberculosis. Social History Former smoker - quit 40 years ago, Marital Status - Married, Alcohol Use - Never, Drug Use - Current History - marijuana, Caffeine Use - Rarely. Medical History Eyes Denies history of Cataracts, Glaucoma, Optic Neuritis Ear/Nose/Mouth/Throat Denies history of Chronic sinus problems/congestion, Middle ear problems Hematologic/Lymphatic Denies history of Anemia, Hemophilia, Human Immunodeficiency Virus, Lymphedema, Sickle Cell Disease Respiratory Patient has history of Asthma Denies history of Aspiration,  Chronic Obstructive Pulmonary Disease (COPD), Pneumothorax, Sleep Apnea, Tuberculosis Cardiovascular Patient has history of Hypertension Denies history of Angina, Arrhythmia, Congestive Heart Failure, Coronary Artery Disease, Deep Vein Thrombosis, Hypotension, Myocardial Infarction, Peripheral Arterial Disease, Peripheral Venous Disease, Phlebitis, Vasculitis Gastrointestinal Patient has history of Hepatitis A Denies history of Cirrhosis , Colitis, Crohnoos, Hepatitis B, Hepatitis C Endocrine Denies history of Type I Diabetes, Type II Diabetes Genitourinary Denies history of End Stage Renal Disease Immunological Denies history of Lupus Erythematosus, Raynaudoos, Scleroderma Integumentary (Skin) Denies history of History of Burn Musculoskeletal Patient has history of Rheumatoid Arthritis Denies history of Gout, Osteoarthritis, Osteomyelitis Psychiatric Denies history of Anorexia/bulimia, Confinement Anxiety Hospitalization/Surgery History - 5 years ago Renal Cell carcinoma. 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 Vitals Time Taken: 8:17 AM, Height: 68 in, Weight: 140 lbs, BMI: 21.3, Temperature: 98.7 F, Pulse: 57 bpm, Respiratory Rate: 16 breaths/min, Blood Pressure: 154/79 mmHg. General Notes: per patient PCP discontinued his BP medicationn. Cardiovascular 2+ dorsalis pedis/posterior tibialis pulses. Psychiatric pleasant and cooperative. General  Notes: Left lower extremity: T the anterior aspect there is a small open wound with fibrinous tissue and granulation tissue. Right lower extremity: T o o the anterior distal aspect there is an open wound with granulation tissue, fibrinous tissue and slough. No surrounding signs of infection to any of the wound beds. Good edema control. Integumentary (Hair, Skin) Wound #2 status is Open. Original cause of wound was Trauma. The date acquired was: 01/04/2022. The wound has been in treatment 16 weeks. The wound is located on the Left,Anterior Lower Leg. The wound measures 0.1cm length x 0.1cm width x 0.1cm depth; 0.008cm^2 area and 0.001cm^3 volume. There is no tunneling or undermining noted. There is a none present amount of drainage noted. The wound margin is distinct with the outline attached to the wound base. There is no  granulation within the wound bed. There is no necrotic tissue within the wound bed. Wound #4 status is Open. Original cause of wound was Trauma. The date acquired was: 01/04/2022. The wound has been in treatment 16 weeks. The wound is located on the Right,Anterior Lower Leg. The wound measures 0.4cm length x 0.4cm width x 0.1cm depth; 0.126cm^2 area and 0.013cm^3 volume. There is Fat Layer (Subcutaneous Tissue) exposed. There is no tunneling or undermining noted. There is a medium amount of serosanguineous drainage noted. The wound margin is distinct with the outline attached to the wound base. There is small (1-33%) red, pink granulation within the wound bed. There is a large (67-100%) amount of necrotic tissue within the wound bed including Adherent Slough. 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 The left leg is almost healed and I recommended Xeroform under compression therapy here. I debrided nonviable tissue to the right lower extremity. I recommended continuing collagen here with compression therapy. Patient has compression stockings at home and asked him to bring these in next clinic visit. Procedures Wound #4 Pre-procedure diagnosis of Wound #4 is an Abrasion located on the Right,Anterior Lower Leg . There was a Selective/Open Wound Non-Viable Tissue Debridement with a total area of 0.16 sq cm performed by Kalman Shan, DO. With the following instrument(s): Curette to remove Non-Viable tissue/material. Material removed includes [facility] after achieving pain control using Lidocaine 5% topical ointment. A time out was conducted at 08:40, prior to the start of the procedure. There was no bleeding. The procedure was tolerated well with a pain level of 0 throughout and a pain level of 0 following the procedure. Post Debridement Measurements: 0.4cm length x 0.4cm width x 0.1cm depth; 0.013cm^3 volume. Character of Wound/Ulcer Post Debridement is improved. Post procedure Diagnosis Wound #4: Same as Pre-Procedure Pre-procedure diagnosis of Wound #4 is an Abrasion located on the Right,Anterior Lower Leg . There was a Three Layer Compression Therapy Procedure by Deon Pilling, RN. Post procedure Diagnosis Wound #4: Same as Pre-Procedure Wound #2 Pre-procedure diagnosis of Wound #2 is an Abrasion located on the Left,Anterior Lower Leg . There was a Three Layer Compression Therapy Procedure by Deon Pilling, RN. Post procedure Diagnosis Wound #2: Same as Pre-Procedure Plan Follow-up Appointments: Return Appointment in 1 week. - Dr. Heber [place] and [place], Room 8 Tuesday 0815 05/31/2022 Other: - Bring in your compression stockings next  week. Anesthetic: Wound #4 Right,Anterior Lower Leg: (In clinic) Topical Lidocaine 5% applied to wound bed Bathing/ Shower/ Hygiene: May shower with protection but do not get wound dressing(s) wet. Edema Control - Lymphedema / SCD / Other: Elevate legs to the level of the heart or above for 30 minutes daily and/or when sitting, a frequency of: - 3-4 times a day throughout the day. Avoid standing for long periods of time. Exercise regularly WOUND #2: - Lower Leg Wound Laterality: Left, Anterior Cleanser: Soap and Water 1 x Per Week/30 Days Discharge Instructions: May shower and wash wound with dial antibacterial soap and water prior to dressing change. Peri-Wound Care: Sween Lotion (Moisturizing lotion) 1 x Per Week/30 Days Discharge Instructions: Apply moisturizing lotion as directed Prim Dressing: Xeroform Occlusive Gauze Dressing, 4x4  in 1 x Per Week/30 Days ary Discharge Instructions: Apply to wound bed as instructed Secondary Dressing: Woven Gauze Sponge, Non-Sterile 4x4 in 1 x Per Week/30 Days Discharge Instructions: Apply over primary dressing as directed. Com pression Wrap: ThreePress (3 layer compression wrap) 1 x Per Week/30 Days Discharge Instructions: Apply three layer compression as directed. WOUND #4: - Lower Leg Wound Laterality: Right, Anterior Cleanser: Soap and Water 1 x Per Week/30 Days Discharge Instructions: May shower and wash wound with dial antibacterial soap and water prior to dressing change. Peri-Wound Care: Sween Lotion (Moisturizing lotion) 1 x Per Week/30 Days Discharge Instructions: Apply moisturizing lotion as directed Prim Dressing: FIBRACOL Plus Dressing, 2x2 in (collagen) 1 x Per Week/30 Days ary Discharge Instructions: Moisten collagen with saline or hydrogel Secondary Dressing: Woven Gauze Sponge, Non-Sterile 4x4 in 1 x Per Week/30 Days Discharge Instructions: Apply over primary dressing as directed. Com pression Wrap: ThreePress (3 layer  compression wrap) 1 x Per Week/30 Days Discharge Instructions: Apply three layer compression as directed. 1. In office sharp debridement 2. Xeroform 3. Collagen 4. 3 layer compression 5. Follow-up in 1 week Electronic Signature(s) Signed: 05/26/2022 10:45:22 AM By: Kalman Shan DO Entered By: Kalman Shan on 05/26/2022 09:32:26 -------------------------------------------------------------------------------- HxROS Details Patient Name: Date of Service: Brian Leon, RO NN P. 05/26/2022 8:15 A M Medical Record Number: [id number] Patient Account Number: [id number] Date of Birth/Sex: Treating RN: May 10, 1946 (76 y.o. Brian Leon Primary Care Provider: [name] Other Clinician: Referring Provider: Treating Provider/Extender: [name] Weeks in Treatment: 28 Information Obtained From Chart Eyes Medical History: Negative for: Cataracts; Glaucoma; Optic Neuritis Past Medical History Notes: Blind right eye WEARS PROSTHESIS Ear/Nose/Mouth/Throat Medical History: Negative for: Chronic sinus problems/congestion; Middle ear problems Past Medical History Notes: Allergic rhinitis Hematologic/Lymphatic Medical History: Negative for: Anemia; Hemophilia; Human Immunodeficiency Virus; Lymphedema; Sickle Cell Disease Respiratory Medical History: Positive for: Asthma Negative for: Aspiration; Chronic Obstructive Pulmonary Disease (COPD); Pneumothorax; Sleep Apnea; Tuberculosis Past Medical History Notes: Pneumonia Cardiovascular Medical History: Positive for: Hypertension Negative for: Angina; Arrhythmia; Congestive Heart Failure; Coronary Artery Disease; Deep Vein Thrombosis; Hypotension; Myocardial Infarction; Peripheral Arterial Disease; Peripheral Venous Disease; Phlebitis; Vasculitis Past Medical History Notes: Thoracic aortic aneurysm Thrombocytosis Gastrointestinal Medical History: Positive for: Hepatitis A Negative for: Cirrhosis ; Colitis;  Crohns; Hepatitis B; Hepatitis C Endocrine Medical History: Negative for: Type I Diabetes; Type II Diabetes Genitourinary Medical History: Negative for: End Stage Renal Disease Past Medical History Notes: 1/3kidney missing per patient-Renal Cell carcinoma Renal Cyst 5 years ago Immunological Medical History: Negative for: Lupus Erythematosus; Raynauds; Scleroderma Integumentary (Skin) Medical History: Negative for: History of Burn Musculoskeletal Medical History: Positive for: Rheumatoid Arthritis Negative for: Gout; Osteoarthritis; Osteomyelitis Past Medical History Notes: 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 Eczema Neurologic Medical History: Past Medical History Notes: Paraureteric diverticulum Oncologic Medical History: Past Medical History Notes: Renal Cell carcinoma- 5 years ago Psychiatric Medical History: Negative for: Anorexia/bulimia; Confinement Anxiety Immunizations Pneumococcal Vaccine: Received Pneumococcal Vaccination: Yes Received Pneumococcal Vaccination On or After 60th Birthday: Yes Implantable Devices No devices added Hospitalization / Surgery History Type of Hospitalization/Surgery 5 years ago Renal Cell carcinoma Family and Social History Cancer: Yes - Mother; Diabetes: No; Heart Disease: No; Hypertension: No; Kidney Disease: No; Lung Disease: No; Seizures: No; Stroke: No; Thyroid Problems: No; Tuberculosis: No; Former smoker - quit 40 years ago; Marital Status - Married; Alcohol Use: Never; Drug Use: Current History - marijuana; Caffeine Use: Rarely; Financial Concerns: No;  Food, Games developer or Shelter Needs: No; Support System Lacking: No; Transportation Concerns: No Engineer, maintenance) Signed: 05/26/2022 10:45:22 AM By: Kalman Shan DO Signed: 05/26/2022 3:54:54 PM By: Deon Pilling RN, BSN Entered By: Kalman Shan on 05/26/2022  09:30:40 -------------------------------------------------------------------------------- SuperBill Details Patient Name: Date of Service: Brian Leon, RO NN P. 05/26/2022 Medical Record Number: [id number] Patient Account Number: [id number] Date of Birth/Sex: Treating RN: 1946-03-19 (76 y.o. 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 S41.101A Unspecified open wound of right upper arm, initial encounter T14.90XA Injury, unspecified, initial encounter M35.3 Polymyalgia rheumatica I87.2 Venous insufficiency (chronic) (peripheral) Facility Procedures CPT4 Code: [id number] Description: [phone] - DEBRIDE WOUND 1ST 20 SQ CM OR < ICD-10 Diagnosis Description O37.290 Non-pressure chronic ulcer of unspecified part of right lower leg with fat layer exp Modifier: osed Quantity: 1 CPT4 Code: [id number] Description: (Facility Use Only) [id number] - [place] LWR LT LEG Modifier: 32 Quantity: 1 Physician Procedures : CPT4 Code Description Modifier [id number] - WC PHYS DEBR WO ANESTH 20 SQ CM ICD-10 Diagnosis Description L75.300 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

## 2022-05-31 ENCOUNTER — Encounter (HOSPITAL_BASED_OUTPATIENT_CLINIC_OR_DEPARTMENT_OTHER): Payer: Medicare (Managed Care) | Admitting: Internal Medicine

## 2022-05-31 DIAGNOSIS — L97822 Non-pressure chronic ulcer of other part of left lower leg with fat layer exposed: Secondary | ICD-10-CM

## 2022-05-31 DIAGNOSIS — I872 Venous insufficiency (chronic) (peripheral): Secondary | ICD-10-CM

## 2022-05-31 DIAGNOSIS — T1490XA Injury, unspecified, initial encounter: Secondary | ICD-10-CM

## 2022-05-31 DIAGNOSIS — L97912 Non-pressure chronic ulcer of unspecified part of right lower leg with fat layer exposed: Secondary | ICD-10-CM | POA: Diagnosis not present

## 2022-05-31 NOTE — Progress Notes (Signed)
Brian, Leon ([id number]) Visit Report for 05/31/2022 Arrival Information Details Patient Name: Date of Service: Brian Leon, [name] 05/31/2022 8:15 A M Medical Record Number: [id number] Patient Account Number: [id number] Date of Birth/Sex: Treating RN: 1946/03/17 (76 y.o. Brian Leon, [name] Primary Care Buell Parcel: [name] Other Clinician: Referring Demba Nigh: Treating Sherry Blackard/Extender: [name] Weeks in Treatment: 28 Visit Information History Since Last Visit Added or deleted any medications: No Patient Arrived: Ambulatory Any new allergies or adverse reactions: No Arrival Time: 08:20 Had a fall or experienced change in No Accompanied By: self activities of daily living that may affect Transfer Assistance: None risk of falls: Patient Identification Verified: Yes Signs or symptoms of abuse/neglect since last visito No Secondary Verification Process Completed: Yes Hospitalized since last visit: No Patient Requires Transmission-Based Precautions: No Implantable device outside of the clinic excluding No Patient Has Alerts: Yes cellular tissue based products placed in the center Patient Alerts: 11/02/21 L ABI: 1.37 since last visit: 11/02/2021 L TBI 0.77 Has Dressing in Place as Prescribed: Yes 11/02/2021 R TBI 0.69 Has Compression in Place as Prescribed: Yes 11/02/2021 R ABI 1.3 Pain Present Now: No Electronic Signature(s) Signed: 05/31/2022 5:09:16 PM By: Deon Pilling RN, BSN Entered By: Deon Pilling on 05/31/2022 08:21:44 -------------------------------------------------------------------------------- Compression Therapy Details Patient Name: Date of Service: Brian Leon, RO NN P. 05/31/2022 8:15 A M Medical Record Number: [id number] Patient Account Number: [id number] Date of Birth/Sex: Treating RN: 1946/03/19 (76 y.o. Brian Leon Primary Care Elzena Muston: [name] Other Clinician: Referring Aryiana Klinkner: Treating Annesha Delgreco/Extender: [name] Weeks in Treatment: 28 Compression Therapy Performed for Wound Assessment: Wound #4 Right,Anterior Lower Leg Performed By: Clinician Deon Pilling, RN Compression Type: Three Layer Post Procedure Diagnosis Same as Pre-procedure Electronic Signature(s) Signed: 05/31/2022 5:09:16 PM By: Deon Pilling RN, BSN Entered By: Deon Pilling on 05/31/2022 08:30:11 -------------------------------------------------------------------------------- Encounter Discharge Information Details Patient Name: Date of Service: Brian Leon, RO NN P. 05/31/2022 8:15 A M Medical Record Number: [id number] Patient Account Number: [id number] Date of Birth/Sex: Treating RN: 1945/11/16 (76 y.o. Brian Leon Primary Care Temara Lanum: [name] Other Clinician: Referring Blu Mcglaun: Treating Netty Sullivant/Extender: [name] Weeks in Treatment: 90 Encounter Discharge Information Items Discharge Condition: Stable Ambulatory Status: Ambulatory Discharge Destination: Home Transportation: Private Auto Accompanied By: self Schedule Follow-up Appointment: Yes Clinical Summary of Care: Electronic Signature(s) Signed: 05/31/2022 5:09:16 PM By: Deon Pilling RN, BSN Entered By: Deon Pilling on 05/31/2022 08:58:30 -------------------------------------------------------------------------------- Lower Extremity Assessment Details Patient Name: Date of Service: Brian Leon, RO NN P. 05/31/2022 8:15 A M Medical Record Number: [id number] Patient Account Number: [id number] Date of Birth/Sex: Treating RN: 1946/04/21 (76 y.o. Brian Leon Primary Care Thomson Herbers: [name] Other Clinician: Referring Ryen Rhames: Treating Ikeem Cleckler/Extender: [name] Weeks in Treatment: 28 Edema Assessment Assessed: [Left: No] [Right: No] Edema: [Left: No] [Right: No] Calf Left: Right: Point of Measurement: 37 cm From Medial Instep 28 cm 28 cm Ankle Left: Right: Point of  Measurement: 12 cm From Medial Instep 19 cm 19 cm Vascular Assessment Pulses: Dorsalis Pedis Palpable: [Left:Yes] [Right:Yes] Posterior Tibial Palpable: [Left:Yes] [Right:Yes] Electronic Signature(s) Signed: 05/31/2022 5:09:16 PM By: Deon Pilling RN, BSN Entered By: Deon Pilling on 05/31/2022 08:24:46 -------------------------------------------------------------------------------- Multi Wound Chart Details Patient Name: Date of Service: Brian Leon, RO NN P. 05/31/2022 8:15 A M Medical Record Number: [id number] Patient Account Number: [id number] Date of Birth/Sex: Treating RN: [birth year] (76 y.o.  Brian Leon Primary Care Faithlynn Deeley: [name] Other Clinician: Referring Luisantonio Adinolfi: Treating Allyiah Gartner/Extender: [name] Weeks in Treatment: 28 Vital Signs Height(in): 68 Pulse(bpm): 61 Weight(lbs): 140 Blood Pressure(mmHg): 184/79 Body Mass Index(BMI): 21.3 Temperature(F): 97.9 Respiratory Rate(breaths/min): 20 Photos: [N/A:N/A] Left, Anterior Lower Leg Right, Anterior Lower Leg N/A Wound Location: Trauma Trauma N/A Wounding Event: Abrasion Abrasion N/A Primary Etiology: Asthma, Hypertension, Hepatitis A, Asthma, Hypertension, Hepatitis A, N/A Comorbid History: Rheumatoid Arthritis Rheumatoid Arthritis 01/04/2022 01/04/2022 N/A Date Acquired: 16 16 N/A Weeks of Treatment: Open Open N/A Wound Status: No No N/A Wound Recurrence: 0x0x0 0.3x0.9x0.1 N/A Measurements L x W x D (cm) 0 0.212 N/A A (cm) : rea 0 0.021 N/A Volume (cm) : 100.00% 35.80% N/A % Reduction in Area: 100.00% 36.40% N/A % Reduction in Volume: Full Thickness Without Exposed Full Thickness Without Exposed N/A Classification: Support Structures Support Structures None Present Medium N/A Exudate Amount: N/A Serosanguineous N/A Exudate Type: N/A red, brown N/A Exudate Color: Distinct, outline attached Distinct, outline attached N/A Wound Margin: None Present (0%) Small  (1-33%) N/A Granulation Amount: N/A Red, Pink N/A Granulation Quality: None Present (0%) Large (67-100%) N/A Necrotic Amount: Fascia: No Fat Layer (Subcutaneous Tissue): Yes N/A Exposed Structures: Fat Layer (Subcutaneous Tissue): No Fascia: No Tendon: No Tendon: No Muscle: No Muscle: No Joint: No Joint: No Bone: No Bone: No Large (67-100%) Large (67-100%) N/A Epithelialization: N/A Compression Therapy N/A Procedures Performed: Treatment Notes Wound #2 (Lower Leg) Wound Laterality: Left, Anterior Cleanser Peri-Wound Care Topical Primary Dressing Secondary Dressing Secured With Compression Wrap Compression Stockings Add-Ons Wound #4 (Lower Leg) Wound Laterality: Right, Anterior Cleanser Soap and Water Discharge Instruction: May shower and wash wound with dial antibacterial soap and water prior to dressing change. Peri-Wound Care Sween Lotion (Moisturizing lotion) Discharge Instruction: Apply moisturizing lotion as directed Topical Primary Dressing Xeroform Occlusive Gauze Dressing, 4x4 in Discharge Instruction: Apply to wound bed as instructed Secondary Dressing Woven Gauze Sponge, Non-Sterile 4x4 in Discharge Instruction: Apply over primary dressing as directed. Secured With Compression Wrap ThreePress (3 layer compression wrap) Discharge Instruction: Apply three layer compression as directed. Compression Stockings Add-Ons Electronic Signature(s) Signed: 05/31/2022 12:41:14 PM By: Kalman Shan DO Signed: 05/31/2022 5:09:16 PM By: Deon Pilling RN, BSN Entered By: Kalman Shan on 05/31/2022 09:08:19 -------------------------------------------------------------------------------- Multi-Disciplinary Care Plan Details Patient Name: Date of Service: Brian Leon, RO NN P. 05/31/2022 8:15 A M Medical Record Number: [id number] Patient Account Number: [id number] Date of Birth/Sex: Treating RN: 03/01/1946 (76 y.o. Brian Leon Primary Care Sydny Schnitzler: [name] Other Clinician: Referring Karesa Maultsby: Treating Dayane Hillenburg/Extender: [name] Weeks in Treatment: 28 Active Inactive Necrotic Tissue Nursing Diagnoses: Impaired tissue integrity related to necrotic/devitalized tissue Knowledge deficit related to management of necrotic/devitalized tissue Goals: Necrotic/devitalized tissue will be minimized in the wound bed Date Initiated: 02/03/2022 Target Resolution Date: 07/01/2022 Goal Status: Active Patient/caregiver will verbalize understanding of reason and process for debridement of necrotic tissue Date Initiated: 02/03/2022 Date Inactivated: 03/24/2022 Target Resolution Date: 04/01/2022 Goal Status: Met Interventions: Assess patient pain level pre-, during and post procedure and prior to discharge Provide education on necrotic tissue and debridement process Treatment Activities: Apply topical anesthetic as ordered : 02/03/2022 Excisional debridement : 02/03/2022 Notes: Electronic Signature(s) Signed: 05/31/2022 5:09:16 PM By: Deon Pilling RN, BSN Entered By: Deon Pilling on 05/31/2022 08:29:33 -------------------------------------------------------------------------------- Pain Assessment Details Patient Name: Date of Service: Brian Leon, RO NN P. 05/31/2022 8:15 A M Medical Record Number: [id number] Patient  Account Number: [id number] Date of Birth/Sex: Treating RN: Jun 28, 1946 (76 y.o. Brian Leon Primary Care Bertine Schlottman: [name] Other Clinician: Referring Mayson Mcneish: Treating Arieona Swaggerty/Extender: [name] Weeks in Treatment: 31 Active Problems Location of Pain Severity and Description of Pain Patient Has Paino No Site Locations Rate the pain. Current Pain Level: 0 Pain Management and Medication Current Pain Management: Medication: No Cold Application: No Rest: No Massage: No Activity: No T.E.N.S.: No Heat Application: No Leg drop or elevation: No Is the Current Pain Management  Adequate: Adequate How does your wound impact your activities of daily livingo Sleep: No Bathing: No Appetite: No Relationship With Others: No Bladder Continence: No Emotions: No Bowel Continence: No Work: No Toileting: No Drive: No Dressing: No Hobbies: No Engineer, maintenance) Signed: 05/31/2022 5:09:16 PM By: Deon Pilling RN, BSN Signed: 05/31/2022 5:09:16 PM By: Deon Pilling RN, BSN Entered By: Deon Pilling on 05/31/2022 08:22:45 -------------------------------------------------------------------------------- Patient/Caregiver Education Details Patient Name: Date of Service: Brian Leon, RO NN P. 8/29/2023andnbsp8:15 A M Medical Record Number: [id number] Patient Account Number: [id number] Date of Birth/Gender: Treating RN: [birth year] (76 y.o. Brian Leon Primary Care Physician: [name] Other Clinician: Referring Physician: Treating Physician/Extender: [name] Weeks in Treatment: 65 Education Assessment Education Provided To: Patient Education Topics Provided Wound Debridement: Handouts: Wound Debridement Methods: Explain/Verbal Responses: Reinforcements needed Electronic Signature(s) Signed: 05/31/2022 5:09:16 PM By: Deon Pilling RN, BSN Entered By: Deon Pilling on 05/31/2022 08:29:48 -------------------------------------------------------------------------------- Wound Assessment Details Patient Name: Date of Service: Brian Leon, RO NN P. 05/31/2022 8:15 A M Medical Record Number: [id number] Patient Account Number: [id number] Date of Birth/Sex: Treating RN: [birth year] (76 y.o. Brian Leon Primary Care Azalee Weimer: [name] Other Clinician: Referring Bena Kobel: Treating Adra Shepler/Extender: [name] Weeks in Treatment: 28 Wound Status Wound Number: 2 Primary Etiology: Abrasion Wound Location: Left, Anterior Lower Leg Wound Status: Open Wounding Event: Trauma Comorbid History: Asthma,  Hypertension, Hepatitis A, Rheumatoid Arthritis Date Acquired: 01/04/2022 Weeks Of Treatment: 16 Clustered Wound: No Photos Wound Measurements Length: (cm) Width: (cm) Depth: (cm) Area: (cm) Volume: (cm) 0 % Reduction in Area: 100% 0 % Reduction in Volume: 100% 0 Epithelialization: Large (67-100%) 0 Tunneling: No 0 Undermining: No Wound Description Classification: Full Thickness Without Exposed Support Structures Wound Margin: Distinct, outline attached Exudate Amount: None Present Foul Odor After Cleansing: No Slough/Fibrino No Wound Bed Granulation Amount: None Present (0%) Exposed Structure Necrotic Amount: None Present (0%) Fascia Exposed: No Fat Layer (Subcutaneous Tissue) Exposed: No Tendon Exposed: No Muscle Exposed: No Joint Exposed: No Bone Exposed: No Electronic Signature(s) Signed: 05/31/2022 4:34:50 PM By: Lorrin Jackson Signed: 05/31/2022 5:09:16 PM By: Deon Pilling RN, BSN Entered By: Lorrin Jackson on 05/31/2022 08:26:36 -------------------------------------------------------------------------------- Wound Assessment Details Patient Name: Date of Service: Brian Leon, RO NN P. 05/31/2022 8:15 A M Medical Record Number: [id number] Patient Account Number: [id number] Date of Birth/Sex: Treating RN: 1945-11-01 (76 y.o. Brian Leon Primary Care Lanissa Cashen: [name] Other Clinician: Referring Brennah Quraishi: Treating Gerhard Rappaport/Extender: [name] Weeks in Treatment: 28 Wound Status Wound Number: 4 Primary Etiology: Abrasion Wound Location: Right, Anterior Lower Leg Wound Status: Open Wounding Event: Trauma Comorbid History: Asthma, Hypertension, Hepatitis A, Rheumatoid Arthritis Date Acquired: 01/04/2022 Weeks Of Treatment: 16 Clustered Wound: No Photos Wound Measurements Length: (cm) 0.3 Width: (cm) 0.9 Depth: (cm) 0.1 Area: (cm) 0.212 Volume: (cm) 0.021 % Reduction in Area: 35.8% % Reduction in Volume:  36.4% Epithelialization: Large (  67-100%) Tunneling: No Undermining: No Wound Description Classification: Full Thickness Without Exposed Support Structures Wound Margin: Distinct, outline attached Exudate Amount: Medium Exudate Type: Serosanguineous Exudate Color: red, brown Foul Odor After Cleansing: No Slough/Fibrino Yes Wound Bed Granulation Amount: Small (1-33%) Exposed Structure Granulation Quality: Red, Pink Fascia Exposed: No Necrotic Amount: Large (67-100%) Fat Layer (Subcutaneous Tissue) Exposed: Yes Necrotic Quality: Adherent Slough Tendon Exposed: No Muscle Exposed: No Joint Exposed: No Bone Exposed: No Treatment Notes Wound #4 (Lower Leg) Wound Laterality: Right, Anterior Cleanser Soap and Water Discharge Instruction: May shower and wash wound with dial antibacterial soap and water prior to dressing change. Peri-Wound Care Sween Lotion (Moisturizing lotion) Discharge Instruction: Apply moisturizing lotion as directed Topical Primary Dressing Xeroform Occlusive Gauze Dressing, 4x4 in Discharge Instruction: Apply to wound bed as instructed Secondary Dressing Woven Gauze Sponge, Non-Sterile 4x4 in Discharge Instruction: Apply over primary dressing as directed. Secured With Compression Wrap ThreePress (3 layer compression wrap) Discharge Instruction: Apply three layer compression as directed. Compression Stockings Add-Ons Electronic Signature(s) Signed: 05/31/2022 4:34:50 PM By: Lorrin Jackson Signed: 05/31/2022 5:09:16 PM By: Deon Pilling RN, BSN Entered By: Lorrin Jackson on 05/31/2022 08:26:51 -------------------------------------------------------------------------------- Murphy Details Patient Name: Date of Service: Brian Leon, RO NN P. 05/31/2022 8:15 A M Medical Record Number: [id number] Patient Account Number: [id number] Date of Birth/Sex: Treating RN: [birth year] (76 y.o. Brian Leon Primary Care Christphor Groft: [name] Other  Clinician: Referring Jennessy Sandridge: Treating Chavy Avera/Extender: [name] Weeks in Treatment: 28 Vital Signs Time Taken: 08:20 Temperature (F): 97.9 Height (in): 68 Pulse (bpm): 61 Weight (lbs): 140 Respiratory Rate (breaths/min): 20 Body Mass Index (BMI): 21.3 Blood Pressure (mmHg): 184/79 Reference Range: 80 - 120 mg / dl Electronic Signature(s) Signed: 05/31/2022 5:09:16 PM By: Deon Pilling RN, BSN Entered By: Deon Pilling on 05/31/2022 08:22:37

## 2022-05-31 NOTE — Progress Notes (Signed)
Brian Leon ([id number]) Visit Report for 05/31/2022 Chief Complaint Document Details Patient Name: Date of Service: Brian Leon, [name] 05/31/2022 8:15 A M Medical Record Number: [id number] Patient Account Number: [id number] Date of Birth/Sex: Treating RN: 1945/12/04 (76 y.o. Brian Leon Primary Care Provider: Deland Leon Other Clinician: Referring Provider: Treating Provider/Extender: Brian Leon Weeks in Treatment: 29 Information Obtained from: Patient Chief Complaint 11/11/2021; Left lower extremity wound due to trauma 02/03/2022; bilateral lower extremity wounds due to trauma Electronic Signature(s) Signed: 05/31/2022 12:41:14 PM By: Brian Shan DO Entered By: Brian Leon on 05/31/2022 09:08:31 -------------------------------------------------------------------------------- HPI Details Patient Name: Date of Service: Brian Leon, [place] NN P. 05/31/2022 8:15 A M Medical Record Number: [id number] Patient Account Number: [id number] Date of Birth/Sex: Treating RN: 28-Jan-1946 (76 y.o. Brian Leon Primary Care Provider: Deland Leon Other Clinician: Referring Provider: Treating Provider/Extender: Brian Leon Weeks in Treatment: 28 History of Present Illness HPI Description: Admission 11/11/2021 Brian Leon is a 76 year old male with a past medical history of polymyalgia rheumatica that presents to the clinic for a 43-monthhistory of nonhealing wound to the left lower extremity. He states that on Halloween he hit an object and created a wound that has not healed. He states he has seen dermatology and his primary care physician for this issue. On one occasion he had an UHaematologist He has also used Xeroform. It does not sound like he uses any kind of wound dressing consistently. He currently reports chronic pain to the wound site. He denies systemic signs of infection. He denies purulent drainage. 2/16; patient presents for follow-up.  He has been using Hydrofera Blue and gentamicin ointment to the wound bed without issues. He reports improvement in wound healing. He currently denies signs of infection. 2/23; patient presents for follow-up. He continues to use Hydrofera Blue and gentamicin ointment to the wound bed without issues. He denies signs of infection. He reports improvement in his chronic pain to the wound bed. 3/2; patient presents for follow-up. He has been using Hydrofera Blue and gentamicin to the wound bed without issues. [place] He currently denies signs of infection. 3/24; patient presents for follow-up. [place] He enjoyed his trip. He reports no issues today. He has been using Hydrofera Blue and gentamicin ointment to the wound beds without issues. 4/7; patient presents for follow-up. He has been using Hydrofera Blue to the wound bed without issues. He reports that his wound is healed. 02/03/2022; patient was discharged 1 month ago with a closed wound to the left lower extremity. Unfortunately over the past month he has developed 3 new wounds he states was caused by hitting his legs against different objects. He has been keeping the areas covered with DuoDERM. He reports mild tenderness to the right lower extremity wound bed with increased redness. He denies purulent drainage. He states he has his previous wound care supplies of Hydrofera Blue and gentamicin ointment. 5/11; patient presents for follow-up. He had a PCR culture done at last clinic visit that showed high levels of Staph aureus and viridans and low levels of coagulase-negative staph. I started him on Augmentin at last clinic visit but he reports continued pain and erythema to the wound sites. He has been using gentamicin ointment and Hydrofera Blue with dressing changes. He also presents with a 100.2 temp  cough and diarrhea. He has not taken anything for  his symptoms. 5/18; patient presents for follow-up. He received his Keystone antibiotics and started using this. Unfortunately he mixed it incorrectly and it turned into a hardened substance. He had to use his refill to start all over again. He has only been using this for 1 day. He states he is combining this correctly now. He completed his course of clindamycin. He denies signs of infection. 5/25; patient presents for follow-up. He has been using Keystone antibiotics with Sorbact. He reports more serous drainage from the left lower extremity. Other than that he has no issues or complaints. He denies signs of infection. 6/1; patient presents for follow-up. We switched to Iodoflex and Keystone antibiotic under compression therapy at last clinic visit. He tolerated this treatment well. He has no issues or complaints today. He denies signs of infection. Unfortunately he will be out of town for the next 3 weeks. 6/22; patient presents for follow-up. He was in [place] for the past 3 weeks. He has been using compression stockings daily along with [facility] antibiotic and Hydrofera Blue. Unfortunately he developed a skin tear to his right arm after hitting a door. 6/29; patient presents for follow-up. We have been using Keystone antibiotics with PolyMem silver to the lower extremities under 3 layer compression. Apparently he has a skin tear to the lateral Aspect of the right leg that he states was there previously. I had not seen it before. His skin tear on his right arm is well-healing with the use of bacitracin. He has no issues or complaints today. 7/6; patient presents for follow-up. We have been using Keystone antibiotics with PolyMem silver to the lower extremities under 3 layer compression. And antibiotic ointment to the right lateral leg wound. Patient reports improvement in wound healing. 7/13; patient presents for follow-up. We have been using Keystone antibiotics and PolyMem silver to all the  wound beds. Patient denies signs of infection. He tolerated the compression wraps well. He is currently being treated for C. difficile with vancomycin. 7/20; patient presents for follow-up. We have been using Keystone antibiotic and PolyMem silver to all wound beds. He continues to do well with compression wraps bilaterally. He has no issues or complaints today. 7/27; patient presents for follow-up. We have been using Keystone antibiotic and PolyMem silver to the wound beds under 3 layer compression. The right medial wound has healed. He has 2 remaining wounds. He denies signs of infection. 8/3; patient presents for follow-up. We have been using collagen under 3 layer compression. He has no issues or complaints today. 8/10; patient presents for follow-up. We have been using collagen under 3 layer compression. He has no issues or complaints today. 8/24; patient presents for follow-up. We have been using collagen due to 3 layer compression to both lower extremities. Patient has no issues or complaints today. 8/29; patient presents for follow-up. We have been doing Xeroform under compression therapy to the left leg. This wound is healed. We have been doing collagen to the right lower extremity wound also under compression therapy. Patient has his compression stockings today. Electronic Signature(s) Signed: 05/31/2022 12:41:14 PM By: Brian Shan DO Entered By: Brian Leon on 05/31/2022 09:09:05 -------------------------------------------------------------------------------- Physical Exam Details Patient Name: Date of Service: Brian Leon, RO NN P. 05/31/2022 8:15 A M Medical Record Number: [id number] Patient Account Number: [id number] Date of Birth/Sex: Treating RN: 1946/08/09 (76 y.o. Brian Leon Primary Care Provider: Deland Leon Other Clinician: Referring Provider: Treating Provider/Extender:  Alyra Patty ZHENG, JA CKY Weeks in Treatment: 28 Constitutional respirations  regular, non-labored and within target range for patient.. Cardiovascular 2+ dorsalis pedis/posterior tibialis pulses. Psychiatric pleasant and cooperative. Notes Left lower extremity: Epithelization to the previous wound site Right lower extremity: Granulation tissue with mostly epithelization to the wound bed. Good edema control. No signs of surrounding infection to any of the wound beds Electronic Signature(s) Signed: 05/31/2022 12:41:14 PM By: Brian Shan DO Entered By: Brian Leon on 05/31/2022 09:09:57 -------------------------------------------------------------------------------- Physician Orders Details Patient Name: Date of Service: Brian Leon, [place] NN P. 05/31/2022 8:15 A M Medical Record Number: [id number] Patient Account Number: [id number] Date of Birth/Sex: Treating RN: January 16, 1946 (76 y.o. 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 - Dr. Heber [address] and [place], Room 8 Thursday Return A ppointment in 2 weeks. - Dr. Heber [place] and [place], Room 8 Thursday Return A Other: - Bring in your compression stockings next week. Anesthetic Wound #4 Right,Anterior Lower Leg (In clinic) Topical Lidocaine 5% applied to wound bed Bathing/ Shower/ Hygiene May shower with protection but do not get wound dressing(s) wet. Edema Control - Lymphedema / SCD /  Other Elevate legs to the level of the heart or above for 30 minutes daily and/or when sitting, a frequency of: - 3-4 times a day throughout the day. Avoid standing for long periods of time. Exercise regularly Moisturize legs daily. - left leg every night before bed. Compression stocking or Garment 20-30 mm/Hg pressure to: - apply to left leg in the morning and remove at night. Wound Treatment Wound #4 - Lower Leg Wound Laterality: Right, Anterior Cleanser: Soap and Water 1 x Per Week/30 Days Discharge Instructions: May shower and wash wound with dial antibacterial soap and water prior to dressing change. Peri-Wound Care: Sween Lotion (Moisturizing lotion) 1 x Per Week/30 Days Discharge Instructions: Apply moisturizing lotion as directed Prim Dressing: Xeroform Occlusive Gauze Dressing, 4x4 in 1 x Per Week/30 Days ary Discharge Instructions: Apply to wound bed as instructed Secondary Dressing: Woven Gauze Sponge, Non-Sterile 4x4 in 1 x Per Week/30 Days Discharge Instructions: Apply over primary dressing as directed. Compression Wrap: ThreePress (3 layer compression wrap) 1 x Per Week/30 Days Discharge Instructions: Apply three layer compression as directed. Electronic Signature(s) Signed: 05/31/2022 12:41:14 PM By: Brian Shan DO Entered By: Brian Leon on 05/31/2022 09:10:04 -------------------------------------------------------------------------------- Problem List Details Patient Name: Date of Service: Brian Leon, [place] NN P. 05/31/2022 8:15 A M Medical Record Number: [id number] Patient Account Number: [id number] Date of Birth/Sex: Treating RN: 1946/03/11 (76 y.o. [name], [name] Primary Care Provider: Deland Leon Other Clinician: Referring Provider: Treating Provider/Extender: Brian Leon Weeks in Treatment: 28 Active Problems ICD-10 Encounter Code Description Active Date MDM Diagnosis L97.822 Non-pressure chronic ulcer of other part of left lower  leg with fat layer exposed2/06/2022 No Yes L97.912 Non-pressure chronic ulcer of unspecified part of right lower leg with fat layer 02/03/2022 No Yes exposed S41.101A Unspecified open wound of right upper arm, initial encounter  03/24/2022 No Yes T14.90XA Injury, unspecified, initial encounter 11/11/2021 No Yes M35.3 Polymyalgia rheumatica 11/11/2021 No Yes I87.2 Venous insufficiency (chronic) (peripheral) 02/24/2022 No Yes Inactive Problems Resolved Problems Electronic Signature(s) Signed: 05/31/2022 12:41:14 PM By: Brian Shan DO Entered By: Brian Leon on 05/31/2022 09:08:11 -------------------------------------------------------------------------------- Progress Note Details Patient Name: Date of Service: Brian Leon, [place] NN P. 05/31/2022 8:15 A M Medical Record Number: [id number] Patient Account Number: [id number] Date of Birth/Sex: Treating RN: 02-Mar-1946 (76 y.o. Brian Leon Primary Care Provider: Deland Leon Other Clinician: Referring Provider: Treating Provider/Extender: Brian Leon Weeks in Treatment: 55 Subjective Chief Complaint Information obtained from Patient 11/11/2021; Left lower extremity wound due to trauma 02/03/2022; bilateral lower extremity wounds due to trauma History of Present Illness (HPI) Admission 11/11/2021 [name] is a 76 year old male with a past medical history of polymyalgia rheumatica that presents to the clinic for a 71-monthhistory of nonhealing wound to the left lower extremity. He states that on Halloween he hit an object and created a wound that has not healed. He states he has seen dermatology and his primary care physician for this issue. On one occasion he had an UHaematologist He has also used Xeroform. It does not sound like he uses any kind of wound dressing consistently. He currently reports chronic pain to the wound site. He denies systemic signs of infection. He denies purulent drainage. 2/16; patient presents for  follow-up. He has been using Hydrofera Blue and gentamicin ointment to the wound bed without issues. He reports improvement in wound healing. He currently denies signs of infection. 2/23; patient presents for follow-up. He continues to use Hydrofera Blue and gentamicin ointment to the wound bed without issues. He denies signs of infection. He reports improvement in his chronic pain to the wound bed. 3/2; patient presents for follow-up. He has been using Hydrofera Blue and gentamicin to the wound bed without issues. [place] He currently denies signs of infection. 3/24; patient presents for follow-up. [place]. He enjoyed his trip. He reports no issues today. He has been using Hydrofera Blue and gentamicin ointment to the wound beds without issues. 4/7; patient presents for follow-up. He has been using Hydrofera Blue to the wound bed without issues. He reports that his wound is healed. 02/03/2022; patient was discharged 1 month ago with a closed wound to the left lower extremity. Unfortunately over the past month he has developed 3 new wounds he states was caused by hitting his legs against different objects. He has been keeping the areas covered with DuoDERM. He reports mild tenderness to the right lower extremity wound bed with increased redness. He denies purulent drainage. He states he has his previous wound care supplies of Hydrofera Blue and gentamicin ointment. 5/11; patient presents for follow-up. He had a PCR culture done at last clinic visit that showed high levels of Staph aureus and viridans and low levels of coagulase-negative staph. I started him on Augmentin at last clinic visit but he reports continued pain and erythema to the wound sites. He has been using gentamicin ointment and Hydrofera Blue with dressing changes. He also presents with a 100.2 temp cough and diarrhea. He has not taken  anything for his symptoms. 5/18; patient presents for follow-up. He received his Keystone antibiotics and started using this. Unfortunately he mixed it incorrectly and it turned into a hardened  substance. He had to use his refill to start all over again. He has only been using this for 1 day. He states he is combining this correctly now. He completed his course of clindamycin. He denies signs of infection. 5/25; patient presents for follow-up. He has been using Keystone antibiotics with Sorbact. He reports more serous drainage from the left lower extremity. Other than that he has no issues or complaints. He denies signs of infection. 6/1; patient presents for follow-up. We switched to Iodoflex and Keystone antibiotic under compression therapy at last clinic visit. He tolerated this treatment well. He has no issues or complaints today. He denies signs of infection. Unfortunately he will be out of town for the next 3 weeks. 6/22; patient presents for follow-up. He was in [place] for the past 3 weeks. He has been using compression stockings daily along with [facility] antibiotic and Hydrofera Blue. Unfortunately he developed a skin tear to his right arm after hitting a door. 6/29; patient presents for follow-up. We have been using Keystone antibiotics with PolyMem silver to the lower extremities under 3 layer compression. Apparently he has a skin tear to the lateral Aspect of the right leg that he states was there previously. I had not seen it before. His skin tear on his right arm is well-healing with the use of bacitracin. He has no issues or complaints today. 7/6; patient presents for follow-up. We have been using Keystone antibiotics with PolyMem silver to the lower extremities under 3 layer compression. And antibiotic ointment to the right lateral leg wound. Patient reports improvement in wound healing. 7/13; patient presents for follow-up. We have been using Keystone antibiotics and PolyMem silver  to all the wound beds. Patient denies signs of infection. He tolerated the compression wraps well. He is currently being treated for C. difficile with vancomycin. 7/20; patient presents for follow-up. We have been using Keystone antibiotic and PolyMem silver to all wound beds. He continues to do well with compression wraps bilaterally. He has no issues or complaints today. 7/27; patient presents for follow-up. We have been using Keystone antibiotic and PolyMem silver to the wound beds under 3 layer compression. The right medial wound has healed. He has 2 remaining wounds. He denies signs of infection. 8/3; patient presents for follow-up. We have been using collagen under 3 layer compression. He has no issues or complaints today. 8/10; patient presents for follow-up. We have been using collagen under 3 layer compression. He has no issues or complaints today. 8/24; patient presents for follow-up. We have been using collagen due to 3 layer compression to both lower extremities. Patient has no issues or complaints today. 8/29; patient presents for follow-up. We have been doing Xeroform under compression therapy to the left leg. This wound is healed. We have been doing collagen to the right lower extremity wound also under compression therapy. Patient has his compression stockings today. Patient History Information obtained from Chart. Family History Cancer - Mother, No family history of Diabetes, Heart Disease, Hypertension, Kidney Disease, Lung Disease, Seizures, Stroke, Thyroid Problems, Tuberculosis. Social History Former smoker - quit 40 years ago, Marital Status - Married, Alcohol Use - Never, Drug Use - Current History - marijuana, Caffeine Use - Rarely. Medical History Eyes Denies history of Cataracts, Glaucoma, Optic Neuritis Ear/Nose/Mouth/Throat Denies history of Chronic sinus problems/congestion, Middle ear problems Hematologic/Lymphatic Denies history of Anemia, Hemophilia, Human  Immunodeficiency Virus, Lymphedema, Sickle Cell Disease Respiratory Patient has history of Asthma Denies history of Aspiration, Chronic Obstructive Pulmonary Disease (  COPD), Pneumothorax, Sleep Apnea, Tuberculosis Cardiovascular Patient has history of Hypertension Denies history of Angina, Arrhythmia, Congestive Heart Failure, Coronary Artery Disease, Deep Vein Thrombosis, Hypotension, Myocardial Infarction, Peripheral Arterial Disease, Peripheral Venous Disease, Phlebitis, Vasculitis Gastrointestinal Patient has history of Hepatitis A Denies history of Cirrhosis , Colitis, Crohnoos, Hepatitis B, Hepatitis C Endocrine Denies history of Type I Diabetes, Type II Diabetes Genitourinary Denies history of End Stage Renal Disease Immunological Denies history of Lupus Erythematosus, Raynaudoos, Scleroderma Integumentary (Skin) Denies history of History of Burn Musculoskeletal Patient has history of Rheumatoid Arthritis Denies history of Gout, Osteoarthritis, Osteomyelitis Psychiatric Denies history of Anorexia/bulimia, Confinement Anxiety Hospitalization/Surgery History - 5 years ago Renal Cell carcinoma. 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 Vitals Time Taken: 8:20 AM, Height: 68 in, Weight: 140 lbs, BMI: 21.3, Temperature: 97.9 F, Pulse: 61 bpm, Respiratory Rate: 20 breaths/min, Blood Pressure: 184/79 mmHg. Cardiovascular 2+ dorsalis pedis/posterior  tibialis pulses. Psychiatric pleasant and cooperative. General Notes: Left lower extremity: Epithelization to the previous wound site Right lower extremity: Granulation tissue with mostly epithelization to the wound bed. Good edema control. No signs of surrounding infection to any of the wound beds Integumentary (Hair, Skin) Wound #2 status is Open. Original cause of wound was Trauma. The date acquired was: 01/04/2022. The wound has been in treatment 16 weeks. The wound is located on the Left,Anterior Lower Leg. The wound measures 0cm length x 0cm width x 0cm depth; 0cm^2 area and 0cm^3 volume. There is no tunneling or undermining noted. There is a none present amount of drainage noted. The wound margin is distinct with the outline attached to the wound base. There is no granulation within the wound bed. There is no necrotic tissue within the wound bed. Wound #4 status is Open. Original cause of wound was Trauma. The date acquired was: 01/04/2022. The wound has been in treatment 16 weeks. The wound is located on the Right,Anterior Lower Leg. The wound measures 0.3cm length x 0.9cm width x 0.1cm depth; 0.212cm^2 area and 0.021cm^3 volume. There is Fat Layer (Subcutaneous Tissue) exposed. There is no tunneling or undermining noted. There is a medium amount of serosanguineous drainage noted. The wound margin is distinct with the outline attached to the wound base. There is small (1-33%) red, pink granulation within the wound bed. There is a large (67-100%) amount of necrotic tissue within the wound bed including Adherent Slough. 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 This wound is healed. I recommended compression stocking daily to this leg. The right lower extremity wound has improved in size in appearance since last clinic visit. This wound appears almost healed. I recommended Xeroform under 3 layer compression here. Follow-up in 1 week. Procedures Wound #4 Pre-procedure diagnosis of Wound #4 is an Abrasion located on the Right,Anterior Lower Leg . There was a  Three Layer Compression Therapy Procedure by Deon Pilling, RN. Post procedure Diagnosis Wound #4: Same as Pre-Procedure Plan Follow-up Appointments: Return Appointment in 1 week. - Dr. Heber [place] and [name], Room 8 Thursday Return Appointment in 2 weeks. - Dr. Heber [place] and [place], Room 8 Thursday Other: - Bring in your compression stockings next week. Anesthetic: Wound #4 Right,Anterior Lower Leg: (In clinic) Topical Lidocaine 5% applied to wound bed Bathing/ Shower/ Hygiene: May shower with protection but do not get wound dressing(s) wet. Edema Control - Lymphedema / SCD / Other: Elevate legs to the level of the heart or above for 30 minutes daily and/or when sitting, a frequency of: - 3-4 times a day throughout the day. Avoid standing for long periods of time. Exercise regularly Moisturize legs daily. - left leg every night before bed. Compression stocking or Garment 20-30 mm/Hg pressure to: - apply to left leg in the morning and remove at night. WOUND #4: - Lower Leg Wound Laterality: Right, Anterior Cleanser: Soap and Water 1 x Per Week/30 Days Discharge Instructions: May shower and wash wound with dial antibacterial soap and water prior to dressing change. Peri-Wound Care: Sween Lotion (Moisturizing lotion) 1 x Per Week/30 Days Discharge Instructions: Apply moisturizing lotion as directed Prim Dressing: Xeroform Occlusive Gauze Dressing, 4x4 in 1 x Per Week/30 Days ary Discharge  Instructions: Apply to wound bed as instructed Secondary Dressing: Woven Gauze Sponge, Non-Sterile 4x4 in 1 x Per Week/30 Days Discharge Instructions: Apply over primary dressing as directed. Com pression Wrap: ThreePress (3 layer compression wrap) 1 x Per Week/30 Days Discharge Instructions: Apply three layer compression as directed. 1. Xeroform under 3 layer compression - right lower extremity 2. Follow-up in 1 week Electronic Signature(s) Signed: 05/31/2022 12:41:14 PM By: Brian Shan DO Entered By: Brian Leon on 05/31/2022 09:11:27 -------------------------------------------------------------------------------- HxROS Details Patient Name: Date of Service: Brian Leon, RO NN P. 05/31/2022 8:15 A M Medical Record Number: [id number] Patient Account Number: [id number] Date of Birth/Sex: Treating RN: Sep 10, 1946 (76 y.o. Brian Leon Primary Care Provider: Deland Leon Other Clinician: Referring Provider: Treating Provider/Extender: Brian Leon Weeks in Treatment: 28 Information Obtained From Chart Eyes Medical History: Negative for: Cataracts; Glaucoma; Optic Neuritis Past Medical History Notes: Blind right eye WEARS PROSTHESIS Ear/Nose/Mouth/Throat Medical History: Negative for: Chronic sinus problems/congestion; Middle ear problems Past Medical History Notes: Allergic rhinitis Hematologic/Lymphatic Medical History: Negative for: Anemia; Hemophilia; Human Immunodeficiency Virus; Lymphedema; Sickle Cell Disease Respiratory Medical History: Positive for: Asthma Negative for: Aspiration; Chronic Obstructive Pulmonary Disease (COPD); Pneumothorax; Sleep Apnea; Tuberculosis Past Medical History Notes: Pneumonia Cardiovascular Medical History: Positive for: Hypertension Negative for: Angina; Arrhythmia; Congestive Heart Failure; Coronary Artery Disease; Deep Vein Thrombosis; Hypotension; Myocardial Infarction; Peripheral Arterial Disease;  Peripheral Venous Disease; Phlebitis; Vasculitis Past Medical History Notes: Thoracic aortic aneurysm Thrombocytosis Gastrointestinal Medical History: Positive for: Hepatitis A Negative for: Cirrhosis ; Colitis; Crohns; Hepatitis B; Hepatitis C Endocrine Medical History: Negative for: Type I Diabetes; Type II Diabetes Genitourinary Medical History: Negative for: End Stage Renal Disease Past Medical History Notes: 1/3kidney missing per patient-Renal Cell carcinoma Renal Cyst 5 years ago Immunological Medical History: Negative for: Lupus Erythematosus; Raynauds; Scleroderma Integumentary (Skin) Medical History: Negative for: History of Burn Musculoskeletal Medical History: Positive for: Rheumatoid Arthritis Negative for: Gout; Osteoarthritis; Osteomyelitis Past Medical History Notes: 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 Eczema Neurologic Medical History: Past Medical History Notes: Paraureteric diverticulum Oncologic Medical History: Past Medical History Notes: Renal Cell carcinoma- 5 years  ago Psychiatric Medical History: Negative for: Anorexia/bulimia; Confinement Anxiety Immunizations Pneumococcal Vaccine: Received Pneumococcal Vaccination: Yes Received Pneumococcal Vaccination On or After 60th Birthday: Yes Implantable Devices No devices added Hospitalization / Surgery History Type of Hospitalization/Surgery 5 years ago Renal Cell carcinoma Family and Social History Cancer: Yes - Mother; Diabetes: No; Heart Disease: No; Hypertension: No; Kidney Disease: No; Lung Disease: No; Seizures: No; Stroke: No; Thyroid Problems: No; Tuberculosis: No; Former smoker - quit 40 years ago; Marital Status - Married; Alcohol Use: Never; Drug Use: Current History - marijuana; Caffeine Use: Rarely; Financial Concerns: No; Food, Clothing or Shelter Needs: No; Support System Lacking: No; Transportation Concerns: No Electronic  Signature(s) Signed: 05/31/2022 12:41:14 PM By: Brian Shan DO Signed: 05/31/2022 5:09:16 PM By: Deon Pilling RN, BSN Entered By: Brian Leon on 05/31/2022 09:09:11 -------------------------------------------------------------------------------- [place] Details Patient Name: Date of Service: Brian Leon, RO NN P. 05/31/2022 Medical Record Number: [id number] Patient Account Number: [id number] Date of Birth/Sex: Treating RN: [birth year] (76 y.o. 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) Facility Procedures CPT4 Code: [id number] Description: (Facility Use Only) [phone] - [facility] COMPRS LWR RT LEG Modifier: Quantity: 1 Physician Procedures : CPT4 Code Description Modifier [id number] - WC PHYS LEVEL 3 - EST PT ICD-10 Diagnosis Description L97.822 Non-pressure chronic ulcer of other part of left lower leg with fat layer exposed V94.801 Non-pressure chronic ulcer of unspecified part of  right lower leg with fat layer exposed 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

## 2022-06-09 ENCOUNTER — Encounter (HOSPITAL_BASED_OUTPATIENT_CLINIC_OR_DEPARTMENT_OTHER): Payer: Medicare (Managed Care) | Attending: Internal Medicine | Admitting: Internal Medicine

## 2022-06-09 DIAGNOSIS — L97822 Non-pressure chronic ulcer of other part of left lower leg with fat layer exposed: Secondary | ICD-10-CM

## 2022-06-09 DIAGNOSIS — Z09 Encounter for follow-up examination after completed treatment for conditions other than malignant neoplasm: Secondary | ICD-10-CM | POA: Insufficient documentation

## 2022-06-09 DIAGNOSIS — M353 Polymyalgia rheumatica: Secondary | ICD-10-CM | POA: Insufficient documentation

## 2022-06-09 DIAGNOSIS — I872 Venous insufficiency (chronic) (peripheral): Secondary | ICD-10-CM | POA: Diagnosis not present

## 2022-06-09 DIAGNOSIS — L97912 Non-pressure chronic ulcer of unspecified part of right lower leg with fat layer exposed: Secondary | ICD-10-CM | POA: Diagnosis not present

## 2022-06-09 DIAGNOSIS — Z872 Personal history of diseases of the skin and subcutaneous tissue: Secondary | ICD-10-CM | POA: Diagnosis not present

## 2022-06-09 NOTE — Progress Notes (Signed)
RAZA, BAYLESS ([id number]) Visit Report for 06/09/2022 Chief Complaint Document Details Patient Name: Date of Service: Brian Leon, [name] 06/09/2022 9:00 A M Medical Record Number: [id number] Patient Account Number: [id number] Date of Birth/Sex: Treating RN: May 28, 1946 (76 y.o. M) Primary Care Provider: [name] Other Clinician: Referring Provider: Treating Provider/Extender: [name] Weeks in Treatment: 30 Information Obtained from: Patient Chief Complaint 11/11/2021; Left lower extremity wound due to trauma 02/03/2022; bilateral lower extremity wounds due to trauma Electronic Signature(s) Signed: 06/09/2022 1:45:58 PM By: Kalman Shan DO Entered By: Kalman Shan on 06/09/2022 10:26:47 -------------------------------------------------------------------------------- HPI Details Patient Name: Date of Service: Brian Leon, RO NN P. 06/09/2022 9:00 A M Medical Record Number: [id number] Patient Account Number: [id number] Date of Birth/Sex: Treating RN: 1946-08-28 (76 y.o. M) Primary Care Provider: [name] Other Clinician: Referring Provider: Treating Provider/Extender: [name] Weeks in Treatment: 30 History of Present Illness HPI Description: Admission 11/11/2021 Brian Leon is a 76 year old male with a past medical history of polymyalgia rheumatica that presents to the clinic for a 62-monthhistory of nonhealing wound to the left lower extremity. He states that on Halloween he hit an object and created a wound that has not healed. He states he has seen dermatology and his primary care physician for this issue. On one occasion he had an UHaematologist He has also used Xeroform. It does not sound like he uses any kind of wound dressing consistently. He currently reports chronic pain to the wound site. He denies systemic signs of infection. He denies purulent drainage. 2/16; patient presents for follow-up. He has been using Hydrofera Blue  and gentamicin ointment to the wound bed without issues. He reports improvement in wound healing. He currently denies signs of infection. 2/23; patient presents for follow-up. He continues to use Hydrofera Blue and gentamicin ointment to the wound bed without issues. He denies signs of infection. He reports improvement in his chronic pain to the wound bed. 3/2; patient presents for follow-up. He has been using Hydrofera Blue and gentamicin to the wound bed without issues. [place] He currently denies signs of infection. 3/24; patient presents for follow-up. [place] He enjoyed his trip. He reports no issues today. He has been using Hydrofera Blue and gentamicin ointment to the wound beds without issues. 4/7; patient presents for follow-up. He has been using Hydrofera Blue to the wound bed without issues. He reports that his wound is healed. 02/03/2022; patient was discharged 1 month ago with a closed wound to the left lower extremity. Unfortunately over the past month he has developed 3 new wounds he states was caused by hitting his legs against different objects. He has been keeping the areas covered with DuoDERM. He reports mild tenderness to the right lower extremity wound bed with increased redness. He denies purulent drainage. He states he has his previous wound care supplies of Hydrofera Blue and gentamicin ointment. 5/11; patient presents for follow-up. He had a PCR culture done at last clinic visit that showed high levels of Staph aureus and viridans and low levels of coagulase-negative staph. I started him on Augmentin at last clinic visit but he reports continued pain and erythema to the wound sites. He has been using gentamicin ointment and Hydrofera Blue with dressing changes. He also presents with a 100.2 temp cough and diarrhea. He  has not taken anything for his symptoms. 5/18; patient  presents for follow-up. He received his Keystone antibiotics and started using this. Unfortunately he mixed it incorrectly and it turned into a hardened substance. He had to use his refill to start all over again. He has only been using this for 1 day. He states he is combining this correctly now. He completed his course of clindamycin. He denies signs of infection. 5/25; patient presents for follow-up. He has been using Keystone antibiotics with Sorbact. He reports more serous drainage from the left lower extremity. Other than that he has no issues or complaints. He denies signs of infection. 6/1; patient presents for follow-up. We switched to Iodoflex and Keystone antibiotic under compression therapy at last clinic visit. He tolerated this treatment well. He has no issues or complaints today. He denies signs of infection. Unfortunately he will be out of town for the next 3 weeks. 6/22; patient presents for follow-up. He was in [place] for the past 3 weeks. He has been using compression stockings daily along with [facility] antibiotic and Hydrofera Blue. Unfortunately he developed a skin tear to his right arm after hitting a door. 6/29; patient presents for follow-up. We have been using Keystone antibiotics with PolyMem silver to the lower extremities under 3 layer compression. Apparently he has a skin tear to the lateral Aspect of the right leg that he states was there previously. I had not seen it before. His skin tear on his right arm is well-healing with the use of bacitracin. He has no issues or complaints today. 7/6; patient presents for follow-up. We have been using Keystone antibiotics with PolyMem silver to the lower extremities under 3 layer compression. And antibiotic ointment to the right lateral leg wound. Patient reports improvement in wound healing. 7/13; patient presents for follow-up. We have been using Keystone antibiotics and PolyMem silver to all the wound beds. Patient denies signs  of infection. He tolerated the compression wraps well. He is currently being treated for C. difficile with vancomycin. 7/20; patient presents for follow-up. We have been using Keystone antibiotic and PolyMem silver to all wound beds. He continues to do well with compression wraps bilaterally. He has no issues or complaints today. 7/27; patient presents for follow-up. We have been using Keystone antibiotic and PolyMem silver to the wound beds under 3 layer compression. The right medial wound has healed. He has 2 remaining wounds. He denies signs of infection. 8/3; patient presents for follow-up. We have been using collagen under 3 layer compression. He has no issues or complaints today. 8/10; patient presents for follow-up. We have been using collagen under 3 layer compression. He has no issues or complaints today. 8/24; patient presents for follow-up. We have been using collagen due to 3 layer compression to both lower extremities. Patient has no issues or complaints today. 8/29; patient presents for follow-up. We have been doing Xeroform under compression therapy to the left leg. This wound is healed. We have been doing collagen to the right lower extremity wound also under compression therapy. Patient has his compression stockings today. 9/7; patient presents for follow-up. We have been doing Xeroform under 3 layer compression to the right lower extremity. He has been using his compression stocking to the left lower extremity. He has no issues or complaints today. Electronic Signature(s) Signed: 06/09/2022 1:45:58 PM By: Kalman Shan DO Entered By: Kalman Shan on 06/09/2022 10:27:23 -------------------------------------------------------------------------------- Physical Exam Details Patient Name: Date of Service: Brian Leon, [place] NN P. 06/09/2022  9:00 A M Medical Record Number: [id number] Patient Account Number: [id number] Date of Birth/Sex: Treating RN: 01-13-1946 (76 y.o. M) Primary Care  Provider: [name] Other Clinician: Referring Provider: Treating Provider/Extender: [name] Weeks in Treatment: 30 Constitutional respirations regular, non-labored and within target range for patient.. Cardiovascular 2+ dorsalis pedis/posterior tibialis pulses. Psychiatric pleasant and cooperative. Notes Right lower extremity: Epithelization to the previous wound site. Good edema control. No open wounds to legs bilaterally. Electronic Signature(s) Signed: 06/09/2022 1:45:58 PM By: Kalman Shan DO Entered By: Kalman Shan on 06/09/2022 10:27:54 -------------------------------------------------------------------------------- Physician Orders Details Patient Name: Date of Service: Brian Leon, [place] NN P. 06/09/2022 9:00 A M Medical Record Number: [id number] Patient Account Number: [id number] Date of Birth/Sex: Treating RN: [birth year] (76 y.o. 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 Wear your stockings daily. lotion every night before bed. Edema Control - Lymphedema / SCD / Other Elevate legs to the level of the heart or above for 30 minutes daily and/or when sitting, a frequency of: - 3-4 times a day throughout the day. Avoid standing for long periods of time. Patient  to wear own compression stockings every day. Compression stocking or Garment 20-30 mm/Hg pressure to: - both legs. Electronic Signature(s) Signed: 06/09/2022 1:45:58 PM By: Kalman Shan DO Entered By: Kalman Shan on 06/09/2022 10:27:59 -------------------------------------------------------------------------------- Problem List Details Patient Name: Date of Service: Brian Leon, RO NN P. 06/09/2022 9:00 A M Medical Record Number: [id number] Patient Account Number: [id number] Date of Birth/Sex: Treating RN: 06-03-1946 (76 y.o. [name], [name] Primary Care Provider: [name] Other Clinician: Referring Provider: Treating Provider/Extender: [name] Weeks in Treatment: 30 Active Problems ICD-10 Encounter Code Description Active Date MDM Diagnosis [phone] Non-pressure chronic ulcer of other part of left lower leg with fat layer exposed2/06/2022 No Yes L97.912 Non-pressure chronic ulcer of unspecified part of right lower leg with fat layer 02/03/2022 No Yes exposed S41.101A Unspecified open wound of right upper arm, initial encounter 03/24/2022 No Yes T14.90XA Injury, unspecified, initial encounter 11/11/2021 No Yes M35.3 Polymyalgia rheumatica 11/11/2021 No Yes I87.2 Venous insufficiency (chronic) (peripheral) 02/24/2022 No Yes Inactive Problems Resolved Problems Electronic Signature(s) Signed: 06/09/2022 1:45:58 PM By: Kalman Shan DO Entered By: Kalman Shan on 06/09/2022 10:26:30 -------------------------------------------------------------------------------- Progress Note Details Patient Name: Date of Service: Brian Leon, [place] NN P. 06/09/2022 9:00 A M Medical Record Number: [id number] Patient Account Number: [id number] Date of Birth/Sex: Treating RN: 1945/11/29 (76 y.o. M) Primary Care Provider: [name] Other Clinician: Referring Provider: Treating Provider/Extender: [name] Weeks in Treatment: 30 Subjective Chief  Complaint Information obtained from Patient 11/11/2021; Left lower extremity wound due to trauma 02/03/2022; bilateral lower extremity wounds due to trauma History of Present Illness (HPI) Admission 11/11/2021 [name] is a 76 year old male with a past medical history of polymyalgia rheumatica that presents to the clinic for a 28-monthhistory of  nonhealing wound to the left lower extremity. He states that on Halloween he hit an object and created a wound that has not healed. He states he has seen dermatology and his primary care physician for this issue. On one occasion he had an Haematologist. He has also used Xeroform. It does not sound like he uses any kind of wound dressing consistently. He currently reports chronic pain to the wound site. He denies systemic signs of infection. He denies purulent drainage. 2/16; patient presents for follow-up. He has been using Hydrofera Blue and gentamicin ointment to the wound bed without issues. He reports improvement in wound healing. He currently denies signs of infection. 2/23; patient presents for follow-up. He continues to use Hydrofera Blue and gentamicin ointment to the wound bed without issues. He denies signs of infection. He reports improvement in his chronic pain to the wound bed. 3/2; patient presents for follow-up. He has been using Hydrofera Blue and gentamicin to the wound bed without issues. He reports he is going to [place] for the next 3 weeks. He currently denies signs of infection. 3/24; patient presents for follow-up. He has been in [place] for the past 3 weeks for vacation. He enjoyed his trip. He reports no issues today. He has been using Hydrofera Blue and gentamicin ointment to the wound beds without issues. 4/7; patient presents for follow-up. He has been using Hydrofera Blue to the wound bed without issues. He reports that his wound is healed. 02/03/2022; patient was discharged 1 month ago with a closed wound to the left lower  extremity. Unfortunately over the past month he has developed 3 new wounds he states was caused by hitting his legs against different objects. He has been keeping the areas covered with DuoDERM. He reports mild tenderness to the right lower extremity wound bed with increased redness. He denies purulent drainage. He states he has his previous wound care supplies of Hydrofera Blue and gentamicin ointment. 5/11; patient presents for follow-up. He had a PCR culture done at last clinic visit that showed high levels of Staph aureus and viridans and low levels of coagulase-negative staph. I started him on Augmentin at last clinic visit but he reports continued pain and erythema to the wound sites. He has been using gentamicin ointment and Hydrofera Blue with dressing changes. He also presents with a 100.2 temp cough and diarrhea. He has not taken anything for his symptoms. 5/18; patient presents for follow-up. He received his Keystone antibiotics and started using this. Unfortunately he mixed it incorrectly and it turned into a hardened substance. He had to use his refill to start all over again. He has only been using this for 1 day. He states he is combining this correctly now. He completed his course of clindamycin. He denies signs of infection. 5/25; patient presents for follow-up. He has been using Keystone antibiotics with Sorbact. He reports more serous drainage from the left lower extremity. Other than that he has no issues or complaints. He denies signs of infection. 6/1; patient presents for follow-up. We switched to Iodoflex and Keystone antibiotic under compression therapy at last clinic visit. He tolerated this treatment well. He has no issues or complaints today. He denies signs of infection. Unfortunately he will be out of town for the next 3 weeks. 6/22; patient presents for follow-up. He was in [place] for the past 3 weeks. He has been using compression stockings daily along with [facility]  antibiotic and Hydrofera Blue. Unfortunately he developed a skin tear to  his right arm after hitting a door. 6/29; patient presents for follow-up. We have been using Keystone antibiotics with PolyMem silver to the lower extremities under 3 layer compression. Apparently he has a skin tear to the lateral Aspect of the right leg that he states was there previously. I had not seen it before. His skin tear on his right arm is well-healing with the use of bacitracin. He has no issues or complaints today. 7/6; patient presents for follow-up. We have been using Keystone antibiotics with PolyMem silver to the lower extremities under 3 layer compression. And antibiotic ointment to the right lateral leg wound. Patient reports improvement in wound healing. 7/13; patient presents for follow-up. We have been using Keystone antibiotics and PolyMem silver to all the wound beds. Patient denies signs of infection. He tolerated the compression wraps well. He is currently being treated for C. difficile with vancomycin. 7/20; patient presents for follow-up. We have been using Keystone antibiotic and PolyMem silver to all wound beds. He continues to do well with compression wraps bilaterally. He has no issues or complaints today. 7/27; patient presents for follow-up. We have been using Keystone antibiotic and PolyMem silver to the wound beds under 3 layer compression. The right medial wound has healed. He has 2 remaining wounds. He denies signs of infection. 8/3; patient presents for follow-up. We have been using collagen under 3 layer compression. He has no issues or complaints today. 8/10; patient presents for follow-up. We have been using collagen under 3 layer compression. He has no issues or complaints today. 8/24; patient presents for follow-up. We have been using collagen due to 3 layer compression to both lower extremities. Patient has no issues or complaints today. 8/29; patient presents for follow-up. We have  been doing Xeroform under compression therapy to the left leg. This wound is healed. We have been doing collagen to the right lower extremity wound also under compression therapy. Patient has his compression stockings today. 9/7; patient presents for follow-up. We have been doing Xeroform under 3 layer compression to the right lower extremity. He has been using his compression stocking to the left lower extremity. He has no issues or complaints today. Patient History Information obtained from Chart. Family History Cancer - Mother, No family history of Diabetes, Heart Disease, Hypertension, Kidney Disease, Lung Disease, Seizures, Stroke, Thyroid Problems, Tuberculosis. Social History Former smoker - quit 40 years ago, Marital Status - Married, Alcohol Use - Never, Drug Use - Current History - marijuana, Caffeine Use - Rarely. Medical History Eyes Denies history of Cataracts, Glaucoma, Optic Neuritis Ear/Nose/Mouth/Throat Denies history of Chronic sinus problems/congestion, Middle ear problems Hematologic/Lymphatic Denies history of Anemia, Hemophilia, Human Immunodeficiency Virus, Lymphedema, Sickle Cell Disease Respiratory Patient has history of Asthma Denies history of Aspiration, Chronic Obstructive Pulmonary Disease (COPD), Pneumothorax, Sleep Apnea, Tuberculosis Cardiovascular Patient has history of Hypertension Denies history of Angina, Arrhythmia, Congestive Heart Failure, Coronary Artery Disease, Deep Vein Thrombosis, Hypotension, Myocardial Infarction, Peripheral Arterial Disease, Peripheral Venous Disease, Phlebitis, Vasculitis Gastrointestinal Patient has history of Hepatitis A Denies history of Cirrhosis , Colitis, Crohnoos, Hepatitis B, Hepatitis C Endocrine Denies history of Type I Diabetes, Type II Diabetes Genitourinary Denies history of End Stage Renal Disease Immunological Denies history of Lupus Erythematosus, Raynaudoos, Scleroderma Integumentary (Skin) Denies  history of History of Burn Musculoskeletal Patient has history of Rheumatoid Arthritis Denies history of Gout, Osteoarthritis, Osteomyelitis Psychiatric Denies history of Anorexia/bulimia, Confinement Anxiety Hospitalization/Surgery History - 5 years ago Renal Cell carcinoma. 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 Vitals Time Taken: 9:09 AM, Height: 68 in, Weight: 140 lbs, BMI: 21.3, Temperature: 97.7 F, Pulse: 56 bpm, Respiratory Rate: 20 breaths/min, Blood Pressure: 147/79 mmHg. Cardiovascular 2+ dorsalis pedis/posterior tibialis pulses. Psychiatric pleasant and cooperative. General Notes: Right lower extremity: Epithelization to the previous wound site. Good edema control. No open wounds to legs bilaterally. Integumentary (Hair, Skin) Wound #4 status is Healed - Epithelialized. Original cause of wound was Trauma. The date acquired was: 01/04/2022. The wound has been in treatment 18 weeks. The wound is located on the Right,Anterior Lower Leg. The wound measures 0cm length x 0cm width x 0cm depth; 0cm^2 area and 0cm^3 volume. There is no tunneling or undermining noted. There is a none present amount of drainage noted. The wound margin is distinct with the outline attached to the wound base. There is no granulation within the wound bed. There is no necrotic tissue within the wound bed. 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 His wound has healed. I recommended compression stockings daily. He may follow-up as needed. Plan Discharge From [facility] Services: Discharge from [place] - Call if any future wound care needs. Wear your stockings daily. lotion every night before bed. Edema Control - Lymphedema / SCD / Other: Elevate legs to the level of the heart or above for 30 minutes daily and/or when sitting, a frequency of: - 3-4 times a day throughout the day. Avoid standing for long periods of time. Patient to wear own compression stockings every day. Compression stocking or Garment 20-30 mm/Hg pressure to: - both legs. 1. Discharge from clinic due to closed wound. 2. Follow-up as needed 3. Compression stockings daily. Electronic Signature(s) Signed: 06/09/2022 1:45:58 PM By: Kalman Shan DO Entered By: Kalman Shan on 06/09/2022 10:28:58 -------------------------------------------------------------------------------- HxROS Details Patient Name: Date of Service: Brian Leon, RO NN P. 06/09/2022 9:00 A M Medical Record Number: [id number] Patient Account Number: [id number] Date of Birth/Sex: Treating RN: 01/20/1946 (76 y.o. M) Primary Care Provider: [name] Other Clinician: Referring Provider: Treating Provider/Extender: [name] Weeks in Treatment: 30 Information Obtained From Chart Eyes Medical History: Negative for: Cataracts; Glaucoma; Optic Neuritis Past Medical History Notes: Blind right eye WEARS PROSTHESIS Ear/Nose/Mouth/Throat Medical History: Negative for: Chronic sinus problems/congestion; Middle  ear problems Past Medical History Notes: Allergic rhinitis Hematologic/Lymphatic Medical History: Negative for: Anemia; Hemophilia; Human Immunodeficiency Virus; Lymphedema; Sickle Cell Disease Respiratory Medical History: Positive for: Asthma Negative for: Aspiration; Chronic Obstructive Pulmonary Disease (COPD); Pneumothorax; Sleep Apnea; Tuberculosis Past Medical History Notes: Pneumonia Cardiovascular Medical History: Positive for: Hypertension Negative for: Angina; Arrhythmia; Congestive Heart Failure; Coronary Artery Disease; Deep Vein Thrombosis; Hypotension; Myocardial Infarction; Peripheral Arterial Disease; Peripheral Venous Disease; Phlebitis; Vasculitis Past Medical History Notes: Thoracic aortic aneurysm Thrombocytosis Gastrointestinal Medical History: Positive for: Hepatitis A Negative for: Cirrhosis ; Colitis;  Crohns; Hepatitis B; Hepatitis C Endocrine Medical History: Negative for: Type I Diabetes; Type II Diabetes Genitourinary Medical History: Negative for: End Stage Renal Disease Past Medical History Notes: 1/3kidney missing per patient-Renal Cell carcinoma Renal Cyst 5 years ago Immunological Medical History: Negative for: Lupus Erythematosus; Raynauds; Scleroderma Integumentary (Skin) Medical History: Negative for: History of Burn Musculoskeletal Medical History: Positive for: Rheumatoid Arthritis Negative for: Gout; Osteoarthritis; Osteomyelitis Past Medical History Notes: 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 Eczema Neurologic Medical History: Past Medical History Notes: Paraureteric diverticulum Oncologic Medical History: Past Medical History Notes: Renal Cell carcinoma- 5 years ago Psychiatric Medical History: Negative for: Anorexia/bulimia; Confinement Anxiety Immunizations Pneumococcal Vaccine: Received Pneumococcal Vaccination: Yes Received Pneumococcal Vaccination On or After  60th Birthday: Yes Implantable Devices No devices added Hospitalization / Surgery History Type of Hospitalization/Surgery 5 years ago Renal Cell carcinoma Family and Social History Cancer: Yes - Mother; Diabetes: No; Heart Disease: No; Hypertension: No; Kidney Disease: No; Lung Disease: No; Seizures: No; Stroke: No; Thyroid Problems: No; Tuberculosis: No; Former smoker - quit 40 years ago; Marital Status - Married; Alcohol Use: Never; Drug Use: Current History - marijuana; Caffeine Use: Rarely; Financial Concerns: No; Food, Clothing or Shelter Needs: No; Support System Lacking: No; Transportation Concerns: No Electronic Signature(s) Signed: 06/09/2022 1:45:58 PM By: Kalman Shan DO Entered By: Kalman Shan on 06/09/2022 10:27:32 -------------------------------------------------------------------------------- SuperBill Details Patient Name: Date of Service: Brian Leon, RO NN P. 06/09/2022 Medical Record Number: [id number] Patient Account Number: [id number] Date of Birth/Sex: Treating RN: 1946/06/09 (76 y.o. 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) Facility Procedures CPT4 Code: [id number] Description: 99213 - WOUND CARE VISIT-LEV 3 EST PT Modifier: Quantity: 1 Physician Procedures : CPT4 Code Description Modifier [id number] - WC PHYS LEVEL 3 - EST PT ICD-10 Diagnosis Description L97.822 Non-pressure chronic ulcer of other  part of left lower leg with fat layer exposed Q94.765 Non-pressure chronic ulcer of unspecified part of  right lower leg with fat layer exposed 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

## 2022-06-09 NOTE — Progress Notes (Signed)
[name], [name] ([id number]) Visit Report for 06/09/2022 Arrival Information Details Patient Name: Date of Service: Brian Leon, [name] 06/09/2022 9:00 A M Medical Record Number: [id number] Patient Account Number: [id number] Date of Birth/Sex: Treating RN: [birth year] (76 y.o. Brian Leon Primary Care Brian Leon: Brian Leon Other Clinician: Referring Brian Leon: Treating Brian Leon/Extender: Brian Leon Weeks in Treatment: 65 Visit Information History Since Last Visit Added or deleted any medications: No Patient Arrived: Ambulatory Any new allergies or adverse reactions: No Arrival Time: 09:09 Had a fall or experienced change in No Accompanied By: self activities of daily living that may affect Transfer Assistance: None risk of falls: Patient Identification Verified: Yes Signs or symptoms of abuse/neglect since last visito No Secondary Verification Process Completed: Yes Hospitalized since last visit: No Patient Requires Transmission-Based Precautions: No Implantable device outside of the clinic excluding No Patient Has Alerts: Yes cellular tissue based products placed in the center Patient Alerts: 11/02/21 L ABI: 1.37 since last visit: 11/02/2021 L TBI 0.77 Has Dressing in Place as Prescribed: Yes 11/02/2021 R TBI 0.69 Has Compression in Place as Prescribed: Yes 11/02/2021 R ABI 1.3 Pain Present Now: No Electronic Signature(s) Signed: 06/09/2022 4:40:53 PM By: Brian Pilling RN, BSN Entered By: Brian Leon on 06/09/2022 09:10:11 -------------------------------------------------------------------------------- Clinic Level of Care Assessment Details Patient Name: Date of Service: Brian Leon, RO NN P. 06/09/2022 9:00 A M Medical Record Number: [id number] Patient Account Number: [id number] Date of Birth/Sex: Treating RN: Brian Leon Weeks in Treatment: 30 Clinic Level of Care Assessment Items TOOL 4 Quantity Score X- 1 0 Use when only an EandM is performed on FOLLOW-UP visit ASSESSMENTS - Nursing Assessment / Reassessment X- 1 10 Reassessment of Co-morbidities (includes updates in patient status) X- 1 5 Reassessment of Adherence to Treatment Plan ASSESSMENTS - Wound and Skin A ssessment / Reassessment X - Simple Wound Assessment / Reassessment - one wound 1 5 '[]'$  - 0 Complex Wound Assessment / Reassessment - multiple wounds '[]'$  - 0 Dermatologic / Skin Assessment (not related to wound area) ASSESSMENTS - Focused Assessment X- 1 5 Circumferential Edema Measurements - multi extremities '[]'$  - 0 Nutritional Assessment / Counseling / Intervention '[]'$  - 0 Lower Extremity Assessment (monofilament, tuning fork, pulses) '[]'$  - 0 Peripheral Arterial Disease Assessment (using hand held doppler) ASSESSMENTS - Ostomy and/or Continence Assessment and Care '[]'$  - 0 Incontinence Assessment and Management '[]'$  - 0 Ostomy Care Assessment and Management (repouching, etc.) PROCESS - Coordination of Care X - Simple Patient / Family Education for ongoing care 1 15 '[]'$  - 0 Complex (extensive) Patient / Family Education for ongoing care X- 1 10 Staff obtains Programmer, systems, Records, T Results / Process Orders est '[]'$  - 0 Staff telephones HHA, Nursing Homes / Clarify orders / etc '[]'$  - 0 Routine Transfer to another Facility (non-emergent condition) '[]'$  - 0 Routine Hospital Admission (non-emergent condition) '[]'$  - 0 New Admissions / Biomedical engineer / Ordering NPWT Apligraf, etc. , '[]'$  - 0 Emergency Hospital Admission (emergent condition) X- 1 10 Simple Discharge Coordination '[]'$  - 0 Complex (extensive) Discharge Coordination PROCESS - Special Needs '[]'$  - 0 Pediatric / Minor Patient Management '[]'$  - 0 Isolation Patient Management '[]'$  - 0 Hearing / Language / Visual special needs '[]'$  - 0 Assessment of  Community assistance (transportation, D/C planning, etc.) '[]'$  - 0 Additional assistance / Altered mentation '[]'$  -  0 Support Surface(s) Assessment (bed, cushion, seat, etc.) INTERVENTIONS - Wound Cleansing / Measurement X - Simple Wound Cleansing - one wound 1 5 '[]'$  - 0 Complex Wound Cleansing - multiple wounds X- 1 5 Wound Imaging (photographs - any number of wounds) '[]'$  - 0 Wound Tracing (instead of photographs) X- 1 5 Simple Wound Measurement - one wound '[]'$  - 0 Complex Wound Measurement - multiple wounds INTERVENTIONS - Wound Dressings X - Small Wound Dressing one or multiple wounds 1 10 '[]'$  - 0 Medium Wound Dressing one or multiple wounds '[]'$  - 0 Large Wound Dressing one or multiple wounds '[]'$  - 0 Application of Medications - topical '[]'$  - 0 Application of Medications - injection INTERVENTIONS - Miscellaneous '[]'$  - 0 External ear exam '[]'$  - 0 Specimen Collection (cultures, biopsies, blood, body fluids, etc.) '[]'$  - 0 Specimen(s) / Culture(s) sent or taken to Lab for analysis '[]'$  - 0 Patient Transfer (multiple staff / Civil Service fast streamer / Similar devices) '[]'$  - 0 Simple Staple / Suture removal (25 or less) '[]'$  - 0 Complex Staple / Suture removal (26 or more) '[]'$  - 0 Hypo / Hyperglycemic Management (close monitor of Blood Glucose) '[]'$  - 0 Ankle / Brachial Index (ABI) - do not check if billed separately X- 1 5 Vital Signs Has the patient been seen at the hospital within the last three years: Yes Total Score: 90 Level Of Care: New/Established - Level 3 Electronic Signature(s) Signed: 06/09/2022 4:40:53 PM By: Brian Pilling RN, BSN Entered By: Brian Leon on 06/09/2022 09:18:05 -------------------------------------------------------------------------------- Encounter Discharge Information Details Patient Name: Date of Service: Brian Leon, RO NN P. 06/09/2022 9:00 A M Medical Record Number: [id number] Patient Account Number: [id number] Date of Birth/Sex: Treating RN: [birth year] (76 y.o. Brian Leon Primary Care Brian Leon: Brian Leon Other Clinician: Referring Brian Cacciola: Treating Lj Miyamoto/Extender: Brian Leon Weeks in Treatment: 30 Encounter Discharge Information Items Discharge Condition: Stable Ambulatory Status: Ambulatory Discharge Destination: Home Transportation: Private Auto Accompanied By: self Schedule Follow-up Appointment: No Clinical Summary of Care: Electronic Signature(s) Signed: 06/09/2022 4:40:53 PM By: Brian Pilling RN, BSN Entered By: Brian Leon on 06/09/2022 09:18:36 -------------------------------------------------------------------------------- Lower Extremity Assessment Details Patient Name: Date of Service: Brian Leon, RO NN P. 06/09/2022 9:00 A M Medical Record Number: [id number] Patient Account Number: [id number] Date of Birth/Sex: Treating RN: [birth year] (76 y.o. Brian Leon Primary Care Bentlie Catanzaro: Brian Leon Other Clinician: Referring Shalev Helminiak: Treating Caitlen Worth/Extender: Brian Leon Weeks in Treatment: 30 Edema Assessment Assessed: [Left: No] [Right: Yes] Edema: [Left: No] [Right: No] Calf Left: Right: Point of Measurement: 37 cm From Medial Instep 25 cm Ankle Left: Right: Point of Measurement: 12 cm From Medial Instep 18 cm Vascular Assessment Pulses: Dorsalis Pedis Palpable: [Right:Yes] Electronic Signature(s) Signed: 06/09/2022 4:40:53 PM By: Brian Pilling RN, BSN Entered By: Brian Leon on 06/09/2022 09:14:15 -------------------------------------------------------------------------------- Multi Wound Chart Details Patient Name: Date of Service: Brian Leon, RO NN P. 06/09/2022 9:00 A M Medical Record Number: [id number] Patient Account Number: [id number] Date of Birth/Sex: Treating RN: Leon[birth year] (76 y.o. M) Primary Care Wilian Kwong: Brian Leon Other Clinician: Referring Jorie Zee: Treating Kacia Halley/Extender: Bebe Shaggy, JA CKY Weeks in Treatment: 30 Vital  Signs Height(in): 68 Pulse(bpm): 59 Weight(lbs): 140 Blood Pressure(mmHg): 147/79 Body Mass Index(BMI): 21.3 Temperature(F): 97.7 Respiratory Rate(breaths/min): 20 Photos: [N/A:N/A] Right, Anterior Lower Leg N/A N/A Wound Location: Trauma N/A N/A Wounding Event: Abrasion N/A N/A Primary Etiology: Asthma, Hypertension, Hepatitis A, N/A N/A Comorbid History: Rheumatoid  Arthritis 01/04/2022 N/A N/A Date Acquired: 79 N/A N/A Weeks of Treatment: Healed - Epithelialized N/A N/A Wound Status: No N/A N/A Wound Recurrence: 0x0x0 N/A N/A Measurements L x W x D (cm) 0 N/A N/A A (cm) : rea 0 N/A N/A Volume (cm) : 100.00% N/A N/A % Reduction in Area: 100.00% N/A N/A % Reduction in Volume: Full Thickness Without Exposed N/A N/A Classification: Support Structures None Present N/A N/A Exudate Amount: Distinct, outline attached N/A N/A Wound Margin: None Present (0%) N/A N/A Granulation Amount: None Present (0%) N/A N/A Necrotic Amount: Fascia: No N/A N/A Exposed Structures: Fat Layer (Subcutaneous Tissue): No Tendon: No Muscle: No Joint: No Bone: No Large (67-100%) N/A N/A Epithelialization: Treatment Notes Electronic Signature(s) Signed: 06/09/2022 1:45:58 PM By: Kalman Shan DO Entered By: Kalman Shan on 06/09/2022 10:26:42 -------------------------------------------------------------------------------- Multi-Disciplinary Care Plan Details Patient Name: Date of Service: Brian Leon, RO NN P. 06/09/2022 9:00 A M Medical Record Number: [id number] Patient Account Number: [id number] Date of Birth/Sex: Treating RN: [birth year] (76 y.o. Brian Leon Primary Care Yen Wandell: Brian Leon Other Clinician: Referring Chrisy Hillebrand: Treating Joachim Carton/Extender: Brian Leon Weeks in Treatment: 30 Active Inactive Electronic Signature(s) Signed: 06/09/2022 4:40:53 PM By: Brian Pilling RN, BSN Entered By: Brian Leon on 06/09/2022  09:16:21 -------------------------------------------------------------------------------- Pain Assessment Details Patient Name: Date of Service: Brian Leon, RO NN P. 06/09/2022 9:00 A M Medical Record Number: [id number] Patient Account Number: [id number] Date of Birth/Sex: Treating RN: [birth year] (76 y.o. Brian Leon Primary Care Kentley Cedillo: Brian Leon Other Clinician: Referring Kizzi Overbey: Treating Traniyah Hallett/Extender: Brian Leon Weeks in Treatment: 30 Active Problems Location of Pain Severity and Description of Pain Patient Has Paino No Site Locations Rate the pain. Current Pain Level: 0 Pain Management and Medication Current Pain Management: Medication: No Cold Application: No Rest: No Massage: No Activity: No T.E.N.S.: No Heat Application: No Leg drop or elevation: No Is the Current Pain Management Adequate: Adequate How does your wound impact your activities of daily livingo Sleep: No Bathing: No Appetite: No Relationship With Others: No Bladder Continence: No Emotions: No Bowel Continence: No Work: No Toileting: No Drive: No Dressing: No Hobbies: No Engineer, maintenance) Signed: 06/09/2022 4:40:53 PM By: Brian Pilling RN, BSN Entered By: Brian Leon on 06/09/2022 09:10:30 -------------------------------------------------------------------------------- Patient/Caregiver Education Details Patient Name: Date of Service: Brian Leon, RO NN P. 9/7/2023andnbsp9:00 A M Medical Record Number: [id number] Patient Account Number: [id number] Date of Birth/Gender: Treating RN: [birth year] (76 y.o. Brian Leon Primary Care Physician: Brian Leon Other Clinician: Referring Physician: Treating Physician/Extender: Brian Leon Weeks in Treatment: 30 Education Assessment Education Provided To: Patient Education Topics Provided Venous: Handouts: Controlling Swelling with Compression Stockings Methods:  Explain/Verbal Responses: State content correctly Electronic Signature(s) Signed: 06/09/2022 4:40:53 PM By: Brian Pilling RN, BSN Entered By: Brian Leon on 06/09/2022 09:16:40 -------------------------------------------------------------------------------- Wound Assessment Details Patient Name: Date of Service: Brian Leon, RO NN P. 06/09/2022 9:00 A M Medical Record Number: [id number] Patient Account Number: [id number] Date of Birth/Sex: Treating RN: [birth year] (76 y.o. Brian Leon Primary Care Nubia Ziesmer: Brian Leon Other Clinician: Referring Emmalene Kattner: Treating Andree Golphin/Extender: Brian Leon Weeks in Treatment: 30 Wound Status Wound Number: 4 Primary Etiology: Abrasion Wound Location: Right, Anterior Lower Leg Wound Status: Healed - Epithelialized Wounding Event: Trauma Comorbid History: Asthma, Hypertension, Hepatitis A, Rheumatoid Arthritis Date Acquired: 01/04/2022 Weeks Of Treatment: 18 Clustered Wound: No Photos Wound Measurements Length: (cm) Width: (cm) Depth: (  cm) Area: (cm) Volume: (cm) 0 % Reduction in Area: 100% 0 % Reduction in Volume: 100% 0 Epithelialization: Large (67-100%) 0 Tunneling: No 0 Undermining: No Wound Description Classification: Full Thickness Without Exposed Support Structures Wound Margin: Distinct, outline attached Exudate Amount: None Present Foul Odor After Cleansing: No Slough/Fibrino No Wound Bed Granulation Amount: None Present (0%) Exposed Structure Necrotic Amount: None Present (0%) Fascia Exposed: No Fat Layer (Subcutaneous Tissue) Exposed: No Tendon Exposed: No Muscle Exposed: No Joint Exposed: No Bone Exposed: No Electronic Signature(s) Signed: 06/09/2022 4:40:53 PM By: Brian Pilling RN, BSN Entered By: Brian Leon on 06/09/2022 09:16:05 -------------------------------------------------------------------------------- Vitals Details Patient Name: Date of Service: Brian Leon, RO NN P. 06/09/2022  9:00 A M Medical Record Number: [id number] Patient Account Number: [id number] Date of Birth/Sex: Treating RN: [birth year] (76 y.o. Brian Leon Primary Care Callaghan Laverdure: Brian Leon Other Clinician: Referring Jaylon Boylen: Treating Emelly Wurtz/Extender: Brian Leon Weeks in Treatment: 30 Vital Signs Time Taken: 09:09 Temperature (F): 97.7 Height (in): 68 Pulse (bpm): 56 Weight (lbs): 140 Respiratory Rate (breaths/min): 20 Body Mass Index (BMI): 21.3 Blood Pressure (mmHg): 147/79 Reference Range: 80 - 120 mg / dl Electronic Signature(s) Signed: 06/09/2022 4:40:53 PM By: Brian Pilling RN, BSN Entered By: Brian Leon on 06/09/2022 09:10:22

## 2022-06-16 ENCOUNTER — Encounter (HOSPITAL_BASED_OUTPATIENT_CLINIC_OR_DEPARTMENT_OTHER): Payer: Medicare (Managed Care) | Admitting: Internal Medicine

## 2022-06-22 ENCOUNTER — Other Ambulatory Visit: Payer: Self-pay | Admitting: Family Medicine

## 2022-06-22 DIAGNOSIS — R058 Other specified cough: Secondary | ICD-10-CM

## 2022-06-22 DIAGNOSIS — J301 Allergic rhinitis due to pollen: Secondary | ICD-10-CM

## 2022-07-24 ENCOUNTER — Other Ambulatory Visit: Payer: Self-pay | Admitting: Family Medicine

## 2022-07-24 DIAGNOSIS — R058 Other specified cough: Secondary | ICD-10-CM

## 2022-09-06 ENCOUNTER — Other Ambulatory Visit (HOSPITAL_COMMUNITY): Payer: Self-pay

## 2022-09-07 ENCOUNTER — Other Ambulatory Visit: Payer: Self-pay | Admitting: Rheumatology

## 2022-09-07 MED ORDER — PREDNISONE 1 MG PO TABS: 3.0000 mg | ORAL_TABLET | Freq: Every day | ORAL | 0 refills | Status: DC

## 2022-09-07 NOTE — Telephone Encounter (Signed)
Next Visit: 11/01/2022  Last Visit: 04/28/2022  Last Fill: 05/24/2022  DX: Polymyalgia rheumatica   Current Dose per office note on 04/28/2022: He is taking prednisone 3 mg daily and is unable to taper.   Okay to refill prednisone?

## 2022-09-07 NOTE — Telephone Encounter (Signed)
Patient called the office requesting a refill of Prednisone '1mg'$  to be sent to [facility] on [address].

## 2022-09-21 ENCOUNTER — Other Ambulatory Visit: Payer: Self-pay | Admitting: Cardiology

## 2022-09-22 ENCOUNTER — Ambulatory Visit (HOSPITAL_COMMUNITY)
Admission: RE | Admit: 2022-09-22 | Discharge: 2022-09-22 | Disposition: A | Discharge status: Home / Self Care | Payer: Medicare (Managed Care) | Source: Ambulatory Visit | Attending: Family Medicine | Admitting: Family Medicine

## 2022-09-22 ENCOUNTER — Ambulatory Visit (INDEPENDENT_AMBULATORY_CARE_PROVIDER_SITE_OTHER): Payer: Medicare (Managed Care) | Admitting: Student

## 2022-09-22 ENCOUNTER — Encounter: Payer: Self-pay | Admitting: Student

## 2022-09-22 ENCOUNTER — Other Ambulatory Visit: Payer: Self-pay | Admitting: Student

## 2022-09-22 VITALS — BP 120/71 | HR 72 | Ht 69.0 in | Wt 137.2 lb

## 2022-09-22 DIAGNOSIS — M160 Bilateral primary osteoarthritis of hip: Secondary | ICD-10-CM | POA: Insufficient documentation

## 2022-09-22 DIAGNOSIS — M47816 Spondylosis without myelopathy or radiculopathy, lumbar region: Secondary | ICD-10-CM | POA: Insufficient documentation

## 2022-09-22 DIAGNOSIS — S72091A Other fracture of head and neck of right femur, initial encounter for closed fracture: Secondary | ICD-10-CM | POA: Insufficient documentation

## 2022-09-22 DIAGNOSIS — W108XXA Fall (on) (from) other stairs and steps, initial encounter: Secondary | ICD-10-CM | POA: Insufficient documentation

## 2022-09-22 DIAGNOSIS — I708 Atherosclerosis of other arteries: Secondary | ICD-10-CM | POA: Insufficient documentation

## 2022-09-22 DIAGNOSIS — M80051A Age-related osteoporosis with current pathological fracture, right femur, initial encounter for fracture: Secondary | ICD-10-CM | POA: Diagnosis not present

## 2022-09-22 DIAGNOSIS — S7291XA Unspecified fracture of right femur, initial encounter for closed fracture: Secondary | ICD-10-CM | POA: Diagnosis not present

## 2022-09-22 DIAGNOSIS — M25551 Pain in right hip: Secondary | ICD-10-CM | POA: Diagnosis not present

## 2022-09-22 NOTE — Patient Instructions (Signed)
It was great to see you! Thank you for allowing me to participate in your care!   Our plans for today:  - We are going to get a hip xray to make sure you do no have a fracture-if you do you will have to go to the ED for evaluation - Alternate tylenol and ibuprofen every 3 hours for pain  Take care and seek immediate care sooner if you develop any concerns.  Gerrit Heck, MD

## 2022-09-22 NOTE — Progress Notes (Signed)
    SUBJECTIVE:   CHIEF COMPLAINT / HPI: Fall  Came off stairs outside yesterday and fell. Half missed the last step on concrete. Says he mostly rolled onto his right side. Complaining of hip pain currently.  He denies any trauma to his head. Not on any blood thinners.  He said the fall was from missing the stair while going down and that is was getting dark at that time. He denies any dizziness or palpations before event. He has a history of osteoarthritis and is does get Prolia.  Last bone density DEXA was in 2022 which showed T-score of -2.8 considered osteoporotic.  He has had a history of syncope and collapse-seen by cardiology 04/2022 and an EKG at that time showed sinus rhythm with right bundle branch block wearing a Zio patch but this felt different and he did not have pre-syncopal symptoms.  When standing up it hurts in the front of his thigh. Using two canes to get a around.  PERTINENT  PMH / PSH: age related osteoporosis, PMR on steroids  OBJECTIVE:   BP 120/71   Pulse 72   Ht '5\' 9"'$  (1.753 m)   Wt 137 lb 4 oz (62.3 kg)   SpO2 100%   BMI 20.27 kg/m   Hip:  - Inspection: No gross deformity, no swelling, erythema, or ecchymosis b/l - Palpation: TTP over greater trochanter on right side - ROM: Decreased range of motion on internal and external rotation which elicits pain as well as extension. Normal Flexion b/l - Strength: Decreased strength on right side with hip flexion, normal otherwise - Neuro/vasc: NV intact distally b/l  ASSESSMENT/PLAN:   Fall (on) (from) other stairs and steps, initial encounter Patient complaining of hip pain s/p a mechanical fall yesterday night. Does have pain on examination and is using 2 canes to walk (baseline of nothing) -Hip XR for evaluation of possible fracture -Will call patient and may need to go to ED if fractured   Gerrit Heck, MD [place]

## 2022-09-23 ENCOUNTER — Other Ambulatory Visit: Payer: Self-pay | Admitting: Family Medicine

## 2022-09-23 ENCOUNTER — Inpatient Hospital Stay (HOSPITAL_COMMUNITY)
Admission: EM | Admit: 2022-09-23 | Discharge: 2022-09-25 | DRG: 482 | Disposition: A | Discharge status: Home Health | Payer: Medicare (Managed Care) | Attending: Family Medicine | Admitting: Family Medicine

## 2022-09-23 ENCOUNTER — Encounter (HOSPITAL_COMMUNITY): Payer: Self-pay

## 2022-09-23 ENCOUNTER — Other Ambulatory Visit: Payer: Self-pay

## 2022-09-23 ENCOUNTER — Telehealth: Payer: Self-pay

## 2022-09-23 DIAGNOSIS — Z886 Allergy status to analgesic agent status: Secondary | ICD-10-CM

## 2022-09-23 DIAGNOSIS — Z7951 Long term (current) use of inhaled steroids: Secondary | ICD-10-CM

## 2022-09-23 DIAGNOSIS — Z823 Family history of stroke: Secondary | ICD-10-CM

## 2022-09-23 DIAGNOSIS — K219 Gastro-esophageal reflux disease without esophagitis: Secondary | ICD-10-CM | POA: Diagnosis present

## 2022-09-23 DIAGNOSIS — L309 Dermatitis, unspecified: Secondary | ICD-10-CM | POA: Diagnosis present

## 2022-09-23 DIAGNOSIS — K5900 Constipation, unspecified: Secondary | ICD-10-CM | POA: Diagnosis not present

## 2022-09-23 DIAGNOSIS — Z7952 Long term (current) use of systemic steroids: Secondary | ICD-10-CM

## 2022-09-23 DIAGNOSIS — Z970 Presence of artificial eye: Secondary | ICD-10-CM

## 2022-09-23 DIAGNOSIS — N400 Enlarged prostate without lower urinary tract symptoms: Secondary | ICD-10-CM | POA: Diagnosis present

## 2022-09-23 DIAGNOSIS — Z7983 Long term (current) use of bisphosphonates: Secondary | ICD-10-CM

## 2022-09-23 DIAGNOSIS — M1990 Unspecified osteoarthritis, unspecified site: Secondary | ICD-10-CM | POA: Diagnosis present

## 2022-09-23 DIAGNOSIS — S72001A Fracture of unspecified part of neck of right femur, initial encounter for closed fracture: Principal | ICD-10-CM

## 2022-09-23 DIAGNOSIS — W108XXA Fall (on) (from) other stairs and steps, initial encounter: Secondary | ICD-10-CM | POA: Insufficient documentation

## 2022-09-23 DIAGNOSIS — J45909 Unspecified asthma, uncomplicated: Secondary | ICD-10-CM | POA: Diagnosis present

## 2022-09-23 DIAGNOSIS — I10 Essential (primary) hypertension: Secondary | ICD-10-CM | POA: Diagnosis present

## 2022-09-23 DIAGNOSIS — Z981 Arthrodesis status: Secondary | ICD-10-CM

## 2022-09-23 DIAGNOSIS — Z79899 Other long term (current) drug therapy: Secondary | ICD-10-CM

## 2022-09-23 DIAGNOSIS — I712 Thoracic aortic aneurysm, without rupture, unspecified: Secondary | ICD-10-CM | POA: Diagnosis present

## 2022-09-23 DIAGNOSIS — M80051A Age-related osteoporosis with current pathological fracture, right femur, initial encounter for fracture: Principal | ICD-10-CM | POA: Diagnosis present

## 2022-09-23 DIAGNOSIS — Z8701 Personal history of pneumonia (recurrent): Secondary | ICD-10-CM

## 2022-09-23 DIAGNOSIS — Z87442 Personal history of urinary calculi: Secondary | ICD-10-CM

## 2022-09-23 DIAGNOSIS — E7800 Pure hypercholesterolemia, unspecified: Secondary | ICD-10-CM | POA: Diagnosis present

## 2022-09-23 DIAGNOSIS — Z882 Allergy status to sulfonamides status: Secondary | ICD-10-CM

## 2022-09-23 DIAGNOSIS — M353 Polymyalgia rheumatica: Secondary | ICD-10-CM | POA: Diagnosis present

## 2022-09-23 DIAGNOSIS — Z905 Acquired absence of kidney: Secondary | ICD-10-CM

## 2022-09-23 DIAGNOSIS — S7291XA Unspecified fracture of right femur, initial encounter for closed fracture: Secondary | ICD-10-CM | POA: Diagnosis present

## 2022-09-23 DIAGNOSIS — Y92009 Unspecified place in unspecified non-institutional (private) residence as the place of occurrence of the external cause: Secondary | ICD-10-CM

## 2022-09-23 DIAGNOSIS — H5461 Unqualified visual loss, right eye, normal vision left eye: Secondary | ICD-10-CM | POA: Diagnosis present

## 2022-09-23 DIAGNOSIS — W010XXA Fall on same level from slipping, tripping and stumbling without subsequent striking against object, initial encounter: Secondary | ICD-10-CM | POA: Diagnosis present

## 2022-09-23 LAB — TYPE AND SCREEN
ABO/RH(D): O POS
Antibody Screen: NEGATIVE

## 2022-09-23 LAB — CBC WITH DIFFERENTIAL/PLATELET
Abs Immature Granulocytes: 0.12 10*3/uL — ABNORMAL HIGH (ref 0.00–0.07)
Basophils Absolute: 0.1 10*3/uL (ref 0.0–0.1)
Basophils Relative: 0 %
Eosinophils Absolute: 0.4 10*3/uL (ref 0.0–0.5)
Eosinophils Relative: 3 %
HCT: 35.9 % — ABNORMAL LOW (ref 39.0–52.0)
Hemoglobin: 11.6 g/dL — ABNORMAL LOW (ref 13.0–17.0)
Immature Granulocytes: 1 %
Lymphocytes Relative: 11 %
Lymphs Abs: 1.5 10*3/uL (ref 0.7–4.0)
MCH: 32.5 pg (ref 26.0–34.0)
MCHC: 32.3 g/dL (ref 30.0–36.0)
MCV: 100.6 fL — ABNORMAL HIGH (ref 80.0–100.0)
Monocytes Absolute: 1 10*3/uL (ref 0.1–1.0)
Monocytes Relative: 7 %
Neutro Abs: 10.9 10*3/uL — ABNORMAL HIGH (ref 1.7–7.7)
Neutrophils Relative %: 78 %
Platelets: 365 10*3/uL (ref 150–400)
RBC: 3.57 MIL/uL — ABNORMAL LOW (ref 4.22–5.81)
RDW: 13.7 % (ref 11.5–15.5)
WBC: 14 10*3/uL — ABNORMAL HIGH (ref 4.0–10.5)
nRBC: 0 % (ref 0.0–0.2)

## 2022-09-23 LAB — BASIC METABOLIC PANEL
Anion gap: 8 (ref 5–15)
BUN: 12 mg/dL (ref 8–23)
CO2: 20 mmol/L — ABNORMAL LOW (ref 22–32)
Calcium: 8.7 mg/dL — ABNORMAL LOW (ref 8.9–10.3)
Chloride: 109 mmol/L (ref 98–111)
Creatinine, Ser: 0.92 mg/dL (ref 0.61–1.24)
GFR, Estimated: >60 mL/min (ref >60)
Glucose, Bld: 92 mg/dL (ref 70–99)
Potassium: 4.2 mmol/L (ref 3.5–5.1)
Sodium: 137 mmol/L (ref 135–145)

## 2022-09-23 LAB — PROTIME-INR
INR: 1 (ref 0.8–1.2)
Prothrombin Time: 13.1 seconds (ref 11.4–15.2)

## 2022-09-23 NOTE — ED Triage Notes (Signed)
Pt states he fell 2 days ago and had an x-ray yesterday. Pt was called at 1700 today and told to come to ED for R femur fx.  Pt has been ambulatory with canes since accident. Pt sent for ortho consult.

## 2022-09-23 NOTE — Telephone Encounter (Signed)
Called patient and let him know that his xray showed a femur fracture and that he needs to go to the ED for evaluation. Patient amenable and on his way to [facility].

## 2022-09-23 NOTE — Telephone Encounter (Signed)
Patient calls nurse line requesting xray results.   Advised patient at this time the results are not back yet.   Patient would like a call over the Holiday if possible.   He gives permission to LVM if he does not answer.   Will forward to provider who saw patient.

## 2022-09-23 NOTE — Assessment & Plan Note (Signed)
Patient complaining of hip pain s/p a mechanical fall yesterday night. Does have pain on examination and is using 2 canes to walk (baseline of nothing) -Hip XR for evaluation of possible fracture -Will call patient and may need to go to ED if fractured

## 2022-09-23 NOTE — ED Provider Triage Note (Signed)
Emergency Medicine Provider Triage Evaluation Note  Brian Leon , a 76 y.o. male  was evaluated in triage.  Pt fell 2 days ago and had an x-ray done yesterday which showed a right femur fracture.  Pt was told to come to ED for ortho evaluation/consult.  He has been ambulatory with canes since the incident.   Review of Systems  Positive: As above Negative: As above  Physical Exam  BP 131/78 (BP Location: Right Arm)   Pulse 74   Temp 98.8 F (37.1 C) (Oral)   Resp 16   Ht '5\' 9"'$  (1.753 m)   Wt 61.2 kg   SpO2 97%   BMI 19.94 kg/m  Gen:   Awake, no distress   Resp:  Normal effort  MSK:   Moves extremities without difficulty, R hip TTP  Other:    Medical Decision Making  Medically screening exam initiated at 9:31 PM.  Appropriate orders placed.  Brian Leon was informed that the remainder of the evaluation will be completed by another provider, this initial triage assessment does not replace that evaluation, and the importance of remaining in the ED until their evaluation is complete.     Theressa Stamps R, [place] 09/23/22 2134

## 2022-09-24 ENCOUNTER — Observation Stay (HOSPITAL_COMMUNITY): Payer: Medicare (Managed Care) | Admitting: Anesthesiology

## 2022-09-24 ENCOUNTER — Encounter (HOSPITAL_COMMUNITY): Payer: Self-pay | Admitting: Family Medicine

## 2022-09-24 ENCOUNTER — Other Ambulatory Visit: Payer: Self-pay

## 2022-09-24 ENCOUNTER — Encounter (HOSPITAL_COMMUNITY)
Admission: EM | Disposition: A | Discharge status: Home / Self Care | Payer: Self-pay | Source: Home / Self Care | Attending: Family Medicine

## 2022-09-24 ENCOUNTER — Observation Stay (HOSPITAL_COMMUNITY): Payer: Medicare (Managed Care)

## 2022-09-24 DIAGNOSIS — Z970 Presence of artificial eye: Secondary | ICD-10-CM | POA: Diagnosis not present

## 2022-09-24 DIAGNOSIS — S7291XA Unspecified fracture of right femur, initial encounter for closed fracture: Secondary | ICD-10-CM | POA: Diagnosis present

## 2022-09-24 DIAGNOSIS — W010XXA Fall on same level from slipping, tripping and stumbling without subsequent striking against object, initial encounter: Secondary | ICD-10-CM | POA: Diagnosis present

## 2022-09-24 DIAGNOSIS — Z7952 Long term (current) use of systemic steroids: Secondary | ICD-10-CM | POA: Diagnosis not present

## 2022-09-24 DIAGNOSIS — I10 Essential (primary) hypertension: Secondary | ICD-10-CM | POA: Diagnosis present

## 2022-09-24 DIAGNOSIS — K5900 Constipation, unspecified: Secondary | ICD-10-CM | POA: Diagnosis not present

## 2022-09-24 DIAGNOSIS — Z882 Allergy status to sulfonamides status: Secondary | ICD-10-CM | POA: Diagnosis not present

## 2022-09-24 DIAGNOSIS — S72044A Nondisplaced fracture of base of neck of right femur, initial encounter for closed fracture: Secondary | ICD-10-CM | POA: Diagnosis not present

## 2022-09-24 DIAGNOSIS — E7800 Pure hypercholesterolemia, unspecified: Secondary | ICD-10-CM | POA: Diagnosis present

## 2022-09-24 DIAGNOSIS — J45909 Unspecified asthma, uncomplicated: Secondary | ICD-10-CM | POA: Diagnosis present

## 2022-09-24 DIAGNOSIS — K219 Gastro-esophageal reflux disease without esophagitis: Secondary | ICD-10-CM | POA: Diagnosis present

## 2022-09-24 DIAGNOSIS — Z7951 Long term (current) use of inhaled steroids: Secondary | ICD-10-CM | POA: Diagnosis not present

## 2022-09-24 DIAGNOSIS — M1990 Unspecified osteoarthritis, unspecified site: Secondary | ICD-10-CM | POA: Diagnosis present

## 2022-09-24 DIAGNOSIS — M353 Polymyalgia rheumatica: Secondary | ICD-10-CM | POA: Diagnosis present

## 2022-09-24 DIAGNOSIS — Z79899 Other long term (current) drug therapy: Secondary | ICD-10-CM | POA: Diagnosis not present

## 2022-09-24 DIAGNOSIS — M80051A Age-related osteoporosis with current pathological fracture, right femur, initial encounter for fracture: Secondary | ICD-10-CM

## 2022-09-24 DIAGNOSIS — I712 Thoracic aortic aneurysm, without rupture, unspecified: Secondary | ICD-10-CM | POA: Diagnosis present

## 2022-09-24 DIAGNOSIS — Z7983 Long term (current) use of bisphosphonates: Secondary | ICD-10-CM | POA: Diagnosis not present

## 2022-09-24 DIAGNOSIS — Z905 Acquired absence of kidney: Secondary | ICD-10-CM | POA: Diagnosis not present

## 2022-09-24 DIAGNOSIS — Y92009 Unspecified place in unspecified non-institutional (private) residence as the place of occurrence of the external cause: Secondary | ICD-10-CM | POA: Diagnosis not present

## 2022-09-24 DIAGNOSIS — Z823 Family history of stroke: Secondary | ICD-10-CM | POA: Diagnosis not present

## 2022-09-24 DIAGNOSIS — Z87442 Personal history of urinary calculi: Secondary | ICD-10-CM | POA: Diagnosis not present

## 2022-09-24 DIAGNOSIS — Z981 Arthrodesis status: Secondary | ICD-10-CM | POA: Diagnosis not present

## 2022-09-24 DIAGNOSIS — L309 Dermatitis, unspecified: Secondary | ICD-10-CM | POA: Diagnosis present

## 2022-09-24 DIAGNOSIS — H5461 Unqualified visual loss, right eye, normal vision left eye: Secondary | ICD-10-CM | POA: Diagnosis present

## 2022-09-24 DIAGNOSIS — Z8701 Personal history of pneumonia (recurrent): Secondary | ICD-10-CM | POA: Diagnosis not present

## 2022-09-24 DIAGNOSIS — N400 Enlarged prostate without lower urinary tract symptoms: Secondary | ICD-10-CM | POA: Diagnosis present

## 2022-09-24 DIAGNOSIS — Z886 Allergy status to analgesic agent status: Secondary | ICD-10-CM | POA: Diagnosis not present

## 2022-09-24 HISTORY — PX: ORIF FEMUR FRACTURE: SHX2119

## 2022-09-24 LAB — SURGICAL PCR SCREEN
MRSA, PCR: NEGATIVE
Staphylococcus aureus: NEGATIVE

## 2022-09-24 SURGERY — OPEN REDUCTION INTERNAL FIXATION (ORIF) DISTAL FEMUR FRACTURE
Anesthesia: General | Laterality: Right

## 2022-09-24 MED ORDER — ACETAMINOPHEN 10 MG/ML IV SOLN: 1000.0000 mg | Freq: Once | INTRAVENOUS | Status: DC | PRN

## 2022-09-24 MED ORDER — MORPHINE SULFATE (PF) 2 MG/ML IV SOLN: 0.5000 mg | INTRAVENOUS | Status: DC | PRN

## 2022-09-24 MED ORDER — SENNA 8.6 MG PO TABS
1.0000 | ORAL_TABLET | Freq: Every day | ORAL | Status: DC
  Administered 2022-09-24 – 2022-09-25 (×2): 8.6 mg via ORAL
  Filled 2022-09-24 (×2): qty 1

## 2022-09-24 MED ORDER — CHLORHEXIDINE GLUCONATE CLOTH 2 % EX PADS
6.0000 | MEDICATED_PAD | Freq: Every day | CUTANEOUS | Status: DC
  Administered 2022-09-24: 6 via TOPICAL

## 2022-09-24 MED ORDER — GLYCOPYRROLATE PF 0.2 MG/ML IJ SOSY
PREFILLED_SYRINGE | INTRAMUSCULAR | Status: AC
  Filled 2022-09-24: qty 1

## 2022-09-24 MED ORDER — OXYCODONE HCL 5 MG PO TABS
2.5000 mg | ORAL_TABLET | ORAL | Status: DC | PRN
  Administered 2022-09-24 – 2022-09-25 (×3): 5 mg via ORAL
  Filled 2022-09-24 (×4): qty 1

## 2022-09-24 MED ORDER — PROMETHAZINE HCL 25 MG/ML IJ SOLN: 6.2500 mg | INTRAMUSCULAR | Status: DC | PRN

## 2022-09-24 MED ORDER — ENOXAPARIN SODIUM 40 MG/0.4ML IJ SOSY
40.0000 mg | PREFILLED_SYRINGE | INTRAMUSCULAR | Status: DC
  Administered 2022-09-25: 40 mg via SUBCUTANEOUS
  Filled 2022-09-24: qty 0.4

## 2022-09-24 MED ORDER — IBUPROFEN 600 MG PO TABS
600.0000 mg | ORAL_TABLET | Freq: Three times a day (TID) | ORAL | Status: DC
  Administered 2022-09-24 – 2022-09-25 (×2): 600 mg via ORAL
  Filled 2022-09-24 (×3): qty 1

## 2022-09-24 MED ORDER — LIDOCAINE 2% (20 MG/ML) 5 ML SYRINGE
INTRAMUSCULAR | Status: DC | PRN
  Administered 2022-09-24: 40 mg via INTRAVENOUS

## 2022-09-24 MED ORDER — ACETAMINOPHEN 500 MG PO TABS
500.0000 mg | ORAL_TABLET | Freq: Four times a day (QID) | ORAL | Status: DC
  Filled 2022-09-24: qty 1

## 2022-09-24 MED ORDER — PROPOFOL 10 MG/ML IV BOLUS
INTRAVENOUS | Status: DC | PRN
  Administered 2022-09-24: 100 mg via INTRAVENOUS

## 2022-09-24 MED ORDER — ACETAMINOPHEN 325 MG PO TABS: 325.0000 mg | ORAL_TABLET | Freq: Four times a day (QID) | ORAL | Status: DC | PRN

## 2022-09-24 MED ORDER — ONDANSETRON HCL 4 MG/2ML IJ SOLN
INTRAMUSCULAR | Status: DC | PRN
  Administered 2022-09-24: 4 mg via INTRAVENOUS

## 2022-09-24 MED ORDER — CEFAZOLIN SODIUM-DEXTROSE 2-4 GM/100ML-% IV SOLN
INTRAVENOUS | Status: AC
  Filled 2022-09-24: qty 100

## 2022-09-24 MED ORDER — SUGAMMADEX SODIUM 200 MG/2ML IV SOLN
INTRAVENOUS | Status: DC | PRN
  Administered 2022-09-24: 125 mg via INTRAVENOUS

## 2022-09-24 MED ORDER — FENTANYL CITRATE (PF) 100 MCG/2ML IJ SOLN
INTRAMUSCULAR | Status: AC
  Filled 2022-09-24: qty 2

## 2022-09-24 MED ORDER — HYDROCODONE-ACETAMINOPHEN 7.5-325 MG PO TABS: 1.0000 | ORAL_TABLET | ORAL | Status: DC | PRN

## 2022-09-24 MED ORDER — FENTANYL CITRATE (PF) 250 MCG/5ML IJ SOLN
INTRAMUSCULAR | Status: AC
  Filled 2022-09-24: qty 5

## 2022-09-24 MED ORDER — CEFAZOLIN SODIUM-DEXTROSE 2-4 GM/100ML-% IV SOLN
2.0000 g | INTRAVENOUS | Status: AC
  Administered 2022-09-24: 2 g via INTRAVENOUS

## 2022-09-24 MED ORDER — TRANEXAMIC ACID-NACL 1000-0.7 MG/100ML-% IV SOLN
1000.0000 mg | Freq: Once | INTRAVENOUS | Status: AC
  Administered 2022-09-24: 1000 mg via INTRAVENOUS
  Filled 2022-09-24: qty 100

## 2022-09-24 MED ORDER — TRANEXAMIC ACID-NACL 1000-0.7 MG/100ML-% IV SOLN
INTRAVENOUS | Status: AC
  Filled 2022-09-24: qty 100

## 2022-09-24 MED ORDER — FENTANYL CITRATE (PF) 100 MCG/2ML IJ SOLN
25.0000 ug | INTRAMUSCULAR | Status: DC | PRN
  Administered 2022-09-24 (×2): 50 ug via INTRAVENOUS

## 2022-09-24 MED ORDER — PANTOPRAZOLE SODIUM 20 MG PO TBEC
20.0000 mg | DELAYED_RELEASE_TABLET | Freq: Every day | ORAL | Status: DC
  Administered 2022-09-24 – 2022-09-25 (×2): 20 mg via ORAL
  Filled 2022-09-24 (×2): qty 1

## 2022-09-24 MED ORDER — HYDROCODONE-ACETAMINOPHEN 5-325 MG PO TABS: 1.0000 | ORAL_TABLET | ORAL | Status: DC | PRN

## 2022-09-24 MED ORDER — ALBUTEROL SULFATE (2.5 MG/3ML) 0.083% IN NEBU: 2.5000 mg | INHALATION_SOLUTION | Freq: Four times a day (QID) | RESPIRATORY_TRACT | Status: DC | PRN

## 2022-09-24 MED ORDER — ONDANSETRON HCL 4 MG/2ML IJ SOLN: 4.0000 mg | Freq: Four times a day (QID) | INTRAMUSCULAR | Status: DC | PRN

## 2022-09-24 MED ORDER — ONDANSETRON 4 MG PO TBDP: 4.0000 mg | ORAL_TABLET | Freq: Three times a day (TID) | ORAL | Status: DC | PRN

## 2022-09-24 MED ORDER — PANTOPRAZOLE SODIUM 20 MG PO TBEC: 20.0000 mg | DELAYED_RELEASE_TABLET | Freq: Every day | ORAL | Status: DC

## 2022-09-24 MED ORDER — PHENYLEPHRINE 80 MCG/ML (10ML) SYRINGE FOR IV PUSH (FOR BLOOD PRESSURE SUPPORT)
PREFILLED_SYRINGE | INTRAVENOUS | Status: DC | PRN
  Administered 2022-09-24: 160 ug via INTRAVENOUS

## 2022-09-24 MED ORDER — DUPILUMAB 300 MG/2ML ~~LOC~~ SOAJ: 300.0000 mg | SUBCUTANEOUS | Status: DC

## 2022-09-24 MED ORDER — PHENYLEPHRINE 80 MCG/ML (10ML) SYRINGE FOR IV PUSH (FOR BLOOD PRESSURE SUPPORT)
PREFILLED_SYRINGE | INTRAVENOUS | Status: AC
  Filled 2022-09-24: qty 10

## 2022-09-24 MED ORDER — MENTHOL 3 MG MT LOZG: 1.0000 | LOZENGE | OROMUCOSAL | Status: DC | PRN

## 2022-09-24 MED ORDER — HYDROCODONE-ACETAMINOPHEN 5-325 MG PO TABS: 1.0000 | ORAL_TABLET | ORAL | 0 refills | Status: DC | PRN

## 2022-09-24 MED ORDER — GLYCOPYRROLATE PF 0.2 MG/ML IJ SOSY
PREFILLED_SYRINGE | INTRAMUSCULAR | Status: DC | PRN
  Administered 2022-09-24: .2 mg via INTRAVENOUS

## 2022-09-24 MED ORDER — FINASTERIDE 5 MG PO TABS
5.0000 mg | ORAL_TABLET | Freq: Every day | ORAL | Status: DC
  Administered 2022-09-24 – 2022-09-25 (×2): 5 mg via ORAL
  Filled 2022-09-24 (×2): qty 1

## 2022-09-24 MED ORDER — FENTANYL CITRATE (PF) 250 MCG/5ML IJ SOLN
INTRAMUSCULAR | Status: DC | PRN
  Administered 2022-09-24: 100 ug via INTRAVENOUS
  Administered 2022-09-24 (×3): 50 ug via INTRAVENOUS

## 2022-09-24 MED ORDER — ONDANSETRON HCL 4 MG/2ML IJ SOLN
4.0000 mg | Freq: Once | INTRAMUSCULAR | Status: AC
  Administered 2022-09-24: 4 mg via INTRAVENOUS
  Filled 2022-09-24: qty 2

## 2022-09-24 MED ORDER — LIDOCAINE 2% (20 MG/ML) 5 ML SYRINGE
INTRAMUSCULAR | Status: AC
  Filled 2022-09-24: qty 5

## 2022-09-24 MED ORDER — LACTATED RINGERS IV SOLN: INTRAVENOUS | Status: DC

## 2022-09-24 MED ORDER — FLUTICASONE PROPIONATE 50 MCG/ACT NA SUSP
1.0000 | Freq: Every day | NASAL | Status: DC
  Administered 2022-09-25: 1 via NASAL
  Filled 2022-09-24: qty 16

## 2022-09-24 MED ORDER — POVIDONE-IODINE 10 % EX SWAB
2.0000 | Freq: Once | CUTANEOUS | Status: AC
  Administered 2022-09-24: 2 via TOPICAL

## 2022-09-24 MED ORDER — AMISULPRIDE (ANTIEMETIC) 5 MG/2ML IV SOLN: 10.0000 mg | Freq: Once | INTRAVENOUS | Status: DC | PRN

## 2022-09-24 MED ORDER — POLYETHYLENE GLYCOL 3350 17 G PO PACK
17.0000 g | PACK | Freq: Every day | ORAL | Status: DC
  Administered 2022-09-24 – 2022-09-25 (×2): 17 g via ORAL
  Filled 2022-09-24 (×2): qty 1

## 2022-09-24 MED ORDER — ENOXAPARIN SODIUM 40 MG/0.4ML IJ SOSY: 40.0000 mg | PREFILLED_SYRINGE | INTRAMUSCULAR | 0 refills | Status: DC

## 2022-09-24 MED ORDER — FINASTERIDE 5 MG PO TABS: 5.0000 mg | ORAL_TABLET | Freq: Every day | ORAL | Status: DC

## 2022-09-24 MED ORDER — VANCOMYCIN HCL 1000 MG IV SOLR
INTRAVENOUS | Status: AC
  Filled 2022-09-24: qty 20

## 2022-09-24 MED ORDER — ONDANSETRON HCL 4 MG PO TABS: 4.0000 mg | ORAL_TABLET | Freq: Four times a day (QID) | ORAL | Status: DC | PRN

## 2022-09-24 MED ORDER — CEFAZOLIN SODIUM-DEXTROSE 2-4 GM/100ML-% IV SOLN
2.0000 g | Freq: Four times a day (QID) | INTRAVENOUS | Status: AC
  Administered 2022-09-24 (×2): 2 g via INTRAVENOUS
  Filled 2022-09-24 (×2): qty 100

## 2022-09-24 MED ORDER — DEXAMETHASONE SODIUM PHOSPHATE 10 MG/ML IJ SOLN
INTRAMUSCULAR | Status: DC | PRN
  Administered 2022-09-24: 5 mg via INTRAVENOUS

## 2022-09-24 MED ORDER — ORAL CARE MOUTH RINSE: 15.0000 mL | Freq: Once | OROMUCOSAL | Status: AC

## 2022-09-24 MED ORDER — MORPHINE SULFATE (PF) 4 MG/ML IV SOLN
4.0000 mg | Freq: Once | INTRAVENOUS | Status: AC
  Administered 2022-09-24: 4 mg via INTRAVENOUS
  Filled 2022-09-24: qty 1

## 2022-09-24 MED ORDER — DOCUSATE SODIUM 100 MG PO CAPS: 100.0000 mg | ORAL_CAPSULE | Freq: Two times a day (BID) | ORAL | Status: DC

## 2022-09-24 MED ORDER — ATORVASTATIN CALCIUM 10 MG PO TABS
10.0000 mg | ORAL_TABLET | Freq: Every day | ORAL | Status: DC
  Administered 2022-09-24 – 2022-09-25 (×2): 10 mg via ORAL
  Filled 2022-09-24 (×2): qty 1

## 2022-09-24 MED ORDER — ONDANSETRON HCL 4 MG/2ML IJ SOLN
INTRAMUSCULAR | Status: AC
  Filled 2022-09-24: qty 2

## 2022-09-24 MED ORDER — PREDNISONE 1 MG PO TABS
3.0000 mg | ORAL_TABLET | Freq: Every day | ORAL | Status: DC
  Filled 2022-09-24: qty 3

## 2022-09-24 MED ORDER — ACETAMINOPHEN 325 MG PO TABS: 650.0000 mg | ORAL_TABLET | Freq: Four times a day (QID) | ORAL | Status: DC | PRN

## 2022-09-24 MED ORDER — TRANEXAMIC ACID-NACL 1000-0.7 MG/100ML-% IV SOLN
1000.0000 mg | INTRAVENOUS | Status: AC
  Administered 2022-09-24: 1000 mg via INTRAVENOUS
  Filled 2022-09-24: qty 100

## 2022-09-24 MED ORDER — ONDANSETRON HCL 4 MG/2ML IJ SOLN: 4.0000 mg | Freq: Three times a day (TID) | INTRAMUSCULAR | Status: DC | PRN

## 2022-09-24 MED ORDER — DEXAMETHASONE SODIUM PHOSPHATE 10 MG/ML IJ SOLN
INTRAMUSCULAR | Status: AC
  Filled 2022-09-24: qty 1

## 2022-09-24 MED ORDER — OXYCODONE HCL 5 MG/5ML PO SOLN
ORAL | Status: AC
  Filled 2022-09-24: qty 5

## 2022-09-24 MED ORDER — CHLORHEXIDINE GLUCONATE 0.12 % MT SOLN: 15.0000 mL | Freq: Once | OROMUCOSAL | Status: AC

## 2022-09-24 MED ORDER — ROCURONIUM BROMIDE 10 MG/ML (PF) SYRINGE
PREFILLED_SYRINGE | INTRAVENOUS | Status: AC
  Filled 2022-09-24: qty 10

## 2022-09-24 MED ORDER — CHLORHEXIDINE GLUCONATE 0.12 % MT SOLN
OROMUCOSAL | Status: AC
  Administered 2022-09-24: 15 mL via OROMUCOSAL
  Filled 2022-09-24: qty 15

## 2022-09-24 MED ORDER — ATORVASTATIN CALCIUM 10 MG PO TABS: 10.0000 mg | ORAL_TABLET | Freq: Every day | ORAL | Status: DC

## 2022-09-24 MED ORDER — MUPIROCIN 2 % EX OINT
1.0000 | TOPICAL_OINTMENT | Freq: Two times a day (BID) | CUTANEOUS | Status: DC
  Filled 2022-09-24: qty 22

## 2022-09-24 MED ORDER — PHENOL 1.4 % MT LIQD: 1.0000 | OROMUCOSAL | Status: DC | PRN

## 2022-09-24 MED ORDER — CHLORHEXIDINE GLUCONATE 4 % EX LIQD
60.0000 mL | Freq: Once | CUTANEOUS | Status: DC
  Administered 2022-09-24: 4 via TOPICAL
  Filled 2022-09-24: qty 60

## 2022-09-24 MED ORDER — EPHEDRINE 5 MG/ML INJ
INTRAVENOUS | Status: AC
  Filled 2022-09-24: qty 5

## 2022-09-24 MED ORDER — OXYCODONE HCL 5 MG/5ML PO SOLN
5.0000 mg | Freq: Once | ORAL | Status: AC | PRN
  Administered 2022-09-24: 5 mg via ORAL

## 2022-09-24 MED ORDER — 0.9 % SODIUM CHLORIDE (POUR BTL) OPTIME
TOPICAL | Status: DC | PRN
  Administered 2022-09-24: 1000 mL

## 2022-09-24 MED ORDER — PREDNISONE 1 MG PO TABS
3.0000 mg | ORAL_TABLET | Freq: Every day | ORAL | Status: DC
  Administered 2022-09-24 – 2022-09-25 (×2): 3 mg via ORAL
  Filled 2022-09-24 (×3): qty 3

## 2022-09-24 MED ORDER — MOMETASONE FURO-FORMOTEROL FUM 200-5 MCG/ACT IN AERO
2.0000 | INHALATION_SPRAY | Freq: Two times a day (BID) | RESPIRATORY_TRACT | Status: DC
  Administered 2022-09-24 – 2022-09-25 (×3): 2 via RESPIRATORY_TRACT
  Filled 2022-09-24: qty 8.8

## 2022-09-24 MED ORDER — LOSARTAN POTASSIUM 50 MG PO TABS
50.0000 mg | ORAL_TABLET | Freq: Every day | ORAL | Status: DC
  Administered 2022-09-24 – 2022-09-25 (×2): 50 mg via ORAL
  Filled 2022-09-24 (×2): qty 1

## 2022-09-24 MED ORDER — ROCURONIUM BROMIDE 10 MG/ML (PF) SYRINGE
PREFILLED_SYRINGE | INTRAVENOUS | Status: DC | PRN
  Administered 2022-09-24: 60 mg via INTRAVENOUS

## 2022-09-24 MED ORDER — MORPHINE SULFATE (PF) 2 MG/ML IV SOLN
1.0000 mg | INTRAVENOUS | Status: DC | PRN
  Administered 2022-09-24: 2 mg via INTRAVENOUS
  Filled 2022-09-24: qty 1

## 2022-09-24 MED ORDER — EPHEDRINE SULFATE-NACL 50-0.9 MG/10ML-% IV SOSY
PREFILLED_SYRINGE | INTRAVENOUS | Status: DC | PRN
  Administered 2022-09-24 (×3): 5 mg via INTRAVENOUS

## 2022-09-24 MED ORDER — PROPOFOL 10 MG/ML IV BOLUS
INTRAVENOUS | Status: AC
  Filled 2022-09-24: qty 20

## 2022-09-24 SURGICAL SUPPLY — 46 items
BAG COUNTER SPONGE SURGICOUNT (BAG) ×1 IMPLANT
BIT DRILL 4.3X413 (BIT) ×1
BIT DRILL CANN 4.3X413 (BIT) IMPLANT
BIT DRILL OPENING REAMER ASSEM (INSTRUMENTS) ×1
BOLT FEM NECK SYS 90 LENGTH (Bolt) IMPLANT
BRUSH SCRUB EZ PLAIN DRY (MISCELLANEOUS) ×2 IMPLANT
CLSR STERI-STRIP ANTIMIC 1/2X4 (GAUZE/BANDAGES/DRESSINGS) IMPLANT
COVER PERINEAL POST (MISCELLANEOUS) ×1 IMPLANT
COVER SURGICAL LIGHT HANDLE (MISCELLANEOUS) ×2 IMPLANT
DERMABOND ADVANCED .7 DNX12 (GAUZE/BANDAGES/DRESSINGS) IMPLANT
DRAPE C-ARMOR (DRAPES) ×1 IMPLANT
DRAPE STERI IOBAN 125X83 (DRAPES) ×1 IMPLANT
DRSG AQUACEL AG ADV 3.5X 6 (GAUZE/BANDAGES/DRESSINGS) IMPLANT
DRSG MEPILEX BORDER 4X4 (GAUZE/BANDAGES/DRESSINGS) ×1 IMPLANT
ELECT REM PT RETURN 9FT ADLT (ELECTROSURGICAL) ×1
ELECTRODE REM PT RTRN 9FT ADLT (ELECTROSURGICAL) ×1 IMPLANT
GLOVE BIO SURGEON STRL SZ7.5 (GLOVE) ×1 IMPLANT
GLOVE BIO SURGEON STRL SZ8 (GLOVE) ×1 IMPLANT
GLOVE BIOGEL PI IND STRL 7.5 (GLOVE) ×1 IMPLANT
GLOVE BIOGEL PI IND STRL 8 (GLOVE) ×1 IMPLANT
GLOVE SURG ORTHO LTX SZ7.5 (GLOVE) ×2 IMPLANT
GOWN STRL REUS W/ TWL LRG LVL3 (GOWN DISPOSABLE) ×2 IMPLANT
GOWN STRL REUS W/ TWL XL LVL3 (GOWN DISPOSABLE) ×1 IMPLANT
GOWN STRL REUS W/TWL LRG LVL3 (GOWN DISPOSABLE) ×2
GOWN STRL REUS W/TWL XL LVL3 (GOWN DISPOSABLE) ×1
GUIDEWIRE 3.2X400 (WIRE) IMPLANT
KIT BASIN OR (CUSTOM PROCEDURE TRAY) ×1 IMPLANT
KIT TURNOVER KIT B (KITS) ×1 IMPLANT
MANIFOLD NEPTUNE II (INSTRUMENTS) ×1 IMPLANT
NS IRRIG 1000ML POUR BTL (IV SOLUTION) ×1 IMPLANT
PACK GENERAL/GYN (CUSTOM PROCEDURE TRAY) ×1 IMPLANT
PAD ARMBOARD 7.5X6 YLW CONV (MISCELLANEOUS) ×2 IMPLANT
PLATE FEM NECK 1H (Plate) IMPLANT
REAMER DRILL BIT 10.2X251 (INSTRUMENTS) IMPLANT
SCREW ANTIROTATN FEM NECH 90 L (Screw) IMPLANT
SCREW LOCK ST 5.0X40 (Screw) IMPLANT
STAPLER VISISTAT 35W (STAPLE) ×1 IMPLANT
SUT VIC AB 0 CT1 27 (SUTURE) ×1
SUT VIC AB 0 CT1 27XBRD ANBCTR (SUTURE) ×1 IMPLANT
SUT VIC AB 1 CT1 27 (SUTURE) ×1
SUT VIC AB 1 CT1 27XBRD ANBCTR (SUTURE) ×1 IMPLANT
SUT VIC AB 2-0 CT1 27 (SUTURE) ×1
SUT VIC AB 2-0 CT1 TAPERPNT 27 (SUTURE) ×1 IMPLANT
TOWEL GREEN STERILE (TOWEL DISPOSABLE) ×2 IMPLANT
TOWEL GREEN STERILE FF (TOWEL DISPOSABLE) ×1 IMPLANT
WATER STERILE IRR 1000ML POUR (IV SOLUTION) ×1 IMPLANT

## 2022-09-24 NOTE — Transfer of Care (Signed)
Immediate Anesthesia Transfer of Care Note  Patient: Brian Leon  Procedure(s) Performed: OPEN REDUCTION INTERNAL FIXATION (ORIF) FEMUR FRACTURE (Right)  Patient Location: PACU  Anesthesia Type:General  Level of Consciousness: awake and alert   Airway & Oxygen Therapy: Patient Spontanous Breathing and Patient connected to face mask oxygen  Post-op Assessment: Report given to RN and Post -op Vital signs reviewed and stable  Post vital signs: Reviewed and stable  Last Vitals:  Vitals Value Taken Time  BP 154/87 09/24/22 1233  Temp    Pulse 95 09/24/22 1236  Resp 14 09/24/22 1236  SpO2 95 % 09/24/22 1236  Vitals shown include unvalidated device data.  Last Pain:  Vitals:   09/24/22 1008  TempSrc: Oral  PainSc:          Complications: No notable events documented.

## 2022-09-24 NOTE — Anesthesia Procedure Notes (Signed)
Procedure Name: Intubation Date/Time: 09/24/2022 11:24 AM  Performed by: Reece Agar, CRNAPre-anesthesia Checklist: Patient identified, Emergency Drugs available, Suction available and Patient being monitored Patient Re-evaluated:Patient Re-evaluated prior to induction Oxygen Delivery Method: Circle System Utilized Preoxygenation: Pre-oxygenation with 100% oxygen Induction Type: IV induction Ventilation: Mask ventilation without difficulty Laryngoscope Size: Mac and 4 Grade View: Grade I Tube type: Oral Tube size: 7.5 mm Number of attempts: 1 Airway Equipment and Method: Stylet Placement Confirmation: ETT inserted through vocal cords under direct vision, positive ETCO2 and breath sounds checked- equal and bilateral Secured at: 23 cm Tube secured with: Tape Dental Injury: Teeth and Oropharynx as per pre-operative assessment

## 2022-09-24 NOTE — ED Notes (Signed)
Pt's R hip pain down from 4/10 to 1/10 after Morphine

## 2022-09-24 NOTE — Op Note (Signed)
OPERATIVE NOTE  Brian Leon male 76 y.o. 09/24/2022  PREOPERATIVE DIAGNOSIS: Right nondisplaced valgus femoral neck fracture Osteoporosis with right nondisplaced valgus impacted femoral neck insufficiency fracture  POSTOPERATIVE DIAGNOSIS: Right nondisplaced valgus femoral neck fracture (X11.552C) Osteoporosis with right nondisplaced valgus impacted femoral neck insufficiency fracture (M80.051A)  PROCEDURE(S): Open treatment right femoral neck fracture with internal fixation (80223) Operative use of fluoroscopy for above procedure(s) (36122)   SURGEON: Georgeanna Harrison, M.D.  ASSISTANT(S): None  ANESTHESIA: General  FINDINGS: Preoperative Examination: Right Lower Extremity: Inspection: Atraumatic Palpation: Tender to palpation deep in groin over hip ROM: Hip motion limited due to pain Strength: Normal dorsiflexion, plantarflexion, EHL Sensation: Intact light touch superficial peroneal, deep peroneal, tibial distributions Skin: Intact Peripheral Vascular: 2+ DP, WWP  Operative Findings: Operative fluoroscopy prior to the case redemonstrated nondisplaced valgus impacted right femoral neck fracture, amenable to stabilization with internal fixation.  Internal fixation with Synthes FNS device, appropriate placement confirmed on orthogonal AP and lateral fluoroscopic views with maintenance of fracture alignment.  IMPLANTS: Implant Name Type Inv. Item Serial No. Manufacturer Lot No. LRB No. Used Action  PLATE FEM NECK 1H - [id number] Plate PLATE FEM NECK 1H  [employer] [id number] Right 1 Implanted  BOLT FEM NECK SYS 90 LENGTH - [id number] Bolt BOLT FEM NECK SYS 90 LENGTH  [employer] [id number] Right 1 Implanted  SCREW LOCK ST 5.0X40 - [id number] Screw SCREW LOCK ST 5.0X40  [employer] [id number] Right 1 Implanted  SCREW ANTIROTATN FEM NECH 90 L - [id number] Screw SCREW ANTIROTATN FEM NECH 90 L  [employer] [id number] Right 1 Implanted    INDICATIONS:  The  patient is a 76 y.o. male who sustained mechanical fall at his home when he missed the bottom stair and fell.  He went to his primary care doctor where x-rays were obtained which subsequently demonstrated a right nondisplaced valgus packed femoral neck fracture and he was referred to the emergency room, where he did elect to undergo surgical treatment with internal fixation to stabilize the fracture and promote early return to weightbearing as tolerated.  He understood the risks, benefits and alternatives to surgery which include but are not limited to bleeding, wound healing complications, infection, damage to surrounding structures, persistent pain, stiffness, lack of improvement, potential for subsequent arthritis or worsening of pre-existing arthritis, nonunion, malunion, and need for further surgery, as well as complications related to anesthesia, cardiovascular complications, and death.  He also understood the potential for continued pain, and that there were no guarantees of acceptable outcome.  After weighing these risks the patient opted to proceed with surgery.  TECHNIQUE: Patient was identified in the preoperative holding area.  The right hip was confirmed as the appropriate operative site and marked by me.  Consent was signed by myself and the patient and witnessed by the preoperative nurse.  No block was performed by anesthesia in the preoperative holding area.  Patient was taken to the operative suite and placed supine on the operative table.  Anesthesia was induced by the anesthesia team.  The patient was positioned appropriately for the procedure and all bony prominences were well padded.  A tourniquet was not used.  Preoperative antibiotics were given. The extremity was prepped and draped in the usual sterile fashion and surgical timeout was performed.  Surgery was performed on the Hana table.  Fluoroscopic assessment of the right hip prior to procedure demonstrated a nondisplaced valgus impacted  femoral neck fracture amenable to internal fixation.  Surface anatomy was identified fluoroscopically  and marked out laterally as well as the parameters of the neck.  This was used to guide the location of lateral incision, distal to the trochanteric ridge.  Skin was intact sharply.  Underlying subcutaneous tissue was dissected with Bovie electrocautery down to the IT band.  IT band was split bluntly in line with fibers.  The underlying muscle was divided with Bovie electrocautery exposing the lateral femoral cortex.  A guidewire was advanced in center center position proximally through the femoral neck under fluoroscopic guidance, confirming center center position on orthogonal AP and lateral fluoroscopic views.  Drill path for the bolt was created over the wire, and the plate and bolt were impacted into position.  The single interlock screw was drilled and placed through the plate with the jig.  The second interlocking proximal head screw was then drilled and placed through the jig.  Final x-rays demonstrated appropriate placement of internal fixation with maintenance of fracture alignment.  Wound was copiously irrigated hemostasis was obtained.  1 g vancomycin powder was placed deep in the wound.  Layered closure was performed.  IT band was closed with interrupted figure-of-eight #1 PDS.  Deep fat layer was closed with running #0 Vicryl.  Deep dermal layer was closed with simple inverted interrupted #2-0 Monocryl, followed by running #3 Monocryl subcuticular.  Skin was sealed Dermabond.  Suture tails secured with Steri-Strips.  Opsil dressings placed over the wound.  Patient was awakened from anesthesia and transferred back in stable condition.  He tolerated procedure well.  No complications were noted intraoperatively.  POSTOPERATIVE INSTRUCTIONS: Mobility: Out of bed with PT/OT Pain control: Continue to wean/titrate to appropriate oral regimen DVT Prophylaxis: Lovenox 40 mg daily x 6 weeks  postoperatively Further surgical plans: None RLE: Weightbearing as tolerated, no restrictions Dressing care: Keep AQUACEL on and dry for up to 14 days.  Do not allow surgical area to get wet before that.  Remove AQUACEL dressing after 14 days and allow area to get wet in shower but DO NOT SUBMERGE until wound is evaluated in clinic.  In most cases skin glue is used and no additional dressing is necessary.  Disposition: Per primary team as medically appropriate Follow-up: Please call [facility] [phone]) to schedule follow-op appointment for 2 weeks after surgery.  TOURNIQUET TIME: * No tourniquets in log *  BLOOD LOSS: 50 mL         DRAINS: None         SPECIMEN: None       COMPLICATIONS:  * No complications entered in OR log *         DISPOSITION: PACU - hemodynamically stable.         CONDITION: stable   Georgeanna Harrison M.D. Orthopaedic Surgery [facility]   Portions of the record have been created with voice recognition software.  Grammatical and punctuation errors, random word insertions, wrong-word or "sound-a-like" substitutions, pronoun errors (inaccuracies and/or substitutions), and/or incomplete sentences may have occurred due to the inherent limitations of voice recognition software.  Not all errors are caught or corrected.  Although every attempt is made to root out erroneous and incomplete transcription, the note may still not fully represent the intent or opinion of the author.  Read the chart carefully and recognize, using context, where errors/substitutions have occurred.  Any questions or concerns about the content of this note or information contained within the body of this dictation should be addressed directly with the author for clarification.

## 2022-09-24 NOTE — Care Plan (Signed)
Patient discussed with EDP, imaging reviewed. R nondisplaced valgus impacted femoral neck fx.  NWB RLE NPO Admit to medicine: clearance/optimization/risk stratification Pain control OR 09/24/2022 AM for fixation  Full consult to follow.  Georgeanna Harrison M.D. Orthopaedic Surgery [facility]

## 2022-09-24 NOTE — H&P (Addendum)
Hospital Admission History and Physical Service Pager: [phone]  Patient name: Brian Leon Medical record number: [id number] Date of Birth: 05-24-1946 Age: 76 y.o. Gender: male  Primary Care Provider: Arlyce Dice, MD Consultants: Orthopedics Code Status: Full code  which was confirmed  Preferred Emergency Contact: Wife  Contact Information     Name Relation Home Work Mobile   McLean,Hope Significant other [phone]  [phone]      Chief Complaint: Fall with right hip fracture   Assessment and Plan: Brian Leon is a 76 y.o. male presenting with right hip fracture. Hip fracture in the setting of mechanical fall and PMH significant for osteoporosis on Prolia.   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 Pt has h/o osteoporosis DEXA in 2022 T score -2.8. Currently on Prolia. Orthopedic surgery consulted in the ED.  - Admit to [place], Attending Dr. Thompson Grayer  - Orthopedic Surgery consulted, appreciate recs   - Plan to do surgery in AM  - NPO  - F/u vitamin D  - PT and OT eval and treat - Pain regimen: oxycodone 2.5-'5mg'$  q4hrs prn for moderate pain, morphine 1-2 mg q 4hrs prn for severe pain   - avoid NSAIDs for surgery, avoid tylenol due to side effects/patient request - Zofran '4mg'$  q8hrs prn for nausea    Chronic Conditions Polymyalgia Rheumatica: Continue home prednisone 3 mg daily  HLD: Continue home atorvastatin 10 mg  GERD: continue protonix 20 mg daily  BPH: Continue home finasteride 5 mg  Asthma: Continue dulera in place of home advair, flonase, albuterol as needed   FEN/GI: NPO for surgery  VTE Prophylaxis: None at this time, will order after surgery   Disposition: Admit to Med-surg  History of Present Illness:  Brian Leon is a 76 y.o. male presenting with right hip fracture.   Patient fell when  descending stairs on 12/20 at 5pm. He missed the last step on concrete and fell and rolled onto his right side. He says he did not even scratch his hands or have any bruises. He says his PCP clinic did not have a clinic appointment until the next day. Patient went to clinic on 12/21. Patient says he does have history of syncope and was seen by cardiology for this, but denies any prodromal symptoms for this fall.  He has had to use two canes to move around after the fall almost like crutches. He says he is surprisingly is not in that much pain. He reports that he gets an upset stomach when he takes tylenol. He does not want to be on it in the hospital.   Of note, patient reports he is on Prolia only for osteoporosis. He stopped taking vitamin D a while ago because he saw no benefit. He reports normal vit D on recent lab checks.   *for clarity, this patient likely fell Tuesday 12/19 at 5pm and probably was told there were no clinic appointments Wednesday (as we were closed Wednesday afternoon).*  In the ED, patient's vitals were stable. Orthopedic surgery was consulted and evaluated patient with plan to do surgery 12/23 AM. Patient was made NPO in the ED.  Received morphine 4 mg and zofran in the ED.   Review Of Systems: Per HPI  Pertinent Past Medical History: Osteoporosis on Prolia  PMR on steroids  Asthma  Eczema  Remainder reviewed in  history tab.   Pertinent Past Surgical History: Right partial nephrectomy  Foot fracture surgery  Back surgery   Inguinal hernia repair  Remainder reviewed in history tab.   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  Lives with wife  Pertinent Family History: Remainder reviewed in history tab.   Important Outpatient Medications: Atorvastatin  Prolia  Prednisone  Protonix  Finasteride  Advair   Albuterol  Flonase  Dupixent  Remainder reviewed in medication history.   Objective: BP 134/72   Pulse 64   Temp 98.2 F (36.8 C) (Oral)   Resp 18   Ht '5\' 9"'$  (1.753 m)   Wt 61.2 kg   SpO2 99%   BMI 19.94 kg/m  Exam: General: Well appearing, comfortable  ENTM: atraumatic head, mucous membranes moist Neck: supple, normal ROM  Cardiovascular: RRR, pulses equal radially and dorsal pedally, both feet appropriately warm, cap refill < 2 seconds Respiratory: Normal work of breathing on room air  Gastrointestinal: No tenderness, non distended   MSK: both legs externally rotated at rest, right leg very slightly shortened, able to move around in bed without too much discomfort Neuro: Alert and oriented x4  Labs:  CBC BMET  Recent Labs  Lab 09/23/22 2156  WBC 14.0*  HGB 11.6*  HCT 35.9*  PLT 365   Recent Labs  Lab 09/23/22 2156  NA 137  K 4.2  CL 109  CO2 20*  BUN 12  CREATININE 0.92  GLUCOSE 92  CALCIUM 8.7*     EKG: Rate 64, regular, PR interval 207, Qtc 479, 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   Lumbar spondylosis. Degenerative changes are noted in both hips with small bony spurs.   Arteriosclerosis.    Lowry Ram, MD 09/24/2022, 4:15 AM PGY-1, [place] Intern pager: [phone], text pages welcome Secure chat group [facility] Teaching Service   Upper Level Addendum: I have seen and evaluated this patient along with Dr. Gwendolyn Lima and reviewed the above note, making necessary revisions as appropriate. I agree with the medical decision making and physical exam as noted above. Ezequiel Essex, MD PGY-3 [facility]

## 2022-09-24 NOTE — Assessment & Plan Note (Addendum)
Right impacted non displaced fracture of femoral neck from mechanical fall. Pt has h/o osteoporosis DEXA in 2022 T score -2.8. Currently on Prolia. Orthopedic surgery consulted in the ED.  - Admit to [place], Attending Dr. Thompson Grayer  - Orthopedic Surgery consulted, appreciate recs   - Plan to do surgery in AM  - NPO  - F/u vitamin D  - PT and OT eval and treat - Pain regimen: oxycodone 2.5-'5mg'$  q4hrs prn for moderate pain, morphine 1-2 mg q 4hrs prn for severe pain   - avoid NSAIDs for surgery, avoid tylenol due to side effects/patient request - Zofran '4mg'$  q8hrs prn for nausea

## 2022-09-24 NOTE — Evaluation (Signed)
Physical Therapy Evaluation Patient Details Name: Brian Leon MRN: [id number] DOB: 23-Apr-1946 Today's Date: 09/24/2022  History of Present Illness  Admitted for r femur ORIF after a fall on 09/20/22. PMH significant for but not limited to: cervical fusion, Polymyalgia Rheumatica, BPH, asthma, HLD, GERD  Clinical Impression  Patient is s/p above surgery resulting in functional limitations due to the deficits listed below (see PT Problem List).  Patient will benefit from skilled PT to increase their independence and safety with mobility to allow discharge home.  PT will return in AM to practice stairs. Patient moving very well considering surgery earlier today.        Recommendations for follow up therapy are one component of a multi-disciplinary discharge planning process, led by the attending physician.  Recommendations may be updated based on patient status, additional functional criteria and insurance authorization.  Follow Up Recommendations Home health PT (If pt interested)      Assistance Recommended at Discharge Set up Supervision/Assistance  Patient can return home with the following  A little help with walking and/or transfers;Help with stairs or ramp for entrance    Equipment Recommendations Rolling walker (2 wheels)  Recommendations for Other Services       Functional Status Assessment Patient has had a recent decline in their functional status and demonstrates the ability to make significant improvements in function in a reasonable and predictable amount of time.     Precautions / Restrictions Precautions Precautions: Fall Restrictions Weight Bearing Restrictions: Yes RLE Weight Bearing: Weight bearing as tolerated      Mobility  Bed Mobility Overal bed mobility: Modified Independent             General bed mobility comments: used UE to help RLE out of the bed    Transfers Overall transfer level: Needs assistance Equipment used: Rolling walker (2  wheels) Transfers: Sit to/from Stand Sit to Stand: Supervision                Ambulation/Gait Ambulation/Gait assistance: Min guard Gait Distance (Feet): 150 Feet Assistive device: Rolling walker (2 wheels) Gait Pattern/deviations: Step-to pattern, Antalgic       General Gait Details: no loss of balance  Stairs Stairs:  (Verbally discussed)          Wheelchair Mobility    Modified Rankin (Stroke Patients Only)       Balance Overall balance assessment: History of Falls (had recent fall on stairs)                                           Pertinent Vitals/Pain Pain Assessment Pain Assessment: 0-10 Pain Score: 3  Pain Location: RLE Pain Intervention(s): Limited activity within patient's tolerance, Monitored during session, Ice applied    Home Living Family/patient expects to be discharged to:: Private residence Living Arrangements: Spouse/significant other Available Help at Discharge: Family;Available 24 hours/day Type of Home: House Home Access: Stairs to enter Entrance Stairs-Rails: Right Entrance Stairs-Number of Steps: 2 Alternate Level Stairs-Number of Steps: flight Home Layout: Two level;Bed/bath upstairs Home Equipment: Cane - single point;Cane - quad      Prior Function Prior Level of Function : Independent/Modified Independent                     Hand Dominance   Dominant Hand: Right    Extremity/Trunk Assessment   Upper Extremity Assessment Upper Extremity  Assessment: Overall WFL for tasks assessed    Lower Extremity Assessment Lower Extremity Assessment: RLE deficits/detail RLE Deficits / Details: decreased AROM and strength secondary to fracture and repair    Cervical / Trunk Assessment Cervical / Trunk Assessment: Normal  Communication   Communication: No difficulties  Cognition Arousal/Alertness: Awake/alert Behavior During Therapy: WFL for tasks assessed/performed Overall Cognitive Status: Within  Functional Limits for tasks assessed                                          General Comments General comments (skin integrity, edema, etc.): no family present    Exercises Total Joint Exercises Ankle Circles/Pumps: AROM, Both, 5 reps   Assessment/Plan    PT Assessment Patient needs continued PT services  PT Problem List Decreased strength;Decreased range of motion;Decreased mobility;Decreased knowledge of use of DME;Decreased knowledge of precautions       PT Treatment Interventions DME instruction;Gait training;Stair training;Therapeutic activities;Therapeutic exercise;Patient/family education    PT Goals (Current goals can be found in the Care Plan section)  Acute Rehab PT Goals Patient Stated Goal: to go home tomorrow AM PT Goal Formulation: With patient Time For Goal Achievement: 10/01/22 Potential to Achieve Goals: Good    Frequency Min 5X/week     Co-evaluation               AM-PAC PT "6 Clicks" Mobility  Outcome Measure Help needed turning from your back to your side while in a flat bed without using bedrails?: None Help needed moving from lying on your back to sitting on the side of a flat bed without using bedrails?: None Help needed moving to and from a bed to a chair (including a wheelchair)?: A Little Help needed standing up from a chair using your arms (e.g., wheelchair or bedside chair)?: A Little Help needed to walk in hospital room?: A Little Help needed climbing 3-5 steps with a railing? : A Little 6 Click Score: 20    End of Session Equipment Utilized During Treatment: Gait belt Activity Tolerance: Patient tolerated treatment well Patient left: in chair;with call bell/phone within reach;with chair alarm set Nurse Communication: Other (comment) (Pt session complete for IV to be hooked up) PT Visit Diagnosis: Unsteadiness on feet (R26.81)    Time: 1550-1620 PT Time Calculation (min) (ACUTE ONLY): 30 min   Charges:   PT  Evaluation $PT Eval Moderate Complexity: 1 Mod PT Treatments $Gait Training: 8-22 mins        Lavonia Dana, [place]  Office [phone] 09/24/2022   Melvern Banker 09/24/2022, 4:52 PM

## 2022-09-24 NOTE — Progress Notes (Addendum)
FMTS Interim Progress Note  S: Patient assessed at bedside with Dr. Rock Nephew after ORIF of R femur. Reiterated he does not want to take Tylenol as it made him feel bad many years ago. Willing to try it again outpatient but does not want to try it now when he is recovering. Requesting something for pain now. States he is feeling nauseous and not interested in eating but would not like medication for it. Denies passing gas or BM. Requesting to go home today but discussed the need for PT evaluation and pain control prior to discharge.  O: BP (!) 140/92 (BP Location: Left Arm)   Pulse 75   Temp 97.8 F (36.6 C) (Oral)   Resp 16   Ht 5' 9.02" (1.753 m)   Wt 59.7 kg   SpO2 93%   BMI 19.43 kg/m    General: Elderly male. Alert. NAD CV: RRR without murmur Pulm: CTAB. Normal WOB on RA. No wheezing Abdomen: Soft, non-tender, non-distended. Hypoactive bowel sounds Ext: Motor and sensation intact in R leg. Clear, dry bandage over incision. Skin: Warm, dry. No rashes noted   A/P: ORIF R femur fracture -Pain management: Ibuprofen '600mg'$ , Oxycodone 2.5-'5mg'$  prn, Morphine 0.5-'1mg'$  prn -Bowel regimen: Miralax and Senna -D/c Tylenol -PT consult for imminent discharge, weight bearing as tolerated -Regular diet -Lovenox '40mg'$  x 6 weeks per ortho   Colletta Maryland, MD 09/24/2022, 3:07 PM PGY-1, [place] [facility] pager [phone]

## 2022-09-24 NOTE — Anesthesia Postprocedure Evaluation (Signed)
Anesthesia Post Note  Patient: Brian Leon  Procedure(s) Performed: OPEN REDUCTION INTERNAL FIXATION (ORIF) FEMUR FRACTURE (Right)     Patient location during evaluation: PACU Anesthesia Type: General Level of consciousness: awake and alert Pain management: pain level controlled Vital Signs Assessment: post-procedure vital signs reviewed and stable Respiratory status: spontaneous breathing, nonlabored ventilation, respiratory function stable and patient connected to nasal cannula oxygen Cardiovascular status: blood pressure returned to baseline and stable Postop Assessment: no apparent nausea or vomiting Anesthetic complications: no   No notable events documented.  Last Vitals:  Vitals:   09/24/22 1305 09/24/22 1349  BP: (!) 159/92 (!) 140/92  Pulse: 90 75  Resp: 14 16  Temp: 36.8 C 36.6 C  SpO2: 94% 93%    Last Pain:  Vitals:   09/24/22 1349  TempSrc: Oral  PainSc:                  Effie Berkshire
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
## 2022-09-24 NOTE — ED Notes (Signed)
Attempted to give reportx1 

## 2022-09-24 NOTE — Progress Notes (Signed)
FMTS Brief Progress Note  S: Patient admitted after midnight, so no full progress note required. Saw patient. Reports decent pain control on morphine. Discussed that his surgery is currently scheduled for later this morning at 10:15 am in OR #5. He will call his wife soon to let her know and give her the opportunity to be here during his surgery.   O: BP (!) 140/76   Pulse (!) 57   Temp 98.2 F (36.8 C) (Oral)   Resp 15   Ht '5\' 9"'$  (1.753 m) Comment: per pt  Wt 59.7 kg   SpO2 98%   BMI 19.44 kg/m   Gen: awake, alert, NAD Resp: CTAB Card: RRR, no murmur  A/P: Right impacted femoral neck fracture (non-displaced) - Plan for surgery with ortho this morning - Will switch from SCD to Lovenox for DVT prophylaxis post-op - Day team to post-op check - Avoid tylenol in pain regimen given intolerance - AM labs and PT ordered for tomorrow  Polymyalgia rheumatica Wife to bring in [place] for routine administration on Monday. Non-formulary order entered.   Ezequiel Essex, MD 09/24/2022, 7:28 AM PGY-3, [facility] Night Resident  Please page [phone] with questions.

## 2022-09-24 NOTE — Hospital Course (Addendum)
Brian Leon is a 76 y.o.male with a history of osteoporosis, polymyalgia rheumatic (on steroids), asthma, eczema  who was admitted to the [facility] Medicine Teaching Service at [facility] for R hip fracture. Her hospital course is detailed below:  Right femur fracture Admitted for mechanical fall resulting in non-displaced fracture of R femoral neck. H/o osteoporosis on Prolia. Orthopedic surgery consulted and underwent successful ORIF. Pain well controlled post-op and early mobilization with PT. PT recommended home health PT which was ordered upon discharge along with rolling walker. Patient demonstrated appropriate activity post op and was stable prior to discharging home. Instructed to follow up with orthopedics outpatient in 2 weeks.   Other chronic conditions were medically managed with home medications and formulary alternatives as necessary (polymyalgia rheumatica, HLD, GERD, BPH, asthma)  PCP Follow-up Recommendations:  Discuss Tylenol allergy with patient -- willing to try taking it again  Please ensure patient is following up with ortho outpatient. Ensure that patient participating in PT as appropriate.

## 2022-09-24 NOTE — ED Provider Notes (Signed)
[facility] EMERGENCY DEPARTMENT Provider Note   CSN: [id number] Arrival date & time: 09/23/22  1757     History  Chief Complaint  Patient presents with   Hip Pain    Brian Leon is a 76 y.o. male.  HPI   Medical history including hypertension, thoracic aneurysm, presents to the emergency department after a fall.  Patient states 2 days ago he had a mechanical fall, states he tripped falling onto his right hip, he denies hitting his head or losing conscious, he is not on anticoag, not endorse any neck pain back pain chest pain, pain in the upper extremities, states pain is just localized in his right hip, does not radiate, denies a paresthesias or weakness moving down his leg no saddle paresthesias no urinary bowel incontinence.  Patient is that he has difficulty ambulating due to the pain, states has been using canes to support him.  Reviewed patient's chart was seen by his primary care doctor obtain plain film of the right hip shows right femoral neck fracture nondisplaced    Home Medications Prior to Admission medications   Medication Sig Start Date End Date Taking? Authorizing Provider  albuterol (VENTOLIN HFA) 108 (90 Base) MCG/ACT inhaler TAKE 2 PUFFS BY MOUTH EVERY 6 HOURS AS NEEDED FOR WHEEZE OR SHORTNESS OF BREATH 07/25/22   Arlyce Dice, MD  ADVAIR DISKUS 500-50 MCG/ACT AEPB INHALE 1 PUFF INTO THE LUNGS TWICE A DAY 09/23/22   Leslie Dales, DO  atorvastatin (LIPITOR) 10 MG tablet Take 1 tablet (10 mg total) by mouth daily. 11/12/21   Park Liter, MD  denosumab (PROLIA) 60 MG/ML SOSY injection Inject 60 mg into the skin every 6 (six) months. Deliver to clinic: [address] [place], [place], [place] [address] by 04/08/22 03/30/22 03/30/23  Ofilia Neas, PA-C  dicyclomine (BENTYL) 10 MG capsule Take 1 capsule (10 mg total) by mouth 4 (four) times daily -  before meals and at bedtime. Patient not taking: Reported on 04/11/2022 03/31/22   Gladys Damme, MD  DUPIXENT 300 MG/2ML SOSY Inject 300 mg into the skin every 14 (fourteen) days. Every other Monday 06/28/18   [provider]  fidaxomicin (DIFICID) 200 MG TABS tablet Take 1 tablet (200 mg total) by mouth 2 (two) times daily. 05/12/22   [name], DO  finasteride (PROSCAR) 5 MG tablet Take 5 mg by mouth daily.    [provider]  fluticasone (FLONASE) 50 MCG/ACT nasal spray SPRAY 2 SPRAYS INTO EACH NOSTRIL EVERY DAY 06/23/22   Arlyce Dice, MD  ondansetron (ZOFRAN) 4 MG tablet Take 1 tablet (4 mg total) by mouth every 8 (eight) hours as needed for nausea or vomiting. Patient not taking: Reported on 04/11/2022 03/31/22   Gladys Damme, MD  pantoprazole (PROTONIX) 20 MG tablet TAKE 1 TABLET BY MOUTH EVERY DAY Patient taking differently: Take 20 mg by mouth daily. 02/09/22   Autry-Lott, [name], DO  predniSONE (DELTASONE) 1 MG tablet Take 3 tablets (3 mg total) by mouth daily with breakfast. 09/07/22   Ofilia Neas, PA-C      Allergies    Sulfa drugs cross reactors and Acetaminophen    Review of Systems   Review of Systems  Constitutional:  Negative for chills and fever.  Respiratory:  Negative for shortness of breath.   Cardiovascular:  Negative for chest pain.  Gastrointestinal:  Negative for abdominal pain.  Musculoskeletal:        Right hip pain  Neurological:  Negative  for headaches.    Physical Exam Updated Vital Signs BP 131/78 (BP Location: Right Arm)   Pulse 74   Temp 98.8 F (37.1 C) (Oral)   Resp 16   Ht '5\' 9"'$  (1.753 m)   Wt 61.2 kg   SpO2 97%   BMI 19.94 kg/m  Physical Exam Vitals and nursing note reviewed.  Constitutional:      General: He is not in acute distress.    Appearance: He is not ill-appearing.  HENT:     Head: Normocephalic and atraumatic.     Comments: There is no deformity the head present no raccoon eyes or Battle sign noted.    Nose: No congestion.     Mouth/Throat:     Mouth: Mucous membranes are moist.      Pharynx: Oropharynx is clear.     Comments: No trismus no torticollis no oral trauma Eyes:     Extraocular Movements: Extraocular movements intact.     Conjunctiva/sclera: Conjunctivae normal.     Pupils: Pupils are equal, round, and reactive to light.     Comments: Patient has noted right glass eye, left eye was PERRLA, EOMs fully intact  Cardiovascular:     Rate and Rhythm: Normal rate and regular rhythm.     Pulses: Normal pulses.     Heart sounds: No murmur heard.    No friction rub. No gallop.  Pulmonary:     Effort: No respiratory distress.     Breath sounds: No wheezing, rhonchi or rales.  Musculoskeletal:     Comments: Spine was palpated was nontender to palpation no step-off or deformities noted, patient has noted focalized right hip pain, there is no leg shortening no internal/external rotation present, he has no tenderness on his knees or ankle, he is moving his toes ankle knee without difficulty unable to flex at the right hip due to pain.  Skin:    General: Skin is warm and dry.  Neurological:     Mental Status: He is alert.     Comments: No facial asymmetry no difficulty with word finding, following two-step commands there is no real weakness present.  Psychiatric:        Mood and Affect: Mood normal.     ED Results / Procedures / Treatments   Labs (all labs ordered are listed, but only abnormal results are displayed) Labs Reviewed  BASIC METABOLIC PANEL - Abnormal; Notable for the following components:      Result Value   CO2 20 (*)    Calcium 8.7 (*)    All other components within normal limits  CBC WITH DIFFERENTIAL/PLATELET - Abnormal; Notable for the following components:   WBC 14.0 (*)    RBC 3.57 (*)    Hemoglobin 11.6 (*)    HCT 35.9 (*)    MCV 100.6 (*)    Neutro Abs 10.9 (*)    Abs Immature Granulocytes 0.12 (*)    All other components within normal limits  PROTIME-INR  TYPE AND SCREEN    EKG None  Radiology DG HIP UNILAT WITH PELVIS 2-3  VIEWS RIGHT  Result Date: 09/23/2022 CLINICAL DATA:  Trauma, fall EXAM: DG HIP (WITH OR WITHOUT PELVIS) 2-3V RIGHT COMPARISON:  None Available. FINDINGS: Undisplaced fracture is seen in the neck of right femur. There is radiolucent line in the medial cortex in the midportion of neck. There is cortical irregularity in the lateral aspect of subcapital portion of the neck. There is no dislocation. Small bony spurs are seen  in both hips. Degenerative changes are noted in lumbar spine. Extensive arterial calcifications are seen in soft tissues. IMPRESSION: Undisplaced fracture is seen in the neck of proximal right femur. Lumbar spondylosis. Degenerative changes are noted in both hips with small bony spurs. Arteriosclerosis. Electronically Signed   By: Elmer Picker M.D.   On: 09/23/2022 16:58    Procedures Procedures    Medications Ordered in ED Medications  morphine (PF) 4 MG/ML injection 4 mg (has no administration in time range)  ondansetron (ZOFRAN) injection 4 mg (has no administration in time range)    ED Course/ Medical Decision Making/ A&P                           Medical Decision Making Amount and/or Complexity of Data Reviewed Labs: ordered.  Risk Prescription drug management. Decision regarding hospitalization.   This patient presents to the ED for concern of right hip pain, this involves an extensive number of treatment options, and is a complaint that carries with it a high risk of complications and morbidity.  The differential diagnosis includes fracture, dislocation, compartment syndrome    Additional history obtained:  Additional history obtained from N/A External records from outside source obtained and reviewed including PCP notes   Co morbidities that complicate the patient evaluation  N/A  Social Determinants of Health:  N/A    Lab Tests:  I Ordered, and personally interpreted labs.  The pertinent results include: CBC shows leukocytosis 14,  macrocytic anemia hemoglobin of 11.6, BMP shows CO2 of 20, type and screen negative   Imaging Studies ordered:  I ordered imaging studies including N/A I independently visualized and interpreted imaging which showed that I I agree with the radiologist interpretation   Cardiac Monitoring:  The patient was maintained on a cardiac monitor.  I personally viewed and interpreted the cardiac monitored which showed an underlying rhythm of: Without signs of ischemia   Medicines ordered and prescription drug management:  I ordered medication including morphine, Zofran I have reviewed the patients home medicines and have made adjustments as needed  Critical Interventions:  N/A   Reevaluation:  Presents with right-sided hip pain, triage obtain basic lab workup, exam is consistent with imaging showing a femoral neck fracture, will provide with pain medications, and consult with orthopedics for further recommendations  Reassessed the patient resting comfortably agreement plan will admit to hospitalist team    Consultations Obtained:  I requested consultation with the spoke with Dr. Mable Fill,  and discussed lab and imaging findings as well as pertinent plan - they recommend: Admit the patient, keep n.p.o. Spoke with Dr pray who will admit the patient      Test Considered:  N/A    Rule out I have low suspicion for septic arthritis as patient denies IV drug use, skin exam was performed no erythematous, edematous, warm joints noted on exam, no new heart murmur heard on exam.  Low suspicion for dislocation as x-ray negative for these findings.  Suspicion for spinal fracture/spinal cord abnormality is low as spine was palpated nontender to palpation he has no red flag symptoms.  I doubt intracranial head bleed not endorse any headaches change in vision paresthesias or weakness the upper or lower extremities denies any recent head trauma, not anticoag's, no focal deficits noted my exam.  Low  suspicion for compartment syndrome as area was palpated it was soft to the touch, neurovascular fully intact.     Dispostion and problem  list  After consideration of the diagnostic results and the patients response to treatment, I feel that the patent would benefit from admission.  Right hip fracture-remain n.p.o., will likely undergo surgery for right hip fracture.            Final Clinical Impression(s) / ED Diagnoses Final diagnoses:  None    Rx / DC Orders ED Discharge Orders     None         Aron Baba 09/24/22 [id number]    Quintella Reichert, MD 09/24/22 [id number]

## 2022-09-24 NOTE — Anesthesia Preprocedure Evaluation (Addendum)
Anesthesia Evaluation  Patient identified by MRN, date of birth, ID band Patient awake    Reviewed: Allergy & Precautions, NPO status , Patient's Chart, lab work & pertinent test results  Airway Mallampati: I  TM Distance: >3 FB Neck ROM: Full    Dental  (+) Teeth Intact, Dental Advisory Given   Pulmonary asthma , former smoker   breath sounds clear to auscultation       Cardiovascular hypertension, Pt. on medications  Rhythm:Regular Rate:Normal     Neuro/Psych  Headaches  negative psych ROS   GI/Hepatic ,GERD  Medicated and Controlled,,(+) Hepatitis -  Endo/Other  negative endocrine ROS    Renal/GU Renal disease     Musculoskeletal  (+) Arthritis ,    Abdominal   Peds  Hematology   Anesthesia Other Findings   Reproductive/Obstetrics                             Anesthesia Physical Anesthesia Plan  ASA: 2  Anesthesia Plan: General   Post-op Pain Management:    Induction: Intravenous  PONV Risk Score and Plan: 3 and Ondansetron and Treatment may vary due to age or medical condition  Airway Management Planned: Oral ETT  Additional Equipment: None  Intra-op Plan:   Post-operative Plan: Extubation in OR  Informed Consent: I have reviewed the patients History and Physical, chart, labs and discussed the procedure including the risks, benefits and alternatives for the proposed anesthesia with the patient or authorized representative who has indicated his/her understanding and acceptance.     Dental advisory given  Plan Discussed with: CRNA  Anesthesia Plan Comments:         Anesthesia Quick Evaluation

## 2022-09-24 NOTE — Discharge Instructions (Addendum)
Discharge instructions for Dr. Georgeanna Harrison, M.D., Orthopaedic Surgeon, [place]:  Diet: As you were doing prior to hospitalization, unless instructed otherwise by medical team, dietary/nutrition team, etc. Dressing:  Keep AQUACEL on and dry for up to 14 days.  Do not allow surgical area to get wet before that.  Remove AQUACEL dressing after 14 days and allow area to get wet in shower but DO NOT SUBMERGE until wound is evaluated in clinic.  In most cases skin glue is used and no additional dressing is necessary.  Shower:  Unless otherwise specified, may shower but keep the wounds dry, use an occlusive plastic wrap, NO SOAKING IN TUB.  If the bandage gets wet, change with a clean dry gauze.  Unless otherwise specified, after 5 days dressing(s) should be removed (7 days for PREVENA dressings) and wound(s) may get wet in the shower by allowing water to gently run over.  Again, no soaking in tub, and do NOT submerge for at least 4 weeks!!! Activity:  Increase activity slowly as tolerated.  If you right leg is injured or immobilized, no driving for 6 weeks or until discussed with your surgeon.  If you have an injury or immobilization of the left lower extremity you may not operate a clutch. Please note that driving with any kind of immobilization for the upper extremity (sling, shoulder brace, splint, cast, etc.) may also be considered impaired driving and should not be attempted. Weight Bearing: WEIGHTBEARING AS TOLERATED (WBAT) on the RIGHT LOWER EXTREMITY. To prevent constipation: You may use over-the-counter stool softener(s) such as Colace (over the counter) 100 mg by mouth twice a day and/or Miralax (over the counter) for constipation as needed.  Drink plenty of fluids (prune juice may be helpful) and high fiber foods.  Itching:  If you experience itching with your medications, try taking only a single pain pill, or even half a pain pill at a time.  You can also use  benadryl over the counter for itching or also to help with sleep.  Precautions:  If you experience chest pain or shortness of breath - call 911 immediately for transfer to the hospital emergency department!! Medications: Please contact the clinic during office hours (Monday through Friday, 0800 to 1600) if you need a refill on any medications.  Please monitor medications and allow 24 to 48 hours to process refill request!!!!  Please note that only medications directly related to the surgery can be prescribed.  For other medications (e.g. blood pressure medicines, sleeping medicines, etc.), please contact the prescribing physician or your primary care provider. DVT Prophylaxis: Lovenox 40 mg daily x6 weeks postop  If you develop a fever greater that 101.1 deg F, purulent drainage from wound, increased redness or drainage from wound, or calf pain -- Call the office at [phone].   Dear Brian Leon,   Thank you for letting us participate in your care! In this section, you will find a brief hospital admission summary of why you were admitted to the hospital, what happened during your admission, your diagnosis/diagnoses, and recommended follow up.  Primary diagnosis: Fracture of right hip Treatment plan: You underwent surgical repair of your R hip by orthopedic surgery   POST-HOSPITAL & CARE INSTRUCTIONS We recommend following up with your PCP within 1 week from being discharged from the hospital. Please let PCP/Specialists know of any changes in medications that were made which you will be able to see in the medications section of this packet. Please  also follow up with the orthopedic surgeon in 2 weeks.  DOCTOR'S APPOINTMENTS & FOLLOW UP Future Appointments  Date Time Provider Lane  09/29/2022 10:30 AM Donney Dice, DO [facility]  11/01/2022  9:40 AM Ofilia Neas, PA-C [facility] None     Thank you for choosing [facility]! Take care and be well!  [facility]  [place], [place] [address] [phone]

## 2022-09-24 NOTE — Progress Notes (Signed)
Family medicine teaching service will be admitting this patient. Our pager information can be located in the physician sticky notes, treatment team sticky notes, and the headers of all our official daily progress notes.   FAMILY MEDICINE TEACHING SERVICE Patient - Please contact intern pager [phone] or text page via website [email or web address] (login: [email or web address]) for questions regarding care. DO NOT page listed attending provider unless there is no answer from the number above.   Brian Forti, MD PGY-3, [place] [facility] pager [phone]   

## 2022-09-24 NOTE — ED Notes (Signed)
Attempted to give report x2 

## 2022-09-25 LAB — BASIC METABOLIC PANEL
Anion gap: 11 (ref 5–15)
BUN: 12 mg/dL (ref 8–23)
CO2: 20 mmol/L — ABNORMAL LOW (ref 22–32)
Calcium: 8.3 mg/dL — ABNORMAL LOW (ref 8.9–10.3)
Chloride: 103 mmol/L (ref 98–111)
Creatinine, Ser: 1.08 mg/dL (ref 0.61–1.24)
GFR, Estimated: >60 mL/min (ref >60)
Glucose, Bld: 153 mg/dL — ABNORMAL HIGH (ref 70–99)
Potassium: 4.2 mmol/L (ref 3.5–5.1)
Sodium: 134 mmol/L — ABNORMAL LOW (ref 135–145)

## 2022-09-25 LAB — CBC
HCT: 31.4 % — ABNORMAL LOW (ref 39.0–52.0)
Hemoglobin: 10.6 g/dL — ABNORMAL LOW (ref 13.0–17.0)
MCH: 33.3 pg (ref 26.0–34.0)
MCHC: 33.8 g/dL (ref 30.0–36.0)
MCV: 98.7 fL (ref 80.0–100.0)
Platelets: 304 10*3/uL (ref 150–400)
RBC: 3.18 MIL/uL — ABNORMAL LOW (ref 4.22–5.81)
RDW: 13.5 % (ref 11.5–15.5)
WBC: 11.5 10*3/uL — ABNORMAL HIGH (ref 4.0–10.5)
nRBC: 0 % (ref 0.0–0.2)

## 2022-09-25 LAB — VITAMIN D 25 HYDROXY (VIT D DEFICIENCY, FRACTURES): Vit D, 25-Hydroxy: 31.68 ng/mL (ref 30–100)

## 2022-09-25 MED ORDER — TRAMADOL HCL 50 MG PO TABS: 50.0000 mg | ORAL_TABLET | Freq: Four times a day (QID) | ORAL | 0 refills | Status: DC | PRN

## 2022-09-25 MED ORDER — SENNA 8.6 MG PO TABS: 1.0000 | ORAL_TABLET | Freq: Every day | ORAL | 0 refills | Status: DC | PRN

## 2022-09-25 MED ORDER — ENOXAPARIN SODIUM 40 MG/0.4ML IJ SOSY: 40.0000 mg | PREFILLED_SYRINGE | INTRAMUSCULAR | 0 refills | Status: DC

## 2022-09-25 MED ORDER — OXYCODONE HCL 5 MG PO TABS: 5.0000 mg | ORAL_TABLET | ORAL | 0 refills | Status: DC | PRN

## 2022-09-25 MED ORDER — POLYETHYLENE GLYCOL 3350 17 G PO PACK: 17.0000 g | PACK | Freq: Every day | ORAL | 0 refills | Status: DC | PRN

## 2022-09-25 MED ORDER — ASPIRIN 325 MG PO TBEC: 325.0000 mg | DELAYED_RELEASE_TABLET | Freq: Every day | ORAL | 3 refills | Status: DC

## 2022-09-25 NOTE — TOC Transition Note (Signed)
Transition of Care [facility]) - CM/SW Discharge Note   Patient Details  Name: Brian Leon MRN: [id number] Date of Birth: 1946/06/17  [facility]) CM/SW Contact:  Bartholomew Crews, RN Phone Number: [phone] 09/25/2022, 11:30 AM   Clinical Narrative:     Spoke with patient on hospital room phone to discuss post acute transition. Discussed recommendation for [facility] PT - patient declined [facility] at this time. Advised that if he changes his mind to discuss with his doctor who can refer him for [facility] as well. Discussed recommendation for RW - patient stated that he already has one. Patient has transportation home. No further TOC needs identified at this time.   Final next level of care: Home/Self Care Barriers to Discharge: No Barriers Identified   Patient Goals and CMS Choice CMS Medicare.gov Compare Post Acute Care list provided to:: Patient Choice offered to / list presented to : Patient  Discharge Placement                         Discharge Plan and Services Additional resources added to the After Visit Summary for                  DME Arranged: N/A DME Agency: NA       HH Arranged: Refused [facility] Agency: NA        Social Determinants of Health ([place]) Interventions SDOH Screenings   Food Insecurity: No Food Insecurity (09/24/2022)  Housing: Low Risk  (09/24/2022)  Transportation Needs: No Transportation Needs (09/24/2022)  Utilities: Not At Risk (09/24/2022)  Depression (PHQ2-9): Low Risk  (09/22/2022)  Physical Activity: Insufficiently Active (08/22/2018)  Tobacco Use: Medium Risk (09/24/2022)     Readmission Risk Interventions     No data to display

## 2022-09-25 NOTE — Discharge Summary (Signed)
[facility] Discharge Summary  Patient name: Brian Leon record number: [id number] Date of birth: 01-13-1946 Age: 76 y.o. 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 Her hospital course is detailed below:  Right femur fracture Admitted for mechanical fall resulting in non-displaced fracture of R femoral neck. H/o osteoporosis on Prolia. Orthopedic surgery consulted and underwent successful ORIF on 12/23. Pain well controlled post-op and early mobilization with PT. PT recommended home health PT which was ordered upon discharge along with rolling walker. Patient demonstrated appropriate activity post op and was stable prior to discharging home. Instructed to follow up with orthopedics outpatient in 2 weeks.   Other chronic conditions were medically managed with home medications and formulary alternatives as necessary (polymyalgia rheumatica, HLD, GERD, BPH, asthma)  Discharge Diagnoses/Problem List:  * Femur fracture, right ([place])   Disposition: home with HHPT  Discharge Condition: medically stable  Discharge Exam:  Physical Exam: General: Patient resting comfortably, in no acute distress. Cardiovascular: RRR, no murmurs or gallops auscultated Respiratory: CTAB, no wheezing, rales or rhonchi noted Abdomen: soft, nontender, nondistended, presence of bowel sounds Extremities: clean and dry dressing over the incision site of right femoral neck, no bleeding or drainage noted, distal pulses present bilaterally, no LE  edema noted bilaterally   Exam performed by Dr. Larae Grooms  Issues for Follow Up:  Discuss Tylenol allergy with patient -- willing to try taking it again  Please ensure patient is following up with ortho outpatient. Ensure that patient participating in PT as appropriate. Vitamin D level 31.68, consider supplementation if patient agreeable Continue Lovenox for DVT ppx for 6 weeks post-op  Significant Procedures: ORIF on 29/51 without complications   Significant Labs and Imaging:  Recent Labs  Lab 09/23/22 2156 09/25/22 0219  WBC 14.0* 11.5*  HGB 11.6* 10.6*  HCT 35.9* 31.4*  PLT 365 304   Recent Labs  Lab 09/23/22 2156 09/25/22 0219  NA 137 134*  K 4.2 4.2  CL 109 103  CO2 20* 20*  GLUCOSE 92 153*  BUN 12 12  CREATININE 0.92 1.08  CALCIUM 8.7* 8.3*     Results/Tests Pending at Time of Discharge: None  Discharge Medications:  Allergies as of 09/25/2022       Reactions   Sulfa Drugs Cross Reactors Other (See Comments)   Flu like symptoms   Acetaminophen Other (See Comments)   "Flu like symptoms"        Medication List     STOP taking these medications    ondansetron 4 MG tablet Commonly known as: Zofran       TAKE these medications    Advair Diskus 500-50 MCG/ACT Aepb Generic drug: fluticasone-salmeterol INHALE 1 PUFF INTO THE LUNGS TWICE A DAY What changed: See the new instructions.   albuterol 108 (90 Base) MCG/ACT inhaler Commonly known as: VENTOLIN HFA TAKE 2 PUFFS BY MOUTH EVERY 6 HOURS AS NEEDED FOR WHEEZE OR SHORTNESS OF BREATH What changed: See the new instructions.   atorvastatin 10 MG tablet Commonly known as: LIPITOR Take 1 tablet (10 mg total) by  mouth daily.   Dupixent 300 MG/2ML prefilled syringe Generic drug: dupilumab Inject 300 mg into the skin every 14 (fourteen) days. Every other Monday   enoxaparin 40 MG/0.4ML injection Commonly known as: LOVENOX Inject 0.4 mLs (40 mg total) into the skin daily.   finasteride 5 MG  tablet Commonly known as: PROSCAR Take 5 mg by mouth daily.   fluticasone 50 MCG/ACT nasal spray Commonly known as: FLONASE SPRAY 2 SPRAYS INTO EACH NOSTRIL EVERY DAY What changed: See the new instructions.   losartan 50 MG tablet Commonly known as: COZAAR Take 50 mg by mouth daily.   oxyCODONE 5 MG immediate release tablet Commonly known as: Oxy IR/ROXICODONE Take 1 tablet (5 mg total) by mouth every 4 (four) hours as needed for up to 5 days for moderate pain.   pantoprazole 20 MG tablet Commonly known as: PROTONIX TAKE 1 TABLET BY MOUTH EVERY DAY   polyethylene glycol 17 g packet Commonly known as: MIRALAX / GLYCOLAX Take 17 g by mouth daily as needed (constipation).   predniSONE 1 MG tablet Commonly known as: DELTASONE Take 3 tablets (3 mg total) by mouth daily with breakfast.   Prolia 60 MG/ML Sosy injection Generic drug: denosumab Inject 60 mg into the skin every 6 (six) months. Deliver to clinic: [address] [place], [place], [place] [address] by 04/08/22   senna 8.6 MG Tabs tablet Commonly known as: SENOKOT Take 1 tablet (8.6 mg total) by mouth daily as needed for mild constipation.               Durable Medical Equipment  (From admission, onward)           Start     Ordered   09/24/22 2149  For home use only DME Walker rolling  Once       Question Answer Comment  Walker: With [place]   Patient needs a walker to treat with the following condition Physical deconditioning      09/24/22 2148            Discharge Instructions: Please refer to Patient Instructions section of EMR for full details.  Patient was counseled important signs and symptoms that should prompt return to medical care, changes in medications, dietary instructions, activity restrictions, and follow up appointments.   Follow-Up Appointments:  Follow-up Information     Georgeanna Harrison, MD Follow up in 2 week(s).   Specialty: Orthopedic Surgery Contact information: [address] [place] [address] [phone]         Donney Dice, DO. Go on 09/29/2022.   Specialty: Family Medicine Why: Appointment at 10:30 am, please arrive at least 15 min prior to your scheduled appointment time. Contact information: [place] [address] [phone]                 Alcus Dad, MD 09/25/2022, 11:10 AM PGY-3, [place]

## 2022-09-25 NOTE — Progress Notes (Signed)
     Daily Progress Note Intern Pager: [phone]  Patient name: Brian Leon Medical record number: [id number] Date of birth: [birth year] Age: 76 y.o. Gender: male  Primary Care Provider: Arlyce Dice, MD Consultants: Orthopedics  Code Status: Full code  Pt Overview and Major Events to Date:  12/23: Admitted  Assessment and Plan: Brian Leon is a 76 year old male presenting with a right hip fracture in the setting of a mechanical fall. Pertinent PMH/PSH includes osteoporosis on Prolia, polymyalgia rheumatica, asthma and eczema.  * Femur fracture, right ([place]) Right femoral neck fracture, now post op day 1 and doing well. PT recommended HHPT and rolling walker.  -ortho following, appreciate final recs prior to likely discharge today -HHPT and DME ordered per PT recs -pending Vit D -Pain regimen: oxycodone 2.5-'5mg'$  q4hrs prn for moderate pain, morphine 1-2 mg q 4hrs prn for severe pain   - avoid NSAIDs for surgery, avoid tylenol due to side effects/patient request -Zofran '4mg'$  q8hrs prn for nausea  -ortho outpatient follow up in 2 weeks   Chronic and stable conditions Polymyalgia rheumatica: continue home prednisone daily Hyperlipidemia: continue home atorvastatin daily GERD: continue protonix daily Asthma: continue dulera, flonase and albuterol as needed BPH: continue home finasteride daily    FEN/GI: regular diet  PPx: lovenox  Dispo:Home  today or tomorrow . Barriers include surgical recommendations and clinical improvement.   Subjective:  No acute overnight events reported. Patient reports feeling well today and would like to go home. Denies any pain but states that the minimal pain that he does occasionally experience is well controlled under current pain regimen. He has not passed flatus yet but states that this is normal for him as he sometimes gets constipated.   Objective: Temp:  [97.5 F (36.4 C)-98.4 F (36.9 C)] 98.4 F (36.9 C) (12/24 0022) Pulse Rate:  [58-105]  68 (12/24 0022) Resp:  [14-18] 16 (12/23 2057) BP: (125-165)/(75-94) 134/75 (12/24 0022) SpO2:  [93 %-100 %] 98 % (12/24 0022) Weight:  [59.7 kg] 59.7 kg (12/23 1008) Physical Exam: General: Patient resting comfortably, in no acute distress. Cardiovascular: RRR, no murmurs or gallops auscultated Respiratory: CTAB, no wheezing, rales or rhonchi noted Abdomen: soft, nontender, nondistended, presence of bowel sounds Extremities: clean and dry dressing over the incision site of right femoral neck, no bleeding or drainage noted, distal pulses present bilaterally, no LE edema noted bilaterally   Laboratory: Most recent CBC Lab Results  Component Value Date   WBC 11.5 (H) 09/25/2022   HGB 10.6 (L) 09/25/2022   HCT 31.4 (L) 09/25/2022   MCV 98.7 09/25/2022   PLT 304 09/25/2022   Most recent BMP    Latest Ref Rng & Units 09/25/2022    2:19 AM  BMP  Glucose 70 - 99 mg/dL 153   BUN 8 - 23 mg/dL 12   Creatinine 0.61 - 1.24 mg/dL 1.08   Sodium 135 - 145 mmol/L 134   Potassium 3.5 - 5.1 mmol/L 4.2   Chloride 98 - 111 mmol/L 103   CO2 22 - 32 mmol/L 20   Calcium 8.9 - 10.3 mg/dL 8.3      Imaging/Diagnostic Tests: None  Donney Dice, DO 09/25/2022, 6:25 AM  PGY-3, [place] Intern pager: [phone], text pages welcome Secure chat group [place]

## 2022-09-25 NOTE — Progress Notes (Signed)
Physical Therapy Treatment Patient Details Name: Brian Leon MRN: [id number] DOB: 02-15-1946 Today's Date: 09/25/2022   History of Present Illness Admitted for r femur ORIF after a fall on 09/20/22. PMH significant for but not limited to: cervical fusion, Polymyalgia Rheumatica, BPH, asthma, HLD, GERD    PT Comments    Pt doing excellent from a mobility standpoint and he feels ready for DC today. Able to complete stair training without difficulty.  I have encouraged the patient to gradually increase activity daily to tolerance.   I have answered all patient's question regarding PT and mobility.     He reports he is going to borrow a RW   Recommendations for follow up therapy are one component of a multi-disciplinary discharge planning process, led by the attending physician.  Recommendations may be updated based on patient status, additional functional criteria and insurance authorization.  Follow Up Recommendations  Home health PT     Assistance Recommended at Discharge Set up Supervision/Assistance  Patient can return home with the following A little help with walking and/or transfers;Help with stairs or ramp for entrance   Equipment Recommendations  Other (comment) (Pt states he is borrowing a RW)    Recommendations for Other Services       Precautions / Restrictions Precautions Precautions: Fall Restrictions Weight Bearing Restrictions: Yes RLE Weight Bearing: Weight bearing as tolerated     Mobility  Bed Mobility Overal bed mobility: Modified Independent             General bed mobility comments: Able to get RLE out of bed without UEs today    Transfers Overall transfer level: Needs assistance Equipment used: Rolling walker (2 wheels) Transfers: Sit to/from Stand Sit to Stand: Supervision                Ambulation/Gait Ambulation/Gait assistance: Supervision Gait Distance (Feet): 20 Feet Assistive device: Rolling walker (2 wheels) Gait  Pattern/deviations: Step-to pattern, Antalg[place]       General Gait Details: limited distance today as gaol was mainly stair training and to save energy for him going home today   Stairs Stairs: Yes Stairs assistance: Min guard Stair Management: One rail Right, Step to pattern, Forwards Number of Stairs: 6 General stair comments: VCs for correct sequencing   Wheelchair Mobility    Modified Rankin (Stroke Patients Only)       Balance Overall balance assessment: History of Falls                                          Cognition Arousal/Alertness: Awake/alert Behavior During Therapy: WFL for tasks assessed/performed Overall Cognitive Status: Within Functional Limits for tasks assessed                                          Exercises Total Joint Exercises Hip ABduction/ADduction: PROM, Right, 5 reps, Standing Long Arc Quad: AROM, Right, 5 reps, Seated Marching in Standing: AROM, Right, 5 reps, Standing Standing Hip Extension: AROM, Right, 5 reps, Standing    General Comments        Pertinent Vitals/Pain Pain Assessment Pain Assessment: 0-10 Pain Score: 2  Pain Location: RLE Pain Intervention(s): Limited activity within patient's tolerance, Monitored during session    Home Living  Prior Function            PT Goals (current goals can now be found in the care plan section) Progress towards PT goals: Progressing toward goals    Frequency    Min 5X/week      PT Plan Current plan remains appropriate    Co-evaluation              AM-PAC PT "6 Clicks" Mobility   Outcome Measure  Help needed turning from your back to your side while in a flat bed without using bedrails?: None Help needed moving from lying on your back to sitting on the side of a flat bed without using bedrails?: None Help needed moving to and from a bed to a chair (including a wheelchair)?: A Little Help  needed standing up from a chair using your arms (e.g., wheelchair or bedside chair)?: A Little Help needed to walk in hospital room?: A Little Help needed climbing 3-5 steps with a railing? : A Little 6 Click Score: 20    End of Session Equipment Utilized During Treatment: Gait belt Activity Tolerance: Patient tolerated treatment well Patient left: in chair;with call bell/phone within reach Nurse Communication: Mobility status;Other (comment) (Acute PT goals met) PT Visit Diagnosis: Unsteadiness on feet (R26.81)     Time: 5885-0277 PT Time Calculation (min) (ACUTE ONLY): 27 min  Charges:  $Gait Training: 8-22 mins $Therapeutic Exercise: 8-22 mins                     Lavonia Dana, PT   Acute Rehabilitation Services  Office [phone] 09/25/2022    Melvern Banker 09/25/2022, 9:32 AM

## 2022-09-25 NOTE — Care Plan (Signed)
Per order, discharged patient home.  Discontinued INT, no bleeding noted.  Educated on medications, s/s of infection, follow up appts.  Patient verbalized understanding, denies additional needs.  Pt wheelchaired to personal vehicle by RN. Edythe Clarity, RN

## 2022-09-25 NOTE — Progress Notes (Addendum)
    Patient doing well PO day 1, minimal pain, eager to progress home. He has passed PT with flying colors and has been up and walking. He does not want much pain medication and wants a different blood thinner. He is not comfortable with Lovenox injection and price.    Physical Exam: Vitals:   09/25/22 0022 09/25/22 0630  BP: 134/75 127/80  Pulse: 68 63  Resp:  18  Temp: 98.4 F (36.9 C) 98.3 F (36.8 C)  SpO2: 98% 96%    Dressing in place, CDI, pt ambulating with walker comfortably, distal comp soft  NVI  POD #1 s/p R ORIF for femoral neck fx   - Cleared PT/OT, encourage ambulation WBAT - Will not accept percocet Rx, cannot tolerate codeine or tylenol, tramadol sent in instead - Dressing care: Keep AQUACEL on and dry for up to 14 days.  Do not allow surgical area to get wet before that.  Remove AQUACEL dressing after 14 days and allow area to get wet in shower but DO NOT SUBMERGE until wound is evaluated in clinic.  In most cases skin glue is used and no additional dressing is necessary.  - DVT prophylaxis, pt deferred Lovenox, wants ASA, will take 325 ASA BID - D/c home today with f/u in 2 weeks - Follow-up: Please call [place] [facility] [phone]) to schedule follow-op appointment for 2 weeks after surgery.

## 2022-09-27 ENCOUNTER — Encounter (HOSPITAL_COMMUNITY): Payer: Self-pay | Admitting: Orthopedic Surgery

## 2022-09-27 ENCOUNTER — Telehealth: Payer: Self-pay | Admitting: *Deleted

## 2022-09-27 NOTE — Telephone Encounter (Signed)
Transition Care Management Unsuccessful Follow-up Telephone Call  Date of discharge and from where:  09/25/2022  Attempts:  1st Attempt  Reason for unsuccessful TCM follow-up call:  Left voice message

## 2022-09-28 ENCOUNTER — Other Ambulatory Visit: Payer: Self-pay

## 2022-09-28 MED ORDER — PANTOPRAZOLE SODIUM 20 MG PO TBEC: 20.0000 mg | DELAYED_RELEASE_TABLET | Freq: Every day | ORAL | 1 refills | Status: DC

## 2022-09-29 ENCOUNTER — Ambulatory Visit (INDEPENDENT_AMBULATORY_CARE_PROVIDER_SITE_OTHER): Payer: Medicare (Managed Care) | Admitting: Family Medicine

## 2022-09-29 ENCOUNTER — Encounter: Payer: Self-pay | Admitting: Family Medicine

## 2022-09-29 VITALS — BP 142/64 | HR 56 | Ht 69.0 in | Wt 134.8 lb

## 2022-09-29 DIAGNOSIS — S72001A Fracture of unspecified part of neck of right femur, initial encounter for closed fracture: Secondary | ICD-10-CM

## 2022-09-29 NOTE — Progress Notes (Signed)
    SUBJECTIVE:   CHIEF COMPLAINT / HPI:   Patient presents for hospital follow up for right femur fracture, he is accompanied by his wife. He is s/p ORIF and continues to do well since his discharge from the hospital over the weekend. He was given a list of exercises which he does at home since his wife makes him do his PT daily. His pain is well-controlled with tramdaol and occasionally takes advil for the pain. He has been walking fine. He was using a walker when he left the hospital and today started using his cane since he feels he has improved. Both him and his wife deny any drainage or bleeding for the site other than 1 small spot with blood noted on his dressing but otherwise has been fine. He called the orthopedics office and has a follow up scheduled for 1/5. Ortho says that he can have aspirin instead of the lovenox for DVT prophylaxis for 6 weeks, he has been compliant on aspirin twice daily. He confirms that he has an allergy to tylenol and wants to continue to stay away from tylenol.   OBJECTIVE:   BP (!) 142/64   Pulse (!) 56   Ht '5\' 9"'$  (1.753 m)   Wt 134 lb 12.8 oz (61.1 kg)   SpO2 100%   BMI 19.91 kg/m   General: Patient well-appearing, in no acute distress. CV: RRR, no murmurs or gallops auscultated Resp: CTAB, no wheezing, rales or rhonchi noted Ext: right femur with clean and dry stressing with minimal bleeding and no drainage, minimal surrounding erythema without edema or tenderness to palpation Neuro: ambulates with assistance of cane, no pain with gait  Psych: mood appropriate, pleasant  ASSESSMENT/PLAN:   Femur fracture, right ([place]) -doing well post op, pain well-controlled with tramadol -encouraged continued gradual improvement of activity and continued participation in PT -continue aspirin for DVT ppx per ortho -ortho follow up scheduled for 1/5, discussed importance of ortho outpatient follow up -follow up with PCP as appropriate    -PHQ-9 score of 0  reviewed.   Donney Dice, [place]

## 2022-09-29 NOTE — Patient Instructions (Addendum)
It was great seeing you today!  I am glad that you are doing well after your recent hospital stay. Your hip looks great, make sure to keep that area dry when showering. Please continue to take your aspirin twice daily as directed by the orthopedic specialist. Continue to do physical therapy to regain your strength. Please follow up with orthopedics on 10/07/2022.   Please follow up with Dr. Markus Jarvis at your earliest convenience for health maintenance.   Please follow up at your next scheduled appointment, if anything arises between now and then, please don't hesitate to contact our office.   Thank you for allowing [place] to be a part of your medical care!  Thank you, Dr. Larae Grooms  Also a reminder of our clinic's no-show policy. Please make sure to arrive at least 15 minutes prior to your scheduled appointment time. Please try to cancel before 24 hours if you are not able to make it. If you no-show for 2 appointments then you will be receiving a warning letter. If you no-show after 3 visits, then you may be at risk of being dismissed from our clinic. This is to ensure that everyone is able to be seen in a timely manner. Thank you, we appreciate your assistance with this!

## 2022-09-29 NOTE — Assessment & Plan Note (Signed)
-  doing well post op, pain well-controlled with tramadol -encouraged continued gradual improvement of activity and continued participation in PT -continue aspirin for DVT ppx per ortho -ortho follow up scheduled for 1/5, discussed importance of ortho outpatient follow up -follow up with PCP as appropriate

## 2022-10-07 DIAGNOSIS — S72044A Nondisplaced fracture of base of neck of right femur, initial encounter for closed fracture: Secondary | ICD-10-CM | POA: Diagnosis not present

## 2022-10-10 ENCOUNTER — Other Ambulatory Visit (HOSPITAL_COMMUNITY): Payer: Self-pay

## 2022-10-10 ENCOUNTER — Telehealth: Payer: Self-pay | Admitting: Cardiology

## 2022-10-10 MED ORDER — LOSARTAN POTASSIUM 50 MG PO TABS: 50.0000 mg | ORAL_TABLET | Freq: Every day | ORAL | 1 refills | Status: DC

## 2022-10-10 NOTE — Telephone Encounter (Signed)
Refill of Losartan 50 mg sent to [facility] on [address], [place].

## 2022-10-10 NOTE — Telephone Encounter (Signed)
*  STAT* If patient is at the pharmacy, call can be transferred to refill team.   1. Which medications need to be refilled? (please list name of each medication and dose if known)  losartan (COZAAR) 50 MG tablet  2. Which pharmacy/location (including street and city if local pharmacy) is medication to be sent to? [id number] [place], [place] - [address]  3. Do they need a 30 day or 90 day supply?  90 day supply
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
## 2022-11-01 ENCOUNTER — Ambulatory Visit: Payer: Medicare HMO | Attending: Physician Assistant | Admitting: Physician Assistant

## 2022-11-01 ENCOUNTER — Other Ambulatory Visit (HOSPITAL_COMMUNITY): Payer: Self-pay

## 2022-11-01 ENCOUNTER — Telehealth: Payer: Self-pay | Admitting: Pharmacist

## 2022-11-01 ENCOUNTER — Encounter: Payer: Self-pay | Admitting: Physician Assistant

## 2022-11-01 VITALS — BP 159/77 | HR 65 | Resp 15 | Ht 69.5 in | Wt 137.0 lb

## 2022-11-01 DIAGNOSIS — M19041 Primary osteoarthritis, right hand: Secondary | ICD-10-CM

## 2022-11-01 DIAGNOSIS — E559 Vitamin D deficiency, unspecified: Secondary | ICD-10-CM

## 2022-11-01 DIAGNOSIS — M810 Age-related osteoporosis without current pathological fracture: Secondary | ICD-10-CM

## 2022-11-01 DIAGNOSIS — M5030 Other cervical disc degeneration, unspecified cervical region: Secondary | ICD-10-CM

## 2022-11-01 DIAGNOSIS — M19042 Primary osteoarthritis, left hand: Secondary | ICD-10-CM

## 2022-11-01 DIAGNOSIS — M353 Polymyalgia rheumatica: Secondary | ICD-10-CM | POA: Diagnosis not present

## 2022-11-01 DIAGNOSIS — A498 Other bacterial infections of unspecified site: Secondary | ICD-10-CM

## 2022-11-01 DIAGNOSIS — Z872 Personal history of diseases of the skin and subcutaneous tissue: Secondary | ICD-10-CM

## 2022-11-01 DIAGNOSIS — I10 Essential (primary) hypertension: Secondary | ICD-10-CM | POA: Diagnosis not present

## 2022-11-01 DIAGNOSIS — Z862 Personal history of diseases of the blood and blood-forming organs and certain disorders involving the immune mechanism: Secondary | ICD-10-CM

## 2022-11-01 DIAGNOSIS — Z7952 Long term (current) use of systemic steroids: Secondary | ICD-10-CM | POA: Diagnosis not present

## 2022-11-01 DIAGNOSIS — R911 Solitary pulmonary nodule: Secondary | ICD-10-CM

## 2022-11-01 DIAGNOSIS — Z9889 Other specified postprocedural states: Secondary | ICD-10-CM | POA: Diagnosis not present

## 2022-11-01 DIAGNOSIS — N132 Hydronephrosis with renal and ureteral calculous obstruction: Secondary | ICD-10-CM

## 2022-11-01 DIAGNOSIS — Z970 Presence of artificial eye: Secondary | ICD-10-CM

## 2022-11-01 MED ORDER — DENOSUMAB 60 MG/ML ~~LOC~~ SOSY
60.0000 mg | PREFILLED_SYRINGE | SUBCUTANEOUS | 0 refills | Status: DC
  Filled 2022-11-01: qty 1, 180d supply, fill #0

## 2022-11-01 NOTE — Telephone Encounter (Signed)
Patient due for Prolia on 10/31/2022. Will need repeat calcium level. Rx sent to [facility] to be couriered to clinic by 11/09/2022 (per test claim, copay is $11.25)  [name], PharmD, MPH, BCPS, CPP Clinical Pharmacist (Rheumatology and Pulmonology)

## 2022-11-02 ENCOUNTER — Other Ambulatory Visit: Payer: Self-pay

## 2022-11-02 LAB — BASIC METABOLIC PANEL WITH GFR
BUN: 15 mg/dL (ref 7–25)
CO2: 27 mmol/L (ref 20–32)
Calcium: 8.9 mg/dL (ref 8.6–10.3)
Chloride: 108 mmol/L (ref 98–110)
Creat: 1.04 mg/dL (ref 0.70–1.28)
Glucose, Bld: 84 mg/dL (ref 65–99)
Potassium: 4.5 mmol/L (ref 3.5–5.3)
Sodium: 140 mmol/L (ref 135–146)
eGFR: 73 mL/min/{1.73_m2} (ref >=60)

## 2022-11-02 NOTE — Progress Notes (Signed)
BMP WNL.  Calcium has returned to WNL.

## 2022-11-03 ENCOUNTER — Other Ambulatory Visit (HOSPITAL_COMMUNITY): Payer: Self-pay

## 2022-11-03 NOTE — Telephone Encounter (Signed)
Patient scheduled for Prolia on 11/08/2022  [name], PharmD, MPH, BCPS, CPP Clinical Pharmacist (Rheumatology and Pulmonology)

## 2022-11-04 DIAGNOSIS — S72044A Nondisplaced fracture of base of neck of right femur, initial encounter for closed fracture: Secondary | ICD-10-CM | POA: Diagnosis not present

## 2022-11-08 ENCOUNTER — Other Ambulatory Visit (HOSPITAL_COMMUNITY): Payer: Self-pay

## 2022-11-08 ENCOUNTER — Ambulatory Visit: Payer: Medicare HMO | Admitting: Pharmacist

## 2022-11-08 NOTE — Telephone Encounter (Signed)
Prolia was not received from pharmacy. Unable to determine status. Pharmacy will re-ship tomorrow. Will reach out to pt once medication is physically onsite at clinic  [name], PharmD, MPH, BCPS, CPP Clinical Pharmacist (Rheumatology and Pulmonology)

## 2022-11-09 ENCOUNTER — Other Ambulatory Visit: Payer: Self-pay | Admitting: Cardiology

## 2022-11-10 ENCOUNTER — Other Ambulatory Visit (HOSPITAL_COMMUNITY): Payer: Self-pay

## 2022-11-10 NOTE — Telephone Encounter (Addendum)
Prolia received from [facility]. Placed in [place]. Pt r/s for Prolia appt on 11/14/2022  [name], PharmD, MPH, BCPS, CPP Clinical Pharmacist (Rheumatology and Pulmonology)

## 2022-11-14 ENCOUNTER — Other Ambulatory Visit: Payer: Self-pay

## 2022-11-14 ENCOUNTER — Ambulatory Visit: Payer: PRIVATE HEALTH INSURANCE | Admitting: Pharmacist

## 2022-11-14 NOTE — Progress Notes (Deleted)
Pharmacy Note  Subjective:   Patient presents to clinic today to receive bi-annual dose of Prolia. Patient's last dose of Prolia was on 05/04/2022  Patient running a fever or have signs/symptoms of infection? {yes/no:20286}  Patient currently on antibiotics for the treatment of infection? {yes/no:20286}  Patient had fall in the last 6 months?  Yes  If yes, did it require medical attention? Yes . He had a fall on 09/22/2022 - fractured right femoral neck. Underwent open reduction internal fixation on 09/24/2022.  Patient taking calcium 1200 mg daily through diet or supplement and at least 800 units vitamin D? {yes/no:20286}  Objective: CMP     Component Value Date/Time   NA 140 11/01/2022 1023   NA 139 05/19/2022 1150   K 4.5 11/01/2022 1023   CL 108 11/01/2022 1023   CO2 27 11/01/2022 1023   GLUCOSE 84 11/01/2022 1023   BUN 15 11/01/2022 1023   BUN 11 05/19/2022 1150   CREATININE 1.04 11/01/2022 1023   CALCIUM 8.9 11/01/2022 1023   PROT 6.3 03/31/2022 1344   ALBUMIN 3.8 03/31/2022 1344   AST 14 03/31/2022 1344   ALT 14 03/31/2022 1344   ALKPHOS 85 03/31/2022 1344   BILITOT 0.4 03/31/2022 1344   GFRNONAA >60 09/25/2022 0219   GFRNONAA 67 03/17/2021 1059   GFRAA 77 03/17/2021 1059    CBC    Component Value Date/Time   WBC 11.5 (H) 09/25/2022 0219   RBC 3.18 (L) 09/25/2022 0219   HGB 10.6 (L) 09/25/2022 0219   HGB 11.9 (L) 03/31/2022 1344   HCT 31.4 (L) 09/25/2022 0219   HCT 34.9 (L) 03/31/2022 1344   PLT 304 09/25/2022 0219   PLT 418 03/31/2022 1344   MCV 98.7 09/25/2022 0219   MCV 98 (H) 03/31/2022 1344   MCH 33.3 09/25/2022 0219   MCHC 33.8 09/25/2022 0219   RDW 13.5 09/25/2022 0219   RDW 12.1 03/31/2022 1344   LYMPHSABS 1.5 09/23/2022 2156   LYMPHSABS 2.2 03/31/2022 1344   MONOABS 1.0 09/23/2022 2156   EOSABS 0.4 09/23/2022 2156   EOSABS 0.4 03/31/2022 1344   BASOSABS 0.1 09/23/2022 2156   BASOSABS 0.1 03/31/2022 1344    Lab Results  Component Value  Date   VD25OH 31.68 09/25/2022    T-score (02/04/21): The BMD measured at Femur Neck Right is 0.645 g/cm2 with a T-score of -2.8.   Assessment/Plan:   Reviewed importance of adequate dietary intake of calcium in addition to supplementation due to risk of hypocalcemia with Prolia.   Patient tolerated injection  ***.   Patient's next Prolia dose is due on 05/13/2023.  Patient is due for updated DEXA in May 2024.   All questions encouraged and answered.  Instructed patient to call with any further questions or concerns.   [name], PharmD, MPH, BCPS, CPP Clinical Pharmacist (Rheumatology and Pulmonology)

## 2022-11-15 ENCOUNTER — Telehealth: Payer: Self-pay | Admitting: Rheumatology

## 2022-11-15 NOTE — Telephone Encounter (Signed)
Patient called the office requesting to speak with [facility] regarding his Prolia injection.

## 2022-11-16 ENCOUNTER — Ambulatory Visit: Payer: PRIVATE HEALTH INSURANCE | Attending: Rheumatology | Admitting: Pharmacist

## 2022-11-16 DIAGNOSIS — M810 Age-related osteoporosis without current pathological fracture: Secondary | ICD-10-CM

## 2022-11-16 MED ORDER — DENOSUMAB 60 MG/ML ~~LOC~~ SOSY
60.0000 mg | PREFILLED_SYRINGE | Freq: Once | SUBCUTANEOUS | Status: AC
  Administered 2022-11-16: 60 mg via SUBCUTANEOUS

## 2022-11-16 NOTE — Progress Notes (Deleted)
Pharmacy Note  Subjective:   Patient presents to clinic today to receive bi-annual dose of Prolia. Patient's last dose of Prolia was on 05/04/2022  Patient running a fever or have signs/symptoms of infection? {yes/no:20286}  Patient currently on antibiotics for the treatment of infection? {yes/no:20286}  Patient had fall in the last 6 months?  {yes/no:20286}  If yes, did it require medical attention? {yes/no:20286}   Patient taking calcium 1200 mg daily through diet or supplement and at least 800 units vitamin D? {yes/no:20286}  Objective: CMP     Component Value Date/Time   NA 140 11/01/2022 1023   NA 139 05/19/2022 1150   K 4.5 11/01/2022 1023   CL 108 11/01/2022 1023   CO2 27 11/01/2022 1023   GLUCOSE 84 11/01/2022 1023   BUN 15 11/01/2022 1023   BUN 11 05/19/2022 1150   CREATININE 1.04 11/01/2022 1023   CALCIUM 8.9 11/01/2022 1023   PROT 6.3 03/31/2022 1344   ALBUMIN 3.8 03/31/2022 1344   AST 14 03/31/2022 1344   ALT 14 03/31/2022 1344   ALKPHOS 85 03/31/2022 1344   BILITOT 0.4 03/31/2022 1344   GFRNONAA >60 09/25/2022 0219   GFRNONAA 67 03/17/2021 1059   GFRAA 77 03/17/2021 1059    CBC    Component Value Date/Time   WBC 11.5 (H) 09/25/2022 0219   RBC 3.18 (L) 09/25/2022 0219   HGB 10.6 (L) 09/25/2022 0219   HGB 11.9 (L) 03/31/2022 1344   HCT 31.4 (L) 09/25/2022 0219   HCT 34.9 (L) 03/31/2022 1344   PLT 304 09/25/2022 0219   PLT 418 03/31/2022 1344   MCV 98.7 09/25/2022 0219   MCV 98 (H) 03/31/2022 1344   MCH 33.3 09/25/2022 0219   MCHC 33.8 09/25/2022 0219   RDW 13.5 09/25/2022 0219   RDW 12.1 03/31/2022 1344   LYMPHSABS 1.5 09/23/2022 2156   LYMPHSABS 2.2 03/31/2022 1344   MONOABS 1.0 09/23/2022 2156   EOSABS 0.4 09/23/2022 2156   EOSABS 0.4 03/31/2022 1344   BASOSABS 0.1 09/23/2022 2156   BASOSABS 0.1 03/31/2022 1344    Lab Results  Component Value Date   VD25OH 31.68 09/25/2022    T-score(01/2021): The BMD measured at Femur Neck Right is 0.645  g/cm2 with a T-score of -2.8.  Assessment/Plan:   Reviewed importance of adequate dietary intake of calcium in addition to supplementation due to risk of hypocalcemia with Prolia.   Patient tolerated injection  ***.   Patient is to return in 10-14 days for labs to monitor for hypocalcemia.  Future orders placed.  Patient's next Prolia dose is due on 05/15/2023.  Patient is due for updated DEXA in 02/2023.   All questions encouraged and answered.  Instructed patient to call with any further questions or concerns.  Maryan Puls, PharmD PGY-1 [facility] Resident

## 2022-11-16 NOTE — Telephone Encounter (Signed)
Patient's Prolia r/s for 11/16/2022  [name], PharmD, MPH, BCPS, CPP Clinical Pharmacist (Rheumatology and Pulmonology)

## 2022-11-16 NOTE — Progress Notes (Signed)
Pharmacy Note  Subjective:   Patient presents to clinic today to receive bi-annual dose of Prolia. Patient's last dose of Prolia was on 05/04/2022  Patient running a fever or have signs/symptoms of infection? No  Patient currently on antibiotics for the treatment of infection? No  Patient had fall in the last 6 months?  Yes  If yes, did it require medical attention? Yes  He recently missed a step when walking down the stairs and fell on the concrete. He fractured his right femur.   Patient taking calcium 1200 mg daily through diet or supplement and at least 800 units vitamin D? Yes  Objective: CMP     Component Value Date/Time   NA 140 11/01/2022 1023   NA 139 05/19/2022 1150   K 4.5 11/01/2022 1023   CL 108 11/01/2022 1023   CO2 27 11/01/2022 1023   GLUCOSE 84 11/01/2022 1023   BUN 15 11/01/2022 1023   BUN 11 05/19/2022 1150   CREATININE 1.04 11/01/2022 1023   CALCIUM 8.9 11/01/2022 1023   PROT 6.3 03/31/2022 1344   ALBUMIN 3.8 03/31/2022 1344   AST 14 03/31/2022 1344   ALT 14 03/31/2022 1344   ALKPHOS 85 03/31/2022 1344   BILITOT 0.4 03/31/2022 1344   GFRNONAA >60 09/25/2022 0219   GFRNONAA 67 03/17/2021 1059   GFRAA 77 03/17/2021 1059    CBC    Component Value Date/Time   WBC 11.5 (H) 09/25/2022 0219   RBC 3.18 (L) 09/25/2022 0219   HGB 10.6 (L) 09/25/2022 0219   HGB 11.9 (L) 03/31/2022 1344   HCT 31.4 (L) 09/25/2022 0219   HCT 34.9 (L) 03/31/2022 1344   PLT 304 09/25/2022 0219   PLT 418 03/31/2022 1344   MCV 98.7 09/25/2022 0219   MCV 98 (H) 03/31/2022 1344   MCH 33.3 09/25/2022 0219   MCHC 33.8 09/25/2022 0219   RDW 13.5 09/25/2022 0219   RDW 12.1 03/31/2022 1344   LYMPHSABS 1.5 09/23/2022 2156   LYMPHSABS 2.2 03/31/2022 1344   MONOABS 1.0 09/23/2022 2156   EOSABS 0.4 09/23/2022 2156   EOSABS 0.4 03/31/2022 1344   BASOSABS 0.1 09/23/2022 2156   BASOSABS 0.1 03/31/2022 1344    Lab Results  Component Value Date   VD25OH 31.68 09/25/2022     T-score(01/2021): The BMD measured at Femur Neck Right is 0.645 g/cm2 with a T-score of -2.8.  Assessment/Plan:   Reviewed importance of adequate dietary intake of calcium in addition to supplementation due to risk of hypocalcemia with Prolia.   Patient tolerated injection without issue.   Administrations This Visit     denosumab (PROLIA) injection 60 mg     Admin Date 11/16/2022 Action Given Dose 60 mg Route Subcutaneous Administered By Maryan Puls, [place]            Patient's next Prolia dose is due on 05/15/2023.  Patient is due for updated DEXA in 02/2023. Patient instructed to call his PCP to get this scheduled.    All questions encouraged and answered.  Instructed patient to call with any further questions or concerns.  Maryan Puls, PharmD PGY-1 [facility] Pharmacy Resident

## 2022-11-28 ENCOUNTER — Other Ambulatory Visit (HOSPITAL_COMMUNITY): Payer: Self-pay

## 2022-12-07 ENCOUNTER — Other Ambulatory Visit (HOSPITAL_COMMUNITY): Payer: Self-pay

## 2022-12-13 ENCOUNTER — Telehealth: Payer: Self-pay | Admitting: Family Medicine

## 2022-12-13 NOTE — Telephone Encounter (Signed)
Called patient to schedule Medicare Annual Wellness Visit (AWV). Left message for patient to call back and schedule Medicare Annual Wellness Visit (AWV).  Last date of AWV: AWVI eligible as of 10/03/2009  Please schedule an appointment at any time with Nurse Health Advisor.  If any questions, please contact me at [phone].    Thank you,  Aneth Direct dial  [phone]

## 2022-12-14 ENCOUNTER — Other Ambulatory Visit (HOSPITAL_COMMUNITY): Payer: Self-pay

## 2022-12-26 ENCOUNTER — Other Ambulatory Visit: Payer: Self-pay | Admitting: *Deleted

## 2022-12-26 MED ORDER — PREDNISONE 1 MG PO TABS: 3.0000 mg | ORAL_TABLET | Freq: Every day | ORAL | 0 refills | Status: DC

## 2022-12-26 NOTE — Telephone Encounter (Signed)
Last Fill: 09/07/2022  Next Visit: 03/28/2023  Last Visit: 11/01/2022  Dx: Age-related osteoporosis without current pathological fracture   Current Dose per office note on 11/01/2022: prednisone 3 mg daily and is unable to taper   Okay to refill prednisone?

## 2023-01-11 ENCOUNTER — Other Ambulatory Visit (HOSPITAL_COMMUNITY): Payer: Self-pay

## 2023-01-12 ENCOUNTER — Other Ambulatory Visit (HOSPITAL_COMMUNITY): Payer: Self-pay

## 2023-01-13 ENCOUNTER — Other Ambulatory Visit (HOSPITAL_COMMUNITY): Payer: Self-pay

## 2023-01-17 ENCOUNTER — Other Ambulatory Visit (HOSPITAL_COMMUNITY): Payer: Self-pay

## 2023-01-18 ENCOUNTER — Other Ambulatory Visit: Payer: Self-pay | Admitting: Family Medicine

## 2023-01-18 DIAGNOSIS — R058 Other specified cough: Secondary | ICD-10-CM

## 2023-02-07 ENCOUNTER — Telehealth: Payer: Self-pay | Admitting: Family Medicine

## 2023-02-07 NOTE — Telephone Encounter (Signed)
Contacted Kouper P Haros to schedule their annual wellness visit. Appointment made for 02/09/2023.  Thank you,  [facility] Support [facility] Direct dial  [phone]

## 2023-02-08 ENCOUNTER — Other Ambulatory Visit: Payer: Self-pay | Admitting: Cardiology

## 2023-02-09 ENCOUNTER — Ambulatory Visit (INDEPENDENT_AMBULATORY_CARE_PROVIDER_SITE_OTHER): Payer: Medicare HMO

## 2023-02-09 ENCOUNTER — Telehealth: Payer: Self-pay

## 2023-02-09 DIAGNOSIS — M810 Age-related osteoporosis without current pathological fracture: Secondary | ICD-10-CM

## 2023-02-09 DIAGNOSIS — Z0000 Encounter for general adult medical examination without abnormal findings: Secondary | ICD-10-CM | POA: Diagnosis not present
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
## 2023-02-09 NOTE — Patient Instructions (Signed)
Health Maintenance, Male Adopting a healthy lifestyle and getting preventive care are important in promoting health and wellness. Ask your health care provider about: The right schedule for you to have regular tests and exams. Things you can do on your own to prevent diseases and keep yourself healthy. What should I know about diet, weight, and exercise? Eat a healthy diet  Eat a diet that includes plenty of vegetables, fruits, low-fat dairy products, and lean protein. Do not eat a lot of foods that are high in solid fats, added sugars, or sodium. Maintain a healthy weight Body mass index (BMI) is a measurement that can be used to identify possible weight problems. It estimates body fat based on height and weight. Your health care provider can help determine your BMI and help you achieve or maintain a healthy weight. Get regular exercise Get regular exercise. This is one of the most important things you can do for your health. Most adults should: Exercise for at least 150 minutes each week. The exercise should increase your heart rate and make you sweat (moderate-intensity exercise). Do strengthening exercises at least twice a week. This is in addition to the moderate-intensity exercise. Spend less time sitting. Even light physical activity can be beneficial. Watch cholesterol and blood lipids Have your blood tested for lipids and cholesterol at 79 years of age, then have this test every 5 years. You may need to have your cholesterol levels checked more often if: Your lipid or cholesterol levels are high. You are older than 79 years of age. You are at high risk for heart disease. What should I know about cancer screening? Many types of cancers can be detected early and may often be prevented. Depending on your health history and family history, you may need to have cancer screening at various ages. This may include screening for: Colorectal cancer. Prostate cancer. Skin cancer. Lung  cancer. What should I know about heart disease, diabetes, and high blood pressure? Blood pressure and heart disease High blood pressure causes heart disease and increases the risk of stroke. This is more likely to develop in people who have high blood pressure readings or are overweight. Talk with your health care provider about your target blood pressure readings. Have your blood pressure checked: Every 3-5 years if you are 18-39 years of age. Every year if you are 40 years old or older. If you are between the ages of 65 and 75 and are a current or former smoker, ask your health care provider if you should have a one-time screening for abdominal aortic aneurysm (AAA). Diabetes Have regular diabetes screenings. This checks your fasting blood sugar level. Have the screening done: Once every three years after age 45 if you are at a normal weight and have a low risk for diabetes. More often and at a younger age if you are overweight or have a high risk for diabetes. What should I know about preventing infection? Hepatitis B If you have a higher risk for hepatitis B, you should be screened for this virus. Talk with your health care provider to find out if you are at risk for hepatitis B infection. Hepatitis C Blood testing is recommended for: Everyone born from 1945 through 1965. Anyone with known risk factors for hepatitis C. Sexually transmitted infections (STIs) You should be screened each year for STIs, including gonorrhea and chlamydia, if: You are sexually active and are younger than 79 years of age. You are older than 79 years of age and your   health care provider tells you that you are at risk for this type of infection. Your sexual activity has changed since you were last screened, and you are at increased risk for chlamydia or gonorrhea. Ask your health care provider if you are at risk. Ask your health care provider about whether you are at high risk for HIV. Your health care provider  may recommend a prescription medicine to help prevent HIV infection. If you choose to take medicine to prevent HIV, you should first get tested for HIV. You should then be tested every 3 months for as long as you are taking the medicine. Follow these instructions at home: Alcohol use Do not drink alcohol if your health care provider tells you not to drink. If you drink alcohol: Limit how much you have to 0-2 drinks a day. Know how much alcohol is in your drink. In the [place] one drink equals one 12 oz bottle of beer (355 mL), one 5 oz glass of wine (148 mL), or one 1 oz glass of hard liquor (44 mL). Lifestyle Do not use any products that contain nicotine or tobacco. These products include cigarettes, chewing tobacco, and vaping devices, such as e-cigarettes. If you need help quitting, ask your health care provider. Do not use street drugs. Do not share needles. Ask your health care provider for help if you need support or information about quitting drugs. General instructions Schedule regular health, dental, and eye exams. Stay current with your vaccines. Tell your health care provider if: You often feel depressed. You have ever been abused or do not feel safe at home. Summary Adopting a healthy lifestyle and getting preventive care are important in promoting health and wellness. Follow your health care provider's instructions about healthy diet, exercising, and getting tested or screened for diseases. Follow your health care provider's instructions on monitoring your cholesterol and blood pressure. This information is not intended to replace advice given to you by your health care provider. Make sure you discuss any questions you have with your health care provider. Document Revised: 02/08/2021 Document Reviewed: 02/08/2021 Elsevier Patient Education  2023 Elsevier Inc.  

## 2023-02-09 NOTE — Telephone Encounter (Signed)
Notified by CMA Laurel Dimmer that pt had his AWV. During this visit, he requested DEXA scan for monitoring of osteoporosis. He reports that his rheumatologist instructed him to have his PCP order. Per chart review of Rheum notes, pt is due for a DEXA May 2024. I do not see a DEXA scan ordered yet, so I will place the order now.

## 2023-03-10 ENCOUNTER — Other Ambulatory Visit: Payer: Self-pay | Admitting: Family Medicine

## 2023-03-10 ENCOUNTER — Telehealth: Payer: Self-pay

## 2023-03-10 ENCOUNTER — Ambulatory Visit (INDEPENDENT_AMBULATORY_CARE_PROVIDER_SITE_OTHER): Payer: Medicare HMO | Admitting: Student

## 2023-03-10 ENCOUNTER — Ambulatory Visit
Admission: RE | Admit: 2023-03-10 | Discharge: 2023-03-10 | Disposition: A | Discharge status: Home / Self Care | Payer: Medicare HMO | Source: Ambulatory Visit | Attending: Family Medicine | Admitting: Family Medicine

## 2023-03-10 VITALS — BP 101/66 | HR 100 | Temp 100.0°F | Resp 19 | Ht 69.5 in | Wt 131.6 lb

## 2023-03-10 DIAGNOSIS — J189 Pneumonia, unspecified organism: Secondary | ICD-10-CM

## 2023-03-10 DIAGNOSIS — R0602 Shortness of breath: Secondary | ICD-10-CM | POA: Diagnosis not present

## 2023-03-10 DIAGNOSIS — R059 Cough, unspecified: Secondary | ICD-10-CM | POA: Diagnosis not present

## 2023-03-10 DIAGNOSIS — J90 Pleural effusion, not elsewhere classified: Secondary | ICD-10-CM | POA: Diagnosis not present

## 2023-03-10 DIAGNOSIS — R12 Heartburn: Secondary | ICD-10-CM

## 2023-03-10 MED ORDER — DOXYCYCLINE HYCLATE 100 MG PO TABS: 100.0000 mg | ORAL_TABLET | Freq: Two times a day (BID) | ORAL | 0 refills | Status: DC

## 2023-03-10 MED ORDER — AMOXICILLIN-POT CLAVULANATE 875-125 MG PO TABS: 1.0000 | ORAL_TABLET | Freq: Two times a day (BID) | ORAL | 0 refills | Status: DC

## 2023-03-10 NOTE — Telephone Encounter (Signed)
Patient calls nurse line regarding persistent cough. He states that he tested positive for COVID about 12-13 days ago.   Reports that he took COVID test this morning and received negative results. He is concerned as cough has been persistent and he continues to have chills if he is not taking Advil.   He has a history of asthma and is concerned that he has possibly developed pneumonia.   Advised that he would need further evaluation for this concern.   Scheduled for this afternoon in ATC.   Veronda Prude, RN

## 2023-03-10 NOTE — Progress Notes (Unsigned)
    SUBJECTIVE:   CHIEF COMPLAINT / HPI:   Sick symptoms Patient complains of continued cough and chills after testing positive for COVID almost 2 weeks ago. Hasn't checked temp but needed to use covers in [place] (just got back from "vacation" early this morning).  Admits to SOB only with exertion. Cough is just now becoming productive with gray sputum. Admits to intermittent bilateral lower chest pain for past 2 weeks when infection began. Has decreased appetite but still able to eat and drink. No nausea, vomiting, diarrhea.   PERTINENT  PMH / PSH: Asthma  OBJECTIVE:   BP 101/66 (BP Location: Left Arm, Patient Position: Sitting, Cuff Size: Small)   Pulse 100   Temp 100 F (37.8 C) (Oral)   Resp 19   Ht 5' 9.5" (1.765 m)   Wt 131 lb 9.6 oz (59.7 kg)   SpO2 98%   BMI 19.16 kg/m    General: NAD, pleasant, able to participate in exam Cardiac: RRR, no murmurs. Respiratory: CTAB, normal effort, No wheezes, rales or rhonchi. Crackles at left lower lobe  Abdomen: Bowel sounds present, nontender, nondistended, no hepatosplenomegaly. Extremities: no edema or cyanosis. Skin: warm and dry, no rashes noted Neuro: alert, no obvious focal deficits Psych: Normal affect and mood  ASSESSMENT/PLAN:   No problem-specific Assessment & Plan notes found for this encounter.     Dr. Erick Alley, DO [facility]    {    This will disappear when note is signed, click to select method of visit    :1}

## 2023-03-10 NOTE — Patient Instructions (Addendum)
It was great to see you! Thank you for allowing me to participate in your care!  I recommend that you always bring your medications to each appointment as this makes it easy to ensure you are on the correct medications and helps [place] not miss when refills are needed.  Our plans for today:  -I sent in a prescription for 2 antibiotics, doxycycline and Augmentin.  Take both twice daily with food for 5 days -If your symptoms do not significantly improve over the course of antibiotics, please return as you may need an extended course -Reasons to go to the emergency department include increased shortness of breath, feeling like he may pass out, feeling like you are unable to drink enough to stay hydrated -I ordered a chest x-ray at [facility], [address] You can get this done at your convenience -You can take Tylenol as needed for pain and fever   Take care and seek immediate care sooner if you develop any concerns.   Dr. Erick Alley, DO [facility]

## 2023-03-12 DIAGNOSIS — J189 Pneumonia, unspecified organism: Secondary | ICD-10-CM | POA: Insufficient documentation

## 2023-03-12 NOTE — Assessment & Plan Note (Signed)
History and exam concerning for secondary bacterial pneumonia after COVID-19 infection.  Reassuringly, patient is breathing comfortably on room air with good oxygen saturation.   -CXR -Treatment with doxycycline and Augmentin -ED precautions given

## 2023-03-13 ENCOUNTER — Telehealth: Payer: Self-pay

## 2023-03-13 ENCOUNTER — Telehealth: Payer: Self-pay | Admitting: Student

## 2023-03-13 DIAGNOSIS — J189 Pneumonia, unspecified organism: Secondary | ICD-10-CM

## 2023-03-13 MED ORDER — AMOXICILLIN-POT CLAVULANATE 875-125 MG PO TABS
1.0000 | ORAL_TABLET | Freq: Two times a day (BID) | ORAL | 0 refills | Status: AC
Start: 2023-03-13 — End: 2023-03-18

## 2023-03-13 MED ORDER — DOXYCYCLINE HYCLATE 100 MG PO TABS
100.0000 mg | ORAL_TABLET | Freq: Two times a day (BID) | ORAL | 0 refills | Status: DC
Start: 2023-03-13 — End: 2023-05-08

## 2023-03-13 NOTE — Telephone Encounter (Signed)
Called pt who states he doesn't feel much better since started abx three days ago   Started having diarrhea on Saturday, still having it now but only 3x a day.

## 2023-03-13 NOTE — Telephone Encounter (Signed)
Patient returns call to nurse line.   He states that he has been taking antibiotics since Friday. He reports continued fever and chills. Fever yesterday 101.4. He has been taking Advil to treat fever.   He states that he was told to call Dr. Yetta Barre back if he was not improving.   Please advise next steps.   Veronda Prude, RN

## 2023-03-13 NOTE — Telephone Encounter (Signed)
Spoke with patient on the phone. He states he feels no different since starting abx for LLL PNA 3 days ago, is still having chills, fever and cough. Denies SOB and is speaking in full sentences. Is eating and drinking okay. Does admit to non bloody diarrhea which started 2 days ago. Only having diarrhea when he would normally have a BM (about 3x/day).   A&P: CXR not yet read but is consistent with previous exam and diagnosis of LLL PNA with consolidation of LLL.  Will extend course of abx to up to 10 days, new prescription sent in. If he feels all better after 7 days, he doesn't have to take the remaining doses.  Diarrhea could be result of abx. CTM and watch for signs of dehydration which were discussed.  Return/ED precautions discussed.
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
## 2023-03-20 ENCOUNTER — Telehealth: Payer: Self-pay

## 2023-03-20 NOTE — Telephone Encounter (Signed)
Received phone call from [facility] regarding patient's CXR results.   See below.   IMPRESSION: 1. Left base consolidation most likely pneumonia. 2. Findings suggest COPD. 3. Small pleural effusions.  Will forward to ordering provider Brian Leon)  Veronda Prude, RN

## 2023-03-22 ENCOUNTER — Encounter: Payer: Self-pay | Admitting: Student

## 2023-03-22 ENCOUNTER — Telehealth: Payer: Self-pay | Admitting: Student

## 2023-03-22 NOTE — Telephone Encounter (Addendum)
Called pt to discuss CXR results which have been officially been read by radiologist confirming previous diagnosis of left lower lobe pneumonia which pt was treated for. Pt did not pick up. HIPAA compliant VM left.

## 2023-03-24 ENCOUNTER — Other Ambulatory Visit: Payer: Self-pay | Admitting: Physician Assistant

## 2023-03-24 NOTE — Telephone Encounter (Signed)
Last Fill: 12/26/2022  Next Visit:  03/28/2023   Last Visit: 11/01/2022   Dx: Polymyalgia rheumatica   Current Dose per office note on 11/01/2022: prednisone 3 mg daily   Okay to refill Prednisone?

## 2023-03-28 ENCOUNTER — Ambulatory Visit: Payer: PRIVATE HEALTH INSURANCE | Admitting: Rheumatology

## 2023-03-28 DIAGNOSIS — Z7952 Long term (current) use of systemic steroids: Secondary | ICD-10-CM

## 2023-03-28 DIAGNOSIS — N132 Hydronephrosis with renal and ureteral calculous obstruction: Secondary | ICD-10-CM

## 2023-03-28 DIAGNOSIS — M19041 Primary osteoarthritis, right hand: Secondary | ICD-10-CM

## 2023-03-28 DIAGNOSIS — M5030 Other cervical disc degeneration, unspecified cervical region: Secondary | ICD-10-CM

## 2023-03-28 DIAGNOSIS — E559 Vitamin D deficiency, unspecified: Secondary | ICD-10-CM

## 2023-03-28 DIAGNOSIS — R911 Solitary pulmonary nodule: Secondary | ICD-10-CM

## 2023-03-28 DIAGNOSIS — A498 Other bacterial infections of unspecified site: Secondary | ICD-10-CM

## 2023-03-28 DIAGNOSIS — M353 Polymyalgia rheumatica: Secondary | ICD-10-CM

## 2023-03-28 DIAGNOSIS — Z9889 Other specified postprocedural states: Secondary | ICD-10-CM

## 2023-03-28 DIAGNOSIS — I10 Essential (primary) hypertension: Secondary | ICD-10-CM

## 2023-03-28 DIAGNOSIS — M810 Age-related osteoporosis without current pathological fracture: Secondary | ICD-10-CM

## 2023-03-28 DIAGNOSIS — Z79899 Other long term (current) drug therapy: Secondary | ICD-10-CM

## 2023-03-28 DIAGNOSIS — Z872 Personal history of diseases of the skin and subcutaneous tissue: Secondary | ICD-10-CM

## 2023-03-28 DIAGNOSIS — Z970 Presence of artificial eye: Secondary | ICD-10-CM

## 2023-03-28 DIAGNOSIS — Z862 Personal history of diseases of the blood and blood-forming organs and certain disorders involving the immune mechanism: Secondary | ICD-10-CM

## 2023-03-30 ENCOUNTER — Ambulatory Visit (INDEPENDENT_AMBULATORY_CARE_PROVIDER_SITE_OTHER): Payer: Medicare HMO | Admitting: Family Medicine

## 2023-03-30 ENCOUNTER — Other Ambulatory Visit: Payer: Self-pay

## 2023-03-30 VITALS — BP 145/88 | HR 83 | Ht 69.5 in | Wt 131.8 lb

## 2023-03-30 DIAGNOSIS — D649 Anemia, unspecified: Secondary | ICD-10-CM

## 2023-03-30 DIAGNOSIS — E785 Hyperlipidemia, unspecified: Secondary | ICD-10-CM

## 2023-03-30 DIAGNOSIS — M6740 Ganglion, unspecified site: Secondary | ICD-10-CM | POA: Diagnosis not present

## 2023-03-30 DIAGNOSIS — J189 Pneumonia, unspecified organism: Secondary | ICD-10-CM | POA: Diagnosis not present

## 2023-03-30 DIAGNOSIS — M810 Age-related osteoporosis without current pathological fracture: Secondary | ICD-10-CM

## 2023-03-30 DIAGNOSIS — I10 Essential (primary) hypertension: Secondary | ICD-10-CM

## 2023-03-30 DIAGNOSIS — E7800 Pure hypercholesterolemia, unspecified: Secondary | ICD-10-CM

## 2023-03-30 NOTE — Patient Instructions (Signed)
Good to see you today - Thank you for coming in  Things we discussed today:  1) I ordered a DEXA scan to look at your bone density. You can call the [facility] to schedule it.  [phone]  2) The bump on your elbow is most likely a ganglion cyst, which is a benign rubbery growth. Seek medical care if it suddenly increases in size, becomes painful, or look inflamed or irritated.  3) Let's check a few labs to check your anemia.

## 2023-03-30 NOTE — Progress Notes (Signed)
    SUBJECTIVE:   CHIEF COMPLAINT / HPI:   Brian Leon is a 77yo M p/f follow-up  PNA Recovering from covid and PNA. Feels more worn out - Uses albuterol 3-5 times a day. Takes when he feels SOB or  wheezing. Feels better afterwards. He also feels like he takes it out of habit sometimes. - Has good appetite.  - No more fevers  HTN - Takes losartan. Didn't take this morning.  - At home, it is 133/80. Reports that home SBP never gets above 144.  Mass on Elbow - Recently noticed a Rubbery mass on L elbow. Pt is not concerned that it is dangerous, but wanted to get it checked out.  Osteoporosis - Rheumatologist recommended getting another DEXA given his long-term steroid use. - Takes a daily Vitamin D supplement, unsure of the dose  PERTINENT  PMH / PSH: HTN, AAA, asthma,   OBJECTIVE:   BP (!) 145/88   Pulse 83   Ht 5' 9.5" (1.765 m)   Wt 131 lb 12.8 oz (59.8 kg)   SpO2 97%   BMI 19.18 kg/m   Gen: Alert, pleasant older man speaking in full sentences and NAD. HEENT: NCAT. MMM. CV: RRR Resp: CTAB. Normal WOB on RA Abm: Soft, nontender, nondistended. Skin: round, mobile, rubbery mass on L elbow; nontender and no skin changes.   ASSESSMENT/PLAN:   Pneumonia of left lower lobe due to infectious organism Pt recovering well after his PNA and covid infxn. Denies anymore fevers. Breathing continues to improve. Still having lingering fatigue. VSS and lung exam clear.  Ganglion cyst Rubbery, round, mobile, nontender mass on L elbow. Nontender. Differential includes ganglion cyst vs lipoma. Given rubbery nature of mass, most c/w ganglion cyst. Low c/f infection such as abscess given lack of pain or skin changes. Discussed that these are benign and recommend coming back if it grows, becomes tender, becomes erythematous.  Hypercholesteremia Lipid panel today, at goal. Continue Atorvastatin 10mg  daily  Osteoporosis On Prolia for osteoporosis. Pt is on chronic steroids for polymyalgia  rheumatica. Was recommended by Rheumatologist to repeat DEXA and have PCP order. - DEXA ordered - Continue Prolia per Rheum  Essential (primary) hypertension Did not take losartan today. Reports home BP reads to be around 133/80. Reports home SBP never above 144. Given home BP is at goal, no changes needed today. - Cont Losartan  Anemia Has chronic hx of normocytic anemia, Hgb today 11.1 (baseline hgb ~11). Given chronic polymyalgia rheumatica, most likely anemia of chronic disease. Given pt age, malignancy is also considered. Iron panel wnl, ruling out IDA. B12 wnl. Will f/u retic and smear.    Lincoln Brigham, MD [facility]

## 2023-03-31 DIAGNOSIS — M6740 Ganglion, unspecified site: Secondary | ICD-10-CM | POA: Insufficient documentation

## 2023-03-31 LAB — CBC
Hematocrit: 34.1 % — ABNORMAL LOW (ref 37.5–51.0)
Hemoglobin: 11.2 g/dL — ABNORMAL LOW (ref 13.0–17.7)
MCH: 31.3 pg (ref 26.6–33.0)
MCHC: 32.8 g/dL (ref 31.5–35.7)
MCV: 95 fL (ref 79–97)
Platelets: 538 10*3/uL — ABNORMAL HIGH (ref 150–450)
RBC: 3.58 x10E6/uL — ABNORMAL LOW (ref 4.14–5.80)
RDW: 12.2 % (ref 11.6–15.4)
WBC: 12.3 10*3/uL — ABNORMAL HIGH (ref 3.4–10.8)

## 2023-03-31 LAB — PATHOLOGIST SMEAR REVIEW
Basophils Absolute: 0.1 10*3/uL (ref 0.0–0.2)
Basos: 0 %
Hemoglobin: 11.1 g/dL — ABNORMAL LOW (ref 13.0–17.7)
Immature Grans (Abs): 0.1 10*3/uL (ref 0.0–0.1)
Lymphocytes Absolute: 2.3 10*3/uL (ref 0.7–3.1)
MCHC: 34.8 g/dL (ref 31.5–35.7)
Platelets: 517 10*3/uL — ABNORMAL HIGH (ref 150–450)

## 2023-03-31 LAB — IRON,TIBC AND FERRITIN PANEL
Ferritin: 230 ng/mL (ref 30–400)
Iron Saturation: 19 % (ref 15–55)
Iron: 51 ug/dL (ref 38–169)
Total Iron Binding Capacity: 264 ug/dL (ref 250–450)
UIBC: 213 ug/dL (ref 111–343)

## 2023-03-31 LAB — LIPID PANEL
Chol/HDL Ratio: 2.6 ratio (ref 0.0–5.0)
Cholesterol, Total: 145 mg/dL (ref 100–199)
HDL: 55 mg/dL (ref >39)
LDL Chol Calc (NIH): 78 mg/dL (ref 0–99)
Triglycerides: 54 mg/dL (ref 0–149)
VLDL Cholesterol Cal: 12 mg/dL (ref 5–40)

## 2023-03-31 LAB — VITAMIN B12: Vitamin B-12: 438 pg/mL (ref 232–1245)

## 2023-03-31 NOTE — Assessment & Plan Note (Addendum)
On Prolia for osteoporosis. Pt is on chronic steroids for polymyalgia rheumatica. Was recommended by Rheumatologist to repeat DEXA and have PCP order. - DEXA ordered - Continue Prolia per Rheum

## 2023-03-31 NOTE — Assessment & Plan Note (Signed)
Has chronic hx of normocytic anemia, Hgb today 11.1 (baseline hgb ~11). Given chronic polymyalgia rheumatica, most likely anemia of chronic disease. Given pt age, malignancy is also considered. Iron panel wnl, ruling out IDA. B12 wnl. Will f/u retic and smear.

## 2023-03-31 NOTE — Assessment & Plan Note (Signed)
Pt recovering well after his PNA and covid infxn. Denies anymore fevers. Breathing continues to improve. Still having lingering fatigue. VSS and lung exam clear.

## 2023-03-31 NOTE — Assessment & Plan Note (Signed)
Did not take losartan today. Reports home BP reads to be around 133/80. Reports home SBP never above 144. Given home BP is at goal, no changes needed today. - Cont Losartan

## 2023-03-31 NOTE — Assessment & Plan Note (Signed)
Rubbery, round, mobile, nontender mass on L elbow. Nontender. Differential includes ganglion cyst vs lipoma. Given rubbery nature of mass, most c/w ganglion cyst. Low c/f infection such as abscess given lack of pain or skin changes. Discussed that these are benign and recommend coming back if it grows, becomes tender, becomes erythematous.

## 2023-03-31 NOTE — Assessment & Plan Note (Signed)
Lipid panel today, at goal. Continue Atorvastatin 10mg  daily

## 2023-04-01 LAB — PATHOLOGIST SMEAR REVIEW
EOS (ABSOLUTE): 0.6 10*3/uL — ABNORMAL HIGH (ref 0.0–0.4)
Eos: 5 %
Immature Granulocytes: 1 %
Lymphs: 19 %
Neutrophils Absolute: 8 10*3/uL — ABNORMAL HIGH (ref 1.4–7.0)
WBC: 12.1 10*3/uL — ABNORMAL HIGH (ref 3.4–10.8)

## 2023-04-01 LAB — RETICULOCYTES: Retic Ct Pct: 1.5 % (ref 0.6–2.6)

## 2023-04-01 LAB — SPECIMEN STATUS REPORT

## 2023-04-03 ENCOUNTER — Telehealth: Payer: Self-pay

## 2023-04-03 DIAGNOSIS — D485 Neoplasm of uncertain behavior of skin: Secondary | ICD-10-CM | POA: Diagnosis not present

## 2023-04-03 DIAGNOSIS — L2084 Intrinsic (allergic) eczema: Secondary | ICD-10-CM | POA: Diagnosis not present

## 2023-04-03 DIAGNOSIS — C44219 Basal cell carcinoma of skin of left ear and external auricular canal: Secondary | ICD-10-CM | POA: Diagnosis not present

## 2023-04-03 DIAGNOSIS — L570 Actinic keratosis: Secondary | ICD-10-CM | POA: Diagnosis not present

## 2023-04-03 NOTE — Telephone Encounter (Signed)
Patient calls nurse line regarding recent results. He was able to view results via mychart and has additional questions. He is requesting returned call at [phone] to discuss results further.   Veronda Prude, RN

## 2023-04-04 LAB — PATHOLOGIST SMEAR REVIEW
Hematocrit: 31.9 % — ABNORMAL LOW (ref 37.5–51.0)
MCH: 32.8 pg (ref 26.6–33.0)
MCV: 94 fL (ref 79–97)
Monocytes Absolute: 1 10*3/uL — ABNORMAL HIGH (ref 0.1–0.9)
Monocytes: 9 %
Neutrophils: 66 %
RBC: 3.38 x10E6/uL — ABNORMAL LOW (ref 4.14–5.80)
RDW: 11.9 % (ref 11.6–15.4)

## 2023-04-04 NOTE — Telephone Encounter (Signed)
Attempted to call back, no answer so left a VM.   Regarding pt's labs, they all seem stable. I am still awaiting the pathology read of his blood smear, but otherwise the results of his CBC, iron, cholesterol, Vitamin b12 are normal. I will reach out with more information once we get the blood smear back.

## 2023-04-07 ENCOUNTER — Telehealth: Payer: Self-pay | Admitting: Family Medicine

## 2023-04-07 DIAGNOSIS — D696 Thrombocytopenia, unspecified: Secondary | ICD-10-CM

## 2023-04-07 NOTE — Telephone Encounter (Signed)
Called pt to discuss lab results. Reassuringly, leukocytosis and anemia are at baseline. Thrombocytosis is most likely reactive from anemia, eosinophilia is most likely related to an allergy.  Will check CBC with differential to check for resolution. -Standing order placed for CBC with differential.  Discussed with patient to come back for lab draw in about 1 month.

## 2023-04-09 ENCOUNTER — Other Ambulatory Visit: Payer: Self-pay | Admitting: Cardiology

## 2023-04-14 ENCOUNTER — Other Ambulatory Visit: Payer: Self-pay | Admitting: Cardiology

## 2023-04-24 ENCOUNTER — Other Ambulatory Visit: Payer: Self-pay | Admitting: Cardiology
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
## 2023-04-25 ENCOUNTER — Other Ambulatory Visit (HOSPITAL_COMMUNITY): Payer: Self-pay

## 2023-05-01 ENCOUNTER — Other Ambulatory Visit: Payer: Self-pay

## 2023-05-04 ENCOUNTER — Other Ambulatory Visit: Payer: Self-pay

## 2023-05-08 ENCOUNTER — Other Ambulatory Visit: Payer: Self-pay

## 2023-05-08 ENCOUNTER — Encounter: Payer: Self-pay | Admitting: Rheumatology

## 2023-05-08 ENCOUNTER — Other Ambulatory Visit (HOSPITAL_COMMUNITY): Payer: Self-pay

## 2023-05-08 ENCOUNTER — Ambulatory Visit: Payer: Medicare HMO | Attending: Rheumatology | Admitting: Rheumatology

## 2023-05-08 ENCOUNTER — Telehealth: Payer: Self-pay | Admitting: Pharmacist

## 2023-05-08 VITALS — BP 132/84 | HR 61 | Resp 15 | Ht 69.5 in | Wt 139.6 lb

## 2023-05-08 DIAGNOSIS — Z862 Personal history of diseases of the blood and blood-forming organs and certain disorders involving the immune mechanism: Secondary | ICD-10-CM

## 2023-05-08 DIAGNOSIS — Z9889 Other specified postprocedural states: Secondary | ICD-10-CM | POA: Diagnosis not present

## 2023-05-08 DIAGNOSIS — M5030 Other cervical disc degeneration, unspecified cervical region: Secondary | ICD-10-CM | POA: Diagnosis not present

## 2023-05-08 DIAGNOSIS — Z7952 Long term (current) use of systemic steroids: Secondary | ICD-10-CM

## 2023-05-08 DIAGNOSIS — R911 Solitary pulmonary nodule: Secondary | ICD-10-CM

## 2023-05-08 DIAGNOSIS — M810 Age-related osteoporosis without current pathological fracture: Secondary | ICD-10-CM

## 2023-05-08 DIAGNOSIS — M19041 Primary osteoarthritis, right hand: Secondary | ICD-10-CM | POA: Diagnosis not present

## 2023-05-08 DIAGNOSIS — Z872 Personal history of diseases of the skin and subcutaneous tissue: Secondary | ICD-10-CM

## 2023-05-08 DIAGNOSIS — M19042 Primary osteoarthritis, left hand: Secondary | ICD-10-CM

## 2023-05-08 DIAGNOSIS — E559 Vitamin D deficiency, unspecified: Secondary | ICD-10-CM | POA: Diagnosis not present

## 2023-05-08 DIAGNOSIS — M353 Polymyalgia rheumatica: Secondary | ICD-10-CM

## 2023-05-08 DIAGNOSIS — Z5181 Encounter for therapeutic drug level monitoring: Secondary | ICD-10-CM | POA: Diagnosis not present

## 2023-05-08 DIAGNOSIS — Z8781 Personal history of (healed) traumatic fracture: Secondary | ICD-10-CM

## 2023-05-08 DIAGNOSIS — Z970 Presence of artificial eye: Secondary | ICD-10-CM

## 2023-05-08 DIAGNOSIS — I10 Essential (primary) hypertension: Secondary | ICD-10-CM

## 2023-05-08 DIAGNOSIS — M24522 Contracture, left elbow: Secondary | ICD-10-CM

## 2023-05-08 DIAGNOSIS — N132 Hydronephrosis with renal and ureteral calculous obstruction: Secondary | ICD-10-CM

## 2023-05-08 DIAGNOSIS — A498 Other bacterial infections of unspecified site: Secondary | ICD-10-CM

## 2023-05-08 LAB — COMPLETE METABOLIC PANEL WITH GFR
AG Ratio: 1.5 (calc) (ref 1.0–2.5)
ALT: 14 U/L (ref 9–46)
AST: 14 U/L (ref 10–35)
Albumin: 3.9 g/dL (ref 3.6–5.1)
Alkaline phosphatase (APISO): 80 U/L (ref 35–144)
BUN: 18 mg/dL (ref 7–25)
CO2: 28 mmol/L (ref 20–32)
Calcium: 9.5 mg/dL (ref 8.6–10.3)
Chloride: 109 mmol/L (ref 98–110)
Creat: 0.96 mg/dL (ref 0.70–1.28)
Globulin: 2.6 g/dL (calc) (ref 1.9–3.7)
Glucose, Bld: 98 mg/dL (ref 65–99)
Potassium: 4.5 mmol/L (ref 3.5–5.3)
Sodium: 142 mmol/L (ref 135–146)
Total Bilirubin: 0.6 mg/dL (ref 0.2–1.2)
Total Protein: 6.5 g/dL (ref 6.1–8.1)
eGFR: 80 mL/min/{1.73_m2} (ref >=60)

## 2023-05-08 LAB — CBC WITH DIFFERENTIAL/PLATELET
Absolute Monocytes: 792 cells/uL (ref 200–950)
Basophils Absolute: 64 cells/uL (ref 0–200)
Basophils Relative: 0.7 %
Eosinophils Absolute: 1001 cells/uL — ABNORMAL HIGH (ref 15–500)
Eosinophils Relative: 11 %
HCT: 35.9 % — ABNORMAL LOW (ref 38.5–50.0)
Hemoglobin: 11.9 g/dL — ABNORMAL LOW (ref 13.2–17.1)
Lymphs Abs: 2657 cells/uL (ref 850–3900)
MCH: 32.7 pg (ref 27.0–33.0)
MCHC: 33.1 g/dL (ref 32.0–36.0)
MCV: 98.6 fL (ref 80.0–100.0)
MPV: 8.7 fL (ref 7.5–12.5)
Monocytes Relative: 8.7 %
Neutro Abs: 4586 cells/uL (ref 1500–7800)
Neutrophils Relative %: 50.4 %
Platelets: 361 10*3/uL (ref 140–400)
RBC: 3.64 10*6/uL — ABNORMAL LOW (ref 4.20–5.80)
RDW: 12.8 % (ref 11.0–15.0)
Total Lymphocyte: 29.2 %
WBC: 9.1 10*3/uL (ref 3.8–10.8)

## 2023-05-08 MED ORDER — DENOSUMAB 60 MG/ML ~~LOC~~ SOSY
60.0000 mg | PREFILLED_SYRINGE | SUBCUTANEOUS | 0 refills | Status: DC
Start: 2023-05-08 — End: 2023-11-27
  Filled 2023-05-08: qty 1, 180d supply, fill #0

## 2023-05-08 NOTE — Telephone Encounter (Signed)
Patient due for Prolia on 05/15/23. Labs completed at [facility] today.  Per test claim, patient has no copay for Prolia. Rx for Prolia sent to [facility] to be couriered to clinic by 05/15/23  Chesley Mires, PharmD, MPH, BCPS, CPP Clinical Pharmacist (Rheumatology and Pulmonology)

## 2023-05-09 ENCOUNTER — Other Ambulatory Visit: Payer: Self-pay

## 2023-05-09 NOTE — Telephone Encounter (Signed)
CBC, CMP, Vitamin D wnl to proceed with Prolia. ATC patient to discuss. Left VM requesting return call  Chesley Mires, PharmD, MPH, BCPS, CPP Clinical Pharmacist (Rheumatology and Pulmonology)

## 2023-05-09 NOTE — Progress Notes (Signed)
Hemoglobin is low and stable.  CMP normal, vitamin D normal.

## 2023-05-10 NOTE — Telephone Encounter (Signed)
Discussed lab results with pt. Prolia appt scheduled for 05/16/23  Chesley Mires, PharmD, MPH, BCPS, CPP Clinical Pharmacist (Rheumatology and Pulmonology)

## 2023-05-10 NOTE — Telephone Encounter (Signed)
Prolia received from pharmacy. Placed in fridge

## 2023-05-15 NOTE — Progress Notes (Unsigned)
Pharmacy Note  Subjective:   Patient presents to clinic today to receive bi-annual dose of Prolia. Patient's last dose of Prolia was on 11/16/2022  Patient running a fever or have signs/symptoms of infection? {yes/no:20286}  Patient currently on antibiotics for the treatment of infection? {yes/no:20286}  Patient had fall in the last 6 months?  {yes/no:20286}  If yes, did it require medical attention? {yes/no:20286}   Patient taking calcium 1200 mg daily through diet or supplement and at least 800 units vitamin D? {yes/no:20286}  Objective: CMP     Component Value Date/Time   NA 142 05/08/2023 0826   NA 139 05/19/2022 1150   K 4.5 05/08/2023 0826   CL 109 05/08/2023 0826   CO2 28 05/08/2023 0826   GLUCOSE 98 05/08/2023 0826   BUN 18 05/08/2023 0826   BUN 11 05/19/2022 1150   CREATININE 0.96 05/08/2023 0826   CALCIUM 9.5 05/08/2023 0826   PROT 6.5 05/08/2023 0826   PROT 6.3 03/31/2022 1344   ALBUMIN 3.8 03/31/2022 1344   AST 14 05/08/2023 0826   ALT 14 05/08/2023 0826   ALKPHOS 85 03/31/2022 1344   BILITOT 0.6 05/08/2023 0826   BILITOT 0.4 03/31/2022 1344   GFRNONAA >60 09/25/2022 0219   GFRNONAA 67 03/17/2021 1059   GFRAA 77 03/17/2021 1059    CBC    Component Value Date/Time   WBC 9.1 05/08/2023 0826   RBC 3.64 (L) 05/08/2023 0826   HGB 11.9 (L) 05/08/2023 0826   HGB 11.2 (L) 03/30/2023 1157   HGB 11.1 (L) 03/30/2023 1157   HCT 35.9 (L) 05/08/2023 0826   HCT 34.1 (L) 03/30/2023 1157   HCT 31.9 (L) 03/30/2023 1157   PLT 361 05/08/2023 0826   PLT 538 (H) 03/30/2023 1157   PLT 517 (H) 03/30/2023 1157   MCV 98.6 05/08/2023 0826   MCV 95 03/30/2023 1157   MCV 94 03/30/2023 1157   MCH 32.7 05/08/2023 0826   MCHC 33.1 05/08/2023 0826   RDW 12.8 05/08/2023 0826   RDW 12.2 03/30/2023 1157   RDW 11.9 03/30/2023 1157   LYMPHSABS 2,657 05/08/2023 0826   LYMPHSABS 2.3 03/30/2023 1157   MONOABS 1.0 09/23/2022 2156   EOSABS 1,001 (H) 05/08/2023 0826   EOSABS 0.6 (H)  03/30/2023 1157   BASOSABS 64 05/08/2023 0826   BASOSABS 0.1 03/30/2023 1157    Lab Results  Component Value Date   VD25OH 36 05/08/2023    T-score: ***  Assessment/Plan:   Reviewed importance of adequate dietary intake of calcium in addition to supplementation due to risk of hypocalcemia with Prolia.   Patient tolerated injection  ***.   Patient's next Prolia dose is due on 11/12/2023.  Patient is due for updated DEXA in January 2025. Currently scheduled for 10/11/2023.   All questions encouraged and answered.  Instructed patient to call with any further questions or concerns.  Chesley Mires, PharmD, MPH, BCPS, CPP Clinical Pharmacist (Rheumatology and Pulmonology)

## 2023-05-16 ENCOUNTER — Ambulatory Visit: Payer: Medicare HMO | Attending: Rheumatology | Admitting: Pharmacist

## 2023-05-16 DIAGNOSIS — M810 Age-related osteoporosis without current pathological fracture: Secondary | ICD-10-CM | POA: Diagnosis not present

## 2023-05-16 DIAGNOSIS — Z7689 Persons encountering health services in other specified circumstances: Secondary | ICD-10-CM

## 2023-05-16 MED ORDER — DENOSUMAB 60 MG/ML ~~LOC~~ SOSY
60.0000 mg | PREFILLED_SYRINGE | Freq: Once | SUBCUTANEOUS | Status: AC
  Administered 2023-05-16: 60 mg via SUBCUTANEOUS

## 2023-05-16 NOTE — Progress Notes (Signed)
Pharmacy Note  Subjective:   Patient presents to clinic today to receive bi-annual dose of Prolia. Patient's last dose of Prolia was on 11/16/2022  Patient running a fever or have signs/symptoms of infection? No  Patient currently on antibiotics for the treatment of infection? No  Patient had fall in the last 6 months?  No  If yes, did it require medical attention? No   Patient taking calcium 1200 mg daily through diet or supplement and at least 800 units vitamin D? Yes. He reports he takes the supplements when he is able to remember.   Objective: CMP     Component Value Date/Time   NA 142 05/08/2023 0826   NA 139 05/19/2022 1150   K 4.5 05/08/2023 0826   CL 109 05/08/2023 0826   CO2 28 05/08/2023 0826   GLUCOSE 98 05/08/2023 0826   BUN 18 05/08/2023 0826   BUN 11 05/19/2022 1150   CREATININE 0.96 05/08/2023 0826   CALCIUM 9.5 05/08/2023 0826   PROT 6.5 05/08/2023 0826   PROT 6.3 03/31/2022 1344   ALBUMIN 3.8 03/31/2022 1344   AST 14 05/08/2023 0826   ALT 14 05/08/2023 0826   ALKPHOS 85 03/31/2022 1344   BILITOT 0.6 05/08/2023 0826   BILITOT 0.4 03/31/2022 1344   GFRNONAA >60 09/25/2022 0219   GFRNONAA 67 03/17/2021 1059   GFRAA 77 03/17/2021 1059    CBC    Component Value Date/Time   WBC 9.1 05/08/2023 0826   RBC 3.64 (L) 05/08/2023 0826   HGB 11.9 (L) 05/08/2023 0826   HGB 11.2 (L) 03/30/2023 1157   HGB 11.1 (L) 03/30/2023 1157   HCT 35.9 (L) 05/08/2023 0826   HCT 34.1 (L) 03/30/2023 1157   HCT 31.9 (L) 03/30/2023 1157   PLT 361 05/08/2023 0826   PLT 538 (H) 03/30/2023 1157   PLT 517 (H) 03/30/2023 1157   MCV 98.6 05/08/2023 0826   MCV 95 03/30/2023 1157   MCV 94 03/30/2023 1157   MCH 32.7 05/08/2023 0826   MCHC 33.1 05/08/2023 0826   RDW 12.8 05/08/2023 0826   RDW 12.2 03/30/2023 1157   RDW 11.9 03/30/2023 1157   LYMPHSABS 2,657 05/08/2023 0826   LYMPHSABS 2.3 03/30/2023 1157   MONOABS 1.0 09/23/2022 2156   EOSABS 1,001 (H) 05/08/2023 0826   EOSABS  0.6 (H) 03/30/2023 1157   BASOSABS 64 05/08/2023 0826   BASOSABS 0.1 03/30/2023 1157    Lab Results  Component Value Date   VD25OH 36 05/08/2023    T-score: Dual Femur Neck Right 02/04/2021 -2.8   Assessment/Plan:   Reviewed importance of adequate dietary intake of calcium in addition to supplementation due to risk of hypocalcemia with Prolia. Patient tolerated injection.  Administrations This Visit     denosumab (PROLIA) injection 60 mg     Admin Date 05/16/2023 Action Given Dose 60 mg Route Subcutaneous Documented By Gwenlyn Found, RPH             Patient's next Prolia dose is due on 11/12/2023.  Patient is due for updated DEXA in January 2025. Currently scheduled for 10/11/2023.   All questions encouraged and answered.  Instructed patient to call with any further questions or concerns.  Sofie Rower, PharmD, [facility] PGY1   Chesley Mires, PharmD, MPH, BCPS, CPP Clinical Pharmacist (Rheumatology and Pulmonology)

## 2023-05-24 ENCOUNTER — Telehealth: Payer: Self-pay | Admitting: Cardiology

## 2023-05-24 NOTE — Telephone Encounter (Signed)
Patient need medication refill for Losartan and Atorvastatin. Best number [phone]

## 2023-05-25 ENCOUNTER — Other Ambulatory Visit: Payer: Self-pay

## 2023-05-25 DIAGNOSIS — C44219 Basal cell carcinoma of skin of left ear and external auricular canal: Secondary | ICD-10-CM | POA: Diagnosis not present

## 2023-05-25 MED ORDER — ATORVASTATIN CALCIUM 10 MG PO TABS: 10.0000 mg | ORAL_TABLET | Freq: Every day | ORAL | 1 refills | Status: DC

## 2023-05-25 MED ORDER — LOSARTAN POTASSIUM 50 MG PO TABS: 50.0000 mg | ORAL_TABLET | Freq: Every day | ORAL | 1 refills | Status: DC

## 2023-06-26 ENCOUNTER — Other Ambulatory Visit: Payer: Self-pay | Admitting: Family Medicine

## 2023-06-26 ENCOUNTER — Other Ambulatory Visit: Payer: Self-pay | Admitting: Internal Medicine

## 2023-06-26 DIAGNOSIS — J301 Allergic rhinitis due to pollen: Secondary | ICD-10-CM

## 2023-06-26 NOTE — Telephone Encounter (Signed)
Last Fill: 03/24/2023  Next Visit: 11/10/2023  Last Visit: 05/08/2023  Dx: Polymyalgia rheumatica   Current Dose per office note on 05/08/2023: prednisone 3 mg daily   Okay to refill Prednisone?

## 2023-08-07 DIAGNOSIS — R350 Frequency of micturition: Secondary | ICD-10-CM | POA: Diagnosis not present

## 2023-08-07 DIAGNOSIS — C641 Malignant neoplasm of right kidney, except renal pelvis: Secondary | ICD-10-CM | POA: Diagnosis not present

## 2023-08-07 DIAGNOSIS — N5201 Erectile dysfunction due to arterial insufficiency: Secondary | ICD-10-CM | POA: Diagnosis not present

## 2023-08-07 DIAGNOSIS — N200 Calculus of kidney: Secondary | ICD-10-CM | POA: Diagnosis not present

## 2023-08-07 DIAGNOSIS — Q646 Congenital diverticulum of bladder: Secondary | ICD-10-CM | POA: Diagnosis not present

## 2023-08-10 ENCOUNTER — Other Ambulatory Visit: Payer: Self-pay

## 2023-08-10 DIAGNOSIS — R058 Other specified cough: Secondary | ICD-10-CM

## 2023-08-11 ENCOUNTER — Ambulatory Visit (INDEPENDENT_AMBULATORY_CARE_PROVIDER_SITE_OTHER): Payer: Medicare HMO | Admitting: Family Medicine

## 2023-08-11 ENCOUNTER — Encounter: Payer: Self-pay | Admitting: Family Medicine

## 2023-08-11 ENCOUNTER — Other Ambulatory Visit: Payer: Self-pay

## 2023-08-11 ENCOUNTER — Other Ambulatory Visit (HOSPITAL_COMMUNITY): Payer: Self-pay

## 2023-08-11 VITALS — BP 120/73 | HR 84 | Ht 69.5 in | Wt 140.0 lb

## 2023-08-11 DIAGNOSIS — R0602 Shortness of breath: Secondary | ICD-10-CM

## 2023-08-11 DIAGNOSIS — R058 Other specified cough: Secondary | ICD-10-CM

## 2023-08-11 MED ORDER — MONTELUKAST SODIUM 10 MG PO TABS
10.0000 mg | ORAL_TABLET | Freq: Every day | ORAL | 1 refills | Status: DC
Start: 2023-08-11 — End: 2024-05-15
  Filled 2023-08-11: qty 30, 30d supply, fill #0

## 2023-08-11 MED ORDER — CETIRIZINE HCL 10 MG PO TABS
10.0000 mg | ORAL_TABLET | Freq: Every day | ORAL | 1 refills | Status: DC
Start: 2023-08-11 — End: 2024-05-15
  Filled 2023-08-11: qty 30, 30d supply, fill #0

## 2023-08-11 MED ORDER — AZITHROMYCIN 500 MG PO TABS
500.0000 mg | ORAL_TABLET | Freq: Every day | ORAL | 0 refills | Status: DC
  Filled 2023-08-11: qty 3, 3d supply, fill #0

## 2023-08-11 NOTE — Patient Instructions (Signed)
Good to see you today - Thank you for coming in  Things we discussed today:  1) For your shortness of breath, it may be due to infection or fluid overload. The recent change in weather could exacerbate asthma as well. - Start taking Azithromycin 500mg  daily for 3 days - Take Zyrtec daily to help with allergies and asthma - Take Singulair once a day. It can help with allergies and asthma. - Continue to use your albuterol and advair - We will get an updated echocardiogram to look at your heart in case fluid build-up is also contributing  Please come back in 1 week if your symptoms are not improving.   Please seek further medical attention if you: - have trouble breathing - have chest pain - have fevers of 100.84F or higher for 3 days in a row

## 2023-08-11 NOTE — Progress Notes (Unsigned)
    SUBJECTIVE:   CHIEF COMPLAINT / HPI:   Brian Leon is a 77yo M w/ hx of asthma that p/f coughing - Having shortness of breath with the last week. Gets worse when he lays down. Needing to sit up to sleep better.  - Was also having increasing cough that started dry, then becoming more productive. The sputum looks yellow-grey.  - Also reports some wheezing. Has albuterol inhaler for asthma, feels like it is helping.  - Denies LE edema.  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 Currently occasionally smokes weed.  - Denies fevers -currently feels pretty good, but reports symptoms get worse at night - Last had PFTs 20 years ago.   HTN - last took meds today, has been taking pretty consistently. Missed about 8 doses in the last month.   OBJECTIVE:   BP 120/73   Pulse 84   Ht 5' 9.5" (1.765 m)   Wt 140 lb (63.5 kg)   SpO2 100%   BMI 20.38 kg/m   General: Alert, pleasant man. NAD. HEENT: NCAT. MMM. CV: RRR, no murmurs. Cap refill <2. Resp: CTAB, no wheezing or crackles. Normal WOB on RA. Productive cough Abm: Soft, nontender, nondistended. BS present. Ext: Moves all ext spontaneously. 2+ pitting edema in BL LE Skin: Warm, well perfused    ASSESSMENT/PLAN:   No problem-specific Assessment & Plan notes found for this encounter.  Assessment & Plan Shortness of breath Differential includes bronchitis, PNA, asthma/COPD exac, CHF. Bronchitisand PNA (atypical) is considered given sputum changes. Asthma/COPD considered given hx of ashtma and smoking hx (no official COPD diagnosis), but no wheezing on exam. CHF considered given orthopnea, but pt reports his leg swelling is at baseline.  - Start Azithro 500mg  daily x3 days for potential borbnchitis/atypical PNA - Start zyrtec for potential asthma/allergy component - Start Singulair 10mg  daily for asthma component - Get updated echocardiogram - Once pt symptoms improves, recommend scheduling w/ Dr. Raymondo Band for  PFT's - Return precautions discussed     Brian Brigham, MD [facility]

## 2023-08-11 NOTE — Telephone Encounter (Signed)
[facility] is informing PCP via fax that the patient is requesting a medication. [facility] was not in pharmacy list, it is updated as of now. Penni Bombard CMA

## 2023-08-14 MED ORDER — ALBUTEROL SULFATE HFA 108 (90 BASE) MCG/ACT IN AERS
2.0000 | INHALATION_SPRAY | Freq: Four times a day (QID) | RESPIRATORY_TRACT | 3 refills | Status: DC | PRN
Start: 2023-08-14 — End: 2024-04-01

## 2023-09-06 ENCOUNTER — Ambulatory Visit: Payer: Medicare HMO | Admitting: Family Medicine

## 2023-09-06 ENCOUNTER — Encounter: Payer: Self-pay | Admitting: Family Medicine

## 2023-09-06 VITALS — BP 150/85 | HR 75 | Ht 69.0 in | Wt 138.0 lb

## 2023-09-06 DIAGNOSIS — R1032 Left lower quadrant pain: Secondary | ICD-10-CM

## 2023-09-06 NOTE — Patient Instructions (Signed)
Good to see you today - Thank you for coming in  Things we discussed today:  1) Your abdominal pain is most likely related to your hernia. However, let's get a CT scan to check for any other concerning causes. - I will reach out with the results  Please seek further medical attention if: - you are vomiting uncontrollably -Having blood in your stool or urine -Losing weight unintentionally -Your abdominal pain becomes uncontrolled. -You are consistently waking up from sleep drenched in sweat

## 2023-09-06 NOTE — Progress Notes (Signed)
    SUBJECTIVE:   CHIEF COMPLAINT / HPI:   Brian Leon is a 77yo M w/ hx of HTN, asthma, HLD, and hx of L inguinal hernia s/p repair that p/f concern of cancer. - has been having abdominal pain for past 1-2 months.  - Dull pain, comes and goes. Advil helps. - Reports the pain is right over where his prior inguinal hernia repair was, but he thinks that the pain is "deeper". - Denies bloody stool, constipation, or diarrhea. - Sister got pancreatic cancer about 1 year and had similar abdominal pain when she was diagnosed. - Reports 1 year hx of decreased appetite - Denies night sweats, hematuria, not coughing up blood. Weight is stable.    OBJECTIVE:   BP (!) 150/88   Pulse 75   Ht 5\' 9"  (1.753 m)   Wt 138 lb (62.6 kg)   SpO2 99%   BMI 20.38 kg/m   General: Alert, pleasant man. NAD. HEENT: NCAT. MMM. CV: RRR, no murmurs.  Resp: CTAB, no wheezing or crackles. Normal WOB on RA.  Abm: Mild discomfort to palpation in LLQ, right over hernia. No rebound, rigidity, or guarding. Pain more prominent with standing.Soft, nontender, nondistended. BS present. Ext: Moves all ext spontaneously Skin: Warm, well perfused     ASSESSMENT/PLAN:   Assessment & Plan Left lower quadrant abdominal pain Most likely irritation related to prior inguinal hernia, however low concern for incarceration given minimal pain, normal stool. Ressuring that it responds well to advil and weight is stable. Pt is very concerned about potential malignancy. Colonoscopy 2022 unremarkable for malignancy. Shared decision making w/ pt, disucssed getting [place] vs CT. Pt prefers CT and I think this is reasonable to rule out incarceration of hernia or malignancy. - CTAP ordered - Return precautions discussed     Lincoln Brigham, MD [facility]

## 2023-09-07 ENCOUNTER — Ambulatory Visit: Payer: Medicare HMO | Attending: Cardiology | Admitting: Cardiology

## 2023-09-07 ENCOUNTER — Encounter: Payer: Self-pay | Admitting: Cardiology

## 2023-09-07 VITALS — BP 131/80 | Ht 69.0 in | Wt 140.0 lb

## 2023-09-07 DIAGNOSIS — I7121 Aneurysm of the ascending aorta, without rupture: Secondary | ICD-10-CM

## 2023-09-07 DIAGNOSIS — I10 Essential (primary) hypertension: Secondary | ICD-10-CM | POA: Diagnosis not present

## 2023-09-07 DIAGNOSIS — E7800 Pure hypercholesterolemia, unspecified: Secondary | ICD-10-CM
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
## 2023-09-07 NOTE — Addendum Note (Signed)
Addended by: Baldo Ash D on: 09/07/2023 11:43 AM   Modules accepted: Orders

## 2023-09-07 NOTE — Patient Instructions (Addendum)
Medication Instructions:  Your physician recommends that you continue on your current medications as directed. Please refer to the Current Medication list given to you today.  *If you need a refill on your cardiac medications before your next appointment, please call your pharmacy*   Lab Work: [facility] 3rd floor M-F 8-11:3- 1-4 If you have labs (blood work) drawn today and your tests are completely normal, you will receive your results only by: MyChart Message (if you have MyChart) OR A paper copy in the mail If you have any lab test that is abnormal or we need to change your treatment, we will call you to review the results.   Testing/Procedures: Non-Cardiac CT Angiography (CTA), is a special type of CT scan that uses a computer to produce multi-dimensional views of major blood vessels throughout the body. In CT angiography, a contrast material is injected through an IV to help visualize the blood vessels    Follow-Up: At [facility], you and your health needs are our priority.  As part of our continuing mission to provide you with exceptional heart care, we have created designated Provider Care Teams.  These Care Teams include your primary Cardiologist (physician) and Advanced Practice Providers (APPs -  Physician Assistants and Nurse Practitioners) who all work together to provide you with the care you need, when you need it.  We recommend signing up for the patient portal called "MyChart".  Sign up information is provided on this After Visit Summary.  MyChart is used to connect with patients for Virtual Visits (Telemedicine).  Patients are able to view lab/test results, encounter notes, upcoming appointments, etc.  Non-urgent messages can be sent to your provider as well.   To learn more about what you can do with MyChart, go to [email or web address].    Your next appointment:   6 month(s)  The format for your next appointment:   In Person  Provider:   Gypsy Balsam,  MD    Other Instructions NA

## 2023-09-08 LAB — BASIC METABOLIC PANEL
BUN/Creatinine Ratio: 16 (ref 10–24)
BUN: 16 mg/dL (ref 8–27)
CO2: 21 mmol/L (ref 20–29)
Calcium: 9.1 mg/dL (ref 8.6–10.2)
Chloride: 107 mmol/L — ABNORMAL HIGH (ref 96–106)
Creatinine, Ser: 1.01 mg/dL (ref 0.76–1.27)
Glucose: 95 mg/dL (ref 70–99)
Potassium: 5.2 mmol/L (ref 3.5–5.2)
Sodium: 140 mmol/L (ref 134–144)
eGFR: 76 mL/min/{1.73_m2} (ref >59)

## 2023-09-12 ENCOUNTER — Ambulatory Visit (HOSPITAL_COMMUNITY): Payer: Medicare HMO

## 2023-09-13 ENCOUNTER — Telehealth: Payer: Self-pay

## 2023-09-13 NOTE — Telephone Encounter (Signed)
Patient LVM on nurse line in regards to CT scan.   He reports insurance denied his CT scan and it was cancelled.   However, the note states insurance was not authorized yet.   Will forward to referral coordinator for clarification.

## 2023-09-14 NOTE — Telephone Encounter (Signed)
Patient has been updated.   Radiology information given to patient to call and schedule CT scan.   Patient was appreciative.

## 2023-09-15 ENCOUNTER — Ambulatory Visit (HOSPITAL_COMMUNITY)
Admission: RE | Admit: 2023-09-15 | Discharge: 2023-09-15 | Disposition: A | Discharge status: Home / Self Care | Payer: Medicare HMO | Source: Ambulatory Visit | Attending: Family Medicine | Admitting: Family Medicine

## 2023-09-15 DIAGNOSIS — R1032 Left lower quadrant pain: Secondary | ICD-10-CM | POA: Diagnosis not present

## 2023-09-15 DIAGNOSIS — N281 Cyst of kidney, acquired: Secondary | ICD-10-CM | POA: Diagnosis not present

## 2023-09-15 DIAGNOSIS — I7121 Aneurysm of the ascending aorta, without rupture: Secondary | ICD-10-CM | POA: Diagnosis not present

## 2023-09-15 DIAGNOSIS — Z905 Acquired absence of kidney: Secondary | ICD-10-CM | POA: Diagnosis not present

## 2023-09-15 MED ORDER — IOHEXOL 350 MG/ML SOLN
100.0000 mL | Freq: Once | INTRAVENOUS | Status: AC | PRN
  Administered 2023-09-15: 100 mL via INTRAVENOUS

## 2023-09-18 ENCOUNTER — Telehealth: Payer: Self-pay | Admitting: Family Medicine

## 2023-09-18 NOTE — Telephone Encounter (Signed)
Call patient to update him on his CT results.  Reassured patient that he did not have any intra-abdominal malignancy, his pancreas did not show pancreatic cancer.  He also got a CTA aorta that was ordered by his cardiologist, this incidentally showed multiple new bilateral solid pulmonary nodules measuring up to 1.5 cm.  Radiology report recommends getting a repeat CT scan in 3 to 6 months. -Recommend follow-up in 6 months for repeat CT scan.

## 2023-09-29 ENCOUNTER — Telehealth: Payer: Self-pay

## 2023-09-29 NOTE — Telephone Encounter (Signed)
CT Results reviewed with pt as per Dr. Krasowski's note.  Pt verbalized understanding and had no additional questions. Routed to PCP  

## 2023-10-02 ENCOUNTER — Ambulatory Visit (HOSPITAL_COMMUNITY)
Admission: RE | Admit: 2023-10-02 | Discharge: 2023-10-02 | Disposition: A | Discharge status: Home / Self Care | Payer: Medicare HMO | Source: Ambulatory Visit | Attending: Family Medicine | Admitting: Family Medicine

## 2023-10-02 ENCOUNTER — Other Ambulatory Visit: Payer: Self-pay

## 2023-10-02 ENCOUNTER — Telehealth: Payer: Self-pay | Admitting: Student

## 2023-10-02 DIAGNOSIS — I110 Hypertensive heart disease with heart failure: Secondary | ICD-10-CM | POA: Insufficient documentation

## 2023-10-02 DIAGNOSIS — R0602 Shortness of breath: Secondary | ICD-10-CM | POA: Diagnosis not present

## 2023-10-02 DIAGNOSIS — I77819 Aortic ectasia, unspecified site: Secondary | ICD-10-CM | POA: Diagnosis not present

## 2023-10-02 DIAGNOSIS — I5030 Unspecified diastolic (congestive) heart failure: Secondary | ICD-10-CM | POA: Diagnosis not present

## 2023-10-02 LAB — ECHOCARDIOGRAM COMPLETE
AR max vel: 2.47 cm2
AV Area VTI: 2.88 cm2
AV Area mean vel: 2.64 cm2
AV Mean grad: 3 mm[Hg]
AV Peak grad: 7.2 mm[Hg]
Ao pk vel: 1.34 m/s
Area-P 1/2: 4.04 cm2
Calc EF: 61.7 %
MV VTI: 3.54 cm2
S' Lateral: 3 cm
Single Plane A2C EF: 60.3 %
Single Plane A4C EF: 62.9 %

## 2023-10-02 MED ORDER — PREDNISONE 1 MG PO TABS: 3.0000 mg | ORAL_TABLET | Freq: Every day | ORAL | 0 refills | Status: DC

## 2023-10-02 NOTE — Telephone Encounter (Signed)
Patient contacted the office and states he would like a refill of Prednisone sent to [facility] on [address] in [place].   Last Fill: 06/26/2023  Next Visit: 11/10/2023  Last Visit: 05/08/2023  Dx: Polymyalgia rheumatica   Current Dose per office note on 05/08/2023:  He is taking prednisone 3 mg daily and is unable to taper.   Okay to refill Prednisone?

## 2023-10-02 NOTE — Progress Notes (Signed)
  Echocardiogram 2D Echocardiogram has been performed.  Ocie Doyne RDCS 10/02/2023, 1:44 PM

## 2023-10-02 NOTE — Telephone Encounter (Signed)
Called patient and confirmed identity.  Reported echocardiogram results.  Normal echocardiogram, however does show aortic dilation.  Aortic dilation is stable, he has had multiple imaging dating back to 2020 and 2023 that shows this aortic root dilation.  There has been no significant progression.  His cardiologist is also aware of his dilation.  We will continue with yearly surveillance with repeat echocardiogram.  Patient expressed understanding and agreement with plan.

## 2023-10-11 ENCOUNTER — Inpatient Hospital Stay: Admission: RE | Admit: 2023-10-11 | Payer: Medicare HMO | Source: Ambulatory Visit

## 2023-10-11 DIAGNOSIS — L570 Actinic keratosis: Secondary | ICD-10-CM | POA: Diagnosis not present

## 2023-10-11 DIAGNOSIS — Z85828 Personal history of other malignant neoplasm of skin: Secondary | ICD-10-CM | POA: Diagnosis not present

## 2023-10-11 DIAGNOSIS — L2081 Atopic neurodermatitis: Secondary | ICD-10-CM | POA: Diagnosis not present

## 2023-10-11 DIAGNOSIS — L2084 Intrinsic (allergic) eczema: Secondary | ICD-10-CM | POA: Diagnosis not present

## 2023-10-11 DIAGNOSIS — L821 Other seborrheic keratosis: Secondary | ICD-10-CM | POA: Diagnosis not present

## 2023-10-13 ENCOUNTER — Ambulatory Visit
Admission: RE | Admit: 2023-10-13 | Discharge: 2023-10-13 | Disposition: A | Discharge status: Home / Self Care | Payer: Medicare HMO | Source: Ambulatory Visit | Attending: Family Medicine

## 2023-10-13 DIAGNOSIS — M810 Age-related osteoporosis without current pathological fracture: Secondary | ICD-10-CM

## 2023-10-13 DIAGNOSIS — N958 Other specified menopausal and perimenopausal disorders: Secondary | ICD-10-CM | POA: Diagnosis not present

## 2023-10-13 DIAGNOSIS — M8588 Other specified disorders of bone density and structure, other site: Secondary | ICD-10-CM | POA: Diagnosis not present

## 2023-10-19 ENCOUNTER — Other Ambulatory Visit: Payer: Self-pay

## 2023-10-23 ENCOUNTER — Other Ambulatory Visit: Payer: Self-pay

## 2023-10-26 ENCOUNTER — Other Ambulatory Visit (HOSPITAL_COMMUNITY): Payer: Self-pay

## 2023-10-26 ENCOUNTER — Other Ambulatory Visit: Payer: Self-pay
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
## 2023-11-07 ENCOUNTER — Other Ambulatory Visit: Payer: Self-pay

## 2023-11-10 ENCOUNTER — Encounter: Payer: Self-pay | Admitting: Rheumatology

## 2023-11-10 ENCOUNTER — Other Ambulatory Visit: Payer: Self-pay | Admitting: *Deleted

## 2023-11-10 ENCOUNTER — Ambulatory Visit: Payer: Medicare HMO | Attending: Rheumatology | Admitting: Rheumatology

## 2023-11-10 VITALS — BP 124/82 | HR 71 | Resp 14 | Ht 68.0 in | Wt 144.2 lb

## 2023-11-10 DIAGNOSIS — M19042 Primary osteoarthritis, left hand: Secondary | ICD-10-CM

## 2023-11-10 DIAGNOSIS — M810 Age-related osteoporosis without current pathological fracture: Secondary | ICD-10-CM

## 2023-11-10 DIAGNOSIS — Z8781 Personal history of (healed) traumatic fracture: Secondary | ICD-10-CM | POA: Diagnosis not present

## 2023-11-10 DIAGNOSIS — I10 Essential (primary) hypertension: Secondary | ICD-10-CM

## 2023-11-10 DIAGNOSIS — Z7952 Long term (current) use of systemic steroids: Secondary | ICD-10-CM | POA: Diagnosis not present

## 2023-11-10 DIAGNOSIS — E559 Vitamin D deficiency, unspecified: Secondary | ICD-10-CM

## 2023-11-10 DIAGNOSIS — M24522 Contracture, left elbow: Secondary | ICD-10-CM | POA: Diagnosis not present

## 2023-11-10 DIAGNOSIS — N132 Hydronephrosis with renal and ureteral calculous obstruction: Secondary | ICD-10-CM

## 2023-11-10 DIAGNOSIS — A498 Other bacterial infections of unspecified site: Secondary | ICD-10-CM

## 2023-11-10 DIAGNOSIS — Z9889 Other specified postprocedural states: Secondary | ICD-10-CM | POA: Diagnosis not present

## 2023-11-10 DIAGNOSIS — Z872 Personal history of diseases of the skin and subcutaneous tissue: Secondary | ICD-10-CM

## 2023-11-10 DIAGNOSIS — Z5181 Encounter for therapeutic drug level monitoring: Secondary | ICD-10-CM

## 2023-11-10 DIAGNOSIS — R911 Solitary pulmonary nodule: Secondary | ICD-10-CM

## 2023-11-10 DIAGNOSIS — M19041 Primary osteoarthritis, right hand: Secondary | ICD-10-CM

## 2023-11-10 DIAGNOSIS — M353 Polymyalgia rheumatica: Secondary | ICD-10-CM

## 2023-11-10 DIAGNOSIS — Z970 Presence of artificial eye: Secondary | ICD-10-CM

## 2023-11-10 DIAGNOSIS — Z862 Personal history of diseases of the blood and blood-forming organs and certain disorders involving the immune mechanism: Secondary | ICD-10-CM

## 2023-11-10 DIAGNOSIS — Z131 Encounter for screening for diabetes mellitus: Secondary | ICD-10-CM | POA: Diagnosis not present

## 2023-11-10 DIAGNOSIS — M5030 Other cervical disc degeneration, unspecified cervical region: Secondary | ICD-10-CM

## 2023-11-11 LAB — COMPLETE METABOLIC PANEL WITH GFR
AG Ratio: 1.5 (calc) (ref 1.0–2.5)
ALT: 17 U/L (ref 9–46)
AST: 14 U/L (ref 10–35)
Albumin: 3.8 g/dL (ref 3.6–5.1)
Alkaline phosphatase (APISO): 67 U/L (ref 35–144)
BUN: 17 mg/dL (ref 7–25)
CO2: 26 mmol/L (ref 20–32)
Calcium: 9.6 mg/dL (ref 8.6–10.3)
Chloride: 108 mmol/L (ref 98–110)
Creat: 1.11 mg/dL (ref 0.70–1.28)
Globulin: 2.5 g/dL (ref 1.9–3.7)
Glucose, Bld: 86 mg/dL (ref 65–99)
Potassium: 4.6 mmol/L (ref 3.5–5.3)
Sodium: 141 mmol/L (ref 135–146)
Total Bilirubin: 0.6 mg/dL (ref 0.2–1.2)
Total Protein: 6.3 g/dL (ref 6.1–8.1)
eGFR: 68 mL/min/{1.73_m2} (ref >=60)

## 2023-11-11 LAB — HEMOGLOBIN A1C
Hgb A1c MFr Bld: 5.5 %{Hb} (ref <5.7)
Mean Plasma Glucose: 111 mg/dL
eAG (mmol/L): 6.2 mmol/L

## 2023-11-11 LAB — VITAMIN D 25 HYDROXY (VIT D DEFICIENCY, FRACTURES): Vit D, 25-Hydroxy: 32 ng/mL (ref 30–100)

## 2023-11-11 NOTE — Progress Notes (Signed)
 Hemoglobin A1c normal, vitamin D  normal, CMP normal.  Patient should continue to take vitamin D 

## 2023-11-12 MED ORDER — FLUTICASONE-SALMETEROL 500-50 MCG/ACT IN AEPB: 1.0000 | INHALATION_SPRAY | Freq: Two times a day (BID) | RESPIRATORY_TRACT | 4 refills | Status: AC

## 2023-11-16 ENCOUNTER — Other Ambulatory Visit: Payer: Self-pay

## 2023-11-16 DIAGNOSIS — J301 Allergic rhinitis due to pollen: Secondary | ICD-10-CM

## 2023-11-17 MED ORDER — FLUTICASONE PROPIONATE 50 MCG/ACT NA SUSP: 2.0000 | Freq: Two times a day (BID) | NASAL | 4 refills | Status: AC

## 2023-11-27 ENCOUNTER — Other Ambulatory Visit (HOSPITAL_COMMUNITY): Payer: Self-pay

## 2023-11-27 ENCOUNTER — Other Ambulatory Visit: Payer: Self-pay

## 2023-11-27 ENCOUNTER — Telehealth: Payer: Self-pay | Admitting: Pharmacist

## 2023-11-27 DIAGNOSIS — M810 Age-related osteoporosis without current pathological fracture: Secondary | ICD-10-CM

## 2023-11-27 MED ORDER — DENOSUMAB 60 MG/ML ~~LOC~~ SOSY
60.0000 mg | PREFILLED_SYRINGE | SUBCUTANEOUS | 0 refills | Status: DC
  Filled 2023-11-27: qty 1, 180d supply, fill #0

## 2023-11-27 MED ORDER — DENOSUMAB 60 MG/ML ~~LOC~~ SOSY
60.0000 mg | PREFILLED_SYRINGE | SUBCUTANEOUS | Status: AC
  Administered 2023-12-05: 60 mg via SUBCUTANEOUS

## 2023-11-27 NOTE — Telephone Encounter (Signed)
 Patient due for Prolia on 11/12/2023. Historically, he has had no copay.    Spoke with patient. He is comfortable with paying this copay since is paying towards $2000 OOP max this year with another of his medications as well.  Appt scheduled for 12/05/2023. Rx sent to [facility]  Brian Leon, PharmD, MPH, BCPS, CPP Clinical Pharmacist (Rheumatology and Pulmonology)

## 2023-11-29 ENCOUNTER — Other Ambulatory Visit: Payer: Self-pay

## 2023-12-05 ENCOUNTER — Ambulatory Visit: Payer: Medicare HMO | Attending: Rheumatology | Admitting: Pharmacist

## 2023-12-05 DIAGNOSIS — M810 Age-related osteoporosis without current pathological fracture: Secondary | ICD-10-CM | POA: Diagnosis not present

## 2023-12-05 DIAGNOSIS — Z7689 Persons encountering health services in other specified circumstances: Secondary | ICD-10-CM

## 2023-12-05 MED ORDER — DENOSUMAB 60 MG/ML ~~LOC~~ SOSY
60.0000 mg | PREFILLED_SYRINGE | SUBCUTANEOUS | Status: AC
  Administered 2024-06-04: 60 mg via SUBCUTANEOUS

## 2023-12-05 NOTE — Progress Notes (Signed)
 Pharmacy Note  Subjective:   Patient presents to clinic today to receive bi-annual dose of Prolia. Patient's last dose of Prolia was on 05/16/2023  Patient running a fever or have signs/symptoms of infection? No  Patient currently on antibiotics for the treatment of infection? No  Patient had fall in the last 6 months?  No    Patient taking calcium 1200 mg daily through diet or supplement and at least 800 units vitamin D? He is not taking calcium supplement but will plan to take calcium supplement for the first few weeks after Prolia injection. Will continue Vitamin D supplement  Objective: CMP     Component Value Date/Time   NA 141 11/10/2023 0824   NA 140 09/07/2023 1555   K 4.6 11/10/2023 0824   CL 108 11/10/2023 0824   CO2 26 11/10/2023 0824   GLUCOSE 86 11/10/2023 0824   BUN 17 11/10/2023 0824   BUN 16 09/07/2023 1555   CREATININE 1.11 11/10/2023 0824   CALCIUM 9.6 11/10/2023 0824   PROT 6.3 11/10/2023 0824   PROT 6.3 03/31/2022 1344   ALBUMIN 3.8 03/31/2022 1344   AST 14 11/10/2023 0824   ALT 17 11/10/2023 0824   ALKPHOS 85 03/31/2022 1344   BILITOT 0.6 11/10/2023 0824   BILITOT 0.4 03/31/2022 1344   GFRNONAA >60 09/25/2022 0219   GFRNONAA 67 03/17/2021 1059   GFRAA 77 03/17/2021 1059    CBC    Component Value Date/Time   WBC 9.1 05/08/2023 0826   RBC 3.64 (L) 05/08/2023 0826   HGB 11.9 (L) 05/08/2023 0826   HGB 11.2 (L) 03/30/2023 1157   HGB 11.1 (L) 03/30/2023 1157   HCT 35.9 (L) 05/08/2023 0826   HCT 34.1 (L) 03/30/2023 1157   HCT 31.9 (L) 03/30/2023 1157   PLT 361 05/08/2023 0826   PLT 538 (H) 03/30/2023 1157   PLT 517 (H) 03/30/2023 1157   MCV 98.6 05/08/2023 0826   MCV 95 03/30/2023 1157   MCV 94 03/30/2023 1157   MCH 32.7 05/08/2023 0826   MCHC 33.1 05/08/2023 0826   RDW 12.8 05/08/2023 0826   RDW 12.2 03/30/2023 1157   RDW 11.9 03/30/2023 1157   LYMPHSABS 2,657 05/08/2023 0826   LYMPHSABS 2.3 03/30/2023 1157   MONOABS 1.0 09/23/2022 2156    EOSABS 1,001 (H) 05/08/2023 0826   EOSABS 0.6 (H) 03/30/2023 1157   BASOSABS 64 05/08/2023 0826   BASOSABS 0.1 03/30/2023 1157    Lab Results  Component Value Date   VD25OH 32 11/10/2023   T-score: 10/13/2023 - The BMD measured at Femur Neck is 0.662 g/cm2 with a T-score of -2.7.  Assessment/Plan:   Reviewed importance of adequate dietary intake of calcium in addition to supplementation due to risk of hypocalcemia with Prolia.   Patient tolerated injection well.   Administration details as below: Administrations This Visit     denosumab (PROLIA) injection 60 mg     Admin Date 12/05/2023 Action Given Dose 60 mg Route Subcutaneous Documented By Murrell Redden, RPH-CPP           Patient's next Prolia dose is due on 06/02/2024.  Patient is due for updated DEXA in January 2027.   All questions encouraged and answered.  Instructed patient to call with any further questions or concerns.  Chesley Mires, PharmD, MPH, BCPS, CPP Clinical Pharmacist (Rheumatology and Pulmonology)

## 2023-12-07 ENCOUNTER — Other Ambulatory Visit: Payer: Self-pay | Admitting: Cardiology

## 2023-12-25 ENCOUNTER — Other Ambulatory Visit: Payer: Self-pay | Admitting: *Deleted

## 2023-12-25 DIAGNOSIS — R12 Heartburn: Secondary | ICD-10-CM

## 2023-12-26 MED ORDER — PANTOPRAZOLE SODIUM 20 MG PO TBEC: 20.0000 mg | DELAYED_RELEASE_TABLET | Freq: Every day | ORAL | 3 refills | Status: AC

## 2024-01-03 ENCOUNTER — Other Ambulatory Visit: Payer: Self-pay

## 2024-01-03 NOTE — Progress Notes (Signed)
 Updating refill call date - medication was dispensed 2.26.25 and administered in office  3.4.25. Clinic requested, refill flowsheet not completed in error.

## 2024-01-12 ENCOUNTER — Other Ambulatory Visit: Payer: Self-pay | Admitting: *Deleted

## 2024-01-12 MED ORDER — PREDNISONE 1 MG PO TABS: 3.0000 mg | ORAL_TABLET | Freq: Every day | ORAL | 0 refills | Status: DC

## 2024-01-12 NOTE — Telephone Encounter (Signed)
 Patient contacted the office to request a refill on Prednisone. Patient states he is taking 3 mg daily.   Last Fill: 10/02/2023  Next Visit: 05/09/2024  Last Visit: 11/10/2023  Dx: Polymyalgia rheumatica   Current Dose per office note on 11/10/2023: prednisone 3 mg p.o. daily   Okay to refill Prednisone?

## 2024-01-19 ENCOUNTER — Emergency Department (HOSPITAL_COMMUNITY)
Admission: EM | Admit: 2024-01-19 | Discharge: 2024-01-19 | Disposition: A | Discharge status: Home / Self Care | Attending: Emergency Medicine | Admitting: Emergency Medicine

## 2024-01-19 ENCOUNTER — Encounter (HOSPITAL_COMMUNITY): Payer: Self-pay | Admitting: *Deleted

## 2024-01-19 ENCOUNTER — Other Ambulatory Visit: Payer: Self-pay

## 2024-01-19 DIAGNOSIS — W208XXA Other cause of strike by thrown, projected or falling object, initial encounter: Secondary | ICD-10-CM | POA: Insufficient documentation

## 2024-01-19 DIAGNOSIS — S81811A Laceration without foreign body, right lower leg, initial encounter: Secondary | ICD-10-CM | POA: Diagnosis not present

## 2024-01-19 DIAGNOSIS — Z23 Encounter for immunization: Secondary | ICD-10-CM | POA: Insufficient documentation

## 2024-01-19 DIAGNOSIS — Y9355 Activity, bike riding: Secondary | ICD-10-CM | POA: Insufficient documentation

## 2024-01-19 DIAGNOSIS — S8991XA Unspecified injury of right lower leg, initial encounter: Secondary | ICD-10-CM | POA: Diagnosis present

## 2024-01-19 MED ORDER — CEPHALEXIN 500 MG PO CAPS: 500.0000 mg | ORAL_CAPSULE | Freq: Two times a day (BID) | ORAL | 0 refills | Status: AC

## 2024-01-19 MED ORDER — LIDOCAINE-EPINEPHRINE (PF) 2 %-1:200000 IJ SOLN
10.0000 mL | Freq: Once | INTRAMUSCULAR | Status: AC
  Administered 2024-01-19: 10 mL
  Filled 2024-01-19: qty 20

## 2024-01-19 MED ORDER — TETANUS-DIPHTH-ACELL PERTUSSIS 5-2.5-18.5 LF-MCG/0.5 IM SUSY
0.5000 mL | PREFILLED_SYRINGE | Freq: Once | INTRAMUSCULAR | Status: AC
  Administered 2024-01-19: 0.5 mL via INTRAMUSCULAR
  Filled 2024-01-19: qty 0.5

## 2024-01-19 NOTE — Discharge Instructions (Addendum)
 Follow-up with PCP in 2-3 days for wound check. Follow-up with wound clinic for further management. Sutures should remain in place for ~10 days, see PCP or wound clinic for removal. Complete course of antibiotics as directed. See attached instructions for hospital return precautions.

## 2024-01-19 NOTE — ED Notes (Signed)
 wound irrigated; patient tolerated well.
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
## 2024-01-19 NOTE — ED Notes (Signed)
 Pt has approx 10 in lac on posterior of right calf, subcutaneus fat is showing. The posterior calf is ecchymotic. A large amount of skin on posterior calf is loose like a skin tear

## 2024-01-19 NOTE — ED Notes (Signed)
 wound sutured by PA; dressing placed with non-adherent Telfa & abdominal pad, wrapped with ACE wrap.
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
## 2024-01-23 ENCOUNTER — Ambulatory Visit (INDEPENDENT_AMBULATORY_CARE_PROVIDER_SITE_OTHER): Admitting: Student

## 2024-01-23 VITALS — BP 110/80 | HR 92 | Temp 98.6°F | Ht 68.0 in | Wt 144.6 lb

## 2024-01-23 DIAGNOSIS — S81811D Laceration without foreign body, right lower leg, subsequent encounter: Secondary | ICD-10-CM | POA: Diagnosis not present

## 2024-01-23 DIAGNOSIS — S81819A Laceration without foreign body, unspecified lower leg, initial encounter: Secondary | ICD-10-CM | POA: Insufficient documentation

## 2024-01-23 NOTE — Patient Instructions (Addendum)
 How to care for your wound Cleaning the wound Use a clean wash cloth to wipe away any dried blood if needed, but do not submerge your wound in water Dressing care Wash your hands with soap and water for at least 20 seconds before and after you change the dressing. If soap and water are not available, use hand sanitizer. Change your dressing as told by your health care provider. This may include: Cleaning or rinsing out (irrigating) the wound. Placing a dressing over the wound or in the wound (packing). Covering the wound with an outer dressing. Leave stitches (sutures) in place until 01/29/2024 and come back and see us . Checking for infection  Check your wound area every day for signs of infection. Check for: More redness, swelling, or pain. Fluid or blood. Warmth. Pus or a bad smell.

## 2024-01-23 NOTE — Assessment & Plan Note (Addendum)
 Stable appearing today No signs of acute infection/purulence/surrounding cellulitis Applied steri-strips to edges not well approximated, and changed dressing to clean dressing Discussed home wound care- keep area clean, dry, covered until follow up with us  on 4/28 with plan for suture removal at that time Provided with wound care supplies
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
## 2024-01-30 ENCOUNTER — Ambulatory Visit: Admitting: Student

## 2024-01-30 ENCOUNTER — Emergency Department (HOSPITAL_COMMUNITY)
Admission: EM | Admit: 2024-01-30 | Discharge: 2024-01-30 | Disposition: A | Discharge status: Home / Self Care | Attending: Emergency Medicine | Admitting: Emergency Medicine

## 2024-01-30 ENCOUNTER — Other Ambulatory Visit: Payer: Self-pay

## 2024-01-30 VITALS — BP 145/80 | HR 61 | Temp 98.6°F | Ht 68.0 in | Wt 147.2 lb

## 2024-01-30 DIAGNOSIS — Z79899 Other long term (current) drug therapy: Secondary | ICD-10-CM | POA: Diagnosis not present

## 2024-01-30 DIAGNOSIS — J45909 Unspecified asthma, uncomplicated: Secondary | ICD-10-CM | POA: Insufficient documentation

## 2024-01-30 DIAGNOSIS — I10 Essential (primary) hypertension: Secondary | ICD-10-CM | POA: Diagnosis not present

## 2024-01-30 DIAGNOSIS — S81809D Unspecified open wound, unspecified lower leg, subsequent encounter: Secondary | ICD-10-CM

## 2024-01-30 DIAGNOSIS — L03115 Cellulitis of right lower limb: Secondary | ICD-10-CM | POA: Insufficient documentation

## 2024-01-30 DIAGNOSIS — Z85528 Personal history of other malignant neoplasm of kidney: Secondary | ICD-10-CM | POA: Diagnosis not present

## 2024-01-30 DIAGNOSIS — M7989 Other specified soft tissue disorders: Secondary | ICD-10-CM | POA: Diagnosis present

## 2024-01-30 LAB — CBC WITH DIFFERENTIAL/PLATELET
Abs Immature Granulocytes: 0.07 10*3/uL (ref 0.00–0.07)
Basophils Absolute: 0 10*3/uL (ref 0.0–0.1)
Basophils Relative: 0 %
Eosinophils Absolute: 0.3 10*3/uL (ref 0.0–0.5)
Eosinophils Relative: 3 %
HCT: 35.2 % — ABNORMAL LOW (ref 39.0–52.0)
Hemoglobin: 11.5 g/dL — ABNORMAL LOW (ref 13.0–17.0)
Immature Granulocytes: 1 %
Lymphocytes Relative: 25 %
Lymphs Abs: 2.3 10*3/uL (ref 0.7–4.0)
MCH: 33.1 pg (ref 26.0–34.0)
MCHC: 32.7 g/dL (ref 30.0–36.0)
MCV: 101.4 fL — ABNORMAL HIGH (ref 80.0–100.0)
Monocytes Absolute: 1 10*3/uL (ref 0.1–1.0)
Monocytes Relative: 11 %
Neutro Abs: 5.3 10*3/uL (ref 1.7–7.7)
Neutrophils Relative %: 60 %
Platelets: 333 10*3/uL (ref 150–400)
RBC: 3.47 MIL/uL — ABNORMAL LOW (ref 4.22–5.81)
RDW: 13.9 % (ref 11.5–15.5)
WBC: 8.9 10*3/uL (ref 4.0–10.5)
nRBC: 0 % (ref 0.0–0.2)

## 2024-01-30 LAB — BASIC METABOLIC PANEL WITH GFR
Anion gap: 7 (ref 5–15)
BUN: 17 mg/dL (ref 8–23)
CO2: 24 mmol/L (ref 22–32)
Calcium: 8.8 mg/dL — ABNORMAL LOW (ref 8.9–10.3)
Chloride: 107 mmol/L (ref 98–111)
Creatinine, Ser: 1.1 mg/dL (ref 0.61–1.24)
GFR, Estimated: >60 mL/min (ref >60)
Glucose, Bld: 74 mg/dL (ref 70–99)
Potassium: 3.8 mmol/L (ref 3.5–5.1)
Sodium: 138 mmol/L (ref 135–145)

## 2024-01-30 MED ORDER — SODIUM CHLORIDE 0.9 % IV SOLN
2.0000 g | Freq: Once | INTRAVENOUS | Status: AC
  Administered 2024-01-30: 2 g via INTRAVENOUS
  Filled 2024-01-30: qty 20

## 2024-01-30 MED ORDER — VANCOMYCIN HCL 1500 MG/300ML IV SOLN
1500.0000 mg | Freq: Once | INTRAVENOUS | Status: AC
  Administered 2024-01-30: 1500 mg via INTRAVENOUS
  Filled 2024-01-30: qty 300

## 2024-01-30 MED ORDER — DOXYCYCLINE HYCLATE 100 MG PO CAPS: 100.0000 mg | ORAL_CAPSULE | Freq: Two times a day (BID) | ORAL | 0 refills | Status: DC

## 2024-01-30 NOTE — Assessment & Plan Note (Signed)
 Seems to have granulation tissue, unable to see wound care until middle of May. Patient has no systemic symptoms presently but with worsening pain from last visit and new erythema, with associated swelling that has not improved, will send to ED in case of IV abx need after failing outpatient treatment. Unsure if presence of necrotic tissue or crusting/protective layering on the wound. Differ to ED provided expertise if inpatient management or debridement is warranted. Discussed with patient and verbalized understanding, wife will come pick him up to take to the ED. This is reasonable given lack of systemic symptoms while hemodynamically stable. Called charge nurse who took report and updated inpatient team.

## 2024-01-30 NOTE — Discharge Instructions (Addendum)
 Follow-up in the next couple days if not improving.  If worsening or developing systemic systems return to the ER.  A culture has been sent and you should be notified if it does not have coverage with what we have been giving you.  Follow-up with the family practice doctors.

## 2024-01-30 NOTE — Patient Instructions (Signed)
 It was great to see you today! Thank you for choosing [facility] for your primary care.  Today we addressed We will be sending to ED  If you haven't already, sign up for My Chart to have easy access to your labs results, and communication with your primary care physician   Please arrive 15 minutes before your appointment to ensure smooth check in process.  We appreciate your efforts in making this happen.  Thank you for allowing me to participate in your care, Ernestina Headland, MD 01/30/2024, 9:36 AM PGY-3, [facility]
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
## 2024-01-30 NOTE — ED Provider Notes (Signed)
 [place] [facility] AT [facility] Provider Note   CSN: [id number] Arrival date & time: 01/30/24  8295     History  Chief Complaint  Patient presents with   Leg Pain   Wound Infection    Brian Leon is a 78 y.o. male.   Leg Pain Patient presents with wound infection of right lower leg.  Had been seen in the ER 11 days ago with laceration.  Had wound closed and given antibiotics.  Now more redness and swelling.  Went to his PCP and sent in for further evaluation.  No fevers or chills.  Has had more pain in that area.  Had been on Keflex  initially but not currently on antibiotics.    Past Medical History:  Diagnosis Date   Abdominal mass 03/14/2018   July 2018 CT: Fatty lesion within the left lateral abdominal musculature (obliques muscles/transversalis muscle) has increased slightly in size since 2015. Fatty lesion currently measures 10.4 x 4.7 x 4.6 cm versus 9.1 x 3.4 x 4.2 cm in 2015 and contains a few septations. This may represent a lipoma. Very low-grade liposarcoma cannot be entirely excluded.  Following with [facility]   Allergic rhinitis    Anemia    Arthritis    Asthma    PFT 2009 showed mod to severe with good reversibility with albuterol    Asthma, chronic 11/30/2006   Qualifier: Diagnosis of  By: Gae Jointer     BENIGN PROSTATIC HYPERTROPHY, HX OF 06/17/2008   Qualifier: Diagnosis of  By: Joni Net  MD, Stephanie     Bilateral ureteral calculi    Blind right eye    WEARS PROSTHESIS   C. difficile diarrhea 04/2022   Complication of anesthesia    "woke up before hernia surgery complete" 2012   Displacement of cervical intervertebral disc 03/27/2015   Dysphagia 03/25/2020   Eczema    Erectile dysfunction 10/10/2018   Essential (primary) hypertension 10/14/2019   Family history of adverse reaction to anesthesia    anesthesia made his mother "crazy"   Headache    due to neck pain   Hepatitis    hx of   Herniated nucleus  pulposus, lumbar 10/14/2019   History of bladder stone    History of irritable bowel syndrome    History of kidney stones    Hypercholesteremia 08/22/2018   Hypertension    Long term (current) use of systemic steroids 01/04/2018   Low back pain 09/09/2019   Lung nodule 05/21/2017   Need for immunization against influenza 08/05/2019   Non-recurrent unilateral inguinal hernia without obstruction or gangrene 12/13/2010   Right side    Nonhealing nonsurgical wound 07/03/2018   Osteoarthritis of spine with radiculopathy, 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   Osteoporosis    Paraureteric diverticulum    BILATERAL   Pneumonia    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   Prosthetic eye globe    right eye   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 Authorizing Provider  doxycycline  (VIBRAMYCIN ) 100 MG capsule Take 1 capsule (100 mg total) by mouth 2 (two) times daily. 01/30/24  Yes Mozell Arias, MD  albuterol  (VENTOLIN  HFA) 108 (90 Base) MCG/ACT inhaler Inhale 2 puffs into the lungs every 6 (six) hours as needed for wheezing or shortness of breath. 08/14/23   Albin Huh, MD  atorvastatin  (LIPITOR) 10 MG tablet TAKE 1 TABLET BY MOUTH EVERY DAY 12/07/23   Krasowski, Robert J, MD  cetirizine  (ZYRTEC  ALLERGY) 10 MG tablet Take 1 tablet (10 mg total) by mouth daily. Patient not taking: Reported on 11/10/2023 08/11/23   Albin Huh, MD  denosumab  (PROLIA ) 60 MG/ML SOSY injection Inject 60 mg into the skin every 6 (six) months. Deliver to clinic: [address], [place], [place] [address] by 12/05/23 11/27/23 11/26/24  Nicholas Bari, MD  DUPIXENT  300 MG/2ML SOSY Inject 300 mg into the skin every 14 (fourteen) days. Every other Monday 06/28/18   [provider]  finasteride  (PROSCAR ) 5 MG tablet Take 5 mg by mouth daily.    [provider]  fluticasone  (FLONASE ) 50 MCG/ACT nasal spray Place 2 sprays into both nostrils 2 (two) times daily. 11/17/23   Albin Huh, MD  fluticasone -salmeterol (ADVAIR  DISKUS) 500-50 MCG/ACT AEPB Inhale 1 puff into the lungs in the morning and at bedtime. 11/12/23   Albin Huh, MD  losartan  (COZAAR ) 50 MG tablet TAKE 1 TABLET BY MOUTH EVERY DAY 12/07/23   Krasowski, Robert J, MD  montelukast  (SINGULAIR ) 10 MG tablet Take 1 tablet (10 mg total) by mouth at bedtime. Patient not taking: Reported on 11/10/2023 08/11/23   Albin Huh, MD  pantoprazole  (PROTONIX ) 20 MG tablet Take 1 tablet (20 mg total) by mouth daily. 12/26/23   Albin Huh, MD  predniSONE  (DELTASONE ) 1 MG tablet Take 3 tablets (3 mg total) by mouth daily with breakfast. 01/12/24   Nicholas Bari, MD      Allergies    Sulfa drugs cross reactors and Acetaminophen     Review of Systems   Review of Systems  Physical Exam Updated Vital Signs BP 138/83   Pulse (!) 57   Temp 98.4 F (36.9 C)   Resp 16   SpO2 100%  Physical Exam Vitals and nursing note reviewed.  Skin:    Comments: Healing laceration/skin tear on right calf.  Does have erythema going down the leg.  Sutures in place.  No induration.  Really no warmth.  Neurological:     Mental Status: He is alert.     ED Results / Procedures / Treatments   Labs (all labs ordered are listed, but only abnormal results are displayed) Labs Reviewed  CBC WITH DIFFERENTIAL/PLATELET - Abnormal; Notable for the following components:      Result Value   RBC 3.47 (*)    Hemoglobin 11.5 (*)    HCT 35.2 (*)    MCV 101.4 (*)    All other components within normal limits  BASIC  METABOLIC PANEL WITH GFR - Abnormal; Notable for the following components:   Calcium  8.8 (*)    All other components within normal limits  AEROBIC CULTURE W GRAM STAIN (SUPERFICIAL SPECIMEN)    EKG None  Radiology No results found.  Procedures Procedures    Medications Ordered in ED Medications  vancomycin  (VANCOREADY) IVPB 1500 mg/300 mL (1,500 mg Intravenous New Bag/Given 01/30/24 1154)  cefTRIAXone (ROCEPHIN) 2 g in sodium chloride  0.9 % 100 mL IVPB (0 g Intravenous Stopped 01/30/24 1154)    ED Course/ Medical Decision  Making/ A&P                                 Medical Decision Making Amount and/or Complexity of Data Reviewed Labs: ordered.  Risk Prescription drug management.   Patient with wound right calf.  Recent laceration.  No worsening erythema.  Did have small area of swelling/blister.  It was drained with an 18-gauge needle.  Did have some purulent drainage and this was cultured.  No systemic symptoms and is only been on Keflex .  Get basic blood work.  Will give IV antibiotics and likely switch antibiotics  Sutures removed without complication.  Discussed with ED pharmacist.  Will give vancomycin  and ceftriaxone here and transition to doxycycline  as an outpatient.  White count is normal.  Well-appearing.  Has been given antibiotics.  Discharged home.        Final Clinical Impression(s) / ED Diagnoses Final diagnoses:  Cellulitis of right lower extremity    Rx / DC Orders ED Discharge Orders          Ordered    doxycycline  (VIBRAMYCIN ) 100 MG capsule  2 times daily        01/30/24 1320              Mozell Arias, MD 01/30/24 1341
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
## 2024-02-01 ENCOUNTER — Ambulatory Visit

## 2024-02-01 VITALS — Wt 140.0 lb

## 2024-02-01 DIAGNOSIS — Z0000 Encounter for general adult medical examination without abnormal findings: Secondary | ICD-10-CM

## 2024-02-01 LAB — AEROBIC CULTURE W GRAM STAIN (SUPERFICIAL SPECIMEN)
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
## 2024-02-01 NOTE — Patient Instructions (Signed)
 [name] , Thank you for taking time to come for your Medicare Wellness Visit. I appreciate your ongoing commitment to your health goals. Please review the following plan we discussed and let me know if I can assist you in the future.   Referrals/Orders/Follow-Ups/Clinician Recommendations: Keep maintaining your health by keeping your appointments with Dr.  Albin Huh and any specialists that you may see.  Call us  if you need anything.  Have a great year!!!!  This is a list of the screening recommended for you and due dates:  Health Maintenance  Topic Date Due   Zoster (Shingles) Vaccine (1 of 2) Never done   COVID-19 Vaccine (6 - 2024-25 season) 08/30/2023   Medicare Annual Wellness Visit  02/09/2024   Flu Shot  05/03/2024   DTaP/Tdap/Td vaccine (4 - Td or Tdap) 01/18/2034   Pneumonia Vaccine  Completed   HPV Vaccine  Aged Out   Meningitis B Vaccine  Aged Out   Colon Cancer Screening  Discontinued   Hepatitis C Screening  Discontinued    Advanced directives: (Declined) Advance directive discussed with you today. Even though you declined this today, please call our office should you change your mind, and we can give you the proper paperwork for you to fill out.  Next Medicare Annual Wellness Visit scheduled for next year: Yes  Have you seen your provider in the last 6 months (3 months if uncontrolled diabetes)? Yes

## 2024-02-02 ENCOUNTER — Telehealth (HOSPITAL_BASED_OUTPATIENT_CLINIC_OR_DEPARTMENT_OTHER): Payer: Self-pay | Admitting: Emergency Medicine

## 2024-02-02 NOTE — Telephone Encounter (Signed)
 Post ED Visit - Positive Culture Follow-up  Culture report reviewed by antimicrobial stewardship pharmacist: [facility] []  Court Distance, Pharm.D. []  Skeet Duke, Pharm.D., BCPS AQ-ID []  Leslee Rase, Pharm.D., BCPS []  Garland Junk, [address].D., BCPS []  [place], [place].D., BCPS, AAHIVP []  Alcide Aly, Pharm.D., BCPS, AAHIVP [x]  Jerri Morale, PharmD, BCPS []  Graham Laws, PharmD, BCPS []  Cleda Curly, PharmD, BCPS []  Tamar Fairly, PharmD []  Ballard Levels, PharmD, BCPS []  Ollen Beverage, PharmD  [facility] []  Arlyne Bering, PharmD []  Sherryle Don, PharmD []  Van Gelinas, PharmD []  Delila Felty, Rph []  [place]) Cleora Daft, PharmD []  Augustina Block, PharmD []  Arie Kurtz, PharmD []  Sharlyn Deaner, PharmD []  Agnes Hose, PharmD []  Kendall Pauls, PharmD []  Gladstone Lamer, PharmD []  Armanda Bern, PharmD []  Tera Fellows, PharmD   Positive aerobic culture Treated with Doxycycline , organism sensitive to the same and no further patient follow-up is required at this time.  Ellison Ha Janeal Abadi 02/02/2024, 1:31 PM

## 2024-02-06 ENCOUNTER — Encounter: Attending: Physician Assistant | Admitting: Physician Assistant

## 2024-02-06 DIAGNOSIS — S81811A Laceration without foreign body, right lower leg, initial encounter: Secondary | ICD-10-CM | POA: Diagnosis not present

## 2024-02-06 DIAGNOSIS — I10 Essential (primary) hypertension: Secondary | ICD-10-CM | POA: Insufficient documentation

## 2024-02-06 DIAGNOSIS — L97812 Non-pressure chronic ulcer of other part of right lower leg with fat layer exposed: Secondary | ICD-10-CM | POA: Diagnosis not present

## 2024-02-06 DIAGNOSIS — L97212 Non-pressure chronic ulcer of right calf with fat layer exposed: Secondary | ICD-10-CM | POA: Diagnosis not present

## 2024-02-07 DIAGNOSIS — S81811A Laceration without foreign body, right lower leg, initial encounter: Secondary | ICD-10-CM | POA: Diagnosis not present

## 2024-02-07 DIAGNOSIS — I10 Essential (primary) hypertension: Secondary | ICD-10-CM | POA: Diagnosis not present

## 2024-02-07 DIAGNOSIS — L97812 Non-pressure chronic ulcer of other part of right lower leg with fat layer exposed: Secondary | ICD-10-CM | POA: Diagnosis not present

## 2024-02-07 DIAGNOSIS — L97822 Non-pressure chronic ulcer of other part of left lower leg with fat layer exposed: Secondary | ICD-10-CM | POA: Diagnosis not present

## 2024-02-07 DIAGNOSIS — L97222 Non-pressure chronic ulcer of left calf with fat layer exposed: Secondary | ICD-10-CM | POA: Diagnosis not present

## 2024-02-13 ENCOUNTER — Encounter: Admitting: Physician Assistant

## 2024-02-13 DIAGNOSIS — L97212 Non-pressure chronic ulcer of right calf with fat layer exposed: Secondary | ICD-10-CM | POA: Diagnosis not present

## 2024-02-13 DIAGNOSIS — L97222 Non-pressure chronic ulcer of left calf with fat layer exposed: Secondary | ICD-10-CM | POA: Diagnosis not present

## 2024-02-13 DIAGNOSIS — I10 Essential (primary) hypertension: Secondary | ICD-10-CM | POA: Diagnosis not present

## 2024-02-13 DIAGNOSIS — L97812 Non-pressure chronic ulcer of other part of right lower leg with fat layer exposed: Secondary | ICD-10-CM | POA: Diagnosis not present

## 2024-02-13 DIAGNOSIS — S81811A Laceration without foreign body, right lower leg, initial encounter: Secondary | ICD-10-CM | POA: Diagnosis not present

## 2024-02-13 DIAGNOSIS — L97822 Non-pressure chronic ulcer of other part of left lower leg with fat layer exposed: Secondary | ICD-10-CM | POA: Diagnosis not present

## 2024-02-20 ENCOUNTER — Encounter: Admitting: Physician Assistant

## 2024-02-20 DIAGNOSIS — S81811A Laceration without foreign body, right lower leg, initial encounter: Secondary | ICD-10-CM | POA: Diagnosis not present

## 2024-02-20 DIAGNOSIS — L97212 Non-pressure chronic ulcer of right calf with fat layer exposed: Secondary | ICD-10-CM | POA: Diagnosis not present

## 2024-02-20 DIAGNOSIS — L97812 Non-pressure chronic ulcer of other part of right lower leg with fat layer exposed: Secondary | ICD-10-CM | POA: Diagnosis not present

## 2024-02-20 DIAGNOSIS — I10 Essential (primary) hypertension: Secondary | ICD-10-CM | POA: Diagnosis not present

## 2024-02-23 ENCOUNTER — Telehealth: Payer: Self-pay | Admitting: Cardiology

## 2024-02-23 NOTE — Telephone Encounter (Signed)
 STAT if patient feels like he/she is going to faint   Are you dizzy, lightheaded, or faint now? No   Have you passed out? No IF YES MOVE TO .SYNCOPECVD  Do you have any other symptoms? No   Have you checked your HR and BP (record if available)? 158/91   Pt states he is leaving out of town in a week and concerned about being dizzy. He states he gets dizzy when uses the bathroom and "when he is holding his breath". Please advise.

## 2024-02-23 NOTE — Telephone Encounter (Signed)
 Pt states that the dizziness episodes have only been a few times and resolves once he sits down. Episodes have happened when having a BM and again when he bent over. Pt states he has been staying hydrated and has not had chest pain, blood in stool, nausea or vomiting. Pt states he is going to [place] June 2 and wanted to make sure he does not need to do anything before leaving. Pt states that his BP has been 145/88 and 158/90 HR 78-80. Please advise

## 2024-02-27 ENCOUNTER — Encounter: Admitting: Physician Assistant

## 2024-02-27 DIAGNOSIS — L97812 Non-pressure chronic ulcer of other part of right lower leg with fat layer exposed: Secondary | ICD-10-CM | POA: Diagnosis not present

## 2024-02-27 DIAGNOSIS — L97222 Non-pressure chronic ulcer of left calf with fat layer exposed: Secondary | ICD-10-CM | POA: Diagnosis not present

## 2024-02-27 DIAGNOSIS — I10 Essential (primary) hypertension: Secondary | ICD-10-CM | POA: Diagnosis not present

## 2024-02-27 DIAGNOSIS — L97822 Non-pressure chronic ulcer of other part of left lower leg with fat layer exposed: Secondary | ICD-10-CM | POA: Diagnosis not present

## 2024-02-27 DIAGNOSIS — L97212 Non-pressure chronic ulcer of right calf with fat layer exposed: Secondary | ICD-10-CM | POA: Diagnosis not present

## 2024-02-27 DIAGNOSIS — S81811A Laceration without foreign body, right lower leg, initial encounter: Secondary | ICD-10-CM | POA: Diagnosis not present

## 2024-02-28 NOTE — Telephone Encounter (Signed)
 5/25 128/87 HR 105 5/26 121/76 HR 81 5/27 128/76 HR 74 5/28 140/78 HR 71  Pt states that the dizziness is only when he bends over, holds his breath or is having a BM.

## 2024-02-29 ENCOUNTER — Ambulatory Visit (HOSPITAL_BASED_OUTPATIENT_CLINIC_OR_DEPARTMENT_OTHER): Admitting: Internal Medicine

## 2024-03-01 DIAGNOSIS — L97822 Non-pressure chronic ulcer of other part of left lower leg with fat layer exposed: Secondary | ICD-10-CM | POA: Diagnosis not present

## 2024-03-01 DIAGNOSIS — L97812 Non-pressure chronic ulcer of other part of right lower leg with fat layer exposed: Secondary | ICD-10-CM | POA: Diagnosis not present

## 2024-03-01 DIAGNOSIS — S81811A Laceration without foreign body, right lower leg, initial encounter: Secondary | ICD-10-CM | POA: Diagnosis not present

## 2024-03-01 DIAGNOSIS — L97222 Non-pressure chronic ulcer of left calf with fat layer exposed: Secondary | ICD-10-CM | POA: Diagnosis not present

## 2024-03-01 DIAGNOSIS — I10 Essential (primary) hypertension: Secondary | ICD-10-CM | POA: Diagnosis not present

## 2024-03-04 NOTE — Telephone Encounter (Signed)
 Information was discussed at last call

## 2024-03-27 DIAGNOSIS — S42031A Displaced fracture of lateral end of right clavicle, initial encounter for closed fracture: Secondary | ICD-10-CM | POA: Diagnosis not present

## 2024-03-27 DIAGNOSIS — M25511 Pain in right shoulder: Secondary | ICD-10-CM | POA: Diagnosis not present

## 2024-03-28 ENCOUNTER — Ambulatory Visit (HOSPITAL_COMMUNITY)
Admission: RE | Admit: 2024-03-28 | Discharge: 2024-03-28 | Disposition: A | Discharge status: Home / Self Care | Source: Ambulatory Visit | Attending: Family Medicine | Admitting: Family Medicine

## 2024-03-28 ENCOUNTER — Ambulatory Visit (INDEPENDENT_AMBULATORY_CARE_PROVIDER_SITE_OTHER): Admitting: Family Medicine

## 2024-03-28 ENCOUNTER — Telehealth: Payer: Self-pay

## 2024-03-28 VITALS — BP 123/74 | HR 77 | Ht 68.0 in | Wt 149.6 lb

## 2024-03-28 DIAGNOSIS — S0990XA Unspecified injury of head, initial encounter: Secondary | ICD-10-CM
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
## 2024-03-28 NOTE — Progress Notes (Signed)
    SUBJECTIVE:   CHIEF COMPLAINT / HPI:   Fall Occurred on Saturday in [place]. He was dx with clavicle fracture; ortho reported no surgery needed. He did hit his head. He has been having dizziness and nausea without vomiting. He has also had slurred speech per wife, though he does not remember this. He has been having balance issues, though these happened before [place]. He has been taking Advil  for pain after the clavicle fracture, but he is not taking other blood thinners.  PERTINENT  PMH / PSH: Thoracic aortic aneurysm, hypertension, allergies, lumbar radiculopathy, arthritis, history of renal cell carcinoma  OBJECTIVE:   BP 123/74   Pulse 77   Ht 5' 8 (1.727 m)   Wt 149 lb 9.6 oz (67.9 kg)   SpO2 97%   BMI 22.75 kg/m   General: Alert and oriented, in NAD Skin: Warm, dry, and intact; subcutaneous edema overlying clavicular fracture site on R, resolving but extensive bruising overlying R upper chest without frank hematoma HEENT: NCAT, EOM grossly normal, midline nasal septum Respiratory: Breathing and speaking comfortably on RAs Extremities: Moves all extremities grossly equally Neurological: No gross focal deficit, strength and sensation fully intact throughout, CN II-XII intact, gait normal, speech normal Psychiatric: Appropriate mood and affect   ASSESSMENT/PLAN:   Assessment & Plan Traumatic injury of head, initial encounter Associated with worrisome symptoms of nausea (though without vomiting), dizziness, transient mental status/speech changes. Given these symptoms and his age over 66, he is NEXUS high risk. STAT head CT has been ordered. Discussed returning to ED if worsening before results are delivered. Recommended also tylenol , ibuprofen , rest, and hydration in the meantime.   Stuart Redo, MD [facility]

## 2024-03-28 NOTE — Patient Instructions (Signed)
 We will get your stat CT head scheduled. If you have worsening symptoms before this results, I recommend going to the emergency department immediately. In the meantime, be sure to stay hydrated and rest well.

## 2024-03-29 ENCOUNTER — Ambulatory Visit: Payer: Self-pay | Admitting: Family Medicine

## 2024-04-01 ENCOUNTER — Other Ambulatory Visit: Payer: Self-pay | Admitting: Family Medicine

## 2024-04-01 ENCOUNTER — Encounter: Attending: Physician Assistant | Admitting: Physician Assistant

## 2024-04-01 DIAGNOSIS — S81811A Laceration without foreign body, right lower leg, initial encounter: Secondary | ICD-10-CM | POA: Diagnosis not present

## 2024-04-01 DIAGNOSIS — R058 Other specified cough: Secondary | ICD-10-CM

## 2024-04-01 DIAGNOSIS — W228XXA Striking against or struck by other objects, initial encounter: Secondary | ICD-10-CM | POA: Insufficient documentation

## 2024-04-01 DIAGNOSIS — L97812 Non-pressure chronic ulcer of other part of right lower leg with fat layer exposed: Secondary | ICD-10-CM | POA: Diagnosis not present

## 2024-04-01 DIAGNOSIS — L97212 Non-pressure chronic ulcer of right calf with fat layer exposed: Secondary | ICD-10-CM | POA: Diagnosis not present

## 2024-04-01 NOTE — Telephone Encounter (Signed)
 Patient returns call to nurse line regarding imaging results.   Patient endorses sinus congestion, runny nose and cough for the last 5-7 days. He states that he forgot to mention this in last visit due to other concerns.   Patient is requesting prescription for abx.   Preferred pharmacy is [facility] [address].   Please advise.   Chiquita JAYSON English, RN

## 2024-04-02 MED ORDER — AMOXICILLIN 875 MG PO TABS
875.0000 mg | ORAL_TABLET | Freq: Two times a day (BID) | ORAL | 0 refills | Status: DC
Start: 2024-04-02 — End: 2024-04-02

## 2024-04-02 MED ORDER — AMOXICILLIN-POT CLAVULANATE 875-125 MG PO TABS: 1.0000 | ORAL_TABLET | Freq: Two times a day (BID) | ORAL | 0 refills | Status: DC

## 2024-04-02 NOTE — Telephone Encounter (Addendum)
 Attempted to discuss sinus congestion, runny nose, cough, and evidence of sinusitis on CT head; however, patient did not answer. VM left to call back.  Given his age, recent antibiotic use, and multiple comorbid conditions, I have sent in Augmentin  for 5 days. Please advise patient this is available at his pharmacy when he calls back and to let us  know if he is not improving in 48 hours.

## 2024-04-02 NOTE — Telephone Encounter (Signed)
 Discussed with patients significant other Hope.   She reports he is at the pharmacy now to pick up medication.   Advised to let us  know if no improvement in 2 days.   All questions answered.

## 2024-04-08 ENCOUNTER — Encounter: Attending: Physician Assistant

## 2024-04-08 DIAGNOSIS — L97812 Non-pressure chronic ulcer of other part of right lower leg with fat layer exposed: Secondary | ICD-10-CM | POA: Diagnosis not present

## 2024-04-08 DIAGNOSIS — W228XXA Striking against or struck by other objects, initial encounter: Secondary | ICD-10-CM | POA: Insufficient documentation

## 2024-04-08 DIAGNOSIS — S81811A Laceration without foreign body, right lower leg, initial encounter: Secondary | ICD-10-CM | POA: Diagnosis not present

## 2024-04-08 DIAGNOSIS — I10 Essential (primary) hypertension: Secondary | ICD-10-CM | POA: Insufficient documentation

## 2024-04-11 ENCOUNTER — Other Ambulatory Visit: Payer: Self-pay | Admitting: Rheumatology

## 2024-04-11 NOTE — Telephone Encounter (Signed)
 Last Fill: 01/12/2024  Next Visit: 05/09/2024  Last Visit: 11/10/2023  Dx: Age-related osteoporosis without current pathological fracture   Current Dose per office note on 11/10/2023: prednisone  3 mg p.o. daily   Okay to refill Prednisone ?

## 2024-04-16 ENCOUNTER — Ambulatory Visit (INDEPENDENT_AMBULATORY_CARE_PROVIDER_SITE_OTHER): Admitting: Family Medicine

## 2024-04-16 ENCOUNTER — Encounter: Admitting: Physician Assistant

## 2024-04-16 VITALS — BP 142/89 | HR 81 | Ht 68.0 in | Wt 142.0 lb

## 2024-04-16 DIAGNOSIS — L97212 Non-pressure chronic ulcer of right calf with fat layer exposed: Secondary | ICD-10-CM | POA: Diagnosis not present

## 2024-04-16 DIAGNOSIS — R42 Dizziness and giddiness: Secondary | ICD-10-CM

## 2024-04-16 DIAGNOSIS — I10 Essential (primary) hypertension: Secondary | ICD-10-CM | POA: Diagnosis not present

## 2024-04-16 DIAGNOSIS — L97812 Non-pressure chronic ulcer of other part of right lower leg with fat layer exposed: Secondary | ICD-10-CM | POA: Diagnosis not present

## 2024-04-16 DIAGNOSIS — S81811A Laceration without foreign body, right lower leg, initial encounter: Secondary | ICD-10-CM | POA: Diagnosis not present

## 2024-04-16 NOTE — Progress Notes (Signed)
    SUBJECTIVE:   CHIEF COMPLAINT / HPI:   DIZZINESS Had a fall approximately 3 weeks ago and hit head; stat head CT was without acute abnormality, though did show possible sinusitis. He has since completed abx course without any change in symptoms. Tramadol  q8h, he does not know the dose for clavicle fracture from fall. Dizziness started since his fall and comes and goes in episodes every few days. Once while he was straining on toilet, another time when he was outside doing yard work/playing with grandson. Another episode when he was outside not exerting himself and then went inside with cool AC. Denies heart racing/palpitations, chest pain/pressure, difficulty breathing, nausea/vomiting, headache, vision or hearing changes. No unsteady gait or weakness. No memory changes or speech difficulty. Accompanied by wife and she agrees with these symptoms.   PERTINENT  PMH / PSH: Reviewed. HTN  OBJECTIVE:   BP (!) 142/89   Pulse 81   Ht 5' 8 (1.727 m)   Wt 142 lb (64.4 kg)   SpO2 98%   BMI 21.59 kg/m   General: Well-appearing, no acute distress. HEENT: normocephalic, PERRLA, EOM grossly intact, MMM, bilateral TM visualized without erythema or bulging. Cardio: Regular rate, regular rhythm, no murmurs on exam. Pulm: Clear, no wheezing, no crackles. No increased work of breathing. Extremities: no peripheral edema. Moves all extremities equally. Neuro: Alert and oriented x3, speech normal in content, no facial asymmetry. Psych:  Cognition and judgment appear intact. Alert, communicative, and cooperative with normal attention span and concentration.   ASSESSMENT/PLAN:   Assessment & Plan Dizziness Given the timeline of events I suspect this dizziness is most related to his tramadol  usage.  I have very low concern for CNS causes (seizures, brain mass) particularly given benign head CT recently; he does have known ascending thoracic aortic aneurysm which has been stable on repeat  imaging and followed by cardiology; I have low concern for progression. - As pain allows he will decrease his tramadol  usage with the goal to be once a day and then not at all - He will follow-up in 1 week so we can reassess symptoms/episodes - He will maintain adequate hydration and avoid exerting himself unnecessarily - He will keep his follow-up appointment with cardiology scheduled for next month   Lauraine Norse, DO [facility]

## 2024-04-16 NOTE — Patient Instructions (Addendum)
 It was so good to see you today! Thank you for allowing me to take care of you.  Today we discussed the following concerns and plans:  Dizziness - I think this is most likely related to your tramadol , and since your pain is improving I would like you to decrease your tramadol  usage. - Try taking your tramadol  only when you have pain, with the goal being to decrease to once a day, and then not taking tramadol  at all. - Follow-up in 1 week so we can see if your symptoms are improving.  If you have any concerns, please call the clinic or schedule an appointment.  It was a pleasure to take care of you today. Be well!  Lauraine Norse, DO [facility], PGY-2  Don't forget to check out the [facility] in [facility] at [address] [phone] Affordable prices on prescriptions and over-the-counter items, as well as services like vaccinations and medication home delivery.

## 2024-04-16 NOTE — Assessment & Plan Note (Addendum)
 Given the timeline of events I suspect this dizziness is most related to his tramadol  usage.  I have very low concern for CNS causes (seizures, brain mass) particularly given benign head CT recently; he does have known ascending thoracic aortic aneurysm which has been stable on repeat imaging and followed by cardiology; I have low concern for progression. - As pain allows he will decrease his tramadol  usage with the goal to be once a day and then not at all - He will follow-up in 1 week so we can reassess symptoms/episodes - He will maintain adequate hydration and avoid exerting himself unnecessarily - He will keep his follow-up appointment with cardiology scheduled for next month

## 2024-04-17 DIAGNOSIS — M25511 Pain in right shoulder: Secondary | ICD-10-CM | POA: Diagnosis not present

## 2024-04-18 DIAGNOSIS — L97812 Non-pressure chronic ulcer of other part of right lower leg with fat layer exposed: Secondary | ICD-10-CM | POA: Diagnosis not present

## 2024-04-18 DIAGNOSIS — I10 Essential (primary) hypertension: Secondary | ICD-10-CM | POA: Diagnosis not present

## 2024-04-18 DIAGNOSIS — S81811A Laceration without foreign body, right lower leg, initial encounter: Secondary | ICD-10-CM | POA: Diagnosis not present

## 2024-04-18 DIAGNOSIS — L97222 Non-pressure chronic ulcer of left calf with fat layer exposed: Secondary | ICD-10-CM | POA: Diagnosis not present

## 2024-04-18 DIAGNOSIS — L97822 Non-pressure chronic ulcer of other part of left lower leg with fat layer exposed: Secondary | ICD-10-CM | POA: Diagnosis not present

## 2024-04-18 DIAGNOSIS — L2084 Intrinsic (allergic) eczema: Secondary | ICD-10-CM | POA: Diagnosis not present

## 2024-04-23 ENCOUNTER — Encounter: Admitting: Physician Assistant

## 2024-04-23 DIAGNOSIS — I10 Essential (primary) hypertension: Secondary | ICD-10-CM | POA: Diagnosis not present

## 2024-04-23 DIAGNOSIS — S81811A Laceration without foreign body, right lower leg, initial encounter: Secondary | ICD-10-CM | POA: Diagnosis not present

## 2024-04-23 DIAGNOSIS — L97812 Non-pressure chronic ulcer of other part of right lower leg with fat layer exposed: Secondary | ICD-10-CM | POA: Diagnosis not present

## 2024-04-23 DIAGNOSIS — L97212 Non-pressure chronic ulcer of right calf with fat layer exposed: Secondary | ICD-10-CM | POA: Diagnosis not present

## 2024-04-25 ENCOUNTER — Ambulatory Visit: Admitting: Student

## 2024-04-25 ENCOUNTER — Encounter: Payer: Self-pay | Admitting: Student

## 2024-04-25 VITALS — BP 103/73 | HR 80 | Ht 68.0 in | Wt 143.6 lb

## 2024-04-25 DIAGNOSIS — R42 Dizziness and giddiness: Secondary | ICD-10-CM | POA: Diagnosis not present

## 2024-04-25 NOTE — Patient Instructions (Signed)
 It was great to see you! Thank you for allowing me to participate in your care!   I recommend that you always bring your medications to each appointment as this makes it easy to ensure we are on the correct medications and helps us  not miss when refills are needed.  Our plans for today:  - Follow-up in 01/2025 for annual exam or sooner if needed  Take care and seek immediate care sooner if you develop any concerns. Please remember to show up 15 minutes before your scheduled appointment time!  Gladis Church, DO [facility]

## 2024-04-25 NOTE — Assessment & Plan Note (Signed)
 Resolved.  Discussed return precautions. - If returns consider EKG with Zio patch, echocardiogram, and blood work

## 2024-04-25 NOTE — Progress Notes (Signed)
    SUBJECTIVE:   CHIEF COMPLAINT / HPI:   Dizziness Resolved since stopping tramadol .  No dizziness for 1 week.  No headaches, vision change, palpitations, chest pain, shortness of breath, weakness.  Spouse confirms no symptoms of stroke or other neurologic disorder.  Overall he feels very well today.  OBJECTIVE:   BP 103/73   Pulse 80   Ht 5' 8 (1.727 m)   Wt 143 lb 9.6 oz (65.1 kg)   BMI 21.83 kg/m    General: NAD, pleasant HEENT: Normocephalic, atraumatic head. Cardio: RRR, no MRG. Cap Refill <2s. Respiratory: CTAB, normal wob on RA Skin: Warm and dry Neuro: CN II: PERRL (of left eye), plastic RIGHT eye CN III, IV,VI: EOMI (of left eye), plastic RIGHT eye CV V: Normal sensation in V1, V2, V3 CVII: Symmetric smile and brow raise CN VIII: Normal hearing CN IX,X: Symmetric palate raise  CN XI: 5/5 shoulder shrug CN XII: Symmetric tongue protrusion  UE and LE strength 5/5 Normal sensation in UE and LE bilaterally  No ataxia with finger to nose  ASSESSMENT/PLAN:   Assessment & Plan Dizziness Resolved.  Discussed return precautions. - If returns consider EKG with Zio patch, echocardiogram, and blood work   Follow-up recommendations Follow-up in approximately 1 year for annual exam, or sooner if needed  Gladis Church, DO [facility]
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
## 2024-04-30 ENCOUNTER — Encounter: Admitting: Physician Assistant

## 2024-04-30 DIAGNOSIS — I10 Essential (primary) hypertension: Secondary | ICD-10-CM | POA: Diagnosis not present

## 2024-04-30 DIAGNOSIS — L97812 Non-pressure chronic ulcer of other part of right lower leg with fat layer exposed: Secondary | ICD-10-CM | POA: Diagnosis not present

## 2024-04-30 DIAGNOSIS — L97212 Non-pressure chronic ulcer of right calf with fat layer exposed: Secondary | ICD-10-CM | POA: Diagnosis not present

## 2024-04-30 DIAGNOSIS — S81811A Laceration without foreign body, right lower leg, initial encounter: Secondary | ICD-10-CM | POA: Diagnosis not present

## 2024-05-01 DIAGNOSIS — L97812 Non-pressure chronic ulcer of other part of right lower leg with fat layer exposed: Secondary | ICD-10-CM | POA: Diagnosis not present

## 2024-05-01 DIAGNOSIS — L97822 Non-pressure chronic ulcer of other part of left lower leg with fat layer exposed: Secondary | ICD-10-CM | POA: Diagnosis not present

## 2024-05-01 DIAGNOSIS — S81811A Laceration without foreign body, right lower leg, initial encounter: Secondary | ICD-10-CM | POA: Diagnosis not present

## 2024-05-01 DIAGNOSIS — I10 Essential (primary) hypertension: Secondary | ICD-10-CM | POA: Diagnosis not present

## 2024-05-07 ENCOUNTER — Encounter: Attending: Physician Assistant | Admitting: Physician Assistant

## 2024-05-07 DIAGNOSIS — L97212 Non-pressure chronic ulcer of right calf with fat layer exposed: Secondary | ICD-10-CM | POA: Diagnosis not present

## 2024-05-07 DIAGNOSIS — L97812 Non-pressure chronic ulcer of other part of right lower leg with fat layer exposed: Secondary | ICD-10-CM | POA: Insufficient documentation

## 2024-05-07 DIAGNOSIS — H2512 Age-related nuclear cataract, left eye: Secondary | ICD-10-CM | POA: Diagnosis not present

## 2024-05-07 DIAGNOSIS — S81811A Laceration without foreign body, right lower leg, initial encounter: Secondary | ICD-10-CM | POA: Diagnosis not present

## 2024-05-07 DIAGNOSIS — S40811D Abrasion of right upper arm, subsequent encounter: Secondary | ICD-10-CM | POA: Diagnosis not present

## 2024-05-07 DIAGNOSIS — I10 Essential (primary) hypertension: Secondary | ICD-10-CM | POA: Diagnosis not present

## 2024-05-07 DIAGNOSIS — H43812 Vitreous degeneration, left eye: Secondary | ICD-10-CM | POA: Diagnosis not present

## 2024-05-07 DIAGNOSIS — Z970 Presence of artificial eye: Secondary | ICD-10-CM | POA: Diagnosis not present

## 2024-05-07 DIAGNOSIS — H353122 Nonexudative age-related macular degeneration, left eye, intermediate dry stage: Secondary | ICD-10-CM | POA: Diagnosis not present

## 2024-05-09 ENCOUNTER — Encounter: Payer: Self-pay | Admitting: Rheumatology

## 2024-05-09 ENCOUNTER — Ambulatory Visit: Payer: Medicare HMO | Attending: Rheumatology | Admitting: Rheumatology

## 2024-05-09 VITALS — BP 122/85 | HR 71 | Resp 14 | Ht 68.0 in | Wt 145.0 lb

## 2024-05-09 DIAGNOSIS — E559 Vitamin D deficiency, unspecified: Secondary | ICD-10-CM | POA: Diagnosis not present

## 2024-05-09 DIAGNOSIS — Z9181 History of falling: Secondary | ICD-10-CM

## 2024-05-09 DIAGNOSIS — M19041 Primary osteoarthritis, right hand: Secondary | ICD-10-CM | POA: Diagnosis not present

## 2024-05-09 DIAGNOSIS — A498 Other bacterial infections of unspecified site: Secondary | ICD-10-CM

## 2024-05-09 DIAGNOSIS — Z7952 Long term (current) use of systemic steroids: Secondary | ICD-10-CM | POA: Diagnosis not present

## 2024-05-09 DIAGNOSIS — Z8781 Personal history of (healed) traumatic fracture: Secondary | ICD-10-CM

## 2024-05-09 DIAGNOSIS — M24522 Contracture, left elbow: Secondary | ICD-10-CM | POA: Diagnosis not present

## 2024-05-09 DIAGNOSIS — Z872 Personal history of diseases of the skin and subcutaneous tissue: Secondary | ICD-10-CM

## 2024-05-09 DIAGNOSIS — Z970 Presence of artificial eye: Secondary | ICD-10-CM

## 2024-05-09 DIAGNOSIS — M810 Age-related osteoporosis without current pathological fracture: Secondary | ICD-10-CM | POA: Diagnosis not present

## 2024-05-09 DIAGNOSIS — Z131 Encounter for screening for diabetes mellitus: Secondary | ICD-10-CM | POA: Diagnosis not present

## 2024-05-09 DIAGNOSIS — I10 Essential (primary) hypertension: Secondary | ICD-10-CM

## 2024-05-09 DIAGNOSIS — Z5181 Encounter for therapeutic drug level monitoring: Secondary | ICD-10-CM

## 2024-05-09 DIAGNOSIS — R911 Solitary pulmonary nodule: Secondary | ICD-10-CM

## 2024-05-09 DIAGNOSIS — Z862 Personal history of diseases of the blood and blood-forming organs and certain disorders involving the immune mechanism: Secondary | ICD-10-CM

## 2024-05-09 DIAGNOSIS — N132 Hydronephrosis with renal and ureteral calculous obstruction: Secondary | ICD-10-CM

## 2024-05-09 DIAGNOSIS — M353 Polymyalgia rheumatica: Secondary | ICD-10-CM

## 2024-05-09 DIAGNOSIS — M5030 Other cervical disc degeneration, unspecified cervical region: Secondary | ICD-10-CM

## 2024-05-09 DIAGNOSIS — Z9889 Other specified postprocedural states: Secondary | ICD-10-CM

## 2024-05-09 DIAGNOSIS — S42001D Fracture of unspecified part of right clavicle, subsequent encounter for fracture with routine healing: Secondary | ICD-10-CM

## 2024-05-09 DIAGNOSIS — M19042 Primary osteoarthritis, left hand: Secondary | ICD-10-CM

## 2024-05-09 NOTE — Patient Instructions (Addendum)
 Please take calcium  1200 mg daily between diet and supplement.  Please take vitamin D  2000 units daily.  Exercises for Chronic Knee Pain Chronic knee pain is pain that lasts longer than 3 months. For most people with chronic knee pain, exercise and weight loss is an important part of treatment. Your health care provider may want you to focus on: Making the muscles that support your knee stronger. This can take pressure off your knee and reduce pain. Preventing knee stiffness. How far you can move your knee, keeping it there or making it farther. Losing weight (if this applies) to take pressure off your knee, lower your risk for injury, and make it easier for you to exercise. Your provider will help you make an exercise program that fits your needs and physical abilities. Below are simple, low-impact exercises you can do at home. Ask your provider or physical therapist how often you should do your exercise program and how many times to repeat each exercise. General safety tips  Get your provider's approval before doing any exercises. Start slowly and stop any time you feel pain. Do not exercise if your knee pain is flaring up. Warm up first. Stretching a cold muscle can cause an injury. Do 5-10 minutes of easy movement or light stretching before beginning your exercises. Do 5-10 minutes of low-impact activity (like walking or cycling) before starting strengthening exercises. Contact your provider any time you have pain during or after exercising. Exercise can cause discomfort but should not be painful. It is normal to be a little stiff or sore after exercising. Stretching and range-of-motion exercises Front thigh stretch  Stand up straight and support your body by holding on to a chair or resting one hand on a wall. With your legs straight and close together, bend one knee to lift your heel up toward your butt. Using one hand for support, grab your ankle with your free hand. Pull your foot up  closer toward your butt to feel the stretch in front of your thigh. Hold the stretch for 30 seconds. Repeat __________ times. Complete this exercise __________ times a day. Back thigh stretch  Sit on the floor with your back straight and your legs out straight in front of you. Place the palms of your hands on the floor and slide them toward your feet as you bend at the hip. Try to touch your nose to your knees and feel the stretch in the back of your thighs. Hold for 30 seconds. Repeat __________ times. Complete this exercise __________ times a day. Calf stretch  Stand facing a wall. Place the palms of your hands flat against the wall, arms extended, and lean slightly against the wall. Get into a lunge position with one leg bent at the knee and the other leg stretched out straight behind you. Keep both feet facing the wall and increase the bend in your knee while keeping the heel of the other leg flat on the ground. You should feel the stretch in your calf. Hold for 30 seconds. Repeat __________ times. Complete this exercise __________ times a day. Strengthening exercises Straight leg lift  Lie on your back with one knee bent and the other leg out straight. Slowly lift the straight leg without bending the knee. Lift until your foot is about 12 inches (30 cm) off the floor. Hold for 3-5 seconds and slowly lower your leg. Repeat __________ times. Complete this exercise __________ times a day. Single leg dip  Stand between two chairs and  put both hands on the backs of the chairs for support. Extend one leg out straight with your body weight resting on the heel of the standing leg. Slowly bend your standing knee to dip your body to the level that is comfortable for you. Hold for 3-5 seconds. Repeat __________ times. Complete this exercise __________ times a day. Hamstring curls  Stand straight, knees close together, facing the back of a chair. Hold on to the back of a chair with both  hands. Keep one leg straight. Bend the other knee while bringing the heel up toward the butt until the knee is bent at a 90-degree angle (right angle). Hold for 3-5 seconds. Repeat __________ times. Complete this exercise __________ times a day. Wall squat  Stand straight with your back, hips, and head against a wall. Step forward one foot at a time with your back still against the wall. Your feet should be 2 feet (61 cm) from the wall at shoulder width. Keeping your back, hips, and head against the wall, slide down the wall to as close to a sitting position as you can get. Hold for 5-10 seconds, then slowly slide back up. Repeat __________ times. Complete this exercise __________ times a day. Step-ups  Stand in front of a sturdy platform or stool that is about 6 inches (15 cm) high. Slowly step up with your left / right foot, keeping your knee in line with your hip and foot. Do not let your knee bend so far that you cannot see your toes. Hold on to a chair for balance, but do not use it for support. Slowly unlock your knee and lower yourself to the starting position. Repeat __________ times. Complete this exercise __________ times a day. Contact a health care provider if: Your exercises cause pain. Your pain is worse after you exercise. Your pain prevents you from doing your exercises. This information is not intended to replace advice given to you by your health care provider. Make sure you discuss any questions you have with your health care provider. Document Revised: 10/04/2022 Document Reviewed: 10/04/2022 Elsevier Patient Education  2024 [employer].

## 2024-05-10 ENCOUNTER — Ambulatory Visit: Payer: Self-pay | Admitting: Rheumatology

## 2024-05-10 LAB — COMPREHENSIVE METABOLIC PANEL WITH GFR
AG Ratio: 1.5 (calc) (ref 1.0–2.5)
ALT: 14 U/L (ref 9–46)
AST: 13 U/L (ref 10–35)
Albumin: 3.9 g/dL (ref 3.6–5.1)
Alkaline phosphatase (APISO): 100 U/L (ref 35–144)
BUN: 12 mg/dL (ref 7–25)
CO2: 26 mmol/L (ref 20–32)
Calcium: 9.1 mg/dL (ref 8.6–10.3)
Chloride: 107 mmol/L (ref 98–110)
Creat: 0.91 mg/dL (ref 0.70–1.22)
Globulin: 2.6 g/dL (ref 1.9–3.7)
Glucose, Bld: 94 mg/dL (ref 65–99)
Potassium: 4.4 mmol/L (ref 3.5–5.3)
Sodium: 139 mmol/L (ref 135–146)
Total Bilirubin: 0.7 mg/dL (ref 0.2–1.2)
Total Protein: 6.5 g/dL (ref 6.1–8.1)
eGFR: 85 mL/min/1.73m2 (ref >=60)

## 2024-05-10 LAB — HEMOGLOBIN A1C
Hgb A1c MFr Bld: 5.5 % (ref <5.7)
Mean Plasma Glucose: 111 mg/dL
eAG (mmol/L): 6.2 mmol/L

## 2024-05-10 NOTE — Progress Notes (Signed)
 CMP normal, hemoglobin A1c normal.

## 2024-05-14 ENCOUNTER — Encounter: Admitting: Physician Assistant

## 2024-05-14 DIAGNOSIS — I10 Essential (primary) hypertension: Secondary | ICD-10-CM | POA: Diagnosis not present

## 2024-05-14 DIAGNOSIS — L97812 Non-pressure chronic ulcer of other part of right lower leg with fat layer exposed: Secondary | ICD-10-CM | POA: Diagnosis not present

## 2024-05-14 DIAGNOSIS — S40811D Abrasion of right upper arm, subsequent encounter: Secondary | ICD-10-CM | POA: Diagnosis not present

## 2024-05-14 DIAGNOSIS — L97212 Non-pressure chronic ulcer of right calf with fat layer exposed: Secondary | ICD-10-CM | POA: Diagnosis not present

## 2024-05-14 DIAGNOSIS — S81811A Laceration without foreign body, right lower leg, initial encounter: Secondary | ICD-10-CM | POA: Diagnosis not present

## 2024-05-15 ENCOUNTER — Ambulatory Visit: Attending: Cardiology | Admitting: Cardiology

## 2024-05-15 ENCOUNTER — Encounter: Payer: Self-pay | Admitting: Cardiology

## 2024-05-15 VITALS — BP 98/58 | HR 95 | Ht 68.0 in | Wt 148.0 lb

## 2024-05-15 DIAGNOSIS — C641 Malignant neoplasm of right kidney, except renal pelvis: Secondary | ICD-10-CM | POA: Diagnosis not present

## 2024-05-15 DIAGNOSIS — R0609 Other forms of dyspnea: Secondary | ICD-10-CM

## 2024-05-15 DIAGNOSIS — I7121 Aneurysm of the ascending aorta, without rupture: Secondary | ICD-10-CM

## 2024-05-15 DIAGNOSIS — R911 Solitary pulmonary nodule: Secondary | ICD-10-CM | POA: Diagnosis not present

## 2024-05-15 DIAGNOSIS — R002 Palpitations: Secondary | ICD-10-CM | POA: Diagnosis not present

## 2024-05-15 DIAGNOSIS — M19211 Secondary osteoarthritis, right shoulder: Secondary | ICD-10-CM | POA: Diagnosis not present

## 2024-05-15 NOTE — Patient Instructions (Addendum)
 Medication Instructions:  Your physician recommends that you continue on your current medications as directed. Please refer to the Current Medication list given to you today.  *If you need a refill on your cardiac medications before your next appointment, please call your pharmacy*   Lab Work: [address] -3rd floor If you have labs (blood work) drawn today and your tests are completely normal, you will receive your results only by: MyChart Message (if you have MyChart) OR A paper copy in the mail If you have any lab test that is abnormal or we need to change your treatment, we will call you to review the results.   Testing/Procedures:  Non-Cardiac CT scanning, (CAT scanning), is a noninvasive, special x-ray that produces cross-sectional images of the body using x-rays and a computer. CT scans help physicians diagnose and treat medical conditions. For some CT exams, a contrast material is used to enhance visibility in the area of the body being studied. CT scans provide greater clarity and reveal more details than regular x-ray exams. hy, a contrast material is injected through an IV to help visualize the blood vessels   WHY IS MY DOCTOR PRESCRIBING ZIO? The Zio system is proven and trusted by physicians to detect and diagnose irregular heart rhythms -- and has been prescribed to hundreds of thousands of patients.  The FDA has cleared the Zio system to monitor for many different kinds of irregular heart rhythms. In a study, physicians were able to reach a diagnosis 90% of the time with the Zio system1.  You can wear the Zio monitor -- a small, discreet, comfortable patch -- during your normal day-to-day activity, including while you sleep, shower, and exercise, while it records every single heartbeat for analysis.  1Barrett, P., et al. Comparison of 24 Hour Holter Monitoring Versus 14 Day Novel Adhesive Patch Electrocardiographic Monitoring. American Journal of Medicine, 2014.  ZIO VS. HOLTER  MONITORING The Zio monitor can be comfortably worn for up to 14 days. Holter monitors can be worn for 24 to 48 hours, limiting the time to record any irregular heart rhythms you may have. Zio is able to capture data for the 51% of patients who have their first symptom-triggered arrhythmia after 48 hours.1  LIVE WITHOUT RESTRICTIONS The Zio ambulatory cardiac monitor is a small, unobtrusive, and water-resistant patch--you might even forget you're wearing it. The Zio monitor records and stores every beat of your heart, whether you're sleeping, working out, or showering.     Follow-Up: At [facility], you and your health needs are our priority.  As part of our continuing mission to provide you with exceptional heart care, we have created designated Provider Care Teams.  These Care Teams include your primary Cardiologist (physician) and Advanced Practice Providers (APPs -  Physician Assistants and Nurse Practitioners) who all work together to provide you with the care you need, when you need it.  We recommend signing up for the patient portal called MyChart.  Sign up information is provided on this After Visit Summary.  MyChart is used to connect with patients for Virtual Visits (Telemedicine).  Patients are able to view lab/test results, encounter notes, upcoming appointments, etc.  Non-urgent messages can be sent to your provider as well.   To learn more about what you can do with MyChart, go to [email or web address].    Your next appointment:   6 month(s)  The format for your next appointment:   In Person  Provider:   Lamar Fitch, MD  Other Instructions NA
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
## 2024-05-15 NOTE — Addendum Note (Signed)
 Addended by: ARLOA PLANAS D on: 05/15/2024 02:07 PM   Modules accepted: Orders

## 2024-05-16 LAB — TSH: TSH: 0.57 u[IU]/mL (ref 0.450–4.500)

## 2024-05-16 LAB — COMPREHENSIVE METABOLIC PANEL WITH GFR
ALT: 16 IU/L (ref 0–44)
AST: 18 IU/L (ref 0–40)
Albumin: 4.2 g/dL (ref 3.8–4.8)
Alkaline Phosphatase: 115 IU/L (ref 44–121)
BUN/Creatinine Ratio: 14 (ref 10–24)
BUN: 17 mg/dL (ref 8–27)
Bilirubin Total: 0.5 mg/dL (ref 0.0–1.2)
CO2: 21 mmol/L (ref 20–29)
Calcium: 9.2 mg/dL (ref 8.6–10.2)
Chloride: 105 mmol/L (ref 96–106)
Creatinine, Ser: 1.22 mg/dL (ref 0.76–1.27)
Globulin, Total: 2.3 g/dL (ref 1.5–4.5)
Glucose: 103 mg/dL — ABNORMAL HIGH (ref 70–99)
Potassium: 4.8 mmol/L (ref 3.5–5.2)
Sodium: 139 mmol/L (ref 134–144)
Total Protein: 6.5 g/dL (ref 6.0–8.5)
eGFR: 60 mL/min/1.73 (ref >59)

## 2024-05-16 LAB — PRO B NATRIURETIC PEPTIDE: NT-Pro BNP: 214 pg/mL (ref 0–486)

## 2024-05-20 ENCOUNTER — Ambulatory Visit: Payer: Self-pay | Admitting: Cardiology

## 2024-05-22 ENCOUNTER — Other Ambulatory Visit: Payer: Self-pay

## 2024-05-22 ENCOUNTER — Other Ambulatory Visit (HOSPITAL_COMMUNITY): Payer: Self-pay

## 2024-05-22 ENCOUNTER — Telehealth: Payer: Self-pay | Admitting: Pharmacist

## 2024-05-22 DIAGNOSIS — M810 Age-related osteoporosis without current pathological fracture: Secondary | ICD-10-CM

## 2024-05-22 MED ORDER — DENOSUMAB 60 MG/ML ~~LOC~~ SOSY
60.0000 mg | PREFILLED_SYRINGE | SUBCUTANEOUS | 0 refills | Status: AC
  Filled 2024-05-22: qty 1, 180d supply, fill #0

## 2024-05-22 NOTE — Progress Notes (Signed)
 Specialty Pharmacy Refill Coordination Note  Brian Leon is a 78 y.o. male assessed today regarding refills of clinic administered specialty medication(s) Denosumab  (PROLIA )   Clinic requested Courier to Provider Office   Delivery date: 05/29/24   Verified address: [address], [place], [address]   Medication will be filled on 05/28/24.    Appointment 06/04/24.

## 2024-05-22 NOTE — Telephone Encounter (Signed)
 Patient due for Prolia  on 06/02/2024. Labs updated on 05/09/2024. Calcium  wnl on 05/15/2024  Patient scheduled for Prolia  on 06/04/2024. Rx sent to [facility] to be couriered to clinic before appt.  Sherry Pennant, PharmD, MPH, BCPS, CPP Clinical Pharmacist (Rheumatology and Pulmonology)

## 2024-05-23 ENCOUNTER — Telehealth: Payer: Self-pay

## 2024-05-23 NOTE — Telephone Encounter (Signed)
 Left message on My Chart with lab results per Dr. Karry note. Routed to PCP

## 2024-05-24 NOTE — Progress Notes (Signed)
 Pharmacy Note  Subjective:   Patient presents to clinic today to receive bi-annual dose of Prolia . Patient's last dose of Prolia  was on 12/05/2023  Patient running a fever or have signs/symptoms of infection? No  Patient currently on antibiotics for the treatment of infection? No  Patient had fall in the last 6 months?  Yes  If yes, did it require medical attention? Yes . He broke his clavicle while in [place] - tripped on an object on ground  Patient taking calcium  1200 mg daily through diet or supplement and at least 800 units vitamin D ? Yes  Objective: CMP     Component Value Date/Time   NA 139 05/15/2024 1417   K 4.8 05/15/2024 1417   CL 105 05/15/2024 1417   CO2 21 05/15/2024 1417   GLUCOSE 103 (H) 05/15/2024 1417   GLUCOSE 94 05/09/2024 0823   BUN 17 05/15/2024 1417   CREATININE 1.22 05/15/2024 1417   CREATININE 0.91 05/09/2024 0823   CALCIUM  9.2 05/15/2024 1417   PROT 6.5 05/15/2024 1417   ALBUMIN 4.2 05/15/2024 1417   AST 18 05/15/2024 1417   ALT 16 05/15/2024 1417   ALKPHOS 115 05/15/2024 1417   BILITOT 0.5 05/15/2024 1417   GFRNONAA >60 01/30/2024 1024   GFRNONAA 67 03/17/2021 1059   GFRAA 77 03/17/2021 1059    CBC    Component Value Date/Time   WBC 8.9 01/30/2024 1024   RBC 3.47 (L) 01/30/2024 1024   HGB 11.5 (L) 01/30/2024 1024   HGB 11.2 (L) 03/30/2023 1157   HGB 11.1 (L) 03/30/2023 1157   HCT 35.2 (L) 01/30/2024 1024   HCT 34.1 (L) 03/30/2023 1157   HCT 31.9 (L) 03/30/2023 1157   PLT 333 01/30/2024 1024   PLT 538 (H) 03/30/2023 1157   PLT 517 (H) 03/30/2023 1157   MCV 101.4 (H) 01/30/2024 1024   MCV 95 03/30/2023 1157   MCV 94 03/30/2023 1157   MCH 33.1 01/30/2024 1024   MCHC 32.7 01/30/2024 1024   RDW 13.9 01/30/2024 1024   RDW 12.2 03/30/2023 1157   RDW 11.9 03/30/2023 1157   LYMPHSABS 2.3 01/30/2024 1024   LYMPHSABS 2.3 03/30/2023 1157   MONOABS 1.0 01/30/2024 1024   EOSABS 0.3 01/30/2024 1024   EOSABS 0.6 (H) 03/30/2023 1157   BASOSABS  0.0 01/30/2024 1024   BASOSABS 0.1 03/30/2023 1157    Lab Results  Component Value Date   VD25OH 32 11/10/2023   DEXA 10/13/2023:The BMD measured at Femur Neck is 0.662 g/cm2 with a T-score of-2.7.DEXA 02/04/21: The BMD Femur Neck Right is 0.645 g/cm2, T-score of -2.8.   Assessment/Plan:   Reviewed importance of adequate dietary intake of calcium  in addition to supplementation due to risk of hypocalcemia with Prolia .   Patient tolerated injection well.   Administration details as below: Administrations This Visit     denosumab  (PROLIA ) injection 60 mg     Admin Date 06/04/2024 Action Given Dose 60 mg Route Subcutaneous Documented By Dayne Sherry RAMAN, RPH-CPP           Patient's next Prolia  dose is due on 12/01/2024.  Patient is due for updated DEXA in January 2027.   All questions encouraged and answered.  Instructed patient to call with any further questions or concerns.   Sanjuanita Dayne, PharmD, MPH, BCPS, CPP Clinical Pharmacist (Rheumatology and Pulmonology)

## 2024-05-27 ENCOUNTER — Encounter: Admitting: Internal Medicine

## 2024-05-27 ENCOUNTER — Ambulatory Visit

## 2024-05-27 ENCOUNTER — Ambulatory Visit: Attending: Cardiology

## 2024-05-27 DIAGNOSIS — I10 Essential (primary) hypertension: Secondary | ICD-10-CM | POA: Diagnosis not present

## 2024-05-27 DIAGNOSIS — S81811A Laceration without foreign body, right lower leg, initial encounter: Secondary | ICD-10-CM | POA: Diagnosis not present

## 2024-05-27 DIAGNOSIS — S51811A Laceration without foreign body of right forearm, initial encounter: Secondary | ICD-10-CM | POA: Diagnosis not present

## 2024-05-27 DIAGNOSIS — R002 Palpitations: Secondary | ICD-10-CM

## 2024-05-27 DIAGNOSIS — S40811D Abrasion of right upper arm, subsequent encounter: Secondary | ICD-10-CM | POA: Diagnosis not present

## 2024-05-27 DIAGNOSIS — L97812 Non-pressure chronic ulcer of other part of right lower leg with fat layer exposed: Secondary | ICD-10-CM | POA: Diagnosis not present

## 2024-05-28 ENCOUNTER — Other Ambulatory Visit: Payer: Self-pay

## 2024-06-04 ENCOUNTER — Ambulatory Visit: Attending: Rheumatology | Admitting: Pharmacist

## 2024-06-04 DIAGNOSIS — Z7689 Persons encountering health services in other specified circumstances: Secondary | ICD-10-CM

## 2024-06-04 DIAGNOSIS — M810 Age-related osteoporosis without current pathological fracture: Secondary | ICD-10-CM

## 2024-06-04 MED ORDER — DENOSUMAB 60 MG/ML ~~LOC~~ SOSY: 60.0000 mg | PREFILLED_SYRINGE | SUBCUTANEOUS | Status: AC

## 2024-06-10 ENCOUNTER — Encounter: Attending: Physician Assistant | Admitting: Physician Assistant

## 2024-06-10 DIAGNOSIS — X58XXXA Exposure to other specified factors, initial encounter: Secondary | ICD-10-CM | POA: Insufficient documentation

## 2024-06-10 DIAGNOSIS — I10 Essential (primary) hypertension: Secondary | ICD-10-CM | POA: Insufficient documentation

## 2024-06-10 DIAGNOSIS — S40811A Abrasion of right upper arm, initial encounter: Secondary | ICD-10-CM | POA: Diagnosis not present

## 2024-06-10 DIAGNOSIS — L97812 Non-pressure chronic ulcer of other part of right lower leg with fat layer exposed: Secondary | ICD-10-CM | POA: Insufficient documentation

## 2024-06-10 DIAGNOSIS — L97822 Non-pressure chronic ulcer of other part of left lower leg with fat layer exposed: Secondary | ICD-10-CM | POA: Diagnosis not present

## 2024-06-10 DIAGNOSIS — L97212 Non-pressure chronic ulcer of right calf with fat layer exposed: Secondary | ICD-10-CM | POA: Diagnosis not present

## 2024-06-10 DIAGNOSIS — S81811A Laceration without foreign body, right lower leg, initial encounter: Secondary | ICD-10-CM | POA: Insufficient documentation

## 2024-06-12 ENCOUNTER — Ambulatory Visit (HOSPITAL_BASED_OUTPATIENT_CLINIC_OR_DEPARTMENT_OTHER)
Admission: RE | Admit: 2024-06-12 | Discharge: 2024-06-12 | Disposition: A | Discharge status: Home / Self Care | Source: Ambulatory Visit | Attending: Cardiology | Admitting: Cardiology

## 2024-06-12 DIAGNOSIS — R911 Solitary pulmonary nodule: Secondary | ICD-10-CM | POA: Diagnosis not present

## 2024-06-12 DIAGNOSIS — I7121 Aneurysm of the ascending aorta, without rupture: Secondary | ICD-10-CM | POA: Diagnosis not present

## 2024-06-12 DIAGNOSIS — S2241XD Multiple fractures of ribs, right side, subsequent encounter for fracture with routine healing: Secondary | ICD-10-CM | POA: Diagnosis not present

## 2024-06-12 DIAGNOSIS — R918 Other nonspecific abnormal finding of lung field: Secondary | ICD-10-CM | POA: Diagnosis not present

## 2024-06-13 DIAGNOSIS — H2512 Age-related nuclear cataract, left eye: Secondary | ICD-10-CM | POA: Diagnosis not present

## 2024-06-17 ENCOUNTER — Encounter: Admitting: Physician Assistant

## 2024-06-17 DIAGNOSIS — S81811D Laceration without foreign body, right lower leg, subsequent encounter: Secondary | ICD-10-CM | POA: Diagnosis not present

## 2024-06-17 DIAGNOSIS — L97812 Non-pressure chronic ulcer of other part of right lower leg with fat layer exposed: Secondary | ICD-10-CM | POA: Diagnosis not present

## 2024-06-17 DIAGNOSIS — S40811A Abrasion of right upper arm, initial encounter: Secondary | ICD-10-CM | POA: Diagnosis not present

## 2024-06-17 DIAGNOSIS — S81811A Laceration without foreign body, right lower leg, initial encounter: Secondary | ICD-10-CM | POA: Diagnosis not present

## 2024-06-17 DIAGNOSIS — I10 Essential (primary) hypertension: Secondary | ICD-10-CM | POA: Diagnosis not present

## 2024-06-17 DIAGNOSIS — R002 Palpitations: Secondary | ICD-10-CM | POA: Diagnosis not present

## 2024-06-17 DIAGNOSIS — L97212 Non-pressure chronic ulcer of right calf with fat layer exposed: Secondary | ICD-10-CM | POA: Diagnosis not present

## 2024-06-21 ENCOUNTER — Other Ambulatory Visit: Payer: Self-pay | Admitting: Cardiology

## 2024-06-27 ENCOUNTER — Telehealth: Payer: Self-pay

## 2024-06-27 NOTE — Telephone Encounter (Signed)
 Left message on My Chart with CT Angio results per Dr. Karry note. Routed to PCP.

## 2024-07-01 DIAGNOSIS — R002 Palpitations: Secondary | ICD-10-CM

## 2024-07-10 ENCOUNTER — Other Ambulatory Visit: Payer: Self-pay | Admitting: Rheumatology

## 2024-07-10 NOTE — Telephone Encounter (Signed)
 Last Fill: 04/11/2024  Next Visit: 11/14/2024  Last Visit: 05/09/2024  DX: Polymyalgia rheumatica   Current Dose per office note on 05/09/2024: prednisone  3 mg p.o. daily.   Okay to refill prednisone ?

## 2024-07-16 ENCOUNTER — Telehealth: Payer: Self-pay

## 2024-07-16 NOTE — Telephone Encounter (Signed)
 Pt viewed CT Chest results on My Chart per Dr. Karry note. Routed to PCP.

## 2024-08-01 ENCOUNTER — Telehealth: Payer: Self-pay

## 2024-08-01 NOTE — Telephone Encounter (Signed)
 Left message on My Chart with monitor results per Dr. Karry note. Routed to PCP

## 2024-08-05 DIAGNOSIS — R399 Unspecified symptoms and signs involving the genitourinary system: Secondary | ICD-10-CM | POA: Diagnosis not present

## 2024-08-05 DIAGNOSIS — R3915 Urgency of urination: Secondary | ICD-10-CM | POA: Diagnosis not present

## 2024-08-05 DIAGNOSIS — C641 Malignant neoplasm of right kidney, except renal pelvis: Secondary | ICD-10-CM | POA: Diagnosis not present

## 2024-08-05 DIAGNOSIS — N200 Calculus of kidney: Secondary | ICD-10-CM | POA: Diagnosis not present

## 2024-08-23 ENCOUNTER — Ambulatory Visit (INDEPENDENT_AMBULATORY_CARE_PROVIDER_SITE_OTHER): Admitting: Family Medicine

## 2024-08-23 ENCOUNTER — Encounter: Payer: Self-pay | Admitting: Family Medicine

## 2024-08-23 VITALS — BP 120/60 | HR 60 | Ht 68.0 in | Wt 148.4 lb

## 2024-08-23 DIAGNOSIS — R911 Solitary pulmonary nodule: Secondary | ICD-10-CM

## 2024-08-23 NOTE — Progress Notes (Unsigned)
    SUBJECTIVE:   CHIEF COMPLAINT / HPI:   Discussed the use of AI scribe software for clinical note transcription with the patient, who gave verbal consent to proceed.  History of Present Illness Brian Leon is an 78 year old male with renal cell carcinoma who presents for follow-up after a cardiology scan. He is accompanied by his wife.  Lung Nodule - Had CT chest with cardiologist that incidentally showed new cavitary nodule in RUL  - Increased cough over the past month, more frequent than baseline - Cough is productive of  yellow mucus - Occasional small amount of blood present in nasal discharge, but not in sputum - No associated fever, night sweats, or significant weight changes - Occasional pain in the right upper chest area  Oncologic history - Renal cell carcinoma with partial nephrectomy - History of skin cancer with lesions removed from face  Tobacco use history - Significant smoking history: one pack per day for 10-11 years - Quit smoking approximately 40-45 years ago  Infectious exposure - No recent exposure to sick individuals except for wife, who has been coughing for the past week    OBJECTIVE:   BP (!) 140/60   Pulse 60   Ht 5' 8 (1.727 m)   Wt 148 lb 6 oz (67.3 kg)   SpO2 98%   BMI 22.56 kg/m   General: Alert, pleasant older man speaking comfortably on RA. NAD. HEENT: NCAT. MMM. CV: RRR, no murmurs. Cap refill <2. Resp: CTAB, no wheezing or crackles. Normal WOB on RA.  Abm: Soft, nontender, nondistended. BS present. Ext: Moves all ext spontaneously Skin: Warm, well perfused   ASSESSMENT/PLAN:   Assessment & Plan Lesion of lung Incidentally noted new cavitary lesion in RUL from chest CT w/ Cardiologist. No red flag symptoms such as weight loss, hemoptysis, dyspnea, night sweats. C/f malignancy or TB. - Referral to Pulmonology for further evaluation - Discussed return and red flag signs    Twyla Nearing, MD [facility]

## 2024-08-23 NOTE — Assessment & Plan Note (Addendum)
 Incidentally noted new cavitary lesion in RUL from chest CT w/ Cardiologist. No red flag symptoms such as weight loss, hemoptysis, dyspnea, night sweats. C/f malignancy or TB. - Referral to Pulmonology for further evaluation - Discussed return and red flag signs

## 2024-08-23 NOTE — Patient Instructions (Signed)
 1) I am sending you to see a Pulmonologist to take a closer look at your lung lesion. It is still pretty small. - Expect a phone call to schedule that.   In the meantime, I want you to be on the lookout for: - coughing up blood - weight loss - night sweats, waking up in middle of night drenched in sweat - worsening shortness of breath

## 2024-09-12 ENCOUNTER — Encounter: Payer: Self-pay | Admitting: Emergency Medicine

## 2024-09-12 ENCOUNTER — Ambulatory Visit: Admitting: Emergency Medicine

## 2024-09-12 VITALS — BP 118/66 | HR 63 | Ht 68.0 in | Wt 147.0 lb

## 2024-09-12 DIAGNOSIS — J4489 Other specified chronic obstructive pulmonary disease: Secondary | ICD-10-CM | POA: Diagnosis not present

## 2024-09-12 DIAGNOSIS — R918 Other nonspecific abnormal finding of lung field: Secondary | ICD-10-CM

## 2024-09-12 DIAGNOSIS — Z87891 Personal history of nicotine dependence: Secondary | ICD-10-CM | POA: Diagnosis not present

## 2024-09-12 NOTE — Progress Notes (Signed)
 Subjective:    Patient ID: Brian Leon, male    DOB: 1946-04-28, 78 y.o.   MRN: [id number]  HPI Discussed the use of AI scribe software for clinical note transcription with the patient, who gave verbal consent to proceed.  History of Present Illness Brian Leon is an 78 year old male with renal cell carcinoma and COPD who presents for evaluation of pulmonary nodules. He was referred by his primary care doctor for evaluation of pulmonary nodules.  He has a history of renal cell carcinoma, treated with a partial nephrectomy in January 2016. Recent CT scans have shown multiple pulmonary nodules, including a stable 2.2 cm nodule in the anterior left upper lobe, a 1.2 cm adjacent nodule, a 5 mm nodule in the left upper lobe that has decreased in size, and a 6 mm nodule in the right lower lobe that has also decreased. A new 1 cm nodule with eccentric cavitary change was noted in the right middle lobe. These nodules have been monitored over time, with some showing stability and others decreasing in size.  He has chronic COPD and asthma with documented obstruction on pulmonary function testing. His asthma is well-managed with Advair  and has improved significantly since starting Dupixent  four to five years ago, initially for eczema. His asthma symptoms have been more controlled since starting Dupixent .  He has polymyalgia rheumatica and is on low dose prednisone .  He has a history of hypertension, IBS, and hyperlipidemia. He is a former smoker, having quit smoking 40 years ago after smoking for three years. He has been on inhaler medications since his early twenties.  He has undergone CT scans to follow an aortic aneurysm, during which the pulmonary nodules were identified. 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 He has no significant occupational exposures to asbestos or animals, and he recently addressed minor mold issues in his  home.  Multiple CT scans of the chest reviewed and interpreted by me including most recent on 06/12/2024.  Results RADIOLOGY Chest CT: Subcentimeter nodes in the mediastinum. Pulmonary nodules stable in size, including a 2.2 cm nodule in the anterior left upper lobe, adjacent 1.2 cm nodule in the left upper lobe, 5 mm nodule in the left upper lobe decreased from prior, 6 mm nodule in the right lower lobe decreased from prior. New 1 cm nodule in the right middle lobe with eccentric cavitary change. Associated mucoid impaction. (06/12/2024)    Review of Systems As per HPI  Past Medical History:  Diagnosis Date   Abdominal mass 03/14/2018   July 2018 CT: Fatty lesion within the left lateral abdominal musculature (obliques muscles/transversalis muscle) has increased slightly in size since 2015. Fatty lesion currently measures 10.4 x 4.7 x 4.6 cm versus 9.1 x 3.4 x 4.2 cm in 2015 and contains a few septations. This may represent a lipoma. Very low-grade liposarcoma cannot be entirely excluded.  Following with [facility]   Allergic rhinitis    Anemia    Arthritis    Asthma    PFT 2009 showed mod to severe with good reversibility with albuterol    Asthma, chronic 11/30/2006   Qualifier: Diagnosis of  By: Sharron Railing     BENIGN PROSTATIC HYPERTROPHY, HX OF 06/17/2008   Qualifier: Diagnosis of  By: [place]  MD, Stephanie     Bilateral ureteral calculi    Blind right eye    WEARS PROSTHESIS   C. difficile diarrhea  04/2022   Clavicle fracture    Complication of anesthesia    woke up before hernia surgery complete 2012   Displacement of cervical intervertebral disc 03/27/2015   Dysphagia 03/25/2020   Eczema    Erectile dysfunction 10/10/2018   Essential (primary) hypertension 10/14/2019   Family history of adverse reaction to anesthesia    anesthesia made his mother crazy   Headache    due to neck pain   Hepatitis    hx of   Herniated nucleus pulposus, lumbar 10/14/2019    History of bladder stone    History of irritable bowel syndrome    History of kidney stones    Hypercholesteremia 08/22/2018   Hypertension    Long term (current) use of systemic steroids 01/04/2018   Low back pain 09/09/2019   Lung nodule 05/21/2017   Need for immunization against influenza 08/05/2019   Non-recurrent unilateral inguinal hernia without obstruction or gangrene 12/13/2010   Right side    Nonhealing nonsurgical wound 07/03/2018   Osteoarthritis of spine with radiculopathy, 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   Osteoporosis    Paraureteric diverticulum    BILATERAL   Pneumonia    Polymyalgia rheumatica 05/21/2017   Prosthetic eye globe    right eye   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   right, injury   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HCC)    s/p R partial nephrectomy 10/2014, pT1b papillary type 1 tumor   Renal cyst, right    COMPLEX   Screening for colon cancer 02/19/2014   Seasonal allergies    Thoracic aortic aneurysm 05/21/2017   Thrombocytosis 04/12/2017   Vitamin D  deficiency 06/11/2009   Qualifier: Diagnosis of  By: [place]  MD, Corean      Family History  Problem Relation Age of Onset   Cancer Mother    Liver disease Father        cirrhosis 2/2 etoh   Pancreatic cancer Sister    Stroke Sister        aneurysm     Social History   Socioeconomic History   Marital status: Married    Spouse name: Not on file   Number of children: 3   Years of education: Not on file   Highest education level: Not on file  Occupational History   Occupation: Retired  Tobacco Use   Smoking status: Former    Current packs/day: 0.00    Average packs/day: 1 pack/day for 20.0 years (20.0 ttl pk-yrs)    Types: Cigarettes    Start date: 12/13/1963    Quit date: 12/13/1983    Years since quitting: 40.7    Passive exposure: Past   Smokeless tobacco: Never  Vaping Use   Vaping status: Never Used  Substance and Sexual Activity   Alcohol use: No    Comment: quit drinking 30 years ago    Drug use: Not Currently    Types: Marijuana    Comment: remote use of cocaine, heroin, acid, mushrooms in the 1970s. none since   Sexual activity: Not on file  Other Topics Concern   Not on file  Social History Narrative   Not on file   Social Drivers of Health   Tobacco Use: Medium Risk (09/12/2024)   Patient History    Smoking Tobacco Use: Former    Smokeless Tobacco Use: Never    Passive Exposure: Past  Physicist, Medical Strain: Low Risk (02/01/2024)   Overall Financial Resource Strain (CARDIA)    Difficulty of Paying Living Expenses: Not hard at all  Food Insecurity: No Food Insecurity (02/01/2024)   Hunger Vital Sign    Worried About Running Out of Food in the Last Year: Never true    Ran Out of Food in the Last Year: Never true  Transportation Needs: No Transportation Needs (02/01/2024)   PRAPARE - Administrator, Civil Service (Medical): No    Lack of Transportation (Non-Medical): No  Physical Activity: Insufficiently Active (02/01/2024)   Exercise Vital Sign    Days of Exercise per Week: 1 day    Minutes of Exercise per Session: 30 min  Stress: No Stress Concern Present (02/01/2024)   [employer] of Occupational Health - Occupational Stress Questionnaire    Feeling of Stress : Not at all  Social Connections: Moderately Isolated (02/01/2024)   Social Connection and Isolation Panel    Frequency of Communication with Friends and Family: Three times a week    Frequency of Social Gatherings with Friends and Family: Three times a week    Attends Religious Services: Never    Active Member of Clubs or Organizations: No    Attends Banker Meetings: Never    Marital Status: Married  Catering Manager Violence: Not At Risk (02/01/2024)   Humiliation, Afraid, Rape, and Kick questionnaire    Fear of Current or Ex-Partner: No    Emotionally Abused: No    Physically Abused: No    Sexually Abused: No  Depression (PHQ2-9): Low Risk (04/25/2024)   Depression  (PHQ2-9)    PHQ-2 Score: 0  Alcohol Screen: Low Risk (02/01/2024)   Alcohol Screen    Last Alcohol Screening Score (AUDIT): 0  Housing: Low Risk (02/01/2024)   Housing Stability Vital Sign    Unable to Pay for Housing in the Last Year: No    Number of Times Moved in the Last Year: 0    Homeless in the Last Year: No  Utilities: Not At Risk (02/01/2024)   AHC Utilities    Threatened with loss of utilities: No  Health Literacy: Adequate Health Literacy (02/01/2024)   B1300 Health Literacy    Frequency of need for help with medical instructions: Never    Allergies[1]  Medications Ordered Prior to Encounter[2]     Objective:    Vitals:   09/12/24 0923  BP: 118/66  Pulse: 63  SpO2: 97%  Weight: 147 lb (66.7 kg)  Height: 5' 8 (1.727 m)   Physical Exam Gen: Pleasant, well-nourished, in no distress,  normal affect  ENT: No lesions,  mouth clear,  oropharynx clear, no postnasal drip  Neck: No JVD, no stridor  Lungs: No use of accessory muscles, no crackles or wheezing on normal respiration, no wheeze on forced expiration  Cardiovascular: RRR, heart sounds normal, no murmur or gallops, no peripheral edema  Musculoskeletal: No deformities, no cyanosis or clubbing  Neuro: alert, awake, non focal  Skin: Warm, no lesions or rashes       Assessment & Plan:   Assessment & Plan Pulmonary nodules   Assessment & Plan Pulmonary nodules Multiple nodules on CT, including a new 1 cm right middle lobe nodule with cavitary change. Decreased size in some nodules suggests non-malignant process. Low malignancy suspicion due to stability and changes.  Discussed the differential diagnosis with him including possible chronic colonization with Mycobacterium avium or other organism, noninfectious inflammatory process, less likely malignancy. - Follow-up CT scan in March to monitor nodule stability. - Consider bronchoscopy if significant changes on imaging or symptoms develop.  Chronic  obstructive pulmonary  disease with asthma Chronic COPD with obstruction on pulmonary function tests. Asthma well-managed with Advair  and Dupixent , improved symptoms with Dupixent . - Continue Advair  and Dupixent . - He follows with an allergist   Return in about 3 months (around 12/25/2024) for With Dr. Shelah, To review CT scan of the chest.   Lamar Shelah, MD, PhD 09/12/2024, 11:36 AM [facility] [phone] or if no answer before 7:00PM call [phone] For any issues after 7:00PM please call eLink [phone]     [1]  Allergies Allergen Reactions   Sulfa Drugs Cross Reactors Other (See Comments)    Flu like symptoms   Acetaminophen  Other (See Comments)    Flu like symptoms  [2]  Current Outpatient Medications on File Prior to Visit  Medication Sig Dispense Refill   albuterol  (VENTOLIN  HFA) 108 (90 Base) MCG/ACT inhaler TAKE 2 PUFFS BY MOUTH EVERY 6 HOURS AS NEEDED FOR WHEEZE OR SHORTNESS OF BREATH 18 each 3   atorvastatin  (LIPITOR) 10 MG tablet TAKE 1 TABLET BY MOUTH EVERY DAY 90 tablet 3   denosumab  (PROLIA ) 60 MG/ML SOSY injection Inject 60 mg into the skin every 6 (six) months. Courier to rheum: [address], [place] [address]. Appt on 06/04/2024 1 mL 0   DUPIXENT  300 MG/2ML SOSY Inject 300 mg into the skin every 14 (fourteen) days. Every other Monday  3   finasteride  (PROSCAR ) 5 MG tablet Take 5 mg by mouth daily.     fluticasone  (FLONASE ) 50 MCG/ACT nasal spray Place 2 sprays into both nostrils 2 (two) times daily. 48 mL 4   fluticasone -salmeterol (ADVAIR  DISKUS) 500-50 MCG/ACT AEPB Inhale 1 puff into the lungs in the morning and at bedtime. 180 each 4   losartan  (COZAAR ) 50 MG tablet TAKE 1 TABLET BY MOUTH EVERY DAY 90 tablet 3   pantoprazole  (PROTONIX ) 20 MG tablet Take 1 tablet (20 mg total) by mouth daily. 90 tablet 3   predniSONE  (DELTASONE ) 1 MG tablet TAKE 3 TABLETS (3 MG TOTAL) BY MOUTH DAILY WITH BREAKFAST. 270 tablet 0    Current Facility-Administered Medications on File Prior to Visit  Medication Dose Route Frequency Provider Last Rate Last Admin   [START ON 11/27/2024] denosumab  (PROLIA ) injection 60 mg  60 mg Subcutaneous Q6 months

## 2024-09-12 NOTE — Patient Instructions (Signed)
°  VISIT SUMMARY: You came in today for an evaluation of your pulmonary nodules. We discussed your history of renal cell carcinoma, COPD, and asthma, and reviewed your recent CT scans which showed multiple pulmonary nodules. Some of these nodules have remained stable or decreased in size, and a new nodule was noted. We also reviewed your current medications and overall health status.  YOUR PLAN: -PULMONARY NODULES: Pulmonary nodules are small growths in the lungs. Your recent CT scans showed multiple nodules, some of which have decreased in size, suggesting they are likely not cancerous. We will do a follow-up CT scan in March to monitor their stability. If there are significant changes or if you develop symptoms, we may consider a bronchoscopy, which is a procedure to look inside your lungs.  -CHRONIC OBSTRUCTIVE PULMONARY DISEASE (COPD) WITH ASTHMA: COPD is a chronic lung disease that makes it hard to breathe, and asthma is a condition where your airways narrow and swell. Your asthma is well-managed with Advair  and Dupixent , and your symptoms have improved significantly. Continue taking Advair  and Dupixent  as prescribed.  INSTRUCTIONS: Please schedule a follow-up CT scan in March to monitor the stability of your pulmonary nodules. Continue taking your medications as prescribed. If you notice any new symptoms or changes in your health, contact our office immediately.

## 2024-09-30 ENCOUNTER — Other Ambulatory Visit: Payer: Self-pay

## 2024-09-30 DIAGNOSIS — R058 Other specified cough: Secondary | ICD-10-CM

## 2024-09-30 MED ORDER — ALBUTEROL SULFATE HFA 108 (90 BASE) MCG/ACT IN AERS: 2.0000 | INHALATION_SPRAY | Freq: Four times a day (QID) | RESPIRATORY_TRACT | 3 refills | Status: AC | PRN

## 2024-11-04 ENCOUNTER — Telehealth: Payer: Self-pay | Admitting: Family Medicine

## 2024-11-04 ENCOUNTER — Other Ambulatory Visit: Payer: Self-pay | Admitting: Rheumatology

## 2024-11-14 ENCOUNTER — Ambulatory Visit: Admitting: Rheumatology

## 2024-11-28 ENCOUNTER — Ambulatory Visit: Admitting: Rheumatology

## 2024-12-09 ENCOUNTER — Other Ambulatory Visit

## 2024-12-11 ENCOUNTER — Ambulatory Visit: Admitting: Emergency Medicine

## 2025-02-06 ENCOUNTER — Encounter
